# Patient Record
Sex: Female | Born: 1955 | Race: White | Hispanic: No | Marital: Married | Smoking: Former smoker
Health system: Southern US, Community
[De-identification: ages and names within clinical notes are randomized; demographics above are authoritative.]

## PROBLEM LIST (undated history)

## (undated) DIAGNOSIS — D649 Anemia, unspecified: Secondary | ICD-10-CM

## (undated) DIAGNOSIS — R0609 Other forms of dyspnea: Secondary | ICD-10-CM

## (undated) DIAGNOSIS — E785 Hyperlipidemia, unspecified: Secondary | ICD-10-CM

## (undated) DIAGNOSIS — F319 Bipolar disorder, unspecified: Secondary | ICD-10-CM

## (undated) DIAGNOSIS — C44201 Unspecified malignant neoplasm of skin of unspecified ear and external auricular canal: Secondary | ICD-10-CM

## (undated) DIAGNOSIS — M797 Fibromyalgia: Secondary | ICD-10-CM

## (undated) DIAGNOSIS — G2581 Restless legs syndrome: Secondary | ICD-10-CM

## (undated) DIAGNOSIS — N879 Dysplasia of cervix uteri, unspecified: Secondary | ICD-10-CM

## (undated) DIAGNOSIS — G4700 Insomnia, unspecified: Secondary | ICD-10-CM

## (undated) DIAGNOSIS — M1990 Unspecified osteoarthritis, unspecified site: Secondary | ICD-10-CM

## (undated) DIAGNOSIS — D759 Disease of blood and blood-forming organs, unspecified: Secondary | ICD-10-CM

## (undated) DIAGNOSIS — G629 Polyneuropathy, unspecified: Secondary | ICD-10-CM

## (undated) DIAGNOSIS — F329 Major depressive disorder, single episode, unspecified: Secondary | ICD-10-CM

## (undated) DIAGNOSIS — F419 Anxiety disorder, unspecified: Secondary | ICD-10-CM

## (undated) DIAGNOSIS — F411 Generalized anxiety disorder: Secondary | ICD-10-CM

## (undated) DIAGNOSIS — M545 Low back pain, unspecified: Secondary | ICD-10-CM

## (undated) DIAGNOSIS — I2510 Atherosclerotic heart disease of native coronary artery without angina pectoris: Secondary | ICD-10-CM

## (undated) DIAGNOSIS — E119 Type 2 diabetes mellitus without complications: Secondary | ICD-10-CM

## (undated) DIAGNOSIS — Z8489 Family history of other specified conditions: Secondary | ICD-10-CM

## (undated) DIAGNOSIS — N189 Chronic kidney disease, unspecified: Secondary | ICD-10-CM

## (undated) DIAGNOSIS — I10 Essential (primary) hypertension: Secondary | ICD-10-CM

## (undated) DIAGNOSIS — G894 Chronic pain syndrome: Secondary | ICD-10-CM

## (undated) DIAGNOSIS — F1190 Opioid use, unspecified, uncomplicated: Secondary | ICD-10-CM

## (undated) DIAGNOSIS — E039 Hypothyroidism, unspecified: Secondary | ICD-10-CM

## (undated) DIAGNOSIS — F418 Other specified anxiety disorders: Secondary | ICD-10-CM

## (undated) DIAGNOSIS — F32A Depression, unspecified: Secondary | ICD-10-CM

## (undated) DIAGNOSIS — E871 Hypo-osmolality and hyponatremia: Secondary | ICD-10-CM

## (undated) DIAGNOSIS — G43909 Migraine, unspecified, not intractable, without status migrainosus: Secondary | ICD-10-CM

## (undated) DIAGNOSIS — D6800 Von Willebrand disease, unspecified: Secondary | ICD-10-CM

## (undated) DIAGNOSIS — I209 Angina pectoris, unspecified: Secondary | ICD-10-CM

## (undated) DIAGNOSIS — R0600 Dyspnea, unspecified: Secondary | ICD-10-CM

## (undated) DIAGNOSIS — R160 Hepatomegaly, not elsewhere classified: Secondary | ICD-10-CM

## (undated) DIAGNOSIS — C801 Malignant (primary) neoplasm, unspecified: Secondary | ICD-10-CM

## (undated) DIAGNOSIS — J189 Pneumonia, unspecified organism: Secondary | ICD-10-CM

## (undated) DIAGNOSIS — Z794 Long term (current) use of insulin: Secondary | ICD-10-CM

## (undated) DIAGNOSIS — J449 Chronic obstructive pulmonary disease, unspecified: Secondary | ICD-10-CM

## (undated) DIAGNOSIS — K219 Gastro-esophageal reflux disease without esophagitis: Secondary | ICD-10-CM

## (undated) HISTORY — DX: Hepatomegaly, not elsewhere classified: R16.0

## (undated) HISTORY — DX: Von Willebrand disease, unspecified: D68.00

## (undated) HISTORY — DX: Major depressive disorder, single episode, unspecified: F32.9

## (undated) HISTORY — PX: BREAST IMPLANT REMOVAL: SUR1101

## (undated) HISTORY — DX: Essential (primary) hypertension: I10

## (undated) HISTORY — DX: Insomnia, unspecified: G47.00

## (undated) HISTORY — DX: Polyneuropathy, unspecified: G62.9

## (undated) HISTORY — DX: Depression, unspecified: F32.A

## (undated) HISTORY — PX: CARDIAC CATHETERIZATION: SHX172

## (undated) HISTORY — DX: Malignant (primary) neoplasm, unspecified: C80.1

## (undated) HISTORY — DX: Low back pain: M54.5

## (undated) HISTORY — DX: Migraine, unspecified, not intractable, without status migrainosus: G43.909

## (undated) HISTORY — DX: Other specified anxiety disorders: F41.8

## (undated) HISTORY — DX: Bipolar disorder, unspecified: F31.9

## (undated) HISTORY — PX: COLONOSCOPY: SHX174

## (undated) HISTORY — PX: LUMBAR FUSION: SHX111

## (undated) HISTORY — DX: Gastro-esophageal reflux disease without esophagitis: K21.9

## (undated) HISTORY — PX: CORONARY STENT PLACEMENT: SHX1402

## (undated) HISTORY — DX: Hyperlipidemia, unspecified: E78.5

## (undated) HISTORY — DX: Generalized anxiety disorder: F41.1

## (undated) HISTORY — DX: Anxiety disorder, unspecified: F41.9

## (undated) HISTORY — PX: ABDOMINAL HYSTERECTOMY: SHX81

## (undated) HISTORY — DX: Low back pain, unspecified: M54.50

## (undated) HISTORY — DX: Von Willebrand's disease: D68.0

## (undated) HISTORY — DX: Atherosclerotic heart disease of native coronary artery without angina pectoris: I25.10

---

## 1983-10-29 DIAGNOSIS — D649 Anemia, unspecified: Secondary | ICD-10-CM

## 1983-10-29 DIAGNOSIS — D500 Iron deficiency anemia secondary to blood loss (chronic): Secondary | ICD-10-CM

## 1983-10-29 HISTORY — PX: TOTAL ABDOMINAL HYSTERECTOMY W/ BILATERAL SALPINGOOPHORECTOMY: SHX83

## 1983-10-29 HISTORY — DX: Iron deficiency anemia secondary to blood loss (chronic): D50.0

## 1983-10-29 HISTORY — DX: Anemia, unspecified: D64.9

## 1985-10-28 HISTORY — PX: PLACEMENT OF BREAST IMPLANTS: SHX6334

## 1991-10-29 HISTORY — PX: BREAST IMPLANT REMOVAL: SUR1101

## 1995-10-29 HISTORY — PX: TURBINATE REDUCTION: SHX6157

## 1995-10-29 HISTORY — PX: NASAL SINUS SURGERY: SHX719

## 2004-07-16 ENCOUNTER — Other Ambulatory Visit: Payer: Self-pay

## 2004-07-17 ENCOUNTER — Other Ambulatory Visit: Payer: Self-pay

## 2004-10-18 ENCOUNTER — Ambulatory Visit: Payer: Self-pay | Admitting: Family Medicine

## 2004-11-01 ENCOUNTER — Ambulatory Visit: Payer: Self-pay | Admitting: Internal Medicine

## 2005-03-20 ENCOUNTER — Ambulatory Visit: Payer: Self-pay | Admitting: Pain Medicine

## 2005-04-01 ENCOUNTER — Ambulatory Visit: Payer: Self-pay | Admitting: Pain Medicine

## 2005-04-24 ENCOUNTER — Ambulatory Visit: Payer: Self-pay | Admitting: Family Medicine

## 2005-05-07 ENCOUNTER — Ambulatory Visit: Payer: Self-pay | Admitting: Pain Medicine

## 2005-05-15 ENCOUNTER — Ambulatory Visit: Payer: Self-pay | Admitting: Pain Medicine

## 2005-07-25 ENCOUNTER — Ambulatory Visit: Payer: Self-pay | Admitting: Pain Medicine

## 2005-07-31 ENCOUNTER — Ambulatory Visit: Payer: Self-pay | Admitting: Pain Medicine

## 2005-09-10 ENCOUNTER — Ambulatory Visit: Payer: Self-pay | Admitting: Pain Medicine

## 2005-10-24 ENCOUNTER — Ambulatory Visit: Payer: Self-pay | Admitting: Physician Assistant

## 2005-10-28 HISTORY — PX: LUMBAR FUSION: SHX111

## 2005-10-28 HISTORY — PX: BACK SURGERY: SHX140

## 2005-10-28 HISTORY — PX: CORONARY ANGIOPLASTY WITH STENT PLACEMENT: SHX49

## 2006-01-15 ENCOUNTER — Other Ambulatory Visit: Payer: Self-pay

## 2006-01-23 ENCOUNTER — Inpatient Hospital Stay: Payer: Self-pay | Admitting: Unknown Physician Specialty

## 2006-02-06 ENCOUNTER — Emergency Department: Payer: Self-pay | Admitting: Emergency Medicine

## 2006-08-25 ENCOUNTER — Ambulatory Visit: Payer: Self-pay | Admitting: Internal Medicine

## 2006-09-01 ENCOUNTER — Inpatient Hospital Stay (HOSPITAL_COMMUNITY): Admission: RE | Admit: 2006-09-01 | Discharge: 2006-09-02 | Payer: Self-pay | Admitting: Cardiology

## 2006-09-03 ENCOUNTER — Ambulatory Visit: Payer: Self-pay | Admitting: Internal Medicine

## 2006-09-27 ENCOUNTER — Ambulatory Visit: Payer: Self-pay | Admitting: Internal Medicine

## 2006-11-13 ENCOUNTER — Ambulatory Visit: Payer: Self-pay | Admitting: Unknown Physician Specialty

## 2007-03-12 ENCOUNTER — Encounter: Payer: Self-pay | Admitting: Cardiovascular Disease

## 2007-03-13 ENCOUNTER — Encounter: Payer: Self-pay | Admitting: Cardiovascular Disease

## 2007-06-19 ENCOUNTER — Ambulatory Visit: Payer: Self-pay | Admitting: Physician Assistant

## 2007-08-06 ENCOUNTER — Ambulatory Visit: Payer: Self-pay | Admitting: Pain Medicine

## 2007-09-07 ENCOUNTER — Ambulatory Visit: Payer: Self-pay | Admitting: Pain Medicine

## 2007-10-15 ENCOUNTER — Ambulatory Visit: Payer: Self-pay | Admitting: Pain Medicine

## 2007-11-02 ENCOUNTER — Ambulatory Visit: Payer: Self-pay | Admitting: Pain Medicine

## 2007-12-02 ENCOUNTER — Ambulatory Visit: Payer: Self-pay | Admitting: Pain Medicine

## 2007-12-09 ENCOUNTER — Ambulatory Visit: Payer: Self-pay | Admitting: Pain Medicine

## 2007-12-11 ENCOUNTER — Ambulatory Visit: Payer: Self-pay | Admitting: Pain Medicine

## 2007-12-29 ENCOUNTER — Ambulatory Visit: Payer: Self-pay | Admitting: Pain Medicine

## 2008-01-06 ENCOUNTER — Ambulatory Visit: Payer: Self-pay | Admitting: Pain Medicine

## 2008-01-28 ENCOUNTER — Ambulatory Visit: Payer: Self-pay | Admitting: Pain Medicine

## 2008-07-25 ENCOUNTER — Ambulatory Visit: Payer: Self-pay | Admitting: Physician Assistant

## 2008-09-02 ENCOUNTER — Ambulatory Visit: Payer: Self-pay | Admitting: Family Medicine

## 2008-09-20 ENCOUNTER — Ambulatory Visit: Payer: Self-pay | Admitting: Specialist

## 2009-01-11 ENCOUNTER — Encounter: Payer: Self-pay | Admitting: Cardiovascular Disease

## 2009-01-11 ENCOUNTER — Ambulatory Visit (HOSPITAL_COMMUNITY): Admission: RE | Admit: 2009-01-11 | Discharge: 2009-01-11 | Payer: Self-pay | Admitting: Cardiovascular Disease

## 2009-07-06 ENCOUNTER — Encounter: Payer: Self-pay | Admitting: Cardiovascular Disease

## 2010-06-13 ENCOUNTER — Ambulatory Visit: Payer: Self-pay | Admitting: Cardiovascular Disease

## 2010-06-13 DIAGNOSIS — I2510 Atherosclerotic heart disease of native coronary artery without angina pectoris: Secondary | ICD-10-CM

## 2010-06-13 DIAGNOSIS — R079 Chest pain, unspecified: Secondary | ICD-10-CM

## 2010-06-13 DIAGNOSIS — E785 Hyperlipidemia, unspecified: Secondary | ICD-10-CM

## 2010-06-14 ENCOUNTER — Encounter: Payer: Self-pay | Admitting: Cardiovascular Disease

## 2010-06-14 DIAGNOSIS — F17200 Nicotine dependence, unspecified, uncomplicated: Secondary | ICD-10-CM | POA: Insufficient documentation

## 2010-06-18 ENCOUNTER — Ambulatory Visit: Payer: Self-pay | Admitting: Cardiovascular Disease

## 2010-06-19 ENCOUNTER — Encounter: Payer: Self-pay | Admitting: Cardiovascular Disease

## 2010-06-19 LAB — CONVERTED CEMR LAB
BUN: 14 mg/dL (ref 6–23)
Basophils Absolute: 0 10*3/uL (ref 0.0–0.1)
Basophils Relative: 0 % (ref 0–1)
CO2: 27 meq/L (ref 19–32)
Chloride: 98 meq/L (ref 96–112)
Eosinophils Absolute: 0.2 10*3/uL (ref 0.0–0.7)
Glucose, Bld: 185 mg/dL — ABNORMAL HIGH (ref 70–99)
HCT: 44 % (ref 36.0–46.0)
Hemoglobin: 13.8 g/dL (ref 12.0–15.0)
INR: 0.95 (ref <1.50)
Lymphocytes Relative: 25 % (ref 12–46)
MCV: 94.2 fL (ref 78.0–100.0)
Potassium: 4.7 meq/L (ref 3.5–5.3)
RBC: 4.67 M/uL (ref 3.87–5.11)
Sodium: 137 meq/L (ref 135–145)
WBC: 7.6 10*3/uL (ref 4.0–10.5)
aPTT: 30 s (ref 24–37)

## 2010-06-20 ENCOUNTER — Ambulatory Visit: Payer: Self-pay | Admitting: Cardiovascular Disease

## 2010-06-20 HISTORY — PX: CORONARY ANGIOPLASTY WITH STENT PLACEMENT: SHX49

## 2010-06-21 ENCOUNTER — Encounter: Payer: Self-pay | Admitting: Cardiovascular Disease

## 2010-06-25 ENCOUNTER — Telehealth: Payer: Self-pay | Admitting: Cardiovascular Disease

## 2010-06-28 ENCOUNTER — Telehealth: Payer: Self-pay | Admitting: Cardiovascular Disease

## 2010-07-05 ENCOUNTER — Encounter: Payer: Self-pay | Admitting: Cardiovascular Disease

## 2010-07-19 ENCOUNTER — Ambulatory Visit: Payer: Self-pay | Admitting: Cardiovascular Disease

## 2010-07-25 ENCOUNTER — Encounter: Payer: Self-pay | Admitting: Cardiovascular Disease

## 2010-10-26 ENCOUNTER — Ambulatory Visit: Payer: Self-pay | Admitting: Family Medicine

## 2010-10-28 ENCOUNTER — Ambulatory Visit: Payer: Self-pay | Admitting: Family Medicine

## 2010-11-27 NOTE — Letter (Signed)
Summary: PHI  PHI   Imported By: Harlon Flor 06/14/2010 16:09:45  _____________________________________________________________________  External Attachment:    Type:   Image     Comment:   External Document

## 2010-11-27 NOTE — Miscellaneous (Signed)
Summary: Heart Track  Heart Track   Imported By: Harlon Flor 07/16/2010 13:23:35  _____________________________________________________________________  External Attachment:    Type:   Image     Comment:   External Document

## 2010-11-27 NOTE — Cardiovascular Report (Signed)
[facility]   Imported By: Harlon Flor 06/20/2010 16:35:46  _____________________________________________________________________  External Attachment:    Type:   Image     Comment:   External Document

## 2010-11-27 NOTE — Cardiovascular Report (Signed)
Summary: Consent Form  Consent Form   Imported By: Harlon Flor 07/16/2010 13:22:34  _____________________________________________________________________  External Attachment:    Type:   Image     Comment:   External Document

## 2010-11-27 NOTE — Letter (Signed)
Summary: [facility] - Cardiac Cath  [facility] - Cardiac Cath   Imported By: Marylou Mccoy 08/21/2010 13:36:03  _____________________________________________________________________  External Attachment:    Type:   Image     Comment:   External Document

## 2010-11-27 NOTE — Letter (Signed)
Summary: [facility] - No-Show Fax  [facility] - No-Show Fax   Imported By: Marylou Mccoy 08/08/2010 10:44:33  _____________________________________________________________________  External Attachment:    Type:   Image     Comment:   External Document

## 2010-11-27 NOTE — Assessment & Plan Note (Signed)
Summary: NP6/AMD   Visit Type:  Initial Consult Primary Jennifer [name]:  Jennifer [name].  CC:  c/o chest pain and shortness of breath and dizziness. She states had a spell about two weeks ago of  tightening in her chest while driving the car.  She also had experienced some short term memory loss and slurred speech; noticed about one year ago. Marland Kitchen  History of Present Illness: 55 year old woman with a history of coronary artery disease, PTCA of the LAD with 2.5 x 28 mm Cypher stent in January 2007, residual 50% LAD disease and ostial diagonal disease seen on cardiac catheterization in March 2010 also a 30% RCA disease proximally, long smoking history who continues to smoke, hyperlipidemia, hypertension who presents to establish care. She was last seen at [facility] in March 2010.   she reports that she has been having significant chest pain. It is similar to her previous episodes of chest pain when she had her LAD disease. She had an episode several weeks ago and  one 2 weeks ago, described as severe. She did not take NTG as she was afraid of getting a headache. She continues to smoke.  EKG shows NSR with no significant ST or T wave changes.  stress test in May 2008 showing no ischemia, ejection fraction 57%, exercised for 10 minutes with no EKG changes.  Echocardiogram May 2008 showing mild LVH, normal LV systolic function with diastolic dysfunction noted  Cholesterol in September 2010 showed total cholesterol 203, unable to calculate LDL, HDL 26  Preventive Screening-Counseling & Management  Caffeine-Diet-Exercise     Does Patient Exercise: yes  Current Medications (verified): 1)  Amlodipine Besylate 10 Mg Tabs (Amlodipine Besylate) .Marland Kitchen.. 1 Tablet Once Daily 2)  Tricor 145 Mg Tabs (Fenofibrate) .Marland Kitchen.. 1 Tablet Once Daily 3)  Cymbalta 60 Mg Cpep (Duloxetine Hcl) .... Two Tablets Once Daily 4)  Metformin Hcl 1000 Mg Tabs (Metformin Hcl) .Marland Kitchen.. 1 Tablet Two Times A Day 5)   Lamotrigine 200 Mg Tabs (Lamotrigine) .Marland Kitchen.. 1 Tablet Once Daily 6)  Coreg 12.5 Mg Tabs (Carvedilol) .... One Tablet Two Times A Day 7)  Simvastatin 40 Mg Tabs (Simvastatin) .... Take One Tablet By Mouth Daily At Bedtime 8)  Micardis Hct 80-12.5 Mg Tabs (Telmisartan-Hctz) .... One Tablet Once Daily 9)  Lortab 7.5-500 Mg Tabs (Hydrocodone-Acetaminophen) .... Four Tablets Daily 10)  Symbicort 160-4.5 Mcg/act Aero (Budesonide-Formoterol Fumarate) .... One Tablet As Needed 11)  Combivent 18-103 Mcg/act Aero (Ipratropium-Albuterol) 12)  Seroquel Xr 300 Mg Xr24h-Tab (Quetiapine Fumarate) .Marland Kitchen.. 1 Tablet At Bedtime 13)  Omeprazole 40 Mg Cpdr (Omeprazole) .... One Tablet Two Times A Day  Allergies (verified): No Known Drug Allergies  Family History: Father: AAA Brother: AAA Family History of Hypertension:  Family History of Hyperlipidemia:  Family History of Diabetes: Mother & Father  Social History: Tobacco Use - Yes.  Married  Alcohol Use - no Regular Exercise - yes Part Time -- takes care of grandchildren Does Patient Exercise:  yes  Review of Systems       The patient complains of chest pain.  The patient denies fever, weight loss, weight gain, vision loss, decreased hearing, hoarseness, syncope, dyspnea on exertion, peripheral edema, prolonged cough, abdominal pain, incontinence, muscle weakness, depression, and enlarged lymph nodes.    Vital Signs:  Patient profile:   55 year old female Height:      63 inches Weight:      152.75 pounds BMI:     27.16 Pulse rate:  99 / minute BP sitting:   152 / 98  (left arm) Cuff size:   regular  Vitals Entered By: Jennifer [name], CMA (June 13, 2010 2:59 PM)  Physical Exam  General:  Well developed, well nourished, in no acute distress. Head:  normocephalic and atraumatic Neck:  Neck supple, no JVD. No masses, thyromegaly or abnormal cervical nodes. Lungs:  Clear bilaterally to auscultation and percussion. Heart:  Non-displaced PMI,  chest non-tender; regular rate and rhythm, S1, S2 without murmurs, rubs or gallops. Carotid upstroke normal, no bruit. Pedals normal pulses. No edema, no varicosities. Abdomen:  abdomen soft and non-tender without masses Msk:  Back normal, normal gait. Muscle strength and tone normal. Pulses:  pulses normal in all 4 extremities Extremities:  No clubbing or cyanosis. Neurologic:  Alert and oriented x 3. Skin:  Intact without lesions or rashes. Psych:  Normal affect.   Impression & Recommendations:  Problem # 1:  CAD (ICD-414.00) Symptoms recently are concerning for angina. She has significant CAD at the takeoff of the diaginal branch that was noted 12/2008. We have recommended a cardiac cath to evsluste this lesion as a cause of her symptoms of chest pain.  Needs smoking cessation. This was discussed with her.  Continue asa, statin, b-blocker. BP is in good control. Will aim for cath next week when she is available, after her husbands knee surgery. Her updated medication list for this problem includes:    Amlodipine Besylate 10 Mg Tabs (Amlodipine besylate) .Marland Kitchen... 1 tablet once daily    Coreg 12.5 Mg Tabs (Carvedilol) ..... One tablet two times a day  Orders: EKG w/ Interpretation (93000)  Problem # 2:  HYPERLIPIDEMIA-MIXED (ICD-272.4) history of hyperlipidemia, we will try to obtain her most recent lipid panel from Dr. Artis [name].  Her updated medication list for this problem includes:    Tricor 145 Mg Tabs (Fenofibrate) .Marland Kitchen... 1 tablet once daily    Simvastatin 40 Mg Tabs (Simvastatin) .Marland Kitchen... Take one tablet by mouth daily at bedtime  Problem # 3:  SMOKER (ICD-305.1) she continues to smoke despite her coronary artery disease. We have talked to her about stopping. This will be an important part of her medical management.  Patient Instructions: 1)  Your physician recommends that you schedule a follow-up appointment in: Will schedule after heart catherization 2)  Your physician has  requested that you have a cardiac catheterization.  Cardiac catheterization is used to diagnose and/or treat various heart conditions. Doctors may recommend this procedure for a number of different reasons. The most common reason is to evaluate chest pain. Chest pain can be a symptom of coronary artery disease (CAD), and cardiac catheterization can show whether plaque is narrowing or blocking your heart's arteries. This procedure is also used to evaluate the valves, as well as measure the blood flow and oxygen levels in different parts of your heart.  For further information please visit [email or web address]  Please follow instruction sheet, as given.

## 2010-11-27 NOTE — Cardiovascular Report (Signed)
Summary: Cardiac Cath Other  Cardiac Cath Other   Imported By: Harlon Flor 06/14/2010 15:57:16  _____________________________________________________________________  External Attachment:    Type:   Image     Comment:   External Document

## 2010-11-27 NOTE — Progress Notes (Signed)
Summary: SAMPLES  Phone Note Call from Patient Call back at Home Phone [phone]   Caller: SELF Call For: [facility] Summary of Call: WOULD LIKE MORE SAMPLES-THEY WERE STOLEN OUT OF HER CAR LAST WEEK Initial call taken by: Harlon Flor,  June 25, 2010 10:38 AM  Follow-up for Phone Call        Samples left at front desk pt is aware.  Follow-up by: Benedict Needy, RN,  June 25, 2010 10:57 AM    Prescriptions: EFFIENT 10 MG TABS (PRASUGREL HCL) one tablet once daily  #14 x 0   Entered by:   Benedict Needy, RN   Authorized by:   Dossie Arbour MD   Signed by:   Benedict Needy, RN on 06/25/2010   Method used:   Samples Given   RxID:   [id number]

## 2010-11-27 NOTE — Miscellaneous (Signed)
Summary: CXR  Clinical Lists Changes  Orders: Added new Test order of T-1 View CXR (71010TC) - Signed

## 2010-11-27 NOTE — Assessment & Plan Note (Signed)
Summary: POST HOSPITAL F/U FROM CATH   Visit Type:  Follow-up Primary Provider:  Karsten Fells.  CC:  F/U [facility] s/p stent.  c/o feeling really fatigue and tired with some right sided chest pain.Marland Kitchen  History of Present Illness: 55 year old woman with a history of coronary artery disease, PTCA of the LAD with 2.5 x 28 mm Cypher stent in January 2007, residual 50% LAD disease and ostial diagonal disease seen on cardiac catheterization in March 2010 also a 30% RCA disease proximally, long smoking history, hyperlipidemia, hypertension, who presented to the hospital last month with chest pain. She had a cardiac catheterization showing severe mid LAD disease estimated at 90% after the Diagonal vessel. A DES stent was placed. She presents for routine followup.  She reports that she has no episodes of chest pain though she is very fatigued. She wonders if it might be her blood pressure or her depression. She has not been exercising. She has been taking her medications with no problems. She stopped smoking last month.  EKG shows normal sinus rhythm with rate 79 beats per minute, no significant ST or T wave changes.  stress test in May 2008 showing no ischemia, ejection fraction 57%, exercised for 10 minutes with no EKG changes.  Echocardiogram May 2008 showing mild LVH, normal LV systolic function with diastolic dysfunction noted  Cholesterol in September 2010 showed total cholesterol 203, unable to calculate LDL, HDL 26  Current Medications (verified): 1)  Amlodipine Besylate 10 Mg Tabs (Amlodipine Besylate) .Marland Kitchen.. 1 Tablet Once Daily 2)  Tricor 145 Mg Tabs (Fenofibrate) .Marland Kitchen.. 1 Tablet Once Daily 3)  Cymbalta 60 Mg Cpep (Duloxetine Hcl) .... Two Tablets Once Daily 4)  Metformin Hcl 1000 Mg Tabs (Metformin Hcl) .Marland Kitchen.. 1 Tablet Two Times A Day 5)  Lamotrigine 200 Mg Tabs (Lamotrigine) .Marland Kitchen.. 1 Tablet Once Daily 6)  Coreg 12.5 Mg Tabs (Carvedilol) .... One Tablet Two Times A Day 7)  Simvastatin 40 Mg Tabs  (Simvastatin) .... Take One Tablet By Mouth Daily At Bedtime 8)  Micardis Hct 80-12.5 Mg Tabs (Telmisartan-Hctz) .... One Tablet Once Daily 9)  Lortab 7.5-500 Mg Tabs (Hydrocodone-Acetaminophen) .... Four Tablets Daily 10)  Symbicort 160-4.5 Mcg/act Aero (Budesonide-Formoterol Fumarate) .... One Tablet As Needed 11)  Combivent 18-103 Mcg/act Aero (Ipratropium-Albuterol) 12)  Seroquel Xr 300 Mg Xr24h-Tab (Quetiapine Fumarate) .Marland Kitchen.. 1 Tablet At Bedtime 13)  Omeprazole 40 Mg Cpdr (Omeprazole) .... One Tablet Two Times A Day 14)  Effient 10 Mg Tabs (Prasugrel Hcl) .... One Tablet Once Daily 15)  Prozac 10 Mg Caps (Fluoxetine Hcl) .... One Tablet Once Daily 16)  Ativan 1 Mg Tabs (Lorazepam) .... One Tablet As Needed  Allergies (verified): No Known Drug Allergies  Past History:  Family History: Last updated: 06/13/2010 Father: AAA Brother: AAA Family History of Hypertension:  Family History of Hyperlipidemia:  Family History of Diabetes: Mother & Father  Social History: Last updated: 06/13/2010 Tobacco Use - Yes.  Married  Alcohol Use - no Regular Exercise - yes Part Time -- takes care of grandchildren  Risk Factors: Exercise: yes (06/13/2010)  Risk Factors: Smoking Status: current (06/13/2010)  Past Medical History: CAD GERD Hyperlipidemia Hypertension enlarged liver  Past Surgical History: CAD, PCI of LAD; Cypher stent 2.5 x 28 mm PCI of mid-LAD in -stent restenosis with a drug-eluting stent  Review of Systems  The patient denies fever, weight loss, weight gain, vision loss, decreased hearing, hoarseness, chest pain, syncope, dyspnea on exertion, peripheral edema, prolonged cough, abdominal pain, incontinence, muscle  weakness, depression, and enlarged lymph nodes.         Fatigue  Vital Signs:  Patient profile:   55 year old female Height:      63 inches Weight:      156 pounds BMI:     27.73 Pulse rate:   79 / minute BP sitting:   110 / 78  (left arm) Cuff  size:   regular  Vitals Entered By: Bishop Dublin, CMA (July 19, 2010 11:43 AM)  Physical Exam  General:  Well developed, well nourished, in no acute distress. Head:  normocephalic and atraumatic Neck:  Neck supple, no JVD. No masses, thyromegaly or abnormal cervical nodes. Lungs:  Clear bilaterally to auscultation and percussion. Heart:  Non-displaced PMI, chest non-tender; regular rate and rhythm, S1, S2 without murmurs, rubs or gallops. Carotid upstroke normal, no bruit. Pedals normal pulses. No edema, no varicosities. Abdomen:  abdomen soft and non-tender without masses Msk:  Back normal, normal gait. Muscle strength and tone normal. Pulses:  pulses normal in all 4 extremities Extremities:  No clubbing or cyanosis. Neurologic:  Alert and oriented x 3. Skin:  Intact without lesions or rashes. Psych:  Normal affect.   Impression & Recommendations:  Problem # 1:  CAD (ICD-414.00) recent stent to her LAD with no further chest pain. Moderate proximal LAD and ostial diagonal disease which is unchanged from 2009, long region of moderate disease in the proximal to mid RCA. I'm glad that she has stopped smoking and we will continue aggressive lipid management.  Her updated medication list for this problem includes:    Amlodipine Besylate 10 Mg Tabs (Amlodipine besylate) .Marland Kitchen... 1 tablet once daily    Coreg 12.5 Mg Tabs (Carvedilol) ..... One tablet two times a day    Effient 10 Mg Tabs (Prasugrel hcl) .Marland Kitchen... Take 1 tablet by mouth once a day  Orders: EKG w/ Interpretation (93000)  Problem # 2:  HYPERLIPIDEMIA-MIXED (ICD-272.4) I've encouraged her to stay on her simvastatin. Goal LDL is less than 70  Her updated medication list for this problem includes:    Tricor 145 Mg Tabs (Fenofibrate) .Marland Kitchen... 1 tablet once daily    Simvastatin 40 Mg Tabs (Simvastatin) .Marland Kitchen... Take one tablet by mouth daily at bedtime  Problem # 3:  SMOKER (ICD-305.1) She quit smoking one month ago. I have  encouraged her on her efforts.  Patient Instructions: 1)  Your physician has recommended you make the following change in your medication: DECREASE micardis 1/2 tab daily  2)  Your physician wants you to follow-up in:    6 months You will receive a reminder letter in the mail two months in advance. If you don't receive a letter, please call our office to schedule the follow-up appointment. Prescriptions: EFFIENT 10 MG TABS (PRASUGREL HCL) Take 1 tablet by mouth once a day  #30 x 12   Entered by:   Benedict Needy, RN   Authorized by:   Dossie Arbour MD   Signed by:   Benedict Needy, RN on 07/19/2010   Method used:   Electronically to        [facility]  [address] (retail)       [address], [address]       [place], [place]  [address]       Ph: [phone]       Fax: [phone]   RxID:   [id number]   Appended Document: POST HOSPITAL F/U  FROM CATH her blood pressure was borderline low. Given that she is petite, I suggested she could decrease her my card is in half and closely monitor her blood pressure. I suspect her fatigue is due to depression.

## 2010-11-27 NOTE — Miscellaneous (Signed)
Summary: new med s/p [facility] effient  Clinical Lists Changes  Medications: Added new medication of EFFIENT 10 MG TABS (PRASUGREL HCL) one tablet once daily

## 2010-11-27 NOTE — Cardiovascular Report (Signed)
Summary: Orders  Orders   Imported By: Harlon Flor 07/16/2010 13:22:56  _____________________________________________________________________  External Attachment:    Type:   Image     Comment:   External Document

## 2010-11-27 NOTE — Progress Notes (Signed)
Summary: PROBLEMS  Phone Note Call from Patient Call back at Home Phone [phone]   Caller: SELF Call For: [facility] Summary of Call: PT IS STILL HAVING DULL ACHING PAIN SINCE HAVING A STENT PUT IN-PAIN ON RIGHT ARM BETWEEN WRIST AND ELBOW-PT STATES THAT SHE IS TIRED ALL OF THE TIME AND DOES NOT HAVE ENERGY Initial call taken by: Harlon Flor,  June 28, 2010 11:58 AM  Follow-up for Phone Call        Pt c/o fatigue, pain from her wrist to elbow and bloating. Pt has not seen Dr. Artis Flock her PCP about any of these problems. She wanted Dr. Mariah Milling to refer her to GI doc.  I asked her to see Dr. Artis Flock for this referal.  Follow-up by: Benedict Needy, RN,  June 28, 2010 1:22 PM

## 2010-11-28 ENCOUNTER — Ambulatory Visit: Payer: Self-pay | Admitting: Family Medicine

## 2010-12-27 ENCOUNTER — Ambulatory Visit: Payer: Self-pay | Admitting: Family Medicine

## 2010-12-27 ENCOUNTER — Telehealth: Payer: Self-pay | Admitting: Nurse Practitioner

## 2010-12-28 ENCOUNTER — Observation Stay: Payer: Self-pay | Admitting: Internal Medicine

## 2010-12-28 DIAGNOSIS — R079 Chest pain, unspecified: Secondary | ICD-10-CM

## 2011-01-03 NOTE — Progress Notes (Signed)
Summary: Cardiology Phone Note - Chest Pain  Phone Note Call from Patient   Caller: Patient Summary of Call: pt called reporting c/p starting earlier this evening and minimally improved with ntg.  c/p similar to prior angina.  i rec. that she present to ED.  she's going to [facility], which is closest. Initial call taken by: Creig Hines, ANP-BC,  December 27, 2010 7:46 PM

## 2011-01-27 ENCOUNTER — Ambulatory Visit: Payer: Self-pay | Admitting: Family Medicine

## 2011-01-31 ENCOUNTER — Encounter: Payer: Self-pay | Admitting: Cardiovascular Disease

## 2011-01-31 ENCOUNTER — Ambulatory Visit (INDEPENDENT_AMBULATORY_CARE_PROVIDER_SITE_OTHER): Admitting: Cardiovascular Disease

## 2011-01-31 DIAGNOSIS — F17200 Nicotine dependence, unspecified, uncomplicated: Secondary | ICD-10-CM

## 2011-01-31 DIAGNOSIS — I2510 Atherosclerotic heart disease of native coronary artery without angina pectoris: Secondary | ICD-10-CM

## 2011-01-31 DIAGNOSIS — I10 Essential (primary) hypertension: Secondary | ICD-10-CM

## 2011-01-31 DIAGNOSIS — E785 Hyperlipidemia, unspecified: Secondary | ICD-10-CM

## 2011-01-31 DIAGNOSIS — R079 Chest pain, unspecified: Secondary | ICD-10-CM

## 2011-01-31 MED ORDER — ISOSORBIDE MONONITRATE ER 30 MG PO TB24: 30.0000 mg | ORAL_TABLET | Freq: Every day | ORAL | Status: DC

## 2011-01-31 MED ORDER — AMLODIPINE BESYLATE 10 MG PO TABS: 5.0000 mg | ORAL_TABLET | Freq: Every day | ORAL | Status: DC

## 2011-01-31 NOTE — Assessment & Plan Note (Signed)
She does have moderate three-vessel disease, recent stent placement to her LAD. She does have chronic chest pain. Recent admission to the hospital for rule out MI. Everything at that time looked normal and symptoms consistent with anxiety. Continues to have chest pain symptoms.

## 2011-01-31 NOTE — Assessment & Plan Note (Signed)
Etiology of her chest pain is likely secondary to underlying anxiety and stress. She does have underlying coronary artery disease though she did have a cardiac catheterization performed August of last year. She is not interested in a stress test. We have suggested we start isosorbide mononitrate for her chest pain symptoms. She will decrease her amlodipine to 1/2 a tab. We have suggested she talk with her psychiatrist and Dr. Evelene Croon about a better medication regimen for her anxiety. If she continues to have chest pain, we may need to repeat a cardiac catheterization.

## 2011-01-31 NOTE — Assessment & Plan Note (Signed)
Continue current medication regimen. Goal LDL less than 70.

## 2011-01-31 NOTE — Patient Instructions (Addendum)
We would like you to decreased your amlodipine to 5 mg daily (cut in 1/2) Start isosorbide mononitrate (imdur) one tab a day. This is for chest pain. If you continue to have chest pain, contact our office. We will call you for follow up. Please discuss your anxiety/stress medications with you other doctors.

## 2011-01-31 NOTE — Progress Notes (Signed)
   Patient ID: Jennifer [name], female    DOB: [birth year], 55 y.o.   MRN: [id number]  HPI Comments: 55 year old woman with a history of coronary artery disease, PTCA of the LAD with 2.5 x 28 mm Cypher stent in January 2007, Repeat catheterization August 2011 with Stent placement of the mid LAD for 90% lesion, Who presents for routine followup and for evaluation of recurrent chest pain. She does have severe underlying anxiety and and was recently in the hospital for atypical type chest pain on December 28 2010.  She reports that her stress is very elevated at home. She was started on Valium 5 mg daily by her primary care physician, Dr. Sheppard Penton who she states this is not helping to any significant degree as she likely has a tolerance. She has several families living with her. She does see a psychiatrist in [place], Dr. Roselle Locus of [facility].   She is able to take the new grandbaby for stools/walks with a stroller with no significant chest pain. Chest pain comes on sometimes at rest, sometimes with exertion, is sharp. It seems to be more associated with stress.  Echocardiogram May 2008 showing mild LVH, normal LV systolic function with diastolic dysfunction noted  Cardiac catheter August 2011 showing diffuse 50% disease of the proximal LAD before the D1, 90% mid LAD disease after D1 at the distal margin of the stent, 60% ostial and other D1, diffuse 50% of the proximal RCA.  EKG shows normal sinus rhythm with rate 86 beats a minute with no significant ST or T wave changes     Review of Systems  Constitutional: Negative.   HENT: Negative.   Eyes: Negative.   Respiratory: Negative.   Cardiovascular: Positive for chest pain.  Gastrointestinal: Negative.   Musculoskeletal: Negative.   Skin: Negative.   Neurological: Negative.   Hematological: Negative.   Psychiatric/Behavioral: Positive for sleep disturbance. The patient is nervous/anxious.   All other systems reviewed and are negative.   BP  128/72  Pulse 86  Ht 5\' 3"  (1.6 m)  Wt 163 lb 6.4 oz (74.118 kg)  BMI 28.95 kg/m2   Physical Exam  Nursing note and vitals reviewed. Constitutional: She is oriented to person, place, and time. She appears well-developed and well-nourished.  HENT:  Head: Normocephalic.  Nose: Nose normal.  Mouth/Throat: Oropharynx is clear and moist.  Eyes: Conjunctivae are normal. Pupils are equal, round, and reactive to light.  Neck: Normal range of motion. Neck supple. No JVD present.  Cardiovascular: Normal rate, regular rhythm, normal heart sounds and intact distal pulses.  Exam reveals no gallop and no friction rub.   No murmur heard. Pulmonary/Chest: Effort normal and breath sounds normal. No respiratory distress. She has no wheezes. She has no rales. She exhibits no tenderness.  Abdominal: Soft. Bowel sounds are normal. She exhibits no distension. There is no tenderness.  Musculoskeletal: Normal range of motion. She exhibits no edema and no tenderness.  Lymphadenopathy:    She has no cervical adenopathy.  Neurological: She is alert and oriented to person, place, and time. Coordination normal.  Skin: Skin is warm and dry. No rash noted. No erythema.  Psychiatric: She has a normal mood and affect. Her behavior is normal. Judgment and thought content normal.         Assessment and Plan

## 2011-01-31 NOTE — Assessment & Plan Note (Signed)
She did stop smoking last August. We have congratulated her on this.

## 2011-01-31 NOTE — Assessment & Plan Note (Signed)
Blood pressure is well controlled on her current medication regimen. We will decrease her amlodipine and start isosorbide mononitrate 30 mg daily.

## 2011-02-07 LAB — GLUCOSE, CAPILLARY

## 2011-02-20 ENCOUNTER — Encounter: Payer: Self-pay | Admitting: Cardiovascular Disease

## 2011-03-12 NOTE — Cardiovascular Report (Signed)
[name], [name]              ACCOUNT NO.:  [id number]   MEDICAL RECORD NO.:  [id number]          PATIENT TYPE:  OIB   LOCATION:  2899                         FACILITY:  [facility]   PHYSICIAN:  Antonieta Iba, MD   DATE OF BIRTH:  09-06-1956   DATE OF PROCEDURE:  DATE OF DISCHARGE:  01/11/2009                            CARDIAC CATHETERIZATION   Physician performing the procedure is Dr. Julien Nordmann.   REASON FOR PROCEDURE:  Jennifer [name] is a very pleasant 55 year old woman  with a history of coronary artery disease with stent placed to her  proximal LAD in January of 2007 (2.5 x 28 mm Cypher stent), and at that  time was noted to have 60% residual stenosis of the OM1, RCA as well as  some residual LAD disease.  She has had worsening chest pain, night  sweats, decreased fatigue over the past several weeks to months.  She  presents for further evaluation for coronary anatomy and evaluation for  in-stent restenosis.   PROCEDURE DETAILS:  The procedure was discussed, and risks and benefits  explained and consent was obtained.  The patient was brought to the  cardiac catheterization lab and prepped and draped in the usual sterile  fashion.  The modified Seldinger technique was used to engage the right  femoral artery.  A 6-French Judkins left 4 and right 4 catheter were  used to engage the left main and RCA respectively.  Injection of a  contrast was used and cinematography recorded to detail the coronary  anatomy.  A pigtail catheter was used to cross the aortic valve into the  left ventricle to obtain a left ventriculogram and pullback was  performed to obtain a gradient across the aortic valve.  Catheters were  removed at the end of the case including sheet and manual pressure held  and hemostasis obtained.  No complications were reported at the time of  this dictation.   CORONARY ANATOMY:  Left main:  Left main is a moderate-to-large sized  vessel that bifurcates into the LAD  and left circumflex.  There is no  significant disease noted.   LEFT ANTERIOR DESCENDING:  The LAD is a moderate-to-large sized vessel  that extents around to the apex.  There is a stent placed in the prox to  mid region, that has no significant in-stent restenosis.  There is a  short tubular lesion prior to the proximal region of a stent of  approximately 50%.  There is also some ostial D1 disease.  The diagonal  takes off prior to the proximal region of the stent.  This ostial lesion  is likely about 50-60%.  Otherwise, there is no significant disease  noted apart from some mild luminal irregularities in the mid-to-distal  LAD and the diagonal vessel.   LEFT CIRCUMFLEX:  The left circumflex is a moderate-to-large sized  vessel and has at least 2 moderate sized obtuse marginal branches.  There are mild luminal irregularities noted in the OM1 as well as OM2.  Otherwise, there is no significant disease noted.   RIGHT CORONARY ARTERY:  The RCA is a moderate-sized  vessel that has PD  and PL branch distally.  There is a mild luminal regularities estimated  at 30% in the proximal region of the RCA.  Otherwise, no significant  stenosis noted.   Normal LV function on LV gram.  No significant aortic stenosis noted on  pullback.  No significant mitral regurgitation noted on LV gram.   In summary, Jennifer [name] has a patent stent in her proximal-to-mid LAD.  She does have moderate disease noted in a short stretch in a tubular  lesion prior to the proximal stent of the LAD as well as some moderate  disease of the ostial D1.  She has some mild diffuse luminal  irregularities of the proximal RCA.  Otherwise, no significant lesions  noted.  No interventions were performed.  Reassurance was provided to  Jennifer [name].  She will be treated medically and may have  microvascular disease that may be contributing to her symptoms.  She may  have some symptoms from the ostial D1 lesion, though the  proximity of  this lesion to the stent, the fact that the diagonal vessel is not even  a moderate-sized vessel would preclude intervention on this vessel.  We  will try medical management first.      Antonieta Iba, MD  Electronically Signed     TJG/MEDQ  D:  01/11/2009  T:  01/12/2009  Job:  [id number]   cc:   Cay Schillings

## 2011-03-15 NOTE — Cardiovascular Report (Signed)
[name], [name]              ACCOUNT NO.:  [id number]   MEDICAL RECORD NO.:  [id number]          PATIENT TYPE:  INP   LOCATION:  2807                         FACILITY:  [facility]   PHYSICIAN:  Cristy Hilts. Jacinto Halim, MD       DATE OF BIRTH:  1956-07-15   DATE OF PROCEDURE:  DATE OF DISCHARGE:                              CARDIAC CATHETERIZATION   PROCEDURE PERFORMED:  PTCA and stent in the mid LAD.   INDICATION:  [name] is a 55 year old female with history of  hypertension, diabetes, hyperlipidemia, and smoking who has been complaining  of recurrent chest discomfort.  After request we had proceeded directly with  a cardiac catheterization, as she has previously has had a stress Myoview,  which was negative.  During this, she underwent diagnostic cardiac  catheterization at [facility] on August 20, 2006, and this had  revealed a 60% obtuse marginal one disease and 80% to 90% stenosis in the  mid LAD.  She was evaluated and ruled for von Willebrand's disease by  hematology consultation at [facility] and then she was brought electively  for angioplasty to her LAD.   ANGIOGRAPHIC DATA:  Left main:  Left main is a large caliber vessel.  This  is smooth and normal.   Circumflex:  Circumflex is a large caliber vessel.  It is smooth.  Distal  circumflex has a mild 20% stenosis.  It gives origin to large obtuse  marginal one which has a proximal 60% smooth stenosis.   LAD:  LAD is a large caliber vessel in the proximal segment. In the  midsegment it gives origin to a moderate size diagonal three and at this  bifurcation there is a long 80% to 85% stenosis.  This diagonal at its  bifurcation has a 20% stenosis.   INTERVENTION DATA:  Successful PTCA and stenting of the LAD with a 2.5 x 28-  mm Cypher deployed at 16 atmospheres of pressure.  This stent was post-  dilated with a 2.75 x 20-mm DuraStar noncompliant balloon at 20 atmospheres  of pressure.  Overall, this  stenosis reduced from 80% to 85% to 0% with TIMI-  2 to TIMI-3 flow maintained at the end of the procedure.  There was no side  branch compromise.   A total of 145 mL of contrast was utilized for diagnostic and interventional  procedure.   RECOMMENDATIONS:  Patient will be continued on aggressive risk factor  modification.  She will be continued on Plavix for the long-term.  Smoking  cessation has been stressed again to the patient.  She will follow up with  me in [place] office in two to three weeks and with Dr. Leim Fabry  in two to three weeks.  She has a hematology consultation tomorrow at 3 p.m.  She will follow with them for evaluation of her von Willebrand's factor  deficiency.   TECHNIQUE OF THE PROCEDURE:  Under usual sterile precaution using a 7-French  right femoral artery access, a 7-French FL 3.5 guide was utilized to engage  left middle coronary artery.  Using Angiomax for anticoagulation  a 190-cm x  0.014-inch ATW guidewire was utilized to cross in to the LAD and the lesion  length was carefully measured.  Then using a 2.5 x 20-mm Five Star balloon  two angioplasties was performed at 10 atmospheres pressure each of 45  seconds.  There were no significant compromise at the diagonal branch at the  bifurcation.  Then I proceeded with stenting with 2.5 x 28-mm Cypher which  was deployed at 16 atmospheres pressure at the mid LAD.  This stent was post  dilated with a DuraStar 2.7 x 20-mm balloon at 18,18, and 20 atmospheres  pressure for 60, 38,  52 seconds respectively.  After having dilated this stent throughout, the  balloon was withdrawn angiography was performed.  During the procedure  intracoronary nitroglycerin was also administered.  Excellent results were  noted.  Patient tolerated procedure well.  No immediate complications noted.      Cristy Hilts. Jacinto Halim, MD  Electronically Signed     JRG/MEDQ  D:  09/01/2006  T:  09/02/2006  Job:  [id number]   cc:   Leim Fabry  Dr. Rolan Lipa

## 2011-03-15 NOTE — Discharge Summary (Signed)
Jennifer [name], Jennifer [name]              ACCOUNT NO.:  [id number]   MEDICAL RECORD NO.:  [id number]          PATIENT TYPE:  INP   LOCATION:  6531                         FACILITY:  [facility]   PHYSICIAN:  Cristy Hilts. Jacinto Halim, MD       DATE OF BIRTH:  [birth year]   DATE OF ADMISSION:  09/01/2006  DATE OF DISCHARGE:  09/02/2006                                 DISCHARGE SUMMARY   DISCHARGE DIAGNOSIS:  1. Coronary artery disease status post dilated stenting during this      admission.  2. Strong family history of coronary disease.  3. Diabetes mellitus type II.  4. Hypertension.  5. Hyperlipidemia.   HISTORY OF PRESENT ILLNESS:  This is 55 year old Caucasian female with  previous history of hyperlipidemia, hypertension, and diabetes, and familial  hypertriglyceridemia who presented to the office for cardiac evaluation.  Patient was a smoker and has other risk factors such as premature family  history of coronary disease, family history of abdominal aortic aneurysm,  hypertension, hyperlipidemia, and Dr. Jacinto Halim decided to proceed with direct  cardiac catheterization, because the last year Myoview stress test was  unrevealing.   Patient was admitted for the coronary angiography and possible intervention  through a short stay union.   HOSPITAL PROCEDURES:  Coronary angiography and stenting of the LAD with  Cypher stent was done by Dr. Jacinto Halim on September 01, 2006.  Cath also revealed  60% with the obtuse marginal 1 stenosis, 20% stenosis mid circumflex, and  20% stenosis of the osteal diagonal.  The patient had a high grade lesion,  80% high grade stenosis lesion of 80% in the mid LAD and the procedure was  carried out successfully using Cypher stent and reducing that stenosis from  80 to 0%.   Patient tolerated the procedure well.  In the morning, was assessed by Dr.  Jacinto Halim and considered to be stable for discharge home.  Her renal function  was normal, BUN 19, creatinine 1.3, as well as cardiac enzymes  within normal  limits next morning after procedure.   Prior to this catheterization, patient was seen by a hematologist, diagnosed  with von Willebrand factor deficiency and is supposed to have, and is  scheduled for an appointment later this afternoon for further evaluation.   DISCHARGE MEDICATIONS:  We are discharging her home on the following  medications:  1. Omacor gram b.i.d.  2. Evista 60 mg q. day.  3. Protonix 40 mg q. day.  4. Tricor 145 mg q. day.  5. Micardis 80 mg q. day.  6. Wellbutrin 300 mg q. day.  7. Plavix 75 mg q. day.  8. Aspirin 325 mg q. day.  9. Coreg 6.25 mg b.i.d.  10.Vytorin 10/20 mg q. day.  11.Norvasc 10 mg q. day.  12.Cymbalta 120 mg q. day.   DISCHARGE INSTRUCTIONS:  Patient was advised to stay on low carb, low  cholesterol, low fat diet.  She was not allowed to drive for 3 days and lift  greater than 5 pounds for 3 days.   Patient was instructed to stop HCTZ.   FOLLOWUP:  Dr. Jacinto Halim will see patient on September 22, 2006 at 4:00 p.m. in  our office.  Dr. Dayna Barker will see patient in 2 weeks.      Raymon Mutton, P.A.      Cristy Hilts. Jacinto Halim, MD  Electronically Signed    MK/MEDQ  D:  09/02/2006  T:  09/02/2006  Job:  [id number]   cc:   Dayna Barker, MD  Cristy Hilts Jacinto Halim, MD  [facility]

## 2011-05-03 ENCOUNTER — Ambulatory Visit: Payer: Self-pay

## 2011-07-24 ENCOUNTER — Telehealth: Payer: Self-pay | Admitting: *Deleted

## 2011-07-24 NOTE — Telephone Encounter (Signed)
Would ask if stress level is higher. Would continue to monitor BP several times a day. Hard to adjust meds based on select blood pressures. For now, would increase amlodipine to 10 mg Continue imdur 30 mg (take take BID if needed) Drop bps by the office

## 2011-07-24 NOTE — Telephone Encounter (Signed)
Pt calling c/o very high BP 177/120 (120 highest she has seen diastolic), ave BP past couple weeks has been diastolic 100-110. HR has been 100-105. Pt is afraid that "every time comes in for ov, she is sent to hosp." She does c/o angina and left arm "sensation" occasional, at rest, relieved by 'taking asa and continuing to rest' usually lasts couple of minutes sometimes an hour. She has some SOB but "this is normal," she quit smoking >1 yr ago after stent. Pt takes Amlodipine 5mg  (took 10mg  x 2 days), Imdur normally takes 30mg  1/2 tab, x 2 days has taken 30mg , she also takes Coreg 12.5bid, Micardis 80-12.5mg  qd, and with increases x 2 days she has seen no change in BP.  Pt seen 01/2011 per note, etiology of her chest pain was likely secondary to underlying anxiety and stress. She does have underlying coronary artery disease though she did have a cardiac catheterization performed August of last year. She was not interested in a stress test at that time.  Notified pt would send msg to Dr. Mariah Milling, but if she develops worsening symptoms or is unstable to call 911 or have someone take her to ER. Otherwise, will call her w/ rec since she did not want what was advised at last visit. Will call back regarding getting BP controlled.

## 2011-07-25 NOTE — Telephone Encounter (Signed)
Spoke to pt, notified of rec's below. She does state her stress is still elevated. She will incr imdur to 30mg  daily and BID if needed, and amlodipine to 10mg  qd. Pt will monitor BP TID and drop off numbers in 1 week, and will call with any changes in the meantime. Advised pt to call 911 or go to ER if CP/SOB occur at night or weekend and does not subside or feels unstable. Pt understands this, and I advised her to schedule f/u if she feels BP or symptoms are not controlled.

## 2011-07-31 ENCOUNTER — Telehealth: Payer: Self-pay | Admitting: *Deleted

## 2011-07-31 NOTE — Telephone Encounter (Signed)
Pt states she has continued to have CP symptoms, some SOB, similar to previous msg. She has continued to take Imdur 30mg  BID as instructed. Refer to previous phone msg, she has multiple factors that have been thought to cause atypical CP symptoms. Pt hesitant to go to ER. I advised pt if CP/SOB continues or unstable to call 911 or have someone drive her to ER. Otherwise, after discussing with Dr. Mariah Milling, pt can try Ranexa in addition to what she is taking now. She states she would like to do this, will leave samples at front and I scheduled f/u for next week (1st available). She will take Ranexa 500mg  BID x 1 week then incr to 1000mg  bid if CP continues. Pt will call sooner with any changes.

## 2011-08-05 ENCOUNTER — Inpatient Hospital Stay: Payer: Self-pay | Admitting: Cardiovascular Disease

## 2011-08-05 ENCOUNTER — Telehealth: Payer: Self-pay | Admitting: *Deleted

## 2011-08-05 NOTE — Telephone Encounter (Signed)
Pt called stating she has continue CP. Last week pt had same c/o and she had no relief after Imdur, so per TG, pt given Ranexa in addition. She states after Ranexa and Imdur and taking NTG SL today she continues to have CP and her BP is 172/108 and HR is 103. We do not have MD in office this afternoon; I advised pt to go to ER or call 911. Pt states she will do this and will send records with her recent workup to [facility].

## 2011-08-06 ENCOUNTER — Inpatient Hospital Stay (HOSPITAL_COMMUNITY)
Admission: EM | Admit: 2011-08-06 | Discharge: 2011-08-17 | DRG: 236 | Disposition: A | Discharge status: Home / Self Care | Source: Other Acute Inpatient Hospital | Attending: Thoracic Surgery (Cardiothoracic Vascular Surgery) | Admitting: Thoracic Surgery (Cardiothoracic Vascular Surgery)

## 2011-08-06 ENCOUNTER — Encounter: Payer: Self-pay | Admitting: Cardiovascular Disease

## 2011-08-06 ENCOUNTER — Inpatient Hospital Stay (HOSPITAL_COMMUNITY)

## 2011-08-06 DIAGNOSIS — I10 Essential (primary) hypertension: Secondary | ICD-10-CM | POA: Diagnosis present

## 2011-08-06 DIAGNOSIS — J449 Chronic obstructive pulmonary disease, unspecified: Secondary | ICD-10-CM | POA: Diagnosis present

## 2011-08-06 DIAGNOSIS — E8779 Other fluid overload: Secondary | ICD-10-CM | POA: Diagnosis not present

## 2011-08-06 DIAGNOSIS — E785 Hyperlipidemia, unspecified: Secondary | ICD-10-CM | POA: Diagnosis present

## 2011-08-06 DIAGNOSIS — G8929 Other chronic pain: Secondary | ICD-10-CM | POA: Diagnosis present

## 2011-08-06 DIAGNOSIS — I2510 Atherosclerotic heart disease of native coronary artery without angina pectoris: Principal | ICD-10-CM | POA: Diagnosis present

## 2011-08-06 DIAGNOSIS — K5900 Constipation, unspecified: Secondary | ICD-10-CM | POA: Diagnosis not present

## 2011-08-06 DIAGNOSIS — I959 Hypotension, unspecified: Secondary | ICD-10-CM | POA: Diagnosis not present

## 2011-08-06 DIAGNOSIS — E119 Type 2 diabetes mellitus without complications: Secondary | ICD-10-CM | POA: Diagnosis present

## 2011-08-06 DIAGNOSIS — F341 Dysthymic disorder: Secondary | ICD-10-CM | POA: Diagnosis present

## 2011-08-06 DIAGNOSIS — J90 Pleural effusion, not elsewhere classified: Secondary | ICD-10-CM | POA: Diagnosis not present

## 2011-08-06 DIAGNOSIS — D6800 Von Willebrand disease, unspecified: Secondary | ICD-10-CM | POA: Diagnosis present

## 2011-08-06 DIAGNOSIS — T82897A Other specified complication of cardiac prosthetic devices, implants and grafts, initial encounter: Secondary | ICD-10-CM | POA: Diagnosis present

## 2011-08-06 DIAGNOSIS — M545 Low back pain, unspecified: Secondary | ICD-10-CM | POA: Diagnosis present

## 2011-08-06 DIAGNOSIS — I200 Unstable angina: Secondary | ICD-10-CM | POA: Diagnosis present

## 2011-08-06 DIAGNOSIS — Z87891 Personal history of nicotine dependence: Secondary | ICD-10-CM

## 2011-08-06 DIAGNOSIS — Z01810 Encounter for preprocedural cardiovascular examination: Secondary | ICD-10-CM

## 2011-08-06 DIAGNOSIS — J4489 Other specified chronic obstructive pulmonary disease: Secondary | ICD-10-CM | POA: Diagnosis present

## 2011-08-06 DIAGNOSIS — Z79899 Other long term (current) drug therapy: Secondary | ICD-10-CM

## 2011-08-06 DIAGNOSIS — Z7982 Long term (current) use of aspirin: Secondary | ICD-10-CM

## 2011-08-06 DIAGNOSIS — Y849 Medical procedure, unspecified as the cause of abnormal reaction of the patient, or of later complication, without mention of misadventure at the time of the procedure: Secondary | ICD-10-CM | POA: Diagnosis present

## 2011-08-06 DIAGNOSIS — Z794 Long term (current) use of insulin: Secondary | ICD-10-CM

## 2011-08-06 DIAGNOSIS — D62 Acute posthemorrhagic anemia: Secondary | ICD-10-CM | POA: Diagnosis not present

## 2011-08-06 DIAGNOSIS — J9819 Other pulmonary collapse: Secondary | ICD-10-CM | POA: Diagnosis not present

## 2011-08-06 DIAGNOSIS — Z7902 Long term (current) use of antithrombotics/antiplatelets: Secondary | ICD-10-CM

## 2011-08-06 DIAGNOSIS — R072 Precordial pain: Secondary | ICD-10-CM

## 2011-08-06 HISTORY — PX: LEFT HEART CATH AND CORONARY ANGIOGRAPHY: CATH118249

## 2011-08-06 LAB — PULMONARY FUNCTION TEST

## 2011-08-06 LAB — PLATELET INHIBITION P2Y12: Platelet Function  P2Y12: 109 [PRU] — ABNORMAL LOW (ref 194–418)

## 2011-08-06 LAB — MRSA PCR SCREENING: MRSA by PCR: NEGATIVE

## 2011-08-06 LAB — GLUCOSE, CAPILLARY
Glucose-Capillary: 177 mg/dL — ABNORMAL HIGH (ref 70–99)
Glucose-Capillary: 161 mg/dL — ABNORMAL HIGH (ref 70–99)

## 2011-08-07 LAB — COMPREHENSIVE METABOLIC PANEL
AST: 38 U/L — ABNORMAL HIGH (ref 0–37)
Alkaline Phosphatase: 77 U/L (ref 39–117)
CO2: 22 mEq/L (ref 19–32)
Chloride: 105 mEq/L (ref 96–112)
Creatinine, Ser: 1.09 mg/dL (ref 0.50–1.10)
GFR calc non Af Amer: 56 mL/min — ABNORMAL LOW (ref >90)
Potassium: 3.9 mEq/L (ref 3.5–5.1)
Total Bilirubin: 0.3 mg/dL (ref 0.3–1.2)

## 2011-08-07 LAB — CBC
HCT: 33.3 % — ABNORMAL LOW (ref 36.0–46.0)
Hemoglobin: 10.9 g/dL — ABNORMAL LOW (ref 12.0–15.0)
MCHC: 32.7 g/dL (ref 30.0–36.0)
Platelets: 265 10*3/uL (ref 150–400)
RBC: 3.74 MIL/uL — ABNORMAL LOW (ref 3.87–5.11)
WBC: 4.9 10*3/uL (ref 4.0–10.5)

## 2011-08-07 LAB — HEPARIN LEVEL (UNFRACTIONATED)
Heparin Unfractionated: <0.1 IU/mL — ABNORMAL LOW (ref 0.30–0.70)
Heparin Unfractionated: 0.16 IU/mL — ABNORMAL LOW (ref 0.30–0.70)

## 2011-08-07 LAB — PROTIME-INR: INR: 1.02 (ref 0.00–1.49)

## 2011-08-08 ENCOUNTER — Encounter: Payer: Self-pay | Admitting: Cardiovascular Disease

## 2011-08-08 DIAGNOSIS — I2510 Atherosclerotic heart disease of native coronary artery without angina pectoris: Secondary | ICD-10-CM

## 2011-08-08 DIAGNOSIS — I214 Non-ST elevation (NSTEMI) myocardial infarction: Secondary | ICD-10-CM

## 2011-08-08 LAB — GLUCOSE, CAPILLARY
Glucose-Capillary: 156 mg/dL — ABNORMAL HIGH (ref 70–99)
Glucose-Capillary: 131 mg/dL — ABNORMAL HIGH (ref 70–99)
Glucose-Capillary: 145 mg/dL — ABNORMAL HIGH (ref 70–99)
Glucose-Capillary: 139 mg/dL — ABNORMAL HIGH (ref 70–99)

## 2011-08-08 LAB — HEPARIN LEVEL (UNFRACTIONATED)
Heparin Unfractionated: 0.44 IU/mL (ref 0.30–0.70)
Heparin Unfractionated: 0.34 IU/mL (ref 0.30–0.70)

## 2011-08-08 LAB — BASIC METABOLIC PANEL
BUN: 20 mg/dL (ref 6–23)
Chloride: 103 mEq/L (ref 96–112)
GFR calc Af Amer: 63 mL/min — ABNORMAL LOW (ref >90)
GFR calc non Af Amer: 54 mL/min — ABNORMAL LOW (ref >90)
Potassium: 4.6 mEq/L (ref 3.5–5.1)
Sodium: 137 mEq/L (ref 135–145)

## 2011-08-09 ENCOUNTER — Ambulatory Visit: Admitting: Cardiovascular Disease

## 2011-08-09 ENCOUNTER — Encounter: Payer: Self-pay | Admitting: *Deleted

## 2011-08-09 LAB — CBC
MCH: 29.3 pg (ref 26.0–34.0)
MCHC: 32.3 g/dL (ref 30.0–36.0)
MCV: 90.6 fL (ref 78.0–100.0)
Platelets: 239 10*3/uL (ref 150–400)

## 2011-08-09 LAB — BASIC METABOLIC PANEL
BUN: 18 mg/dL (ref 6–23)
Calcium: 9.5 mg/dL (ref 8.4–10.5)
Creatinine, Ser: 1.04 mg/dL (ref 0.50–1.10)
GFR calc Af Amer: 68 mL/min — ABNORMAL LOW (ref >90)

## 2011-08-09 LAB — GLUCOSE, CAPILLARY
Glucose-Capillary: 119 mg/dL — ABNORMAL HIGH (ref 70–99)
Glucose-Capillary: 126 mg/dL — ABNORMAL HIGH (ref 70–99)

## 2011-08-09 LAB — HEPARIN LEVEL (UNFRACTIONATED): Heparin Unfractionated: 0.37 IU/mL (ref 0.30–0.70)

## 2011-08-10 DIAGNOSIS — I2510 Atherosclerotic heart disease of native coronary artery without angina pectoris: Secondary | ICD-10-CM

## 2011-08-10 LAB — BASIC METABOLIC PANEL
BUN: 18 mg/dL (ref 6–23)
Chloride: 105 mEq/L (ref 96–112)
Creatinine, Ser: 1.15 mg/dL — ABNORMAL HIGH (ref 0.50–1.10)
GFR calc Af Amer: 60 mL/min — ABNORMAL LOW (ref >90)

## 2011-08-10 LAB — BLOOD GAS, ARTERIAL
Acid-Base Excess: 0.4 mmol/L (ref 0.0–2.0)
FIO2: 0.21 %
TCO2: 27.1 mmol/L (ref 0–100)
pCO2 arterial: 49.2 mmHg — ABNORMAL HIGH (ref 35.0–45.0)
pH, Arterial: 7.335 — ABNORMAL LOW (ref 7.350–7.400)
pO2, Arterial: 65.6 mmHg — ABNORMAL LOW (ref 80.0–100.0)

## 2011-08-10 LAB — GLUCOSE, CAPILLARY: Glucose-Capillary: 168 mg/dL — ABNORMAL HIGH (ref 70–99)

## 2011-08-10 LAB — CBC
MCV: 90.8 fL (ref 78.0–100.0)
Platelets: 252 10*3/uL (ref 150–400)
RBC: 3.47 MIL/uL — ABNORMAL LOW (ref 3.87–5.11)
RDW: 15 % (ref 11.5–15.5)
WBC: 5.3 10*3/uL (ref 4.0–10.5)

## 2011-08-10 LAB — HEPARIN LEVEL (UNFRACTIONATED)
Heparin Unfractionated: <0.1 IU/mL — ABNORMAL LOW (ref 0.30–0.70)
Heparin Unfractionated: 0.81 IU/mL — ABNORMAL HIGH (ref 0.30–0.70)

## 2011-08-11 ENCOUNTER — Inpatient Hospital Stay (HOSPITAL_COMMUNITY)

## 2011-08-11 LAB — CBC
HCT: 32.9 % — ABNORMAL LOW (ref 36.0–46.0)
Hemoglobin: 10.5 g/dL — ABNORMAL LOW (ref 12.0–15.0)
WBC: 5.8 10*3/uL (ref 4.0–10.5)

## 2011-08-11 LAB — HEPARIN LEVEL (UNFRACTIONATED): Heparin Unfractionated: 0.53 IU/mL (ref 0.30–0.70)

## 2011-08-11 LAB — DIFFERENTIAL
Basophils Absolute: 0 10*3/uL (ref 0.0–0.1)
Lymphocytes Relative: 35 % (ref 12–46)
Neutro Abs: 3.2 10*3/uL (ref 1.7–7.7)

## 2011-08-11 LAB — SURGICAL PCR SCREEN
MRSA, PCR: NEGATIVE
Staphylococcus aureus: NEGATIVE

## 2011-08-11 LAB — BASIC METABOLIC PANEL
BUN: 19 mg/dL (ref 6–23)
GFR calc Af Amer: 62 mL/min — ABNORMAL LOW (ref >90)
GFR calc non Af Amer: 53 mL/min — ABNORMAL LOW (ref >90)
Potassium: 3.9 mEq/L (ref 3.5–5.1)
Sodium: 140 mEq/L (ref 135–145)

## 2011-08-11 LAB — HEMOGLOBIN A1C
Hgb A1c MFr Bld: 8 % — ABNORMAL HIGH
Mean Plasma Glucose: 183 mg/dL — ABNORMAL HIGH

## 2011-08-11 LAB — APTT: aPTT: 131 s — ABNORMAL HIGH (ref 24–37)

## 2011-08-11 LAB — GLUCOSE, CAPILLARY: Glucose-Capillary: 120 mg/dL — ABNORMAL HIGH (ref 70–99)

## 2011-08-12 ENCOUNTER — Inpatient Hospital Stay (HOSPITAL_COMMUNITY)

## 2011-08-12 DIAGNOSIS — Z951 Presence of aortocoronary bypass graft: Secondary | ICD-10-CM

## 2011-08-12 DIAGNOSIS — I2510 Atherosclerotic heart disease of native coronary artery without angina pectoris: Secondary | ICD-10-CM

## 2011-08-12 DIAGNOSIS — R072 Precordial pain: Secondary | ICD-10-CM

## 2011-08-12 HISTORY — DX: Presence of aortocoronary bypass graft: Z95.1

## 2011-08-12 HISTORY — PX: CORONARY ARTERY BYPASS GRAFT: SHX141

## 2011-08-12 LAB — POCT I-STAT, CHEM 8
BUN: 10 mg/dL (ref 6–23)
BUN: 13 mg/dL (ref 6–23)
Chloride: 109 mEq/L (ref 96–112)
Chloride: 112 mEq/L (ref 96–112)
Creatinine, Ser: 0.8 mg/dL (ref 0.50–1.10)
Creatinine, Ser: 1 mg/dL (ref 0.50–1.10)
Hemoglobin: 7.1 g/dL — ABNORMAL LOW (ref 12.0–15.0)
Potassium: 3.3 mEq/L — ABNORMAL LOW (ref 3.5–5.1)
Potassium: 4 mEq/L (ref 3.5–5.1)
Sodium: 138 mEq/L (ref 135–145)
Sodium: 144 mEq/L (ref 135–145)

## 2011-08-12 LAB — CBC
HCT: 34.3 % — ABNORMAL LOW (ref 36.0–46.0)
HCT: 26.8 % — ABNORMAL LOW (ref 36.0–46.0)
Hemoglobin: 7.5 g/dL — ABNORMAL LOW (ref 12.0–15.0)
Hemoglobin: 8.7 g/dL — ABNORMAL LOW (ref 12.0–15.0)
MCH: 29.1 pg (ref 26.0–34.0)
MCH: 29 pg (ref 26.0–34.0)
MCH: 29.4 pg (ref 26.0–34.0)
MCHC: 31.8 g/dL (ref 30.0–36.0)
MCHC: 31.9 g/dL (ref 30.0–36.0)
MCV: 90.7 fL (ref 78.0–100.0)
MCV: 90.5 fL (ref 78.0–100.0)
Platelets: 145 10*3/uL — ABNORMAL LOW (ref 150–400)
RBC: 2.59 MIL/uL — ABNORMAL LOW (ref 3.87–5.11)
RBC: 2.96 MIL/uL — ABNORMAL LOW (ref 3.87–5.11)
RDW: 15.1 % (ref 11.5–15.5)

## 2011-08-12 LAB — POCT I-STAT 3, ART BLOOD GAS (G3+)
Acid-base deficit: 3 mmol/L — ABNORMAL HIGH (ref 0.0–2.0)
Bicarbonate: 25.8 mEq/L — ABNORMAL HIGH (ref 20.0–24.0)
Bicarbonate: 22.7 mEq/L (ref 20.0–24.0)
Bicarbonate: 23.3 mEq/L (ref 20.0–24.0)
Bicarbonate: 22.4 mEq/L (ref 20.0–24.0)
O2 Saturation: 90 %
TCO2: 27 mmol/L (ref 0–100)
TCO2: 25 mmol/L (ref 0–100)
TCO2: 24 mmol/L (ref 0–100)
pCO2 arterial: 40.5 mmHg (ref 35.0–45.0)
pCO2 arterial: 41.1 mmHg (ref 35.0–45.0)
pCO2 arterial: 42.4 mmHg (ref 35.0–45.0)
pCO2 arterial: 39.5 mmHg (ref 35.0–45.0)
pH, Arterial: 7.412 — ABNORMAL HIGH (ref 7.350–7.400)
pH, Arterial: 7.345 — ABNORMAL LOW (ref 7.350–7.400)
pH, Arterial: 7.345 — ABNORMAL LOW (ref 7.350–7.400)
pH, Arterial: 7.36 (ref 7.350–7.400)
pO2, Arterial: 328 mmHg — ABNORMAL HIGH (ref 80.0–100.0)
pO2, Arterial: 58 mmHg — ABNORMAL LOW (ref 80.0–100.0)

## 2011-08-12 LAB — POCT I-STAT 4, (NA,K, GLUC, HGB,HCT)
Glucose, Bld: 133 mg/dL — ABNORMAL HIGH (ref 70–99)
Glucose, Bld: 115 mg/dL — ABNORMAL HIGH (ref 70–99)
Glucose, Bld: 124 mg/dL — ABNORMAL HIGH (ref 70–99)
Glucose, Bld: 153 mg/dL — ABNORMAL HIGH (ref 70–99)
HCT: 31 % — ABNORMAL LOW (ref 36.0–46.0)
HCT: 21 % — ABNORMAL LOW (ref 36.0–46.0)
HCT: 23 % — ABNORMAL LOW (ref 36.0–46.0)
Hemoglobin: 7.1 g/dL — ABNORMAL LOW (ref 12.0–15.0)
Hemoglobin: 9.9 g/dL — ABNORMAL LOW (ref 12.0–15.0)
Hemoglobin: 7.8 g/dL — ABNORMAL LOW (ref 12.0–15.0)
Potassium: 4.1 mEq/L (ref 3.5–5.1)
Potassium: 4 mEq/L (ref 3.5–5.1)
Potassium: 4.8 mEq/L (ref 3.5–5.1)
Sodium: 135 mEq/L (ref 135–145)
Sodium: 138 mEq/L (ref 135–145)
Sodium: 137 mEq/L (ref 135–145)

## 2011-08-12 LAB — PROTIME-INR: INR: 0.98 (ref 0.00–1.49)

## 2011-08-12 LAB — CREATININE, SERUM: Creatinine, Ser: 0.83 mg/dL (ref 0.50–1.10)

## 2011-08-12 LAB — BASIC METABOLIC PANEL
BUN: 17 mg/dL (ref 6–23)
Chloride: 104 mEq/L (ref 96–112)
Creatinine, Ser: 1.13 mg/dL — ABNORMAL HIGH (ref 0.50–1.10)
GFR calc Af Amer: 62 mL/min — ABNORMAL LOW (ref >90)
Glucose, Bld: 128 mg/dL — ABNORMAL HIGH (ref 70–99)
Potassium: 4.4 mEq/L (ref 3.5–5.1)

## 2011-08-12 LAB — PLATELET COUNT: Platelets: 171 10*3/uL (ref 150–400)

## 2011-08-12 LAB — HEMOGLOBIN AND HEMATOCRIT, BLOOD: HCT: 22.3 % — ABNORMAL LOW (ref 36.0–46.0)

## 2011-08-12 LAB — GLUCOSE, CAPILLARY

## 2011-08-12 LAB — MAGNESIUM: Magnesium: 2.6 mg/dL — ABNORMAL HIGH (ref 1.5–2.5)

## 2011-08-13 ENCOUNTER — Inpatient Hospital Stay (HOSPITAL_COMMUNITY)

## 2011-08-13 LAB — BASIC METABOLIC PANEL
BUN: 13 mg/dL (ref 6–23)
BUN: 20 mg/dL (ref 6–23)
Calcium: 8.7 mg/dL (ref 8.4–10.5)
Chloride: 108 mEq/L (ref 96–112)
Creatinine, Ser: 0.87 mg/dL (ref 0.50–1.10)
Creatinine, Ser: 1.05 mg/dL (ref 0.50–1.10)
GFR calc Af Amer: 85 mL/min — ABNORMAL LOW (ref >90)
GFR calc Af Amer: 67 mL/min — ABNORMAL LOW (ref >90)
GFR calc non Af Amer: 73 mL/min — ABNORMAL LOW (ref >90)
GFR calc non Af Amer: 58 mL/min — ABNORMAL LOW (ref >90)
Glucose, Bld: 194 mg/dL — ABNORMAL HIGH (ref 70–99)
Potassium: 4.2 mEq/L (ref 3.5–5.1)

## 2011-08-13 LAB — GLUCOSE, CAPILLARY
Glucose-Capillary: 131 mg/dL — ABNORMAL HIGH (ref 70–99)
Glucose-Capillary: 111 mg/dL — ABNORMAL HIGH (ref 70–99)
Glucose-Capillary: 106 mg/dL — ABNORMAL HIGH (ref 70–99)
Glucose-Capillary: 126 mg/dL — ABNORMAL HIGH (ref 70–99)
Glucose-Capillary: 117 mg/dL — ABNORMAL HIGH (ref 70–99)
Glucose-Capillary: 236 mg/dL — ABNORMAL HIGH (ref 70–99)

## 2011-08-13 LAB — CBC
HCT: 26.3 % — ABNORMAL LOW (ref 36.0–46.0)
HCT: 26.5 % — ABNORMAL LOW (ref 36.0–46.0)
MCHC: 31.9 g/dL (ref 30.0–36.0)
MCHC: 31.7 g/dL (ref 30.0–36.0)
MCV: 91.1 fL (ref 78.0–100.0)
Platelets: 255 10*3/uL (ref 150–400)
Platelets: 220 10*3/uL (ref 150–400)
RDW: 15.5 % (ref 11.5–15.5)
RDW: 15.7 % — ABNORMAL HIGH (ref 11.5–15.5)
WBC: 10.9 10*3/uL — ABNORMAL HIGH (ref 4.0–10.5)

## 2011-08-13 LAB — POCT I-STAT 3, ART BLOOD GAS (G3+)
Bicarbonate: 22.5 mEq/L (ref 20.0–24.0)
Patient temperature: 36.9
pH, Arterial: 7.364 (ref 7.350–7.400)

## 2011-08-13 LAB — MAGNESIUM: Magnesium: 2.2 mg/dL (ref 1.5–2.5)

## 2011-08-14 ENCOUNTER — Inpatient Hospital Stay (HOSPITAL_COMMUNITY)

## 2011-08-14 LAB — CBC
HCT: 24.1 % — ABNORMAL LOW (ref 36.0–46.0)
Hemoglobin: 7.8 g/dL — ABNORMAL LOW (ref 12.0–15.0)
MCH: 29.4 pg (ref 26.0–34.0)
MCHC: 32.4 g/dL (ref 30.0–36.0)
MCV: 90.9 fL (ref 78.0–100.0)
Platelets: 188 K/uL (ref 150–400)
RBC: 2.65 MIL/uL — ABNORMAL LOW (ref 3.87–5.11)
RDW: 15.5 % (ref 11.5–15.5)
WBC: 6.8 K/uL (ref 4.0–10.5)

## 2011-08-14 LAB — GLUCOSE, CAPILLARY
Glucose-Capillary: 115 mg/dL — ABNORMAL HIGH (ref 70–99)
Glucose-Capillary: 142 mg/dL — ABNORMAL HIGH (ref 70–99)
Glucose-Capillary: 168 mg/dL — ABNORMAL HIGH (ref 70–99)
Glucose-Capillary: 165 mg/dL — ABNORMAL HIGH (ref 70–99)

## 2011-08-14 LAB — BASIC METABOLIC PANEL
BUN: 20 mg/dL (ref 6–23)
CO2: 26 mEq/L (ref 19–32)
Chloride: 103 mEq/L (ref 96–112)
GFR calc Af Amer: 75 mL/min — ABNORMAL LOW (ref >90)
Potassium: 4.1 mEq/L (ref 3.5–5.1)

## 2011-08-15 ENCOUNTER — Inpatient Hospital Stay (HOSPITAL_COMMUNITY)

## 2011-08-15 LAB — TYPE AND SCREEN
ABO/RH(D): O POS
Antibody Screen: NEGATIVE
Unit division: 0
Unit division: 0

## 2011-08-15 LAB — BASIC METABOLIC PANEL
CO2: 28 mEq/L (ref 19–32)
Calcium: 8.9 mg/dL (ref 8.4–10.5)
Glucose, Bld: 102 mg/dL — ABNORMAL HIGH (ref 70–99)
Potassium: 3.5 mEq/L (ref 3.5–5.1)
Sodium: 135 mEq/L (ref 135–145)

## 2011-08-15 LAB — GLUCOSE, CAPILLARY
Glucose-Capillary: 97 mg/dL (ref 70–99)
Glucose-Capillary: 103 mg/dL — ABNORMAL HIGH (ref 70–99)
Glucose-Capillary: 104 mg/dL — ABNORMAL HIGH (ref 70–99)
Glucose-Capillary: 104 mg/dL — ABNORMAL HIGH (ref 70–99)
Glucose-Capillary: 113 mg/dL — ABNORMAL HIGH (ref 70–99)
Glucose-Capillary: 120 mg/dL — ABNORMAL HIGH (ref 70–99)
Glucose-Capillary: 128 mg/dL — ABNORMAL HIGH (ref 70–99)
Glucose-Capillary: 139 mg/dL — ABNORMAL HIGH (ref 70–99)
Glucose-Capillary: 94 mg/dL (ref 70–99)
Glucose-Capillary: 188 mg/dL — ABNORMAL HIGH (ref 70–99)

## 2011-08-15 LAB — CBC
Hemoglobin: 7.9 g/dL — ABNORMAL LOW (ref 12.0–15.0)
MCH: 28.8 pg (ref 26.0–34.0)
Platelets: 248 10*3/uL (ref 150–400)
RBC: 2.74 MIL/uL — ABNORMAL LOW (ref 3.87–5.11)
WBC: 6.7 10*3/uL (ref 4.0–10.5)

## 2011-08-15 NOTE — Op Note (Signed)
NAMELAKEISA, Jennifer NO.:  [name]  MEDICAL RECORD NO.:  [id number]  LOCATION:  2310                         FACILITY:  [facility]  PHYSICIAN:  Guadalupe Maple, M.D.  DATE OF BIRTH:  Jun 24, 1956  DATE OF PROCEDURE:  08/12/2011 DATE OF DISCHARGE:                              OPERATIVE REPORT   SURGEON:  Guadalupe Maple, MD  PROCEDURE:  Intraoperative transesophageal echocardiography.  Ms. [name] is a 55 year old white female with a history of coronary artery disease and previous stent to her left anterior descending coronary artery, who presented with unstable angina.  She was noted to have 3-vessel disease with occlusion of her mid left anterior descending, right coronary artery, and a D1 first diagonal branch.  She is now scheduled to undergo coronary artery bypass grafting by Dr. Dorris Fetch.  Intraoperative transesophageal echocardiography was requested to evaluate the left and right ventricular function and to serve as a monitor for intraoperative volume status and to determine if any valvular pathology was present.  The patient was brought to the Operating Room at [facility]. General anesthesia was induced without difficulty.  The trachea was intubated without difficulty.  Following orogastric suctioning, the transesophageal echocardiography probe was inserted into the esophagus without difficulty.  IMPRESSION:  Prebypass Findings: 1. Aortic valve.  The aortic valve was trileaflet.  The leaflets open     normally, and there was no aortic insufficiency. 2. Mitral valve.  The mitral leaflets opened normally and coapted well     without prolapse or fluttering.  There was mild mitral annular     calcification and there was trace to 1+ mitral insufficiency noted. 3. Left ventricle.  There was good contractility in all segments     interrogated and ejection fraction was estimated at 55-60%.  There     was no thrombus noted in the left  ventricular apex.  No regional     wall motion abnormalities. 4. Right ventricle.  The right ventricular function appeared normal.     There was good contractility of the right ventricular free wall and     normal right ventricular size. 5. Tricuspid valve.  The tricuspid leaflets appeared structurally     intact with trace to 1+ tricuspid insufficiency. 6. Interatrial septum.  The interatrial septum was intact without     evidence of patent foramen ovale or atrial septal defect by color     Doppler or bubble study. 7. Left atrium.  The left atrium and left atrial appendage appeared     free of thrombus. 8. Ascending aorta.  The ascending aorta showed a well-defined     sinotubular ridge with mild thickening of the walls and no     aneurysmal dilatation and no significant atheromatous disease     appreciated. 9. Descending aorta.  The descending aorta measured 1.8 cm in diameter     and appeared free of significant atheromatous disease.  Postbypass findings: 1. Aortic valve.  The aortic valve appeared normal and was unchanged     from the prebypass study. 2. Mitral valve.  Again, there was trace to 1+ mitral insufficiency     and otherwise normal-appearing mitral function. 3.  Left ventricle.  There was a good contractility and all segments     were interrogated.  Ejection fraction was again     estimated at 55-60%. 4. Right ventricle.  The right ventricular function appeared normal     with good contractility of the right ventricular free wall.          ______________________________ Guadalupe Maple, M.D.     DCJ/MEDQ  D:  08/12/2011  T:  08/13/2011  Job:  [id number]  Electronically Signed by Kipp Brood M.D. on 08/15/2011 05:00:58 PM

## 2011-08-16 LAB — GLUCOSE, CAPILLARY
Glucose-Capillary: 81 mg/dL (ref 70–99)
Glucose-Capillary: 141 mg/dL — ABNORMAL HIGH (ref 70–99)

## 2011-08-16 NOTE — Op Note (Signed)
NAMEJAYELLE, PAGE              ACCOUNT NO.:  [id number]  MEDICAL RECORD NO.:  [id number]  LOCATION:                                 FACILITY:  PHYSICIAN:  Salvatore Decent. Dorris Fetch, M.D.DATE OF BIRTH:  [birth year]  DATE OF PROCEDURE: DATE OF DISCHARGE:                              OPERATIVE REPORT   PREOPERATIVE DIAGNOSIS:  Severe two-vessel coronary disease with unstable angina.  POSTOPERATIVE DIAGNOSIS:  Severe two-vessel coronary disease with unstable angina.  PROCEDURE:  Median sternotomy, extracorporeal circulation, coronary artery bypass grafting x3 (left internal mammary artery to LAD, saphenous vein graft to first diagonal, and saphenous vein graft to distal right coronary).  SURGEON:  Salvatore Decent. Dorris Fetch, MD  ASSISTANT:  Rowe Clack, PA-C  ANESTHESIA:  General.  FINDINGS:  Left radial artery too small to utilize as a bypass graft. Mammary artery and vein good quality. Good quality targets.  CLINICAL NOTE:  Jennifer [name] is a 55 year old woman with known coronary disease.  She has previously had PTCA and stenting both in 2007 and 2011.  She presents with unstable angina.  At catheterization, she has had 95% in-stent restenosis in the LAD.  There was a 70% stenosis in the first diagonal and diffuse 90% stenosis in the mid right coronary.  The patient was referred for coronary artery bypass grafting.  She had been on Effient and also has a history of von Willebrand deficiency.  She was advised to wait for Effient to wash out prior to undergoing surgery. The indications, risks, benefits and alternative treatments were discussed in detail with the patient.  She understood and accepted the risks and agreed to proceed.  OPERATIVE NOTE:  Jennifer [name] was brought to the preop holding area on August 12, 2011.  There, the Anesthesia Service placed a Swan-Ganz catheter and arterial blood pressure monitoring line.  Intravenous antibiotics were administered.  She was  taken to the operating room, anesthetized, and intubated.  A Foley catheter was placed.  The chest, abdomen, and legs were prepped and draped in usual sterile fashion.  The left arm likewise was prepped and draped in the usual sterile fashion. An incision was made on the volar aspect of the left wrist, 3-4 cm in length, and was carried through the skin and subcutaneous tissue.  The fascia overlying the radial artery was incised.  The radial artery was inspected.  It was a very small vessel, which did not appear suitable for use as a bypass graft.  This incision was then closed and the arm was subsequently tucked to the patient's side.  A median sternotomy was performed, and the left internal mammary artery was harvested using standard technique.  Simultaneously, an incision was made in the medial aspect of the right leg at the level of the knee. The greater saphenous vein was identified and was harvested from the right thigh endoscopically.  2000 units of heparin was administered during the vessel harvest.  Both the mammary artery and saphenous vein were good quality vessels.  After harvesting the conduits, remainder of the full heparin dose was given.  After confirming adequate anticoagulation with ACT measurement, the pericardium was opened.  The ascending aorta was inspected.  It  was normal size and soft with no evidence of atherosclerotic disease.  The aorta was cannulated via concentric 2-0 Ethibond pledgeted pursestring sutures.  A dual-stage venous cannula was placed via pursestring suture in the right atrial appendage.  Cardiopulmonary bypass was instituted and the patient was cooled to 32 degrees Celsius.  The coronary arteries were inspected and anastomotic sites were chosen.  The conduits were inspected and cut to length.  A foam pad was placed in the pericardium to insulate the heart and protect the left phrenic nerve.  A temperature probe was placed in myocardial septum and  a cardioplegic cannula was placed in the ascending aorta.  The aorta was crossclamped.  The left ventricle was emptied via the aortic root vent.  Cardiac arrest then was achieved with combination of cold antegrade blood cardioplegia and topical iced saline.  1 L of cardioplegia was administered.  The myocardial septal temperature cooled to 11 degrees Celsius.  The following distal anastomoses were performed.  First, a reversed saphenous vein graft was placed end-to-side to the distal right coronary artery.  This was a 2 mm good quality target.  The vein was of good quality was anastomosed end-to-side with a running 7-0 Prolene suture.  At the completion of each anastomosis, it was probed proximally and distally to ensure patency.  Cardioplegia was administered at the completion of each vein graft to assess flow and hemostasis.  Next, a reverse saphenous vein graft was placed end-to-side to the first diagonal branch to the LAD.  This had a 70% proximal stenosis.  It was a 1.5 mm good quality target vessel.  The vein was anastomosed end-to-side with a running 7-0 Prolene suture.  Again, the probe passed easily and there was excellent flow and hemostasis with cardioplegia administration.  Next, the left internal mammary artery was brought through a window in the pericardium.  The distal end was beveled.  It was then anastomosed end-to-side to the distal LAD.  The LAD was a 1.5 mm good quality target vessel.  The mammary was a 2 mm good quality conduit.  The anastomosis was performed end-to-side with a running 8-0 Prolene suture.  At the completion of the mammary to LAD anastomosis, the bulldog clamp was briefly removed to inspect for hemostasis.  Immediate rapid septal rewarming was noted.  The bulldog clamp was replaced.  The mammary pedicle was tacked to the epicardial surface of the heart with 6-0 Prolene sutures.  Rewarming was begun.  Additional cardioplegia was administered.   The vein grafts were cut to length.  The cardioplegic cannula was removed from the ascending aorta and the proximal vein graft anastomosis was performed to a 4.5 mm punch aortotomies with running 6-0 Prolene sutures.  At the completion of the final proximal anastomosis, the patient was placed in Trendelenburg position.  Lidocaine was administered.  The bulldog clamps again removed from the left mammary artery.  The aortic root was de-aired and the aortic crossclamp was removed.  The total crossclamp time was 53 minutes.  The patient spontaneously resumed sinus rhythm and did not require defibrillation.  While rewarming was completed, all proximal and distal anastomoses were inspected for hemostasis.  Epicardial pacing wires were placed on the right ventricle and right atrium.  When the patient had rewarmed to a core temperature of 37 degrees Celsius, she was weaned from cardiopulmonary bypass on the first attempt without difficulty. The total bypass time was 78 minutes.  The initial cardiac index was greater than 2 L/min/m2 and the  patient remained hemodynamically stable throughout the post bypass period.  A test dose of protamine was administered and was well tolerated.  The atrial and aortic cannulae were removed.  The remainder of the protamine was administered without incident.  The chest was irrigated with 1 L of warm normal saline.  Hemostasis was achieved.  The pericardium was reapproximated with interrupted 3-0 silk sutures.  It came together easily without tension or kinking the underlying grafts.  A left pleural and single mediastinal chest tubes were placed in separate subcostal incisions.  The sternum was closed with interrupted heavy gauge double stainless steel wires.  The pectoralis fascia, subcutaneous tissue, and skin were closed in standard fashion.  All sponge, needle, and instrument counts were correct at the end of the procedure.  There were no intraoperative  complications.  The patient was taken from the operating room to the surgical intensive care unit in good condition.     Salvatore Decent Dorris Fetch, M.D.     SCH/MEDQ  D:  08/12/2011  T:  08/13/2011  Job:  [id number]  cc:   Antonieta Iba, MD Jennifer [name]  Electronically Signed by Charlett Lango M.D. on 08/16/2011 03:10:36 PM

## 2011-08-16 NOTE — Consult Note (Signed)
Jennifer [name], Jennifer [name] NO.:  [id number]  MEDICAL RECORD NO.:  [id number]  LOCATION:  2908                         FACILITY:  [facility]  PHYSICIAN:  Salvatore Decent. Dorris Fetch, M.D.DATE OF BIRTH:  [birth year]  DATE OF CONSULTATION:  08/06/2011 DATE OF DISCHARGE:                                CONSULTATION   REASON FOR CONSULTATION:  Severe two-vessel disease with unstable angina.  HISTORY OF PRESENT ILLNESS:  Mr. [name] is a 55 year old woman with history of hypertension, diabetes, and coronary artery disease.  She had a previous coronary stent in 2007 and then also had a stent placed in her LAD in 2011, this was a drug-eluting stent.  She has been on Effient since that time.  She noticed over the past few weeks, she has been feeling extremely tired and having some intermittent chest pain.  She has it periodically at rest as well as with exertion.  On the day of admission, she had a prolonged episode, she took 3 nitroglycerin and finally took a Vicodin, had a small amount of relief, but also was experienced some shortness of breath.  She had been started on Ranexa. She called Dr. Windell Hummingbird office and was advised to go to the emergency room.  She went to the emergency room, was diagnosed with unstable angina and admitted.  She did not have any acute ST-T wave changes. Today, she underwent cardiac catheterization by Dr. Mariah Milling where she was found to have a 95% in-stent restenosis in the LAD.  There was 70% stenosis in the proximal first diagonal, also was a diffuse 90% stenosis in the mid right coronary artery.  LV function was normal.  The patient currently is pain free.  PAST MEDICAL HISTORY:  Significant for: 1. Coronary artery disease. 2. Previous PTCA and stenting. 3. Adult onset type 2 insulin-dependent diabetes. 4. Hypertension. 5. Chronic low back pain. 6. Depression. 7. Anxiety.  PAST SURGICAL HISTORY:  Significant for coronary stenting, back  surgery, removal of breast implants, hysterectomy.  HOME MEDICATIONS: 1. Imdur 30 mg p.o. b.i.d. 2. Ranexa 1000 mg p.o. b.i.d. 3. Norvasc 10 mg daily. 4. Metformin 1000 mg b.i.d. 5. Insulin 70/30 20-27 units subcutaneously twice daily based on CBGs. 6. Norvasc 10 mg daily. 7. Lamictal 200 mg daily. 8. Prozac 10 mg daily. 9. Coreg 12.5 mg b.i.d. 10.Ativan p.r.n. 11.Simvastatin 40 mg daily. 12.Micardis/hydrochlorothiazide 80/12.5 one daily. 13.Symbicort 160/4.5 two puffs b.i.d. 14.Albuterol inhaler as needed. 15.Seroquel 600 mg p.o. at bedtime. 16.Prilosec 40 mg p.o. b.i.d. 17.Effient 10 mg daily. 18.Aspirin 325 mg daily. 19.Lortab one to two tablets every 6 hours p.r.n.  She has no known drug allergies.  FAMILY HISTORY:  Significant for coronary artery disease.  Brother also had a DVT and diabetes, eventually had an amputation.  SOCIAL HISTORY:  Former smoker.  She smoked 2 packs a day.  She quit a year ago.  She lives at home with her husband.  Has chronic pain issues.  REVIEW OF SYSTEMS:  Blurred vision, uses reading glasses, some decrease in hearing.  Positive wheezing, which she uses inhalers, although she denied COPD.  No recent change in bowel or bladder habits.  No claudication.  No stroke or TIA  symptoms.  She does state that she does have a tendency to bleed and has been diagnosed with von Willebrand disease.  All other systems were negative.  PHYSICAL EXAMINATION:  GENERAL:  Jennifer [name] is a 55 year old woman, she is anxious, but in no acute distress.  She is well developed and well nourished. VITAL SIGNS:  Blood pressure is 140/78, pulse is 90 and regular, respirations 16. NEUROLOGIC:  Neurologically, she is alert and oriented x3 with no focal deficits.  She is very anxious. HEENT:  Unremarkable. NECK:  Supple without thyromegaly, adenopathy, or bruits. CARDIAC:  Has regular rate and rhythm.  Normal S1, S2.  No rubs, murmurs, or gallops. LUNGS:  Clear with  equal breath sounds bilaterally.  There is no wheezing. ABDOMEN:  Soft and nontender. EXTREMITIES:  Without clubbing, cyanosis, or edema.  She has 2+ dorsalis pedis, posterior tibial, and radial pulses bilaterally.  She has a normal Allen test on the left.  LABORATORY DATA:  EKG shows no significant ST changes.  Cardiac catheterization as previously noted.  Her white count is 5.8, hematocrit 38, platelet count 288.  Sodium 134, potassium 3.7, BUN 18, creatinine 0.93.  AST mildly elevated at 73, ALT 55.  CK was 78, MB less than 0.5, troponin was less than 0.2.  Cholesterol was 218, triglycerides were 815, HDL was 25.  IMPRESSION:  Jennifer [name] is a 55 year old woman with history of coronary artery disease.  She also has insulin-dependent diabetes, hypertension, severe dyslipidemia, anxiety, and von Willebrand, who presents with unstable angina.  In catheterization, she had severe two- vessel coronary artery disease with an in-stent restenosis in her left anterior descending artery, coronary artery bypass grafting is indicated for revascularization to relief symptoms and preserved myocardial viability.  I discussed in detail with the patient and her husband the indications, risks, benefits, and alternative treatments, they understand the risks include but not limited to death, stroke, myocardial infarction, deep venous thrombosis, pulmonary embolism, bleeding, possible need for transfusions, infections as well as other organ system dysfunction including respiratory, renal, hepatic or GI complications.  She understands and accepts these risks and wished to proceed.  She has been on Effient and in combination with von Willebrand issues, would be extraordinarily high risk for bleeding complications with surgery.  We will check a P2Y12 assay to assess her degree of platelet inhibition, but at this point, anticipate surgery on Monday, August 12, 2011.  Thank you very  much.     Salvatore Decent Dorris Fetch, M.D.     SCH/MEDQ  D:  08/06/2011  T:  08/07/2011  Job:  [id number]  cc:   Antonieta Iba, MD Cay Schillings  Electronically Signed by Charlett Lango M.D. on 08/16/2011 03:09:49 PM

## 2011-08-17 LAB — GLUCOSE, CAPILLARY: Glucose-Capillary: 101 mg/dL — ABNORMAL HIGH (ref 70–99)

## 2011-08-21 NOTE — Discharge Summary (Signed)
Jennifer [name], Jennifer [name] NO.:  [id number]  MEDICAL RECORD NO.:  [id number]  LOCATION:  2017                         FACILITY:  [facility]  PHYSICIAN:  Salvatore Decent. Dorris Fetch, M.D.DATE OF BIRTH:  Apr 21, 1956  DATE OF ADMISSION:  08/06/2011 DATE OF DISCHARGE:  08/17/2011                              DISCHARGE SUMMARY   HISTORY:  The patient is a 55 year old female with a previous history of coronary artery disease dating back to 2007 at which time, she had a stent placed.  Additionally, in 2011, she had a drug-eluting stent to the LAD.  She has been on Effient since that time.  Over the past few weeks prior to this evaluation, she has been feeling extremely tired and having intermittent chest pain.  She has had this periodically at rest as well as with exertion.  On the day of admission, she had a prolonged episode and took 3 nitroglycerin in addition to a Vicodin with a small amount of relief, but she also noted shortness of breath.  She had been started on Ranexa.  She called Dr. Windell Hummingbird office and was advised to go to the emergency department.  She presented to the [facility], where she was diagnosed with unstable angina.  She did not have any acute ST-T wave segment changes.  On October 9, she underwent cardiac catheterization by Dr. Mariah Milling where she was found to have a 95% in-stent restenosis in the LAD as well as a 70% stenosis of the 1st diagonal and had diffuse 90% stenosis in the mid right coronary artery.  Left ventricular function was normal.  She was transferred to [facility] for surgical opinion.  She was seen in consultation by Dr. Harle Battiest.  He evaluated the patient and her studies and agreed with recommendations to proceed with surgical revascularization.  She did have some platelet inhibition from her long-term Effient use and required a period of time for washout.  She was managed by the cardiologist prior to proceeding with  surgery.  PAST MEDICAL HISTORY:  Significant for 1. Coronary artery disease. 2. Previous PTCA and stenting. 3. Adult onset diabetes mellitus type 2. 4. Hypertension. 5. Chronic low back pain. 6. Depression. 7. Anxiety.  PAST SURGICAL HISTORY:  Includes coronary stenting, back surgery, removal of breast implants and hysterectomy.  HOME MEDICATIONS:  Include 1. Imdur 30 mg p.o. b.i.d. 2. Ranexa 1000 mg p.o. b.i.d. 3. Norvasc 10 mg daily. 4. Metformin 1000 mg b.i.d. 5. Insulin 70/30, 20-27 units subcutaneously twice daily based on     CBGs. 6. Norvasc 10 mg daily. 7. Lamictal 200 mg daily. 8. Prozac 10 mg daily. 9. Coreg 12.5 mg b.i.d. 10.Ativan p.r.n. 11.Simvastatin 40 mg daily. 12.Micardis/hydrochlorothiazide 80/12.5 mg 1 daily. 13.Symbicort 160/4.5, 2 puffs b.i.d. 14.Albuterol inhaler p.r.n. 15.Seroquel 600 mg p.o. bedtime. 16.Prilosec 40 mg b.i.d. 17.Effient 10 mg daily. 18.Aspirin 325 mg daily. 19.Lortab 1 or 2 every 6 hours p.r.n.  ALLERGIES:  No known drug allergies.  FAMILY HISTORY:  Significant for coronary artery disease.  She has a brother has history of DVT and diabetes eventually requiring amputation.  SOCIAL HISTORY:  She is a former smoker.  She quit 1 year ago.  She smoked 2 packs a day.  She lives at home with her husband.  He has chronic pain issues.  REVIEW OF SYMPTOMS:  Please see the history and physical.  PHYSICAL EXAM:  Please see the history and physical.  HOSPITAL COURSE:  The patient was stabilized and monitored closely medically.  She was additionally placed on heparin.  She was felt to be medically stable to proceed with surgery on August 12, 2011 at which time she underwent the following procedure by Dr. Dorris Fetch coronary artery bypass grafting times 3, the following grafts were placed #1. Left internal mammary artery to the LAD, #2.  Saphenous vein graft to the right coronary artery, #3.  Saphenous vein graft to diagonal.   She tolerated procedure well and was taken to the Surgical Intensive Care unit in stable condition.  POSTOPERATIVE HOSPITAL COURSE:  Patient has progressed overall nicely. She has been managed aggressively in terms of her diabetes with initial glucose manner protocols and transition to oral and her home regimen. She has had all routine lines, monitors, drainage device was discontinued in the standard protocols.  She was weaned from the ventilator 6-1/2 hours post surgery.  She has remained neurologically intact.  She has had some postoperative atelectasis but this is improving with time and her saturations are good on room air.  She has an acute blood loss anemia with stable values.  Her most recent hemoglobin and hematocrit on August 15, 2011 are 8 and 25 respectively. Electrolytes, BUN and creatinine are within normal limits.  She has a moderate postoperative volume overload which has responded well to diuresis.  She is currently below the level of her preoperative weight. Her incisions are healing well without evidence of infection.  She is tolerating routine advancement activity using standard cardiac surgical protocols.  She is deemed to be acceptable for discharge on today's date, August 17, 2011.  CONDITION ON DISCHARGE:  Stable improved.  MEDICATIONS ON DISCHARGE:  Include the following 1. Guaifenesin 600 mg 1 every 12 hours p.r.n. 2. Iron complex 150 mg daily. 3. Oxycodone 1-2 every 4-6 hours p.r.n. 4. TriCor 145 mg daily. 5. Coreg 3.125 mg twice daily. 6. Lamictal 200 mg 1 tablet daily. 7. Aspirin enteric-coated 325 mg daily. 8. Combivent 2 puffs every 4 hours p.r.n. 9. Cymbalta 60 mg 2 capsules by mouth daily. 10.Humulin 70/30 insulin 20-27 units twice daily. 11.Metformin 1000 mg twice daily. 12.Omeprazole 40 mg twice daily. 13.Prozac 10 mg daily. 14.Seroquel 600 mg daily at bedtime. 15.Simvastatin 40 mg daily. 16.Symbicort 160/4.5 mcg 2 puffs twice  daily. 17.Valium 5 mg q.8 hours p.r.n.  HOME INSTRUCTIONS:  The patient will receive written instructions regarding medications, activity, diet, wound care, and followup.  FOLLOWUP:  Include Dr. Mariah Milling in 2 weeks post discharge and Dr. Dorris Fetch on September 12, 2011 at 1:30 p.m.  FINAL DIAGNOSIS:  Severe two-vessel coronary artery disease with unstable angina, now status post surgical revascularization as described.  OTHER DIAGNOSES:  Include 1. Postoperative acute blood-loss anemia. 2. Expected postoperative volume overload resolved. 3. Postoperative atelectasis resolved. 4. Small postoperative pleural effusion. 5. History of previous percutaneous transluminal coronary angioplasty     and stenting. 6. History of diabetes mellitus type 2, currently insulin dependent. 7. History of tobacco abuse. 8. History of hypertension. 9. History of chronic obstructive pulmonary disease. 10.History of chronic low back pain. 11.History of depression. 12.History of anxiety.     Rowe Clack, P.A.-C.   ______________________________ Salvatore Decent Dorris Fetch, M.D.    Sherryll Burger  D:  08/17/2011  T:  08/17/2011  Job:  [id number]  cc:   Antonieta Iba, MD Cay Schillings  Electronically Signed by Gershon Crane P.A.-C. on 08/20/2011 02:00:53 PM Electronically Signed by Charlett Lango M.D. on 08/21/2011 04:41:23 PM

## 2011-08-29 ENCOUNTER — Encounter: Payer: Self-pay | Admitting: Thoracic Surgery (Cardiothoracic Vascular Surgery)

## 2011-09-02 ENCOUNTER — Telehealth: Payer: Self-pay | Admitting: *Deleted

## 2011-09-02 NOTE — Telephone Encounter (Signed)
Pt's son called this AM stating since pt's bypass surgery (10/15 by Dr. Dorris Fetch), pt has had increased dizziness, and "blackout episodes," she describes it but has not actually passed out. Pt states BP has been 140s-150s, today it is 146/98. Did not give HR results. Pt's son wants her to be seen in office and pt's husband wants to be with her. Pt was scheduled to see TG tomorrow at 2:30, pt ok with this. Pt was advised to bring BP and HR numbers with her and documentation of symptoms and what she is doing when feels dizzy and time of day.

## 2011-09-03 ENCOUNTER — Encounter: Payer: Self-pay | Admitting: Cardiovascular Disease

## 2011-09-03 ENCOUNTER — Ambulatory Visit (INDEPENDENT_AMBULATORY_CARE_PROVIDER_SITE_OTHER): Admitting: Cardiovascular Disease

## 2011-09-03 DIAGNOSIS — I10 Essential (primary) hypertension: Secondary | ICD-10-CM

## 2011-09-03 DIAGNOSIS — E785 Hyperlipidemia, unspecified: Secondary | ICD-10-CM

## 2011-09-03 DIAGNOSIS — I2510 Atherosclerotic heart disease of native coronary artery without angina pectoris: Secondary | ICD-10-CM

## 2011-09-03 DIAGNOSIS — Z951 Presence of aortocoronary bypass graft: Secondary | ICD-10-CM

## 2011-09-03 DIAGNOSIS — R55 Syncope and collapse: Secondary | ICD-10-CM

## 2011-09-03 DIAGNOSIS — M549 Dorsalgia, unspecified: Secondary | ICD-10-CM

## 2011-09-03 DIAGNOSIS — R42 Dizziness and giddiness: Secondary | ICD-10-CM

## 2011-09-03 MED ORDER — MECLIZINE HCL 32 MG PO TABS: 32.0000 mg | ORAL_TABLET | Freq: Three times a day (TID) | ORAL | Status: DC | PRN

## 2011-09-03 NOTE — Patient Instructions (Signed)
  Try meclizine for dizziness up to three times a day as needed  Please call us if you have new issues that need to be addressed before your next appt.  The office will contact you for a follow up Appt. In 6 months

## 2011-09-04 ENCOUNTER — Other Ambulatory Visit: Payer: Self-pay

## 2011-09-04 DIAGNOSIS — R42 Dizziness and giddiness: Secondary | ICD-10-CM | POA: Insufficient documentation

## 2011-09-04 DIAGNOSIS — M549 Dorsalgia, unspecified: Secondary | ICD-10-CM | POA: Insufficient documentation

## 2011-09-04 NOTE — Assessment & Plan Note (Signed)
Bypass surgery on October 15, doing well. We have stressed the importance of smoking cessation, statin, aspirin. She will start cardiac rehabilitation when we have a handle on her dizziness. 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

## 2011-09-04 NOTE — Assessment & Plan Note (Signed)
We spent much of the visit talking about the etiology of her spinning and dizziness. It appears to be positional. We had suggested she try meclizine. If this does not work, we will set up an appointment with ear nose throat for further evaluation. She does not appear to be orthostatic. She does not appear to be on any new medications since her discharge, and in fact is off all of her blood pressure medication.

## 2011-09-04 NOTE — Progress Notes (Signed)
Patient ID: Jennifer [name], female    DOB: 11/16/1955, 55 y.o.   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  She was referred to [facility] for bypass surgery which was performed on October 15 by Dr. Dorris Fetch. She had bypass graft x3 with a LIMA to the LAD, vein graft to the RCA, vein graft to the diagonal.  Postoperatively, she reports that she has been doing well apart from her back pain. She has had episodes of dizziness that started 4 days after her discharge and has continued for the past 2 weeks. It appears to be positional, happens when she is in bed and sits up, happens when she walks. There was some discharge medication issue and she was given a prescription for the wrong pain medication for her back. She did not fill this and is waiting to go back to Dr. Sheppard Penton to restart her Vicodin which is the only thing that works for her. Chest pain has been adequate though her back pain has been significant.  She denies any significant shortness of breathDoes have significant fatigue postoperatively.  Blood pressures in the clinic did not show severe orthostasis. Lying down blood pressure show systolic pressure 132, sitting up blood pressure is 114, standing is 113, 5 minutes later blood pressure systolic is 114. There is clearly a positional component to her "spinning".  EKG today shows normal sinus rhythm with rate 85 beats per minute with no significant ST or T wave  changes   Outpatient Encounter Prescriptions as of 09/03/2011  Medication Sig Dispense Refill  . albuterol-ipratropium (COMBIVENT) 18-103 MCG/ACT inhaler Inhale 2 puffs into the lungs every 6 (six) hours as needed.        Marland Kitchen aspirin 325 MG tablet Take 325 mg by mouth daily.        . budesonide-formoterol (SYMBICORT) 160-4.5 MCG/ACT inhaler Inhale 2 puffs into the lungs 2 (two) times daily.        . carvedilol (COREG) 3.125 MG tablet Take 3.125 mg by mouth 2 (two) times daily with a meal.        . diazepam (VALIUM) 5 MG tablet Take 5 mg by mouth every 8 (eight) hours as needed.        . DULoxetine (CYMBALTA) 60 MG capsule Take 120 mg by mouth daily.        . fenofibrate (TRICOR) 145 MG tablet Take 145 mg by mouth daily.        Marland Kitchen FLUoxetine (PROZAC) 10 MG tablet Take 10 mg by mouth daily.        . insulin lispro protamine-insulin lispro (HUMALOG 75/25) (75-25) 100 UNIT/ML SUSP Inject into the skin as directed.        . iron polysaccharides (NIFEREX) 150 MG capsule Take 150 mg by mouth 1 day or 1 dose.        . lamoTRIgine (LAMICTAL) 200 MG tablet Take 200 mg by mouth daily.        Marland Kitchen LORazepam (ATIVAN) 1 MG  tablet Take 1 mg by mouth every 8 (eight) hours.        . metFORMIN (GLUMETZA) 1000 MG (MOD) 24 hr tablet Take 1,000 mg by mouth 2 (two) times daily with a meal.        . omeprazole (PRILOSEC) 40 MG capsule Take 40 mg by mouth 2 (two) times daily.        . QUEtiapine (SEROQUEL XR) 300 MG 24 hr tablet Take 300 mg by mouth at bedtime.        . simvastatin (ZOCOR) 40 MG tablet Take 40 mg by mouth at bedtime.          Review of Systems  Constitutional: Positive for fatigue.  HENT: Negative.   Eyes: Negative.   Respiratory: Negative.   Cardiovascular: Positive for chest pain.  Gastrointestinal: Negative.   Musculoskeletal: Positive for back pain.  Skin: Negative.   Neurological: Positive for dizziness and weakness.  Hematological: Negative.   Psychiatric/Behavioral: Positive for sleep  disturbance. The patient is nervous/anxious.   All other systems reviewed and are negative.   BP 114/79  Pulse 85  Ht 5\' 3"  (1.6 m)  Wt 169 lb (76.658 kg)  BMI 29.94 kg/m2   Physical Exam  Nursing note and vitals reviewed. Constitutional: She is oriented to person, place, and time. She appears well-developed and well-nourished.  HENT:  Head: Normocephalic.  Nose: Nose normal.  Mouth/Throat: Oropharynx is clear and moist.  Eyes: Conjunctivae are normal. Pupils are equal, round, and reactive to light.  Neck: Normal range of motion. Neck supple. No JVD present.  Cardiovascular: Normal rate, regular rhythm, S1 normal, S2 normal, normal heart sounds and intact distal pulses.  Exam reveals no gallop and no friction rub.   No murmur heard.      Well-healed mediastinal scar  Pulmonary/Chest: Effort normal and breath sounds normal. No respiratory distress. She has no wheezes. She has no rales. She exhibits no tenderness.  Abdominal: Soft. Bowel sounds are normal. She exhibits no distension. There is no tenderness.  Musculoskeletal: Normal range of motion. She exhibits no edema and no tenderness.  Lymphadenopathy:    She has no cervical adenopathy.  Neurological: She is alert and oriented to person, place, and time. Coordination normal.  Skin: Skin is warm and dry. No rash noted. No erythema.  Psychiatric: She has a normal mood and affect. Her behavior is normal. Judgment and thought content normal.         Assessment and Plan

## 2011-09-04 NOTE — Assessment & Plan Note (Signed)
We would recommend she stay on her Vicodin. She has not filled any other prescriptions for pain medications from the hospital. We will refer her back to Dr. Sheppard Penton who knows her well. She reports adequate pain control on Vicodin where other medications do not work.

## 2011-09-04 NOTE — Assessment & Plan Note (Addendum)
Blood pressure is well controlled on today's visit. No changes made to the medications. Most of her medications have been stopped. Blood pressure is well-controlled without any significant medications.

## 2011-09-04 NOTE — Telephone Encounter (Signed)
The pharmacy states, the dose for meclizine comes in 12.5 mg and 25 mg.  Which dose do want me to send to pharmacy?

## 2011-09-04 NOTE — Assessment & Plan Note (Signed)
Total cholesterol goal less than 150, LDL less than 70.

## 2011-09-05 ENCOUNTER — Telehealth: Payer: Self-pay

## 2011-09-05 NOTE — Telephone Encounter (Signed)
Spoke to pharmacy regarding meclizine 25 mg take one tablet tid prn.

## 2011-09-05 NOTE — Telephone Encounter (Signed)
Duplicate message. 

## 2011-09-10 ENCOUNTER — Other Ambulatory Visit: Payer: Self-pay | Admitting: Thoracic Surgery (Cardiothoracic Vascular Surgery)

## 2011-09-10 DIAGNOSIS — I2510 Atherosclerotic heart disease of native coronary artery without angina pectoris: Secondary | ICD-10-CM

## 2011-09-12 ENCOUNTER — Ambulatory Visit (INDEPENDENT_AMBULATORY_CARE_PROVIDER_SITE_OTHER): Payer: Self-pay | Admitting: Thoracic Surgery (Cardiothoracic Vascular Surgery)

## 2011-09-12 ENCOUNTER — Ambulatory Visit
Admission: RE | Admit: 2011-09-12 | Discharge: 2011-09-12 | Disposition: A | Discharge status: Home / Self Care | Payer: PRIVATE HEALTH INSURANCE | Source: Ambulatory Visit | Attending: Thoracic Surgery (Cardiothoracic Vascular Surgery) | Admitting: Thoracic Surgery (Cardiothoracic Vascular Surgery)

## 2011-09-12 ENCOUNTER — Ambulatory Visit: Payer: PRIVATE HEALTH INSURANCE | Admitting: Thoracic Surgery (Cardiothoracic Vascular Surgery)

## 2011-09-12 ENCOUNTER — Encounter: Payer: Self-pay | Admitting: Thoracic Surgery (Cardiothoracic Vascular Surgery)

## 2011-09-12 VITALS — BP 128/87 | HR 88 | Resp 20 | Ht 63.0 in | Wt 165.0 lb

## 2011-09-12 DIAGNOSIS — I2510 Atherosclerotic heart disease of native coronary artery without angina pectoris: Secondary | ICD-10-CM

## 2011-09-12 DIAGNOSIS — Z951 Presence of aortocoronary bypass graft: Secondary | ICD-10-CM

## 2011-09-12 NOTE — Progress Notes (Signed)
PCP is Andree Elk, MD Referring Provider is Antonieta Iba, MD  Chief Complaint  Patient presents with  . Routine Post Op    3 week f/u from surgery with CXR, S/P CABG x 3, 08/12/11    HPI: Jennifer [name] returns today for a scheduled postoperative followup visit. She had coronary bypass grafting x3 on October 15. She was slow to progress afterwards and remained hospitalized for about 10 days. She returns today with multiple complaints. She complains of dizziness, sometimes accompanied by seeing lights, anxiety, depression, memory loss. She says she's having bouts of extreme anxiety, and also episodes of tearfulness. She is having difficulty with short-term memory. According to her husband she's been very argumentative, and also has had issues with them medication compliance. On several occasions he is noted that she's taken multiple days worth of medications a single day. She states that she easily loses her train of thought. She has mild to moderate incisional pain. No recurrent angina.  Past Medical History  Diagnosis Date  . GERD (gastroesophageal reflux disease)   . Hyperlipidemia   . Hypertension   . Enlarged liver   . Diabetes mellitus   . Low back pain   . Depression with anxiety   . CAD (coronary artery disease)     Past Surgical History  Procedure Date  . Cardiac catheterization     CAD,PCI of LAD, Cypher stent 2.5-28mm  . Cardiac catheterization     PCI of mid-LAD in stent restenosis with a drug-eluting stent  . Breast implant removal   . Coronary artery bypass graft  08-12-2011    CABG x 3    Family History  Problem Relation Age of Onset  . Hypertension Other     family hx  . Hyperlipidemia Other     family hx  . Diabetes Other     family hx  . Aneurysm Father     abdominal  . Diabetes Father   . Aneurysm Brother     abdominal  . Diabetes Mother     Social History History  Substance Use Topics  . Smoking status: Former Smoker -- 16 years    Types:  Cigarettes    Quit date: 06/20/2010  . Smokeless tobacco: Never Used  . Alcohol Use: No    Current Outpatient Prescriptions  Medication Sig Dispense Refill  . aspirin 325 MG tablet Take 325 mg by mouth daily.        . carvedilol (COREG) 3.125 MG tablet Take 3.125 mg by mouth 2 (two) times daily with a meal.        . diazepam (VALIUM) 5 MG tablet Take 5 mg by mouth every 8 (eight) hours as needed.        . DULoxetine (CYMBALTA) 60 MG capsule Take 120 mg by mouth daily.        . fenofibrate (TRICOR) 145 MG tablet Take 145 mg by mouth daily.        Marland Kitchen FLUoxetine (PROZAC) 10 MG tablet Take 10 mg by mouth daily.        Marland Kitchen HYDROcodone-acetaminophen (LORTAB) 7.5-500 MG per tablet Take 1 tablet by mouth every 6 (six) hours as needed.        . insulin lispro protamine-insulin lispro (HUMALOG 75/25) (75-25) 100 UNIT/ML SUSP Inject into the skin as directed.        . iron polysaccharides (NIFEREX) 150 MG capsule Take 150 mg by mouth 1 day or 1 dose.        Marland Kitchen  lamoTRIgine (LAMICTAL) 200 MG tablet Take 200 mg by mouth daily.        . meclizine (ANTIVERT) 25 MG tablet Take 25 mg by mouth 3 (three) times daily as needed.        . metFORMIN (GLUMETZA) 1000 MG (MOD) 24 hr tablet Take 1,000 mg by mouth 2 (two) times daily with a meal.        . omeprazole (PRILOSEC) 40 MG capsule Take 40 mg by mouth 2 (two) times daily.        . QUEtiapine (SEROQUEL XR) 300 MG 24 hr tablet Take 300 mg by mouth at bedtime. 2 tablets every hs      . simvastatin (ZOCOR) 40 MG tablet Take 40 mg by mouth at bedtime.          No Known Allergies  Review of Systems: See history of present illness  BP 128/87  Pulse 88  Resp 20  Ht 5\' 3"  (1.6 m)  Wt 165 lb (74.844 kg)  BMI 29.23 kg/m2  SpO2 94% Physical Exam: Gen. very anxious,tearful 55 year old woman Neurologic : Nonfocal Sternal incision clean, dry and intact, sternum stable Cardiac regular rate and rhythm, no rub Lungs clear, equal breath sounds  bilaterally  Diagnostic Tests: Chest x-ray shows no active disease, there has been resolution of basilar atelectasis and bilateral effusions  Impression: 55 year old woman status post coronary bypass grafting a month ago. She does have a history of anxiety and depression. She has multiple complaints, is really having a very difficult time with her recovery. She is very labile emotionally. She does have multiple stressors in addition to her surgical recovery. She repeatedly talked about how her brothers have not called were checked on her since her surgery, she became very agitated and cried when discussing this is. Her husband is with her says that she is very reluctant at for help as well as noting a medication mixups.  This difficulty to know how much her psychiatric medications and issues may be contributing to her current issues. I did spend a great deal of time with her husband talking over these issues. It is not at all uncommon for people to experience anxiety and depression after her major illnesses or operations, and it may be that she is just experiencing a very dramatic variation of a general theme. I can't completely rule out that she could have had some type of neurologic event, but her exam is nonfocal and the episodes are very repetitive. Her episodes also seemed to be highly related to outside stressors.  After the patient and her husband the option of admitting her to do testing and a neurology consult. They both were reluctant to take that route. She wishes to give this some more time and followup as an outpatient.  Plan: I will see her back in 2 weeks to check on her progress

## 2011-09-25 ENCOUNTER — Ambulatory Visit (INDEPENDENT_AMBULATORY_CARE_PROVIDER_SITE_OTHER): Payer: Self-pay | Admitting: Thoracic Surgery (Cardiothoracic Vascular Surgery)

## 2011-09-25 ENCOUNTER — Encounter: Payer: Self-pay | Admitting: Thoracic Surgery (Cardiothoracic Vascular Surgery)

## 2011-09-25 VITALS — BP 107/69 | HR 88 | Resp 16 | Ht 63.0 in | Wt 165.0 lb

## 2011-09-25 DIAGNOSIS — Z951 Presence of aortocoronary bypass graft: Secondary | ICD-10-CM

## 2011-09-25 DIAGNOSIS — I2510 Atherosclerotic heart disease of native coronary artery without angina pectoris: Secondary | ICD-10-CM

## 2011-09-25 NOTE — Progress Notes (Signed)
Jennifer [name] returns today for followup she had coronary bypass grafting x3 on October 15. I saw her in the office 2 weeks ago. At that time she was really doing poorly complaining of shortness of breath, incisional pain, dizziness, short-term memory loss, depression, and general malaise. Her husband was whether it noted that she had been sporadically taking her medications. At times she would skip doses and other times she would take multiple doses of one time. She also was dealing with some family issues regarding her brothers. At that time I offered her hospital admission workup some of these problems and felt like that was the best option, but she refused.  She returns today with similar complaints although both she and her husband have noted improvement over the past 2 weeks. Her primary pain at this point starts in the upper right back and radiates in a dermatomal distribution to the right breast. She's not having any direct sternal pain. Her exercise tolerance has improved. Of note her husband states that 3 days ago she had a visit from her brother and he thinks she has improved dramatically since that time. Her simvastatin was changed to atorvastatin. She stopped taking her meclizine. Her Prozac this is been increased to 20 mg daily. She's not having anginal type pain. She is still relatively weak and tired. She still complains of short-term memory loss, but can recount details in the past 2 weeks without difficulty.  Past Medical History  Diagnosis Date  . GERD (gastroesophageal reflux disease)   . Hyperlipidemia   . Hypertension   . Enlarged liver   . Diabetes mellitus   . Low back pain   . Depression with anxiety   . CAD (coronary artery disease)     Current outpatient prescriptions:aspirin 325 MG tablet, Take 325 mg by mouth daily.  , Disp: , Rfl: ;  carvedilol (COREG) 3.125 MG tablet, Take 3.125 mg by mouth 2 (two) times daily with a meal.  , Disp: , Rfl: ;  diazepam (VALIUM) 5 MG  tablet, Take 5 mg by mouth every 8 (eight) hours as needed.  , Disp: , Rfl: ;  DULoxetine (CYMBALTA) 60 MG capsule, Take 120 mg by mouth daily.  , Disp: , Rfl:  fenofibrate (TRICOR) 145 MG tablet, Take 145 mg by mouth daily.  , Disp: , Rfl: ;  FLUoxetine (PROZAC) 10 MG tablet, Take 20 mg by mouth daily. , Disp: , Rfl: ;  HYDROcodone-acetaminophen (LORTAB) 7.5-500 MG per tablet, Take 1 tablet by mouth every 6 (six) hours as needed.  , Disp: , Rfl: ;  insulin lispro protamine-insulin lispro (HUMALOG 75/25) (75-25) 100 UNIT/ML SUSP, Inject into the skin as directed.  , Disp: , Rfl:  iron polysaccharides (NIFEREX) 150 MG capsule, Take 150 mg by mouth 1 day or 1 dose.  , Disp: , Rfl: ;  lamoTRIgine (LAMICTAL) 200 MG tablet, Take 200 mg by mouth daily.  , Disp: , Rfl: ;  meclizine (ANTIVERT) 25 MG tablet, Take 25 mg by mouth 3 (three) times daily as needed.  , Disp: , Rfl: ;  metFORMIN (GLUMETZA) 1000 MG (MOD) 24 hr tablet, Take 1,000 mg by mouth 2 (two) times daily with a meal.  , Disp: , Rfl:  omeprazole (PRILOSEC) 40 MG capsule, Take 40 mg by mouth 2 (two) times daily.  , Disp: , Rfl: ;  QUEtiapine (SEROQUEL XR) 300 MG 24 hr tablet, Take 300 mg by mouth at bedtime. 2 tablets every hs, Disp: , Rfl:  On exam BP 107/69  Pulse 88  Resp 16  Ht 5\' 3"  (1.6 m)  Wt 165 lb (74.844 kg)  BMI 29.23 kg/m2  SpO2 96% Gen. she is much more animated been previously although there is still some flatness to the affect. She has no difficulty carry-on conversation Lungs are clear with equal breath sounds Cardiac is regular rate and rhythm, normal S1 and S2 Sternum stable Sternal and leg incisions clean dry and intact No edema  Impression: Jennifer [name] is now about 5 weeks out from coronary bypass grafting. She's had problems with anxiety and depression, which was pre-existing problems exacerbated by the surgery. She also had issues regarding short-term memory loss, which is new since her surgery. My guess is that these  problems are multifactorial. Certainly the stress of surgery, and the cardiopulmonary bypass could be playing a significant role in her memory issues. however, I think that her medications and emotional issues have exacerbated thinks to a large degree. In any event she looks dramatically better than she did just 2 weeks ago.  I would expect that she will continue to show signs of improvement, and advised her that recover from this type surgery to be a 3-6 month process, and to try not to get frustrated. Also told her that improvement since the common in increments rather than being a steady gradual process.  Regarding her pain, it has I nerve root character and distribution. Was possible she could have a cracked rib on the right side from sternal retraction, it's also possible this could be related to her previous back issues. She is taking hydrocodone for the pain, I advised her that would be okay to take Advil or another anti-inflammatory in addition to that.  Plan: I plan to see her back in 3 weeks to check on her progress.

## 2011-10-16 ENCOUNTER — Ambulatory Visit (INDEPENDENT_AMBULATORY_CARE_PROVIDER_SITE_OTHER): Payer: Self-pay | Admitting: Thoracic Surgery (Cardiothoracic Vascular Surgery)

## 2011-10-16 ENCOUNTER — Encounter: Payer: Self-pay | Admitting: Thoracic Surgery (Cardiothoracic Vascular Surgery)

## 2011-10-16 VITALS — BP 118/80 | HR 86 | Resp 16 | Ht 63.0 in | Wt 165.0 lb

## 2011-10-16 DIAGNOSIS — I2510 Atherosclerotic heart disease of native coronary artery without angina pectoris: Secondary | ICD-10-CM

## 2011-10-16 DIAGNOSIS — Z951 Presence of aortocoronary bypass graft: Secondary | ICD-10-CM

## 2011-10-16 NOTE — Progress Notes (Signed)
HPI:  [name] returns today for followup visit she had coronary bypass grafting x3 on October 15. She had a difficult time from an emotional standpoint postoperatively. Medically she's done quite well. She does have a history of depression and is on relatively high doses of multiple psychiatric medications including Valium, Seroquel, Prozac, Cymbalta and Lamictal. She returns for her first postoperative visit she was having severe anxiety and memory loss, had disheveled appearance and I was really concerned with her well-being as well as her safety. In fact I wanted to admit her at that time but she refused. She returned couple weeks later and had made some progress. It has been 3 weeks since that visit she now returns for another scheduled visit.  She states that she's having pain she describes as a burning sensation in the upper right chest adjacent to the sternum and now is at a similar pain on the left side at the same level (approximately the third costal cartilage). She's not handle type pain. Her husband states that her short-term memory issues have dramatically improved. They've currently lost their therapist, the one that previously used and who was riding her prescriptions for all these medications has moved to another job and they don't have a new one yet. She is requesting a refill on her Valium prescription, she's currently taking 10 mg twice daily.  Current Outpatient Prescriptions  Medication Sig Dispense Refill  . aspirin 325 MG tablet Take 325 mg by mouth daily.        . carvedilol (COREG) 3.125 MG tablet Take 3.125 mg by mouth 2 (two) times daily with a meal.        . diazepam (VALIUM) 5 MG tablet Take 5 mg by mouth every 8 (eight) hours as needed.        . DULoxetine (CYMBALTA) 60 MG capsule Take 120 mg by mouth daily.        . fenofibrate (TRICOR) 145 MG tablet Take 145 mg by mouth daily.        Marland Kitchen FLUoxetine (PROZAC) 10 MG tablet Take 20 mg by mouth daily.       Marland Kitchen  HYDROcodone-acetaminophen (LORTAB) 7.5-500 MG per tablet Take 1 tablet by mouth every 6 (six) hours as needed.        . insulin lispro protamine-insulin lispro (HUMALOG 75/25) (75-25) 100 UNIT/ML SUSP Inject into the skin as directed.        . iron polysaccharides (NIFEREX) 150 MG capsule Take 150 mg by mouth 1 day or 1 dose.        . lamoTRIgine (LAMICTAL) 200 MG tablet Take 200 mg by mouth daily.        . meclizine (ANTIVERT) 25 MG tablet Take 25 mg by mouth 3 (three) times daily as needed.        . metFORMIN (GLUMETZA) 1000 MG (MOD) 24 hr tablet Take 1,000 mg by mouth 2 (two) times daily with a meal.        . omeprazole (PRILOSEC) 40 MG capsule Take 40 mg by mouth 2 (two) times daily.        . QUEtiapine (SEROQUEL XR) 300 MG 24 hr tablet Take 300 mg by mouth at bedtime. 2 tablets every hs        Physical Exam BP 118/80  Pulse 86  Resp 16  Ht 5\' 3"  (1.6 m)  Wt 165 lb (74.844 kg)  BMI 29.23 kg/m2  SpO2 21% Gen. 55 year old woman in no acute distress, much more conversant  and appropriate in affect and behavior Lungs clear with equal breath sounds bilaterally Cardiac regular rate and rhythm normal S1 and S2 Sternum stable, mild tenderness to palpation at the second and third costal cartilage area.  Diagnostic Tests: None  Impression: Jennifer [name] continues to improve following coronary bypass grafting. Her appearance, affect, and behavior are dramatically improved. She claims to still have some issues with short-term memory, but her husband says this is much improved. She says that her children the ones telling her that her memory is faulty. Certainly it is dramatically improved from a clinical standpoint time for her first postoperative visit.  I think this point she is stable from a cardiac standpoint. She does still have some discomfort which is most consistent with costochondritis. I recommended she try Aleve 200 mg by mouth twice a day for 2 weeks, she continue to use the Vicodin as  needed for pain control.    Plan: Aleve 200 mg by mouth twice a day Given prescription for Valium 10 mg tablets, one by mouth twice a day, dispense 60, no refills, she will followup that prescription with her new therapist once that has been arranged.  She'll be followed from a cardiac standpoint by Dr. Julien Nordmann.  I will be happy to see her back at any time that can be of any further assistance with her care.

## 2011-10-23 ENCOUNTER — Telehealth: Payer: Self-pay

## 2011-10-23 NOTE — Telephone Encounter (Signed)
She had gone to her follow up with Dr. Dorris Fetch on 10/16/2011; she was c/o pain on the right side of her chest with right arm pain and was told by Dr. Dorris Fetch could be costochondritis. He told her to take aleve 200 mg twice a day and gave her a Rx for valium 10 mg one tablet twice a day. She is calling today because she is not feeling any better from the aleve 200 mg twice a day. She was told if the pain continues to follow up with Dr. Mariah Milling. I suggested that she folllow up with her PCP and or the urgent care. She would like to have Dr. Mariah Milling call her back regarding her pain. If the pain continues she will follow up with PCP.

## 2011-10-23 NOTE — Telephone Encounter (Signed)
She had gone to her follow up with Dr. Hendrickson on 10/16/2011; she was c/o pain on the right side of her chest with right arm pain and was told by Dr. Hendrickson could be costochondritis. He told her to take aleve 200 mg twice a day and gave her a Rx for valium 10 mg one tablet twice a day. She is calling today because she is not feeling any better from the aleve 200 mg twice a day. She was told if the pain continues to follow up with Dr. Gollan. I suggested that she folllow up with her PCP and or the urgent care. She would like to have Dr. Gollan call her back regarding her pain. If the pain continues she will follow up with PCP.   

## 2011-10-27 NOTE — Telephone Encounter (Signed)
Do you mind calling to see how pain has been the past week. Does she have follow up with PMD? Does she need pain clinic? Unlikely to be heart related given atypical nature and post CABG with good plumbing.

## 2011-10-28 NOTE — Telephone Encounter (Signed)
Spoke to pt, her pain has worsened this past week; the aleve Dr. Dorris Fetch gave her is not helping at all. The pain is "about 3 in in sternum, goes up to right breast and under right arm." She has to walk around holding it. Notified unlikely heart related and pt understands, just does not know what to do about pain. She is going to notify Dr. Dorris Fetch the Aleve is not helping and she will contact her PMD today. Pt is leaving out of town to help her daughter out and is concerned about this as well with not having her doctors nearby. She is leaving in next 1-2 weeks.

## 2011-10-31 NOTE — Telephone Encounter (Signed)
Could consider a chest CT? See what hendrickson thinks

## 2011-11-01 NOTE — Telephone Encounter (Signed)
Suggested CT, pt cannot do at this time; under tremendous stress at home--leaving out of town very soon to help daughter--will be gone several months. (Will prob have to get new cardiologist, pcp, etc while there she says). She told Dr. Dorris Fetch of s/s, per pt, he said since Dr. Mariah Milling is cardiologist to call here and we had also suggested she talk with PCP. PCP gave her Lidoderm patches, "not helping," and takes Vicodin for known chronic back pain--this does not help the chest pain. Also tried Flexeril with no relief. This pain is in right sternum radiating to right breast and under right arm, pt reports new pain now also on left side of sternum and left breast. R>L. Please advise what to tell her.

## 2011-11-04 ENCOUNTER — Emergency Department: Payer: Self-pay | Admitting: Emergency Medicine

## 2011-11-04 LAB — CBC
HCT: 37.4 % (ref 35.0–47.0)
HGB: 12 g/dL (ref 12.0–16.0)
MCV: 82 fL (ref 80–100)
Platelet: 318 10*3/uL (ref 150–440)
RBC: 4.53 10*6/uL (ref 3.80–5.20)
RDW: 17.4 % — ABNORMAL HIGH (ref 11.5–14.5)
WBC: 5.6 10*3/uL (ref 3.6–11.0)

## 2011-11-04 LAB — BASIC METABOLIC PANEL
BUN: 19 mg/dL — ABNORMAL HIGH (ref 7–18)
Calcium, Total: 9.4 mg/dL (ref 8.5–10.1)
Chloride: 103 mmol/L (ref 98–107)
Creatinine: 0.99 mg/dL (ref 0.60–1.30)
EGFR (African American): >60
EGFR (Non-African Amer.): >60
Glucose: 200 mg/dL — ABNORMAL HIGH (ref 65–99)
Sodium: 139 mmol/L (ref 136–145)

## 2011-11-04 LAB — TROPONIN I: Troponin-I: <0.02 ng/mL

## 2011-11-04 NOTE — Telephone Encounter (Signed)
Pt aware.

## 2011-11-04 NOTE — Telephone Encounter (Signed)
After discussing with Dr. Mariah Milling advised we do CT scan if does not feel cardiac, if cardiac we can try to add on in clinic, order another cath, she denies doing this. Pt says it definitely does not feel like angina, feels musculoskeletal. She knows we do not call in pain meds; I suggested NTG, she does not want to try for this pain. Told pt the other option is go to ER, they will do complete workup, labs, CT scan and [place] will be consulted if cardiac issue. Pt is going to talk to her husband and will make decision.

## 2011-11-04 NOTE — Telephone Encounter (Signed)
ER for CT scan if no relief. Less likely underlying cad Pain meds should come from PMD or CT surgery Can off NTG for pain

## 2011-11-04 NOTE — Telephone Encounter (Signed)
Pt calling back again this AM, has not heard anything, says she is in agonizing pain, I told pt to go to ER if pain is that severe, otherwise I will discuss with Dr. Mariah Milling msg below this AM.

## 2012-01-23 ENCOUNTER — Telehealth: Payer: Self-pay | Admitting: Cardiovascular Disease

## 2012-01-23 NOTE — Telephone Encounter (Signed)
Pt calling is out of town with her daughter in [place] and states her BP 175/114, 182/116, 174/123, pulse 77 and 82. Pt has HA. Took meds mid day took another ASA.

## 2012-01-23 NOTE — Telephone Encounter (Signed)
PT  CALLED WITH ELEVATED B/P TODAY  WITH SLIGHT HEADACHE   ALSO HAD EPISODE OF CHEST PAIN  NOT QUITE  SURE IF SAME PAIN PRIOR TO CABG  PAIN FREE AT THIS TIME AND B/P  DROPPING AS WELL PT CURRENTLY TAKING  CARVEDILOL  3.125  MG BID   PT AWARE TO GO TO ER IF HAS ANOTHER EPISODE  OF CHEST PAIN   WILL FORWARD TO DR Mariah Milling FOR REVIEW .Zack Seal

## 2012-01-27 NOTE — Telephone Encounter (Signed)
Can recall to followup on her blood pressures. If they continue to run high, she could double her coronary disease 6.25 mg twice a day. She may need additional medications as well Would watch the salt as this would cause her blood pressure to climb. If she has fluid retention, she might need a diuretic. Again watch the salt and fluid intake.

## 2012-01-27 NOTE — Telephone Encounter (Signed)
LMTCB ./CY 

## 2012-01-28 NOTE — Telephone Encounter (Signed)
Fu call °Patient returning your call °

## 2012-01-28 NOTE — Telephone Encounter (Signed)
Patient called back states she already takes coreg 12.5 mg twice daily.States also wants to know if Dr.Gollan will refill valium 10 mg.Will forward to Dr.Gollan for advice.

## 2012-01-28 NOTE — Telephone Encounter (Signed)
LMTCB ./CY 

## 2012-01-28 NOTE — Telephone Encounter (Signed)
Unfortunately I cannot refill Valium, other benzos or pain pills. This needs to come from primary care.

## 2012-01-29 NOTE — Telephone Encounter (Signed)
I spoke with the patient and made her aware that Dr. Mariah Milling cannot refill valium. I also made her aware he made no further mention about her coreg. I inquired what her BP readings have been this week, but she states that she has not been able to track these this week. I encouraged her to try and track her BP's consistently for about 4-5 days and let us know what they are running. If they are still consistently high, Dr. Mariah Milling may want to further adjust her meds. She voices understanding and is agreeable.

## 2012-02-17 DIAGNOSIS — Z951 Presence of aortocoronary bypass graft: Secondary | ICD-10-CM | POA: Insufficient documentation

## 2012-05-21 DIAGNOSIS — I739 Peripheral vascular disease, unspecified: Secondary | ICD-10-CM | POA: Insufficient documentation

## 2012-07-08 ENCOUNTER — Observation Stay: Payer: Self-pay | Admitting: Internal Medicine

## 2012-07-08 ENCOUNTER — Telehealth: Payer: Self-pay

## 2012-07-08 LAB — BASIC METABOLIC PANEL
Calcium, Total: 10.2 mg/dL — ABNORMAL HIGH (ref 8.5–10.1)
Chloride: 101 mmol/L (ref 98–107)
Co2: 24 mmol/L (ref 21–32)
EGFR (Non-African Amer.): >60
Glucose: 156 mg/dL — ABNORMAL HIGH (ref 65–99)
Osmolality: 277 (ref 275–301)
Potassium: 3.9 mmol/L (ref 3.5–5.1)

## 2012-07-08 LAB — CBC
HCT: 42.9 % (ref 35.0–47.0)
HGB: 13.6 g/dL (ref 12.0–16.0)
MCH: 28.3 pg (ref 26.0–34.0)
MCHC: 31.8 g/dL — ABNORMAL LOW (ref 32.0–36.0)
MCV: 89 fL (ref 80–100)
Platelet: 284 10*3/uL (ref 150–440)
RBC: 4.81 10*6/uL (ref 3.80–5.20)
RDW: 14.5 % (ref 11.5–14.5)

## 2012-07-08 LAB — TROPONIN I: Troponin-I: <0.02 ng/mL

## 2012-07-08 LAB — CK TOTAL AND CKMB (NOT AT ARMC)
CK, Total: 46 U/L (ref 21–215)
CK-MB: <0.5 ng/mL — ABNORMAL LOW (ref 0.5–3.6)

## 2012-07-08 NOTE — Telephone Encounter (Signed)
Pt called to say she has been having intermittent chest pain, pressure on left side of chest that radiates to left arm and between shoulder blades. This started this morning and is coming in "waves".  She says these symptoms are similar to those she had prior to CABG in October 2012.  She wonders if she needs to be seen. I explained Dr. Mariah Milling does not have any appts available but even if he did we may not want to waste time bringing her to office when she may be having cardiac event. i advised her to go to [facility] ER now.  She says the last 3 x she went to ER, EKGs come back "normal" but she ends up having cath/CABG. I encouraged her to explain this to ER when she goes, to let them know EKGs do not always show abnormalities.  She verb. Understanding and wants to wait for husband to take her. He can dio this in 1 hour. I advised ok but she should call 911 should symptoms get worse/go unrelieved between now and then. Understanding verb.

## 2012-07-09 DIAGNOSIS — R079 Chest pain, unspecified: Secondary | ICD-10-CM

## 2012-07-09 LAB — CBC WITH DIFFERENTIAL/PLATELET
Basophil #: 0 10*3/uL (ref 0.0–0.1)
Basophil %: 1 %
Eosinophil #: 0.2 10*3/uL (ref 0.0–0.7)
HCT: 39.5 % (ref 35.0–47.0)
HGB: 13 g/dL (ref 12.0–16.0)
MCH: 29.4 pg (ref 26.0–34.0)
MCHC: 32.9 g/dL (ref 32.0–36.0)
Monocyte #: 0.3 x10 3/mm (ref 0.2–0.9)
Monocyte %: 6.8 %
Neutrophil #: 1.7 10*3/uL (ref 1.4–6.5)
Neutrophil %: 40.1 %
RDW: 14.3 % (ref 11.5–14.5)

## 2012-07-09 LAB — LIPID PANEL
Cholesterol: 200 mg/dL (ref 0–200)
Triglycerides: 679 mg/dL — ABNORMAL HIGH (ref 0–200)

## 2012-07-09 LAB — BASIC METABOLIC PANEL
Anion Gap: 7 (ref 7–16)
BUN: 15 mg/dL (ref 7–18)
Chloride: 107 mmol/L (ref 98–107)
Co2: 25 mmol/L (ref 21–32)
EGFR (Non-African Amer.): >60
Glucose: 154 mg/dL — ABNORMAL HIGH (ref 65–99)
Osmolality: 281 (ref 275–301)

## 2012-07-09 LAB — HEMOGLOBIN A1C: Hemoglobin A1C: 8.9 % — ABNORMAL HIGH (ref 4.2–6.3)

## 2012-07-09 LAB — MAGNESIUM: Magnesium: 1.8 mg/dL

## 2012-07-09 LAB — CK TOTAL AND CKMB (NOT AT ARMC): CK-MB: <0.5 ng/mL — ABNORMAL LOW (ref 0.5–3.6)

## 2012-07-15 ENCOUNTER — Encounter: Payer: Self-pay | Admitting: *Deleted

## 2012-07-15 DIAGNOSIS — I25118 Atherosclerotic heart disease of native coronary artery with other forms of angina pectoris: Secondary | ICD-10-CM | POA: Insufficient documentation

## 2012-07-15 DIAGNOSIS — G8929 Other chronic pain: Secondary | ICD-10-CM | POA: Insufficient documentation

## 2012-07-15 DIAGNOSIS — M549 Dorsalgia, unspecified: Secondary | ICD-10-CM | POA: Insufficient documentation

## 2012-07-15 DIAGNOSIS — E781 Pure hyperglyceridemia: Secondary | ICD-10-CM | POA: Insufficient documentation

## 2012-07-15 DIAGNOSIS — D680 Von Willebrand's disease: Secondary | ICD-10-CM | POA: Insufficient documentation

## 2012-07-15 DIAGNOSIS — E119 Type 2 diabetes mellitus without complications: Secondary | ICD-10-CM | POA: Insufficient documentation

## 2012-07-15 DIAGNOSIS — K219 Gastro-esophageal reflux disease without esophagitis: Secondary | ICD-10-CM | POA: Insufficient documentation

## 2012-07-15 DIAGNOSIS — K439 Ventral hernia without obstruction or gangrene: Secondary | ICD-10-CM | POA: Insufficient documentation

## 2012-07-15 DIAGNOSIS — F319 Bipolar disorder, unspecified: Secondary | ICD-10-CM | POA: Insufficient documentation

## 2012-07-20 ENCOUNTER — Ambulatory Visit (INDEPENDENT_AMBULATORY_CARE_PROVIDER_SITE_OTHER): Admitting: Cardiovascular Disease

## 2012-07-20 ENCOUNTER — Encounter: Payer: Self-pay | Admitting: Cardiovascular Disease

## 2012-07-20 VITALS — BP 160/98 | HR 91 | Ht 63.0 in | Wt 168.2 lb

## 2012-07-20 DIAGNOSIS — I10 Essential (primary) hypertension: Secondary | ICD-10-CM

## 2012-07-20 DIAGNOSIS — I2510 Atherosclerotic heart disease of native coronary artery without angina pectoris: Secondary | ICD-10-CM

## 2012-07-20 DIAGNOSIS — R079 Chest pain, unspecified: Secondary | ICD-10-CM

## 2012-07-20 DIAGNOSIS — R0602 Shortness of breath: Secondary | ICD-10-CM

## 2012-07-20 DIAGNOSIS — E785 Hyperlipidemia, unspecified: Secondary | ICD-10-CM

## 2012-07-20 MED ORDER — LISINOPRIL 20 MG PO TABS: 20.0000 mg | ORAL_TABLET | Freq: Every day | ORAL | Status: DC

## 2012-07-20 NOTE — Assessment & Plan Note (Signed)
Recent lipid profile at [facility] showed a triglyceride of greater than 600. She was already on fenofibrate. Atorvastatin 20 mg once daily was added. I recommend repeating lipid profile in few months. Triglycerides are still elevated, consider adding high concentration fish oil.

## 2012-07-20 NOTE — Assessment & Plan Note (Signed)
Her chest pain was overall atypical. She has not had any further discomfort since hospital discharge. Nuclear stress test showed no evidence of ischemia. Ejection fraction was reported to be 44%. Continue treatment with carvedilol and lisinopril.

## 2012-07-20 NOTE — Progress Notes (Signed)
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  She underwent bypass surgery  on October 15 by Dr. Dorris Fetch. She had bypass graft x3 with a LIMA to the LAD, vein graft to the RCA, vein graft to the diagonal.  She had an overall slow recovery and had some issues with memory loss and anxiety. She presented recently on September 11 to [facility] with right-sided chest pain. She ruled out for myocardial infarction by cardiac enzymes. She underwent a nuclear stress test which showed no evidence of ischemia with an ejection fraction of 44%. She was noted to be hypertensive with systolic blood pressure of 180. Her labs also showed severe hypertriglyceridemia with a triglyceride level of 679. Renal function was normal. Atorvastatin was added to TriCor. Since hospital discharge, the patient reports no further chest pain. She denies any dyspnea.   No Known Allergies   Current Outpatient Prescriptions on File Prior to Visit  Medication Sig Dispense Refill  . albuterol-ipratropium (COMBIVENT) 18-103 MCG/ACT inhaler Inhale 2 puffs into the lungs every 4 (four) hours as needed.      Marland Kitchen aspirin 325 MG tablet Take 325 mg by mouth daily.        Marland Kitchen atorvastatin (LIPITOR) 20 MG tablet Take 20 mg by mouth daily.      . Calcium Carbonate-Vitamin D (OS-CAL 500 + D PO) Take by mouth daily.      . carvedilol (COREG) 12.5 MG tablet Take 12.5 mg by mouth 2 (two) times daily with a meal.      . diazepam (VALIUM) 10 MG tablet Take 10 mg by mouth  2 (two) times daily as needed.      . DULoxetine (CYMBALTA) 60 MG capsule Take 120 mg by mouth daily.        Marland Kitchen estradiol (ESTRACE) 0.5 MG tablet Place 0.5 mg vaginally daily.       . fenofibrate micronized (LOFIBRA) 134 MG capsule Take 134 mg by mouth daily before breakfast.      . ferrous sulfate 325 (65 FE) MG tablet Take 325 mg by mouth daily.      Marland Kitchen FLUoxetine (PROZAC) 10 MG tablet Take 20 mg by mouth daily.       . Hydrocodone-Acetaminophen 10-325 MG/15ML SOLN Take by mouth as needed.      . Insulin Isophane & Regular (HUMULIN 70/30 PEN [place]) Inject 27 Units into the skin 2 (two) times daily.      . iron polysaccharides (NIFEREX) 150 MG capsule Take 150 mg by mouth 1 day or 1 dose.        . lamoTRIgine (LAMICTAL) 200 MG tablet Take 200 mg by mouth daily.        Marland Kitchen levothyroxine (SYNTHROID, LEVOTHROID) 25 MCG tablet Take 25 mcg by mouth daily.      . metFORMIN (GLUCOPHAGE) 500 MG tablet Take 1,000 mg by mouth 2 (two) times daily with a meal.      . omeprazole (PRILOSEC) 20 MG capsule Take 20 mg by mouth  2 (two) times daily.      . QUEtiapine (SEROQUEL XR) 300 MG 24 hr tablet Take 300 mg by mouth at bedtime. 2 tablets every hs      . lisinopril (PRINIVIL,ZESTRIL) 20 MG tablet Take 1 tablet (20 mg total) by mouth daily.  30 tablet  6     Past Medical History  Diagnosis Date  . GERD (gastroesophageal reflux disease)   . Hyperlipidemia   . Hypertension   . Enlarged liver   . Diabetes mellitus   . Low back pain   . Depression with anxiety   . CAD (coronary artery disease)      Past Surgical History  Procedure Date  . Cardiac catheterization     CAD,PCI of LAD, Cypher stent 2.5-28mm  . Cardiac catheterization     PCI of mid-LAD in stent restenosis with a drug-eluting stent  . Breast implant removal   . Coronary artery bypass graft  08-12-2011    CABG x 3     Family History  Problem Relation Age of Onset  . Hypertension Other     family hx  . Hyperlipidemia Other     family  hx  . Diabetes Other     family hx  . Aneurysm Father     abdominal  . Diabetes Father   . Aneurysm Brother     abdominal  . Diabetes Mother      History   Social History  . Marital Status: Married    Spouse Name: N/A    Number of Children: N/A  . Years of Education: N/A   Occupational History  . Not on file.   Social History Main Topics  . Smoking status: Former Smoker -- 16 years    Types: Cigarettes    Quit date: 06/20/2010  . Smokeless tobacco: Never Used  . Alcohol Use: No  . Drug Use: No  . Sexually Active: Not on file   Other Topics Concern  . Not on file   Social History Narrative  . No narrative on file     PHYSICAL EXAM   BP 160/98  Pulse 91  Ht 5\' 3"  (1.6 m)  Wt 168 lb 4 oz (76.318 kg)  BMI 29.80 kg/m2  Constitutional: She is oriented to person, place, and time. She appears well-developed and well-nourished. No distress.  HENT: No nasal discharge.  Head: Normocephalic and atraumatic.  Eyes: Pupils are equal and round. Right eye exhibits no discharge. Left eye exhibits no discharge.  Neck: Normal range of motion. Neck supple. No JVD present. No thyromegaly present.  Cardiovascular: Normal rate, regular rhythm, normal heart sounds. Exam reveals no gallop and no friction rub. No murmur heard.  Pulmonary/Chest: Effort normal and breath sounds normal. No stridor. No respiratory distress. She has no wheezes. She has no rales. She exhibits no tenderness.  Abdominal: Soft. Bowel sounds are normal. She exhibits no distension. There is no tenderness. There is no rebound and no guarding.  Musculoskeletal: Normal range of motion. She exhibits no edema and no tenderness.  Neurological: She is alert and oriented to person, place, and time. Coordination normal.  Skin: Skin is warm and dry. No rash noted. She is not diaphoretic. No erythema. No pallor.  Psychiatric: She has a normal mood and affect. Her behavior is normal. Judgment and thought content normal.     EKG: Sinus  Rhythm  Nonspecific T wave changes  WITHIN NORMAL LIMITS   ASSESSMENT AND PLAN

## 2012-07-20 NOTE — Patient Instructions (Addendum)
Start Lisinopril 20 mg once daily.  You need labs to be done in 1 week.   Follow up with Dr. Mariah Milling in 3 months.

## 2012-07-20 NOTE — Assessment & Plan Note (Signed)
Blood pressure is elevated and was also very high during her hospitalization. She used to be on Micardis in the past. I will add lisinopril 20 mg once daily. Recent renal function was normal. I will request basic metabolic profile to be done in one week

## 2012-08-27 DIAGNOSIS — Z7689 Persons encountering health services in other specified circumstances: Secondary | ICD-10-CM | POA: Insufficient documentation

## 2012-08-27 DIAGNOSIS — M810 Age-related osteoporosis without current pathological fracture: Secondary | ICD-10-CM | POA: Insufficient documentation

## 2012-10-19 ENCOUNTER — Ambulatory Visit: Admitting: Cardiovascular Disease

## 2012-11-06 ENCOUNTER — Ambulatory Visit (INDEPENDENT_AMBULATORY_CARE_PROVIDER_SITE_OTHER): Admitting: Cardiovascular Disease

## 2012-11-06 ENCOUNTER — Encounter: Payer: Self-pay | Admitting: Cardiovascular Disease

## 2012-11-06 VITALS — BP 120/80 | HR 87 | Ht 63.0 in | Wt 167.2 lb

## 2012-11-06 DIAGNOSIS — R0789 Other chest pain: Secondary | ICD-10-CM

## 2012-11-06 DIAGNOSIS — R079 Chest pain, unspecified: Secondary | ICD-10-CM

## 2012-11-06 DIAGNOSIS — E785 Hyperlipidemia, unspecified: Secondary | ICD-10-CM

## 2012-11-06 DIAGNOSIS — I10 Essential (primary) hypertension: Secondary | ICD-10-CM

## 2012-11-06 DIAGNOSIS — I2510 Atherosclerotic heart disease of native coronary artery without angina pectoris: Secondary | ICD-10-CM

## 2012-11-06 DIAGNOSIS — R0602 Shortness of breath: Secondary | ICD-10-CM

## 2012-11-06 DIAGNOSIS — F17200 Nicotine dependence, unspecified, uncomplicated: Secondary | ICD-10-CM

## 2012-11-06 MED ORDER — NITROGLYCERIN 0.4 MG SL SUBL: 0.4000 mg | SUBLINGUAL_TABLET | SUBLINGUAL | Status: DC | PRN

## 2012-11-06 NOTE — Assessment & Plan Note (Signed)
Symptoms concerning for angina. As mentioned, we will watch her closely and have suggested she call us with any further symptoms, particularly shortness of breath or chest pain with exertion. She'll continue to take nitroglycerin as needed. She will call he office to schedule cardiac catheterization for any further symptoms.

## 2012-11-06 NOTE — Assessment & Plan Note (Signed)
Reason severe episodes of chest pain. We have discussed various treatment options with her. She is normally cardiac catheterization at this time. She did recent stress test several months ago. We have suggested that she contact our office for any further episodes particularly if she is taking nitroglycerin. She will discuss better management of her anxiety with her primary care physician.

## 2012-11-06 NOTE — Patient Instructions (Addendum)
You are doing well. No medication changes were made.  If you have more chest pain or taking more NTG   Please call us if you have new issues that need to be addressed before your next appt.  Your physician wants you to follow-up in: 6 months.  You will receive a reminder letter in the mail two months in advance. If you don't receive a letter, please call our office to schedule the follow-up appointment.

## 2012-11-06 NOTE — Progress Notes (Signed)
Patient ID: Jennifer [name], female    DOB: [birth year], 57 y.o.   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  She was referred to [facility] for bypass surgery which was performed on October 15 by Dr. Dorris Fetch. She had bypass graft x3 with a LIMA to the LAD, vein graft to the RCA, vein graft to the diagonal.  She reports that she has had recent episodes of chest pain. Several weeks ago, she had severe chest pain on the left and took nitroglycerin with relief of her pain. She reports it was the worst pain she has ever had. She did not call the office at that time. She has also had episodes of squeezing around her side, last episode was this morning. This resolved without intervention. She is concerned that some of her symptoms are secondary to anxiety though she is concerned about the severe chest pain episodes.   on  July 08 2012 she presented to the hospital withwith  right-sided chest pain. She ruled out for myocardial infarction by cardiac enzymes. She underwent a nuclear stress test which showed no evidence of ischemia with an ejection fraction of 44%. She was noted to be hypertensive with systolic blood pressure of 180. Her labs also showed severe hypertriglyceridemia with a triglyceride level of 679. Renal function was normal. Atorvastatin was added to TriCor.  Symptoms have been better in the past on benzodiazepines. She continues to take Prozac. She was previously on Lamictal  and Cymbalta but is not on these 2 medications anymore. She sees Dr. Sheppard Penton, also sees Dr. Wilkie Aye in [place]. She does travel up to [place] to help family .    Cholesterol is well controlled to level CX, LDL 17, hemoglobin A1c 8.0  EKG today shows normal sinus rhythm with rate 87  beats per minute with no significant ST or T wave changes   Outpatient Encounter Prescriptions as of 11/06/2012  Medication Sig Dispense Refill  . aspirin 325 MG tablet Take 325 mg by mouth daily.        Marland Kitchen atorvastatin (LIPITOR) 20 MG tablet Take 20 mg by mouth daily.      . carvedilol (COREG) 12.5 MG tablet Take 12.5 mg by mouth 2 (two) times daily with a meal.      . estradiol (ESTRACE) 0.5 MG tablet Place 0.5 mg vaginally daily.       . fenofibrate micronized (LOFIBRA) 134 MG capsule Take 134 mg by mouth daily before breakfast.      . FLUoxetine (PROZAC) 10 MG tablet Take 20 mg by mouth daily.       . Hydrocodone-Acetaminophen 10-325 MG/15ML SOLN Take by mouth as needed.      . Insulin Isophane & Regular (HUMULIN 70/30 PEN [place]) Inject 27 Units into the skin 2 (two) times daily.      Marland Kitchen levothyroxine (SYNTHROID, LEVOTHROID) 25 MCG tablet Take 25 mcg by mouth daily.      Marland Kitchen lisinopril (PRINIVIL,ZESTRIL) 20 MG tablet Take 1 tablet (  20 mg total) by mouth daily.  30 tablet  6  . metFORMIN (GLUCOPHAGE) 500 MG tablet Take 1,000 mg by mouth 2 (two) times daily with a meal.      . omeprazole (PRILOSEC) 20 MG capsule Take 20 mg by mouth 2 (two) times daily.      . QUEtiapine (SEROQUEL XR) 300 MG 24 hr tablet Take 300 mg by mouth at bedtime. 2 tablets every hs      . nitroGLYCERIN (NITROSTAT) 0.4 MG SL tablet Place 1 tablet (0.4 mg total) under the tongue every 5 (five) minutes as needed for chest pain.  25 tablet  6    Review of Systems  HENT: Negative.   Eyes: Negative.   Respiratory: Negative.   Cardiovascular: Positive for chest pain.  Gastrointestinal: Negative.   Musculoskeletal: Positive for back pain.  Skin:  Negative.   Hematological: Negative.   Psychiatric/Behavioral: The patient is nervous/anxious.   All other systems reviewed and are negative.   BP 120/80  Pulse 87  Ht 5\' 3"  (1.6 m)  Wt 167 lb 4 oz (75.864 kg)  BMI 29.63 kg/m2  Physical Exam  Nursing note and vitals reviewed. Constitutional: She is oriented to person, place, and time. She appears well-developed and well-nourished.  HENT:  Head: Normocephalic.  Nose: Nose normal.  Mouth/Throat: Oropharynx is clear and moist.  Eyes: Conjunctivae normal are normal. Pupils are equal, round, and reactive to light.  Neck: Normal range of motion. Neck supple. No JVD present.  Cardiovascular: Normal rate, regular rhythm, S1 normal, S2 normal, normal heart sounds and intact distal pulses.  Exam reveals no gallop and no friction rub.   No murmur heard.      Well-healed mediastinal scar  Pulmonary/Chest: Effort normal and breath sounds normal. No respiratory distress. She has no wheezes. She has no rales. She exhibits no tenderness.  Abdominal: Soft. Bowel sounds are normal. She exhibits no distension. There is no tenderness.  Musculoskeletal: Normal range of motion. She exhibits no edema and no tenderness.  Lymphadenopathy:    She has no cervical adenopathy.  Neurological: She is alert and oriented to person, place, and time. Coordination normal.  Skin: Skin is warm and dry. No rash noted. No erythema.  Psychiatric: She has a normal mood and affect. Her behavior is normal. Judgment and thought content normal.         Assessment and Plan

## 2012-11-06 NOTE — Assessment & Plan Note (Signed)
She reports that she stop smoking 2 years ago.

## 2012-11-06 NOTE — Assessment & Plan Note (Signed)
Blood pressure is well controlled on today's visit. No changes made to the medications. 

## 2012-11-06 NOTE — Assessment & Plan Note (Signed)
Cholesterol is at goal on the current lipid regimen. No changes to the medications were made.  

## 2013-02-17 ENCOUNTER — Encounter: Payer: Self-pay | Admitting: Cardiovascular Disease

## 2013-02-17 ENCOUNTER — Ambulatory Visit (INDEPENDENT_AMBULATORY_CARE_PROVIDER_SITE_OTHER): Admitting: Cardiovascular Disease

## 2013-02-17 VITALS — BP 146/83 | HR 88 | Ht 63.0 in | Wt 166.5 lb

## 2013-02-17 DIAGNOSIS — F341 Dysthymic disorder: Secondary | ICD-10-CM

## 2013-02-17 DIAGNOSIS — F329 Major depressive disorder, single episode, unspecified: Secondary | ICD-10-CM | POA: Insufficient documentation

## 2013-02-17 DIAGNOSIS — F17200 Nicotine dependence, unspecified, uncomplicated: Secondary | ICD-10-CM

## 2013-02-17 DIAGNOSIS — R0602 Shortness of breath: Secondary | ICD-10-CM

## 2013-02-17 DIAGNOSIS — E785 Hyperlipidemia, unspecified: Secondary | ICD-10-CM

## 2013-02-17 DIAGNOSIS — I2510 Atherosclerotic heart disease of native coronary artery without angina pectoris: Secondary | ICD-10-CM

## 2013-02-17 DIAGNOSIS — F419 Anxiety disorder, unspecified: Secondary | ICD-10-CM

## 2013-02-17 DIAGNOSIS — R079 Chest pain, unspecified: Secondary | ICD-10-CM

## 2013-02-17 DIAGNOSIS — R000 Tachycardia, unspecified: Secondary | ICD-10-CM

## 2013-02-17 DIAGNOSIS — I10 Essential (primary) hypertension: Secondary | ICD-10-CM

## 2013-02-17 NOTE — Assessment & Plan Note (Signed)
Blood pressure is well controlled on today's visit. No changes made to the medications. 

## 2013-02-17 NOTE — Assessment & Plan Note (Signed)
We'll hold the statin for now.

## 2013-02-17 NOTE — Progress Notes (Signed)
Patient ID: Jennifer [name], female    DOB: 23-Apr-1956, 57 y.o.   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  She was referred to [facility] for bypass surgery which was performed on October 15 by Dr. Dorris Fetch. She had bypass graft x3 with a LIMA to the LAD, vein graft to the RCA, vein graft to the diagonal.  She reports that she has had a very difficult time. She has commenced anxiety, stress at home, confusion at times. Treated by a psychiatrist in [place], started on Valium. She was taking this several times a day which did not seem to help her symptoms. She is tired all time, sleeping a good number of hours per day.  Her biggest concern is that she continues to have sharp chest pain on the left side. She grips her upper left breast, pectoral region showing where the symptoms are. Uncertain if this is musculoskeletal or deeper. Some pain with grabbing her pectoral area, possibly above the ribs though unclear. She feels her symptoms are from anxiety and wants to know if we know of any way to improve her anxiety symptoms and depression.  July 08 2012 she presented to the hospital withwith  right-sided chest pain. She ruled out for myocardial infarction by cardiac enzymes. She underwent a nuclear stress test which showed no evidence of ischemia with an ejection fraction of 44%. She was noted to be hypertensive with systolic blood pressure of 180. Her labs also showed severe  hypertriglyceridemia with a triglyceride level of 679. Renal function was normal. Atorvastatin was added to [employer].  Taking benzodiazepines,  Prozac.  previously on Lamictal and Cymbalta,  She sees Dr. Sheppard Penton, also sees Dr. Wilkie Aye in [place]. She does travel up to [place] to help family .    Cholesterol is well controlled to level hemoglobin A1c 8.0  EKG today shows normal sinus rhythm with rate 88 beats per minute with no significant ST or T wave changes   Outpatient Encounter Prescriptions as of 02/17/2013  Medication Sig Dispense Refill  . aspirin 325 MG tablet Take 325 mg by mouth daily.        Marland Kitchen atorvastatin (LIPITOR) 20 MG tablet Take 20 mg by mouth daily.      . carvedilol (COREG) 12.5 MG tablet Take 12.5 mg by mouth 2 (two) times daily with a meal.      . fenofibrate micronized (LOFIBRA) 134 MG capsule Take 134 mg by mouth daily before breakfast.      . FLUoxetine (PROZAC) 10 MG tablet Take 20 mg by mouth daily.       . Hydrocodone-Acetaminophen 10-325 MG/15ML SOLN Take by mouth as needed.      . Insulin Isophane & Regular (HUMULIN 70/30 PEN [place]) Inject 27 Units into the skin 2 (two) times daily.      Marland Kitchen levothyroxine (SYNTHROID, LEVOTHROID) 25 MCG tablet Take 25 mcg by mouth daily.      Marland Kitchen lisinopril (PRINIVIL,ZESTRIL) 20 MG tablet Take 1 tablet (20  mg total) by mouth daily.  30 tablet  6  . metFORMIN (GLUCOPHAGE) 500 MG tablet Take 1,000 mg by mouth 2 (two) times daily with a meal.      . nitroGLYCERIN (NITROSTAT) 0.4 MG SL tablet Place 1 tablet (0.4 mg total) under the tongue every 5 (five) minutes as needed for chest pain.  25 tablet  6  . omeprazole (PRILOSEC) 20 MG capsule Take 20 mg by mouth 2 (two) times daily.      . QUEtiapine (SEROQUEL XR) 300 MG 24 hr tablet Take 300 mg by mouth at bedtime. 2 tablets every hs       Review of Systems  HENT: Negative.   Eyes: Negative.   Respiratory: Negative.   Cardiovascular: Positive for chest pain.  Gastrointestinal: Negative.    Musculoskeletal: Positive for back pain.  Skin: Negative.   Psychiatric/Behavioral: Positive for dysphoric mood. The patient is nervous/anxious.   All other systems reviewed and are negative.   BP 146/83  Pulse 88  Ht 5\' 3"  (1.6 m)  Wt 166 lb 8 oz (75.524 kg)  BMI 29.5 kg/m2  Physical Exam  Nursing note and vitals reviewed. Constitutional: She is oriented to person, place, and time. She appears well-developed and well-nourished.  HENT:  Head: Normocephalic.  Nose: Nose normal.  Mouth/Throat: Oropharynx is clear and moist.  Eyes: Conjunctivae are normal. Pupils are equal, round, and reactive to light.  Neck: Normal range of motion. Neck supple. No JVD present.  Cardiovascular: Normal rate, regular rhythm, S1 normal, S2 normal, normal heart sounds and intact distal pulses.  Exam reveals no gallop and no friction rub.   No murmur heard. Well-healed mediastinal scar  Pulmonary/Chest: Effort normal and breath sounds normal. No respiratory distress. She has no wheezes. She has no rales. She exhibits no tenderness.  Abdominal: Soft. Bowel sounds are normal. She exhibits no distension. There is no tenderness.  Musculoskeletal: Normal range of motion. She exhibits no edema and no tenderness.  Lymphadenopathy:    She has no cervical adenopathy.  Neurological: She is alert and oriented to person, place, and time. Coordination normal.  Skin: Skin is warm and dry. No rash noted. No erythema.  Psychiatric: She has a normal mood and affect. Her behavior is normal. Judgment and thought content normal.    Assessment and Plan

## 2013-02-17 NOTE — Patient Instructions (Addendum)
Hold the atorvastatin for a few weeks to see if chest pains get better  Start ranexa 500 mg twice a day for one week Then increase to 1000 mg twice a day See if chest pain symptoms get better Call the office if your chest pain persists  Please call us if you have new issues that need to be addressed before your next appt.  Your physician wants you to follow-up in: 3 months.

## 2013-02-17 NOTE — Assessment & Plan Note (Signed)
She continues to have episodes of chest pain. Atypical in nature as they come on at rest, possible musculoskeletal though unsure given her long history of severe CAD and repeated stents. We have suggested she hold her cholesterol medication to rule out myalgias from a statin. We'll also start Ranexa twice a day.

## 2013-02-17 NOTE — Assessment & Plan Note (Signed)
She reports that she stopped smoking several years ago

## 2013-02-17 NOTE — Assessment & Plan Note (Signed)
Significant problem with anxiety and depression. She has tried numerous medications as listed. No real help with long-acting benzodiazepines. We have suggested she continue to see psychiatry, possibly somebody in town.

## 2013-02-17 NOTE — Assessment & Plan Note (Addendum)
If she does not have improvement of her symptoms by holding the cholesterol medication and starting Ranexa, she may need a cardiac catheterization.

## 2013-02-23 ENCOUNTER — Telehealth: Payer: Self-pay | Admitting: *Deleted

## 2013-02-23 ENCOUNTER — Other Ambulatory Visit: Payer: Self-pay

## 2013-02-23 MED ORDER — ISOSORBIDE MONONITRATE ER 30 MG PO TB24: 30.0000 mg | ORAL_TABLET | Freq: Every day | ORAL | Status: DC

## 2013-02-23 NOTE — Telephone Encounter (Signed)
Spoke with pt and she mentioned that she has been having intermittent chest pain with radiating under left arm. No BP or HR she does not able to check. She mentioned that she has taken nitro x2 today and has still had chest pain. Nitro around 10:00 am and 12:00 pm. She also mentioned being put on Ranexa for chest pain and that Dr. Mariah Milling told her that if she keeps having chest pain that Heart cath may need to be done. Pt is aware that being that she is still having acting chest pain and has had to take nitro twice already she should call 911. Pt is going to get husband to take her due to her being home alone with grandchild. Please advise.

## 2013-02-23 NOTE — Telephone Encounter (Signed)
Discussed with Dr. Mariah Milling who says to "increase ranexa to 1000 mg BID and start isosorbide 30 mg PO QD" VO Dr. Alvis Lemmings, RN Pt and husband informed Denies further CP RX sent to pharmacy

## 2013-02-23 NOTE — Telephone Encounter (Signed)
Pt says she did not go to ER. She told husband to let her "wait it out" Has not taken anymore NTG. Has had a total of 2 today Unsure if pain has eased off at all, says she hasn't "had time to tell" since she is busy with her 58 year old grandson who is crying in background  Says stress makes CP worse Described as SS CP that is intermittent Says this is the same pain described when she saw Dr. Mariah Milling 02/17/13 Taking Ranexa as prescribed (500 mg through tomm then will increase to 1000 mg), has noticed no improvement in symptoms  Mentions heart cath that was discussed at OV. I told her I would discuss with Dr. Mariah Milling and if she develops further [pain between now and then, she should call 911, especially if unrelieved by a 3rd NTG Understanding verb

## 2013-03-24 ENCOUNTER — Ambulatory Visit: Payer: Self-pay | Admitting: Pain Medicine

## 2013-04-02 ENCOUNTER — Other Ambulatory Visit: Payer: Self-pay | Admitting: Pain Medicine

## 2013-04-02 LAB — MAGNESIUM: Magnesium: 1.4 mg/dL — ABNORMAL LOW

## 2013-04-02 LAB — SEDIMENTATION RATE: Erythrocyte Sed Rate: 4 mm/hr (ref 0–30)

## 2013-04-13 ENCOUNTER — Ambulatory Visit: Payer: Self-pay | Admitting: Pain Medicine

## 2013-05-03 ENCOUNTER — Ambulatory Visit: Payer: Self-pay | Admitting: Pain Medicine

## 2013-05-11 ENCOUNTER — Ambulatory Visit: Payer: Self-pay | Admitting: Pain Medicine

## 2013-05-19 ENCOUNTER — Ambulatory Visit: Admitting: Cardiovascular Disease

## 2013-05-24 ENCOUNTER — Ambulatory Visit (INDEPENDENT_AMBULATORY_CARE_PROVIDER_SITE_OTHER): Admitting: Cardiovascular Disease

## 2013-05-24 ENCOUNTER — Encounter: Payer: Self-pay | Admitting: Cardiovascular Disease

## 2013-05-24 VITALS — BP 136/92 | HR 86 | Ht 63.0 in | Wt 162.2 lb

## 2013-05-24 DIAGNOSIS — I10 Essential (primary) hypertension: Secondary | ICD-10-CM

## 2013-05-24 DIAGNOSIS — F17200 Nicotine dependence, unspecified, uncomplicated: Secondary | ICD-10-CM

## 2013-05-24 DIAGNOSIS — R0602 Shortness of breath: Secondary | ICD-10-CM

## 2013-05-24 DIAGNOSIS — E785 Hyperlipidemia, unspecified: Secondary | ICD-10-CM

## 2013-05-24 DIAGNOSIS — E119 Type 2 diabetes mellitus without complications: Secondary | ICD-10-CM

## 2013-05-24 DIAGNOSIS — F341 Dysthymic disorder: Secondary | ICD-10-CM

## 2013-05-24 DIAGNOSIS — R079 Chest pain, unspecified: Secondary | ICD-10-CM

## 2013-05-24 DIAGNOSIS — I2510 Atherosclerotic heart disease of native coronary artery without angina pectoris: Secondary | ICD-10-CM

## 2013-05-24 DIAGNOSIS — F419 Anxiety disorder, unspecified: Secondary | ICD-10-CM

## 2013-05-24 MED ORDER — AMLODIPINE BESYLATE 10 MG PO TABS: 10.0000 mg | ORAL_TABLET | Freq: Every day | ORAL | Status: DC

## 2013-05-24 NOTE — Assessment & Plan Note (Signed)
Encouraged her to stay on her current medications. Goal LDL less than 70.

## 2013-05-24 NOTE — Patient Instructions (Addendum)
Please start amlodipine 5 mg once a day (cut the 10 mg pill in 1/2) Stop the lisinopril (because of the cough)  Restart the generic (lipitor)  We will schedule a cardiac cath for chest pain/angina  Please call us if you have new issues that need to be addressed before your next appt.  Your physician wants you to follow-up in: 1 months.

## 2013-05-24 NOTE — Assessment & Plan Note (Signed)
She continues to have anginal pain. Stress test in 2013 showed no ischemia. We have tried medical management. We'll schedule her for a cardiac catheterization at her request given her continued symptoms and lack of response to medications. In the past when she's had symptoms, she has typically needing intervention.

## 2013-05-24 NOTE — Assessment & Plan Note (Signed)
Uncertain if her anxiety or depression is playing a role. She has such severe coronary artery disease by history, likely is having angina.

## 2013-05-24 NOTE — Assessment & Plan Note (Signed)
Blood pressure is well controlled on today's visit. No changes made to the medications. 

## 2013-05-24 NOTE — Progress Notes (Signed)
Patient ID: Jennifer [name], female    DOB: 02/11/1956, 57 y.o.   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   bypass surgery at [facility] which was performed on August 12 2011 by Dr. Dorris Fetch. She had bypass graft x3 with a LIMA to the LAD, vein graft to the RCA, vein graft to the diagonal.  In the past she has had problems with Chronic anxiety, stress at home, confusion at times. Treated by a psychiatrist in [place], started on Valium.  She is tired all time, sleeping a good number of hours per day. In the past she has taken benzodiazepines,  Prozac.  previously on Lamictal and Cymbalta,  She sees Dr. Sheppard Penton, also sees Dr. Wilkie Aye in [place]. She does travel up to [place] to help family .    On previous visits, she has continued to have left-sided chest pain. We'll try to maximize her medical management including adding isosorbide and Ranexa. He presents today and reports that she continues to have left-sided chest pain described as a squeezing. She is concerned about more blockages.  July 08 2012  right-sided chest pain. She ruled out for myocardial infarction by cardiac enzymes.  nuclear stress test which showed no evidence of ischemia with an ejection fraction of 44%. She was noted to be hypertensive with systolic blood pressure of 180.   Diabetes typically poorly controlled  EKG today shows normal sinus rhythm with rate 86 beats per minute with no significant ST or T wave  changes   Outpatient Encounter Prescriptions as of 05/24/2013  Medication Sig Dispense Refill  . aspirin 325 MG tablet Take 325 mg by mouth daily.        Marland Kitchen atorvastatin (LIPITOR) 20 MG tablet Take 20 mg by mouth daily.      . carvedilol (COREG) 12.5 MG tablet Take 12.5 mg by mouth 2 (two) times daily with a meal.      . Cholecalciferol (VITAMIN D) 2000 UNITS tablet Take 2,000 Units by mouth daily.      . ergocalciferol (VITAMIN D2) 50000 UNITS capsule Take 50,000 Units by mouth. Twice weekly      . fenofibrate micronized (LOFIBRA) 134 MG capsule Take 134 mg by mouth daily before breakfast.      . FLUoxetine (PROZAC) 10 MG tablet Take 20 mg by mouth daily.       . Hydrocodone-Acetaminophen 10-325 MG/15ML SOLN Take by mouth as needed.      . Insulin Isophane & Regular (HUMULIN 70/30 PEN [place]) Inject 27 Units into the skin 2 (two) times daily.      . isosorbide mononitrate (IMDUR) 30 MG 24 hr tablet Take 1 tablet (30 mg total) by mouth daily.  90 tablet  3  . levothyroxine (SYNTHROID, LEVOTHROID) 25 MCG tablet Take 25 mcg by mouth daily.      Marland Kitchen lisinopril (PRINIVIL,ZESTRIL) 20 MG tablet Take 1 tablet (20 mg total) by mouth daily.  30 tablet  6  . Magnesium 400 MG CAPS Take  400 mg by mouth daily.      . metFORMIN (GLUCOPHAGE) 500 MG tablet Take 1,000 mg by mouth 2 (two) times daily with a meal.      . nitroGLYCERIN (NITROSTAT) 0.4 MG SL tablet Place 1 tablet (0.4 mg total) under the tongue every 5 (five) minutes as needed for chest pain.  25 tablet  6  . omeprazole (PRILOSEC) 20 MG capsule Take 20 mg by mouth 2 (two) times daily.      . QUEtiapine (SEROQUEL XR) 300 MG 24 hr tablet Take 300 mg by mouth at bedtime. 2 tablets every hs      . ranolazine (RANEXA) 1000 MG SR tablet Take 1 tablet (1,000 mg total) by mouth 2 (two) times daily.       No facility-administered encounter medications on file as of 05/24/2013.    Review of Systems  HENT: Negative.   Eyes: Negative.   Respiratory: Negative.    Cardiovascular: Positive for chest pain.  Gastrointestinal: Negative.   Musculoskeletal: Positive for back pain.  Skin: Negative.   Psychiatric/Behavioral: Positive for dysphoric mood. The patient is nervous/anxious.   All other systems reviewed and are negative.   BP 136/92  Pulse 86  Ht 5\' 3"  (1.6 m)  Wt 162 lb 4 oz (73.596 kg)  BMI 28.75 kg/m2  Physical Exam  Nursing note and vitals reviewed. Constitutional: She is oriented to person, place, and time. She appears well-developed and well-nourished.  HENT:  Head: Normocephalic.  Nose: Nose normal.  Mouth/Throat: Oropharynx is clear and moist.  Eyes: Conjunctivae are normal. Pupils are equal, round, and reactive to light.  Neck: Normal range of motion. Neck supple. No JVD present.  Cardiovascular: Normal rate, regular rhythm, S1 normal, S2 normal, normal heart sounds and intact distal pulses.  Exam reveals no gallop and no friction rub.   No murmur heard. Well-healed mediastinal scar  Pulmonary/Chest: Effort normal and breath sounds normal. No respiratory distress. She has no wheezes. She has no rales. She exhibits no tenderness.  Abdominal: Soft. Bowel sounds are normal. She exhibits no distension. There is no tenderness.  Musculoskeletal: Normal range of motion. She exhibits no edema and no tenderness.  Lymphadenopathy:    She has no cervical adenopathy.  Neurological: She is alert and oriented to person, place, and time. Coordination normal.  Skin: Skin is warm and dry. No rash noted. No erythema.  Psychiatric: She has a normal mood and affect. Her behavior is normal. Judgment and thought content normal.    Assessment and Plan

## 2013-05-24 NOTE — Assessment & Plan Note (Signed)
Diabetes poorly controlled in the past. We have encouraged careful diet management in an effort to lose weight.

## 2013-05-24 NOTE — Assessment & Plan Note (Signed)
We have encouraged her to continue to work on weaning her cigarettes and smoking cessation. She will continue to work on this and does not want any assistance with chantix.  

## 2013-05-25 ENCOUNTER — Telehealth: Payer: Self-pay

## 2013-05-25 ENCOUNTER — Encounter: Payer: Self-pay | Admitting: *Deleted

## 2013-05-25 NOTE — Telephone Encounter (Signed)
Pt would like to schedule cath for 8/8. Please call

## 2013-05-25 NOTE — Telephone Encounter (Signed)
I have scheduled the cath for 8/7 at 9:30am and patient to arrive at 8:03am, patient is aware we were going to do on the 7th rather than the 8th.  Letter has been started with instructions, RN to complete the medications to hold and any other instructions to go over with patient

## 2013-05-26 ENCOUNTER — Other Ambulatory Visit: Payer: Self-pay

## 2013-05-26 DIAGNOSIS — Z79899 Other long term (current) drug therapy: Secondary | ICD-10-CM

## 2013-05-26 DIAGNOSIS — Z01810 Encounter for preprocedural cardiovascular examination: Secondary | ICD-10-CM

## 2013-05-26 DIAGNOSIS — R0602 Shortness of breath: Secondary | ICD-10-CM

## 2013-05-26 DIAGNOSIS — R079 Chest pain, unspecified: Secondary | ICD-10-CM

## 2013-05-26 NOTE — Telephone Encounter (Signed)
Letter completed, pt scheduled for pre procedure labs on 05/31/13 will pick up letter and CXR order at that time. Pt aware of appt date and time.

## 2013-05-31 ENCOUNTER — Ambulatory Visit: Payer: Self-pay | Admitting: Cardiovascular Disease

## 2013-05-31 ENCOUNTER — Ambulatory Visit (INDEPENDENT_AMBULATORY_CARE_PROVIDER_SITE_OTHER)

## 2013-05-31 DIAGNOSIS — Z79899 Other long term (current) drug therapy: Secondary | ICD-10-CM

## 2013-05-31 DIAGNOSIS — R079 Chest pain, unspecified: Secondary | ICD-10-CM

## 2013-05-31 DIAGNOSIS — R0602 Shortness of breath: Secondary | ICD-10-CM

## 2013-05-31 DIAGNOSIS — Z01810 Encounter for preprocedural cardiovascular examination: Secondary | ICD-10-CM

## 2013-06-01 LAB — CBC WITH DIFFERENTIAL/PLATELET
Basophils Absolute: 0 10*3/uL (ref 0.0–0.2)
Basos: 0 % (ref 0–3)
Eos: 1 % (ref 0–5)
Hemoglobin: 13.5 g/dL (ref 11.1–15.9)
Lymphs: 39 % (ref 14–46)
Monocytes: 7 % (ref 4–12)
Neutrophils Absolute: 2.6 10*3/uL (ref 1.4–7.0)
RBC: 4.45 x10E6/uL (ref 3.77–5.28)
WBC: 4.9 10*3/uL (ref 3.4–10.8)

## 2013-06-01 LAB — BASIC METABOLIC PANEL
BUN: 18 mg/dL (ref 6–24)
CO2: 21 mmol/L (ref 18–29)
Calcium: 10.3 mg/dL — ABNORMAL HIGH (ref 8.7–10.2)
Creatinine, Ser: 0.96 mg/dL (ref 0.57–1.00)
Sodium: 141 mmol/L (ref 134–144)

## 2013-06-01 LAB — PROTIME-INR
INR: 1.1 (ref 0.8–1.2)
Prothrombin Time: 11.4 s (ref 9.1–12.0)

## 2013-06-03 ENCOUNTER — Encounter: Payer: Self-pay | Admitting: Cardiovascular Disease

## 2013-06-03 ENCOUNTER — Ambulatory Visit: Payer: Self-pay | Admitting: Cardiovascular Disease

## 2013-06-03 DIAGNOSIS — I2510 Atherosclerotic heart disease of native coronary artery without angina pectoris: Secondary | ICD-10-CM

## 2013-06-03 HISTORY — PX: CARDIAC CATHETERIZATION: SHX172

## 2013-06-03 HISTORY — PX: LEFT HEART CATH AND CORS/GRAFTS ANGIOGRAPHY: CATH118250

## 2013-06-08 ENCOUNTER — Encounter: Payer: Self-pay | Admitting: *Deleted

## 2013-06-09 ENCOUNTER — Ambulatory Visit: Payer: Self-pay | Admitting: Pain Medicine

## 2013-06-15 ENCOUNTER — Ambulatory Visit: Payer: Self-pay | Admitting: Pain Medicine

## 2013-06-16 ENCOUNTER — Ambulatory Visit (INDEPENDENT_AMBULATORY_CARE_PROVIDER_SITE_OTHER): Admitting: Physician Assistant

## 2013-06-16 ENCOUNTER — Encounter: Payer: Self-pay | Admitting: Physician Assistant

## 2013-06-16 VITALS — BP 120/80 | HR 87 | Ht 63.0 in | Wt 162.0 lb

## 2013-06-16 DIAGNOSIS — F419 Anxiety disorder, unspecified: Secondary | ICD-10-CM

## 2013-06-16 DIAGNOSIS — F341 Dysthymic disorder: Secondary | ICD-10-CM

## 2013-06-16 DIAGNOSIS — R079 Chest pain, unspecified: Secondary | ICD-10-CM

## 2013-06-16 DIAGNOSIS — Z5181 Encounter for therapeutic drug level monitoring: Secondary | ICD-10-CM

## 2013-06-16 DIAGNOSIS — F17200 Nicotine dependence, unspecified, uncomplicated: Secondary | ICD-10-CM

## 2013-06-16 DIAGNOSIS — E039 Hypothyroidism, unspecified: Secondary | ICD-10-CM

## 2013-06-16 DIAGNOSIS — E119 Type 2 diabetes mellitus without complications: Secondary | ICD-10-CM

## 2013-06-16 DIAGNOSIS — F329 Major depressive disorder, single episode, unspecified: Secondary | ICD-10-CM

## 2013-06-16 DIAGNOSIS — E785 Hyperlipidemia, unspecified: Secondary | ICD-10-CM

## 2013-06-16 DIAGNOSIS — I2510 Atherosclerotic heart disease of native coronary artery without angina pectoris: Secondary | ICD-10-CM

## 2013-06-16 DIAGNOSIS — I10 Essential (primary) hypertension: Secondary | ICD-10-CM

## 2013-06-16 MED ORDER — ATORVASTATIN CALCIUM 80 MG PO TABS: 80.0000 mg | ORAL_TABLET | Freq: Every day | ORAL | Status: DC

## 2013-06-16 MED ORDER — ISOSORBIDE MONONITRATE ER 60 MG PO TB24: 60.0000 mg | ORAL_TABLET | Freq: Every day | ORAL | Status: DC

## 2013-06-16 NOTE — Assessment & Plan Note (Addendum)
Increase atorvastatin to high-dose given CAD s/p CABG and DM2. Check lipid panel, LFTs in 6 weeks.

## 2013-06-16 NOTE — Progress Notes (Signed)
Patient ID: Jennifer [name], female   DOB: 1956-09-30, 57 y.o.   MRN: [id number]            Date:  06/16/2013   [name], DOB 09/27/1956, MRN [id number]  PCP:  Andree Elk, MD  Primary Cardiologist:  Concha Se, MD   History of Present Illness: Jennifer [name] is a 57 y.o. female with PMHx s/f CAD (s/p multiple PCIs to LAD, CABG x 3 in 07/2011- LIMA-LAD, VG-D1, VG-RCA), type 2 DM, HTN, HLD, ongoing tobacco abuse, GERD, anxiety and depression who presents today for post-cath follow-up.   She saw Dr. Mariah Milling 05/24/13 for recurrent, squeezing chest despite addition of antianginals including Imdur and Ranexa. Given her cardiac history, significant cardiac RFs including ongoing tobacco abuse, poor glycemic control and symptoms concerning for accelerating angina, the decision was made to proceed with diagnostic cardiac catheterization. She presented to [facility] on 06/03/13 for this procedure:   Cardiac cath 06/03/13: 100% mid LAD ISR, large LCx with minor luminal irregularities, 40% mid RCA, patent LIMA-LAD and VG-D1, occluded VG-distal RCA/PLB; EF > 55%, no MR/AS. Medical management recommended.   She continues to experience intermittent chest pressure occurring mostly at rest during episodes of high stress. She has been more anxious at home due to family issues, and feels this has been significantly affecting her chest discomfort. She would like to establish with a psychiatrist (prior psychiatrist relocated). Next PCP follow-up in 2 months. She is requesting something for anxiety.  She continues to take most of her meds as prescribed. She reports only taking her Imdur PRN and at night when she does take it due to headache. She has also only been taking her Ranexa once a day. She continues to take ASA 324, Coreg, Norvasc, Lipitor, NTG SL PRN. Not on ACEi due to cough. Mild groin site tenderness from cath- improving daily. No bruising, swelling, bleeding, redness, fevers or discharge.   EKG: NSR, 87 bpm, TW  blunting V2, I, aVL, no ST/T changes  Wt Readings from Last 3 Encounters:  06/16/13 73.483 kg (162 lb)  05/24/13 73.596 kg (162 lb 4 oz)  02/17/13 75.524 kg (166 lb 8 oz)     Past Medical History  Diagnosis Date  . GERD (gastroesophageal reflux disease)   . Hyperlipidemia   . Hypertension   . Enlarged liver   . Diabetes mellitus   . Low back pain   . Depression with anxiety   . CAD (coronary artery disease)     Current Outpatient Prescriptions  Medication Sig Dispense Refill  . amLODipine (NORVASC) 10 MG tablet Take 1 tablet (10 mg total) by mouth daily.  30 tablet  6  . aspirin 325 MG tablet Take 325 mg by mouth daily.        Marland Kitchen atorvastatin (LIPITOR) 20 MG tablet Take 20 mg by mouth daily.      . carvedilol (COREG) 12.5 MG tablet Take 12.5 mg by mouth 2 (two) times daily with a meal.      . Cholecalciferol (VITAMIN D) 2000 UNITS tablet Take 2,000 Units by mouth daily.      . ergocalciferol (VITAMIN D2) 50000 UNITS capsule Take 50,000 Units by mouth. Twice weekly      . fenofibrate micronized (LOFIBRA) 134 MG capsule Take 134 mg by mouth daily before breakfast.      . FLUoxetine (PROZAC) 10 MG tablet Take 20 mg by mouth daily.       . Hydrocodone-Acetaminophen 10-325 MG/15ML SOLN Take by mouth as  needed.      . Insulin Isophane & Regular (HUMULIN 70/30 PEN [place]) Inject 27 Units into the skin 2 (two) times daily.      . isosorbide mononitrate (IMDUR) 30 MG 24 hr tablet Take 1 tablet (30 mg total) by mouth daily.  90 tablet  3  . levothyroxine (SYNTHROID, LEVOTHROID) 25 MCG tablet Take 25 mcg by mouth daily.      . Magnesium 400 MG CAPS Take 400 mg by mouth daily.      . metFORMIN (GLUCOPHAGE) 500 MG tablet Take 1,000 mg by mouth 2 (two) times daily with a meal.      . nitroGLYCERIN (NITROSTAT) 0.4 MG SL tablet Place 1 tablet (0.4 mg total) under the tongue every 5 (five) minutes as needed for chest pain.  25 tablet  6  . omeprazole (PRILOSEC) 20 MG capsule Take 20 mg by mouth 2  (two) times daily.      . QUEtiapine (SEROQUEL XR) 300 MG 24 hr tablet Take 300 mg by mouth at bedtime. 2 tablets every hs      . ranolazine (RANEXA) 1000 MG SR tablet Take 1 tablet (1,000 mg total) by mouth 2 (two) times daily.       No current facility-administered medications for this visit.    Allergies:   No Known Allergies  Social History:  The patient  reports that she quit smoking about 2 years ago. Her smoking use included Cigarettes. She smoked 0.00 packs per day for 16 years. She has never used smokeless tobacco. She reports that she does not drink alcohol or use illicit drugs.   Family History:  Family History  Problem Relation Age of Onset  . Hypertension Other     family hx  . Hyperlipidemia Other     family hx  . Diabetes Other     family hx  . Aneurysm Father     abdominal  . Diabetes Father   . Aneurysm Brother     abdominal  . Diabetes Mother     Review of Systems: General: negative for chills, fever, night sweats or weight changes.  Cardiovascular: positive for chest pain, negative for dyspnea on exertion, edema, orthopnea, palpitations, paroxysmal nocturnal dyspnea or shortness of breath Dermatological: negative for rash Respiratory:  negative for cough or wheezing Urologic: negative for hematuria Abdominal: negative for nausea, vomiting, diarrhea, bright red blood per rectum, melena, or hematemesis Neurologic: negative for visual changes, syncope, or dizziness Psychiatry: positive for increased stress and anxiety All other systems reviewed and are otherwise negative except as noted above.  PHYSICAL EXAM: VS:  BP 120/80  Ht 5\' 3"  (1.6 m)  Wt 73.483 kg (162 lb)  BMI 28.7 kg/m2 Well nourished, well developed, in no acute distress HEENT: normal, PERRL Neck: no JVD or bruits Cardiac:  normal S1, S2; RRR; no murmur or gallops Lungs:  clear to auscultation bilaterally, no wheezing, rhonchi or rales Abd: soft, nontender, no hepatomegaly, normoactive BS x  4 quads Ext: no edema, cyanosis or clubbing Skin: warm and dry, cap refill < 2 sec Neuro:  CNs 2-12 intact, no focal abnormalities noted Musculoskeletal: healed median sternotomy scar appreciated, strength and tone appropriate for age  Psych: normal affect, anxious-appearing, pressured speech

## 2013-06-16 NOTE — Patient Instructions (Addendum)
We will increase to Imdur to 60mg  daily. Please take Tylenol as needed for headache. These should improve over 1-2 weeks.   Please take Ranexa 1000mg  twice a day as prescribed to help alleviate angina.   We will increase Lipitor to 80mg  daily. We will recheck labwork in 6 weeks after this change (liver markers, cholesterol). You will need to be fasting for this.   We will provide a referral to psychiatry to manage anxiety and medications.   We will see you back in 1 month.   We will arrange an outpatient behavioral health referral for ongoing anxiety and depression management.

## 2013-06-16 NOTE — Assessment & Plan Note (Signed)
Contributing significantly to her chest discomfort either primarily from anxiety or exacerbating angina. Discussed stress management techniques. Will refer to behavioral health to establish care and to consider adding anxiolytics to her current psychiatric medication regimen.

## 2013-06-16 NOTE — Assessment & Plan Note (Signed)
Reports that she quit prior to her CABG. Family smokes outside. Praised on continued abstinence.

## 2013-06-16 NOTE — Assessment & Plan Note (Addendum)
The patient continues to experience chest tightness aggravated by episodes of acute stress. This may be a primary manifestation of anxiety, or an exacerbation of angina. Further qualifying her antianginal regimen, she reports not taking her Imdur consistently due to headaches and taking Ranexa only once a day. She has been on Imdur for ~ 1 year per the patient. She may have developed a tolerance, therefore will increase to 60mg  in the AM. She will take Tylenol PRN for headache. This side effect may abate in 1-2 weeks. Also stressed to take Ranexa BID. Anxiety management as below. Should she continue to have chest discomfort, consider switching carvedilol to more B1 selective BB (metoprolol). Norvasc optimized. She will continue full-dose ASA (post-CABG), increase atorvastatin to 80mg  (CAD s/p CABG, DM2), NTG SL PRN. Stressed glycemic control. Will see back in 1 month. Consider adding indefinite DAPT with prothrombotic RFs.

## 2013-06-16 NOTE — Assessment & Plan Note (Signed)
Stressed glycemic control. Continue oral hypoglycemics. Follow-up PCP.

## 2013-06-16 NOTE — Assessment & Plan Note (Signed)
BP is 120/80 today. Well-controlled on amlodipine, carvedilol, isosorbide. Cough on ACEi. If further control needed, consider ARB.

## 2013-06-17 ENCOUNTER — Telehealth: Payer: Self-pay

## 2013-06-17 NOTE — Telephone Encounter (Signed)
Message copied by Marilynne Halsted on Thu Jun 17, 2013  8:57 AM ------      Message from: Odella Aquas A      Created: Thu Jun 17, 2013  8:54 AM       Normal thyroid studies. Please inform patient. Thx ------

## 2013-06-17 NOTE — Telephone Encounter (Signed)
Pt informed of results.

## 2013-07-07 ENCOUNTER — Other Ambulatory Visit

## 2013-07-07 ENCOUNTER — Ambulatory Visit: Payer: Self-pay | Admitting: Pain Medicine

## 2013-07-16 ENCOUNTER — Ambulatory Visit (INDEPENDENT_AMBULATORY_CARE_PROVIDER_SITE_OTHER): Admitting: Cardiovascular Disease

## 2013-07-16 ENCOUNTER — Encounter: Payer: Self-pay | Admitting: Cardiovascular Disease

## 2013-07-16 VITALS — BP 130/88 | HR 97 | Ht 63.0 in | Wt 164.5 lb

## 2013-07-16 DIAGNOSIS — F329 Major depressive disorder, single episode, unspecified: Secondary | ICD-10-CM

## 2013-07-16 DIAGNOSIS — I10 Essential (primary) hypertension: Secondary | ICD-10-CM

## 2013-07-16 DIAGNOSIS — E785 Hyperlipidemia, unspecified: Secondary | ICD-10-CM

## 2013-07-16 DIAGNOSIS — F341 Dysthymic disorder: Secondary | ICD-10-CM

## 2013-07-16 DIAGNOSIS — I2510 Atherosclerotic heart disease of native coronary artery without angina pectoris: Secondary | ICD-10-CM

## 2013-07-16 DIAGNOSIS — R079 Chest pain, unspecified: Secondary | ICD-10-CM

## 2013-07-16 MED ORDER — CARVEDILOL 12.5 MG PO TABS: 12.5000 mg | ORAL_TABLET | Freq: Two times a day (BID) | ORAL | Status: DC

## 2013-07-16 NOTE — Patient Instructions (Addendum)
You are doing well.  Please hold the atorvastatin to see if leg cramps go away Call the office in 2 to 3 weeks Retry the lisinopril, hold it for chronic cough  Please call us if you have new issues that need to be addressed before your next appt.  Your physician wants you to follow-up in: 6 months.  You will receive a reminder letter in the mail two months in advance. If you don't receive a letter, please call our office to schedule the follow-up appointment.

## 2013-07-16 NOTE — Progress Notes (Signed)
Patient ID: Jennifer [name], female    DOB: 1956-03-18, 57 y.o.   MRN: [id number]  HPI Comments: 57 year old woman with a history of coronary artery disease, PTCA of the LAD with 2.5 x 28 mm Cypher stent in January 2007, Repeat catheterization August 2011 with Stent placement of the mid LAD for 90% lesion,  catheterization August 06, 2011 for chest pain that showed severe mid LAD in-stent restenosis at the site of the previous drug-eluting stent ( DES stent x2 placed in the LAD in 2007 and 2011), also with severe mid RCA disease, moderate to severe proximal diagonal #1 disease, ejection fraction 55%.   bypass surgery at [facility] which was performed on August 12 2011 by Dr. Dorris Fetch. She had bypass graft x3 with a LIMA to the LAD, vein graft to the RCA, vein graft to the diagonal.  Last catheterization June 03, 2013 Diabetes typically poorly controlled  In the past she has had problems with Chronic anxiety, stress at home, confusion at times. Treated by a psychiatrist in [place], started on Valium.  She is tired all time, sleeping a good number of hours per day. In the past she has taken benzodiazepines,  Prozac.  previously on Lamictal and Cymbalta,  She sees Dr. Sheppard Penton, also sees Dr. Wilkie Aye in [place]. She does travel up to [place] to help family .    She was having worsening chest pain, fatigue. Symptoms concerning for angina. She underwent a cardiac catheterization 06/03/2013. This showed 100% occluded mid LAD at the site of a stent, mild mid RCA disease, saphenous vein graft to the diagonal and LIMA graft to the LAD are patent. Occluded saphenous vein graft to the distal RCA. Medical management was recommended.  On today's visit she presents with her husband. She does not do any exercise, husband reports that she snacks all day. She's not reactive. She does not do any exercise. She continues to have chronic back pain. Problems with depression.  July 08 2012  right-sided chest pain.  She ruled out for myocardial infarction by cardiac enzymes.  nuclear stress test which showed no evidence of ischemia with an ejection fraction of 44%. She was noted to be hypertensive with systolic blood pressure of 180.   EKG today shows normal sinus rhythm with rate 97 beats per minute with no significant ST or T wave changes   Outpatient Encounter Prescriptions as of 07/16/2013  Medication Sig Dispense Refill  . amLODipine (NORVASC) 10 MG tablet Take 1 tablet (10 mg total) by mouth daily.  30 tablet  6  . aspirin 325 MG tablet Take 325 mg by mouth daily.        Marland Kitchen atorvastatin (LIPITOR) 80 MG tablet Take 1 tablet (80 mg total) by mouth daily.  30 tablet  3  . carvedilol (COREG) 12.5 MG tablet Take 1 tablet (12.5 mg total) by mouth 2 (two) times daily with a meal.  180 tablet  3  . Cholecalciferol (VITAMIN D) 2000 UNITS tablet Take 2,000 Units by mouth daily.      . ergocalciferol (VITAMIN D2) 50000 UNITS capsule Take 50,000 Units by mouth. Twice weekly      . fenofibrate micronized (LOFIBRA) 134 MG capsule Take 134 mg by mouth daily before breakfast.      . Hydrocodone-Acetaminophen 10-325 MG/15ML SOLN Take by mouth as needed.      . Insulin Isophane & Regular (HUMULIN 70/30 PEN [place]) Inject 27 Units into the skin 2 (two) times daily.      Marland Kitchen  isosorbide mononitrate (IMDUR) 60 MG 24 hr tablet Take 1 tablet (60 mg total) by mouth daily.  90 tablet  3  . levothyroxine (SYNTHROID, LEVOTHROID) 25 MCG tablet Take 25 mcg by mouth daily.      . Magnesium 400 MG CAPS Take 400 mg by mouth daily.      . metFORMIN (GLUCOPHAGE) 500 MG tablet Take 1,000 mg by mouth 2 (two) times daily with a meal.      . nitroGLYCERIN (NITROSTAT) 0.4 MG SL tablet Place 1 tablet (0.4 mg total) under the tongue every 5 (five) minutes as needed for chest pain.  25 tablet  6  . omeprazole (PRILOSEC) 20 MG capsule Take 20 mg by mouth 2 (two) times daily.      . QUEtiapine (SEROQUEL XR) 300 MG 24 hr tablet Take 300 mg by mouth at  bedtime. 2 tablets every hs      . ranolazine (RANEXA) 1000 MG SR tablet Take 1 tablet (1,000 mg total) by mouth 2 (two) times daily.        Review of Systems  Constitutional: Positive for fatigue.  HENT: Negative.   Eyes: Negative.   Respiratory: Negative.   Gastrointestinal: Negative.   Musculoskeletal: Positive for back pain.  Skin: Negative.   Psychiatric/Behavioral: Positive for dysphoric mood. The patient is nervous/anxious.   All other systems reviewed and are negative.   BP 130/88  Pulse 97  Ht 5\' 3"  (1.6 m)  Wt 164 lb 8 oz (74.617 kg)  BMI 29.15 kg/m2  Physical Exam  Nursing note and vitals reviewed. Constitutional: She is oriented to person, place, and time. She appears well-developed and well-nourished.  HENT:  Head: Normocephalic.  Nose: Nose normal.  Mouth/Throat: Oropharynx is clear and moist.  Eyes: Conjunctivae are normal. Pupils are equal, round, and reactive to light.  Neck: Normal range of motion. Neck supple. No JVD present.  Cardiovascular: Normal rate, regular rhythm, S1 normal, S2 normal, normal heart sounds and intact distal pulses.  Exam reveals no gallop and no friction rub.   No murmur heard. Well-healed mediastinal scar  Pulmonary/Chest: Effort normal and breath sounds normal. No respiratory distress. She has no wheezes. She has no rales. She exhibits no tenderness.  Abdominal: Soft. Bowel sounds are normal. She exhibits no distension. There is no tenderness.  Musculoskeletal: Normal range of motion. She exhibits no edema and no tenderness.  Lymphadenopathy:    She has no cervical adenopathy.  Neurological: She is alert and oriented to person, place, and time. Coordination normal.  Skin: Skin is warm and dry. No rash noted. No erythema.  Psychiatric: She has a normal mood and affect. Her behavior is normal. Judgment and thought content normal.    Assessment and Plan

## 2013-07-16 NOTE — Assessment & Plan Note (Signed)
Blood pressure is well controlled on today's visit. No changes made to the medications. 

## 2013-07-16 NOTE — Assessment & Plan Note (Signed)
Patent grafts, open native RCA. A recent cardiac catheterization August 2014. Medical management recommended.

## 2013-07-16 NOTE — Assessment & Plan Note (Signed)
She does report some recent leg cramping. We have suggested she hold her generic Lipitor for several weeks to see if symptoms resolve

## 2013-07-16 NOTE — Assessment & Plan Note (Signed)
I suspect that her chronic pain, underlying depression, poor eating habits are contributing to her symptoms. She is deconditioned, we have recommended she work on her exercise program

## 2013-08-06 ENCOUNTER — Ambulatory Visit: Payer: Self-pay | Admitting: Pain Medicine

## 2013-09-10 ENCOUNTER — Other Ambulatory Visit: Payer: Self-pay | Admitting: Pain Medicine

## 2013-09-18 ENCOUNTER — Inpatient Hospital Stay: Payer: Self-pay | Admitting: Surgery

## 2013-09-18 LAB — CK TOTAL AND CKMB (NOT AT ARMC)
CK, Total: 64 U/L (ref 21–215)
CK-MB: 4 ng/mL — ABNORMAL HIGH (ref 0.5–3.6)

## 2013-09-18 LAB — URINALYSIS, COMPLETE
Bacteria: NONE SEEN
Blood: NEGATIVE
Ketone: NEGATIVE
Nitrite: NEGATIVE
Squamous Epithelial: <1

## 2013-09-18 LAB — COMPREHENSIVE METABOLIC PANEL
Albumin: 4.7 g/dL (ref 3.4–5.0)
Anion Gap: 7 (ref 7–16)
Calcium, Total: 10.3 mg/dL — ABNORMAL HIGH (ref 8.5–10.1)
Chloride: 101 mmol/L (ref 98–107)
Co2: 27 mmol/L (ref 21–32)
Creatinine: 1.01 mg/dL (ref 0.60–1.30)
EGFR (African American): >60
EGFR (Non-African Amer.): >60
SGOT(AST): 30 U/L (ref 15–37)
SGPT (ALT): 30 U/L (ref 12–78)
Sodium: 135 mmol/L — ABNORMAL LOW (ref 136–145)

## 2013-09-18 LAB — CBC WITH DIFFERENTIAL/PLATELET
Eosinophil #: 0.1 10*3/uL (ref 0.0–0.7)
Eosinophil %: 0.7 %
HCT: 44 % (ref 35.0–47.0)
HGB: 14.6 g/dL (ref 12.0–16.0)
Lymphocyte #: 1.6 10*3/uL (ref 1.0–3.6)
Lymphocyte %: 11.1 %
MCH: 29.5 pg (ref 26.0–34.0)
MCHC: 33.1 g/dL (ref 32.0–36.0)
RDW: 13.4 % (ref 11.5–14.5)

## 2013-09-18 LAB — LIPASE, BLOOD: Lipase: 150 U/L (ref 73–393)

## 2013-09-19 LAB — CBC WITH DIFFERENTIAL/PLATELET
Basophil #: 0 10*3/uL (ref 0.0–0.1)
HGB: 12.3 g/dL (ref 12.0–16.0)
Lymphocyte #: 2.4 10*3/uL (ref 1.0–3.6)
Lymphocyte %: 36.6 %
MCH: 29.5 pg (ref 26.0–34.0)
MCV: 89 fL (ref 80–100)
Monocyte #: 0.4 x10 3/mm (ref 0.2–0.9)
Monocyte %: 5.6 %
Neutrophil %: 54.8 %
Platelet: 259 10*3/uL (ref 150–440)
RDW: 13.5 % (ref 11.5–14.5)
WBC: 6.5 10*3/uL (ref 3.6–11.0)

## 2013-09-19 LAB — BASIC METABOLIC PANEL
BUN: 9 mg/dL (ref 7–18)
Calcium, Total: 8.5 mg/dL (ref 8.5–10.1)
Chloride: 106 mmol/L (ref 98–107)
Glucose: 135 mg/dL — ABNORMAL HIGH (ref 65–99)
Potassium: 3.9 mmol/L (ref 3.5–5.1)
Sodium: 137 mmol/L (ref 136–145)

## 2013-11-24 ENCOUNTER — Ambulatory Visit (INDEPENDENT_AMBULATORY_CARE_PROVIDER_SITE_OTHER): Admitting: Cardiovascular Disease

## 2013-11-24 ENCOUNTER — Encounter: Payer: Self-pay | Admitting: Cardiovascular Disease

## 2013-11-24 ENCOUNTER — Telehealth: Payer: Self-pay

## 2013-11-24 VITALS — BP 112/84 | HR 82 | Ht 63.0 in | Wt 167.5 lb

## 2013-11-24 DIAGNOSIS — R0609 Other forms of dyspnea: Secondary | ICD-10-CM

## 2013-11-24 DIAGNOSIS — R079 Chest pain, unspecified: Secondary | ICD-10-CM

## 2013-11-24 DIAGNOSIS — R0600 Dyspnea, unspecified: Secondary | ICD-10-CM | POA: Insufficient documentation

## 2013-11-24 DIAGNOSIS — I10 Essential (primary) hypertension: Secondary | ICD-10-CM

## 2013-11-24 DIAGNOSIS — R0989 Other specified symptoms and signs involving the circulatory and respiratory systems: Secondary | ICD-10-CM

## 2013-11-24 DIAGNOSIS — I2510 Atherosclerotic heart disease of native coronary artery without angina pectoris: Secondary | ICD-10-CM

## 2013-11-24 MED ORDER — LISINOPRIL 40 MG PO TABS: 40.0000 mg | ORAL_TABLET | Freq: Every day | ORAL | Status: DC

## 2013-11-24 NOTE — Telephone Encounter (Signed)
This encounter was created in error - please disregard.

## 2013-11-24 NOTE — Patient Instructions (Signed)
Your physician recommends that you return for lab work in: Today  BMP BNP   Your physician has requested that you have an echocardiogram. Echocardiography is a painless test that uses sound waves to create images of your heart. It provides your doctor with information about the size and shape of your heart and how well your heart's chambers and valves are working. This procedure takes approximately one hour. There are no restrictions for this procedure.  Your physician has recommended you make the following change in your medication:  Increase Lisinopril 40 mg daily   Your physician recommends that you schedule a follow-up appointment in:  Follow up with Dr. Rockey Situ after your echo.

## 2013-11-24 NOTE — Progress Notes (Signed)
Patient ID: Jennifer [name], female    DOB: 08-21-1956, 58 y.o.   MRN: [id number]  HPI Comments: [name] of Dr. Rockey Situ was added to my schedule due to dyspnea and elevated blood pressure. She is a 58 year old woman with a history of coronary artery disease status post CABG in October of 2012 with a LIMA to the LAD, vein graft to the RCA, vein graft to the diagonal, Diabetes typically poorly controlled  In the past she has had problems with Chronic anxiety, stress at home, confusion at times.   Most recent cardiac catheterization 06/03/2013. This showed 100% occluded mid LAD at the site of a stent, mild mid RCA disease, saphenous vein graft to the diagonal and LIMA graft to the LAD are patent. Occluded saphenous vein graft to the distal RCA. Medical management was recommended.  July 08 2012  right-sided chest pain. She ruled out for myocardial infarction by cardiac enzymes.  nuclear stress test which showed no evidence of ischemia with an ejection fraction of 44%. She was noted to be hypertensive with systolic blood pressure of 180.   She reports being sick over the last 2 weeks with increased dyspnea, feeling congested in her lungs. She took Mucinex. She has been feeling tired. Blood pressure has been running high at home with readings about 196 systolic. She saw Dr. Eliberto Ivory yesterday and was also found to have elevated blood pressure. She denies orthopnea or PND. She is concerned about possibility of congestive heart failure. She does not have history of this in the past as far as I can see. Blood pressure was elevated this morning. It improved after she took carvedilol and amlodipine.   Outpatient Encounter Prescriptions as of 07/16/2013  Medication Sig Dispense Refill  . amLODipine (NORVASC) 10 MG tablet Take 1 tablet (10 mg total) by mouth daily.  30 tablet  6  . aspirin 325 MG tablet Take 325 mg by mouth daily.        Marland Kitchen atorvastatin (LIPITOR) 80 MG tablet Take 1 tablet (80 mg  total) by mouth daily.  30 tablet  3  . carvedilol (COREG) 12.5 MG tablet Take 1 tablet (12.5 mg total) by mouth 2 (two) times daily with a meal.  180 tablet  3  . Cholecalciferol (VITAMIN D) 2000 UNITS tablet Take 2,000 Units by mouth daily.      . ergocalciferol (VITAMIN D2) 50000 UNITS capsule Take 50,000 Units by mouth. Twice weekly      . fenofibrate micronized (LOFIBRA) 134 MG capsule Take 134 mg by mouth daily before breakfast.      . Hydrocodone-Acetaminophen 10-325 MG/15ML SOLN Take by mouth as needed.      . Insulin Isophane & Regular (HUMULIN 70/30 PEN [place]) Inject 27 Units into the skin 2 (two) times daily.      . isosorbide mononitrate (IMDUR) 60 MG 24 hr tablet Take 1 tablet (60 mg total) by mouth daily.  90 tablet  3  . levothyroxine (SYNTHROID, LEVOTHROID) 25 MCG tablet Take 25 mcg by mouth daily.      . Magnesium 400 MG CAPS Take 400 mg by mouth daily.      . metFORMIN (GLUCOPHAGE) 500 MG tablet Take 1,000 mg by mouth 2 (two) times daily with a meal.      . nitroGLYCERIN (NITROSTAT) 0.4 MG SL tablet Place 1 tablet (0.4 mg total) under the tongue every 5 (five) minutes as needed for chest pain.  25 tablet  6  . omeprazole (PRILOSEC)  20 MG capsule Take 20 mg by mouth 2 (two) times daily.      . QUEtiapine (SEROQUEL XR) 300 MG 24 hr tablet Take 300 mg by mouth at bedtime. 2 tablets every hs      . ranolazine (RANEXA) 1000 MG SR tablet Take 1 tablet (1,000 mg total) by mouth 2 (two) times daily.        Review of Systems  Constitutional: Positive for fatigue.  HENT: Negative.   Eyes: Negative.   Respiratory: Dyspnea and congestion Gastrointestinal: Negative.   Musculoskeletal: Positive for back pain.  Skin: Negative.   Psychiatric/Behavioral: Positive for dysphoric mood. The patient is nervous/anxious.   All other systems reviewed and are negative.   BP 112/84  Pulse 82  Ht 5\' 3"  (1.6 m)  Wt 167 lb 8 oz (75.978 kg)  BMI 29.68 kg/m2  Physical Exam  Nursing note and vitals  reviewed. Constitutional: She is oriented to person, place, and time. She appears well-developed and well-nourished.  HENT:  Head: Normocephalic.  Nose: Nose normal.  Mouth/Throat: Oropharynx is clear and moist.  Eyes: Conjunctivae are normal. Pupils are equal, round, and reactive to light.  Neck: Normal range of motion. Neck supple. No JVD present.  Cardiovascular: Normal rate, regular rhythm, S1 normal, S2 normal, normal heart sounds and intact distal pulses.  Exam reveals no gallop and no friction rub.   No murmur heard. Well-healed mediastinal scar  Pulmonary/Chest: Effort normal and breath sounds normal. No respiratory distress. She has no wheezes. She has no rales. She exhibits no tenderness.  Abdominal: Soft. Bowel sounds are normal. She exhibits no distension. There is no tenderness.  Musculoskeletal: Normal range of motion. She exhibits no edema and no tenderness.  Lymphadenopathy:    She has no cervical adenopathy.  Neurological: She is alert and oriented to person, place, and time. Coordination normal.  Skin: Skin is warm and dry. No rash noted. No erythema.  Psychiatric: She has a normal mood and affect. Her behavior is normal. Judgment and thought content normal.   EKG: Normal sinus rhythm with nonspecific T wave changes.   Assessment and Plan

## 2013-11-24 NOTE — Telephone Encounter (Signed)
Pt called reporting that her BP has been elevated "for the last few months", reports systolic has been >097, diastolic >353. Pt reports that she has been sick for the past 2 weeks and did not take her meds as prescribed during that time. Pt saw Dr. Eliberto Ivory yesterday and was instructed to go to ED for systolic pressures >299, but she reports that they have been consistently high. Pt reports chronic headache, increased "fuzziness" in her left eye, and that she checks her BP about 5 x a day. Pt reports frequent, sporadic pain that shoots across her chest and through her shoulders. Pt states that this occurred while she was at PCP's office yesterday, reports Dr. Eliberto Ivory stepped out of the exam room and when he came back in, she was holding her chest. Had lengthy discussion w/ pt about the need to address these issues sooner,  not to check her BP so often, and to report to [place] any chest pain. Pt requests that Dr. Rockey Situ review her chart and make any necessary changes in her med regimen to get her BP down. Advised pt that Dr. Rockey Situ has been out of the office and will not be able to address phone messages until after clinic and that I highly recommend that she be seen for evaluation. Pt states that he would prefer to wait on a phone call. Advised pt that Dr. Fletcher Anon could see her today if she was willing to come in, as she has seen him previously, but she reports that she only wants Dr. Rockey Situ. Pt is sched to see Dr. Rockey Situ this Friday 11/26/13 @ 7:45. Advised pt to take her meds as prescribed, as she has not taken any meds as of yet today.  She is agreeable to this and will await our recommendation.

## 2013-11-24 NOTE — Telephone Encounter (Signed)
Pt came in today for ov w/ Dr. Fletcher Anon.

## 2013-11-24 NOTE — Assessment & Plan Note (Signed)
She has chronic atypical chest pain. Most recent cardiac catheterization in August of 2014 showed patent grafts except for SVG to RCA. However, there was no obstructive disease in the native RCA. Recommend continuing medical therapy.

## 2013-11-24 NOTE — Assessment & Plan Note (Signed)
The patient is complaining of increased dyspnea over the last few weeks. It appears from her description that she likely had upper respiratory tract infection. She is concerned about the possibility of congestive heart failure. Previous ejection fraction was 44% by nuclear stress test with no recent evaluation. I don't see evidence of fluid overload by physical exam. I will check basic metabolic profile and BNP today. I will also request an echocardiogram to evaluate LV systolic function and pulmonary pressure.

## 2013-11-24 NOTE — Assessment & Plan Note (Signed)
She reports consistently high blood pressure readings at home. She reports that her blood pressure machine is accurate. I recommend increasing the dose of lisinopril to 40 mg once daily. She is to continue monitoring her blood pressure.  Followup with Dr. Rockey Situ after echo.

## 2013-11-25 LAB — BASIC METABOLIC PANEL
BUN/Creatinine Ratio: 23 (ref 9–23)
BUN: 14 mg/dL (ref 6–24)
CALCIUM: 10 mg/dL (ref 8.7–10.2)
CO2: 20 mmol/L (ref 18–29)
Chloride: 100 mmol/L (ref 97–108)
Creatinine, Ser: 0.61 mg/dL (ref 0.57–1.00)
GFR calc Af Amer: 116 mL/min/{1.73_m2} (ref >59)
GFR, EST NON AFRICAN AMERICAN: 100 mL/min/{1.73_m2} (ref >59)
GLUCOSE: 198 mg/dL — AB (ref 65–99)
Potassium: 5.6 mmol/L — ABNORMAL HIGH (ref 3.5–5.2)
SODIUM: 139 mmol/L (ref 134–144)

## 2013-11-25 LAB — BRAIN NATRIURETIC PEPTIDE: BNP: 20.2 pg/mL (ref 0.0–100.0)

## 2013-11-26 ENCOUNTER — Ambulatory Visit: Admitting: Cardiovascular Disease

## 2013-11-30 ENCOUNTER — Other Ambulatory Visit (INDEPENDENT_AMBULATORY_CARE_PROVIDER_SITE_OTHER)

## 2013-11-30 DIAGNOSIS — R0609 Other forms of dyspnea: Secondary | ICD-10-CM

## 2013-11-30 DIAGNOSIS — I2510 Atherosclerotic heart disease of native coronary artery without angina pectoris: Secondary | ICD-10-CM

## 2013-11-30 DIAGNOSIS — R0989 Other specified symptoms and signs involving the circulatory and respiratory systems: Secondary | ICD-10-CM

## 2013-11-30 DIAGNOSIS — R079 Chest pain, unspecified: Secondary | ICD-10-CM

## 2013-11-30 DIAGNOSIS — R0600 Dyspnea, unspecified: Secondary | ICD-10-CM

## 2013-12-01 ENCOUNTER — Telehealth: Payer: Self-pay

## 2013-12-01 NOTE — Telephone Encounter (Signed)
Spoke w/ pt.  She reports that her BP was 172/112 last night, 169/124 yesterday in another office. She reports that she is currently under a lot of personal stress. She states that she is going to a funeral soon and that someone from her past will be there, and that her husband would like to confront him about a past incident. She states that she feels caught in the middle and her BP and HR have gone up due to this situation. Had lengthy discussion w/ pt about relaxation techniques and the need to take her meds as prescribed. Advised pt to contact her PCP for a renewal of her xanax rx, as she states that she is out of this and has not taken it recently. Advised pt to call if BP does not come down, if she has a headache, changes in her vision, difficulty speaking or walking, or notices facial drooping. She verbalizes understanding and seemed to calm down. Pt to call if we can be of further assistance.

## 2013-12-03 ENCOUNTER — Ambulatory Visit: Payer: Self-pay | Admitting: Pain Medicine

## 2013-12-06 ENCOUNTER — Ambulatory Visit (INDEPENDENT_AMBULATORY_CARE_PROVIDER_SITE_OTHER): Admitting: Physician Assistant

## 2013-12-06 DIAGNOSIS — I10 Essential (primary) hypertension: Secondary | ICD-10-CM

## 2013-12-06 NOTE — Progress Notes (Deleted)
Patient ID: Jennifer [name], female   DOB: 1956/07/26, 58 y.o.   MRN: [id number]   Date:  12/06/2013   [name], DOB [birth year], MRN [id number]  PCP:  Estanislado Spire, MD  Primary Cardiologist:  Jerilynn Mages. Fletcher Anon, MD   History of Present Illness:  Jennifer [name] is a 58 y.o. female with PMHx s/f CAD (s/p multiple PCIs to LAD, CABG x 3 in 07/2011- LIMA-LAD, VG-D1, VG-RCA), type 2 DM, HTN, HLD, ongoing tobacco abuse, GERD, anxiety and depression who presents today for follow-up.   Cardiac cath 06/03/13: 100% mid LAD ISR, large LCx with minor luminal irregularities, 40% mid RCA, patent LIMA-LAD and VG-D1, occluded VG-distal RCA/PLB; EF > 55%, no MR/AS. Medical management recommended.   She saw Dr. Fletcher Anon two weeks ago for dyspnea and elevated BP. She was recovering from a URI. She was not found to be volume overloaded on exam, however she was concerned about CHF. BMET 1/28- normal renal function, K 5.6. TSH, free T4 WNL 05/2013. BNP WNL. Given her cardiac history, a TTE was performed 11/30/13 revealing normal EF, normal R sided pressures, mild LVH.   -- Evaluate BP -- Resolution of URI -- Echo findings -- Stress management -- Tobacco abuse  EKG: ***  Wt Readings from Last 3 Encounters:  11/24/13 167 lb 8 oz (75.978 kg)  07/16/13 164 lb 8 oz (74.617 kg)  06/16/13 162 lb (73.483 kg)     Past Medical History  Diagnosis Date  . GERD (gastroesophageal reflux disease)   . Hyperlipidemia   . Hypertension   . Enlarged liver   . Diabetes mellitus   . Low back pain   . Depression with anxiety   . CAD (coronary artery disease)     Current Outpatient Prescriptions  Medication Sig Dispense Refill  . albuterol-ipratropium (COMBIVENT) 18-103 MCG/ACT inhaler Inhale into the lungs as needed for wheezing or shortness of breath.      . ALPRAZolam (XANAX) 1 MG tablet Take 1 mg by mouth at bedtime as needed for anxiety.      Marland Kitchen aspirin 325 MG tablet Take 325 mg by mouth daily.        Marland Kitchen atorvastatin  (LIPITOR) 40 MG tablet Take 40 mg by mouth daily.      . carvedilol (COREG) 12.5 MG tablet Take 1 tablet (12.5 mg total) by mouth 2 (two) times daily with a meal.  180 tablet  3  . Cholecalciferol (VITAMIN D) 2000 UNITS tablet Take 2,000 Units by mouth daily.      . fenofibrate micronized (LOFIBRA) 134 MG capsule Take 134 mg by mouth daily before breakfast.      . fluticasone (FLONASE) 50 MCG/ACT nasal spray Place 2 sprays into both nostrils as needed for allergies or rhinitis.      Marland Kitchen Hydrocodone-Acetaminophen 10-325 MG/15ML SOLN Take by mouth as needed.      . Insulin Isophane & Regular (HUMULIN 70/30 PEN [place]) Inject 27 Units into the skin 2 (two) times daily.       . isosorbide mononitrate (IMDUR) 60 MG 24 hr tablet Take 1 tablet (60 mg total) by mouth daily.  90 tablet  3  . levothyroxine (SYNTHROID, LEVOTHROID) 50 MCG tablet Take 50 mcg by mouth daily before breakfast.      . lisinopril (PRINIVIL,ZESTRIL) 40 MG tablet Take 1 tablet (40 mg total) by mouth daily.  90 tablet  3  . Magnesium 400 MG CAPS Take 400 mg by mouth daily.      Marland Kitchen  metFORMIN (GLUCOPHAGE) 500 MG tablet Take 1,000 mg by mouth 2 (two) times daily with a meal.      . methocarbamol (ROBAXIN) 500 MG tablet Take 500 mg by mouth as needed for muscle spasms.      . nitroGLYCERIN (NITROSTAT) 0.4 MG SL tablet Place 1 tablet (0.4 mg total) under the tongue every 5 (five) minutes as needed for chest pain.  25 tablet  6  . omeprazole (PRILOSEC) 20 MG capsule Take 20 mg by mouth daily.       . QUEtiapine (SEROQUEL XR) 300 MG 24 hr tablet Take 600 mg by mouth at bedtime. 2 tablets every hs      . ranolazine (RANEXA) 1000 MG SR tablet Take 1 tablet (1,000 mg total) by mouth 2 (two) times daily.      . Vilazodone HCl (VIIBRYD) 10 MG TABS Take 10 mg by mouth daily.       No current facility-administered medications for this visit.    Allergies:    Allergies  Allergen Reactions  . Ace Inhibitors     Cough    Social History:  The  patient  reports that she has been smoking Cigarettes.  She has been smoking about 0.00 packs per day for the past 16 years. She has never used smokeless tobacco. She reports that she does not drink alcohol or use illicit drugs.   Family History:  Family History  Problem Relation Age of Onset  . Hypertension Other     family hx  . Hyperlipidemia Other     family hx  . Diabetes Other     family hx  . Aneurysm Father     abdominal  . Diabetes Father   . Aneurysm Brother     abdominal  . Diabetes Mother     Review of Systems: General: *** negative for chills, fever, night sweats or weight changes.  Cardiovascular: *** negative for chest pain, dyspnea on exertion, edema, orthopnea, palpitations, paroxysmal nocturnal dyspnea or shortness of breath Dermatological: *** negative for rash Respiratory: *** negative for cough or wheezing Urologic: *** negative for hematuria Abdominal: *** negative for nausea, vomiting, diarrhea, bright red blood per rectum, melena, or hematemesis Neurologic: *** negative for visual changes, syncope, or dizziness All other systems reviewed and are otherwise negative except as noted above.  PHYSICAL EXAM: VS:  There were no vitals taken for this visit. Well nourished, well developed, in no acute distress HEENT: normal, PERRL Neck: no JVD or bruits Cardiac:  normal S1, S2; RRR; no murmur or gallops Lungs:  clear to auscultation bilaterally, no wheezing, rhonchi or rales Abd: soft, nontender, no hepatomegaly, normoactive BS x 4 quads Ext: no edema, cyanosis or clubbing Skin: warm and dry, cap refill < 2 sec Neuro:  CNs 2-12 intact, no focal abnormalities noted Musculoskeletal: strength and tone appropriate for age  Psych: normal affect

## 2013-12-07 ENCOUNTER — Telehealth: Payer: Self-pay

## 2013-12-07 NOTE — Progress Notes (Signed)
Error

## 2013-12-07 NOTE — Telephone Encounter (Signed)
Pt son is calling stating pt BP is elevated. States last readings are 178/112, 187/115, 150/89. Pt son states that she has "flu like symptoms, has a little bit of shakes to her, her head feels cool, but her body is hot". Please call.

## 2013-12-07 NOTE — Telephone Encounter (Signed)
Spoke w/ pt's daughter, Theadora Rama.  She states that pt is not feeling well, her BP has been up, and she has been vomiting.  Advised her to call pt's PCP and see about getting an appt. She is agreeable to this and will call back if we can be of any assistance.

## 2013-12-08 ENCOUNTER — Observation Stay: Payer: Self-pay | Admitting: Internal Medicine

## 2013-12-08 LAB — CBC WITH DIFFERENTIAL/PLATELET
Basophil #: 0 10*3/uL (ref 0.0–0.1)
Basophil %: 0.3 %
EOS PCT: 0.2 %
Eosinophil #: 0 10*3/uL (ref 0.0–0.7)
HCT: 49.6 % — ABNORMAL HIGH (ref 35.0–47.0)
HGB: 16.5 g/dL — ABNORMAL HIGH (ref 12.0–16.0)
LYMPHS PCT: 26.6 %
Lymphocyte #: 3 10*3/uL (ref 1.0–3.6)
MCH: 29.8 pg (ref 26.0–34.0)
MCHC: 33.3 g/dL (ref 32.0–36.0)
MCV: 89 fL (ref 80–100)
MONO ABS: 0.8 x10 3/mm (ref 0.2–0.9)
Monocyte %: 6.9 %
Neutrophil #: 7.4 10*3/uL — ABNORMAL HIGH (ref 1.4–6.5)
Neutrophil %: 66 %
PLATELETS: 416 10*3/uL (ref 150–440)
RBC: 5.55 10*6/uL — ABNORMAL HIGH (ref 3.80–5.20)
RDW: 15.3 % — ABNORMAL HIGH (ref 11.5–14.5)
WBC: 11.2 10*3/uL — AB (ref 3.6–11.0)

## 2013-12-08 LAB — COMPREHENSIVE METABOLIC PANEL
ANION GAP: 8 (ref 7–16)
AST: 20 U/L (ref 15–37)
Albumin: 4.8 g/dL (ref 3.4–5.0)
Alkaline Phosphatase: 104 U/L
BUN: 24 mg/dL — ABNORMAL HIGH (ref 7–18)
Bilirubin,Total: 0.6 mg/dL (ref 0.2–1.0)
Calcium, Total: 10.7 mg/dL — ABNORMAL HIGH (ref 8.5–10.1)
Chloride: 97 mmol/L — ABNORMAL LOW (ref 98–107)
Co2: 31 mmol/L (ref 21–32)
Creatinine: 0.88 mg/dL (ref 0.60–1.30)
EGFR (African American): >60
Glucose: 240 mg/dL — ABNORMAL HIGH (ref 65–99)
Osmolality: 284 (ref 275–301)
Potassium: 3.4 mmol/L — ABNORMAL LOW (ref 3.5–5.1)
SGPT (ALT): 29 U/L (ref 12–78)
Sodium: 136 mmol/L (ref 136–145)
Total Protein: 9.1 g/dL — ABNORMAL HIGH (ref 6.4–8.2)

## 2013-12-08 LAB — LIPASE, BLOOD: LIPASE: 399 U/L — AB (ref 73–393)

## 2013-12-09 ENCOUNTER — Ambulatory Visit: Admitting: Cardiovascular Disease

## 2013-12-09 LAB — BASIC METABOLIC PANEL
Anion Gap: 6 — ABNORMAL LOW (ref 7–16)
BUN: 21 mg/dL — AB (ref 7–18)
CHLORIDE: 107 mmol/L (ref 98–107)
CREATININE: 0.83 mg/dL (ref 0.60–1.30)
Calcium, Total: 8.9 mg/dL (ref 8.5–10.1)
Co2: 27 mmol/L (ref 21–32)
EGFR (African American): >60
EGFR (Non-African Amer.): >60
GLUCOSE: 167 mg/dL — AB (ref 65–99)
OSMOLALITY: 286 (ref 275–301)
Potassium: 4 mmol/L (ref 3.5–5.1)
SODIUM: 140 mmol/L (ref 136–145)

## 2013-12-09 LAB — URINALYSIS, COMPLETE
BACTERIA: NONE SEEN
BILIRUBIN, UR: NEGATIVE
Blood: NEGATIVE
GLUCOSE, UR: NEGATIVE mg/dL (ref 0–75)
Ketone: NEGATIVE
Leukocyte Esterase: NEGATIVE
Nitrite: NEGATIVE
Ph: 5 (ref 4.5–8.0)
RBC,UR: 1 /HPF (ref 0–5)
Specific Gravity: 1.028 (ref 1.003–1.030)
WBC UR: 2 /HPF (ref 0–5)

## 2013-12-09 LAB — MAGNESIUM: MAGNESIUM: 2 mg/dL

## 2013-12-15 ENCOUNTER — Encounter: Payer: Self-pay | Admitting: Cardiovascular Disease

## 2013-12-15 ENCOUNTER — Ambulatory Visit (INDEPENDENT_AMBULATORY_CARE_PROVIDER_SITE_OTHER): Admitting: Cardiovascular Disease

## 2013-12-15 VITALS — BP 122/90 | HR 98 | Ht 63.0 in | Wt 162.8 lb

## 2013-12-15 DIAGNOSIS — E119 Type 2 diabetes mellitus without complications: Secondary | ICD-10-CM

## 2013-12-15 DIAGNOSIS — I160 Hypertensive urgency: Secondary | ICD-10-CM | POA: Insufficient documentation

## 2013-12-15 DIAGNOSIS — I10 Essential (primary) hypertension: Secondary | ICD-10-CM

## 2013-12-15 DIAGNOSIS — F17200 Nicotine dependence, unspecified, uncomplicated: Secondary | ICD-10-CM

## 2013-12-15 DIAGNOSIS — E785 Hyperlipidemia, unspecified: Secondary | ICD-10-CM

## 2013-12-15 DIAGNOSIS — I2510 Atherosclerotic heart disease of native coronary artery without angina pectoris: Secondary | ICD-10-CM

## 2013-12-15 NOTE — Patient Instructions (Addendum)
Please restart isosorbide one a day  Monitor your blood pressure  If it runs high, Take NTG up to three times, Ok to take with xanax  You can also take extra isosorbide (1/2 ) or extra coreg for high blood pressure  Please call us if you have new issues that need to be addressed before your next appt.  Your physician wants you to follow-up in: 6 months.  You will receive a reminder letter in the mail two months in advance. If you don't receive a letter, please call our office to schedule the follow-up appointment.

## 2013-12-15 NOTE — Assessment & Plan Note (Signed)
We have encouraged her to stay on her Lipitor. Goal LDL less than 70

## 2013-12-15 NOTE — Assessment & Plan Note (Signed)
Labile pressures. She's not taking isosorbide and we have suggested she restart this. Continue carvedilol twice a day as well as lisinopril daily. 4 blood pressure spikes in the setting of stress, we have suggested she take her Xanax if she has stress, and if needed she could take nitroglycerin sublingual, even extra Coreg or one half isosorbide if needed for blood pressure control

## 2013-12-15 NOTE — Assessment & Plan Note (Signed)
We have encouraged continued exercise, careful diet management in an effort to lose weight. 

## 2013-12-15 NOTE — Assessment & Plan Note (Signed)
Currently with no symptoms of angina. No further workup at this time. Continue current medication regimen. 

## 2013-12-15 NOTE — Assessment & Plan Note (Signed)
We have encouraged her to continue to work on weaning her cigarettes and smoking cessation. She will continue to work on this and does not want any assistance with chantix.  

## 2013-12-15 NOTE — Progress Notes (Signed)
Patient ID: Jennifer [name], female    DOB: [birth year], 58 y.o.   MRN: [id number]  HPI Comments: 58 year old woman with a history of coronary artery disease, PTCA of the LAD with 2.5 x 28 mm Cypher stent in January 2007, Repeat catheterization August 2011 with Stent placement of the mid LAD for 90% lesion,  catheterization August 06, 2011 for chest pain that showed severe mid LAD in-stent restenosis at the site of the previous drug-eluting stent ( DES stent x2 placed in the LAD in 2007 and 2011), also with severe mid RCA disease, moderate to severe proximal diagonal #1 disease, ejection fraction 55%. She presents today after recent hospitalization at [facility] on 12/09/2013  bypass surgery at [facility] which was performed on August 12 2011 by Dr. Roxan Hockey. She had bypass graft x3 with a LIMA to the LAD, vein graft to the RCA, vein graft to the diagonal.  Last catheterization June 03, 2013 Diabetes typically poorly controlled   problems with Chronic anxiety, stress at home, confusion at times. Treated by a psychiatrist in [place], started on Valium.  She is tired all time, sleeping a good number of hours per day. In the past she has taken benzodiazepines,  Prozac.  previously on Lamictal and Cymbalta,  She sees Dr. Eliberto Ivory, also sees Dr. Leilani Merl in [place]. She does travel up to [place] to help family .    Previous visit and was having worsening chest pain, fatigue. Symptoms concerning for angina. She underwent a cardiac catheterization 06/03/2013. This showed 100% occluded mid LAD at the site of a stent, mild mid RCA disease, saphenous vein graft to the diagonal and LIMA graft to the LAD are patent. Occluded saphenous vein graft to the distal RCA. Medical management was recommended.   She does not do any exercise, husband reports that she snacks all day. She's not active. She continues to have chronic back pain. Problems with depression. Room in followup today, she presents with her son. She was  admitted to the hospital February 11 and discharged 12/09/2013. She was kept overnight for symptoms of severe hypertension on arrival in the setting of general malaise, nausea vomiting felt secondary to viral illness and low potassium. Etiology of her symptoms was unclear though concerning for viral etiology. Unable to exclude anxiety component she reports that she was given Xanax by her psychiatrist. CT scan of the head in the hospital there were 59 2015 showed no abnormalities. Her son who presents with her today has several questions on how to manage her labile blood pressure. Sometimes it seems to go up to 180, typically in the setting of stress or anxiety she has not been taking isosorbide.   July 08 2012  right-sided chest pain. She ruled out for myocardial infarction by cardiac enzymes.  nuclear stress test which showed no evidence of ischemia with an ejection fraction of 44%. She was noted to be hypertensive with systolic blood pressure of 180.   EKG today shows normal sinus rhythm with rate 98 beats per minute with no significant ST or T wave changes   Outpatient Encounter Prescriptions as of 12/15/2013  Medication Sig  . albuterol-ipratropium (COMBIVENT) 18-103 MCG/ACT inhaler Inhale into the lungs as needed for wheezing or shortness of breath.  . ALPRAZolam (XANAX) 1 MG tablet Take 1 mg by mouth at bedtime as needed for anxiety.  Marland Kitchen aspirin 325 MG tablet Take 325 mg by mouth daily.    Marland Kitchen atorvastatin (LIPITOR) 40 MG tablet Take 40 mg by mouth daily.  Marland Kitchen  carvedilol (COREG) 12.5 MG tablet Take 1 tablet (12.5 mg total) by mouth 2 (two) times daily with a meal.  . Cholecalciferol (VITAMIN D) 2000 UNITS tablet Take 2,000 Units by mouth daily.  . fenofibrate micronized (LOFIBRA) 134 MG capsule Take 134 mg by mouth daily before breakfast.  . Hydrocodone-Acetaminophen 10-325 MG/15ML SOLN Take by mouth as needed.  . Insulin Isophane & Regular (HUMULIN 70/30 PEN ) Inject 27 Units into the skin  2 (two) times daily.   . isosorbide mononitrate (IMDUR) 60 MG 24 hr tablet Take 1 tablet (60 mg total) by mouth daily.  Marland Kitchen levothyroxine (SYNTHROID, LEVOTHROID) 50 MCG tablet Take 50 mcg by mouth daily before breakfast.  . lisinopril (PRINIVIL,ZESTRIL) 40 MG tablet Take 1 tablet (40 mg total) by mouth daily.  . Magnesium 400 MG CAPS Take 400 mg by mouth daily.  . metFORMIN (GLUCOPHAGE) 500 MG tablet Take 1,000 mg by mouth 2 (two) times daily with a meal.  . nitroGLYCERIN (NITROSTAT) 0.4 MG SL tablet Place 1 tablet (0.4 mg total) under the tongue every 5 (five) minutes as needed for chest pain.  Marland Kitchen omeprazole (PRILOSEC) 20 MG capsule Take 20 mg by mouth daily.   . QUEtiapine (SEROQUEL XR) 300 MG 24 hr tablet Take 600 mg by mouth at bedtime. 2 tablets every hs  . ranolazine (RANEXA) 1000 MG SR tablet Take 1 tablet (1,000 mg total) by mouth 2 (two) times daily.  . Vilazodone HCl (VIIBRYD) 10 MG TABS Take 10 mg by mouth daily.  . [DISCONTINUED] fluticasone (FLONASE) 50 MCG/ACT nasal spray Place 2 sprays into both nostrils as needed for allergies or rhinitis.  . [DISCONTINUED] methocarbamol (ROBAXIN) 500 MG tablet Take 500 mg by mouth as needed for muscle spasms.    Review of Systems  Constitutional: Positive for fatigue.  HENT: Negative.   Eyes: Negative.   Respiratory: Negative.   Cardiovascular: Negative.   Gastrointestinal: Negative.   Endocrine: Negative.   Musculoskeletal: Positive for back pain.  Skin: Negative.   Allergic/Immunologic: Negative.   Neurological: Negative.   Hematological: Negative.   Psychiatric/Behavioral: Positive for dysphoric mood. The patient is nervous/anxious.   All other systems reviewed and are negative.   BP 122/90  Pulse 98  Ht 5\' 3"  (1.6 m)  Wt 162 lb 12 oz (73.823 kg)  BMI 28.84 kg/m2  Physical Exam  Nursing note and vitals reviewed. Constitutional: She is oriented to person, place, and time. She appears well-developed and well-nourished.  HENT:   Head: Normocephalic.  Nose: Nose normal.  Mouth/Throat: Oropharynx is clear and moist.  Eyes: Conjunctivae are normal. Pupils are equal, round, and reactive to light.  Neck: Normal range of motion. Neck supple. No JVD present.  Cardiovascular: Normal rate, regular rhythm, S1 normal, S2 normal, normal heart sounds and intact distal pulses.  Exam reveals no gallop and no friction rub.   No murmur heard. Well-healed mediastinal scar  Pulmonary/Chest: Effort normal and breath sounds normal. No respiratory distress. She has no wheezes. She has no rales. She exhibits no tenderness.  Abdominal: Soft. Bowel sounds are normal. She exhibits no distension. There is no tenderness.  Musculoskeletal: Normal range of motion. She exhibits no edema and no tenderness.  Lymphadenopathy:    She has no cervical adenopathy.  Neurological: She is alert and oriented to person, place, and time. Coordination normal.  Skin: Skin is warm and dry. No rash noted. No erythema.  Psychiatric: She has a normal mood and affect. Her behavior is normal. Judgment and  thought content normal.    Assessment and Plan

## 2013-12-17 ENCOUNTER — Ambulatory Visit: Payer: Self-pay | Admitting: Pain Medicine

## 2014-01-14 ENCOUNTER — Ambulatory Visit: Payer: Self-pay | Admitting: Pain Medicine

## 2014-03-14 ENCOUNTER — Ambulatory Visit: Payer: Self-pay | Admitting: Pain Medicine

## 2014-03-17 ENCOUNTER — Ambulatory Visit: Payer: Self-pay | Admitting: Pain Medicine

## 2014-04-19 DIAGNOSIS — E782 Mixed hyperlipidemia: Secondary | ICD-10-CM | POA: Insufficient documentation

## 2014-05-11 ENCOUNTER — Ambulatory Visit: Payer: Self-pay | Admitting: Pain Medicine

## 2014-05-24 ENCOUNTER — Ambulatory Visit: Payer: Self-pay | Admitting: Pain Medicine

## 2014-05-31 ENCOUNTER — Ambulatory Visit: Payer: Self-pay | Admitting: Pain Medicine

## 2014-06-20 ENCOUNTER — Ambulatory Visit: Payer: Self-pay | Admitting: Pain Medicine

## 2014-06-28 ENCOUNTER — Ambulatory Visit: Payer: Self-pay | Admitting: Pain Medicine

## 2014-07-22 ENCOUNTER — Encounter: Payer: Self-pay | Admitting: Cardiovascular Disease

## 2014-07-22 ENCOUNTER — Ambulatory Visit (INDEPENDENT_AMBULATORY_CARE_PROVIDER_SITE_OTHER): Admitting: Cardiovascular Disease

## 2014-07-22 VITALS — BP 163/104 | HR 79 | Ht 63.0 in | Wt 155.5 lb

## 2014-07-22 DIAGNOSIS — E785 Hyperlipidemia, unspecified: Secondary | ICD-10-CM

## 2014-07-22 DIAGNOSIS — E1159 Type 2 diabetes mellitus with other circulatory complications: Secondary | ICD-10-CM

## 2014-07-22 DIAGNOSIS — M79602 Pain in left arm: Secondary | ICD-10-CM

## 2014-07-22 DIAGNOSIS — M79609 Pain in unspecified limb: Secondary | ICD-10-CM

## 2014-07-22 DIAGNOSIS — F329 Major depressive disorder, single episode, unspecified: Secondary | ICD-10-CM

## 2014-07-22 DIAGNOSIS — F32A Depression, unspecified: Secondary | ICD-10-CM

## 2014-07-22 DIAGNOSIS — F341 Dysthymic disorder: Secondary | ICD-10-CM

## 2014-07-22 DIAGNOSIS — I2510 Atherosclerotic heart disease of native coronary artery without angina pectoris: Secondary | ICD-10-CM

## 2014-07-22 DIAGNOSIS — R0600 Dyspnea, unspecified: Secondary | ICD-10-CM

## 2014-07-22 DIAGNOSIS — I10 Essential (primary) hypertension: Secondary | ICD-10-CM

## 2014-07-22 DIAGNOSIS — F419 Anxiety disorder, unspecified: Secondary | ICD-10-CM

## 2014-07-22 DIAGNOSIS — R0989 Other specified symptoms and signs involving the circulatory and respiratory systems: Secondary | ICD-10-CM

## 2014-07-22 DIAGNOSIS — R0609 Other forms of dyspnea: Secondary | ICD-10-CM

## 2014-07-22 DIAGNOSIS — R079 Chest pain, unspecified: Secondary | ICD-10-CM

## 2014-07-22 MED ORDER — ATORVASTATIN CALCIUM 40 MG PO TABS: 40.0000 mg | ORAL_TABLET | Freq: Every day | ORAL | Status: DC

## 2014-07-22 MED ORDER — ISOSORBIDE MONONITRATE ER 30 MG PO TB24: 30.0000 mg | ORAL_TABLET | Freq: Every day | ORAL | Status: DC

## 2014-07-22 MED ORDER — LISINOPRIL 20 MG PO TABS: 20.0000 mg | ORAL_TABLET | Freq: Two times a day (BID) | ORAL | Status: DC

## 2014-07-22 NOTE — Assessment & Plan Note (Signed)
She does have chronic shortness of breath and chest pain. I suspect her symptoms stem largely from anxiety. Stress test ordered to rule out ischemia

## 2014-07-22 NOTE — Assessment & Plan Note (Signed)
Anxiety and depression seems to be a major issue again. She does see psychiatry. She reports that her counselor is leaving

## 2014-07-22 NOTE — Progress Notes (Signed)
Patient ID: Jennifer [name], female    DOB: 10-02-1956, 58 y.o.   MRN: [id number]  HPI Comments: 58 year old woman with a history of coronary artery disease, PTCA of the LAD with 2.5 x 28 mm Cypher stent in January 2007, Repeat catheterization August 2011 with Stent placement of the mid LAD for 90% lesion,  catheterization August 06, 2011 for chest pain that showed severe mid LAD in-stent restenosis at the site of the previous drug-eluting stent ( DES stent x2 placed in the LAD in 2007 and 2011), also with severe mid RCA disease, moderate to severe proximal diagonal #1 disease, ejection fraction 55%. She presents today after recent hospitalization at [facility] on 12/09/2013  bypass surgery at [place] which was performed on August 12 2011 by Dr. Roxan Hockey. She had bypass graft x3 with a LIMA to the LAD, vein graft to the RCA, vein graft to the diagonal.  Last catheterization June 03, 2013 Diabetes typically poorly controlled   problems with Chronic anxiety, stress at home, confusion at times. Treated by a psychiatrist in [place], started on Valium.  She is tired all time, sleeping a good number of hours per day. In the past she has taken benzodiazepines,  Prozac.  previously on Lamictal and Cymbalta,  She sees Dr. Eliberto Ivory, also sees Dr. Leilani Merl in [place]. She does travel up to [place] to help family .    In followup today, she is very anxious, he reports having significant stress at home. She has chronic headaches, reports that her blood pressure has been running high. Having lots of chest pain, shortness of breath with exertion. Not doing any regular exercise. She's having pain in her back, arms. She reports having shots through the pain clinic.  "I am certain something is going on with my heart". Uncertain why she is not taking her isosorbide. She was afraid of taking this  Previous visit and was having worsening chest pain, fatigue. Symptoms concerning for angina. She underwent a cardiac  catheterization 06/03/2013. This showed 100% occluded mid LAD at the site of a stent, mild mid RCA disease, saphenous vein graft to the diagonal and LIMA graft to the LAD are patent. Occluded saphenous vein graft to the distal RCA. Medical management was recommended.   She does not do any exercise, husband reports that she snacks all day. She's not active. She continues to have chronic back pain. Problems with depression. Room in followup today, she presents with her son. She was admitted to the hospital February 11 and discharged 12/09/2013. She was kept overnight for symptoms of severe hypertension on arrival in the setting of general malaise, nausea vomiting felt secondary to viral illness and low potassium. Etiology of her symptoms was unclear though concerning for viral etiology. Unable to exclude anxiety component she reports that she was given Xanax by her psychiatrist. CT scan of the head in the hospital there were 11 2015 showed no abnormalities. Her son who presents with her today has several questions on how to manage her labile blood pressure. Sometimes it seems to go up to 180, typically in the setting of stress or anxiety she has not been taking isosorbide.   July 08 2012  right-sided chest pain. She ruled out for myocardial infarction by cardiac enzymes.  nuclear stress test which showed no evidence of ischemia with an ejection fraction of 44%. She was noted to be hypertensive with systolic blood pressure of 180.   EKG today shows normal sinus rhythm with rate 79 beats per  minute with no significant ST or T wave changes   Outpatient Encounter Prescriptions as of 07/22/2014  Medication Sig  . albuterol-ipratropium (COMBIVENT) 18-103 MCG/ACT inhaler Inhale into the lungs as needed for wheezing or shortness of breath.  . ALPRAZolam (XANAX) 1 MG tablet Take 1 mg by mouth at bedtime as needed for anxiety.  Marland Kitchen aspirin 325 MG tablet Take 325 mg by mouth daily.    Marland Kitchen atorvastatin (LIPITOR)  40 MG tablet Take 40 mg by mouth daily.  . BuPROPion HCl (WELLBUTRIN PO) Take 75 mg by mouth daily.   . carvedilol (COREG) 12.5 MG tablet Take 1 tablet (12.5 mg total) by mouth 2 (two) times daily with a meal.  . Cholecalciferol (VITAMIN D) 2000 UNITS tablet Take 2,000 Units by mouth daily.  . fenofibrate micronized (LOFIBRA) 134 MG capsule Take 134 mg by mouth daily before breakfast.  . Hydrocodone-Acetaminophen 10-325 MG/15ML SOLN Take by mouth as needed.  . Insulin Isophane & Regular (HUMULIN 70/30 PEN [place]) Inject 27 Units into the skin 2 (two) times daily.   . isosorbide mononitrate (IMDUR) 60 MG 24 hr tablet Take 60 mg by mouth as needed.  Marland Kitchen lisinopril (PRINIVIL,ZESTRIL) 40 MG tablet Take 1 tablet (40 mg total) by mouth daily.  . Magnesium 400 MG CAPS Take 400 mg by mouth daily.  . metFORMIN (GLUCOPHAGE) 500 MG tablet Take 1,000 mg by mouth 2 (two) times daily with a meal.  . nitroGLYCERIN (NITROSTAT) 0.4 MG SL tablet Place 1 tablet (0.4 mg total) under the tongue every 5 (five) minutes as needed for chest pain.  Marland Kitchen omeprazole (PRILOSEC) 20 MG capsule Take 20 mg by mouth daily.   . QUEtiapine (SEROQUEL XR) 300 MG 24 hr tablet Take 600 mg by mouth at bedtime. 2 tablets every hs  . ranolazine (RANEXA) 1000 MG SR tablet Take 1 tablet (1,000 mg total) by mouth 2 (two) times daily.  . Vilazodone HCl (VIIBRYD) 10 MG TABS Take 10 mg by mouth daily.    Review of Systems  Constitutional: Negative.   HENT: Negative.   Eyes: Negative.   Respiratory: Positive for shortness of breath.   Cardiovascular: Positive for chest pain.  Gastrointestinal: Negative.   Endocrine: Negative.   Musculoskeletal: Positive for back pain.  Skin: Negative.   Allergic/Immunologic: Negative.   Neurological: Negative.   Hematological: Negative.   Psychiatric/Behavioral: Positive for dysphoric mood. The patient is nervous/anxious.   All other systems reviewed and are negative.  BP 163/104  Pulse 79  Ht 5\' 3"  (1.6  m)  Wt 155 lb 8 oz (70.534 kg)  BMI 27.55 kg/m2  Physical Exam  Nursing note and vitals reviewed. Constitutional: She is oriented to person, place, and time. She appears well-developed and well-nourished.  HENT:  Head: Normocephalic.  Nose: Nose normal.  Mouth/Throat: Oropharynx is clear and moist.  Eyes: Conjunctivae are normal. Pupils are equal, round, and reactive to light.  Neck: Normal range of motion. Neck supple. No JVD present.  Cardiovascular: Normal rate, regular rhythm, S1 normal, S2 normal, normal heart sounds and intact distal pulses.  Exam reveals no gallop and no friction rub.   No murmur heard. Well-healed mediastinal scar  Pulmonary/Chest: Effort normal and breath sounds normal. No respiratory distress. She has no wheezes. She has no rales. She exhibits no tenderness.  Abdominal: Soft. Bowel sounds are normal. She exhibits no distension. There is no tenderness.  Musculoskeletal: Normal range of motion. She exhibits no edema and no tenderness.  Lymphadenopathy:  She has no cervical adenopathy.  Neurological: She is alert and oriented to person, place, and time. Coordination normal.  Skin: Skin is warm and dry. No rash noted. No erythema.  Psychiatric: She has a normal mood and affect. Her behavior is normal. Judgment and thought content normal.    Assessment and Plan

## 2014-07-22 NOTE — Assessment & Plan Note (Signed)
Encouraged her to stay on the Lipitor. Recent lipid panel available

## 2014-07-22 NOTE — Patient Instructions (Addendum)
You are doing well. Please restart isosorbide 30 mg up to twice a day Coreg twice a day Lisinopril twice a day  We will order a stress test  Please monitor your blood pressure  Please call us if you have new issues that need to be addressed before your next appt.  Your physician wants you to follow-up in: 6 months.  You will receive a reminder letter in the mail two months in advance. If you don't receive a letter, please call our office to schedule the follow-up appointment.  [place]  Your caregiver has ordered a Stress Test with nuclear imaging. The purpose of this test is to evaluate the blood supply to your heart muscle. This procedure is referred to as a "Non-Invasive Stress Test." This is because other than having an IV started in your vein, nothing is inserted or "invades" your body. Cardiac stress tests are done to find areas of poor blood flow to the heart by determining the extent of coronary artery disease (CAD). Some patients exercise on a treadmill, which naturally increases the blood flow to your heart, while others who are  unable to walk on a treadmill due to physical limitations have a pharmacologic/chemical stress agent called Lexiscan . This medicine will mimic walking on a treadmill by temporarily increasing your coronary blood flow.   Please note: these test may take anywhere between 2-4 hours to complete  PLEASE REPORT TO [place] AT THE FIRST DESK WILL DIRECT YOU WHERE TO GO  Date of Procedure:___Tuesday, Septbember 29___________  Arrival Time for Procedure:_______7:15am_________________  Instructions regarding medication:   _X_:  [place] the night before procedure and morning of procedure   PLEASE NOTIFY THE OFFICE AT LEAST 24 HOURS IN ADVANCE IF YOU ARE UNABLE TO KEEP YOUR APPOINTMENT.  [phone] AND  PLEASE NOTIFY NUCLEAR MEDICINE AT [facility] AT LEAST 24 HOURS IN ADVANCE IF YOU ARE UNABLE TO KEEP YOUR  APPOINTMENT. [phone]  How to prepare for your Myoview test:  1. Do not eat or drink after midnight 2. No caffeine for 24 hours prior to test 3. No smoking 24 hours prior to test. 4. Your medication may be taken with water.  If your doctor stopped a medication because of this test, do not take that medication. 5. Ladies, please do not wear dresses.  Skirts or pants are appropriate. Please wear a short sleeve shirt. 6. No perfume, cologne or lotion. 7. Wear comfortable walking shoes. No heels!

## 2014-07-22 NOTE — Assessment & Plan Note (Signed)
Blood pressure elevated today. Recommended she start isosorbide 30 mg daily. If tolerated, would increase to twice a day dosing if blood pressure continues to run high

## 2014-07-22 NOTE — Assessment & Plan Note (Signed)
Recurrent chest pain symptoms. We had to her mind her that she had a cardiac catheterization last year. She had forgot about this. Son presents with her today. Long discussion about various treatment options. We will schedule a stress test, Myoview given her symptoms of chest pain, shortness of breath. Uncertain if she would be a to treadmill but we will at least try. If needed could change to a pharmacologic study

## 2014-07-22 NOTE — Assessment & Plan Note (Signed)
Stress test has been ordered to rule out ischemia. Ongoing complaints of angina

## 2014-07-22 NOTE — Assessment & Plan Note (Signed)
We have encouraged continued exercise, careful diet management in an effort to lose weight. 

## 2014-07-26 ENCOUNTER — Ambulatory Visit: Payer: Self-pay | Admitting: Cardiovascular Disease

## 2014-07-26 DIAGNOSIS — R079 Chest pain, unspecified: Secondary | ICD-10-CM

## 2014-07-27 ENCOUNTER — Other Ambulatory Visit: Payer: Self-pay

## 2014-07-27 DIAGNOSIS — R079 Chest pain, unspecified: Secondary | ICD-10-CM

## 2014-07-27 DIAGNOSIS — M79602 Pain in left arm: Secondary | ICD-10-CM

## 2014-08-03 ENCOUNTER — Ambulatory Visit: Payer: Self-pay | Admitting: Pain Medicine

## 2014-08-13 ENCOUNTER — Emergency Department: Payer: Self-pay | Admitting: Student

## 2014-08-13 LAB — CBC WITH DIFFERENTIAL/PLATELET
BASOS PCT: 1.2 %
Basophil #: 0.1 10*3/uL (ref 0.0–0.1)
EOS PCT: 0.5 %
Eosinophil #: 0 10*3/uL (ref 0.0–0.7)
HCT: 41.7 % (ref 35.0–47.0)
HGB: 13.2 g/dL (ref 12.0–16.0)
Lymphocyte #: 1.8 10*3/uL (ref 1.0–3.6)
Lymphocyte %: 26.5 %
MCH: 28.4 pg (ref 26.0–34.0)
MCHC: 31.5 g/dL — ABNORMAL LOW (ref 32.0–36.0)
MCV: 90 fL (ref 80–100)
MONOS PCT: 5 %
Monocyte #: 0.3 x10 3/mm (ref 0.2–0.9)
NEUTROS PCT: 66.8 %
Neutrophil #: 4.5 10*3/uL (ref 1.4–6.5)
Platelet: 307 10*3/uL (ref 150–440)
RBC: 4.64 10*6/uL (ref 3.80–5.20)
RDW: 13.8 % (ref 11.5–14.5)
WBC: 6.8 10*3/uL (ref 3.6–11.0)

## 2014-08-13 LAB — LIPASE, BLOOD: LIPASE: 284 U/L (ref 73–393)

## 2014-08-13 LAB — BASIC METABOLIC PANEL
Anion Gap: 12 (ref 7–16)
BUN: 12 mg/dL (ref 7–18)
Calcium, Total: 8.9 mg/dL (ref 8.5–10.1)
Chloride: 101 mmol/L (ref 98–107)
Co2: 23 mmol/L (ref 21–32)
Creatinine: 0.78 mg/dL (ref 0.60–1.30)
EGFR (African American): >60
EGFR (Non-African Amer.): >60
GLUCOSE: 196 mg/dL — AB (ref 65–99)
Osmolality: 277 (ref 275–301)
Potassium: 3.6 mmol/L (ref 3.5–5.1)
Sodium: 136 mmol/L (ref 136–145)

## 2014-08-13 LAB — HCG, QUANTITATIVE, PREGNANCY: BETA HCG, QUANT.: 7 m[IU]/mL — AB

## 2014-08-13 LAB — TROPONIN I: Troponin-I: <0.02 ng/mL

## 2014-08-22 ENCOUNTER — Other Ambulatory Visit: Payer: Self-pay | Admitting: *Deleted

## 2014-08-22 MED ORDER — ISOSORBIDE MONONITRATE ER 60 MG PO TB24: 60.0000 mg | ORAL_TABLET | Freq: Every day | ORAL | Status: DC

## 2014-08-23 ENCOUNTER — Other Ambulatory Visit: Payer: Self-pay

## 2014-08-23 MED ORDER — ISOSORBIDE MONONITRATE ER 60 MG PO TB24: 60.0000 mg | ORAL_TABLET | Freq: Every day | ORAL | Status: DC

## 2014-08-23 NOTE — Telephone Encounter (Signed)
Refill sent for isosorbide 60 mg

## 2014-09-02 DIAGNOSIS — I10 Essential (primary) hypertension: Secondary | ICD-10-CM | POA: Insufficient documentation

## 2014-09-07 ENCOUNTER — Ambulatory Visit: Payer: Self-pay | Admitting: Pain Medicine

## 2014-11-23 ENCOUNTER — Other Ambulatory Visit: Payer: Self-pay | Admitting: *Deleted

## 2014-11-23 MED ORDER — LISINOPRIL 20 MG PO TABS: 20.0000 mg | ORAL_TABLET | Freq: Two times a day (BID) | ORAL | Status: DC

## 2014-11-23 MED ORDER — ISOSORBIDE MONONITRATE ER 60 MG PO TB24: 60.0000 mg | ORAL_TABLET | Freq: Every day | ORAL | Status: DC

## 2015-01-17 ENCOUNTER — Ambulatory Visit: Payer: Self-pay | Admitting: Pain Medicine

## 2015-01-30 ENCOUNTER — Ambulatory Visit
Admit: 2015-01-30 | Disposition: A | Discharge status: Home / Self Care | Payer: Self-pay | Attending: Pain Medicine | Admitting: Pain Medicine

## 2015-02-14 NOTE — Consult Note (Signed)
General Aspect 59 year old Caucasian female with past medical history significant for coronary artery disease status post stents in 2011 and bypass surgery in October 2012 at [facility], history of hypertension, and diabetes who presents the hospital complaining of ongoing chest pain for two days. Cardiology was consulted for chest and arm pain.    Over the last week she noticed a flicker of left-sided chest pain, but since two days ago was more constant. She reports having shoulder pain down to her arm, some chest pain. Her chest pain was present on arrival,  6 out of 10, left-sided, and radiating down the left arm. She had some nausea but no diaphoresis.   The patient was last admitted here in October 2012 at which time cardiac catheterization revealed in-stent stenosis of the LAD and also RCA stenosis. she was transferred over to [facility] and had bypass graft surgery at that time.    Present Illness . SOCIAL HISTORY: The patient lives at home with her husband. Her son and daughter-in-law also live with her. She quit smoking about two years ago. Prior to that she used to smoke two packs per day. She denies any alcohol use.   FAMILY HISTORY: Mom with heart disease and Dad died after a fall.   Physical Exam:   GEN well developed, well nourished, no acute distress    HEENT red conjunctivae    NECK supple  No masses    RESP normal resp effort  clear BS    CARD Regular rate and rhythm  No murmur    ABD denies tenderness  soft    EXTR negative cyanosis/clubbing, negative edema    SKIN normal to palpation    NEURO cranial nerves intact, motor/sensory function intact    PSYCH alert, A+O to time, place, person, good insight   Review of Systems:   Subjective/Chief Complaint arm pain, chest pain    General: No Complaints    Skin: No Complaints    ENT: No Complaints    Eyes: No Complaints    Neck: No Complaints    Respiratory: No Complaints    Cardiovascular: Chest pain or  discomfort    Gastrointestinal: No Complaints    Genitourinary: No Complaints    Vascular: No Complaints    Musculoskeletal: No Complaints    Neurologic: No Complaints    Hematologic: No Complaints    Endocrine: No Complaints    Psychiatric: No Complaints    Review of Systems: All other systems were reviewed and found to be negative    Medications/Allergies Reviewed Medications/Allergies reviewed     Suicide Attempt:    Migraines:    Depression:    DM type 2:    Hypertension:    Back pain/ pain clinic:    triple bipass: Oct 2012   Stent - Cardiac:    Translateral fusion L1-S1:    Breast implants/ removal:    Hysterectomy:        Admit Diagnosis:   ANGINA: 09-Jul-2012, Active, ANGINA      Admit Reason:   Angina: (413.9) Active, ICD9, Other and unspecified angina pectoris  Home Medications: Medication Instructions Status  acetaminophen-hydrocodone 325 mg-10 mg oral tablet 1 tab(s) orally 4 times a day, As Needed- for Pain  Active  aspirin 325 mg oral tablet 1 tab(s) orally once a day Active  carvedilol 12.5 mg oral tablet 1 tab(s) orally 2 times a day with meals Active  Cymbalta 60 mg oral delayed release capsule 2 cap(s) orally once  a day Active  diazepam 10 mg tablet 1 tab(s) orally 2 times a day, As Needed- for Anxiety, Nervousness  Active  Humulin 70/30 Pen subcutaneous suspension 27 unit(s) subcutaneous 2 times a day, As Needed per sliding scale Active  lamotrigine 200 mg oral tablet, extended release 1 tab(s) orally once a day Active  metformin 500 mg oral tablet 2 tab(s) orally 2 times a day Active  omeprazole 20 mg oral delayed release capsule 1 cap(s) orally 2 times a day Active  quetiapine 300 mg oral tablet 2 tab(s) orally once a day (at bedtime) Active  fenofibrate 134 mg oral capsule 1 cap(s) orally once a day Active  levothyroxine 25 mcg (0.025 mg) oral tablet 1 tab(s) orally once a day (in the morning) Active  Combivent 18 mcg-103  mcg-/inh inhalation aerosol 2 puff(s) inhaled every 4 hours, As Needed- for Shortness of Breath  Active  Os-Cal 500 + D oral tablet, chewable 1 tab(s) orally once a day Active  ferrous sulfate 325 mg (65 mg elemental iron) oral tablet 1 tab(s) orally once a day Active  estradiol 0.5 mg oral tablet 0.5 tab(s) vaginal once a day (at bedtime) Active   Lab Results:  Routine Chem:  12-Sep-13 06:38    BUN 15   Creatinine (comp) 0.81   Sodium, Serum 139   Potassium, Serum 4.3   Chloride, Serum 107   CO2, Serum 25   Calcium (Total), Serum 8.8   Anion Gap 7   Osmolality (calc) 281   eGFR (African American) >60   eGFR (Non-African American) >60 (eGFR values <7m/min/1.73 m2 may be an indication of chronic kidney disease (CKD). Calculated eGFR is useful in patients with stable renal function. The eGFR calculation will not be reliable in acutely ill patients when serum creatinine is changing rapidly. It is not useful in  patients on dialysis. The eGFR calculation may not be applicable to patients at the low and high extremes of body sizes, pregnant women, and vegetarians.)   Magnesium, Serum 1.8 (1.8-2.4 THERAPEUTIC RANGE: 4-7 mg/dL TOXIC: > 10 mg/dL  -----------------------)   Hemoglobin A1c ([facility])  8.9 (The American Diabetes Association recommends that a primary goal of therapy should be <7% and that physicians should reevaluate the treatment regimen in patients with HbA1c values consistently >8%.)   Cholesterol, Serum 200   Triglycerides, Serum  679   HDL (INHOUSE)  20   VLDL Cholesterol Calculated SEE COMMENT   LDL Cholesterol Calculated SEE COMMENT   Result Comment TRIGLYERIDES - Unable to report VLDL and LDL due to a  - Triglyceride value that is 400 mg/dL or   - greater.  Result(s) reported on 09 Jul 2012 at 07:13AM.  Cardiac:  11-Sep-13 22:47    CK, Total 46   CPK-MB, Serum  < 0.5 (Result(s) reported on 08 Jul 2012 at 11:22PM.)   Troponin I < 0.02 (0.00-0.05 0.05 ng/mL or  less: NEGATIVE  Repeat testing in 3-6 hrs  if clinically indicated. >0.05 ng/mL: POTENTIAL  MYOCARDIAL INJURY. Repeat  testing in 3-6 hrs if  clinically indicated. NOTE: An increase or decrease  of 30% or more on serial  testing suggests a  clinically important change)  12-Sep-13 06:38    CK, Total 34   CPK-MB, Serum  < 0.5 (Result(s) reported on 09 Jul 2012 at 07:14AM.)   Troponin I < 0.02 (0.00-0.05 0.05 ng/mL or less: NEGATIVE  Repeat testing in 3-6 hrs  if clinically indicated. >0.05 ng/mL: POTENTIAL  MYOCARDIAL INJURY. Repeat  testing in 3-6 hrs if  clinically indicated. NOTE: An increase or decrease  of 30% or more on serial  testing suggests a  clinically important change)  Routine Hem:  12-Sep-13 06:38    WBC (CBC) 4.2   RBC (CBC) 4.42   Hemoglobin (CBC) 13.0   Hematocrit (CBC) 39.5   Platelet Count (CBC) 236   MCV 89   MCH 29.4   MCHC 32.9   RDW 14.3   Neutrophil % 40.1   Lymphocyte % 48.3   Monocyte % 6.8   Eosinophil % 3.8   Basophil % 1.0   Neutrophil # 1.7   Lymphocyte # 2.0   Monocyte # 0.3   Eosinophil # 0.2   Basophil # 0.0 (Result(s) reported on 09 Jul 2012 at 06:51AM.)   EKG:   Interpretation nsr with rate 88 bpm, no significant ST or T wave changes   Radiology Results: XRay:    11-Sep-13 15:12, Chest PA and Lateral   Chest PA and Lateral    REASON FOR EXAM:    pain  COMMENTS:   May transport without cardiac monitor    PROCEDURE: DXR - DXR CHEST PA (OR AP) AND LATERAL  - Jul 08 2012  3:12PM     RESULT: Lungs clear. Prior CABG. Heart size normal.    IMPRESSION:  No acute abnormality.          Verified By: Osa Craver, M.D., MD    No Known Allergies:   Vital Signs/Nurse's Notes: **Vital Signs.:   12-Sep-13 08:01   Temperature Temperature (F) 97.9   Celsius 36.6   Pulse Pulse 75   Respirations Respirations 20   Systolic BP Systolic BP 712   Diastolic BP (mmHg) Diastolic BP (mmHg) 81   Mean BP 97   Pulse Ox % Pulse  Ox % 94   Oxygen Delivery Room Air/ 21 %     Impression 59 year old Caucasian female with past medical history significant for coronary artery disease status post stents in 2011 and bypass surgery in October 2012 at [facility], history of hypertension, and diabetes (porly controled), hyperlipidemia, who presents the hospital complaining of ongoing chest pain for two days. Cardiology was consulted for chest and arm pain.   1) Chest ain: Normal EKG, negative cardiac enz Wil schedule for a stress test given known disease. Lovenox given. Continue outpt meds.aspirin, coreg, statin (thinks she is on lipitor)  2)diabetes poorly controled "out of insulin", medication noncomplance.  "I have good insurance" Needs follow up with endocrine, hgA1C 8.9 Goal <6.5  3) hyperlipidemia med noncomplace not taking statin Would start lipitor 20 mg daily, will increase as an out  4) CAD, CABG: stress test today needs better management, medication compliance poor follow up in our clinic   Electronic Signatures: Ida Rogue (MD)  (Signed 12-Sep-13 10:29)  Authored: General Aspect/Present Illness, History and Physical Exam, Review of System, Past Medical History, Health Issues, Home Medications, Labs, EKG , Radiology, Allergies, Vital Signs/Nurse's Notes, Impression/Plan   Last Updated: 12-Sep-13 10:29 by Ida Rogue (MD)

## 2015-02-14 NOTE — Discharge Summary (Signed)
PATIENT NAME:  Jennifer [name], Jennifer [name] MR#:  [id number] DATE OF BIRTH:  [birth year]  DATE OF ADMISSION:  07/08/2012 DATE OF DISCHARGE:  07/09/2012  DISCHARGE DIAGNOSES: 1. Chest pain. 2. Hyperlipidemia. 3. Diabetes. 4. Hypertension. 5. Depression and anxiety. 6. History of coronary artery disease status post stents and coronary artery bypass graft. 7. Chronic low back pain.   DISPOSITION: The patient is being discharged home.   DISCHARGE FOLLOWUP: Follow up with Dr. Rockey Situ and PCP, Dr. Eliberto Ivory, in 1 to 2 weeks after discharge.   DIET: Low sodium, low fat, 1800 calorie ADA diet.   ACTIVITY: As tolerated.   DISCHARGE MEDICATIONS:  1. Lipitor 20 mg daily. 2. Estradiol 0.5 mg vaginally once a day at bedtime.  3. Ferrous sulfate 325 mg daily.  4. Os-Cal 500 plus vitamin D 1 tablet once a day. 5. Combivent 2 puffs every 4 hours p.r.n.  6. Synthroid 25 mcg daily.  7. Fenofibrate 134 mg daily. 8. Seroquel 300 mg two tablets once a day at bedtime.  9. Omeprazole 20 mg twice a day. 10. Metformin 500 mg 2 tablets twice a day. 11. Lamictal 200 mg once a day. 12. Humulin 70/30, 27 units subcutaneously twice a day as needed per sliding scale. 13. Diazepam 10 mg twice a day as needed for anxiety.  14. Cymbalta 60 mg 2 capsules once a day. 15. Coreg 12.5 mg twice a day.  16. Aspirin 325 mg daily.  17. Tylenol/hydrocodone 325/10 mg 1 tablet every 4 hours p.r.n. pain.   CONSULTANTS: Ida Rogue, MD - Cardiology.  RESULTS: Stress test showed no acute abnormalities. No evidence of ischemia. Ejection fraction 44%.   CBC normal. Hemoglobin A1c 8.9. Triglycerides 679. HDL 20. Cardiac enzymes negative. The rest of the CMP was normal.  HOSPITAL COURSE: The patient is a 59 year old female with past medical history of diabetes, hypertension, hyperlipidemia, and coronary artery disease status post coronary artery bypass graft who presented with chest pain. Given her history of coronary artery disease, she  was admitted to the hospital and serial cardiac enzymes were checked which were negative. She underwent an inpatient stress test which showed no evidence of ischemia. She has elevated triglycerides and low HDL and was advised about diet/lifestyle modifications and started on statin therapy. She also has uncontrolled diabetes with a Hemoglobin A1c of 8.9. She was advised about dietary and medication compliance. She was chest pain free and stable by the time of discharge.   TIME SPENT: 45 minutes.  ____________________________ Cherre Huger, MD sp:slb D: 07/09/2012 16:25:06 ET T: 07/10/2012 10:52:00 ET JOB#: [id number]  cc: Cherre Huger, MD, <Dictator> Dory Horn. Eliberto Ivory, MD Cherre Huger MD ELECTRONICALLY SIGNED 07/10/2012 14:44

## 2015-02-14 NOTE — H&P (Signed)
PATIENT NAME:  Jennifer [name], Jennifer [name] MR#:  [id number] DATE OF BIRTH:  [birth year]  DATE OF ADMISSION:  07/08/2012  ADMITTING PHYSICIAN: Gladstone Lighter, MD  PRIMARY CARE PHYSICIAN: Calla Kicks, MD  PRIMARY CARDIOLOGIST: Ida Rogue, MD  CHIEF COMPLAINT: Chest pain.   HISTORY OF PRESENT ILLNESS: Jennifer [name] is a 59 year old Caucasian female with past medical history significant for coronary artery disease status post stents in 2011 and bypass surgery in October 2012 at [facility], history of hypertension, and diabetes who presents the hospital complaining of ongoing chest pain for two days now. The patient was last admitted here in October 2012 at which time cardiac catheterization revealed in-stent stenosis of the LAD and also RCA stenosis so she was transferred over to [facility] and had bypass graft surgery at that time. The patient has been doing fine after the surgery and has not had recent stress test. She was supposed to be seen by Dr. Rockey Situ in his office a couple of months ago but she missed the appointment as she was helping her daughters in [place]. She says even after her surgery she continued to feel fatigued and also diaphoretic all the time. She is not sure if that is more hormonal changes going on, but she has not had chest pain up until lately. Over the last week she noticed a flicker of left-sided chest pain, but since yesterday was more constant. She could not bear it today and was concerned so she came to the hospital. Her chest pain is still present, about 6 out of 10, left-sided, and radiating down the left arm. She is nauseous but no diaphoresis other than baseline, so she is being admitted under observation for unstable angina.   PAST MEDICAL HISTORY:  1. Coronary artery disease status post stents in 2011, also coronary artery bypass graft surgery at [facility] in October 2012.  2. Insulin-dependent diabetes mellitus.  3. Hypertension.  4. Chronic low back pain.  5. Depression  and anxiety.    PAST SURGICAL HISTORY:  1. Coronary artery bypass graft surgery.  2. Cardiac stent placement.  3. Lower back surgery.  4. Breast implants and also removal surgery.  5. Hysterectomy.   ALLERGIES: No known drug allergies.   MEDICATIONS AT HOME:  1. Norco 10/325 mg one tablet p.o. every six hours p.r.n. for pain.  2. Aspirin 325 mg p.o. daily.  3. Coreg 12.5 mg p.o. twice a day. 4. Combivent Respimat four times a day.  5. Cymbalta 120 mg p.o. daily.  6. Diazepam 10 mg p.o. twice a day p.r.n. for anxiety. 7. Estradiol 0.25 mg daily. 8. Fenofibrate 145 mg p.o. daily.  9. Ferrous sulfate 325 mg p.o. daily.  10. Humulin 70/30, 27 units subcutaneous twice a day. 11. Lamictal 200 mg p.o. daily.  12. Levothyroxine 25 mcg p.o. daily. 13. Metformin 1000 mg p.o. twice a day. 14. Omeprazole 20 mg p.o. twice a day. 15. Os-Cal 500 mg plus vitamin D one tablet p.o. daily.  16. Quetiapine 600 mg p.o. daily.    SOCIAL HISTORY: The patient lives at home with her husband. Her son and daughter-in-law also live with her. She quit smoking about two years ago. Prior to that she used to smoke two packs per day. She denies any alcohol use.   FAMILY HISTORY: Mom with heart disease and Dad died after a fall.   REVIEW OF SYSTEMS: CONSTITUTIONAL: Positive for fatigue and also diaphoresis with sweating all the time. No fevers. EYES: Blurry vision and uses  reading glasses. No glaucoma or cataracts. Her left eye vision is more poor than the right eye. ENT: No tinnitus. Positive for decreased age-related hearing. No ear pain, epistaxis, discharge, or snoring.  RESPIRATORY: Positive for history of chronic obstructive pulmonary disease. No cough, wheeze, or hemoptysis. CARDIOVASCULAR: Positive for chest pain. No orthopnea, edema, arrhythmia, palpitations, or syncope. Positive for dyspnea on exertion occasionally. GI: Positive for nausea and diarrhea for a couple of days. No vomiting. No abdominal pain.  No hematemesis or melena. GENITOURINARY: No dysuria, hematuria, renal mass, frequency, or incontinence. ENDOCRINE: No polyuria, nocturia, thyroid problems, or heat or cold intolerance. HEMATOLOGY: No anemia, easy bruising, or bleeding. SKIN: No acne, rash, or lesions. MUSCULOSKELETAL: Positive for arthritis. NEUROLOGIC: No numbness, weakness, cerebrovascular accident, transient ischemic attack, or seizures. PSYCHOLOGIC: Positive for history of anxiety. No insomnia or depression.   PHYSICAL EXAMINATION:   VITAL SIGNS: Temperature 98.2 degrees Fahrenheit, pulse 89, respirations 20, blood pressure 187/104, and pulse oximetry 98% on room air.   GENERAL: Well built, well nourished female lying in bed, not in any acute distress.   HEENT: Normocephalic, atraumatic. Pupils are equal, round, and reacting to light. Anicteric sclerae. Extraocular movements intact. Oropharynx clear without erythema, mass, or exudates.   NECK: Supple. No thyromegaly, JVD, or carotid bruits. No lymphadenopathy. Normal range of motion.   LUNGS: Clear to auscultation bilaterally. No use of accessory muscles for breathing. No wheeze or crackles.   CARDIOVASCULAR: S1 and S2 regular rate and rhythm. No murmurs, rubs, or gallops. No chest wall tenderness.   ABDOMEN: Soft, nontender, and nondistended. No hepatosplenomegaly. Normal bowel sounds. There is a ventral hernia present.   EXTREMITIES: No pedal edema. No clubbing or cyanosis. 2+ dorsalis pedis pulses are palpable bilaterally.   SKIN: No acne, rash, or lesions.   LYMPH: No cervical or inguinal lymphadenopathy.   NEUROLOGIC: Cranial nerves intact. No focal motor or sensory deficits.   PSYCHOLOGICAL: The patient is awake, alert, and oriented x3.  LABORATORY, DIAGNOSTIC AND RADIOLOGIC DATA: WBC 6.0, hemoglobin 13.6, hematocrit 42.9, and platelet count 284.   Sodium 136, potassium 3.9, chloride 101, bicarbonate 24, BUN 17, creatinine 0.77, glucose 156, and calcium 10.2.  Troponin less than 0.02. CK 36 and CK-MB less than 0.5.   Chest x-ray is showing clear lung fields and prior bypass surgery evidence. No acute abnormality.   EKG is showing normal sinus rhythm with heart rate of 88. No acute ST-T wave abnormalities.   ASSESSMENT AND PLAN: This is a 59 year old female with history of coronary artery disease status post stents and recent bypass surgery in October 2012 at [facility] who comes in for ongoing chest pain for two days. Labs are normal. EKG is normal.  1. Unstable angina: We will admit to telemetry under observation and recycle cardiac enzymes. The case was already discussed with Dr. Rockey Situ. We will get a Myoview in the a.m. to begin with, but if the enzymes are elevated will need cardiac catheterization. Continue aspirin, Coreg, and nitroglycerin paste. We will check a lipid profile and statin if needed.  2. Hypertension: Continue home medications. She is on Coreg alone at this time. We will give IV hydralazine p.r.n. and add medications if blood pressure is elevated. She was on Micardis and Imdur in the past too. She is not sure who took her off of it.  3. Diabetes mellitus: Continue 70/30 insulin twice a day and sliding scale insulin. Hold metformin in case she needs cardiac catheterization. 4. Depression and anxiety: Continue  home medications of Cymbalta, Lamictal, Seroquel, and Valium p.r.n..  5. GI and DVT prophylaxis: On Prilosec twice a day and also Lovenox.           CODE STATUS: FULL CODE.   TIME SPENT ON ADMISSION: 50 minutes. ____________________________ Gladstone Lighter, MD rk:slb D: 07/08/2012 18:22:26 ET T: 07/09/2012 07:21:06 ET JOB#: [id number]  cc: Gladstone Lighter, MD, <Dictator> Dory Horn. Eliberto Ivory, MD Gladstone Lighter MD ELECTRONICALLY SIGNED 07/12/2012 7:16

## 2015-02-17 NOTE — Consult Note (Signed)
PATIENT NAME:  Jennifer [name], MOLE MR#:  [id number] DATE OF BIRTH:  [birth year]  DATE OF CONSULTATION:  09/18/2013  CONSULTING PHYSICIAN:  Noella Kipnis H. Posey Pronto, MD  PRIMARY CARE PROVIDER: Dr. Calla Kicks   CONSULT REQUESTED BY: Dr. Pat Patrick   REASON FOR CONSULT: Opinion regarding patient's coronary artery disease, diabetes, hypertension, anxiety, depression, hypothyroidism.   HISTORY OF PRESENT ILLNESS: The patient is a 59 year old white female with a history of coronary artery disease, with history of stents and bypass, who also has diabetes, insulin-dependent, hypertension, chronic low back pain, depression, anxiety, who is being admitted by Surgery for abdominal pain and distention. She is thought to have possible ileus, possible small bowel obstruction. The patient reports that a lot of her symptoms started today. She had a bowel movement yesterday, but none today. She started having a band-like sensation across her abdomen with distention and severe pain. Therefore, that is the reason she came to the ED. She has felt nauseous, but has not thrown up, has not had any blood in her stools, has not thrown up any blood. She denies any fevers, chills. No chest pains. No palpitations. Reports occasional shortness of breath. Denies any urinary symptoms.   PAST MEDICAL HISTORY: 1. Coronary artery disease, status post 2 stents in 2011. Subsequently had to have bypass surgery.  2. Insulin-dependent diabetes. 3. Hypertension. 4. Chronic low back pain.  5. Depression and anxiety.  6. According to her, von Willebrand's disease.  7. History of suicidal attempt in the past.  8. History of migraines.  9. Possible COPD. She is not 100% sure about that, but she has an inhaler.   PAST SURGICAL HISTORY: Coronary artery bypass graft, cardiac stents, lower back surgery, breast implants and then subsequent removal, status post hysterectomy.   ALLERGIES: None.   CURRENT MEDICATIONS: Acetaminophen hydrocodone 325/10, 1  tablet 4 times a day as needed. Vitamin D3, 2000 international units daily. Mag oxide 400, 1 tab p.o. b.i.d. Requip 0.25, 1 tabs p.o. at bedtime as needed for restless legs. Lipitor 20 daily. Levothyroxine 50 mcg daily. Aspirin 325 daily. Carvedilol 12.5, 1 tab p.o. b.i.d. Humulin 70/30, 10 units in the morning, 7 units in the evening. Metformin 1000 mg b.i.d. Omeprazole 20, 1 tab p.o. b.i.d. Quetiapine 300 mg 2 tabs at bedtime. Xanax 1.5 mg q. morning, then she takes 1.5 mg in the afternoon. Wellbutrin 150 daily. She is also on Viibryd 40 mg daily. She is on lisinopril amlodipine, she is not sure of the dose.  SOCIAL HISTORY:  She used to smoke, but quit. Now uses vapor cigarettes. No alcohol or drug use.   FAMILY HISTORY:  Mother had heart disease. Father died after a fall.   REVIEW OF SYSTEMS:   CONSTITUTIONAL:  Denies any fevers. Complains of some fatigue, weakness, abdominal pain. No weight loss. No weight gain.  EYES: No blurred or double vision. No pain. No redness. No inflammation. No glaucoma. No cataracts.  ENT: No tinnitus. No ear pain. No hearing loss. No seasonal or year-round allergies. No epistaxis. No snoring. No difficulty swallowing.  RESPIRATORY: Denies any cough, wheezing, hemoptysis. Questionable history of COPD. CARDIOVASCULAR:  Denies any chest pain, orthopnea, edema or arrhythmia. No palpitations. No syncope.  GASTROINTESTINAL:  Complains of nausea, but no vomiting. No diarrhea. Does have abdominal pain, diffuse. No hematemesis. No melena. No ulcer. Has history of GERD. No IBS. No jaundice. No rectal bleeding.  GENITOURINARY:  Denies any dysuria, hematuria, renal calculus or frequency.  ENDOCRINE: Denies any polyuria, nocturia.  Has hypothyroidism.  HEMATOLOGIC/LYMPHATIC:  Denies anemia, easy bruisability. Does report von Willebrand disease.  SKIN: Denies any acne or rash. No changes in mole, hair or skin.  MUSCULOSKELETAL:  Has chronic back pain.  NEUROLOGIC: No numbness. No  weakness. No TIA. No seizures. Does have memory loss,  PSYCHIATRIC: Has anxiety, depression, insomnia.   PHYSICAL EXAMINATION: VITAL SIGNS: Temperature 98.2, pulse 94, respirations 16, blood pressure 124/74, O2 is 96%.  GENERAL: The patient is a mildly obese female in no acute distress.  HEENT: Head atraumatic, normocephalic. Pupils equally round, react to light and accommodation. There is no conjunctival pallor. No scleral icterus. Nasal exam shows no drainage or ulceration. Oropharynx is clear, without any exudate.  NECK: Supple, without any JVD.  CARDIOVASCULAR: Regular rate and rhythm. No murmurs, rubs, clicks or gallops. PMI is not displaced.  ABDOMEN: Distended. Hyperactive bowel sounds. There is some tenderness in the epigastric region. No guarding. She also has abdominal hernia. There is no hepatosplenomegaly noted. Diminished bowel sounds.  SKIN: No rash.  LYMPHATICS:  No lymph nodes palpable.  VASCULAR:  Good DP, PT pulses.  PSYCHIATRIC:  Not anxious or depressed.  NEUROLOGIC:  Awake, alert, oriented x 3. No focal deficits.   EVALUATIONS: Glucose 224, BUN 18, creatinine 1.01, sodium 135, potassium 4.1, chloride 101, CO2 is 27, lipase 150. Calcium is slightly elevated at 10.3. Total protein 8.3, albumin 4.7, bili total 0.4, alk phos 73, AST is 30, ALT is 30.  CPK is 64, CK-MB is slightly elevated at 4.0. Troponin less than 0.02. WBC 14.6, hemoglobin 14.6, platelet count 326.   ASSESSMENT AND PLAN: The patient is a 59 year old white female, being admitted with small bowel obstruction/ileus.   1.  Coronary artery disease. The patient currently at n.p.o. Will hold aspirin until able to take p.o. Likely will not have much absorption through her GI tract as well, in light of her obstruction. If the plan is to continue to keep her n.p.o. for a prolonged time, then we will have to restart aspirin.   2.  Diabetes type 2. Will place patient on sliding scale 70/30 until taking p.o., since  patient will be n.p.o. Will monitor blood sugars. If they start increasing significantly, then will have to restart the 70/30.   3.  Hypertension. Will hold lisinopril and amlodipine for the time being. I will use p.r.n. hydralazine for systolic blood pressure greater than 170.  4.  Hypothyroidism, on low-dose Synthroid. Will hold this for the time being. Will check a TSH in light of her ileus/small bowel obstruction.    5.  Anxiety and depression. If patient needs something for anxiety, can use Ativan IV as needed.   6.  Gastroesophageal reflux disease. Recommend Protonix IV, as you are doing.  7.  Questionable history of COPD. Will do Combivent as needed.   8.  Miscellaneous: Recommend DVT prophylaxis with heparin or Lovenox, per Surgery.   Fifty-five minutes spent.    ____________________________ Lafonda Mosses. Posey Pronto, MD shp:mr D: 09/18/2013 19:50:13 ET T: 09/18/2013 20:55:14 ET JOB#: [id number]  cc: Cyndal Kasson H. Posey Pronto, MD, <Dictator> Alric Seton MD ELECTRONICALLY SIGNED 09/19/2013 21:00

## 2015-02-17 NOTE — Discharge Summary (Signed)
PATIENT NAME:  Jennifer [name], Jennifer [name] MR#:  [id number] DATE OF BIRTH:  Mar 18, 1956  DATE OF ADMISSION:  09/18/2013 DATE OF DISCHARGE:  09/21/2013  BRIEF HISTORY: The patient is a 59 year old woman admitted through the Emergency Room with severe abdominal pain and possible bowel obstruction. She was evaluated in the Emergency Room and found to have significantly dilated small bowel. CT scan demonstrated what appeared to be dilated small and large bowel, without obvious inflammatory change. There was not any obvious obstruction. Her only previous abdominal surgery was hysterectomy and an abdominoplasty. She has a complicated medical history, with multiple other medical problems. She has severe back pain for which she takes high-dose narcotics and has persistently been on medication for a number of years. Bowel function has been an issue for her for some time.   She was admitted to the hospital. Decompression was undertaken. The following day it was obvious that her CT contrast moved through to the colon; there was not any evidence of complete obstruction. Symptoms improved over the next several days on cathartics and enemas. She tolerated a regular diet by the 23rd, advanced to a regular diet that evening. She was able to pass some stool, and 3 small bowel movement, and her ileus symptoms had resolved. She was discharged home on the 25th, to be followed in the office in 7 to 10 days' time.   DISCHARGE MEDICATIONS: Include aspirin 325 once a day, carvedilol 12.5 mg b.i.d., Humulin 70/30, 27 units b.i.d., Fenofibrate 134 mg once a day, Combivent 18 mcg 2 puffs every four hours, Os-Cal 500, 1 tablet once a day, ferrous sulfate 325 once a day, Lipitor 20 mg once a day Requip 0.25 mg once a day, vitamin D two twice a week vitamin D3 once a day, acetaminophen hydrocodone 325/10, 4 times a day p.r.n., levothyroxine 50 mcg once a day, magnesium oxide 400 mg twice a day, Viivryd 20 mg b.i.d.   FINAL DISCHARGE DIAGNOSIS:   Colonic inertia with an ileus. No surgery was performed.    ____________________________ Micheline Maze, MD rle:cg D: 09/28/2013 00:40:48 ET T: 09/28/2013 01:12:23 ET JOB#: [id number]  cc: Micheline Maze, MD, <Dictator> Dory Horn. Eliberto Ivory, MD Rodena Goldmann MD ELECTRONICALLY SIGNED 09/28/2013 19:51

## 2015-02-17 NOTE — H&P (Signed)
PATIENT NAME:  Jennifer [name], Jennifer [name] MR#:  [id number] DATE OF BIRTH:  1956/06/05  DATE OF ADMISSION:  09/18/2013  PRIMARY CARE PHYSICIAN:  Dr. Calla Kicks.   ADMITTING PHYSICIAN: Dr. Pat Patrick.   CHIEF COMPLAINT: Abdominal pain and nausea.   BRIEF HISTORY: Jennifer [name] is a 59 year old woman seen in the Emergency Room with a 12-hour history of severe abdominal pain. The pain came on suddenly earlier this morning with a crampy colicky nature. It was "the worst pain I have ever had." She cannot get any relief. She became quite nauseated but did not vomit. She had a small bowel movement this morning, so passing some gas but less than normal. She presented to the Emergency Room for further evaluation. She has slightly elevated white blood cell count and a slightly elevated calcium level. Otherwise, her labs were unremarkable. Plain films revealed dilated small and large bowel. Contrasted CT scan revealed an ileus pattern without obvious inflammatory change in the abdomen. She does not carry history of significant abdominal problems in the past. She denies history of hepatitis, yellow jaundice, pancreatitis, peptic ulcer disease, gallbladder disease or diverticulitis. Only previous abdominal surgery was hysterectomy and abdominoplasty. She carries a diagnosis of abdominal wall hernia, although it has not been documented.   She has a complicated medical history. She has a history of von Willebrand's disease, type 2 diabetes, hypertension, severe coronary artery disease with a stent and coronary artery bypass. She has a long-standing low back pain treated with high-dose narcotics over the last several years by [facility]. She carries the diagnosis of irritable bowel syndrome. She does not have a gastroenterologist at the present time and has not had a colonoscopy in a number of years. She does see Dr. Calla Kicks in [place] as her primary care physician.   CURRENT MEDICATIONS: Include: 1.  Hydrocodone 325/10 mg 4  times a day p.r.n. 2.  Vitamin D 2000 once a day. 3.  Magnesium oxide 400 b.i.d. 4.  Requip 0.25 mg at bedtime. 5.  Lipitor 20 mg once a day. 6.  Levothyroxine 50 mcg once a day. 7.  Aspirin 325 once a day. 8.  Carvedilol 12.5 mg twice a day. 9.  Insulin fenofibrate 134 mg once a day. 10.  Combivent 18 mcg inhaler 2 puffs q. 4 hours. 11.  Calcium 500 mg once a day. 12.  Ferrous sulfate 325 once a day. 13.  Viibyrid 20 mg twice a day. 14.  Vitamin D 50,000 units 2 times a week.  She carries the diagnosis of chronic obstructive lung disease in addition and is a smoker currently using E-cigarettes. She does not drink alcohol.   FAMILY HISTORY: Otherwise noncontributory.  REVIEW OF SYSTEMS:  Unremarkable other than the symptoms noted above.   PHYSICAL EXAMINATION: GENERAL: She is alert, comfortable at the present time having had some IV pain medication. She has no further crampy abdominal pain. VITAL SIGNS:  Blood pressure is 144/98. Heart rate is 62 and regular. Oxygen saturation is 95% on room air.  HEENT: Reveals no scleral icterus. No pupillary abnormalities. No facial deformities.  NECK: Supple, nontender, midline trachea. No adenopathy.  CHEST: Clear, but she has very distant breath sounds. Normal pulmonary excursion.  CARDIAC: No murmurs or gallops to my ear. She seems to be in normal sinus rhythm.  ABDOMEN: Markedly distended, protuberant with hyperactive bowel sounds. She has no point tenderness, no rebound, no guarding. She is generally uncomfortable on abdominal palpation. I cannot palpate an abdominal hernia. She  does have a fairly significant diastasis.  LOWER EXTREMITIES:  Reveals no deformities. Full range of motion. Good distal pulses.  PSYCHIATRIC:  Reveals normal orientation, normal affect.   IMPRESSION:  I have independently reviewed her CT scan and her plain films. It certainly does not appear to have an obstructive pattern. 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 She has very little understanding or insight into her current problem. Because of her distended stomach pain that she is suffering, we will admit her to the hospital for pain control, nasogastric decompression and rehydration. We will ask the medicine doctor to assist with her multiple medical problems. We may consider gastroenterology consultation, consider a colonoscopy while we have her in the hospital. We will start with nasogastric suction and Fleet enemas. This plan has been discussed with the patient and her husband and they are in agreement.    ____________________________ Micheline Maze, MD rle:ce D: 09/18/2013 19:01:06 ET T: 09/18/2013 20:18:34 ET JOB#: [id number]  cc: Micheline Maze, MD, <Dictator> Dory Horn. Eliberto Ivory, MD Rodena Goldmann MD ELECTRONICALLY SIGNED 09/28/2013 0:34

## 2015-02-18 NOTE — Discharge Summary (Signed)
PATIENT NAME:  Jennifer [name], Jennifer [name] MR#:  [id number] DATE OF BIRTH:  17-Feb-1956  DATE OF ADMISSION:  12/08/2013 DATE OF DISCHARGE:  12/09/2013  DISCHARGE DIAGNOSES: 1.  Hypertensive urgency, now resolved. 2.  Nausea and vomiting, now resolved, likely due to viral illness. 3.  Hypokalemia, repleted and resolved.   SECONDARY DIAGNOSES: 1.  History of Von Willebrand disease. 2.  Anxiety and depression.  3.  Diabetes.  4.  Hypertension. 5.  Coronary artery disease, status post coronary artery bypass graft. 6.  Hypothyroidism.   CONSULTATIONS: None.   PROCEDURES AND RADIOLOGY:  CT scan of the head without contrast on 11th of February showed no acute intracranial abnormalities.   MAJOR LABORATORY PANEL: UA on admission was negative.   HISTORY AND SHORT HOSPITAL COURSE: The patient is a 59 year old female with the above-mentioned medical problems was admitted for hypertensive urgency, along with nausea, vomiting, diarrhea and was found to have hypokalemia. Please see Dr. Ardith Dark dictated history and physical for further details. The patient's blood pressure was improving while in the hospital. Her nausea and vomiting was also resolved. This was thought to be viral in nature as it was very transient and resolved on its own. She tolerated diet okay. She was also found to have hypokalemia, which was repleted and resolved. She was back to her baseline on 12th of February and was discharged home in stable condition.   PHYSICAL EXAMINATION: VITAL SIGNS: On the date of discharge: Temperature 98.3, heart rate 81 per minute, respirations 18 per minute, blood pressure 112/72. She was saturating 93% on room air.   PHYSICAL EXAMINATION:  On the day of discharge: CARDIOVASCULAR: S1, S2 normal. No murmurs, rubs or gallop.  LUNGS: Clear to auscultation bilaterally. No wheezing, rales, rhonchi or crepitation.  ABDOMEN: Soft, benign.  NEUROLOGIC: Nonfocal examination.  All other physical examination  remained at baseline.   DISCHARGE MEDICATIONS: 1.  Aspirin 325 mg p.o. daily.  2.  Coreg 12.5 mg p.o. b.i.d. 3.  Humulin 70/30 27 units subcutaneous b.i.d. 4.  Fenofibrate 134 mg p.o. daily.  5.  Combivent 2 puffs inhaled every 4 hours as needed.  6.  Os-Cal D once daily.  7.  Lipitor 20 mg p.o. at bedtime.  8.  Lisinopril 40 mg p.o. daily.  9.  Magnesium oxide 400 mg p.o. 1 to 2 times a day.  10.  Requip 0.25 mg p.o. at bedtime as needed.  11.  Vitamin D3 2000 international units once daily.  12.  Acetaminophen/hydrocodone 325/10 mg 1 tablet every 6 hours as needed.  13.  Levothyroxine 50 mcg p.o. daily. 14.  Viibryd 20 mg p.o. b.i.d. 15.  Bupropion 150 mg p.o. daily.  16.  Metformin 500 mg 2 tablets p.o. b.i.d. 17.  Nitrostat 0.4 mg sublingual every 5 minutes as needed. 18. Quetiapine 600 mg p.o. at bedtime. 19.  Zofran 4 mg every 8 hours as needed.   DISCHARGE DIET: Low sodium, 1800 ADA.   DISCHARGE ACTIVITY: As tolerated.  DISCHARGE INSTRUCTIONS AND FOLLOWUP: The patient was instructed to follow up with her [facility] primary care doctor, Dr. Calla Kicks in 1 to 2 weeks.   TOTAL TIME DISCHARGING THIS PATIENT: 55 minutes     ____________________________ Seferina Brokaw S. Manuella Ghazi, MD vss:ce D: 12/09/2013 16:16:35 ET T: 12/09/2013 19:41:58 ET JOB#: [id number]  cc: Audrina Marten S. Manuella Ghazi, MD, <Dictator> Dory Horn. Eliberto Ivory, [place] MD ELECTRONICALLY SIGNED 12/11/2013 12:24

## 2015-02-18 NOTE — H&P (Signed)
PATIENT NAME:  Jennifer [name], Jennifer [name] MR#:  [id number] DATE OF BIRTH:  20-Jul-1956  DATE OF ADMISSION:  12/08/2013  REFERRING PHYSICIAN: Dr. Benjaman Lobe.  PRIMARY CARE PHYSICIAN: Dr. Eliberto Ivory.   CHIEF COMPLAINT: Nausea, vomiting.   This is a 59 year old Caucasian female with past medical history of hypertension, coronary artery disease, status post CABG, presenting with nausea, vomiting. Describes 1-week duration of actual hypertension, noted blood pressure to be 200s/100s. Discussed this with her PCP and increased her medication of lisinopril from 20 mg to 40 without any effective change. Associated with her elevated blood pressure, she has had a headache for approximately 5 days in total duration, which is intermittent. Describes the location as top of her head, throbbing in quality, 6 to 7 out of 10 in intensity. No worsening or relieving factors. She has associated dizziness as well as generalized weakness and fatigue. She also has associated nausea, vomiting, diarrhea of 3 days total in duration with positive sick contacts. Denies any fevers or chills. The patient is also complaining of generalized weakness. Currently without complaints. The headache has been improved after receiving labetalol and fentanyl.   REVIEW OF SYSTEMS:   CONSTITUTIONAL: Denies fevers or chills, however, positive for fatigue and weakness.  EYES: Denies blurred vision, double vision, eye pain.  EARS, NOSE, THROAT: Denies tinnitus, ear pain, hearing loss.  RESPIRATORY: Denies cough, wheeze, shortness of breath.  CARDIOVASCULAR: Denies chest pain, palpitations, edema.  GASTROINTESTINAL: Positive for nausea, vomiting, diarrhea. Denies any abdominal pain.  GENITOURINARY: Denies dysuria, hematuria.  ENDOCRINE: Denies, nocturia or thyroid problems. HEMATOLOGIC AND LYMPHATIC: Denies easy bruising, bleeding.  SKIN: Denies rashes or lesions.  MUSCULOSKELETAL: Denies pain in neck, back, shoulder, knees, hips, or arthritic symptoms.   NEUROLOGIC: Denies paralysis, paresthesias. PSYCHIATRIC: Denies anxiety or depressive symptoms.   Otherwise, full review of systems performed by me is negative.   PAST MEDICAL HISTORY: Von Willebrand disease; depression and anxiety, not otherwise specified; type 2 diabetes, insulin requiring; hypertension; coronary artery disease, status post CABG; hypothyroidism.   SOCIAL HISTORY: Remote tobacco abuse, currently does use an e-cigarette. Denies any alcohol or drug use.   FAMILY HISTORY: Positive for coronary artery disease.   ALLERGIES: No known drug allergies.   HOME MEDICATIONS: Include acetaminophen/hydrocodone 325/10 mg 1 tab 4 times daily as needed for pain, aspirin 325 mg p.o. daily, lisinopril 40 mg p.o. daily, Nitrostat 0.4 mg sublingual every 5 minutes as needed for chest pain, Viibryd 20 mg p.o. b.i.d., Humulin 70/30 with 27 units subcutaneous b.i.d., metformin 500 mg 2 tabs p.o. b.i.d., fenofibrate 134 mg p.o. daily, Lipitor 20 mg p.o. at bedtime, Requip 0.25 mg 1 tab at bedtime as needed for restless legs, Coreg 12.5 mg p.o. b.i.d., Combivent 18/103 mcg inhalation 2 puffs every 4 hours as needed for shortness of breath, magnesium oxide 400 mg p.o. daily, bupropion 150 mg p.o. daily, levothyroxine 50 mcg p.o. daily, Os-Cal 500 plus vitamin D 1 tab p.o. daily, vitamin D3 at 2000 international units p.o. daily.   PHYSICAL EXAMINATION: VITAL SIGNS: Temperature 98, heart rate 104, respirations 20, blood pressure 198/111, currently 166/81, saturating 98% on room air. Weight 67.1 kg, BMI 26.2.  GENERAL: Well-nourished, well-developed Caucasian female, currently in no acute distress.  HEAD: Normocephalic, atraumatic.  EYES: Pupils equal, round, and reactive to light. Extraocular muscles intact. No scleral icterus.  MOUTH: Dry mucosal membranes. Poor dentition. No abscess noted.  EARS, NOSE, THROAT: Clear without exudates. No external lesions. NECK: Supple. No thyromegaly. No nodules. No  JVD. PULMONARY:  Clear to auscultation bilaterally without wheezes, rubs, or rhonchi. No use of accessory muscles. Good respiratory rate. Chest nontender to palpation.  CARDIOVASCULAR: S1, S2, regular rate and rhythm. No murmurs, rubs, or gallops. No edema. Pedal pulses 2+ bilaterally.  GASTROINTESTINAL: Soft, nontender, nondistended. No masses. Positive bowel sounds. No hepatosplenomegaly.  MUSCULOSKELETAL: No swelling, clubbing, edema. Range of motion full in all extremities.  NEUROLOGIC: Cranial nerves II through XII intact. No gross focal neurological deficits. Sensation intact. Reflexes intact.  SKIN: No ulcerations, lesions, rash, or cyanosis. Skin warm and dry. Turgor poor. PSYCHIATRIC: Mood and affect within normal limits. The patient is awake, alert and oriented x3. Insight and judgment intact.   LABORATORY DATA: CT head performed reveals no acute intracranial process. Sodium 136, potassium 3.4, chloride 97, bicarbonate 31, BUN 24, creatinine 0.88, glucose 240, total protein 9.1, albumin and remaining LFTs within normal limits. WBC 11.2, hemoglobin 16.5, platelets of 416.   ASSESSMENT AND PLAN: A 59 year old Caucasian female with history of hypertension, coronary artery disease, presenting with nausea and vomiting as well as hypertension.  1.  Hypertensive urgency: Restart her home medications, as she has been unable to tolerate them in the past few days. This includes Coreg and lisinopril. I will also add p.r.n. hydralazine 10 mg IV as needed for systolic blood  pressure greater than 397 or diastolic greater than 673. Currently, her blood pressure is markedly improved.  2.  Nausea, vomiting, and diarrhea: Supportive care. IV fluid hydration with normal saline. Provide Zofran as needed for nauseousness. Currently, she is not nauseous.  3.  Hypokalemia: Replace to goal potassium of 4 to 5. Also check a magnesium level, as she likely has hypomagnesemia given GI symptoms. 4.  Coronary artery  disease, status post coronary artery bypass grafting: Continue aspirin, beta blocker, ACE inhibitors, and statin therapy.  5.  Hypothyroidism: Continue with Synthroid.  6.  Venous thromboembolism prophylaxis with heparin subcutaneously.  7.  The patient is full code.   TIME SPENT: 45 minutes.    ____________________________ Aaron Mose. Dorothee Napierkowski, MD dkh:jcm D: 12/08/2013 21:03:15 ET T: 12/08/2013 22:29:11 ET JOB#: [id number]  cc: Aaron Mose. Joram Venson, MD, <Dictator> Garek Schuneman Woodfin Ganja MD ELECTRONICALLY SIGNED 12/09/2013 2:50

## 2015-02-22 ENCOUNTER — Ambulatory Visit
Admit: 2015-02-22 | Disposition: A | Discharge status: Home / Self Care | Payer: Self-pay | Attending: Pain Medicine | Admitting: Pain Medicine

## 2015-02-28 ENCOUNTER — Ambulatory Visit: Admitting: Pain Medicine

## 2015-03-01 DIAGNOSIS — F4321 Adjustment disorder with depressed mood: Secondary | ICD-10-CM | POA: Insufficient documentation

## 2015-03-01 DIAGNOSIS — E559 Vitamin D deficiency, unspecified: Secondary | ICD-10-CM | POA: Insufficient documentation

## 2015-03-01 DIAGNOSIS — F411 Generalized anxiety disorder: Secondary | ICD-10-CM | POA: Insufficient documentation

## 2015-03-01 DIAGNOSIS — F319 Bipolar disorder, unspecified: Secondary | ICD-10-CM

## 2015-03-01 DIAGNOSIS — Z8679 Personal history of other diseases of the circulatory system: Secondary | ICD-10-CM | POA: Insufficient documentation

## 2015-03-01 DIAGNOSIS — G4700 Insomnia, unspecified: Secondary | ICD-10-CM | POA: Insufficient documentation

## 2015-03-01 DIAGNOSIS — F419 Anxiety disorder, unspecified: Secondary | ICD-10-CM | POA: Insufficient documentation

## 2015-03-01 DIAGNOSIS — F3176 Bipolar disorder, in full remission, most recent episode depressed: Secondary | ICD-10-CM | POA: Insufficient documentation

## 2015-03-01 DIAGNOSIS — M797 Fibromyalgia: Secondary | ICD-10-CM | POA: Insufficient documentation

## 2015-03-01 DIAGNOSIS — Z8639 Personal history of other endocrine, nutritional and metabolic disease: Secondary | ICD-10-CM | POA: Insufficient documentation

## 2015-03-01 DIAGNOSIS — Z87898 Personal history of other specified conditions: Secondary | ICD-10-CM | POA: Insufficient documentation

## 2015-03-01 DIAGNOSIS — F3132 Bipolar disorder, current episode depressed, moderate: Secondary | ICD-10-CM | POA: Insufficient documentation

## 2015-03-05 DIAGNOSIS — M5137 Other intervertebral disc degeneration, lumbosacral region: Secondary | ICD-10-CM | POA: Insufficient documentation

## 2015-03-05 DIAGNOSIS — M5030 Other cervical disc degeneration, unspecified cervical region: Secondary | ICD-10-CM | POA: Insufficient documentation

## 2015-03-05 DIAGNOSIS — M5481 Occipital neuralgia: Secondary | ICD-10-CM | POA: Insufficient documentation

## 2015-03-05 DIAGNOSIS — M51379 Other intervertebral disc degeneration, lumbosacral region without mention of lumbar back pain or lower extremity pain: Secondary | ICD-10-CM | POA: Insufficient documentation

## 2015-03-05 NOTE — Patient Instructions (Addendum)
Continue present medications and take antibiotic.   F/U PCP for evaluation of BP and general medical condition.  F/U neurological evaluation.  .F/U surgical evaluation.   May consider radiofrequency rhizolysis, intraspinal implantation, and other procedures  Patient to call [facility] for any concerns prior to scheduled appointment.  Patient is to call [facility] Center should the patient have concerns prior to return appointmen Pain Management Discharge Instructions  General Discharge Instructions :  If you need to reach your doctor call: Monday-Friday 8:00 am - 4:00 pm at [phone] or toll free [phone].  After clinic hours [phone] to have operator reach doctor.  Bring all of your medication bottles to all your appointments in the pain clinic.  To cancel or reschedule your appointment with Pain Management please remember to call 24 hours in advance to avoid a fee.  Refer to the educational materials which you have been given on: General Risks, I had my Procedure. Discharge Instructions, Post Sedation.  Post Procedure Instructions:  The drugs you were given will stay in your system until tomorrow, so for the next 24 hours you should not drive, make any legal decisions or drink any alcoholic beverages.  You may eat anything you prefer, but it is better to start with liquids then soups and crackers, and gradually work up to solid foods.  Please notify your doctor immediately if you have any unusual bleeding, trouble breathing or pain that is not related to your normal pain.Pain Management Discharge Instructions  General Discharge Instructions :  If you need to reach your doctor call: Monday-Friday 8:00 am - 4:00 pm at [phone] or toll free [phone].  After clinic hours [phone] to have operator reach doctor.  Bring all of your medication bottles to all your appointments in the pain clinic.  To cancel or reschedule your appointment with  Pain Management please remember to call 24 hours in advance to avoid a fee.  Refer to the educational materials which you have been given on: General Risks, I had my Procedure. Discharge Instructions, Post Sedation.  Post Procedure Instructions:  The drugs you were given will stay in your system until tomorrow, so for the next 24 hours you should not drive, make any legal decisions or drink any alcoholic beverages.  You may eat anything you prefer, but it is better to start with liquids then soups and crackers, and gradually work up to solid foods.  Please notify your doctor immediately if you have any unusual bleeding, trouble breathing or pain that is not related to your normal pain.  Depending on the type of procedure that was done, some parts of your body may feel week and/or numb.  This usually clears up by tonight or the next day.  Walk with the use of an assistive device or accompanied by an adult for the 24 hours.  You may use ice on the affected area for the first 24 hours.  Put ice in a Ziploc bag and cover with a towel and place against area 15 minutes on 15 minutes off.  You may switch to heat after 24 hours.  Depending on the type of procedure that was done, some parts of your body may feel week and/or numb.  This usually clears up by tonight or the next day.  Walk with the use of an assistive device or accompanied by an adult for the 24 hours.  You may use ice on the affected area for the first 24 hours.  Put ice in a  Ziploc bag and cover with a towel and place against area 15 minutes on 15 minutes off.  You may switch to heat after 24 hours.

## 2015-03-06 ENCOUNTER — Ambulatory Visit: Attending: Pain Medicine | Admitting: Pain Medicine

## 2015-03-06 ENCOUNTER — Encounter: Payer: Self-pay | Admitting: Pain Medicine

## 2015-03-06 VITALS — BP 93/50 | HR 83 | Temp 98.7°F | Resp 16 | Ht 63.0 in | Wt 153.0 lb

## 2015-03-06 DIAGNOSIS — M79604 Pain in right leg: Secondary | ICD-10-CM | POA: Diagnosis present

## 2015-03-06 DIAGNOSIS — M5137 Other intervertebral disc degeneration, lumbosacral region: Secondary | ICD-10-CM | POA: Insufficient documentation

## 2015-03-06 DIAGNOSIS — M4806 Spinal stenosis, lumbar region: Secondary | ICD-10-CM | POA: Diagnosis not present

## 2015-03-06 DIAGNOSIS — M5126 Other intervertebral disc displacement, lumbar region: Secondary | ICD-10-CM | POA: Insufficient documentation

## 2015-03-06 DIAGNOSIS — M5481 Occipital neuralgia: Secondary | ICD-10-CM

## 2015-03-06 DIAGNOSIS — Z981 Arthrodesis status: Secondary | ICD-10-CM | POA: Diagnosis not present

## 2015-03-06 DIAGNOSIS — M5416 Radiculopathy, lumbar region: Secondary | ICD-10-CM

## 2015-03-06 DIAGNOSIS — M5030 Other cervical disc degeneration, unspecified cervical region: Secondary | ICD-10-CM

## 2015-03-06 DIAGNOSIS — M51379 Other intervertebral disc degeneration, lumbosacral region without mention of lumbar back pain or lower extremity pain: Secondary | ICD-10-CM

## 2015-03-06 DIAGNOSIS — M79605 Pain in left leg: Secondary | ICD-10-CM | POA: Diagnosis present

## 2015-03-06 MED ORDER — TRIAMCINOLONE ACETONIDE 40 MG/ML IJ SUSP
INTRAMUSCULAR | Status: AC
  Administered 2015-03-06: 10 mg
  Filled 2015-03-06: qty 1

## 2015-03-06 MED ORDER — ORPHENADRINE CITRATE 30 MG/ML IJ SOLN
INTRAMUSCULAR | Status: AC
  Filled 2015-03-06: qty 2

## 2015-03-06 MED ORDER — CEFAZOLIN SODIUM 1 G IJ SOLR
INTRAMUSCULAR | Status: AC
  Administered 2015-03-06: 1 g via INTRAVENOUS
  Filled 2015-03-06: qty 10

## 2015-03-06 MED ORDER — BUPIVACAINE HCL (PF) 0.25 % IJ SOLN
INTRAMUSCULAR | Status: AC
  Administered 2015-03-06: 20 mL
  Filled 2015-03-06: qty 30

## 2015-03-06 MED ORDER — MIDAZOLAM HCL 5 MG/5ML IJ SOLN
INTRAMUSCULAR | Status: AC
  Administered 2015-03-06: 3 mg via INTRAVENOUS
  Filled 2015-03-06: qty 5

## 2015-03-06 MED ORDER — CEFAZOLIN SODIUM 1-5 GM-% IV SOLN
1.0000 g | Freq: Once | INTRAVENOUS | Status: DC
  Filled 2015-03-06: qty 50

## 2015-03-06 MED ORDER — FENTANYL CITRATE (PF) 100 MCG/2ML IJ SOLN
INTRAMUSCULAR | Status: AC
  Administered 2015-03-06: 100 ug via INTRAVENOUS
  Filled 2015-03-06: qty 2

## 2015-03-06 NOTE — Progress Notes (Signed)
Patient is a 59 year old female returns to pain management Center for lower back and lower extremity pain. With pain radiating from the lumbar regions of the lower extremity region on the left as well as on the right. Pain becomes more severe with standing walking. We'll proceed with lumbosacral selective nerve root block in attempt to decrease severity of symptoms minimize the risk of medication escalation and hopefully retard progression of patient's symptoms. Patient's understanding and agreement with suggested treatment plan

## 2015-03-06 NOTE — Procedures (Signed)
PROCEDURE PERFORMED: Lumbosacral selective nerve root block   NOTE: The patient is a 59 y.o. year-old female who returns to [facility] for further evaluation and treatment of pain involving the lumbar and lower extremity region. 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 At L4-L5 there is mild broad-based disc bulge with moderate bilateral facet arthropathy and mild right foraminal stenosis. There is concern regarding intraspinal abnormalities contributing to patient's symptomatology we'll proceed with lumbosacral selective nerve root block felt to be medically necessary procedure in attempt to decrease severity of symptoms last progression of patient's symptoms as well as medication escalation. There is concern regarding intraspinal abnormalities contributing to the patient's symptomatology. The risks, benefits, and expectations of the procedure have been explained to the patient who was understanding and in agreement with suggested treatment plan. We will proceed with interventional treatment as discussed and as explained to the patient. The patient is understanding and in agreement with suggested treatment plan.   DESCRIPTION OF PROCEDURE: Lumbosacral selective nerve root block with IV Versed, IV fentanyl conscious sedation, EKG, blood pressure, pulse, and pulse oximetry monitoring. The procedure was performed with the patient in the prone position under fluoroscopic guidance. With the patient in the prone position, Betadine prep of proposed entry site was performed. Local anesthetic skin wheal of proposed needle entry site was prepared with 1.5% plain lidocaine  with AP view of the lumbosacral spine.   PROCEDURE #1: Needle placement at the L2 vertebral body: A 22-gauge needle was inserted at the inferior border of the transverse process of the vertebral body with needle placed medial to the midline of the transverse process on AP view of the lumbosacral spine.  PROCEDURE #2: Needle placement at the L3 vertebral body: A 22-gauge needle was inserted at the inferior border of the transverse process of the vertebral body with needle placed medial to the midline of the transverse process on AP view of the lumbosacral spine.    PROCEDURE #3: Needle placement at the L4 vertebral body: A22-gauge needle was inserted at the inferior border of the transverse process of the vertebral body with needle placed medial to the midline of the transverse process on AP view of the lumbosacral spine.  PROCEDURE #4: Needle placement at the L5 vertebral body. A 22-gauge needle was inserted in the inferior border of the transverse process of the vertebral body with needle placement medial to the midline of the transverse process on AP view of the lumbosacral spine.  Needle placement was then verified on lateral view at all levels with needle tip documented to be in the posterior superior quadrant of the intervertebral foramen of  L L2, L3, L4, and L5,.. Following negative aspiration for heme and CSF at each level, each level was injected with 3 mL of 0.25% bupivacaine with Kenalog. The patient tolerated the procedure well. A total of 10 mg of Kenalog was utilized for the procedure.   PLAN:  1. Medications: Will continue presently prescribed medications. 2. The patient is to undergo follow-up evaluation with Dr. Kym Groom for evaluation of blood pressure and general medical condition status post procedure performed on today's visit. 3. Surgical follow-up evaluation. 4. Neurological evaluation.  5. May consider radiofrequency procedures, implantation type procedures and other treatment  pending response to treatment and follow-up evaluation. 6. The patient has been advise do adhere to proper body mechanics and avoid activities which may aggravate condition. 7. The patient has been advised to call the Pain Management Center prior to scheduled return appointment should there be significant change in the patient's condition or should the patient have other concerns regarding condition prior to scheduled return appointment.

## 2015-03-06 NOTE — Progress Notes (Signed)
Discharge patient via wheelchair accompanied by son at  36  Hrs. Teach back 3 done

## 2015-03-06 NOTE — Progress Notes (Signed)
Nothing to eat or drink since last night. Driver available upon calling. No blood thinners.

## 2015-03-07 ENCOUNTER — Telehealth: Payer: Self-pay | Admitting: *Deleted

## 2015-03-07 NOTE — Telephone Encounter (Signed)
No problems post procedure. 

## 2015-03-23 ENCOUNTER — Encounter: Payer: Self-pay | Admitting: Pain Medicine

## 2015-03-23 ENCOUNTER — Ambulatory Visit: Attending: Pain Medicine | Admitting: Pain Medicine

## 2015-03-23 VITALS — BP 112/76 | HR 84 | Temp 98.5°F | Resp 16 | Ht 63.0 in | Wt 159.0 lb

## 2015-03-23 DIAGNOSIS — M5030 Other cervical disc degeneration, unspecified cervical region: Secondary | ICD-10-CM

## 2015-03-23 DIAGNOSIS — M1288 Other specific arthropathies, not elsewhere classified, other specified site: Secondary | ICD-10-CM | POA: Insufficient documentation

## 2015-03-23 DIAGNOSIS — M5116 Intervertebral disc disorders with radiculopathy, lumbar region: Secondary | ICD-10-CM | POA: Diagnosis not present

## 2015-03-23 DIAGNOSIS — M5481 Occipital neuralgia: Secondary | ICD-10-CM

## 2015-03-23 DIAGNOSIS — M79604 Pain in right leg: Secondary | ICD-10-CM | POA: Diagnosis present

## 2015-03-23 DIAGNOSIS — M533 Sacrococcygeal disorders, not elsewhere classified: Secondary | ICD-10-CM | POA: Diagnosis not present

## 2015-03-23 DIAGNOSIS — M545 Low back pain: Secondary | ICD-10-CM | POA: Diagnosis present

## 2015-03-23 DIAGNOSIS — M5136 Other intervertebral disc degeneration, lumbar region: Secondary | ICD-10-CM | POA: Diagnosis not present

## 2015-03-23 DIAGNOSIS — M797 Fibromyalgia: Secondary | ICD-10-CM

## 2015-03-23 DIAGNOSIS — M5137 Other intervertebral disc degeneration, lumbosacral region: Secondary | ICD-10-CM

## 2015-03-23 DIAGNOSIS — Z9889 Other specified postprocedural states: Secondary | ICD-10-CM | POA: Insufficient documentation

## 2015-03-23 DIAGNOSIS — M5416 Radiculopathy, lumbar region: Secondary | ICD-10-CM

## 2015-03-23 DIAGNOSIS — M79605 Pain in left leg: Secondary | ICD-10-CM | POA: Diagnosis present

## 2015-03-23 MED ORDER — HYDROCODONE-ACETAMINOPHEN 10-325 MG PO TABS: ORAL_TABLET | ORAL | Status: DC

## 2015-03-23 MED ORDER — PREGABALIN 50 MG PO CAPS: ORAL_CAPSULE | ORAL | Status: DC

## 2015-03-23 NOTE — Progress Notes (Signed)
Discharged to home ambulatory. Pre procedure instructions given. Scripts given as ordered. teachback 3 done.

## 2015-03-23 NOTE — Progress Notes (Signed)
   Subjective:    Patient ID: Jennifer [name], female    DOB: 12-09-1955, 59 y.o.   MRN: [id number]  HPI    Review of Systems     Objective:   Physical Exam        Assessment & Plan:

## 2015-03-23 NOTE — Patient Instructions (Addendum)
Continue present medications.   Lumbar epidural steroid injectioPain Management Discharge Instructions  General Discharge Instructions :  If you need to reach your doctor call: Monday-Friday 8:00 am - 4:00 pm at [phone] or toll free [phone].  After clinic hours [phone] to have operator reach doctor.  Bring all of your medication bottles to all your appointments in the pain clinic.  To cancel or reschedule your appointment with Pain Management please remember to call 24 hours in advance to avoid a fee.  Refer to the educational materials which you have been given on: General Risks, I had my Procedure. Discharge Instructions, Post Sedation.  Post Procedure Instructions:  The drugs you were given will stay in your system until tomorrow, so for the next 24 hours you should not drive, make any legal decisions or drink any alcoholic beverages.  You may eat anything you prefer, but it is better to start with liquids then soups and crackers, and gradually work up to solid foods.  Please notify your doctor immediately if you have any unusual bleeding, trouble breathing or pain that is not related to your normal pain.  Depending on the type of procedure that was done, some parts of your body may feel week and/or numb.  This usually clears up by tonight or the next day.  Walk with the use of an assistive device or accompanied by an adult for the 24 hours.  You may use ice on the affected area for the first 24 hours.  Put ice in a Ziploc bag and cover with a towel and place against area 15 minutes on 15 minutes off.  You may switch to heat after 24 hours.Epidural Steroid Injection Patient Information  Description: The epidural space surrounds the nerves as they exit the spinal cord.  In some patients, the nerves can be compressed and inflamed by a bulging disc or a tight spinal canal (spinal stenosis).  By injecting steroids into the epidural space, we can bring irritated nerves  into direct contact with a potentially helpful medication.  These steroids act directly on the irritated nerves and can reduce swelling and inflammation which often leads to decreased pain.  Epidural steroids may be injected anywhere along the spine and from the neck to the low back depending upon the location of your pain.   After numbing the skin with local anesthetic (like Novocaine), a small needle is passed into the epidural space slowly.  You may experience a sensation of pressure while this is being done.  The entire block usually last less than 10 minutes.  Conditions which may be treated by epidural steroids:   Low back and leg pain  Neck and arm pain  Spinal stenosis  Post-laminectomy syndrome  Herpes zoster (shingles) pain  Pain from compression fractures  Preparation for the injection:  1. Do not eat any solid food or dairy products within 6 hours of your appointment.  2. You may drink clear liquids up to 2 hours before appointment.  Clear liquids include water, black coffee, juice or soda.  No milk or cream please. 3. You may take your regular medication, including pain medications, with a sip of water before your appointment  Diabetics should hold regular insulin (if taken separately) and take 1/2 normal NPH dos the morning of the procedure.  Carry some sugar containing items with you to your appointment. 4. A driver must accompany you and be prepared to drive you home after your procedure.  5. Bring all your current medications with  your. 6. An IV may be inserted and sedation may be given at the discretion of the physician.   7. A blood pressure cuff, EKG and other monitors will often be applied during the procedure.  Some patients may need to have extra oxygen administered for a short period. 8. You will be asked to provide medical information, including your allergies, prior to the procedure.  We must know immediately if you are taking blood thinners (like  Coumadin/Warfarin)  Or if you are allergic to IV iodine contrast (dye). We must know if you could possible be pregnant.  Possible side-effects:  Bleeding from needle site  Infection (rare, may require surgery)  Nerve injury (rare)  Numbness & tingling (temporary)  Difficulty urinating (rare, temporary)  Spinal headache ( a headache worse with upright posture)  Light -headedness (temporary)  Pain at injection site (several days)  Decreased blood pressure (temporary)  Weakness in arm/leg (temporary)  Pressure sensation in back/neck (temporary)  Call if you experience:  Fever/chills associated with headache or increased back/neck pain.  Headache worsened by an upright position.  New onset weakness or numbness of an extremity below the injection site  Hives or difficulty breathing (go to the emergency room)  Inflammation or drainage at the infection site  Severe back/neck pain  Any new symptoms which are concerning to you  Please note:  Although the local anesthetic injected can often make your back or neck feel good for several hours after the injection, the pain will likely return.  It takes 3-7 days for steroids to work in the epidural space.  You may not notice any pain relief for at least that one week.  If effective, we will often do a series of three injections spaced 3-6 weeks apart to maximally decrease your pain.  After the initial series, we generally will wait several months before considering a repeat injection of the same type.  If you have any questions, please call [phone] [place]  What are the risk, side effects and possible complications? Generally speaking, most procedures are safe.  However, with any procedure there are risks, side effects, and the possibility of complications.  The risks and complications are dependent upon the sites that are lesioned, or the type of nerve  block to be performed.  The closer the procedure is to the spine, the more serious the risks are.  Great care is taken when placing the radio frequency needles, block needles or lesioning probes, but sometimes complications can occur. 1. Infection: Any time there is an injection through the skin, there is a risk of infection.  This is why sterile conditions are used for these blocks.  There are four possible types of infection. 1. Localized skin infection. 2. Central Nervous System Infection-This can be in the form of Meningitis, which can be deadly. 3. Epidural Infections-This can be in the form of an epidural abscess, which can cause pressure inside of the spine, causing compression of the spinal cord with subsequent paralysis. This would require an emergency surgery to decompress, and there are no guarantees that the patient would recover from the paralysis. 4. Discitis-This is an infection of the intervertebral discs.  It occurs in about 1% of discography procedures.  It is difficult to treat and it may lead to surgery.        2. Pain: the needles have to go through skin and soft tissues, will cause soreness.  3. Damage to internal structures:  The nerves to be lesioned may be near blood vessels or    other nerves which can be potentially damaged.       4. Bleeding: Bleeding is more common if the patient is taking blood thinners such as  aspirin, Coumadin, Ticiid, Plavix, etc., or if he/she have some genetic predisposition  such as hemophilia. Bleeding into the spinal canal can cause compression of the spinal  cord with subsequent paralysis.  This would require an emergency surgery to  decompress and there are no guarantees that the patient would recover from the  paralysis.       5. Pneumothorax:  Puncturing of a lung is a possibility, every time a needle is introduced in  the area of the chest or upper back.  Pneumothorax refers to free air around the  collapsed lung(s), inside of the thoracic  cavity (chest cavity).  Another two possible  complications related to a similar event would include: Hemothorax and Chylothorax.   These are variations of the Pneumothorax, where instead of air around the collapsed  lung(s), you may have blood or chyle, respectively.       6. Spinal headaches: They may occur with any procedures in the area of the spine.       7. Persistent CSF (Cerebro-Spinal Fluid) leakage: This is a rare problem, but may occur  with prolonged intrathecal or epidural catheters either due to the formation of a fistulous  track or a dural tear.       8. Nerve damage: By working so close to the spinal cord, there is always a possibility of  nerve damage, which could be as serious as a permanent spinal cord injury with  paralysis.       9. Death:  Although rare, severe deadly allergic reactions known as "Anaphylactic  reaction" can occur to any of the medications used.      10. Worsening of the symptoms:  We can always make thing worse.  What are the chances of something like this happening? Chances of any of this occuring are extremely low.  By statistics, you have more of a chance of getting killed in a motor vehicle accident: while driving to the hospital than any of the above occurring .  Nevertheless, you should be aware that they are possibilities.  In general, it is similar to taking a shower.  Everybody knows that you can slip, hit your head and get killed.  Does that mean that you should not shower again?  Nevertheless always keep in mind that statistics do not mean anything if you happen to be on the wrong side of them.  Even if a procedure has a 1 (one) in a 1,000,000 (million) chance of going wrong, it you happen to be that one..Also, keep in mind that by statistics, you have more of a chance of having something go wrong when taking medications.  Who should not have this procedure? If you are on a blood thinning medication (e.g. Coumadin, Plavix, see list of "Blood Thinners"),  or if you have an active infection going on, you should not have the procedure.  If you are taking any blood thinners, please inform your physician.  How should I prepare for this procedure?  Do not eat or drink anything at least six hours prior to the procedure.  Bring a driver with you .  It cannot be a taxi.  Come accompanied by an adult that can drive you back, and  that is strong enough to help you if your legs get weak or numb from the local anesthetic.  Take all of your medicines the morning of the procedure with just enough water to swallow them.  If you have diabetes, make sure that you are scheduled to have your procedure done first thing in the morning, whenever possible.  If you have diabetes, take only half of your insulin dose and notify our nurse that you have done so as soon as you arrive at the clinic.  If you are diabetic, but only take blood sugar pills (oral hypoglycemic), then do not take them on the morning of your procedure.  You may take them after you have had the procedure.  Do not take aspirin or any aspirin-containing medications, at least eleven (11) days prior to the procedure.  They may prolong bleeding.  Wear loose fitting clothing that may be easy to take off and that you would not mind if it got stained with Betadine or blood.  Do not wear any jewelry or perfume  Remove any nail coloring.  It will interfere with some of our monitoring equipment.  NOTE: Remember that this is not meant to be interpreted as a complete list of all possible complications.  Unforeseen problems may occur.  BLOOD THINNERS The following drugs contain aspirin or other products, which can cause increased bleeding during surgery and should not be taken for 2 weeks prior to and 1 week after surgery.  If you should need take something for relief of minor pain, you may take acetaminophen which is found in Tylenol,m Datril, Anacin-3 and Panadol. It is not blood thinner. The products  listed below are.  Do not take any of the products listed below in addition to any listed on your instruction sheet.  A.P.C or A.P.C with Codeine Codeine Phosphate Capsules #3 Ibuprofen Ridaura  ABC compound Congesprin Imuran rimadil  Advil Cope Indocin Robaxisal  Alka-Seltzer Effervescent Pain Reliever and Antacid Coricidin or Coricidin-D  Indomethacin Rufen  Alka-Seltzer plus Cold Medicine Cosprin Ketoprofen S-A-C Tablets  Anacin Analgesic Tablets or Capsules Coumadin Korlgesic Salflex  Anacin Extra Strength Analgesic tablets or capsules CP-2 Tablets Lanoril Salicylate  Anaprox Cuprimine Capsules Levenox Salocol  Anexsia-D Dalteparin Magan Salsalate  Anodynos Darvon compound Magnesium Salicylate Sine-off  Ansaid Dasin Capsules Magsal Sodium Salicylate  Anturane Depen Capsules Marnal Soma  APF Arthritis pain formula Dewitt's Pills Measurin Stanback  Argesic Dia-Gesic Meclofenamic Sulfinpyrazone  Arthritis Bayer Timed Release Aspirin Diclofenac Meclomen Sulindac  Arthritis pain formula Anacin Dicumarol Medipren Supac  Analgesic (Safety coated) Arthralgen Diffunasal Mefanamic Suprofen  Arthritis Strength Bufferin Dihydrocodeine Mepro Compound Suprol  Arthropan liquid Dopirydamole Methcarbomol with Aspirin Synalgos  ASA tablets/Enseals Disalcid Micrainin Tagament  Ascriptin Doan's Midol Talwin  Ascriptin A/D Dolene Mobidin Tanderil  Ascriptin Extra Strength Dolobid Moblgesic Ticlid  Ascriptin with Codeine Doloprin or Doloprin with Codeine Momentum Tolectin  Asperbuf Duoprin Mono-gesic Trendar  Aspergum Duradyne Motrin or Motrin IB Triminicin  Aspirin plain, buffered or enteric coated Durasal Myochrisine Trigesic  Aspirin Suppositories Easprin Nalfon Trillsate  Aspirin with Codeine Ecotrin Regular or Extra Strength Naprosyn Uracel  Atromid-S Efficin Naproxen Ursinus  Auranofin Capsules Elmiron Neocylate Vanquish  Axotal Emagrin Norgesic Verin  Azathioprine Empirin or Empirin with  Codeine Normiflo Vitamin E  Azolid Emprazil Nuprin Voltaren  Bayer Aspirin plain, buffered or children's or timed BC Tablets or powders Encaprin Orgaran Warfarin Sodium  Buff-a-Comp Enoxaparin Orudis Zorpin  Buff-a-Comp with Codeine Equegesic Os-Cal-Gesic   Buffaprin Excedrin plain, buffered  or Extra Strength Oxalid   Bufferin Arthritis Strength Feldene Oxphenbutazone   Bufferin plain or Extra Strength Feldene Capsules Oxycodone with Aspirin   Bufferin with Codeine Fenoprofen Fenoprofen Pabalate or Pabalate-SF   Buffets II Flogesic Panagesic   Buffinol plain or Extra Strength Florinal or Florinal with Codeine Panwarfarin   Buf-Tabs Flurbiprofen Penicillamine   Butalbital Compound Four-way cold tablets Penicillin   Butazolidin Fragmin Pepto-Bismol   Carbenicillin Geminisyn Percodan   Carna Arthritis Reliever Geopen Persantine   Carprofen Gold's salt Persistin   Chloramphenicol Goody's Phenylbutazone   Chloromycetin Haltrain Piroxlcam   Clmetidine heparin Plaquenil   Cllnoril Hyco-pap Ponstel   Clofibrate Hydroxy chloroquine Propoxyphen         Before stopping any of these medications, be sure to consult the physician who ordered them.  Some, such as Coumadin (Warfarin) are ordered to prevent or treat serious conditions such as "deep thrombosis", "pumonary embolisms", and other heart problems.  The amount of time that you may need off of the medication may also vary with the medication and the reason for which you were taking it.  If you are taking any of these medications, please make sure you notify your pain physician before you undergo any procedures.         n 04/10/2015   F/U PCP for evaliation of  BP and general medical  condition.  F/U surgical evaluation. We will reschedule neurosurgical evaluation at this time as discussed  F/U neurological evaluation.  May consider radiofrequency rhizolysis or intraspinal procedures pending response to present treatment and F/U  evaluation.  Patient to call Pain Management Center should patient have concerns prior to scheduled return appointment.

## 2015-03-23 NOTE — Progress Notes (Signed)
   Subjective:    Patient ID: Jennifer [name], female    DOB: [birth year], 59 y.o.   MRN: [id number]  HPI   patient is 59 year old female who returns to [facility] for further evaluation and treatment of pain involving the lower back lower extremity region. Patient states that her pain increases with standing walking and states that the lower extremities become weaker with prolonged standing and walking. Patient denies trauma change in events of daily living the cause change in symptomatology. We discussed patient's condition and will schedule patient for neurosurgical evaluation and proceed with lumbar epidural steroid injection at time return appointment. The patient was understanding and in agreement with suggested treatment plan.     Review of Systems     Objective:   Physical Exam   there was tenderness of the splenius capitis and occipitalis muscles of mild degree with mild tenderness over the region of the acromioclavicular and glenohumeral joint regions as well. Bilaterally equal grip strength with Tinel and Phalen's maneuver without increased pain of any significant degree. No crepitus of the thoracic region was noted  Palpation over the lumbar paraspinal musculature region lumbar facet region reproduced pain of moderate degree with lateral bending and rotation reproducing moderate discomfort as well. Raising was tolerates approximately 20 with decreased EHL strength with pressure decreased sensation along the L5 dermatomal distribution. Palpation of the lumbar facets reproduced moderately severe discomfort. Lateral bending and rotation associated with moderate discomfort. Palpation of the PSIS and PII S region reproduced moderate discomfort with mild tenderness of the greater trochanteric region. Negative clonus negative Homans abdomen nontende andr no costovertebral angle tenderness was noted      Assessment & Plan:     degenerative disc disease lumbar spine   L5-S1  posterior lumbar interbody fusion 10 x 12 mm right facet intraspinal synovial cyst, mass effect upon the right lateral thecal sac and mild medial displacement of the right intraspinal S1 nerve root as well as abutting the foraminal portion of the L5 nerve root. L4-L5 mild broad-based disc bulge with moderate bilateral facet arthropathy and mild right foraminal stenosis   lumbar radiculopathy   sacroiliac joint dysfunction   Degenerative disc disease cervical spine    plan  Continue present medications.  F/U PCP for evaliation of  BP and general medical  condition.  F/U surgical evaluation. Neurosurgical evaluation scheduled  F/U neurological evaluation  TENS unit prescribed for lumbar lower extremity pain and paresthesia  May consider radiofrequency rhizolysis or intraspinal procedures pending response to present treatment and F/U evaluation.  Patient to call Pain Management Center should patient have concerns prior to scheduled return appointment.

## 2015-04-10 ENCOUNTER — Encounter: Payer: Self-pay | Admitting: Pain Medicine

## 2015-04-10 ENCOUNTER — Ambulatory Visit: Attending: Pain Medicine | Admitting: Pain Medicine

## 2015-04-10 VITALS — BP 130/81 | HR 75 | Temp 98.6°F | Resp 16 | Ht 63.0 in | Wt 161.0 lb

## 2015-04-10 DIAGNOSIS — M47816 Spondylosis without myelopathy or radiculopathy, lumbar region: Secondary | ICD-10-CM | POA: Diagnosis not present

## 2015-04-10 DIAGNOSIS — M5126 Other intervertebral disc displacement, lumbar region: Secondary | ICD-10-CM | POA: Insufficient documentation

## 2015-04-10 DIAGNOSIS — M4806 Spinal stenosis, lumbar region: Secondary | ICD-10-CM | POA: Insufficient documentation

## 2015-04-10 DIAGNOSIS — M1288 Other specific arthropathies, not elsewhere classified, other specified site: Secondary | ICD-10-CM | POA: Insufficient documentation

## 2015-04-10 DIAGNOSIS — M5137 Other intervertebral disc degeneration, lumbosacral region: Secondary | ICD-10-CM

## 2015-04-10 DIAGNOSIS — M5481 Occipital neuralgia: Secondary | ICD-10-CM

## 2015-04-10 DIAGNOSIS — M797 Fibromyalgia: Secondary | ICD-10-CM

## 2015-04-10 DIAGNOSIS — M79605 Pain in left leg: Secondary | ICD-10-CM | POA: Diagnosis present

## 2015-04-10 DIAGNOSIS — M5030 Other cervical disc degeneration, unspecified cervical region: Secondary | ICD-10-CM

## 2015-04-10 DIAGNOSIS — M79604 Pain in right leg: Secondary | ICD-10-CM | POA: Diagnosis present

## 2015-04-10 DIAGNOSIS — M545 Low back pain: Secondary | ICD-10-CM | POA: Diagnosis present

## 2015-04-10 DIAGNOSIS — M5416 Radiculopathy, lumbar region: Secondary | ICD-10-CM

## 2015-04-10 MED ORDER — SODIUM CHLORIDE 0.9 % IJ SOLN
INTRAMUSCULAR | Status: AC
  Administered 2015-04-10: 4 mL
  Filled 2015-04-10: qty 20

## 2015-04-10 MED ORDER — LIDOCAINE HCL (PF) 1 % IJ SOLN
INTRAMUSCULAR | Status: AC
Start: 2015-04-10 — End: 2015-04-10
  Administered 2015-04-10: 3 mL
  Filled 2015-04-10: qty 5

## 2015-04-10 MED ORDER — BUPIVACAINE HCL (PF) 0.25 % IJ SOLN
INTRAMUSCULAR | Status: AC
  Administered 2015-04-10: 10 mL
  Filled 2015-04-10: qty 30

## 2015-04-10 MED ORDER — CEFAZOLIN SODIUM 1 G IJ SOLR
INTRAMUSCULAR | Status: AC
  Administered 2015-04-10: 1 g via INTRAVENOUS
  Filled 2015-04-10: qty 10

## 2015-04-10 MED ORDER — ORPHENADRINE CITRATE 30 MG/ML IJ SOLN
INTRAMUSCULAR | Status: AC
  Administered 2015-04-10: 60 mg
  Filled 2015-04-10: qty 2

## 2015-04-10 MED ORDER — MIDAZOLAM HCL 5 MG/5ML IJ SOLN
INTRAMUSCULAR | Status: AC
  Administered 2015-04-10: 3 mg via INTRAVENOUS
  Filled 2015-04-10: qty 5

## 2015-04-10 MED ORDER — CEFUROXIME AXETIL 250 MG PO TABS: 250.0000 mg | ORAL_TABLET | Freq: Two times a day (BID) | ORAL | Status: DC

## 2015-04-10 MED ORDER — FENTANYL CITRATE (PF) 100 MCG/2ML IJ SOLN
INTRAMUSCULAR | Status: AC
  Administered 2015-04-10: 50 ug via INTRAVENOUS
  Filled 2015-04-10: qty 2

## 2015-04-10 MED ORDER — TRIAMCINOLONE ACETONIDE 40 MG/ML IJ SUSP
INTRAMUSCULAR | Status: AC
  Administered 2015-04-10: 40 mg
  Filled 2015-04-10: qty 1

## 2015-04-10 NOTE — Progress Notes (Signed)
Subjective:    Patient ID: Jennifer [name], female    DOB: Apr 14, 1956, 59 y.o.   MRN: [id number]  HPI  PROCEDURE PERFORMED: Lumbar epidural steroid injection   NOTE: The patient is a 59 y.o. female who returns to [place] for further evaluation and treatment of pain involving the lumbar and lower extremity region.  MRI revealed the patient to be with  Degenerative changes of the lumbar spine with prior surgical intervention of the lumbar region, L5-S1 posterior lumbar interbody fusion, 10 x 12 mm right facet intraspinal synovial cyst, mass effect upon the right lateral thecal sac and mild medial displacement of the right intraspinal S1 nerve root as abutting the foraminal portion of the L5 nerve root. At L4-L5 there is mild broad-based disc bulge with moderate bilateral facet arthropathy and mild right foraminal stenosis.. The risks, benefits, and expectations of the procedure have been discussed and explained to the patient who was understanding and in agreement with suggested treatment plan. We will proceed with interventional treatment as discussed and explained to the patient who is willing to proceed with procedure as planned.   DESCRIPTION OF PROCEDURE: Lumbar epidural steroid injection with IV Versed, IV fentanyl conscious sedation, EKG, blood pressure, pulse, and pulse oximetry monitoring. The procedure was performed with the patient in the prone position under fluoroscopic guidance. A local anesthetic skin wheal of 1.5% plain lidocaine was accomplished at proposed entry site. An 18-gauge Tuohy epidural needle was inserted at the L  5 vertebral body level  right of the midline via loss-of-resistance technique with negative heme and negative CSF return. A total of 4 mL of Preservative-Free normal saline with 40 mg of Kenalog injected incrementally via epidurally placed needle. Needle removed.  The patient tolerated the injection well.   PLAN:   1. Medications: We will continue  presently prescribed medications. 2. Will consider modification of treatment regimen pending response to treatment rendered on today's visit and follow-up evaluation. 3. The patient is to follow-up with primary care physician regarding blood pressure and general medical condition status post lumbar epidural steroid injection performed on today's visit. 4. Surgical evaluation. Neurosurgical evaluation scheduled for evaluation of lower back and lower extremity pain and weakness 5. Neurological evaluation. 6. The patient may be a candidate for radiofrequency procedures, implantation device, and other treatment pending response to treatment and follow-up evaluation. 7. The patient has been advised to adhere to proper body mechanics and avoid activities which appear to aggravate condition. 8. The patient has been advised to call the Pain Management Center prior to scheduled return appointment should there be significant change in condition or should there be significant  1. Medications: We will continue presently prescribed medications.  2. Will consider modification of treatment regimen pending response to treatment rendered on today's visit and follow-up evaluation.  3. The patient is to follow-up with primary care physician regarding blood pressure and general medical condition status post lumbar epidural steroid injection performed on today's visit.  4. Surgical evaluation.  5. Neurological evaluation. 6. The patient may be a candidate for radiofrequency procedures, implantation device, and other treatment pending response to treatment and follow-up evaluation.  7. The patient has been advised to adhere to proper body mechanics and avoid activities which appear to aggravate condition.  8. The patient has been advised to call the Pain Management Center prior to scheduled return appointment should there be significant change in condition or should should patient have other concerns regarding condition  prior to scheduled  return appointment.  The patient is understanding and in agreement with suggested treatment plan.      Review of Systems     Objective:   Physical Exam        Assessment & Plan:

## 2015-04-10 NOTE — Progress Notes (Signed)
Safety precautions to be maintained throughout the outpatient stay will include: orient to surroundings, keep bed in low position, maintain call bell within reach at all times, provide assistance with transfer out of bed and ambulation.  Tolerated po fluids well.  

## 2015-04-10 NOTE — Patient Instructions (Addendum)
Continue present medications and antibiotic Ceftin as prescribed.  F/U PCP for evaliation of  BP and general medical  condition.  F/U surgical evaluation.  F/U neurological evaluation.  May consider radiofrequency rhizolysis or intraspinal procedures pending response to present treatment and F/U evaluation.  Patient to call [facility] should patient have concerns prior to scheduled return appointment.   Pain Management Discharge Instructions  General Discharge Instructions :  If you need to reach your doctor call: Monday-Friday 8:00 am - 4:00 pm at [phone] or toll free [phone].  After clinic hours [phone] to have operator reach doctor.  Bring all of your medication bottles to all your appointments in the pain clinic.  To cancel or reschedule your appointment with Pain Management please remember to call 24 hours in advance to avoid a fee.  Refer to the educational materials which you have been given on: General Risks, I had my Procedure. Discharge Instructions, Post Sedation.  Post Procedure Instructions:  The drugs you were given will stay in your system until tomorrow, so for the next 24 hours you should not drive, make any legal decisions or drink any alcoholic beverages.  You may eat anything you prefer, but it is better to start with liquids then soups and crackers, and gradually work up to solid foods.  Please notify your doctor immediately if you have any unusual bleeding, trouble breathing or pain that is not related to your normal pain.  Depending on the type of procedure that was done, some parts of your body may feel week and/or numb.  This usually clears up by tonight or the next day.  Walk with the use of an assistive device or accompanied by an adult for the 24 hours.  You may use ice on the affected area for the first 24 hours.  Put ice in a Ziploc bag and cover with a towel and place against area 15 minutes on 15 minutes off.  You may  switch to heat after 24 hours.  A prescription for CEFTIN was sent to your pharmacy and should be available for pickup today.

## 2015-04-11 ENCOUNTER — Telehealth: Payer: Self-pay | Admitting: *Deleted

## 2015-04-11 NOTE — Telephone Encounter (Signed)
Doing well. Denies complications.

## 2015-04-20 ENCOUNTER — Encounter: Payer: Self-pay | Admitting: Pain Medicine

## 2015-04-20 ENCOUNTER — Ambulatory Visit: Attending: Pain Medicine | Admitting: Pain Medicine

## 2015-04-20 VITALS — BP 110/69 | HR 88 | Temp 98.6°F | Resp 18 | Ht 63.0 in | Wt 159.0 lb

## 2015-04-20 DIAGNOSIS — M5136 Other intervertebral disc degeneration, lumbar region: Secondary | ICD-10-CM | POA: Insufficient documentation

## 2015-04-20 DIAGNOSIS — M4806 Spinal stenosis, lumbar region: Secondary | ICD-10-CM | POA: Insufficient documentation

## 2015-04-20 DIAGNOSIS — Z981 Arthrodesis status: Secondary | ICD-10-CM | POA: Insufficient documentation

## 2015-04-20 DIAGNOSIS — M5137 Other intervertebral disc degeneration, lumbosacral region: Secondary | ICD-10-CM

## 2015-04-20 DIAGNOSIS — M5481 Occipital neuralgia: Secondary | ICD-10-CM | POA: Diagnosis not present

## 2015-04-20 DIAGNOSIS — M5126 Other intervertebral disc displacement, lumbar region: Secondary | ICD-10-CM | POA: Diagnosis not present

## 2015-04-20 DIAGNOSIS — M961 Postlaminectomy syndrome, not elsewhere classified: Secondary | ICD-10-CM | POA: Insufficient documentation

## 2015-04-20 DIAGNOSIS — M545 Low back pain: Secondary | ICD-10-CM | POA: Diagnosis present

## 2015-04-20 DIAGNOSIS — M47816 Spondylosis without myelopathy or radiculopathy, lumbar region: Secondary | ICD-10-CM | POA: Insufficient documentation

## 2015-04-20 DIAGNOSIS — M79605 Pain in left leg: Secondary | ICD-10-CM | POA: Diagnosis present

## 2015-04-20 DIAGNOSIS — M1288 Other specific arthropathies, not elsewhere classified, other specified site: Secondary | ICD-10-CM | POA: Diagnosis not present

## 2015-04-20 DIAGNOSIS — M5416 Radiculopathy, lumbar region: Secondary | ICD-10-CM

## 2015-04-20 DIAGNOSIS — M79604 Pain in right leg: Secondary | ICD-10-CM | POA: Diagnosis present

## 2015-04-20 MED ORDER — HYDROCODONE-ACETAMINOPHEN 10-325 MG PO TABS: ORAL_TABLET | ORAL | Status: DC

## 2015-04-20 NOTE — Patient Instructions (Addendum)
Continue present medications.   Greater occipital nerve block Monday, 05/08/2015  F/U PCP for evaliation of  BP and general medical  condition.  F/U surgical evaluation.  F/U neurological evaluation.  May consider radiofrequency rhizolysis or intraspinal procedures pending response to present treatment and F/U evaluation.  Patient to call [facility] should patient have concerns prior to scheduled return appointment. Occipital Nerve Block Patient Information  Description: The occipital nerves originate in the cervical (neck) spinal cord and travel upward through muscle and tissue to supply sensation to the back of the head and top of the scalp.  In addition, the nerves control some of the muscles of the scalp.  Occipital neuralgia is an irritation of these nerves which can cause headaches, numbness of the scalp, and neck discomfort.     The occipital nerve block will interrupt nerve transmission through these nerves and can relieve pain and spasm.  The block consists of insertion of a small needle under the skin in the back of the head to deposit local anesthetic (numbing medicine) and/or steroids around the nerve.  The entire block usually lasts less than 5 minutes.  Conditions which may be treated by occipital blocks:   Muscular pain and spasm of the scalp  Nerve irritation, back of the head  Headaches  Upper neck pain  Preparation for the injection:  1. Do not eat any solid food or dairy products within 6 hours of your appointment. 2. You may drink clear liquids up to 2 hours before appointment.  Clear liquids include water, black coffee, juice or soda.  No milk or cream please. 3. You may take your regular medication, including pain medications, with a sip of water before you appointment.  Diabetics should hold regular insulin (if taken separately) and take 1/2 normal NPH dose the morning of the procedure.  Carry some sugar containing items with you to your  appointment. 4. A driver must accompany you and be prepared to drive you home after your procedure. 5. Bring all your current medications with you. 6. An IV may be inserted and sedation may be given at the discretion of the physician. 7. A blood pressure cuff, EKG, and other monitors will often be applied during the procedure.  Some patients may need to have extra oxygen administered for a short period. 8. You will be asked to provide medical information, including your allergies and medications, prior to the procedure.  We must know immediately if you are taking blood thinners (like Coumadin/Warfarin) or if you are allergic to IV iodine contrast (dye).  We must know if you could possible be pregnant.  9. Do not wear a high collared shirt or turtleneck.  Tie long hair up in the back if possible.  Possible side-effects:   Bleeding from needle site  Infection (rare, may require surgery)  Nerve injury (rare)  Hair on back of neck can be tinged with iodine scrub (this will wash out)  Light-headedness (temporary)  Pain at injection site (several days)  Decreased blood pressure (rare, temporary)  Seizure (very rare)  Call if you experience:   Hives or difficulty breathing ( go to the emergency room)  Inflammation or drainage at the injection site(s)  Please note:  Although the local anesthetic injected can often make your painful muscles or headache feel good for several hours after the injection, the pain may return.  It takes 3-7 days for steroids to work.  You may not notice any pain relief for at least one  week.  If effective, we will often do a series of injections spaced 3-6 weeks apart to maximally decrease your pain.  If you have any questions, please call [phone] [facility] 17-Ketosteroids This is a 24-hour urine test in which the breakdown products of the sex hormones are measured. 17-ketosteroids form when the body breaks down  female sex hormones and other hormones released by the adrenal cortex. This test is used to help diagnose adrenal problems, including adrenal tumors, and adrenal enlargement (hyperplasia). PREPARATION FOR TEST Medications that interfere with the test should be held for several days prior to testing. Ask your caregiver which medications should be withheld and for how long. NORMAL FINDINGS 2. Female: 6-20 mg/24 hr or 20-70 micromole/day (SI units). 3. Female: 6-17 mg/24 hr or 20-60 micromole/day (SI units). 4. Elderly: values decrease with age. 5. Child under 12 years: less than 5 mg/24 hr. 6. Child 12-15 years: 5-12 mg/24 hr. Ranges for normal findings may vary among different laboratories and hospitals. You should always check with your doctor after having lab work or other tests done to discuss the meaning of your test results and whether your values are considered within normal limits. MEANING OF TEST  Your caregiver will go over the test results with you and discuss the importance and meaning of your results, as well as treatment options and the need for additional tests if necessary. OBTAINING THE TEST RESULTS  It is your responsibility to obtain your test results. Ask the lab or department performing the test when and how you will get your results. Document Released: 11/05/2004 Document Revised: 02/28/2014 Document Reviewed: 09/14/2008 [facility] Patient Information 2015 [place], [place]. This information is not intended to replace advice given to you by your health care provider. Make sure you discuss any questions you have with your health care provider. Pain Management Discharge Instructions  General Discharge Instructions :  If you need to reach your doctor call: Monday-Friday 8:00 am - 4:00 pm at [phone] or toll free [phone].  After clinic hours [phone] to have operator reach doctor.  Bring all of your medication bottles to all your appointments in the pain clinic.  To  cancel or reschedule your appointment with Pain Management please remember to call 24 hours in advance to avoid a fee.  Refer to the educational materials which you have been given on: General Risks, I had my Procedure. Discharge Instructions, Post Sedation.  Post Procedure Instructions:  The drugs you were given will stay in your system until tomorrow, so for the next 24 hours you should not drive, make any legal decisions or drink any alcoholic beverages.  You may eat anything you prefer, but it is better to start with liquids then soups and crackers, and gradually work up to solid foods.  Please notify your doctor immediately if you have any unusual bleeding, trouble breathing or pain that is not related to your normal pain.  Depending on the type of procedure that was done, some parts of your body may feel week and/or numb.  This usually clears up by tonight or the next day.  Walk with the use of an assistive device or accompanied by an adult for the 24 hours.  You may use ice on the affected area for the first 24 hours.  Put ice in a Ziploc bag and cover with a towel and place against area 15 minutes on 15 minutes off.  You may switch to heat after 24 hours.GENERAL RISKS AND  COMPLICATIONS  What are the risk, side effects and possible complications? Generally speaking, most procedures are safe.  However, with any procedure there are risks, side effects, and the possibility of complications.  The risks and complications are dependent upon the sites that are lesioned, or the type of nerve block to be performed.  The closer the procedure is to the spine, the more serious the risks are.  Great care is taken when placing the radio frequency needles, block needles or lesioning probes, but sometimes complications can occur. 1. Infection: Any time there is an injection through the skin, there is a risk of infection.  This is why sterile conditions are used for these blocks.  There are four possible  types of infection. 1. Localized skin infection. 2. Central Nervous System Infection-This can be in the form of Meningitis, which can be deadly. 3. Epidural Infections-This can be in the form of an epidural abscess, which can cause pressure inside of the spine, causing compression of the spinal cord with subsequent paralysis. This would require an emergency surgery to decompress, and there are no guarantees that the patient would recover from the paralysis. 4. Discitis-This is an infection of the intervertebral discs.  It occurs in about 1% of discography procedures.  It is difficult to treat and it may lead to surgery.        2. Pain: the needles have to go through skin and soft tissues, will cause soreness.       3. Damage to internal structures:  The nerves to be lesioned may be near blood vessels or    other nerves which can be potentially damaged.       4. Bleeding: Bleeding is more common if the patient is taking blood thinners such as  aspirin, Coumadin, Ticiid, Plavix, etc., or if he/she have some genetic predisposition  such as hemophilia. Bleeding into the spinal canal can cause compression of the spinal  cord with subsequent paralysis.  This would require an emergency surgery to  decompress and there are no guarantees that the patient would recover from the  paralysis.       5. Pneumothorax:  Puncturing of a lung is a possibility, every time a needle is introduced in  the area of the chest or upper back.  Pneumothorax refers to free air around the  collapsed lung(s), inside of the thoracic cavity (chest cavity).  Another two possible  complications related to a similar event would include: Hemothorax and Chylothorax.   These are variations of the Pneumothorax, where instead of air around the collapsed  lung(s), you may have blood or chyle, respectively.       6. Spinal headaches: They may occur with any procedures in the area of the spine.       7. Persistent CSF (Cerebro-Spinal Fluid)  leakage: This is a rare problem, but may occur  with prolonged intrathecal or epidural catheters either due to the formation of a fistulous  track or a dural tear.       8. Nerve damage: By working so close to the spinal cord, there is always a possibility of  nerve damage, which could be as serious as a permanent spinal cord injury with  paralysis.       9. Death:  Although rare, severe deadly allergic reactions known as "Anaphylactic  reaction" can occur to any of the medications used.      10. Worsening of the symptoms:  We can always make thing worse.  What  are the chances of something like this happening? Chances of any of this occuring are extremely low.  By statistics, you have more of a chance of getting killed in a motor vehicle accident: while driving to the hospital than any of the above occurring .  Nevertheless, you should be aware that they are possibilities.  In general, it is similar to taking a shower.  Everybody knows that you can slip, hit your head and get killed.  Does that mean that you should not shower again?  Nevertheless always keep in mind that statistics do not mean anything if you happen to be on the wrong side of them.  Even if a procedure has a 1 (one) in a 1,000,000 (million) chance of going wrong, it you happen to be that one..Also, keep in mind that by statistics, you have more of a chance of having something go wrong when taking medications.  Who should not have this procedure? If you are on a blood thinning medication (e.g. Coumadin, Plavix, see list of "Blood Thinners"), or if you have an active infection going on, you should not have the procedure.  If you are taking any blood thinners, please inform your physician.  How should I prepare for this procedure?  Do not eat or drink anything at least six hours prior to the procedure.  Bring a driver with you .  It cannot be a taxi.  Come accompanied by an adult that can drive you back, and that is strong enough to  help you if your legs get weak or numb from the local anesthetic.  Take all of your medicines the morning of the procedure with just enough water to swallow them.  If you have diabetes, make sure that you are scheduled to have your procedure done first thing in the morning, whenever possible.  If you have diabetes, take only half of your insulin dose and notify our nurse that you have done so as soon as you arrive at the clinic.  If you are diabetic, but only take blood sugar pills (oral hypoglycemic), then do not take them on the morning of your procedure.  You may take them after you have had the procedure.  Do not take aspirin or any aspirin-containing medications, at least eleven (11) days prior to the procedure.  They may prolong bleeding.  Wear loose fitting clothing that may be easy to take off and that you would not mind if it got stained with Betadine or blood.  Do not wear any jewelry or perfume  Remove any nail coloring.  It will interfere with some of our monitoring equipment.  NOTE: Remember that this is not meant to be interpreted as a complete list of all possible complications.  Unforeseen problems may occur.  BLOOD THINNERS The following drugs contain aspirin or other products, which can cause increased bleeding during surgery and should not be taken for 2 weeks prior to and 1 week after surgery.  If you should need take something for relief of minor pain, you may take acetaminophen which is found in Tylenol,m Datril, Anacin-3 and Panadol. It is not blood thinner. The products listed below are.  Do not take any of the products listed below in addition to any listed on your instruction sheet.  A.P.C or A.P.C with Codeine Codeine Phosphate Capsules #3 Ibuprofen Ridaura  ABC compound Congesprin Imuran rimadil  Advil Cope Indocin Robaxisal  Alka-Seltzer Effervescent Pain Reliever and Antacid Coricidin or Coricidin-D  Indomethacin Rufen  Alka-Seltzer plus  Cold Medicine Cosprin  Ketoprofen S-A-C Tablets  Anacin Analgesic Tablets or Capsules Coumadin Korlgesic Salflex  Anacin Extra Strength Analgesic tablets or capsules CP-2 Tablets Lanoril Salicylate  Anaprox Cuprimine Capsules Levenox Salocol  Anexsia-D Dalteparin Magan Salsalate  Anodynos Darvon compound Magnesium Salicylate Sine-off  Ansaid Dasin Capsules Magsal Sodium Salicylate  Anturane Depen Capsules Marnal Soma  APF Arthritis pain formula Dewitt's Pills Measurin Stanback  Argesic Dia-Gesic Meclofenamic Sulfinpyrazone  Arthritis Bayer Timed Release Aspirin Diclofenac Meclomen Sulindac  Arthritis pain formula Anacin Dicumarol Medipren Supac  Analgesic (Safety coated) Arthralgen Diffunasal Mefanamic Suprofen  Arthritis Strength Bufferin Dihydrocodeine Mepro Compound Suprol  Arthropan liquid Dopirydamole Methcarbomol with Aspirin Synalgos  ASA tablets/Enseals Disalcid Micrainin Tagament  Ascriptin Doan's Midol Talwin  Ascriptin A/D Dolene Mobidin Tanderil  Ascriptin Extra Strength Dolobid Moblgesic Ticlid  Ascriptin with Codeine Doloprin or Doloprin with Codeine Momentum Tolectin  Asperbuf Duoprin Mono-gesic Trendar  Aspergum Duradyne Motrin or Motrin IB Triminicin  Aspirin plain, buffered or enteric coated Durasal Myochrisine Trigesic  Aspirin Suppositories Easprin Nalfon Trillsate  Aspirin with Codeine Ecotrin Regular or Extra Strength Naprosyn Uracel  Atromid-S Efficin Naproxen Ursinus  Auranofin Capsules Elmiron Neocylate Vanquish  Axotal Emagrin Norgesic Verin  Azathioprine Empirin or Empirin with Codeine Normiflo Vitamin E  Azolid Emprazil Nuprin Voltaren  Bayer Aspirin plain, buffered or children's or timed BC Tablets or powders Encaprin Orgaran Warfarin Sodium  Buff-a-Comp Enoxaparin Orudis Zorpin  Buff-a-Comp with Codeine Equegesic Os-Cal-Gesic   Buffaprin Excedrin plain, buffered or Extra Strength Oxalid   Bufferin Arthritis Strength Feldene Oxphenbutazone   Bufferin plain or Extra Strength  Feldene Capsules Oxycodone with Aspirin   Bufferin with Codeine Fenoprofen Fenoprofen Pabalate or Pabalate-SF   Buffets II Flogesic Panagesic   Buffinol plain or Extra Strength Florinal or Florinal with Codeine Panwarfarin   Buf-Tabs Flurbiprofen Penicillamine   Butalbital Compound Four-way cold tablets Penicillin   Butazolidin Fragmin Pepto-Bismol   Carbenicillin Geminisyn Percodan   Carna Arthritis Reliever Geopen Persantine   Carprofen Gold's salt Persistin   Chloramphenicol Goody's Phenylbutazone   Chloromycetin Haltrain Piroxlcam   Clmetidine heparin Plaquenil   Cllnoril Hyco-pap Ponstel   Clofibrate Hydroxy chloroquine Propoxyphen         Before stopping any of these medications, be sure to consult the physician who ordered them.  Some, such as Coumadin (Warfarin) are ordered to prevent or treat serious conditions such as "deep thrombosis", "pumonary embolisms", and other heart problems.  The amount of time that you may need off of the medication may also vary with the medication and the reason for which you were taking it.  If you are taking any of these medications, please make sure you notify your pain physician before you undergo any procedures.

## 2015-04-20 NOTE — Progress Notes (Signed)
   Subjective:    Patient ID: Jennifer [name], female    DOB: July 20, 1956, 59 y.o.   MRN: [id number]  HPI  Patient is 59 year old female who returns to [facility] for further evaluation and treatment of pain involving the region of the lower back and lower extremity regions. Patient states that she has significant improvement of her lower back and lower extremity pain following prior interventional treatment performed in [facility]. Patient states the present pain involves region of the neck and shoulders with pain radiating from the neck to the occipital region of the head. Patient without trauma change in events of daily living the call significant change in symptomatology. Patient admits to significant muscle spasms occurring in the trapezius musculature region splenius capitis and occipitalis musculature regions. Patient states the pain interferes with activities of daily living also interferes with ability to obtain restful sleep. We will proceed with greater occipital nerve blocks to be performed at time return appointment is discussed with patient on today's visit. Patient is understanding and agrees with suggested treatment plan.     Review of Systems     Objective:   Physical Exam  Palpation of the splenius capitis and occipitalis region reproduced severe discomfort on the left as well as on the right. No new lesions of the head and neck were noted. There was tends to palpation of the cervical facet cervical paraspinal musculature region lumbar facet lumbar paraspinal musculature region of moderate moderately severe degree. Palpation over the thoracic facet thoracic paraspinal musculature region was with moderate moderately severe discomfort as well there was no crepitus of the thoracic region noted. Patient appeared to be with bilaterally equal grip strength and Tinel and Phalen's maneuver were without increase of pain of any significant degree Deformity lesions of  the head and neck were noted. Palpation over the lumbar paraspinal musculature region lumbar facet region was a tends to palpation with extension and palpation of the lumbar facets reproducing moderate discomfort. There was mild tenderness of the greater trochanteric region iliotibial band region. Note sensory deficit of dermatomal distribution detected negative clonus negative Homans. Abdomen nontender and no costovertebral tenderness noted.      Assessment & Plan:  Degenerative disc disease lumbar spine Status post surgical intervention lumbar region L5-S1 posterior lumbar interbody fusion 10-12 mm right facet intraspinal synovial cyst, mass effect upon the right lateral thecal sac and mild medial displacement of the right intraspinal S1 nerve root as well as abutting the foraminal portion of the L5 nerve root L4-5 broad-based disc bulge with moderate bilateral facet arthropathy and mild right foraminal stenosis  Bilateral greater occipital neuralgia   Plan   Continue present medications. Lyrica and hydrocodone acetaminophen  F/U PCP for evaliation of  BP and general medical  condition.  F/U surgical evaluation. Neurosurgical reevaluation scheduled for evaluation of lower back lower extremity pain with MRI findings as mentioned in the assessment portion of report  F/U neurological evaluation.  May consider radiofrequency rhizolysis or intraspinal procedures pending response to present treatment and F/U evaluation.  Patient to call [facility] Management Center should patient have concerns prior to scheduled return appointment.

## 2015-04-20 NOTE — Progress Notes (Signed)
Safety precautions to be maintained throughout the outpatient stay will include: orient to surroundings, keep bed in low position, maintain call bell within reach at all times, provide assistance with transfer out of bed and ambulation.  1310 discharged to home. Pre procedure instructions given. Script given as ordered.  Bethann Humble RN

## 2015-05-04 ENCOUNTER — Other Ambulatory Visit: Payer: Self-pay | Admitting: Pain Medicine

## 2015-05-05 ENCOUNTER — Other Ambulatory Visit: Payer: Self-pay | Admitting: Pain Medicine

## 2015-05-09 ENCOUNTER — Ambulatory Visit: Admitting: Pain Medicine

## 2015-05-10 ENCOUNTER — Ambulatory Visit: Attending: Pain Medicine | Admitting: Pain Medicine

## 2015-05-10 ENCOUNTER — Encounter: Payer: Self-pay | Admitting: Pain Medicine

## 2015-05-10 VITALS — BP 115/55 | HR 89 | Temp 98.6°F | Resp 16 | Ht 63.0 in | Wt 160.0 lb

## 2015-05-10 DIAGNOSIS — M5481 Occipital neuralgia: Secondary | ICD-10-CM

## 2015-05-10 DIAGNOSIS — M47816 Spondylosis without myelopathy or radiculopathy, lumbar region: Secondary | ICD-10-CM

## 2015-05-10 DIAGNOSIS — M5137 Other intervertebral disc degeneration, lumbosacral region: Secondary | ICD-10-CM

## 2015-05-10 DIAGNOSIS — R51 Headache: Secondary | ICD-10-CM | POA: Insufficient documentation

## 2015-05-10 DIAGNOSIS — M5416 Radiculopathy, lumbar region: Secondary | ICD-10-CM

## 2015-05-10 DIAGNOSIS — M47812 Spondylosis without myelopathy or radiculopathy, cervical region: Secondary | ICD-10-CM | POA: Insufficient documentation

## 2015-05-10 DIAGNOSIS — M51379 Other intervertebral disc degeneration, lumbosacral region without mention of lumbar back pain or lower extremity pain: Secondary | ICD-10-CM

## 2015-05-10 DIAGNOSIS — M542 Cervicalgia: Secondary | ICD-10-CM | POA: Diagnosis present

## 2015-05-10 MED ORDER — MIDAZOLAM HCL 5 MG/5ML IJ SOLN
INTRAMUSCULAR | Status: AC
  Administered 2015-05-10: 5 mg via INTRAVENOUS
  Filled 2015-05-10: qty 5

## 2015-05-10 MED ORDER — TRIAMCINOLONE ACETONIDE 40 MG/ML IJ SUSP
INTRAMUSCULAR | Status: AC
  Administered 2015-05-10: 12:00:00
  Filled 2015-05-10: qty 1

## 2015-05-10 MED ORDER — BUPIVACAINE HCL (PF) 0.25 % IJ SOLN
INTRAMUSCULAR | Status: AC
  Administered 2015-05-10: 12:00:00
  Filled 2015-05-10: qty 30

## 2015-05-10 MED ORDER — ORPHENADRINE CITRATE 30 MG/ML IJ SOLN
INTRAMUSCULAR | Status: AC
  Administered 2015-05-10: 12:00:00
  Filled 2015-05-10: qty 2

## 2015-05-10 MED ORDER — FENTANYL CITRATE (PF) 100 MCG/2ML IJ SOLN
INTRAMUSCULAR | Status: AC
  Administered 2015-05-10: 100 ug via INTRAVENOUS
  Filled 2015-05-10: qty 2

## 2015-05-10 NOTE — Patient Instructions (Addendum)
Continue present medications Lyrica and hydrocodone acetaminophen  F/U PCP for evaliation of  BP and general medical  condition.  F/U surgical evaluation  F/U neurological evaluation  May consider radiofrequency rhizolysis or intraspinal procedures pending response to present treatment and F/U evaluation.  Patient to call [facility] Center should patient have concerns prior to scheduled return appointment. Pain Management Discharge Instructions  General Discharge Instructions :  If you need to reach your doctor call: Monday-Friday 8:00 am - 4:00 pm at [phone] or toll free [phone].  After clinic hours [phone] to have operator reach doctor.  Bring all of your medication bottles to all your appointments in the pain clinic.  To cancel or reschedule your appointment with Pain Management please remember to call 24 hours in advance to avoid a fee.  Refer to the educational materials which you have been given on: General Risks, I had my Procedure. Discharge Instructions, Post Sedation.  Post Procedure Instructions:  The drugs you were given will stay in your system until tomorrow, so for the next 24 hours you should not drive, make any legal decisions or drink any alcoholic beverages.  You may eat anything you prefer, but it is better to start with liquids then soups and crackers, and gradually work up to solid foods.  Please notify your doctor immediately if you have any unusual bleeding, trouble breathing or pain that is not related to your normal pain.  Depending on the type of procedure that was done, some parts of your body may feel week and/or numb.  This usually clears up by tonight or the next day.  Walk with the use of an assistive device or accompanied by an adult for the 24 hours.  You may use ice on the affected area for the first 24 hours.  Put ice in a Ziploc bag and cover with a towel and place against area 15 minutes on 15 minutes off.  You may switch  to heat after 24 hours.

## 2015-05-10 NOTE — Progress Notes (Signed)
   Subjective:    Patient ID: Jennifer [name], female    DOB: 11/30/1955, 59 y.o.   MRN: [id number]  HPI NOTE: The patient is a 59 y.o.-year-old female who returns to the [facility] for further evaluation and treatment of pain consisting of pain involving the region of the neck and headache.  Patient is with history of degenerative changes of the cervical spine with pain radiating from the neck to the back of the head with concern regarding significant component of patient's pain being due to greater occipital neuralgia in addition to cervicogenic headaches .  The risks, benefits, and expectations of the procedure have been discussed and explained to patient, who is understanding and wishes to proceed with interventional treatment as discussed and as explained to patient.  Will proceed with greater occipital nerve blocks with myoneural block injections at this time as discussed and as explained to patient.  All are understanding and in agreement with suggested treatment plan.    PROCEDURE:  Greater occipital nerve block on the left side with IV Versed, IV Fentanyl, conscious sedation, EKG, blood pressure, pulse, pulse oximetry monitoring.  Procedure performed with patient in prone position.  Greater occipital nerve block on the left side.   With patient in prone position, Betadine prep of proposed entry site accomplished.  Following identification of the nuchal ridge, 22 -gauge needle was inserted at the level of the nuchal ridge medial to the occipital artery.  Following negative aspiration, 4cc 0.25% bupivacaine with Kenalog injected for left greater occipital nerve block.  Needle was removed.  Patient tolerated injection well.   Greater occipital nerve block on the rightt side. The greater occipital nerve block on the right side was performed exactly as the left greater occipital nerve block was performed and utilizing the same technique.  Myoneural block injections of the trapezius  musculature region Following Betadine prep of proposed entry site, a 22-gauge needle was inserted in the trapezius musculature region and following negative aspiration 2 cc of 0.25% bupivacaine with Norflex was injected for myoneural block injections of the trapezius musculature region performed 4  The patient tolerated the procedure well   A total of 10 mg Kenalog was utilized for the entire procedure.  PLAN:    1. Medications: Will continue presently prescribed medications at this time consisting of Lyrica and hydrocodone acetaminophen 2. Patient to follow up with primary care physician Dr. Princess Bruins evaluation of blood pressure and general medical condition status post procedure performed on today's visit. 3. Neurological evaluation for further assessment of headaches for further studies as discussed. 4. Surgical evaluation as discussed.  5. Patient may be candidate for Botox injections, radiofrequency procedures, as well as implantation type procedures pending response to treatment rendered on today's visit and pending follow-up evaluation. 6. Patient has been advised to adhere to proper body mechanics and to avoid activities which appear to aggravate condition.cations:  Will continue presently prescribed medications at this time. 7. The patient is understanding and in agreement with the suggested treatment plan.       Review of Systems     Objective:   Physical Exam        Assessment & Plan:

## 2015-05-10 NOTE — Progress Notes (Signed)
Safety precautions to be maintained throughout the outpatient stay will include: orient to surroundings, keep bed in low position, maintain call bell within reach at all times, provide assistance with transfer out of bed and ambulation.  

## 2015-05-11 ENCOUNTER — Ambulatory Visit (INDEPENDENT_AMBULATORY_CARE_PROVIDER_SITE_OTHER): Admitting: Psychiatry

## 2015-05-11 ENCOUNTER — Telehealth: Payer: Self-pay | Admitting: *Deleted

## 2015-05-11 ENCOUNTER — Encounter: Payer: Self-pay | Admitting: Psychiatry

## 2015-05-11 VITALS — BP 132/86 | HR 81 | Ht 62.38 in | Wt 166.6 lb

## 2015-05-11 DIAGNOSIS — F3130 Bipolar disorder, current episode depressed, mild or moderate severity, unspecified: Secondary | ICD-10-CM

## 2015-05-11 MED ORDER — ZOLPIDEM TARTRATE 10 MG PO TABS: 10.0000 mg | ORAL_TABLET | Freq: Every day | ORAL | Status: DC

## 2015-05-11 MED ORDER — ALPRAZOLAM 0.5 MG PO TABS: 0.5000 mg | ORAL_TABLET | Freq: Every evening | ORAL | Status: DC | PRN

## 2015-05-11 NOTE — Telephone Encounter (Signed)
Denies cimplications post procedure

## 2015-05-11 NOTE — Progress Notes (Signed)
BH MD/PA/NP OP Progress Note  05/11/2015 1:08 PM AMERIAH [name]  MRN:  [id number]  Subjective:    Patient is a 59 year old married female who presented for the follow-up appointment. She reported that she is currently stressed out because her husband is having a difficult time at his work and she is concerned about him. She reported that she wants to have her Xanax dose increased at this time. She is also taking Ambien at night to help her sleep. Patient reported that she is compliant with her other medications and her mood symptoms are  under control. She is on the combination of Seroquel by. And lithium. She reported that she takes Seroquel and Ambien at night and it helps her and bedtime. She currently denied having any suicidal homicidal ideations or plans. She denied having any mood swings. She reported that she lives with her husband and her grandchildren. Her daughter-in-law is pregnant but she does not want to disclose to her husband because he is under a stressful situation.    Chief Complaint:  Chief Complaint    Follow-up; Anxiety; Manic Behavior     Visit Diagnosis:     ICD-9-CM ICD-10-CM   1. Bipolar I disorder, most recent episode depressed 296.50 F31.30     Past Medical History:  Past Medical History  Diagnosis Date  . GERD (gastroesophageal reflux disease)   . Hyperlipidemia   . Hypertension   . Enlarged liver   . Diabetes mellitus   . Low back pain   . Depression with anxiety   . CAD (coronary artery disease)   . Von Willebrand disease   . Migraines   . Depression   . Persistent disorder of initiating or maintaining sleep   . Generalized anxiety disorder   . Bipolar depression   . Anxiety     Past Surgical History  Procedure Laterality Date  . Cardiac catheterization      CAD,PCI of LAD, Cypher stent 2.5-28mm  . Cardiac catheterization      PCI of mid-LAD in stent restenosis with a drug-eluting stent  . Breast implant removal    . Coronary artery bypass  graft   08-12-2011    CABG x 3  . Cardiac catheterization  06/03/13    [facility];MID LAD 100 % STENOSIS; PRIOR STENT; MID RCA 40 % STENOSIS  . Coronary stent placement    . Lumbar fusion      L1-S1  . Abdominal hysterectomy    . Back surgery     Family History:  Family History  Problem Relation Age of Onset  . Hypertension Other     family hx  . Hyperlipidemia Other     family hx  . Diabetes Other     family hx  . Aneurysm Father     abdominal  . Diabetes Father   . Alcohol abuse Father   . Hypertension Father   . Aneurysm Brother     abdominal  . Alcohol abuse Brother   . Hypertension Brother   . Diabetes Mother   . Alcohol abuse Mother    Social History:  History   Social History  . Marital Status: Married    Spouse Name: N/A  . Number of Children: N/A  . Years of Education: N/A   Social History Main Topics  . Smoking status: Former Smoker -- 16 years    Types: Cigarettes    Quit date: 06/20/2010  . Smokeless tobacco: Never Used  . Alcohol Use: No  . Drug  Use: No  . Sexual Activity: Yes   Other Topics Concern  . None   Social History Narrative   Additional History:  Lives with her family members and has good relationship with them.  Assessment:   Musculoskeletal: Strength & Muscle Tone: within normal limits Gait & Station: normal Patient leans: N/A  Psychiatric Specialty Exam: HPI  Review of Systems  Constitutional: Negative.   HENT: Negative.  Negative for nosebleeds and tinnitus.   Eyes: Negative.  Negative for photophobia.  Respiratory: Negative.   Cardiovascular: Negative.  Negative for palpitations.  Gastrointestinal: Negative for nausea and diarrhea.  Genitourinary: Negative for frequency.  Skin: Negative.   Neurological: Negative for tremors.  Endo/Heme/Allergies: Negative for environmental allergies.  Psychiatric/Behavioral: Positive for depression. Negative for suicidal ideas and substance abuse. The patient is nervous/anxious and has  insomnia.     Blood pressure 132/86, pulse 81, height 5' 2.38" (1.584 m), weight 166 lb 9.6 oz (75.569 kg).Body mass index is 30.12 kg/(m^2).  General Appearance: Casual  Eye Contact:  Fair  Speech:  Slow  Volume:  Decreased  Mood:  Anxious and Depressed  Affect:  Congruent  Thought Process:  Logical  Orientation:  Full (Time, Place, and Person)  Thought Content:  WDL  Suicidal Thoughts:  No  Homicidal Thoughts:  No  Memory:  Immediate;   Fair  Judgement:  Fair  Insight:  Fair  Psychomotor Activity:  Normal  Concentration:  Fair  Recall:  [employer] of Knowledge: Fair  Language: Fair  Akathisia:  No  Handed:  Right  AIMS (if indicated):    Assets:  Communication Skills Desire for Improvement Housing Social Support  ADL's:  Intact  Cognition: WNL  Sleep:  good   Is the patient at risk to self?  No. Has the patient been a risk to self in the past 6 months?  No. Has the patient been a risk to self within the distant past?  Yes.   Is the patient a risk to others?  No. Has the patient been a risk to others in the past 6 months?  No. Has the patient been a risk to others within the distant past?  No.  Current Medications: Current Outpatient Prescriptions  Medication Sig Dispense Refill  . albuterol-ipratropium (COMBIVENT) 18-103 MCG/ACT inhaler Inhale into the lungs as needed for wheezing or shortness of breath.    . ALPRAZolam (XANAX) 0.5 MG tablet Take 0.5 mg by mouth at bedtime as needed for anxiety.    Marland Kitchen amLODipine (NORVASC) 10 MG tablet Take 1 tablet by mouth daily.    Marland Kitchen aspirin 325 MG tablet Take 325 mg by mouth daily.      Marland Kitchen atorvastatin (LIPITOR) 40 MG tablet Take 1 tablet (40 mg total) by mouth daily. 90 tablet 3  . carvedilol (COREG) 12.5 MG tablet Take 1 tablet (12.5 mg total) by mouth 2 (two) times daily with a meal. 180 tablet 3  . cefUROXime (CEFTIN) 250 MG tablet Take 1 tablet (250 mg total) by mouth 2 (two) times daily with a meal. 14 tablet 0  .  Cholecalciferol (VITAMIN D) 2000 UNITS tablet Take 2,000 Units by mouth daily.    . fenofibrate micronized (LOFIBRA) 134 MG capsule Take 134 mg by mouth daily before breakfast.    . HYDROcodone-acetaminophen (NORCO) 10-325 MG per tablet Limit 1 tab by mouth 3-5 times per day if tolerated 150 tablet 0  . Insulin Isophane & Regular (HUMULIN 70/30 PEN [place]) Inject 27 Units into the  skin 2 (two) times daily.     . isosorbide mononitrate (IMDUR) 60 MG 24 hr tablet Take 1 tablet (60 mg total) by mouth daily. 90 tablet 3  . levothyroxine (SYNTHROID, LEVOTHROID) 25 MCG tablet Take 1 tablet by mouth daily.    Marland Kitchen lisinopril (PRINIVIL,ZESTRIL) 20 MG tablet Take 1 tablet (20 mg total) by mouth 2 (two) times daily. 180 tablet 3  . lithium carbonate 300 MG capsule Take 1 capsule by mouth daily.    . Magnesium 400 MG CAPS Take 400 mg by mouth daily.    . magnesium oxide (MAG-OX) 400 MG tablet Take 1 tablet by mouth daily.    . metFORMIN (GLUCOPHAGE) 500 MG tablet Take 1,000 mg by mouth 2 (two) times daily with a meal.    . nitroGLYCERIN (NITROSTAT) 0.4 MG SL tablet Place 1 tablet (0.4 mg total) under the tongue every 5 (five) minutes as needed for chest pain. 25 tablet 6  . omeprazole (PRILOSEC) 20 MG capsule Take 20 mg by mouth daily.     . pregabalin (LYRICA) 50 MG capsule 1 tab by mouth per day or twice a day if tolerated (Patient taking differently: 1 tab by mouth per day) 40 capsule 0  . QUEtiapine (SEROQUEL XR) 300 MG 24 hr tablet Take 300 mg by mouth at bedtime. 1 tablets every hs    . ranolazine (RANEXA) 1000 MG SR tablet Take 1 tablet (1,000 mg total) by mouth 2 (two) times daily.    Marland Kitchen rOPINIRole (REQUIP) 2 MG tablet Take 2 mg by mouth at bedtime as needed.     . Vilazodone HCl (VIIBRYD) 10 MG TABS Take 10 mg by mouth daily.    Marland Kitchen zolpidem (AMBIEN) 10 MG tablet Take 1 tablet by mouth at bedtime.    . BuPROPion HCl (WELLBUTRIN PO) Take 75 mg by mouth daily.      No current facility-administered medications  for this visit.    Medical Decision Making:  Review of Psycho-Social Stressors (1) and Review of Last Therapy Session (1)  Treatment Plan Summary:Medication management  Discussed with patient that I can titrate the  dose of Xanax and will stop the Ambien as it can affect her memory. However she wants to continue the Ambien in the combination with Seroquel to help with her sleep and does not want to go higher on the dose of Xanax at this time. She has enough supply of Seroquel by bradycardia and lithium as she gets them to the mail order pharmacy. She will call for the refill of those medications. Patient will follow up in 3 months or earlier   More than 50% of the time spent in psychoeducation, counseling and coordination of care.    This note was generated in part or whole with voice recognition software. Voice regonition is usually quite accurate but there are transcription errors that can and very often do occur. I apologize for any typographical errors that were not detected and corrected.    Rainey Pines 05/11/2015, 1:08 PM

## 2015-05-23 ENCOUNTER — Ambulatory Visit: Attending: Pain Medicine | Admitting: Pain Medicine

## 2015-05-23 ENCOUNTER — Encounter: Payer: Self-pay | Admitting: Pain Medicine

## 2015-05-23 ENCOUNTER — Ambulatory Visit: Admitting: Cardiovascular Disease

## 2015-05-23 VITALS — BP 147/83 | HR 81 | Temp 97.6°F | Resp 16 | Ht 63.0 in | Wt 160.0 lb

## 2015-05-23 DIAGNOSIS — M961 Postlaminectomy syndrome, not elsewhere classified: Secondary | ICD-10-CM

## 2015-05-23 DIAGNOSIS — M545 Low back pain: Secondary | ICD-10-CM | POA: Diagnosis present

## 2015-05-23 DIAGNOSIS — M47816 Spondylosis without myelopathy or radiculopathy, lumbar region: Secondary | ICD-10-CM

## 2015-05-23 DIAGNOSIS — M5481 Occipital neuralgia: Secondary | ICD-10-CM | POA: Diagnosis not present

## 2015-05-23 DIAGNOSIS — M5136 Other intervertebral disc degeneration, lumbar region: Secondary | ICD-10-CM | POA: Diagnosis not present

## 2015-05-23 DIAGNOSIS — M79605 Pain in left leg: Secondary | ICD-10-CM | POA: Diagnosis present

## 2015-05-23 DIAGNOSIS — M5126 Other intervertebral disc displacement, lumbar region: Secondary | ICD-10-CM | POA: Insufficient documentation

## 2015-05-23 DIAGNOSIS — M5416 Radiculopathy, lumbar region: Secondary | ICD-10-CM

## 2015-05-23 DIAGNOSIS — M79604 Pain in right leg: Secondary | ICD-10-CM | POA: Diagnosis present

## 2015-05-23 DIAGNOSIS — M51379 Other intervertebral disc degeneration, lumbosacral region without mention of lumbar back pain or lower extremity pain: Secondary | ICD-10-CM

## 2015-05-23 DIAGNOSIS — M4806 Spinal stenosis, lumbar region: Secondary | ICD-10-CM | POA: Diagnosis not present

## 2015-05-23 DIAGNOSIS — M533 Sacrococcygeal disorders, not elsewhere classified: Secondary | ICD-10-CM | POA: Diagnosis not present

## 2015-05-23 DIAGNOSIS — M5137 Other intervertebral disc degeneration, lumbosacral region: Secondary | ICD-10-CM

## 2015-05-23 MED ORDER — PREGABALIN 50 MG PO CAPS: ORAL_CAPSULE | ORAL | Status: DC

## 2015-05-23 MED ORDER — HYDROCODONE-ACETAMINOPHEN 10-325 MG PO TABS: ORAL_TABLET | ORAL | Status: DC

## 2015-05-23 NOTE — Progress Notes (Signed)
Safety precautions to be maintained throughout the outpatient stay will include: orient to surroundings, keep bed in low position, maintain call bell within reach at all times, provide assistance with transfer out of bed and ambulation.  

## 2015-05-23 NOTE — Patient Instructions (Addendum)
Continue present medications Lyrica and hydrocodone acetaminophen  Lumbar epidural steroid injection Monday, 05/29/2015  F/U PCP Dr.Olmedo  for evaliation of  BP and general medical  condition.  F/U surgical evaluation  F/U neurological evaluation  MRI lumbar spine( please ask for date of your MRI)  May consider radiofrequency rhizolysis or intraspinal procedures pending response to present treatment and F/U evaluation.  Patient to call Pain Management Center should patient have concerns prior to scheduled return appointment. Pain Management Discharge Instructions  General Discharge Instructions :  If you need to reach your doctor call: Monday-Friday 8:00 am - 4:00 pm at [phone] or toll free [phone].  After clinic hours [phone] to have operator reach doctor.  Bring all of your medication bottles to all your appointments in the pain clinic.  To cancel or reschedule your appointment with Pain Management please remember to call 24 hours in advance to avoid a fee.  Refer to the educational materials which you have been given on: General Risks, I had my Procedure. Discharge Instructions, Post Sedation.  Post Procedure Instructions:  The drugs you were given will stay in your system until tomorrow, so for the next 24 hours you should not drive, make any legal decisions or drink any alcoholic beverages.  You may eat anything you prefer, but it is better to start with liquids then soups and crackers, and gradually work up to solid foods.  Please notify your doctor immediately if you have any unusual bleeding, trouble breathing or pain that is not related to your normal pain.  Depending on the type of procedure that was done, some parts of your body may feel week and/or numb.  This usually clears up by tonight or the next day.  Walk with the use of an assistive device or accompanied by an adult for the 24 hours.  You may use ice on the affected area for the first 24  hours.  Put ice in a Ziploc bag and cover with a towel and place against area 15 minutes on 15 minutes off.  You may switch to heat after 24 hours.GENERAL RISKS AND COMPLICATIONS  What are the risk, side effects and possible complications? Generally speaking, most procedures are safe.  However, with any procedure there are risks, side effects, and the possibility of complications.  The risks and complications are dependent upon the sites that are lesioned, or the type of nerve block to be performed.  The closer the procedure is to the spine, the more serious the risks are.  Great care is taken when placing the radio frequency needles, block needles or lesioning probes, but sometimes complications can occur. 1. Infection: Any time there is an injection through the skin, there is a risk of infection.  This is why sterile conditions are used for these blocks.  There are four possible types of infection. 1. Localized skin infection. 2. Central Nervous System Infection-This can be in the form of Meningitis, which can be deadly. 3. Epidural Infections-This can be in the form of an epidural abscess, which can cause pressure inside of the spine, causing compression of the spinal cord with subsequent paralysis. This would require an emergency surgery to decompress, and there are no guarantees that the patient would recover from the paralysis. 4. Discitis-This is an infection of the intervertebral discs.  It occurs in about 1% of discography procedures.  It is difficult to treat and it may lead to surgery.        2. Pain: the needles have  to go through skin and soft tissues, will cause soreness.       3. Damage to internal structures:  The nerves to be lesioned may be near blood vessels or    other nerves which can be potentially damaged.       4. Bleeding: Bleeding is more common if the patient is taking blood thinners such as  aspirin, Coumadin, Ticiid, Plavix, etc., or if he/she have some genetic  predisposition  such as hemophilia. Bleeding into the spinal canal can cause compression of the spinal  cord with subsequent paralysis.  This would require an emergency surgery to  decompress and there are no guarantees that the patient would recover from the  paralysis.       5. Pneumothorax:  Puncturing of a lung is a possibility, every time a needle is introduced in  the area of the chest or upper back.  Pneumothorax refers to free air around the  collapsed lung(s), inside of the thoracic cavity (chest cavity).  Another two possible  complications related to a similar event would include: Hemothorax and Chylothorax.   These are variations of the Pneumothorax, where instead of air around the collapsed  lung(s), you may have blood or chyle, respectively.       6. Spinal headaches: They may occur with any procedures in the area of the spine.       7. Persistent CSF (Cerebro-Spinal Fluid) leakage: This is a rare problem, but may occur  with prolonged intrathecal or epidural catheters either due to the formation of a fistulous  track or a dural tear.       8. Nerve damage: By working so close to the spinal cord, there is always a possibility of  nerve damage, which could be as serious as a permanent spinal cord injury with  paralysis.       9. Death:  Although rare, severe deadly allergic reactions known as "Anaphylactic  reaction" can occur to any of the medications used.      10. Worsening of the symptoms:  We can always make thing worse.  What are the chances of something like this happening? Chances of any of this occuring are extremely low.  By statistics, you have more of a chance of getting killed in a motor vehicle accident: while driving to the hospital than any of the above occurring .  Nevertheless, you should be aware that they are possibilities.  In general, it is similar to taking a shower.  Everybody knows that you can slip, hit your head and get killed.  Does that mean that you should not  shower again?  Nevertheless always keep in mind that statistics do not mean anything if you happen to be on the wrong side of them.  Even if a procedure has a 1 (one) in a 1,000,000 (million) chance of going wrong, it you happen to be that one..Also, keep in mind that by statistics, you have more of a chance of having something go wrong when taking medications.  Who should not have this procedure? If you are on a blood thinning medication (e.g. Coumadin, Plavix, see list of "Blood Thinners"), or if you have an active infection going on, you should not have the procedure.  If you are taking any blood thinners, please inform your physician.  How should I prepare for this procedure?  Do not eat or drink anything at least six hours prior to the procedure.  Bring a driver with you .  It  cannot be a taxi.  Come accompanied by an adult that can drive you back, and that is strong enough to help you if your legs get weak or numb from the local anesthetic.  Take all of your medicines the morning of the procedure with just enough water to swallow them.  If you have diabetes, make sure that you are scheduled to have your procedure done first thing in the morning, whenever possible.  If you have diabetes, take only half of your insulin dose and notify our nurse that you have done so as soon as you arrive at the clinic.  If you are diabetic, but only take blood sugar pills (oral hypoglycemic), then do not take them on the morning of your procedure.  You may take them after you have had the procedure.  Do not take aspirin or any aspirin-containing medications, at least eleven (11) days prior to the procedure.  They may prolong bleeding.  Wear loose fitting clothing that may be easy to take off and that you would not mind if it got stained with Betadine or blood.  Do not wear any jewelry or perfume  Remove any nail coloring.  It will interfere with some of our monitoring equipment.  NOTE: Remember that  this is not meant to be interpreted as a complete list of all possible complications.  Unforeseen problems may occur.  BLOOD THINNERS The following drugs contain aspirin or other products, which can cause increased bleeding during surgery and should not be taken for 2 weeks prior to and 1 week after surgery.  If you should need take something for relief of minor pain, you may take acetaminophen which is found in Tylenol,m Datril, Anacin-3 and Panadol. It is not blood thinner. The products listed below are.  Do not take any of the products listed below in addition to any listed on your instruction sheet.  A.P.C or A.P.C with Codeine Codeine Phosphate Capsules #3 Ibuprofen Ridaura  ABC compound Congesprin Imuran rimadil  Advil Cope Indocin Robaxisal  Alka-Seltzer Effervescent Pain Reliever and Antacid Coricidin or Coricidin-D  Indomethacin Rufen  Alka-Seltzer plus Cold Medicine Cosprin Ketoprofen S-A-C Tablets  Anacin Analgesic Tablets or Capsules Coumadin Korlgesic Salflex  Anacin Extra Strength Analgesic tablets or capsules CP-2 Tablets Lanoril Salicylate  Anaprox Cuprimine Capsules Levenox Salocol  Anexsia-D Dalteparin Magan Salsalate  Anodynos Darvon compound Magnesium Salicylate Sine-off  Ansaid Dasin Capsules Magsal Sodium Salicylate  Anturane Depen Capsules Marnal Soma  APF Arthritis pain formula Dewitt's Pills Measurin Stanback  Argesic Dia-Gesic Meclofenamic Sulfinpyrazone  Arthritis Bayer Timed Release Aspirin Diclofenac Meclomen Sulindac  Arthritis pain formula Anacin Dicumarol Medipren Supac  Analgesic (Safety coated) Arthralgen Diffunasal Mefanamic Suprofen  Arthritis Strength Bufferin Dihydrocodeine Mepro Compound Suprol  Arthropan liquid Dopirydamole Methcarbomol with Aspirin Synalgos  ASA tablets/Enseals Disalcid Micrainin Tagament  Ascriptin Doan's Midol Talwin  Ascriptin A/D Dolene Mobidin Tanderil  Ascriptin Extra Strength Dolobid Moblgesic Ticlid  Ascriptin with Codeine  Doloprin or Doloprin with Codeine Momentum Tolectin  Asperbuf Duoprin Mono-gesic Trendar  Aspergum Duradyne Motrin or Motrin IB Triminicin  Aspirin plain, buffered or enteric coated Durasal Myochrisine Trigesic  Aspirin Suppositories Easprin Nalfon Trillsate  Aspirin with Codeine Ecotrin Regular or Extra Strength Naprosyn Uracel  Atromid-S Efficin Naproxen Ursinus  Auranofin Capsules Elmiron Neocylate Vanquish  Axotal Emagrin Norgesic Verin  Azathioprine Empirin or Empirin with Codeine Normiflo Vitamin E  Azolid Emprazil Nuprin Voltaren  Bayer Aspirin plain, buffered or children's or timed BC Tablets or powders Encaprin Orgaran Warfarin Sodium  Buff-a-Comp Enoxaparin Orudis Zorpin  Buff-a-Comp with Codeine Equegesic Os-Cal-Gesic   Buffaprin Excedrin plain, buffered or Extra Strength Oxalid   Bufferin Arthritis Strength Feldene Oxphenbutazone   Bufferin plain or Extra Strength Feldene Capsules Oxycodone with Aspirin   Bufferin with Codeine Fenoprofen Fenoprofen Pabalate or Pabalate-SF   Buffets II Flogesic Panagesic   Buffinol plain or Extra Strength Florinal or Florinal with Codeine Panwarfarin   Buf-Tabs Flurbiprofen Penicillamine   Butalbital Compound Four-way cold tablets Penicillin   Butazolidin Fragmin Pepto-Bismol   Carbenicillin Geminisyn Percodan   Carna Arthritis Reliever Geopen Persantine   Carprofen Gold's salt Persistin   Chloramphenicol Goody's Phenylbutazone   Chloromycetin Haltrain Piroxlcam   Clmetidine heparin Plaquenil   Cllnoril Hyco-pap Ponstel   Clofibrate Hydroxy chloroquine Propoxyphen         Before stopping any of these medications, be sure to consult the physician who ordered them.  Some, such as Coumadin (Warfarin) are ordered to prevent or treat serious conditions such as "deep thrombosis", "pumonary embolisms", and other heart problems.  The amount of time that you may need off of the medication may also vary with the medication and the reason for which  you were taking it.  If you are taking any of these medications, please make sure you notify your pain physician before you undergo any procedures.         Epidural Steroid Injection An epidural steroid injection is given to relieve pain in your neck, back, or legs that is caused by the irritation or swelling of a nerve root. This procedure involves injecting a steroid and numbing medicine (anesthetic) into the epidural space. The epidural space is the space between the outer covering of your spinal cord and the bones that form your backbone (vertebra).  LET [facility] CARE PROVIDER KNOW ABOUT:  2. Any allergies you have. 3. All medicines you are taking, including vitamins, herbs, eye drops, creams, and over-the-counter medicines such as aspirin. 4. Previous problems you or members of your family have had with the use of anesthetics. 5. Any blood disorders or blood clotting disorders you have. 6. Previous surgeries you have had. 7. Medical conditions you have. RISKS AND COMPLICATIONS Generally, this is a safe procedure. However, as with any procedure, complications can occur. Possible complications of epidural steroid injection include:  Headache.  Bleeding.  Infection.  Allergic reaction to the medicines.  Damage to your nerves. The response to this procedure depends on the underlying cause of the pain and its duration. People who have long-term (chronic) pain are less likely to benefit from epidural steroids than are those people whose pain comes on strong and suddenly. BEFORE THE PROCEDURE   Ask your health care provider about changing or stopping your regular medicines. You may be advised to stop taking blood-thinning medicines a few days before the procedure.  You may be given medicines to reduce anxiety.  Arrange for someone to take you home after the procedure. PROCEDURE   You will remain awake during the procedure. You may receive medicine to make you relaxed.  You  will be asked to lie on your stomach.  The injection site will be cleaned.  The injection site will be numbed with a medicine (local anesthetic).  A needle will be injected through your skin into the epidural space.  Your health care provider will use an X-ray machine to ensure that the steroid is delivered closest to the affected nerve. You may have minimal discomfort at this time.  Once the needle  is in the right position, the local anesthetic and the steroid will be injected into the epidural space.  The needle will then be removed and a bandage will be applied to the injection site. AFTER THE PROCEDURE  12. You may be monitored for a short time before you go home. 13. You may feel weakness or numbness in your arm or leg, which disappears within hours. 14. You may be allowed to eat, drink, and take your regular medicine. 15. You may have soreness at the site of the injection. Document Released: 01/21/2008 Document Revised: 06/16/2013 Document Reviewed: 04/02/2013 [facility] Patient Information 2015 [place], [place]. This information is not intended to replace advice given to you by your health care provider. Make sure you discuss any questions you have with your health care provider.

## 2015-05-23 NOTE — Progress Notes (Signed)
   Subjective:    Patient ID: Jennifer [name], female    DOB: Nov 26, 1955, 59 y.o.   MRN: [id number]  HPI  Patient is 59 year old female returns to [place] for further evaluation and treatment of lower back and lower extremity pain. Patient states that she had improvement following previous treatment and [facility] Management Center.. Patient notes return of significant lower back and lower extremity pain associated with weakness. We will proceed with scheduling neurosurgical reevaluation as well as updated lumbar MRI. We will schedule patient for interventional treatment consisting of lumbar epidural steroid injection at time return appointment in attempt to decrease severity of symptoms, minimize progression of symptoms, and avoid the need for more involved treatment. The patient was understanding and agreement with suggested treatment plan.    Review of Systems     Objective:   Physical Exam There was tenderness of the splenius capitis and occipitalis musculature regions of moderate degree. Palpation of the cervical facet cervical paraspinal musculature region was a tends to palpation of mild to moderate degree. There was mild tenderness of the acromioclavicular and glenohumeral joint regions. Tinel's and Phalen's maneuver were without increase of pain of significant degree Palpation of the thoracic facet thoracic paraspinal musculature region was with mild tenderness to palpation with moderate tends to palpation of the lower thoracic paraspinal musculature region. Palpation over the region of the thoracic region was a tenderness to palpation of moderate degree on the left as well as on the right. Palpation over the lumbar paraspinal musculature region lumbar facet region was with moderately severe discomfort. Lateral bending and rotation and extension and palpation of the lumbar facets reproduce moderately severe discomfort. Straight leg raising was limited to approximately 20 without a  definite increased pain with dorsiflexion noted. There was tenderness of the PSIS and PII S region of moderate degree. There was negative clonus negative Homans         Assessment & Plan:   Degenerative disc disease lumbar spine  MRI revealed degenerative changes with prior surgical intervention lumbar region, L5-S1 posterior lumbar interbody fusion, 10 x 12 mm right facet intraspinal synovial cyst, mass effect upon the right lateral thecal sac and mild medial displacement of the S1 nerve root as it abutting the foraminal portion of the L5 nerve root. L4-L5 broad-based disc bulge with moderate bilateral facet arthropathy and mild right foraminal stenosis   Lumbar facet syndrome   Lumbar stenosis with neurogenic claudication   Sacroiliac joint dysfunction   Bilateral occipital neuralgia     Plan  Continue present medications Lyrica and hydrocodone acetaminophen  Lumbar epidural steroid injection to be performed at time return appointment  F/U PCP Dr.Olmedo  for evaliation of  BP and general medical  condition.  F/U surgical evaluation  F/U neurological evaluation  Lumbar MRI scheduled to evaluate for herniated nucleus pulposus, degenerative disc disease, stenosis, and other abnormalities  May consider radiofrequency rhizolysis or intraspinal procedures pending response to present treatment and F/U evaluation.  Patient to call Pain Management Center should patient have concerns prior to scheduled return appointment.

## 2015-05-23 NOTE — Progress Notes (Signed)
Patient states she hurt her bck mowing the lawn last Friday.

## 2015-05-29 ENCOUNTER — Encounter: Payer: Self-pay | Admitting: Pain Medicine

## 2015-05-29 ENCOUNTER — Ambulatory Visit: Attending: Pain Medicine | Admitting: Pain Medicine

## 2015-05-29 VITALS — BP 127/80 | HR 76 | Temp 98.1°F | Resp 14 | Ht 63.0 in | Wt 160.0 lb

## 2015-05-29 DIAGNOSIS — M48062 Spinal stenosis, lumbar region with neurogenic claudication: Secondary | ICD-10-CM | POA: Insufficient documentation

## 2015-05-29 DIAGNOSIS — M47816 Spondylosis without myelopathy or radiculopathy, lumbar region: Secondary | ICD-10-CM

## 2015-05-29 DIAGNOSIS — M1288 Other specific arthropathies, not elsewhere classified, other specified site: Secondary | ICD-10-CM | POA: Insufficient documentation

## 2015-05-29 DIAGNOSIS — M5481 Occipital neuralgia: Secondary | ICD-10-CM

## 2015-05-29 DIAGNOSIS — M961 Postlaminectomy syndrome, not elsewhere classified: Secondary | ICD-10-CM

## 2015-05-29 DIAGNOSIS — M5137 Other intervertebral disc degeneration, lumbosacral region: Secondary | ICD-10-CM

## 2015-05-29 DIAGNOSIS — M51379 Other intervertebral disc degeneration, lumbosacral region without mention of lumbar back pain or lower extremity pain: Secondary | ICD-10-CM

## 2015-05-29 DIAGNOSIS — M5416 Radiculopathy, lumbar region: Secondary | ICD-10-CM

## 2015-05-29 DIAGNOSIS — M5126 Other intervertebral disc displacement, lumbar region: Secondary | ICD-10-CM | POA: Insufficient documentation

## 2015-05-29 DIAGNOSIS — M79604 Pain in right leg: Secondary | ICD-10-CM | POA: Diagnosis present

## 2015-05-29 DIAGNOSIS — M4806 Spinal stenosis, lumbar region: Secondary | ICD-10-CM | POA: Insufficient documentation

## 2015-05-29 DIAGNOSIS — Z981 Arthrodesis status: Secondary | ICD-10-CM | POA: Insufficient documentation

## 2015-05-29 DIAGNOSIS — M545 Low back pain: Secondary | ICD-10-CM | POA: Diagnosis present

## 2015-05-29 DIAGNOSIS — M79605 Pain in left leg: Secondary | ICD-10-CM | POA: Diagnosis present

## 2015-05-29 MED ORDER — ORPHENADRINE CITRATE 30 MG/ML IJ SOLN
INTRAMUSCULAR | Status: AC
  Administered 2015-05-29: 13:00:00
  Filled 2015-05-29: qty 2

## 2015-05-29 MED ORDER — CEFUROXIME AXETIL 250 MG PO TABS: 250.0000 mg | ORAL_TABLET | Freq: Two times a day (BID) | ORAL | Status: DC

## 2015-05-29 MED ORDER — BUPIVACAINE HCL (PF) 0.25 % IJ SOLN
INTRAMUSCULAR | Status: AC
  Administered 2015-05-29: 13:00:00
  Filled 2015-05-29: qty 30

## 2015-05-29 MED ORDER — CEFAZOLIN SODIUM 1 G IJ SOLR
INTRAMUSCULAR | Status: AC
  Administered 2015-05-29: 1 g via INTRAVENOUS
  Filled 2015-05-29: qty 10

## 2015-05-29 MED ORDER — MIDAZOLAM HCL 5 MG/5ML IJ SOLN
INTRAMUSCULAR | Status: AC
  Administered 2015-05-29: 5 mg via INTRAVENOUS
  Filled 2015-05-29: qty 5

## 2015-05-29 MED ORDER — LIDOCAINE-EPINEPHRINE (PF) 1.5 %-1:200000 IJ SOLN
INTRAMUSCULAR | Status: AC
  Filled 2015-05-29: qty 5

## 2015-05-29 MED ORDER — TRIAMCINOLONE ACETONIDE 40 MG/ML IJ SUSP
INTRAMUSCULAR | Status: AC
  Administered 2015-05-29: 13:00:00
  Filled 2015-05-29: qty 1

## 2015-05-29 MED ORDER — FENTANYL CITRATE (PF) 100 MCG/2ML IJ SOLN
INTRAMUSCULAR | Status: AC
  Administered 2015-05-29: 100 ug via INTRAVENOUS
  Filled 2015-05-29: qty 2

## 2015-05-29 MED ORDER — SODIUM CHLORIDE 0.9 % IJ SOLN
INTRAMUSCULAR | Status: AC
  Administered 2015-05-29: 13:00:00
  Filled 2015-05-29: qty 10

## 2015-05-29 NOTE — Progress Notes (Signed)
Safety precautions to be maintained throughout the outpatient stay will include: orient to surroundings, keep bed in low position, maintain call bell within reach at all times, provide assistance with transfer out of bed and ambulation.  

## 2015-05-29 NOTE — Progress Notes (Signed)
   Subjective:    Patient ID: Jennifer [name], female    DOB: 1956/03/15, 59 y.o.   MRN: [id number]  HPI    PROCEDURE PERFORMED: Lumbar epidural steroid injection   NOTE: The patient is a 59 y.o. female who returns to [place] for further evaluation and treatment of pain involving the lumbar and lower extremity region. MRI revealed the patient to be with degenerative changes lumbar spine L5-S1 posterior lumbar interbody fusion, 10 x 12 mm right facet intraspinal synovial cyst, mass effect upon the right lateral thecal sac and mild medial displacement of the S1 nerve root as it is abutting the foraminal portion of the L5 nerve root, L4-5 broad-based disc bulge with moderate bilateral facet arthropathy and mild right foraminal stenosis. The risks, benefits, and expectations of the procedure have been discussed and explained to the patient who was understanding and in agreement with suggested treatment plan. We will proceed with lumbar epidural steroid injection as discussed and as explained to the patient who is willing to proceed with procedure as planned.   DESCRIPTION OF PROCEDURE: Lumbar epidural steroid injection with IV Versed, IV fentanyl conscious sedation, EKG, blood pressure, pulse, and pulse oximetry monitoring. The procedure was performed with the patient in the prone position under fluoroscopic guidance. A local anesthetic skin wheal of 1.5% plain lidocaine was accomplished at proposed entry site. An 18-gauge Tuohy epidural needle was inserted at the L 4 vertebral body level right of the midline via loss-of-resistance technique with negative heme and negative CSF return. A total of 4 mL of Preservative-Free normal saline with 40 mg of Kenalog injected incrementally via epidurally placed needle. Needle was removed.    A total of 40 mg of Kenalog was utilized for the procedure.   The patient tolerated the injection well.    PLAN:   1. Medications: We will continue  presently prescribed medication hydrocodone acetaminophen 2. Will consider modification of treatment regimen pending response to treatment rendered on today's visit and follow-up evaluation. 3. The patient is to follow-up with primary care physician Dr. Kym Groom  regarding blood pressure and general medical condition status post lumbar epidural steroid injection performed on today's visit. 4. Surgical evaluation.. Neurosurgical reevaluation as scheduled. We have also discussed neurological reevaluation  5. The patient may be a candidate for radiofrequency procedures, implantation device, and other treatment pending response to treatment and follow-up evaluation. 6. The patient has been advised to adhere to proper body mechanics and avoid activities which appear to aggravate condition. 7. The patient has been advised to call the Pain Management Center prior to scheduled return appointment should there be significant change in condition or should there be sign  The patient is understanding and agrees with the suggested  treatment plan    Review of Systems     Objective:   Physical Exam        Assessment & Plan:

## 2015-05-29 NOTE — Patient Instructions (Addendum)
Pain Management Discharge Instructions  General Discharge Instructions :  If you need to reach your doctor call: Monday-Friday 8:00 am - 4:00 pm at [phone] or toll free [phone].  After clinic hours [phone] to have operator reach doctor.  Bring all of your medication bottles to all your appointments in the pain clinic.  To cancel or reschedule your appointment with Pain Management please remember to call 24 hours in advance to avoid a fee.  Refer to the educational materials which you have been given on: General Risks, I had my Procedure. Discharge Instructions, Post Sedation.  Post Procedure Instructions:  The drugs you were given will stay in your system until tomorrow, so for the next 24 hours you should not drive, make any legal decisions or drink any alcoholic beverages.  You may eat anything you prefer, but it is better to start with liquids then soups and crackers, and gradually work up to solid foods.  Please notify your doctor immediately if you have any unusual bleeding, trouble breathing or pain that is not related to your normal pain.  Depending on the type of procedure that was done, some parts of your body may feel week and/or numb.  This usually clears up by tonight or the next day.  Walk with the use of an assistive device or accompanied by an adult for the 24 hours.  You may use ice on the affected area for the first 24 hours.  Put ice in a Ziploc bag and cover with a towel and place against area 15 minutes on 15 minutes off.  You may switch to heat after 24 hours.Epidural Steroid Injection Patient Information  Description: The epidural space surrounds the nerves as they exit the spinal cord.  In some patients, the nerves can be compressed and inflamed by a bulging disc or a tight spinal canal (spinal stenosis).  By injecting steroids into the epidural space, we can bring irritated nerves into direct contact with a potentially helpful  medication.  These steroids act directly on the irritated nerves and can reduce swelling and inflammation which often leads to decreased pain.  Epidural steroids may be injected anywhere along the spine and from the neck to the low back depending upon the location of your pain.   After numbing the skin with local anesthetic (like Novocaine), a small needle is passed into the epidural space slowly.  You may experience a sensation of pressure while this is being done.  The entire block usually last less than 10 minutes.  Conditions which may be treated by epidural steroids:   Low back and leg pain  Neck and arm pain  Spinal stenosis  Post-laminectomy syndrome  Herpes zoster (shingles) pain  Pain from compression fractures  Preparation for the injection:  1. Do not eat any solid food or dairy products within 6 hours of your appointment.  2. You may drink clear liquids up to 2 hours before appointment.  Clear liquids include water, black coffee, juice or soda.  No milk or cream please. 3. You may take your regular medication, including pain medications, with a sip of water before your appointment  Diabetics should hold regular insulin (if taken separately) and take 1/2 normal NPH dos the morning of the procedure.  Carry some sugar containing items with you to your appointment. 4. A driver must accompany you and be prepared to drive you home after your procedure.  5. Bring all your current medications with your.  6. An IV may be inserted and sedation may be given at the discretion of the physician.   7. A blood pressure cuff, EKG and other monitors will often be applied during the procedure.  Some patients may need to have extra oxygen administered for a short period. 8. You will be asked to provide medical information, including your allergies, prior to the procedure.  We must know immediately if you are taking blood thinners (like Coumadin/Warfarin)  Or if you are allergic to IV iodine  contrast (dye). We must know if you could possible be pregnant.  Possible side-effects:  Bleeding from needle site  Infection (rare, may require surgery)  Nerve injury (rare)  Numbness & tingling (temporary)  Difficulty urinating (rare, temporary)  Spinal headache ( a headache worse with upright posture)  Light -headedness (temporary)  Pain at injection site (several days)  Decreased blood pressure (temporary)  Weakness in arm/leg (temporary)  Pressure sensation in back/neck (temporary)  Call if you experience:  Fever/chills associated with headache or increased back/neck pain.  Headache worsened by an upright position.  New onset weakness or numbness of an extremity below the injection site  Hives or difficulty breathing (go to the emergency room)  Inflammation or drainage at the infection site  Severe back/neck pain  Any new symptoms which are concerning to you  Please note:  Although the local anesthetic injected can often make your back or neck feel good for several hours after the injection, the pain will likely return.  It takes 3-7 days for steroids to work in the epidural space.  You may not notice any pain relief for at least that one week.  If effective, we will often do a series of three injections spaced 3-6 weeks apart to maximally decrease your pain.  After the initial series, we generally will wait several months before considering a repeat injection of the same type.  If you have any questions, please call [phone]  Continue present medications Lyrica and hydrocodone acetaminophen. Please obtain your Ceftin antibiotic today and begin taking the Ceftin antibiotic today  F/U PCP Dr.Olmedo  for evaliation of  BP and general medical  condition.  F/U surgical evaluation  F/U neurological evaluation  May consider radiofrequency rhizolysis or intraspinal procedures pending response to present treatment and F/U evaluation.  Patient to call  Pain Management Center should patient have concerns prior to scheduled return appointment.      [facility]

## 2015-05-30 ENCOUNTER — Telehealth: Payer: Self-pay | Admitting: *Deleted

## 2015-05-30 NOTE — Telephone Encounter (Signed)
Denies complications post procedure. 

## 2015-06-06 ENCOUNTER — Telehealth: Payer: Self-pay | Admitting: Pain Medicine

## 2015-06-06 NOTE — Telephone Encounter (Signed)
Pt having bad pain , injections did not work. She would like something prescribed to help with pain

## 2015-06-06 NOTE — Telephone Encounter (Signed)
Spole with Dr. Primus Bravo, may come next Monday or Wednesday at [place]. Will come Wednesday.

## 2015-06-06 NOTE — Telephone Encounter (Signed)
[facility], Please inform the patient that she needs to come for an evaluation to have medications modified

## 2015-06-08 ENCOUNTER — Encounter: Admitting: Pain Medicine

## 2015-06-09 ENCOUNTER — Encounter: Payer: Self-pay | Admitting: Cardiovascular Disease

## 2015-06-09 ENCOUNTER — Ambulatory Visit (INDEPENDENT_AMBULATORY_CARE_PROVIDER_SITE_OTHER): Admitting: Cardiovascular Disease

## 2015-06-09 VITALS — BP 118/82 | HR 99 | Ht 63.0 in | Wt 165.5 lb

## 2015-06-09 DIAGNOSIS — I10 Essential (primary) hypertension: Secondary | ICD-10-CM | POA: Diagnosis not present

## 2015-06-09 DIAGNOSIS — E1159 Type 2 diabetes mellitus with other circulatory complications: Secondary | ICD-10-CM

## 2015-06-09 DIAGNOSIS — F329 Major depressive disorder, single episode, unspecified: Secondary | ICD-10-CM

## 2015-06-09 DIAGNOSIS — F418 Other specified anxiety disorders: Secondary | ICD-10-CM | POA: Diagnosis not present

## 2015-06-09 DIAGNOSIS — F32A Depression, unspecified: Secondary | ICD-10-CM

## 2015-06-09 DIAGNOSIS — F419 Anxiety disorder, unspecified: Secondary | ICD-10-CM

## 2015-06-09 DIAGNOSIS — R0789 Other chest pain: Secondary | ICD-10-CM | POA: Diagnosis not present

## 2015-06-09 DIAGNOSIS — E785 Hyperlipidemia, unspecified: Secondary | ICD-10-CM

## 2015-06-09 DIAGNOSIS — I2510 Atherosclerotic heart disease of native coronary artery without angina pectoris: Secondary | ICD-10-CM

## 2015-06-09 MED ORDER — AMLODIPINE BESYLATE 5 MG PO TABS: 5.0000 mg | ORAL_TABLET | Freq: Every day | ORAL | Status: DC

## 2015-06-09 MED ORDER — LISINOPRIL 20 MG PO TABS: 20.0000 mg | ORAL_TABLET | Freq: Every day | ORAL | Status: DC

## 2015-06-09 MED ORDER — NITROGLYCERIN 0.4 MG SL SUBL: 0.4000 mg | SUBLINGUAL_TABLET | SUBLINGUAL | Status: DC | PRN

## 2015-06-09 NOTE — Assessment & Plan Note (Signed)
Continued stress at home. Seen by psychiatry and pain clinic

## 2015-06-09 NOTE — Assessment & Plan Note (Signed)
She is not taking her medications on a regular basis as she is afraid blood pressure is dropping low. We've recommended she decrease the amlodipine down to 5 mg daily Stay on lisinopril at least 20 mg daily She is currently not taking isosorbide mononitrate. We will hold this for now

## 2015-06-09 NOTE — Progress Notes (Signed)
Patient ID: Jennifer [name], female    DOB: 06/07/1956, 59 y.o.   MRN: [id number]  HPI Comments: 59 year old woman with a history of coronary artery disease, PTCA of the LAD with 2.5 x 28 mm Cypher stent in January 2007, Repeat catheterization August 2011 with Stent placement of the mid LAD for 90% lesion,  catheterization August 06, 2011 for chest pain that showed severe mid LAD in-stent restenosis at the site of the previous drug-eluting stent ( DES stent x2 placed in the LAD in 2007 and 2011), also with severe mid RCA disease, moderate to severe proximal diagonal #1 disease, ejection fraction 55%.  hospitalization at [facility] on 12/09/2013  bypass surgery at [facility]  on August 12 2011 by Dr. Roxan Hockey.  She had bypass graft x3 with a LIMA to the LAD, vein graft to the RCA, vein graft to the diagonal.  Last catheterization June 03, 2013 Diabetes typically poorly controlled   problems with Chronic anxiety, stress at home, confusion at times. Treated by a psychiatrist in [place], started on Valium.  She is tired all time, sleeping a good number of hours per day. In the past she has taken benzodiazepines,  Prozac.  previously on Lamictal and Cymbalta,  She sees Dr. Eliberto Ivory, also sees Dr. Leilani Merl in [place]. She does travel up to [place] to help family .    In follow-up today, she reports that she's not been taking some of her medications. Does not take isosorbide, sometimes takes lisinopril once a day, sometimes none per day as blood pressures labile and will drop. Unclear if she is taking her amlodipine, skips some days. Sometimes blood pressure high, other days low. When elevated could be secondary to stress. Denies having significant chest pain. not needing any nitroglycerin   Not doing any regular exercise.  Still sees psychiatry and pain clinic  EKG on today's visit shows normal sinus rhythm with rate 99 bpm, no significant ST or T-wave changes  Other past medical history  cardiac  catheterization 06/03/2013. 100% occluded mid LAD at the site of a stent, mild mid RCA disease, saphenous vein graft to the diagonal and LIMA graft to the LAD are patent. Occluded saphenous vein graft to the distal RCA. Medical management was recommended.  July 08 2012  right-sided chest pain. She ruled out for myocardial infarction by cardiac enzymes.  nuclear stress test which showed no evidence of ischemia with an ejection fraction of 44%. She was noted to be hypertensive with systolic blood pressure of 180.     Allergies  Allergen Reactions  . No Known Allergies     Current Outpatient Prescriptions on File Prior to Visit  Medication Sig Dispense Refill  . albuterol-ipratropium (COMBIVENT) 18-103 MCG/ACT inhaler Inhale into the lungs as needed for wheezing or shortness of breath.    . ALPRAZolam (XANAX) 0.5 MG tablet Take 1 tablet (0.5 mg total) by mouth at bedtime as needed for anxiety. 30 tablet 2  . atorvastatin (LIPITOR) 40 MG tablet Take 1 tablet (40 mg total) by mouth daily. 90 tablet 3  . carvedilol (COREG) 12.5 MG tablet Take 1 tablet (12.5 mg total) by mouth 2 (two) times daily with a meal. 180 tablet 3  . Cholecalciferol (VITAMIN D) 2000 UNITS tablet Take 2,000 Units by mouth daily.    . fenofibrate micronized (LOFIBRA) 134 MG capsule Take 134 mg by mouth daily before breakfast.    . HYDROcodone-acetaminophen (NORCO) 10-325 MG per tablet Limit 1 tab by mouth 3-5 times per  day if tolerated 150 tablet 0  . Insulin Isophane & Regular (HUMULIN 70/30 PEN [place]) Inject 27 Units into the skin 2 (two) times daily.     Marland Kitchen lithium carbonate 300 MG capsule Take 1 capsule by mouth daily.    . metFORMIN (GLUCOPHAGE) 500 MG tablet Take 1,000 mg by mouth 2 (two) times daily with a meal.    . omeprazole (PRILOSEC) 20 MG capsule Take 20 mg by mouth daily.     . pregabalin (LYRICA) 50 MG capsule Limit  one tablet by mouth per day or twice per day if tolerated 40 capsule 0  . QUEtiapine  (SEROQUEL XR) 300 MG 24 hr tablet Take 300 mg by mouth at bedtime. 1 tablets every hs    . rOPINIRole (REQUIP) 2 MG tablet Take 2 mg by mouth at bedtime as needed.     . Vilazodone HCl (VIIBRYD) 10 MG TABS Take 10 mg by mouth daily.    Marland Kitchen zolpidem (AMBIEN) 10 MG tablet Take 1 tablet (10 mg total) by mouth at bedtime. 30 tablet 2   No current facility-administered medications on file prior to visit.    Past Medical History  Diagnosis Date  . GERD (gastroesophageal reflux disease)   . Hyperlipidemia   . Hypertension   . Enlarged liver   . Diabetes mellitus   . Low back pain   . Depression with anxiety   . CAD (coronary artery disease)   . Von Willebrand disease   . Migraines   . Depression   . Persistent disorder of initiating or maintaining sleep   . Generalized anxiety disorder   . Bipolar depression   . Anxiety     Past Surgical History  Procedure Laterality Date  . Cardiac catheterization      CAD,PCI of LAD, Cypher stent 2.5-28mm  . Cardiac catheterization      PCI of mid-LAD in stent restenosis with a drug-eluting stent  . Breast implant removal    . Coronary artery bypass graft   08-12-2011    CABG x 3  . Cardiac catheterization  06/03/13    [facility];MID LAD 100 % STENOSIS; PRIOR STENT; MID RCA 40 % STENOSIS  . Coronary stent placement    . Lumbar fusion      L1-S1  . Abdominal hysterectomy    . Back surgery      Social History  reports that she quit smoking about 4 years ago. Her smoking use included Cigarettes. She quit after 16 years of use. She has never used smokeless tobacco. She reports that she does not drink alcohol or use illicit drugs.  Family History family history includes Alcohol abuse in her brother, father, and mother; Aneurysm in her brother and father; Diabetes in her father, mother, and other; Hyperlipidemia in her other; Hypertension in her brother, father, and other.       Review of Systems  Constitutional: Negative.   Respiratory: Negative.    Cardiovascular: Negative.   Gastrointestinal: Negative.   Musculoskeletal: Positive for back pain.  Skin: Negative.   Allergic/Immunologic: Negative.   Neurological: Negative.   Hematological: Negative.   Psychiatric/Behavioral: Negative.   All other systems reviewed and are negative.  BP 118/82 mmHg  Pulse 99  Ht 5\' 3"  (1.6 m)  Wt 165 lb 8 oz (75.07 kg)  BMI 29.32 kg/m2  Physical Exam  Constitutional: She is oriented to person, place, and time. She appears well-developed and well-nourished.  HENT:  Head: Normocephalic.  Nose: Nose normal.  Mouth/Throat:  Oropharynx is clear and moist.  Eyes: Conjunctivae are normal. Pupils are equal, round, and reactive to light.  Neck: Normal range of motion. Neck supple. No JVD present.  Cardiovascular: Normal rate, regular rhythm, S1 normal, S2 normal, normal heart sounds and intact distal pulses.  Exam reveals no gallop and no friction rub.   No murmur heard. Well-healed mediastinal scar  Pulmonary/Chest: Effort normal and breath sounds normal. No respiratory distress. She has no wheezes. She has no rales. She exhibits no tenderness.  Abdominal: Soft. Bowel sounds are normal. She exhibits no distension. There is no tenderness.  Musculoskeletal: Normal range of motion. She exhibits no edema or tenderness.  Lymphadenopathy:    She has no cervical adenopathy.  Neurological: She is alert and oriented to person, place, and time. Coordination normal.  Skin: Skin is warm and dry. No rash noted. No erythema.  Psychiatric: She has a normal mood and affect. Her behavior is normal. Judgment and thought content normal.    Assessment and Plan  Nursing note and vitals reviewed.

## 2015-06-09 NOTE — Assessment & Plan Note (Signed)
Currently with no symptoms of angina. No further workup at this time.   

## 2015-06-09 NOTE — Assessment & Plan Note (Signed)
Encouraged her to stay on her Lipitor. Goal LDL less than 70. Stressed the importance of better diabetes control

## 2015-06-09 NOTE — Patient Instructions (Addendum)
You are doing well. Please cut the amlodipine in 1/2 daily  Stay on the lisinopril one a day  Please call us if you have new issues that need to be addressed before your next appt.  Your physician wants you to follow-up in: 6 months.  You will receive a reminder letter in the mail two months in advance. If you don't receive a letter, please call our office to schedule the follow-up appointment.

## 2015-06-09 NOTE — Assessment & Plan Note (Signed)
We have encouraged continued exercise, careful diet management in an effort to lose weight. Long history of poor control diabetes

## 2015-06-14 ENCOUNTER — Encounter: Payer: Self-pay | Admitting: Pain Medicine

## 2015-06-14 ENCOUNTER — Ambulatory Visit
Admission: RE | Admit: 2015-06-14 | Discharge: 2015-06-14 | Disposition: A | Discharge status: Home / Self Care | Source: Ambulatory Visit | Attending: Pain Medicine | Admitting: Pain Medicine

## 2015-06-14 ENCOUNTER — Ambulatory Visit: Admitting: Pain Medicine

## 2015-06-14 VITALS — BP 141/74 | HR 92 | Temp 97.5°F | Resp 16 | Ht 63.0 in | Wt 160.0 lb

## 2015-06-14 DIAGNOSIS — M5030 Other cervical disc degeneration, unspecified cervical region: Secondary | ICD-10-CM

## 2015-06-14 DIAGNOSIS — M47816 Spondylosis without myelopathy or radiculopathy, lumbar region: Secondary | ICD-10-CM

## 2015-06-14 DIAGNOSIS — M5416 Radiculopathy, lumbar region: Secondary | ICD-10-CM

## 2015-06-14 DIAGNOSIS — M5481 Occipital neuralgia: Secondary | ICD-10-CM

## 2015-06-14 DIAGNOSIS — M5126 Other intervertebral disc displacement, lumbar region: Secondary | ICD-10-CM | POA: Insufficient documentation

## 2015-06-14 DIAGNOSIS — M51379 Other intervertebral disc degeneration, lumbosacral region without mention of lumbar back pain or lower extremity pain: Secondary | ICD-10-CM

## 2015-06-14 DIAGNOSIS — M4806 Spinal stenosis, lumbar region: Secondary | ICD-10-CM | POA: Diagnosis not present

## 2015-06-14 DIAGNOSIS — M533 Sacrococcygeal disorders, not elsewhere classified: Secondary | ICD-10-CM | POA: Insufficient documentation

## 2015-06-14 DIAGNOSIS — M545 Low back pain: Secondary | ICD-10-CM | POA: Diagnosis present

## 2015-06-14 DIAGNOSIS — M5137 Other intervertebral disc degeneration, lumbosacral region: Secondary | ICD-10-CM

## 2015-06-14 DIAGNOSIS — M48062 Spinal stenosis, lumbar region with neurogenic claudication: Secondary | ICD-10-CM

## 2015-06-14 DIAGNOSIS — M5136 Other intervertebral disc degeneration, lumbar region: Secondary | ICD-10-CM | POA: Diagnosis not present

## 2015-06-14 DIAGNOSIS — Z981 Arthrodesis status: Secondary | ICD-10-CM | POA: Insufficient documentation

## 2015-06-14 DIAGNOSIS — M961 Postlaminectomy syndrome, not elsewhere classified: Secondary | ICD-10-CM

## 2015-06-14 MED ORDER — OXYCODONE HCL 5 MG PO TABS: ORAL_TABLET | ORAL | Status: DC

## 2015-06-14 MED ORDER — PREGABALIN 50 MG PO CAPS: ORAL_CAPSULE | ORAL | Status: DC

## 2015-06-14 NOTE — Progress Notes (Signed)
   Subjective:    Patient ID: Jennifer [name], female    DOB: 1956/07/25, 59 y.o.   MRN: [id number]  HPI  Patient is 59 year old female returns to [place] for further evaluation and treatment of pain involving the lower back and lower extremity regions. Patient states that the pain involves the lower back and lower extremity on the left especially. 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 Patient states that the pain radiates from the back to the foot. At the present time patient was undergo lumbar MRI. We will proceed with interventional treatment pending results of lumbar MRI as discussed with patient and will just patient's medications on today's visit. We will replace the hydrocodone acetaminophen with oxycodone and patient will continue Lyrica provided without undesirable side effects   Review of Systems     Objective:   Physical Exam  There was tends to palpation of the splenius capitis and occipitalis musculature region. Palpation over the region of the cervical facet cervical paraspinal musculature region reproduced mild discomfort. There was mild tenderness of the acromioclavicular and glenohumeral joint regions. She appeared to be with bilaterally equal grip strength. Tinel and Phalen's maneuver without increase of pain significant degree. There was unremarkable Spurling's maneuver. Palpation over the lumbar paraspinal muscles region lumbar facet region was increased pain with extension and palpation over the lumbar facets. There was tenderness to palpation over the PSIS PII S region as well as the gluteal and piriformis musculature regions. Straight leg raising was tolerated to approximately 20 without a definite increase of pain with dorsiflexion. Patient was with tenderness to palpation of the gluteal and piriformis musculature regions of moderate degree on the left mild degree on the right. There was negative clonus and negative Homans. DTRs were  difficult to elicit patient had difficulty relaxing. The abdomen was soft nontender and no costovertebral angle tenderness was noted.      Assessment & Plan:    Degenerative disc disease lumbar spine  MRI revealed degenerative changes with prior surgical intervention lumbar region, L5-S1 posterior lumbar interbody fusion, 10 x 12 mm right facet intraspinal synovial cyst, mass effect upon the right lateral thecal sac and mild medial displacement of the S1 nerve root as it abutting the foraminal portion of the L5 nerve root. L4-L5 broad-based disc bulge with moderate bilateral facet arthropathy and mild right foraminal stenosis   Lumbar stenosis with neurogenic claudication  Lumbar radiculopathy  Lumbar facet syndrome   Lumbar stenosis with neurogenic claudication   Sacroiliac joint dysfunction    Plan  Continue present medication Lyrica and hydrocodone acetaminophen was replaced with oxycodone. Patient with caution regarding respiratory depression confusion and other side effects which can occur with these medications  Lumbar epidural steroid injection to be performed at time of return appointment  F/U PCP Dr. Kym Groom  for evaliation of  BP and general medical  Condition  Lumbar MRI as scheduled to evaluate for abnormalities of the spine which may be contributing to patient's lumbar and lower extremity symptoms  F/U surgical evaluation  F/U neurological evaluation  May consider radiofrequency rhizolysis or intraspinal procedures pending response to present treatment and F/U evaluation   Patient to call Pain Management Center should patient have concerns prior to scheduled return appointmen.

## 2015-06-14 NOTE — Patient Instructions (Addendum)
Continue present medication  Lyrica and stop hydrocodone acetaminophen. Begin oxycodone. Caution oxycodone is stronger than the hydrocodone and may cause drowsiness, confusion, and other side effects  . Standard present of an adult driver for the first few days while taking the oxycodone and go to emergency room immediately if you develop any side effects  F/U PCP Dr.Olmedo  for evaliation of  BP and general medical  Condition  Lumbar epidural steroid injection to be performed at time of return appointment  F/U surgical evaluation as discussed  Lumbar MRI as planned  F/U neurological evaluation  May consider radiofrequency rhizolysis or intraspinal procedures pending response to present treatment and F/U evaluation   Patient to call Pain Management Center should patient have concerns prior to scheduled return appointmen.  Do not take Hydrocodone while you are taking Oxycodone

## 2015-06-14 NOTE — Progress Notes (Signed)
Safety precautions to be maintained throughout the outpatient stay will include: orient to surroundings, keep bed in low position, maintain call bell within reach at all times, provide assistance with transfer out of bed and ambulation.  

## 2015-06-15 NOTE — Progress Notes (Signed)
Patient advised of MRI results, agrees to see neurosurgeon.

## 2015-06-19 ENCOUNTER — Encounter: Payer: Self-pay | Admitting: Pain Medicine

## 2015-06-19 ENCOUNTER — Ambulatory Visit: Attending: Pain Medicine | Admitting: Pain Medicine

## 2015-06-19 ENCOUNTER — Ambulatory Visit: Admitting: Pain Medicine

## 2015-06-19 VITALS — BP 133/63 | HR 83 | Temp 98.7°F | Resp 14 | Ht 63.0 in | Wt 160.0 lb

## 2015-06-19 DIAGNOSIS — M51379 Other intervertebral disc degeneration, lumbosacral region without mention of lumbar back pain or lower extremity pain: Secondary | ICD-10-CM

## 2015-06-19 DIAGNOSIS — M5137 Other intervertebral disc degeneration, lumbosacral region: Secondary | ICD-10-CM

## 2015-06-19 DIAGNOSIS — M79605 Pain in left leg: Secondary | ICD-10-CM | POA: Diagnosis present

## 2015-06-19 DIAGNOSIS — M5481 Occipital neuralgia: Secondary | ICD-10-CM

## 2015-06-19 DIAGNOSIS — M961 Postlaminectomy syndrome, not elsewhere classified: Secondary | ICD-10-CM

## 2015-06-19 DIAGNOSIS — M48062 Spinal stenosis, lumbar region with neurogenic claudication: Secondary | ICD-10-CM

## 2015-06-19 DIAGNOSIS — M5030 Other cervical disc degeneration, unspecified cervical region: Secondary | ICD-10-CM

## 2015-06-19 DIAGNOSIS — M1288 Other specific arthropathies, not elsewhere classified, other specified site: Secondary | ICD-10-CM | POA: Insufficient documentation

## 2015-06-19 DIAGNOSIS — M545 Low back pain: Secondary | ICD-10-CM | POA: Diagnosis present

## 2015-06-19 DIAGNOSIS — M47816 Spondylosis without myelopathy or radiculopathy, lumbar region: Secondary | ICD-10-CM | POA: Diagnosis not present

## 2015-06-19 DIAGNOSIS — M79604 Pain in right leg: Secondary | ICD-10-CM | POA: Diagnosis present

## 2015-06-19 DIAGNOSIS — M5416 Radiculopathy, lumbar region: Secondary | ICD-10-CM

## 2015-06-19 MED ORDER — CEFAZOLIN SODIUM 1 G IJ SOLR
INTRAMUSCULAR | Status: AC
  Administered 2015-06-19: 1 g via INTRAVENOUS
  Filled 2015-06-19: qty 10

## 2015-06-19 MED ORDER — TRIAMCINOLONE ACETONIDE 40 MG/ML IJ SUSP
INTRAMUSCULAR | Status: AC
  Administered 2015-06-19: 40 mg
  Filled 2015-06-19: qty 1

## 2015-06-19 MED ORDER — LIDOCAINE HCL (PF) 1 % IJ SOLN
INTRAMUSCULAR | Status: AC
  Administered 2015-06-19: 5 mL
  Filled 2015-06-19: qty 5

## 2015-06-19 MED ORDER — MIDAZOLAM HCL 5 MG/5ML IJ SOLN
INTRAMUSCULAR | Status: AC
  Administered 2015-06-19: 3 mg via INTRAVENOUS
  Filled 2015-06-19: qty 5

## 2015-06-19 MED ORDER — CEFUROXIME AXETIL 250 MG PO TABS: 250.0000 mg | ORAL_TABLET | Freq: Two times a day (BID) | ORAL | Status: DC

## 2015-06-19 MED ORDER — FENTANYL CITRATE (PF) 100 MCG/2ML IJ SOLN
INTRAMUSCULAR | Status: AC
  Administered 2015-06-19: 100 ug via INTRAVENOUS
  Filled 2015-06-19: qty 2

## 2015-06-19 MED ORDER — SODIUM CHLORIDE 0.9 % IJ SOLN
INTRAMUSCULAR | Status: AC
  Administered 2015-06-19: 4 mL
  Filled 2015-06-19: qty 20

## 2015-06-19 MED ORDER — BUPIVACAINE HCL (PF) 0.25 % IJ SOLN
INTRAMUSCULAR | Status: AC
  Filled 2015-06-19: qty 30

## 2015-06-19 MED ORDER — ORPHENADRINE CITRATE 30 MG/ML IJ SOLN
INTRAMUSCULAR | Status: AC
  Filled 2015-06-19: qty 2

## 2015-06-19 NOTE — Progress Notes (Signed)
   Subjective:    Patient ID: Jennifer [name], female    DOB: 03-23-1956, 59 y.o.   MRN: [id number]  HPI  PROCEDURE PERFORMED: Lumbar epidural steroid injection   NOTE: The patient is a 59 y.o. female who returns to [place] for further evaluation and treatment of pain involving the lumbar and lower extremity region.  MRI revealed the patient to be with  Multilevel degenerative changes of the lumbar spine with right-sided synovial cyst and facet arthropathy causing moderate impingement at the L5-S1 level. Disc bulge and facet arthropathy calls mild impingement at L4-5. The risks, benefits, and expectations of the procedure have been discussed and explained to the patient who was understanding and in agreement with suggested treatment plan. We will proceed with lumbar epidural steroid injection as discussed and as explained to the patient who is willing to proceed with procedure as planned.   DESCRIPTION OF PROCEDURE: Lumbar epidural steroid injection with IV Versed, IV fentanyl conscious sedation, EKG, blood pressure, pulse, and pulse oximetry monitoring. The procedure was performed with the patient in the prone position under fluoroscopic guidance. A local anesthetic skin wheal of 1.5% plain lidocaine was accomplished at proposed entry site. An 18-gauge Tuohy epidural needle was inserted at the L  3 vertebral body level  right of the midline via loss-of-resistance technique with negative heme and negative CSF return. A total of 4 mL of Preservative-Free normal saline with 40 mg of Kenalog injected incrementally via epidurally placed needle. Needle was removed   Myoneural block injections of the lumbar paraspinal musculature region.  Following Betadine prep of proposed entry site a 22-gauge needle was inserted in the lumbar paraspinal musculature region and following negative aspiration 2 cc of 0.25% bupivacaine with Norflex was injected into the lumbar  paraspinal musculature region  4    A total of 40 mg of Kenalog was utilized for the procedure.   The patient tolerated the injection well.    PLAN:   1. Medications: We will continue presently prescribed medications. Lyrica and oxycodone 2. Will consider modification of treatment regimen pending response to treatment rendered on today's visit and follow-up evaluation. 3. The patient is to follow-up with primary care physician Dr.Olmedo regarding blood pressure and general medical condition status post lumbar epidural steroid injection performed on today's visit. 4. Surgical evaluation  as planned 5. Neurological evaluation. 6. The patient may be a candidate for radiofrequency procedures, implantation device, and other treatment pending response to treatment and follow-up evaluation. 7. The patient has been advised to adhere to proper body mechanics and avoid activities which appear to aggravate condition. 8. The patient has been advised to call the Pain Management Center prior to scheduled return appointment should there be significant change in condition or should there be sign  The patient is understanding and agrees with the suggested  treatment plan     Review of Systems     Objective:   Physical Exam        Assessment & Plan:

## 2015-06-19 NOTE — Patient Instructions (Addendum)
Continue present medications Lyrica and oxycodone  Please obtain your Ceftin antibiotic today and begin taking Ceftin antibiotic today  F/U PCP Dr.Olmedo  for evaliation of  BP and general medical  condition  F/U surgical evaluatio as planned  F/U neurological evaluation  May consider radiofrequency rhizolysis or intraspinal procedures pending response to present treatment and F/U evaluation   Patient to call Pain Management Center should patient have concerns prior to scheduled return appointmen. Pain Management Discharge Instructions  General Discharge Instructions :  If you need to reach your doctor call: Monday-Friday 8:00 am - 4:00 pm at [phone] or toll free [phone].  After clinic hours [phone] to have operator reach doctor.  Bring all of your medication bottles to all your appointments in the pain clinic.  To cancel or reschedule your appointment with Pain Management please remember to call 24 hours in advance to avoid a fee.  Refer to the educational materials which you have been given on: General Risks, I had my Procedure. Discharge Instructions, Post Sedation.  Post Procedure Instructions:  The drugs you were given will stay in your system until tomorrow, so for the next 24 hours you should not drive, make any legal decisions or drink any alcoholic beverages.  You may eat anything you prefer, but it is better to start with liquids then soups and crackers, and gradually work up to solid foods.  Please notify your doctor immediately if you have any unusual bleeding, trouble breathing or pain that is not related to your normal pain.  Depending on the type of procedure that was done, some parts of your body may feel week and/or numb.  This usually clears up by tonight or the next day.  Walk with the use of an assistive device or accompanied by an adult for the 24 hours.  You may use ice on the affected area for the first 24 hours.  Put ice in a Ziploc bag and  cover with a towel and place against area 15 minutes on 15 minutes off.  You may switch to heat after 24 hours.

## 2015-06-19 NOTE — Progress Notes (Signed)
Safety precautions to be maintained throughout the outpatient stay will include: orient to surroundings, keep bed in low position, maintain call bell within reach at all times, provide assistance with transfer out of bed and ambulation.  

## 2015-06-20 ENCOUNTER — Telehealth: Payer: Self-pay | Admitting: *Deleted

## 2015-06-20 NOTE — Telephone Encounter (Signed)
No problems post procedure per family member.

## 2015-06-29 ENCOUNTER — Encounter: Payer: Self-pay | Admitting: Pain Medicine

## 2015-06-29 ENCOUNTER — Ambulatory Visit: Attending: Pain Medicine | Admitting: Pain Medicine

## 2015-06-29 ENCOUNTER — Ambulatory Visit: Admitting: Pain Medicine

## 2015-06-29 VITALS — BP 142/73 | HR 82 | Temp 98.5°F | Resp 18 | Ht 63.0 in | Wt 160.0 lb

## 2015-06-29 DIAGNOSIS — M5137 Other intervertebral disc degeneration, lumbosacral region: Secondary | ICD-10-CM

## 2015-06-29 DIAGNOSIS — M47816 Spondylosis without myelopathy or radiculopathy, lumbar region: Secondary | ICD-10-CM

## 2015-06-29 DIAGNOSIS — M47896 Other spondylosis, lumbar region: Secondary | ICD-10-CM | POA: Insufficient documentation

## 2015-06-29 DIAGNOSIS — M533 Sacrococcygeal disorders, not elsewhere classified: Secondary | ICD-10-CM | POA: Diagnosis not present

## 2015-06-29 DIAGNOSIS — F3132 Bipolar disorder, current episode depressed, moderate: Secondary | ICD-10-CM

## 2015-06-29 DIAGNOSIS — M5416 Radiculopathy, lumbar region: Secondary | ICD-10-CM

## 2015-06-29 DIAGNOSIS — M4806 Spinal stenosis, lumbar region: Secondary | ICD-10-CM | POA: Diagnosis not present

## 2015-06-29 DIAGNOSIS — M5126 Other intervertebral disc displacement, lumbar region: Secondary | ICD-10-CM | POA: Diagnosis not present

## 2015-06-29 DIAGNOSIS — M79605 Pain in left leg: Secondary | ICD-10-CM | POA: Diagnosis present

## 2015-06-29 DIAGNOSIS — M545 Low back pain: Secondary | ICD-10-CM | POA: Diagnosis present

## 2015-06-29 DIAGNOSIS — M5030 Other cervical disc degeneration, unspecified cervical region: Secondary | ICD-10-CM

## 2015-06-29 DIAGNOSIS — M5136 Other intervertebral disc degeneration, lumbar region: Secondary | ICD-10-CM | POA: Insufficient documentation

## 2015-06-29 DIAGNOSIS — M79604 Pain in right leg: Secondary | ICD-10-CM | POA: Diagnosis present

## 2015-06-29 DIAGNOSIS — M961 Postlaminectomy syndrome, not elsewhere classified: Secondary | ICD-10-CM

## 2015-06-29 DIAGNOSIS — M51379 Other intervertebral disc degeneration, lumbosacral region without mention of lumbar back pain or lower extremity pain: Secondary | ICD-10-CM

## 2015-06-29 DIAGNOSIS — M5481 Occipital neuralgia: Secondary | ICD-10-CM

## 2015-06-29 DIAGNOSIS — M48062 Spinal stenosis, lumbar region with neurogenic claudication: Secondary | ICD-10-CM

## 2015-06-29 MED ORDER — PREGABALIN 50 MG PO CAPS: ORAL_CAPSULE | ORAL | Status: DC

## 2015-06-29 MED ORDER — HYDROCODONE-ACETAMINOPHEN 10-325 MG/15ML PO SOLN: ORAL | Status: DC

## 2015-06-29 MED ORDER — HYDROCODONE-ACETAMINOPHEN 10-325 MG PO TABS: ORAL_TABLET | ORAL | Status: DC

## 2015-06-29 NOTE — Progress Notes (Signed)
Safety precautions to be maintained throughout the outpatient stay will include: orient to surroundings, keep bed in low position, maintain call bell within reach at all times, provide assistance with transfer out of bed and ambulation. Mri done on lumbar since last visit

## 2015-06-29 NOTE — Progress Notes (Signed)
   Subjective:    Patient ID: Jennifer [name], female    DOB: 11/30/1955, 59 y.o.   MRN: [id number]  HPI  Patient is 59 year old female returns to [place] for further evaluation and treatment of pain involving the lower back lower extremity region. Patient with pain involving the neck of lesser degree. Patient has had pain of the lower back and lower extremity region aggravated by prolonged standing and walking. Patient denies any trauma change in events of daily living the call change in symptomatology. We discussed patient's condition and will proceed with interventional treatment consisting of lumbosacral selective nerve root block at time return appointment in attempt to decrease severity of patient's symptoms, minimize progression of patient's symptoms, and avoid the need for more involved treatment. The patient was with understanding and in agreement status treatment plan. We will stop oxycodone and patient will resume hydrocodone acetaminophen and will continue Lyrica. The patient was in agreement with suggested treatment plan    Review of Systems     Objective:   Physical Exam  There was tends to palpation splenius capitis and occipitalis musculature region of mild to moderate degree. There was mild to moderate tenderness of the cervical facet cervical paraspinal musculature region. Patient appeared to be with bilaterally equal grip strength. Tinel and Phalen's maneuver without increased pain of significant degree. There was tends to palpation over the region of the thoracic facet thoracic paraspinal musculature region of mild degree. Lateral bending and rotation and extension and palpation of the lumbar facets reproduce moderate discomfort. There was moderate tends to palpation of the lower thoracic paraspinal musculature region with evidence of moderate muscle spasms in the lower thoracic paraspinal musculature region. Palpation over the PSIS and PII S region reproduced  moderate discomfort. There was mild tends of the greater trochanteric region and iliotibial band region. Straight leg raising was tolerated to 30 without a definite increased pain with dorsiflexion noted. Patient with difficulty attempt to stand on tiptoes and heels. Clonus negative Homans. DTRs were difficult to elicit patient had difficulty relaxing. Abdomen nontender and no costovertebral maintenance noted.      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 L4-L5 broad-based disc bulge with moderate bilateral facet arthropathy and mild right foraminal stenosis   Lumbar stenosis with neurogenic claudication  Lumbar radiculopathy  Lumbar facet syndrome   Lumbar stenosis with neurogenic claudication   Sacroiliac joint dysfunction   PLAN   Continue present medications including Lyrica and stop oxycodone patient was given prescription for hydrocodone acetaminophen 10/325.  Lumbosacral selective nerve block to be performed at time return appointment as discussed  F/U PCP Dr.Olmedo  for evaliation of  BP and general medical  condition  F/U surgical evaluation as planned  F/U neurological evaluation. May consider pending follow-up evaluations  May consider radiofrequency rhizolysis or intraspinal procedures pending response to present treatment and F/U evaluation   Patient to call Pain Management Center should patient have concerns prior to scheduled return appointment.

## 2015-06-29 NOTE — Patient Instructions (Addendum)
Continue present medication Lyrica. Stop oxycodone and begin hydrocodone acetaminophen. Caution hydrocodone acetaminophen can cause excessive drowsiness confusion respiratory depression and other side effects. Exercise extreme caution when taking these medications  Lumbosacral selective nerve root block to be performed at time return appointment  F/U PCP Dr.Olmedo  for evaliation of  BP and general medical  condition  F/U surgical evaluation. May consider pending follow-up evaluations  F/U neurological evaluation. May consider pending follow-up evaluations  May consider radiofrequency rhizolysis or intraspinal procedures pending response to present treatment and F/U evaluation   Patient to call Pain Management Center should patient have concerns prior to scheduled return appointment.

## 2015-07-06 ENCOUNTER — Encounter: Admitting: Pain Medicine

## 2015-07-11 ENCOUNTER — Encounter: Admitting: Pain Medicine

## 2015-07-17 ENCOUNTER — Ambulatory Visit: Admitting: Pain Medicine

## 2015-07-19 ENCOUNTER — Ambulatory Visit: Admitting: Pain Medicine

## 2015-07-24 ENCOUNTER — Ambulatory Visit: Attending: Pain Medicine | Admitting: Pain Medicine

## 2015-07-24 ENCOUNTER — Encounter: Payer: Self-pay | Admitting: Pain Medicine

## 2015-07-24 VITALS — BP 120/92 | HR 86 | Temp 98.5°F | Resp 18 | Ht 63.0 in | Wt 160.0 lb

## 2015-07-24 DIAGNOSIS — M79604 Pain in right leg: Secondary | ICD-10-CM | POA: Diagnosis present

## 2015-07-24 DIAGNOSIS — M79605 Pain in left leg: Secondary | ICD-10-CM | POA: Diagnosis present

## 2015-07-24 DIAGNOSIS — M48062 Spinal stenosis, lumbar region with neurogenic claudication: Secondary | ICD-10-CM

## 2015-07-24 DIAGNOSIS — M545 Low back pain: Secondary | ICD-10-CM | POA: Diagnosis present

## 2015-07-24 DIAGNOSIS — M47816 Spondylosis without myelopathy or radiculopathy, lumbar region: Secondary | ICD-10-CM

## 2015-07-24 DIAGNOSIS — M5137 Other intervertebral disc degeneration, lumbosacral region: Secondary | ICD-10-CM

## 2015-07-24 DIAGNOSIS — M5416 Radiculopathy, lumbar region: Secondary | ICD-10-CM

## 2015-07-24 DIAGNOSIS — F3132 Bipolar disorder, current episode depressed, moderate: Secondary | ICD-10-CM

## 2015-07-24 DIAGNOSIS — M5030 Other cervical disc degeneration, unspecified cervical region: Secondary | ICD-10-CM

## 2015-07-24 DIAGNOSIS — M5126 Other intervertebral disc displacement, lumbar region: Secondary | ICD-10-CM | POA: Diagnosis not present

## 2015-07-24 DIAGNOSIS — M1288 Other specific arthropathies, not elsewhere classified, other specified site: Secondary | ICD-10-CM | POA: Insufficient documentation

## 2015-07-24 DIAGNOSIS — M5481 Occipital neuralgia: Secondary | ICD-10-CM

## 2015-07-24 DIAGNOSIS — M797 Fibromyalgia: Secondary | ICD-10-CM

## 2015-07-24 DIAGNOSIS — M961 Postlaminectomy syndrome, not elsewhere classified: Secondary | ICD-10-CM

## 2015-07-24 MED ORDER — CEFUROXIME AXETIL 250 MG PO TABS: 250.0000 mg | ORAL_TABLET | Freq: Two times a day (BID) | ORAL | Status: DC

## 2015-07-24 MED ORDER — MIDAZOLAM HCL 5 MG/5ML IJ SOLN
INTRAMUSCULAR | Status: AC
  Administered 2015-07-24: 4 mg via INTRAVENOUS
  Filled 2015-07-24: qty 5

## 2015-07-24 MED ORDER — CEFAZOLIN SODIUM 1 G IJ SOLR
INTRAMUSCULAR | Status: AC
  Administered 2015-07-24: 1 g via INTRAVENOUS
  Filled 2015-07-24: qty 10

## 2015-07-24 MED ORDER — BUPIVACAINE HCL (PF) 0.25 % IJ SOLN
INTRAMUSCULAR | Status: AC
  Administered 2015-07-24: 30 mL
  Filled 2015-07-24: qty 30

## 2015-07-24 MED ORDER — HYDROCODONE-ACETAMINOPHEN 10-325 MG PO TABS: ORAL_TABLET | ORAL | Status: DC

## 2015-07-24 MED ORDER — TRIAMCINOLONE ACETONIDE 40 MG/ML IJ SUSP
INTRAMUSCULAR | Status: AC
  Administered 2015-07-24: 10 mg
  Filled 2015-07-24: qty 1

## 2015-07-24 MED ORDER — ORPHENADRINE CITRATE 30 MG/ML IJ SOLN
INTRAMUSCULAR | Status: AC
  Administered 2015-07-24: 60 mg
  Filled 2015-07-24: qty 2

## 2015-07-24 MED ORDER — PREGABALIN 50 MG PO CAPS: ORAL_CAPSULE | ORAL | Status: DC

## 2015-07-24 MED ORDER — FENTANYL CITRATE (PF) 100 MCG/2ML IJ SOLN
INTRAMUSCULAR | Status: AC
  Administered 2015-07-24: 100 ug via INTRAVENOUS
  Filled 2015-07-24: qty 2

## 2015-07-24 NOTE — Progress Notes (Signed)
Subjective:    Patient ID: Jennifer [name], female    DOB: 1955/11/07, 59 y.o.   MRN: [id number]  HPI  PROCEDURE PERFORMED: Lumbosacral selective nerve root block   NOTE: The patient is a 59 y.o. female who returns to [place] for further evaluation and treatment of pain involving the lumbar and lower extremity region. Studies consisting of MRI has revealed the patient to be with evidence of right-sided synovial cyst with facet arthropathy causing moderate impingement at L5-S1 with disc bulging and facet arthropathy with mild impingement L4-5. There is concern regarding patient's pain being due to radicular component There is concern regarding intraspinal abnormalities contributing to the patient's symptomatology. The risks, benefits, and expectations of the procedure have been explained to the patient who was understanding and in agreement with suggested treatment plan. We will proceed with interventional treatment as discussed and as explained to the patient. The patient is understanding and in agreement with suggested treatment plan.   DESCRIPTION OF PROCEDURE: Lumbosacral selective nerve root block with IV Versed, IV fentanyl conscious sedation, EKG, blood pressure, pulse, and pulse oximetry monitoring. The procedure was performed with the patient in the prone position under fluoroscopic guidance. With the patient in the prone position, Betadine prep of proposed entry site was performed. Local anesthetic skin wheal of proposed needle entry site was prepared with 1.5% plain lidocaine with AP view of the lumbosacral spine.   PROCEDURE #1: Needle placement at the right L 2 vertebral body: A 22 -gauge needle was inserted at the inferior border of the transverse process of the vertebral body with needle placed medial to the midline of the transverse process on AP view of the lumbosacral spine.   NEEDLE PLACEMENT AT  L3, L4, and L5  VERTEBRAL BODY LEVELS  Needle  placement was  accomplished at L3, L4, and L5  vertebral body levels on the right side exactly as was accomplished at the L2  vertebral body level  and utilizing the same technique and under fluoroscopic guidance.  Needle placement was then verified on lateral view at all levels with needle tip documented to be in the posterior superior quadrant of the intervertebral foramen of  L 2, L3, L4, and L5. Following negative aspiration for heme and CSF at each level, each level was injected with 3 mL of 0.25% bupivacaine with Kenalog.   Myoneural block injections of the lumbar paraspinal musculature region Following Betadine prep of proposed entry site a 22-gauge needle was inserted in the lumbar musculature region and following negative aspiration 2 cc of 0.25% bupivacaine with Norflex was injected for myoneural block injection 4   The patient tolerated the procedure well. A total of 10 mg of Kenalog was utilized for the procedure.   PLAN:  1. Medications: Will continue presently prescribed medications. Lyrica and hydrocodone acetaminophen 2. The patient is to undergo follow-up evaluation with PCP Dr.Olmedo  for evaluation of blood pressure and general medical condition status post procedure performed on today's visit. 3. Surgical follow-up evaluation. 4. Neurological evaluation. 5. May consider radiofrequency procedures, implantation type procedures and other treatment pending response to treatment and follow-up evaluation. 6. The patient has been advise do adhere to proper body mechanics and avoid activities which may aggravate condition. 7. The patient has been advised to call the Pain Management Center prior to scheduled return appointment should there be significant change in the patient's condition or should the patient have other concerns regarding condition prior to scheduled return appointment.   Review  of Systems     Objective:   Physical Exam        Assessment & Plan:

## 2015-07-24 NOTE — Progress Notes (Signed)
Safety precautions to be maintained throughout the outpatient stay will include: orient to surroundings, keep bed in low position, maintain call bell within reach at all times, provide assistance with transfer out of bed and ambulation.  

## 2015-07-24 NOTE — Patient Instructions (Addendum)
PLAN  Continue present medication Lyrica and hydrocodone acetaminophen and begin taking antibiotic Ceftin as prescribed. Please obtain your antibiotic today and begin taking antibiotic Ceftin today  F/U PCP Dr.Olmedo  for evaliation of  BP and general medical  condition.  F/U surgical evaluation.   F/U neurological evaluation. May consider pending follow-up evaluations  May consider radiofrequency rhizolysis or intraspinal procedures pending response to present treatment and F/U evaluation.  Patient to call Pain Management Center should patient have concerns prior to scheduled return appointment.  Pain Management Discharge Instructions  General Discharge Instructions :  If you need to reach your doctor call: Monday-Friday 8:00 am - 4:00 pm at [phone] or toll free [phone].  After clinic hours [phone] to have operator reach doctor.  Bring all of your medication bottles to all your appointments in the pain clinic.  To cancel or reschedule your appointment with Pain Management please remember to call 24 hours in advance to avoid a fee.  Refer to the educational materials which you have been given on: General Risks, I had my Procedure. Discharge Instructions, Post Sedation.  Post Procedure Instructions:  The drugs you were given will stay in your system until tomorrow, so for the next 24 hours you should not drive, make any legal decisions or drink any alcoholic beverages.  You may eat anything you prefer, but it is better to start with liquids then soups and crackers, and gradually work up to solid foods.  Please notify your doctor immediately if you have any unusual bleeding, trouble breathing or pain that is not related to your normal pain.  Depending on the type of procedure that was done, some parts of your body may feel week and/or numb.  This usually clears up by tonight or the next day.  Walk with the use of an assistive device or accompanied by an adult for the  24 hours.  You may use ice on the affected area for the first 24 hours.  Put ice in a Ziploc bag and cover with a towel and place against area 15 minutes on 15 minutes off.  You may switch to heat after 24 hours.Selective Nerve Root Block Patient Information  Description: Specific nerve roots exit the spinal canal and these nerves can be compressed and inflamed by a bulging disc and bone spurs.  By injecting steroids on the nerve root, we can potentially decrease the inflammation surrounding these nerves, which often leads to decreased pain.  Also, by injecting local anesthesia on the nerve root, this can provide [place] helpful information to give to your referring doctor if it decreases your pain.  Selective nerve root blocks can be done along the spine from the neck to the low back depending on the location of your pain.   After numbing the skin with local anesthesia, a small needle is passed to the nerve root and the position of the needle is verified using x-ray pictures.  After the needle is in correct position, we then deposit the medication.  You may experience a pressure sensation while this is being done.  The entire block usually lasts less than 15 minutes.  Conditions that may be treated with selective nerve root blocks:  Low back and leg pain  Spinal stenosis  Diagnostic block prior to potential surgery  Neck and arm pain  Post laminectomy syndrome  Preparation for the injection:  1. Do not eat any solid food or dairy products within 6 hours of your appointment. 2. You may drink clear  liquids up to 2 hours before an appointment.  Clear liquids include water, black coffee, juice or soda.  No milk or cream please. 3. You may take your regular medications, including pain medications, with a sip of water before your appointment.  Diabetics should hold regular insulin (if taken separately) and take 1/2 normal NPH dose the morning of the procedure.  Carry some sugar containing items with  you to your appointment. 4. A driver must accompany you and be prepared to drive you home after your procedure. 5. Bring all your current medications with you. 6. An IV may be inserted and sedation may be given at the discretion of the physician. 7. A blood pressure cuff, EKG, and other monitors will often be applied during the procedure.  Some patients may need to have extra oxygen administered for a short period. 8. You will be asked to provide medical information, including allergies, prior to the procedure.  We must know immediately if you are taking blood  Thinners (like Coumadin) or if you are allergic to IV iodine contrast (dye).  Possible side-effects: All are usually temporary  Bleeding from needle site  Light headedness  Numbness and tingling  Decreased blood pressure  Weakness in arms/legs  Pressure sensation in back/neck  Pain at injection site (several days)  Possible complications: All are extremely rare  Infection  Nerve injury  Spinal headache (a headache wore with upright position)  Call if you experience:  Fever/chills associated with headache or increased back/neck pain  Headache worsened by an upright position  New onset weakness or numbness of an extremity below the injection site  Hives or difficulty breathing (go to the emergency room)  Inflammation or drainage at the injection site(s)  Severe back/neck pain greater than usual  New symptoms which are concerning to you  Please note:  Although the local anesthetic injected can often make your back or neck feel good for several hours after the injection the pain will likely return.  It takes 3-5 days for steroids to work on the nerve root. You may not notice any pain relief for at least one week.  If effective, we will often do a series of 3 injections spaced 3-6 weeks apart to maximally decrease your pain.    If you have any questions, please call [phone] [facility]

## 2015-07-25 ENCOUNTER — Telehealth: Payer: Self-pay | Admitting: *Deleted

## 2015-07-25 ENCOUNTER — Other Ambulatory Visit: Payer: Self-pay | Admitting: Pain Medicine

## 2015-07-25 NOTE — Telephone Encounter (Signed)
No answer and no way to leave voicemail. Attempted twice.

## 2015-08-01 ENCOUNTER — Encounter: Payer: Self-pay | Admitting: Pain Medicine

## 2015-08-01 ENCOUNTER — Ambulatory Visit: Attending: Pain Medicine | Admitting: Pain Medicine

## 2015-08-01 VITALS — BP 152/90 | HR 90 | Temp 98.7°F | Resp 14 | Ht 63.0 in

## 2015-08-01 DIAGNOSIS — M5126 Other intervertebral disc displacement, lumbar region: Secondary | ICD-10-CM | POA: Diagnosis not present

## 2015-08-01 DIAGNOSIS — M5137 Other intervertebral disc degeneration, lumbosacral region: Secondary | ICD-10-CM

## 2015-08-01 DIAGNOSIS — M5136 Other intervertebral disc degeneration, lumbar region: Secondary | ICD-10-CM | POA: Insufficient documentation

## 2015-08-01 DIAGNOSIS — Z9889 Other specified postprocedural states: Secondary | ICD-10-CM | POA: Diagnosis not present

## 2015-08-01 DIAGNOSIS — M4806 Spinal stenosis, lumbar region: Secondary | ICD-10-CM | POA: Insufficient documentation

## 2015-08-01 DIAGNOSIS — M79604 Pain in right leg: Secondary | ICD-10-CM | POA: Diagnosis present

## 2015-08-01 DIAGNOSIS — F3132 Bipolar disorder, current episode depressed, moderate: Secondary | ICD-10-CM

## 2015-08-01 DIAGNOSIS — M961 Postlaminectomy syndrome, not elsewhere classified: Secondary | ICD-10-CM

## 2015-08-01 DIAGNOSIS — M797 Fibromyalgia: Secondary | ICD-10-CM

## 2015-08-01 DIAGNOSIS — M545 Low back pain: Secondary | ICD-10-CM | POA: Diagnosis present

## 2015-08-01 DIAGNOSIS — M48062 Spinal stenosis, lumbar region with neurogenic claudication: Secondary | ICD-10-CM

## 2015-08-01 DIAGNOSIS — M5481 Occipital neuralgia: Secondary | ICD-10-CM

## 2015-08-01 DIAGNOSIS — M5416 Radiculopathy, lumbar region: Secondary | ICD-10-CM

## 2015-08-01 DIAGNOSIS — M79605 Pain in left leg: Secondary | ICD-10-CM | POA: Diagnosis present

## 2015-08-01 DIAGNOSIS — M47896 Other spondylosis, lumbar region: Secondary | ICD-10-CM | POA: Insufficient documentation

## 2015-08-01 DIAGNOSIS — M47816 Spondylosis without myelopathy or radiculopathy, lumbar region: Secondary | ICD-10-CM

## 2015-08-01 DIAGNOSIS — M5030 Other cervical disc degeneration, unspecified cervical region: Secondary | ICD-10-CM

## 2015-08-01 MED ORDER — HYDROCODONE-ACETAMINOPHEN 10-325 MG PO TABS: ORAL_TABLET | ORAL | Status: DC

## 2015-08-01 MED ORDER — PREGABALIN 50 MG PO CAPS: ORAL_CAPSULE | ORAL | Status: DC

## 2015-08-01 NOTE — Progress Notes (Signed)
Safety precautions to be maintained throughout the outpatient stay will include: orient to surroundings, keep bed in low position, maintain call bell within reach at all times, provide assistance with transfer out of bed and ambulation.  

## 2015-08-01 NOTE — Patient Instructions (Addendum)
PLAN   Continue present medication Lyrica and hydrocodone  Lumbosacral selective nerve root block to be performed at time of return appointment  F/U PCP Dr.Olmedo  for evaliation of  BP and general medical  condition  F/U surgical evaluation. Ask Caryl Pina date of your neurosurgical evaluation of lower back and lower extremity pain  F/U neurological evaluation. May consider pending follow-up evaluations  May consider radiofrequency rhizolysis or intraspinal procedures pending response to present treatment and F/U evaluation   Patient to call [facility] should patient have concerns prior to scheduled return appointment. Selective Nerve Root Block Patient Information  Description: Specific nerve roots exit the spinal canal and these nerves can be compressed and inflamed by a bulging disc and bone spurs.  By injecting steroids on the nerve root, we can potentially decrease the inflammation surrounding these nerves, which often leads to decreased pain.  Also, by injecting local anesthesia on the nerve root, this can provide [place] helpful information to give to your referring doctor if it decreases your pain.  Selective nerve root blocks can be done along the spine from the neck to the low back depending on the location of your pain.   After numbing the skin with local anesthesia, a small needle is passed to the nerve root and the position of the needle is verified using x-ray pictures.  After the needle is in correct position, we then deposit the medication.  You may experience a pressure sensation while this is being done.  The entire block usually lasts less than 15 minutes.  Conditions that may be treated with selective nerve root blocks:  Low back and leg pain  Spinal stenosis  Diagnostic block prior to potential surgery  Neck and arm pain  Post laminectomy syndrome  Preparation for the injection:  1. Do not eat any solid food or dairy products within 6 hours of your  appointment. 2. You may drink clear liquids up to 2 hours before an appointment.  Clear liquids include water, black coffee, juice or soda.  No milk or cream please. 3. You may take your regular medications, including pain medications, with a sip of water before your appointment.  Diabetics should hold regular insulin (if taken separately) and take 1/2 normal NPH dose the morning of the procedure.  Carry some sugar containing items with you to your appointment. 4. A driver must accompany you and be prepared to drive you home after your procedure. 5. Bring all your current medications with you. 6. An IV may be inserted and sedation may be given at the discretion of the physician. 7. A blood pressure cuff, EKG, and other monitors will often be applied during the procedure.  Some patients may need to have extra oxygen administered for a short period. 8. You will be asked to provide medical information, including allergies, prior to the procedure.  We must know immediately if you are taking blood  Thinners (like Coumadin) or if you are allergic to IV iodine contrast (dye).  Possible side-effects: All are usually temporary  Bleeding from needle site  Light headedness  Numbness and tingling  Decreased blood pressure  Weakness in arms/legs  Pressure sensation in back/neck  Pain at injection site (several days)  Possible complications: All are extremely rare  Infection  Nerve injury  Spinal headache (a headache wore with upright position)  Call if you experience:  Fever/chills associated with headache or increased back/neck pain  Headache worsened by an upright position  New onset weakness or  numbness of an extremity below the injection site  Hives or difficulty breathing (go to the emergency room)  Inflammation or drainage at the injection site(s)  Severe back/neck pain greater than usual  New symptoms which are concerning to you  Please note:  Although the local  anesthetic injected can often make your back or neck feel good for several hours after the injection the pain will likely return.  It takes 3-5 days for steroids to work on the nerve root. You may not notice any pain relief for at least one week.  If effective, we will often do a series of 3 injections spaced 3-6 weeks apart to maximally decrease your pain.    If you have any questions, please call [phone] [facility] Clinic[facility] R[facility]KS AND COMPLICATIONS  What are the risk, side effects and possible complications? Generally speaking, most procedures are safe.  However, with any procedure there are risks, side effects, and the possibility of complications.  The risks and complications are dependent upon the sites that are lesioned, or the type of nerve block to be performed.  The closer the procedure is to the spine, the more serious the risks are.  Great care is taken when placing the radio frequency needles, block needles or lesioning probes, but sometimes complications can occur. 1. Infection: Any time there is an injection through the skin, there is a risk of infection.  This is why sterile conditions are used for these blocks.  There are four possible types of infection. 1. Localized skin infection. 2. Central Nervous System Infection-This can be in the form of Meningitis, which can be deadly. 3. Epidural Infections-This can be in the form of an epidural abscess, which can cause pressure inside of the spine, causing compression of the spinal cord with subsequent paralysis. This would require an emergency surgery to decompress, and there are no guarantees that the patient would recover from the paralysis. 4. Discitis-This is an infection of the intervertebral discs.  It occurs in about 1% of discography procedures.  It is difficult to treat and it may lead to surgery.        2. Pain: the needles have to go through skin and soft tissues, will cause soreness.        3. Damage to internal structures:  The nerves to be lesioned may be near blood vessels or    other nerves which can be potentially damaged.       4. Bleeding: Bleeding is more common if the patient is taking blood thinners such as  aspirin, Coumadin, Ticiid, Plavix, etc., or if he/she have some genetic predisposition  such as hemophilia. Bleeding into the spinal canal can cause compression of the spinal  cord with subsequent paralysis.  This would require an emergency surgery to  decompress and there are no guarantees that the patient would recover from the  paralysis.       5. Pneumothorax:  Puncturing of a lung is a possibility, every time a needle is introduced in  the area of the chest or upper back.  Pneumothorax refers to free air around the  collapsed lung(s), inside of the thoracic cavity (chest cavity).  Another two possible  complications related to a similar event would include: Hemothorax and Chylothorax.   These are variations of the Pneumothorax, where instead of air around the collapsed  lung(s), you may have blood or chyle, respectively.       6. Spinal headaches: They may occur with any  procedures in the area of the spine.       7. Persistent CSF (Cerebro-Spinal Fluid) leakage: This is a rare problem, but may occur  with prolonged intrathecal or epidural catheters either due to the formation of a fistulous  track or a dural tear.       8. Nerve damage: By working so close to the spinal cord, there is always a possibility of  nerve damage, which could be as serious as a permanent spinal cord injury with  paralysis.       9. Death:  Although rare, severe deadly allergic reactions known as "Anaphylactic  reaction" can occur to any of the medications used.      10. Worsening of the symptoms:  We can always make thing worse.  What are the chances of something like this happening? Chances of any of this occuring are extremely low.  By statistics, you have more of a chance of getting killed in a  motor vehicle accident: while driving to the hospital than any of the above occurring .  Nevertheless, you should be aware that they are possibilities.  In general, it is similar to taking a shower.  Everybody knows that you can slip, hit your head and get killed.  Does that mean that you should not shower again?  Nevertheless always keep in mind that statistics do not mean anything if you happen to be on the wrong side of them.  Even if a procedure has a 1 (one) in a 1,000,000 (million) chance of going wrong, it you happen to be that one..Also, keep in mind that by statistics, you have more of a chance of having something go wrong when taking medications.  Who should not have this procedure? If you are on a blood thinning medication (e.g. Coumadin, Plavix, see list of "Blood Thinners"), or if you have an active infection going on, you should not have the procedure.  If you are taking any blood thinners, please inform your physician.  How should I prepare for this procedure?  Do not eat or drink anything at least six hours prior to the procedure.  Bring a driver with you .  It cannot be a taxi.  Come accompanied by an adult that can drive you back, and that is strong enough to help you if your legs get weak or numb from the local anesthetic.  Take all of your medicines the morning of the procedure with just enough water to swallow them.  If you have diabetes, make sure that you are scheduled to have your procedure done first thing in the morning, whenever possible.  If you have diabetes, take only half of your insulin dose and notify our nurse that you have done so as soon as you arrive at the clinic.  If you are diabetic, but only take blood sugar pills (oral hypoglycemic), then do not take them on the morning of your procedure.  You may take them after you have had the procedure.  Do not take aspirin or any aspirin-containing medications, at least eleven (11) days prior to the procedure.  They  may prolong bleeding.  Wear loose fitting clothing that may be easy to take off and that you would not mind if it got stained with Betadine or blood.  Do not wear any jewelry or perfume  Remove any nail coloring.  It will interfere with some of our monitoring equipment.  NOTE: Remember that this is not meant to be interpreted as a complete  list of all possible complications.  Unforeseen problems may occur.  BLOOD THINNERS The following drugs contain aspirin or other products, which can cause increased bleeding during surgery and should not be taken for 2 weeks prior to and 1 week after surgery.  If you should need take something for relief of minor pain, you may take acetaminophen which is found in Tylenol,m Datril, Anacin-3 and Panadol. It is not blood thinner. The products listed below are.  Do not take any of the products listed below in addition to any listed on your instruction sheet.  A.P.C or A.P.C with Codeine Codeine Phosphate Capsules #3 Ibuprofen Ridaura  ABC compound Congesprin Imuran rimadil  Advil Cope Indocin Robaxisal  Alka-Seltzer Effervescent Pain Reliever and Antacid Coricidin or Coricidin-D  Indomethacin Rufen  Alka-Seltzer plus Cold Medicine Cosprin Ketoprofen S-A-C Tablets  Anacin Analgesic Tablets or Capsules Coumadin Korlgesic Salflex  Anacin Extra Strength Analgesic tablets or capsules CP-2 Tablets Lanoril Salicylate  Anaprox Cuprimine Capsules Levenox Salocol  Anexsia-D Dalteparin Magan Salsalate  Anodynos Darvon compound Magnesium Salicylate Sine-off  Ansaid Dasin Capsules Magsal Sodium Salicylate  Anturane Depen Capsules Marnal Soma  APF Arthritis pain formula Dewitt's Pills Measurin Stanback  Argesic Dia-Gesic Meclofenamic Sulfinpyrazone  Arthritis Bayer Timed Release Aspirin Diclofenac Meclomen Sulindac  Arthritis pain formula Anacin Dicumarol Medipren Supac  Analgesic (Safety coated) Arthralgen Diffunasal Mefanamic Suprofen  Arthritis Strength Bufferin  Dihydrocodeine Mepro Compound Suprol  Arthropan liquid Dopirydamole Methcarbomol with Aspirin Synalgos  ASA tablets/Enseals Disalcid Micrainin Tagament  Ascriptin Doan's Midol Talwin  Ascriptin A/D Dolene Mobidin Tanderil  Ascriptin Extra Strength Dolobid Moblgesic Ticlid  Ascriptin with Codeine Doloprin or Doloprin with Codeine Momentum Tolectin  Asperbuf Duoprin Mono-gesic Trendar  Aspergum Duradyne Motrin or Motrin IB Triminicin  Aspirin plain, buffered or enteric coated Durasal Myochrisine Trigesic  Aspirin Suppositories Easprin Nalfon Trillsate  Aspirin with Codeine Ecotrin Regular or Extra Strength Naprosyn Uracel  Atromid-S Efficin Naproxen Ursinus  Auranofin Capsules Elmiron Neocylate Vanquish  Axotal Emagrin Norgesic Verin  Azathioprine Empirin or Empirin with Codeine Normiflo Vitamin E  Azolid Emprazil Nuprin Voltaren  Bayer Aspirin plain, buffered or children's or timed BC Tablets or powders Encaprin Orgaran Warfarin Sodium  Buff-a-Comp Enoxaparin Orudis Zorpin  Buff-a-Comp with Codeine Equegesic Os-Cal-Gesic   Buffaprin Excedrin plain, buffered or Extra Strength Oxalid   Bufferin Arthritis Strength Feldene Oxphenbutazone   Bufferin plain or Extra Strength Feldene Capsules Oxycodone with Aspirin   Bufferin with Codeine Fenoprofen Fenoprofen Pabalate or Pabalate-SF   Buffets II Flogesic Panagesic   Buffinol plain or Extra Strength Florinal or Florinal with Codeine Panwarfarin   Buf-Tabs Flurbiprofen Penicillamine   Butalbital Compound Four-way cold tablets Penicillin   Butazolidin Fragmin Pepto-Bismol   Carbenicillin Geminisyn Percodan   Carna Arthritis Reliever Geopen Persantine   Carprofen Gold's salt Persistin   Chloramphenicol Goody's Phenylbutazone   Chloromycetin Haltrain Piroxlcam   Clmetidine heparin Plaquenil   Cllnoril Hyco-pap Ponstel   Clofibrate Hydroxy chloroquine Propoxyphen         Before stopping any of these medications, be sure to consult the  physician who ordered them.  Some, such as Coumadin (Warfarin) are ordered to prevent or treat serious conditions such as "deep thrombosis", "pumonary embolisms", and other heart problems.  The amount of time that you may need off of the medication may also vary with the medication and the reason for which you were taking it.  If you are taking any of these medications, please make sure you notify your pain physician before you undergo  any procedures.

## 2015-08-01 NOTE — Progress Notes (Signed)
   Subjective:    Patient ID: Jennifer [name], female    DOB: 17-May-1956, 59 y.o.   MRN: [id number]  HPI patient is 59 year old female returns to [facility] for further evaluation and treatment of pain involving the lumbar lower extremity region. Patient is significant pain involving the lower extremity region on the right which is aggravated by standing walking and becomes more intense as the day progresses. Patient states the pain is associated with numbness which becomes more intense as the day progresses. Patient denies trauma change in events of daily living the call significant change in symptomatology. We reviewed MRI findings on today's visit and we'll schedule patient for neurosurgical reevaluation to discuss patient's lumbar findings as we explained to patient on today's visit and we will proceed with interventional treatment consisting of lumbosacral selective nerve root block in attempt to decrease severity of symptoms, minimize aggression of symptoms, and avoid the need for more involved treatment. The patient was understanding and in agreement with suggested treatment plan. Continue Lyrica and hydrocodone acetaminophen as discussed. The patient was understanding and in agreement status treatment plan     Review of Systems     Objective:   Physical Exam  There was mild tenderness of the splenius capitis and occipitalis musculature region There was unremarkable Spurling's maneuver and there was minimal tenderness to palpation of the acromioclavicular and glenohumeral joint regions. Patient appeared to be with slightly decreased grip strength and Tinel and Phalen's maneuver were without increased pain of significant degree. Palpation of the thoracic facet thoracic paraspinal muscles region was with moderate tends to palpation in the lower thoracic paraspinal musculature region right greater than left with moderate muscle spasms greater on the right greater than the left. There  was mild to moderate tends of the PSIS and PII S regions. There was mild tenderness of the greater trochanteric region and iliotibial band region. There was tenderness to palpation over the lumbar facet lumbar paraspinal muscles region a moderate to moderately severe degree. Straight leg raising tolerates approximately 20 on the right with somewhat increased pain with dorsiflexion noted. There was negative clonus negative Homans. DTRs appear to be trace at the knees. There is question decreased sensation along the L5 dermatomal distribution. Abdomen nontender with no costovertebral tenderness noted.      Assessment & Plan:  Degenerative disc disease lumbar spine  MRI revealed degenerative changes with prior surgical intervention lumbar region, L5-S1 posterior lumbar interbody fusion, 10 x 12 mm right facet intraspinal synovial cyst, mass effect upon the right lateral thecal sac and mild medial displacement of the S1 nerve root as it abutting the foraminal portion of the L5 nerve root. L4-L5 broad-based disc bulge with moderate bilateral facet arthropathy and mild right foraminal stenosis   Lumbar stenosis with neurogenic claudication  Lumbar radiculopathy  Lumbar facet syndrome   Lumbar stenosis with neurogenic claudication     PLAN   Continue present medication Lyrica and hydrocodone acetaminophen  Lumbosacral selective nerve root block to be performed at time return appointment  F/U PCP for evaliation of  BP and general medical  condition  F/U surgical evaluation as scheduled  F/U neurological evaluation. May consider pending follow-up evaluations  May consider radiofrequency rhizolysis or intraspinal procedures pending response to present treatment and F/U evaluation   Patient to call [facility] should patient have concerns prior to scheduled return appointment.

## 2015-08-08 ENCOUNTER — Ambulatory Visit (INDEPENDENT_AMBULATORY_CARE_PROVIDER_SITE_OTHER): Admitting: Psychiatry

## 2015-08-08 ENCOUNTER — Encounter: Payer: Self-pay | Admitting: Psychiatry

## 2015-08-08 VITALS — BP 124/84 | HR 88 | Temp 98.3°F | Ht 63.0 in | Wt 171.4 lb

## 2015-08-08 DIAGNOSIS — F3130 Bipolar disorder, current episode depressed, mild or moderate severity, unspecified: Secondary | ICD-10-CM | POA: Diagnosis not present

## 2015-08-08 MED ORDER — ALPRAZOLAM 0.5 MG PO TABS: 0.5000 mg | ORAL_TABLET | Freq: Every evening | ORAL | Status: DC | PRN

## 2015-08-08 MED ORDER — ZOLPIDEM TARTRATE 10 MG PO TABS: 10.0000 mg | ORAL_TABLET | Freq: Every day | ORAL | Status: DC

## 2015-08-08 MED ORDER — VILAZODONE HCL 10 MG PO TABS: 10.0000 mg | ORAL_TABLET | Freq: Every day | ORAL | Status: DC

## 2015-08-08 MED ORDER — QUETIAPINE FUMARATE 300 MG PO TABS: 300.0000 mg | ORAL_TABLET | Freq: Every day | ORAL | Status: DC

## 2015-08-08 MED ORDER — LITHIUM CARBONATE 300 MG PO CAPS: 300.0000 mg | ORAL_CAPSULE | Freq: Every day | ORAL | Status: DC

## 2015-08-08 NOTE — Progress Notes (Signed)
BH MD/PA/NP OP Progress Note  08/08/2015 1:39 PM Jennifer [name]  MRN:  [id number]  Subjective:    Patient is a 59 year old married female who presented for the follow-up appointment. She reported that she is currently having severe pain in her back which is affecting her inability to walk. She has been diagnosed with a cyst in her back and she is trying to figure out if she can have it operated. Patient stated that her son has a similar symptoms and he is going to back pain as well. Patient appeared depressed and anxious due to the pain in her back. She stated that she has increased the dose of Seroquel to 1-1/2 pill at night. She also takes Ambien at bedtime. She currently denied having any suicidal ideations or plans. She reported that she takes Xanax on a when necessary basis to help with anxiety symptoms. She is compliant with her medication and the lithium is helping with her mood symptoms. She denied having any mood swings anger anxiety at this time. [facility]   Chief Complaint:  Chief Complaint    Follow-up; Medication Refill     Visit Diagnosis:     ICD-9-CM ICD-10-CM   1. Bipolar I disorder, most recent episode depressed ([place]) 296.50 F31.30     Past Medical History:  Past Medical History  Diagnosis Date  . GERD (gastroesophageal reflux disease)   . Hyperlipidemia   . Hypertension   . Enlarged liver   . Diabetes mellitus   . Low back pain   . Depression with anxiety   . CAD (coronary artery disease)   . Von Willebrand disease ([place])   . Migraines   . Depression   . Persistent disorder of initiating or maintaining sleep   . Generalized anxiety disorder   . Bipolar depression ([place])   . Anxiety   . Neuropathy [facility])     Past Surgical History  Procedure Laterality Date  . Cardiac catheterization      CAD,PCI of LAD, Cypher stent 2.5-28mm  . Cardiac catheterization      PCI of mid-LAD in stent restenosis with a  drug-eluting stent  . Breast implant removal    . Coronary artery bypass graft   08-12-2011    CABG x 3  . Cardiac catheterization  06/03/13    [facility];MID LAD 100 % STENOSIS; PRIOR STENT; MID RCA 40 % STENOSIS  . Coronary stent placement    . Lumbar fusion      L1-S1  . Abdominal hysterectomy    . Back surgery     Family History:  Family History  Problem Relation Age of Onset  . Hypertension Other     family hx  . Hyperlipidemia Other     family hx  . Diabetes Other     family hx  . Aneurysm Father     abdominal  . Diabetes Father   . Alcohol abuse Father   . Hypertension Father   . Aneurysm Brother     abdominal  . Alcohol abuse Brother   . Hypertension Brother   . Diabetes Mother   . Alcohol abuse Mother    Social History:  Social History   Social History  . Marital Status: Married    Spouse Name: N/A  . Number of Children: N/A  . Years of Education: N/A   Social History Main Topics  . Smoking status: Former Smoker -- 16 years    Types: Cigarettes  Quit date: 06/20/2010  . Smokeless tobacco: Never Used  . Alcohol Use: No  . Drug Use: No  . Sexual Activity: Yes   Other Topics Concern  . None   Social History Narrative   Additional History:  Lives with her family members and has good relationship with them.  Assessment:   Musculoskeletal: Strength & Muscle Tone: within normal limits Gait & Station: normal Patient leans: N/A  Psychiatric Specialty Exam: HPI   Review of Systems  Constitutional: Positive for malaise/fatigue.  HENT: Negative.  Negative for nosebleeds and tinnitus.   Eyes: Negative.  Negative for photophobia.  Respiratory: Negative.   Cardiovascular: Negative.  Negative for palpitations.  Gastrointestinal: Negative for nausea and diarrhea.  Genitourinary: Negative for frequency.  Musculoskeletal: Positive for myalgias and back pain.  Skin: Negative.   Neurological: Negative for tremors.  Endo/Heme/Allergies: Negative for  environmental allergies.  Psychiatric/Behavioral: Positive for depression. Negative for suicidal ideas and substance abuse. The patient is nervous/anxious and has insomnia.   All other systems reviewed and are negative.   Blood pressure 124/84, pulse 88, temperature 98.3 F (36.8 C), temperature source Tympanic, height _0  (1.6 m), weight 171 lb 6.4 oz (77.747 kg), SpO2 95 %.Body mass index is 30.37 kg/(m^2).  General Appearance: Casual  Eye Contact:  Fair  Speech:  Slow  Volume:  Decreased  Mood:  Anxious and Depressed  Affect:  Congruent  Thought Process:  Logical  Orientation:  Full (Time, Place, and Person)  Thought Content:  WDL  Suicidal Thoughts:  No  Homicidal Thoughts:  No  Memory:  Immediate;   Fair  Judgement:  Fair  Insight:  Fair  Psychomotor Activity:  Normal  Concentration:  Fair  Recall:  [employer] of Knowledge: Fair  Language: Fair  Akathisia:  No  Handed:  Right  AIMS (if indicated):    Assets:  Communication Skills Desire for Improvement Housing Social Support  ADL's:  Intact  Cognition: WNL  Sleep:  good   Is the patient at risk to self?  No. Has the patient been a risk to self in the past 6 months?  No. Has the patient been a risk to self within the distant past?  Yes.   Is the patient a risk to others?  No. Has the patient been a risk to others in the past 6 months?  No. Has the patient been a risk to others within the distant past?  No.  Current Medications: Current Outpatient Prescriptions  Medication Sig Dispense Refill  . albuterol-ipratropium (COMBIVENT) 18-103 MCG/ACT inhaler Inhale into the lungs as needed for wheezing or shortness of breath.    . ALPRAZolam (XANAX) 0.5 MG tablet Take 1 tablet (0.5 mg total) by mouth at bedtime as needed for anxiety. 30 tablet 2  . amLODipine (NORVASC) 5 MG tablet Take 1 tablet (5 mg total) by mouth daily. 90 tablet 3  . aspirin EC 81 MG tablet Take 1 tablet (81 mg total) by mouth daily. 90 tablet 3  .  atorvastatin (LIPITOR) 40 MG tablet Take 1 tablet (40 mg total) by mouth daily. 90 tablet 3  . Blood Glucose Monitoring Suppl (FREESTYLE FREEDOM LITE) W/DEVICE KIT Use 3 (three) times daily. 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 Dx Code: 250.00    . carvedilol (COREG) 12.5 MG tablet Take 1 tablet (12.5 mg total) by mouth 2 (two) times daily with a meal. 180 tablet 3  . cefUROXime (CEFTIN) 250 MG tablet Take 1 tablet (250  mg total) by mouth 2 (two) times daily with a meal. 14 tablet 0  . cefUROXime (CEFTIN) 250 MG tablet Take 1 tablet (250 mg total) by mouth 2 (two) times daily with a meal. 14 tablet 0  . Cholecalciferol (VITAMIN D) 2000 UNITS tablet Take 2,000 Units by mouth daily.    . fenofibrate micronized (LOFIBRA) 134 MG capsule Take 134 mg by mouth daily before breakfast.    . fluticasone (FLONASE) 50 MCG/ACT nasal spray Place into the nose.    Marland Kitchen HYDROcodone-acetaminophen (NORCO) 10-325 MG tablet Limit 3-6 tabs by mouth per day if tolerated   ( NO OXYCODONE ) 180 tablet 0  . Insulin Isophane & Regular (HUMULIN 70/30 PEN [place]) Inject 27 Units into the skin 2 (two) times daily.     . isosorbide mononitrate (IMDUR) 60 MG 24 hr tablet     . lisinopril (PRINIVIL,ZESTRIL) 20 MG tablet Take 1 tablet (20 mg total) by mouth daily. 90 tablet 3  . lithium carbonate 300 MG capsule Take 1 capsule by mouth daily.    . metFORMIN (GLUCOPHAGE) 500 MG tablet Take 1,000 mg by mouth 2 (two) times daily with a meal.    . nitroGLYCERIN (NITROSTAT) 0.4 MG SL tablet Place 1 tablet (0.4 mg total) under the tongue every 5 (five) minutes as needed for chest pain. 25 tablet 6  . omeprazole (PRILOSEC) 20 MG capsule Take 20 mg by mouth daily.     . pioglitazone (ACTOS) 30 MG tablet     . pregabalin (LYRICA) 50 MG capsule Limit  one tablet by mouth per day or twice per day if tolerated 60 capsule 0  . PROAIR HFA 108 (90 BASE) MCG/ACT inhaler     . QUEtiapine (SEROQUEL XR) 300 MG 24 hr tablet Take 300 mg by mouth at  bedtime. 1 tablets every hs    . rOPINIRole (REQUIP) 2 MG tablet Take 2 mg by mouth at bedtime as needed.     . Vilazodone HCl (VIIBRYD) 10 MG TABS Take 10 mg by mouth daily.    Marland Kitchen zolpidem (AMBIEN) 10 MG tablet Take 1 tablet (10 mg total) by mouth at bedtime. 30 tablet 2   No current facility-administered medications for this visit.    Medical Decision Making:  Review of Psycho-Social Stressors (1) and Review of Last Therapy Session (1)  Treatment Plan Summary:Medication management   Anxiety Patient will continue on Viibryd and Xanax as prescribed  Mood symptoms She will continue on Seroquel 300 mg 1-1/2 pill at bedtime. I will start her on the regular Seroquel as she has been taking 1-1/2 pill daily  Insomnia Patient takes Ambien at bedtime and discussed with her about the adverse effects of the medication in detail and she demonstrated understanding  Follow-up She will follow up in 3 months or earlier depending on her symptoms     More than 50% of the time spent in psychoeducation, counseling and coordination of care.  Time spent with the patient 25 minutes   This note was generated in part or whole with voice recognition software. Voice regonition is usually quite accurate but there are transcription errors that can and very often do occur. I apologize for any typographical errors that were not detected and corrected.    Rainey Pines 08/08/2015, 1:39 PM

## 2015-08-10 ENCOUNTER — Ambulatory Visit: Payer: Self-pay | Admitting: Psychiatry

## 2015-08-23 ENCOUNTER — Encounter: Payer: Self-pay | Admitting: Pain Medicine

## 2015-08-23 ENCOUNTER — Ambulatory Visit: Attending: Pain Medicine | Admitting: Pain Medicine

## 2015-08-23 VITALS — BP 155/83 | HR 81 | Temp 98.7°F | Resp 16 | Ht 63.0 in | Wt 166.0 lb

## 2015-08-23 DIAGNOSIS — M51379 Other intervertebral disc degeneration, lumbosacral region without mention of lumbar back pain or lower extremity pain: Secondary | ICD-10-CM

## 2015-08-23 DIAGNOSIS — M5030 Other cervical disc degeneration, unspecified cervical region: Secondary | ICD-10-CM

## 2015-08-23 DIAGNOSIS — M797 Fibromyalgia: Secondary | ICD-10-CM

## 2015-08-23 DIAGNOSIS — M961 Postlaminectomy syndrome, not elsewhere classified: Secondary | ICD-10-CM

## 2015-08-23 DIAGNOSIS — M5136 Other intervertebral disc degeneration, lumbar region: Secondary | ICD-10-CM | POA: Insufficient documentation

## 2015-08-23 DIAGNOSIS — M79606 Pain in leg, unspecified: Secondary | ICD-10-CM | POA: Insufficient documentation

## 2015-08-23 DIAGNOSIS — M47816 Spondylosis without myelopathy or radiculopathy, lumbar region: Secondary | ICD-10-CM

## 2015-08-23 DIAGNOSIS — F3132 Bipolar disorder, current episode depressed, moderate: Secondary | ICD-10-CM

## 2015-08-23 DIAGNOSIS — M5481 Occipital neuralgia: Secondary | ICD-10-CM

## 2015-08-23 DIAGNOSIS — M5416 Radiculopathy, lumbar region: Secondary | ICD-10-CM

## 2015-08-23 DIAGNOSIS — M5137 Other intervertebral disc degeneration, lumbosacral region: Secondary | ICD-10-CM

## 2015-08-23 DIAGNOSIS — M48062 Spinal stenosis, lumbar region with neurogenic claudication: Secondary | ICD-10-CM

## 2015-08-23 MED ORDER — BUPIVACAINE HCL (PF) 0.25 % IJ SOLN
INTRAMUSCULAR | Status: AC
  Filled 2015-08-23: qty 30

## 2015-08-23 MED ORDER — LIDOCAINE HCL (PF) 1 % IJ SOLN
10.0000 mL | Freq: Once | INTRAMUSCULAR | Status: AC
  Administered 2015-08-23: 10:00:00 via SUBCUTANEOUS

## 2015-08-23 MED ORDER — CEFAZOLIN SODIUM 1-5 GM-% IV SOLN: 1.0000 g | Freq: Once | INTRAVENOUS | Status: DC

## 2015-08-23 MED ORDER — LACTATED RINGERS IV SOLN: 1000.0000 mL | INTRAVENOUS | Status: DC

## 2015-08-23 MED ORDER — CEFUROXIME AXETIL 250 MG PO TABS: 250.0000 mg | ORAL_TABLET | Freq: Two times a day (BID) | ORAL | Status: DC

## 2015-08-23 MED ORDER — ORPHENADRINE CITRATE 30 MG/ML IJ SOLN
60.0000 mg | Freq: Once | INTRAMUSCULAR | Status: AC
  Administered 2015-08-23: 60 mg via INTRAMUSCULAR

## 2015-08-23 MED ORDER — SODIUM CHLORIDE 0.9 % IJ SOLN
INTRAMUSCULAR | Status: AC
  Filled 2015-08-23: qty 10

## 2015-08-23 MED ORDER — FENTANYL CITRATE (PF) 100 MCG/2ML IJ SOLN
INTRAMUSCULAR | Status: AC
  Administered 2015-08-23: 100 ug via INTRAVENOUS
  Filled 2015-08-23: qty 2

## 2015-08-23 MED ORDER — MIDAZOLAM HCL 5 MG/5ML IJ SOLN
5.0000 mg | Freq: Once | INTRAMUSCULAR | Status: AC
  Administered 2015-08-23: 4 mg via INTRAVENOUS

## 2015-08-23 MED ORDER — TRIAMCINOLONE ACETONIDE 40 MG/ML IJ SUSP
40.0000 mg | Freq: Once | INTRAMUSCULAR | Status: AC
  Administered 2015-08-23: 40 mg

## 2015-08-23 MED ORDER — LIDOCAINE HCL (PF) 1 % IJ SOLN
INTRAMUSCULAR | Status: AC
  Filled 2015-08-23: qty 5

## 2015-08-23 MED ORDER — CEFAZOLIN SODIUM 1 G IJ SOLR
INTRAMUSCULAR | Status: AC
  Administered 2015-08-23: 1 g via INTRAVENOUS
  Filled 2015-08-23: qty 10

## 2015-08-23 MED ORDER — BUPIVACAINE HCL (PF) 0.25 % IJ SOLN
30.0000 mL | Freq: Once | INTRAMUSCULAR | Status: AC
  Administered 2015-08-23: 10:00:00

## 2015-08-23 MED ORDER — ORPHENADRINE CITRATE 30 MG/ML IJ SOLN
INTRAMUSCULAR | Status: AC
  Administered 2015-08-23: 60 mg via INTRAMUSCULAR
  Filled 2015-08-23: qty 2

## 2015-08-23 MED ORDER — MIDAZOLAM HCL 5 MG/5ML IJ SOLN
INTRAMUSCULAR | Status: AC
  Administered 2015-08-23: 4 mg via INTRAVENOUS
  Filled 2015-08-23: qty 5

## 2015-08-23 MED ORDER — SODIUM CHLORIDE 0.9 % IJ SOLN
20.0000 mL | Freq: Once | INTRAMUSCULAR | Status: AC
  Administered 2015-08-23: 10:00:00

## 2015-08-23 MED ORDER — FENTANYL CITRATE (PF) 100 MCG/2ML IJ SOLN
100.0000 ug | Freq: Once | INTRAMUSCULAR | Status: AC
  Administered 2015-08-23: 100 ug via INTRAVENOUS

## 2015-08-23 MED ORDER — TRIAMCINOLONE ACETONIDE 40 MG/ML IJ SUSP
INTRAMUSCULAR | Status: AC
  Administered 2015-08-23: 40 mg
  Filled 2015-08-23: qty 1

## 2015-08-23 NOTE — Progress Notes (Signed)
Safety precautions to be maintained throughout the outpatient stay will include: orient to surroundings, keep bed in low position, maintain call bell within reach at all times, provide assistance with transfer out of bed and ambulation.  

## 2015-08-23 NOTE — Progress Notes (Signed)
Subjective:    Patient ID: Jennifer [name], female    DOB: 1956-01-11, 59 y.o.   MRN: [id number]  HPI  PROCEDURE PERFORMED: Lumbosacral selective nerve root block   NOTE: The patient is a 59 y.o. female who returns to [place] for further evaluation and treatment of pain involving the lumbar and lower extremity region. 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 L4-L5 broad-based disc bulge with moderate bilateral facet arthropathy and mild right foraminal stenos has revealed the patient to be with evidence of MRI There is concern regarding intraspinal abnormalities contributing to the patient's symptomatology. The risks, benefits, and expectations of the procedure have been explained to the patient who was understanding and in agreement with suggested treatment plan. We will proceed with interventional treatment as discussed and as explained to the patient. The patient is understanding and in agreement with suggested treatment plan.   DESCRIPTION OF PROCEDURE: Lumbosacral selective nerve root block with IV Versed, IV fentanyl conscious sedation, EKG, blood pressure, pulse, and pulse oximetry monitoring. The procedure was performed with the patient in the prone position under fluoroscopic guidance. With the patient in the prone position, Betadine prep of proposed entry site was performed. Local anesthetic skin wheal of proposed needle entry site was prepared with 1.5% plain lidocaine with AP view of the lumbosacral spine.   PROCEDURE #1: Needle placement at the right L 2 vertebral body: A 22 -gauge needle was inserted at the inferior border of the  transverse process of the vertebral body with needle placed medial to the midline of the transverse process on AP view of the lumbosacral spine.   NEEDLE PLACEMENT AT  L 3, L4, and L5 VERTEBRAL BODY LEVELS  Needle  placement was accomplished at L3, L4, and L5 vertebral body levels on the right side exactly as was accomplished at the L2 vertebral body level  and utilizing the same technique and under fluoroscopic guidance.    Needle placement was then verified on lateral view at all levels with needle tip documented to be in the posterior superior quadrant of the intervertebral foramen of  L 2, L3, L4, and L5 Following negative aspiration for heme and CSF at each level, each level was injected with 3 mL of 0.25% bupivacaine with Kenalog.  Myoneural block injections of the cervical region Following Betadine prep of proposed entry site a 20-gauge needle was inserted in the cervical paraspinal musculature region and following negative aspiration 2 cc of 0.25% bupivacaine with Norflex was injected for myoneural block injection of the cervical region 4    The patient tolerated the procedure well. A total of 10 mg of Kenalog was utilized for the procedure.   PLAN:  1. Medications: Will continue presently prescribed medications. 2. The patient is to undergo follow-up evaluation with PCP for evaluation of blood pressure and general medical condition status post procedure performed on today's visit. 3. Surgical follow-up evaluation. 4. Neurological evaluation. 5. May consider radiofrequency procedures, implantation type procedures and other treatment pending response to treatment and follow-up evaluation. 6. The patient has been advise do adhere to proper body mechanics and avoid activities which may aggravate condition. The patient has been advised to  call the [facility] prior to scheduled return appointment should there be significant change in the patient's condition or should the  patient have other concerns regarding condition prior to scheduled return appointment.   Review of Systems     Objective:   Physical Exam        Assessment & Plan:

## 2015-08-23 NOTE — Patient Instructions (Addendum)
PLAN   Continue present medication Lyrica and hydrocodone and begin taking antibiotics Ceftin today as prescrib  F/U PCP Dr.Olmedo  for evaliation of  BP and general medical  condition  F/U surgical evaluation. Neurosurgical evaluation as planned  F/U neurological evaluation. May consider pending follow-up evaluations  May consider radiofrequency rhizolysis or intraspinal procedures pending response to present treatment and F/U evaluation   Patient to call Pain Management Center should patient have concerns prior to scheduled return appointment.Pain Management Discharge Instructions  General Discharge Instructions :  If you need to reach your doctor call: Monday-Friday 8:00 am - 4:00 pm at [phone] or toll free [phone].  After clinic hours [phone] to have operator reach doctor.  Bring all of your medication bottles to all your appointments in the pain clinic.  To cancel or reschedule your appointment with Pain Management please remember to call 24 hours in advance to avoid a fee.  Refer to the educational materials which you have been given on: General Risks, I had my Procedure. Discharge Instructions, Post Sedation.  Post Procedure Instructions:  The drugs you were given will stay in your system until tomorrow, so for the next 24 hours you should not drive, make any legal decisions or drink any alcoholic beverages.  You may eat anything you prefer, but it is better to start with liquids then soups and crackers, and gradually work up to solid foods.  Please notify your doctor immediately if you have any unusual bleeding, trouble breathing or pain that is not related to your normal pain.  Depending on the type of procedure that was done, some parts of your body may feel week and/or numb.  This usually clears up by tonight or the next day.  Walk with the use of an assistive device or accompanied by an adult for the 24 hours.  You may use ice on the affected area for  the first 24 hours.  Put ice in a Ziploc bag and cover with a towel and place against area 15 minutes on 15 minutes off.  You may switch to heat after 24 hours.GENERAL RISKS AND COMPLICATIONS  What are the risk, side effects and possible complications? Generally speaking, most procedures are safe.  However, with any procedure there are risks, side effects, and the possibility of complications.  The risks and complications are dependent upon the sites that are lesioned, or the type of nerve block to be performed.  The closer the procedure is to the spine, the more serious the risks are.  Great care is taken when placing the radio frequency needles, block needles or lesioning probes, but sometimes complications can occur. 1. Infection: Any time there is an injection through the skin, there is a risk of infection.  This is why sterile conditions are used for these blocks.  There are four possible types of infection. 1. Localized skin infection. 2. Central Nervous System Infection-This can be in the form of Meningitis, which can be deadly. 3. Epidural Infections-This can be in the form of an epidural abscess, which can cause pressure inside of the spine, causing compression of the spinal cord with subsequent paralysis. This would require an emergency surgery to decompress, and there are no guarantees that the patient would recover from the paralysis. 4. Discitis-This is an infection of the intervertebral discs.  It occurs in about 1% of discography procedures.  It is difficult to treat and it may lead to surgery.        2. Pain: the needles  have to go through skin and soft tissues, will cause soreness.       3. Damage to internal structures:  The nerves to be lesioned may be near blood vessels or    other nerves which can be potentially damaged.       4. Bleeding: Bleeding is more common if the patient is taking blood thinners such as  aspirin, Coumadin, Ticiid, Plavix, etc., or if he/she have some genetic  predisposition  such as hemophilia. Bleeding into the spinal canal can cause compression of the spinal  cord with subsequent paralysis.  This would require an emergency surgery to  decompress and there are no guarantees that the patient would recover from the  paralysis.       5. Pneumothorax:  Puncturing of a lung is a possibility, every time a needle is introduced in  the area of the chest or upper back.  Pneumothorax refers to free air around the  collapsed lung(s), inside of the thoracic cavity (chest cavity).  Another two possible  complications related to a similar event would include: Hemothorax and Chylothorax.   These are variations of the Pneumothorax, where instead of air around the collapsed  lung(s), you may have blood or chyle, respectively.       6. Spinal headaches: They may occur with any procedures in the area of the spine.       7. Persistent CSF (Cerebro-Spinal Fluid) leakage: This is a rare problem, but may occur  with prolonged intrathecal or epidural catheters either due to the formation of a fistulous  track or a dural tear.       8. Nerve damage: By working so close to the spinal cord, there is always a possibility of  nerve damage, which could be as serious as a permanent spinal cord injury with  paralysis.       9. Death:  Although rare, severe deadly allergic reactions known as "Anaphylactic  reaction" can occur to any of the medications used.      10. Worsening of the symptoms:  We can always make thing worse.  What are the chances of something like this happening? Chances of any of this occuring are extremely low.  By statistics, you have more of a chance of getting killed in a motor vehicle accident: while driving to the hospital than any of the above occurring .  Nevertheless, you should be aware that they are possibilities.  In general, it is similar to taking a shower.  Everybody knows that you can slip, hit your head and get killed.  Does that mean that you should not  shower again?  Nevertheless always keep in mind that statistics do not mean anything if you happen to be on the wrong side of them.  Even if a procedure has a 1 (one) in a 1,000,000 (million) chance of going wrong, it you happen to be that one..Also, keep in mind that by statistics, you have more of a chance of having something go wrong when taking medications.  Who should not have this procedure? If you are on a blood thinning medication (e.g. Coumadin, Plavix, see list of "Blood Thinners"), or if you have an active infection going on, you should not have the procedure.  If you are taking any blood thinners, please inform your physician.  How should I prepare for this procedure?  Do not eat or drink anything at least six hours prior to the procedure.  Bring a driver with you .  It cannot be a taxi.  Come accompanied by an adult that can drive you back, and that is strong enough to help you if your legs get weak or numb from the local anesthetic.  Take all of your medicines the morning of the procedure with just enough water to swallow them.  If you have diabetes, make sure that you are scheduled to have your procedure done first thing in the morning, whenever possible.  If you have diabetes, take only half of your insulin dose and notify our nurse that you have done so as soon as you arrive at the clinic.  If you are diabetic, but only take blood sugar pills (oral hypoglycemic), then do not take them on the morning of your procedure.  You may take them after you have had the procedure.  Do not take aspirin or any aspirin-containing medications, at least eleven (11) days prior to the procedure.  They may prolong bleeding.  Wear loose fitting clothing that may be easy to take off and that you would not mind if it got stained with Betadine or blood.  Do not wear any jewelry or perfume  Remove any nail coloring.  It will interfere with some of our monitoring equipment.  NOTE: Remember that  this is not meant to be interpreted as a complete list of all possible complications.  Unforeseen problems may occur.  BLOOD THINNERS The following drugs contain aspirin or other products, which can cause increased bleeding during surgery and should not be taken for 2 weeks prior to and 1 week after surgery.  If you should need take something for relief of minor pain, you may take acetaminophen which is found in Tylenol,m Datril, Anacin-3 and Panadol. It is not blood thinner. The products listed below are.  Do not take any of the products listed below in addition to any listed on your instruction sheet.  A.P.C or A.P.C with Codeine Codeine Phosphate Capsules #3 Ibuprofen Ridaura  ABC compound Congesprin Imuran rimadil  Advil Cope Indocin Robaxisal  Alka-Seltzer Effervescent Pain Reliever and Antacid Coricidin or Coricidin-D  Indomethacin Rufen  Alka-Seltzer plus Cold Medicine Cosprin Ketoprofen S-A-C Tablets  Anacin Analgesic Tablets or Capsules Coumadin Korlgesic Salflex  Anacin Extra Strength Analgesic tablets or capsules CP-2 Tablets Lanoril Salicylate  Anaprox Cuprimine Capsules Levenox Salocol  Anexsia-D Dalteparin Magan Salsalate  Anodynos Darvon compound Magnesium Salicylate Sine-off  Ansaid Dasin Capsules Magsal Sodium Salicylate  Anturane Depen Capsules Marnal Soma  APF Arthritis pain formula Dewitt's Pills Measurin Stanback  Argesic Dia-Gesic Meclofenamic Sulfinpyrazone  Arthritis Bayer Timed Release Aspirin Diclofenac Meclomen Sulindac  Arthritis pain formula Anacin Dicumarol Medipren Supac  Analgesic (Safety coated) Arthralgen Diffunasal Mefanamic Suprofen  Arthritis Strength Bufferin Dihydrocodeine Mepro Compound Suprol  Arthropan liquid Dopirydamole Methcarbomol with Aspirin Synalgos  ASA tablets/Enseals Disalcid Micrainin Tagament  Ascriptin Doan's Midol Talwin  Ascriptin A/D Dolene Mobidin Tanderil  Ascriptin Extra Strength Dolobid Moblgesic Ticlid  Ascriptin with Codeine  Doloprin or Doloprin with Codeine Momentum Tolectin  Asperbuf Duoprin Mono-gesic Trendar  Aspergum Duradyne Motrin or Motrin IB Triminicin  Aspirin plain, buffered or enteric coated Durasal Myochrisine Trigesic  Aspirin Suppositories Easprin Nalfon Trillsate  Aspirin with Codeine Ecotrin Regular or Extra Strength Naprosyn Uracel  Atromid-S Efficin Naproxen Ursinus  Auranofin Capsules Elmiron Neocylate Vanquish  Axotal Emagrin Norgesic Verin  Azathioprine Empirin or Empirin with Codeine Normiflo Vitamin E  Azolid Emprazil Nuprin Voltaren  Bayer Aspirin plain, buffered or children's or timed BC Tablets or powders Encaprin Orgaran Warfarin Sodium  Buff-a-Comp Enoxaparin Orudis Zorpin  Buff-a-Comp with Codeine Equegesic Os-Cal-Gesic   Buffaprin Excedrin plain, buffered or Extra Strength Oxalid   Bufferin Arthritis Strength Feldene Oxphenbutazone   Bufferin plain or Extra Strength Feldene Capsules Oxycodone with Aspirin   Bufferin with Codeine Fenoprofen Fenoprofen Pabalate or Pabalate-SF   Buffets II Flogesic Panagesic   Buffinol plain or Extra Strength Florinal or Florinal with Codeine Panwarfarin   Buf-Tabs Flurbiprofen Penicillamine   Butalbital Compound Four-way cold tablets Penicillin   Butazolidin Fragmin Pepto-Bismol   Carbenicillin Geminisyn Percodan   Carna Arthritis Reliever Geopen Persantine   Carprofen Gold's salt Persistin   Chloramphenicol Goody's Phenylbutazone   Chloromycetin Haltrain Piroxlcam   Clmetidine heparin Plaquenil   Cllnoril Hyco-pap Ponstel   Clofibrate Hydroxy chloroquine Propoxyphen         Before stopping any of these medications, be sure to consult the physician who ordered them.  Some, such as Coumadin (Warfarin) are ordered to prevent or treat serious conditions such as "deep thrombosis", "pumonary embolisms", and other heart problems.  The amount of time that you may need off of the medication may also vary with the medication and the reason for which  you were taking it.  If you are taking any of these medications, please make sure you notify your pain physician before you undergo any procedures.         Selective Nerve Root Block Patient Information  Description: Specific nerve roots exit the spinal canal and these nerves can be compressed and inflamed by a bulging disc and bone spurs.  By injecting steroids on the nerve root, we can potentially decrease the inflammation surrounding these nerves, which often leads to decreased pain.  Also, by injecting local anesthesia on the nerve root, this can provide [place] helpful information to give to your referring doctor if it decreases your pain.  Selective nerve root blocks can be done along the spine from the neck to the low back depending on the location of your pain.   After numbing the skin with local anesthesia, a small needle is passed to the nerve root and the position of the needle is verified using x-ray pictures.  After the needle is in correct position, we then deposit the medication.  You may experience a pressure sensation while this is being done.  The entire block usually lasts less than 15 minutes.  Conditions that may be treated with selective nerve root blocks:  Low back and leg pain  Spinal stenosis  Diagnostic block prior to potential surgery  Neck and arm pain  Post laminectomy syndrome  Preparation for the injection:  1. Do not eat any solid food or dairy products within 6 hours of your appointment. 2. You may drink clear liquids up to 2 hours before an appointment.  Clear liquids include water, black coffee, juice or soda.  No milk or cream please. 3. You may take your regular medications, including pain medications, with a sip of water before your appointment.  Diabetics should hold regular insulin (if taken separately) and take 1/2 normal NPH dose the morning of the procedure.  Carry some sugar containing items with you to your appointment. 4. A driver must  accompany you and be prepared to drive you home after your procedure. 5. Bring all your current medications with you. 6. An IV may be inserted and sedation may be given at the discretion of the physician. 7. A blood pressure cuff, EKG, and other monitors will often be applied during the procedure.  Some patients may need to have extra oxygen administered for a short period. 8. You will be asked to provide medical information, including allergies, prior to the procedure.  We must know immediately if you are taking blood  Thinners (like Coumadin) or if you are allergic to IV iodine contrast (dye).  Possible side-effects: All are usually temporary  Bleeding from needle site  Light headedness  Numbness and tingling  Decreased blood pressure  Weakness in arms/legs  Pressure sensation in back/neck  Pain at injection site (several days)  Possible complications: All are extremely rare  Infection  Nerve injury  Spinal headache (a headache wore with upright position)  Call if you experience:  Fever/chills associated with headache or increased back/neck pain  Headache worsened by an upright position  New onset weakness or numbness of an extremity below the injection site  Hives or difficulty breathing (go to the emergency room)  Inflammation or drainage at the injection site(s)  Severe back/neck pain greater than usual  New symptoms which are concerning to you  Please note:  Although the local anesthetic injected can often make your back or neck feel good for several hours after the injection the pain will likely return.  It takes 3-5 days for steroids to work on the nerve root. You may not notice any pain relief for at least one week.  If effective, we will often do a series of 3 injections spaced 3-6 weeks apart to maximally decrease your pain.    If you have any questions, please call [phone] [facility]

## 2015-08-24 ENCOUNTER — Telehealth: Payer: Self-pay | Admitting: *Deleted

## 2015-08-24 NOTE — Telephone Encounter (Signed)
Left voicemail with patient to call our office if she has questions or concerns from procedure.

## 2015-08-31 ENCOUNTER — Encounter: Payer: Self-pay | Admitting: Pain Medicine

## 2015-08-31 ENCOUNTER — Ambulatory Visit: Attending: Pain Medicine | Admitting: Pain Medicine

## 2015-08-31 VITALS — BP 174/90 | HR 86 | Temp 98.7°F | Resp 14 | Ht 63.0 in | Wt 160.0 lb

## 2015-08-31 DIAGNOSIS — G629 Polyneuropathy, unspecified: Secondary | ICD-10-CM | POA: Insufficient documentation

## 2015-08-31 DIAGNOSIS — M961 Postlaminectomy syndrome, not elsewhere classified: Secondary | ICD-10-CM

## 2015-08-31 DIAGNOSIS — M48062 Spinal stenosis, lumbar region with neurogenic claudication: Secondary | ICD-10-CM

## 2015-08-31 DIAGNOSIS — M5126 Other intervertebral disc displacement, lumbar region: Secondary | ICD-10-CM | POA: Diagnosis not present

## 2015-08-31 DIAGNOSIS — M797 Fibromyalgia: Secondary | ICD-10-CM

## 2015-08-31 DIAGNOSIS — M5481 Occipital neuralgia: Secondary | ICD-10-CM

## 2015-08-31 DIAGNOSIS — M5416 Radiculopathy, lumbar region: Secondary | ICD-10-CM

## 2015-08-31 DIAGNOSIS — M545 Low back pain: Secondary | ICD-10-CM | POA: Diagnosis present

## 2015-08-31 DIAGNOSIS — M47816 Spondylosis without myelopathy or radiculopathy, lumbar region: Secondary | ICD-10-CM

## 2015-08-31 DIAGNOSIS — M4806 Spinal stenosis, lumbar region: Secondary | ICD-10-CM | POA: Diagnosis not present

## 2015-08-31 DIAGNOSIS — M79604 Pain in right leg: Secondary | ICD-10-CM | POA: Diagnosis present

## 2015-08-31 DIAGNOSIS — M5136 Other intervertebral disc degeneration, lumbar region: Secondary | ICD-10-CM | POA: Diagnosis not present

## 2015-08-31 DIAGNOSIS — M79605 Pain in left leg: Secondary | ICD-10-CM | POA: Diagnosis present

## 2015-08-31 DIAGNOSIS — M5030 Other cervical disc degeneration, unspecified cervical region: Secondary | ICD-10-CM

## 2015-08-31 DIAGNOSIS — M47896 Other spondylosis, lumbar region: Secondary | ICD-10-CM | POA: Diagnosis not present

## 2015-08-31 DIAGNOSIS — M5137 Other intervertebral disc degeneration, lumbosacral region: Secondary | ICD-10-CM

## 2015-08-31 MED ORDER — HYDROCODONE-ACETAMINOPHEN 10-325 MG PO TABS: ORAL_TABLET | ORAL | Status: DC

## 2015-08-31 MED ORDER — CARISOPRODOL 350 MG PO TABS: ORAL_TABLET | ORAL | Status: DC

## 2015-08-31 MED ORDER — PREGABALIN 50 MG PO CAPS: ORAL_CAPSULE | ORAL | Status: DC

## 2015-08-31 NOTE — Patient Instructions (Addendum)
PLAN   Continue present medication Lyrica  Soma and hydrocodone   Lumbar sympathetic block to be performed at time of return appointment  F/U PCP Dr.Olmedo  for evaliation of  BP and general medical  condition  F/U surgical evaluation. Neurosurgical evaluation as planned  F/U neurological evaluation. May consider pending follow-up evaluations  May consider radiofrequency rhizolysis or intraspinal procedures pending response to present treatment and F/U evaluation   Lumbar Sympathetic Block Patient Information  Description: The lumbar plexus is a group of nerves that are part of the sympathetic nervous system.  These nerves supply organs in the pelvis and legs.  Lumbar sympathetic blocks are utilized for the diagnosis and treatment of painful conditions in these areas.   The lumbar plexus is located on both sides of the aorta at approximately the level of the second lumbar vertebral body.  The block will be performed with you lying on your abdomen with a pillow underneath.  Using direct x-ray guidance,   The plexus will be located on both sides of the spine.  Numbing medicine will be used to deaden the skin prior to needle insertion.  In most cases, a small amount of sedation can be give by IV prior to the numbing medicine.  One or two small needles will be placed near the plexus and local anesthetic will be injected.  This may make your leg(s) feel warm.  The Entire block usually lasts about 15-25 minutes.  Conditions which may be treated by lumbar sympathetic block:   Reflex sympathetic dystrophy  Phantom limb pain  Peripheral neuropathy  Peripheral vascular disease ( inadequate blood flow )  Cancer pain of pelvis, leg and kidney  Preparation for the injection:  1. Do note eat any solid food or diary products within 6 hours of your appointment. 2. You may drink clear liquids up to 2 hours before appointment.  Clear liquids include water, black coffee, juice or soda.  No milk or  cream please. 3. You may take your regular medication, including pain medications, with a sip of water before you appointment.  Diabetics should hold regular insulin ( if taken separately ) and take 1/2 NPH dose the morning of the procedure .  Carry some sugar containing items with you to your appointment. 4. A driver must accompany you and be prepared to drive you home after your procedure. 5. Bring all your current medication with you. 6. An IV may be inserted and sedation may be given at the discretion of the physician.  7. A blood pressure cuff, EKG and other monitors will often be applied during the procedure.  Some patients may need to have extra oxygen administered for a short period. 8. You will be asked to provide medical information, including your allergies and medications, prior to the procedure.  We must know immediately if your taking blood thinners (like Coumadin/Warfarin) or if you are allergic to IV iodine contrast (dye).  We must know if you could possibly be pregnant.  Possible side-effects   Bleeding from needle site or deeper  Infection (rare, can require surgery)  Nerve injury (rare)  Numbness & tingling (temporary)  Collapsed lung (rare)  Spinal headache (a headache worse with upright posture)  Light-headedness (temporary)  Pain at injection site (several days)  Decreased blood pressure (temporary)  Weakness in legs (temporary)  Seizure or other drug reaction (rare)  Call if you experience:   Fever/chills associated with headache or increased back/ neck pain  Headache worsened by an upright  position  New onset weakness or numbness of an extremity below the injection site  Hives or difficulty breathing ( go to the emergency room)  Inflammation or drainage at the injections site(s)  New symptoms which are concerning to you  Please note:  If effective, we will often do a series of 2-3 injections spaced 3-6 weeks apart to maximally decrease your  pain.  If initial series is effective, you may be a candidate for a more permanent block of the lumbar sympathetic plexus.  If you have any questions please call [phone] [facility]   A prescription for HYDROCODONE, SOMA, LYRICA was given to you today.

## 2015-08-31 NOTE — Progress Notes (Signed)
   Subjective:    Patient ID: Jennifer [name], female    DOB: 05-07-1956, 59 y.o.   MRN: [id number]  HPI  The patient is a 59 year old female who returns to pain management for further evaluation and treatment of pain involving the lower back and lower extremity region. Patient states the pain is associated with burning and stinging sensation of the lower extremities which interferes with ability to go to sleep. Patient states that she stays awake most of the night due to the burning stinging sensation of the lower extremities. We discussed patient's condition and will continue present medications and proceed with lumbar sympathetic block at time of return appointment in an attempt to decrease severe burning stinging sensation of the lower extremities, minimize progression of patient's symptoms, and avoid any more involved treatment. The patient was understanding and in agreement with suggested treatment plan.    Review of Systems     Objective:   Physical Exam   There was tenderness of the splenius capitis at the talus musculature region of mild to moderate degree with mild to moderate tenderness at their, clavicular and glenohumeral joint region with unremarkable Spurling's maneuver. Palpation over the thoracic facet thoracic paraspinal muscle study wasn't moderate muscle spasm of the lower thoracic region with no crepitus of the thoracic region noted. Tinel and Phalen's maneuver without increased pain significantly. Palpation over the lumbar paraspinal muscles lumbar facet region. Please moderate discomfort with lateral bending and rotation extension and palpation of the lumbar facets reproducing moderate discomfort. There was moderate tenderness of the gluteal and piriformis musculature regions. There was mild tenderness of the PSIS andP IIS regions. Straight leg raising is limited to approximately 20 with questionable increased pain with dorsiflexion noted. There was sensitivity to touch of the  lower extremities without sensory deficit or dermatomal distribution detected. There was negative clonus negative Homans. The abdomen is nontender with no costovertebral tenderness noted. DTRs were difficult to elicit patient had difficulty relaxing      Assessment & Plan:   Neuropathy of lower extremities  Degenerative disc disease lumbar spine  MRI revealed degenerative changes with prior surgical intervention lumbar region, L5-S1 posterior lumbar interbody fusion, 10 x 12 mm right facet intraspinal synovial cyst, mass effect upon the right lateral thecal sac and mild medial displacement of the S1 nerve root as it abutting the foraminal portion of the L5 nerve root. L4-L5 broad-based disc bulge with moderate bilateral facet arthropathy and mild right foraminal stenosis   Lumbar stenosis with neurogenic claudication  Lumbar radiculopathy  Lumbar facet syndrome   Lumbar stenosis with neurogenic claudication      PLAN   Continue present medication Lyrica  Soma and hydrocodone acetaminophen  Lumbar sympathetic block to be performed at time return appointment  F/U PCP for evaliation of  BP and general medical  condition  F/U surgical evaluation as scheduled  F/U neurological evaluation. May consider pending follow-up evaluations  May consider radiofrequency rhizolysis or intraspinal procedures pending response to present treatment and F/U evaluation   Patient to call Pain Management Center should patient have concerns prior to scheduled return appointment.

## 2015-09-13 ENCOUNTER — Ambulatory Visit: Attending: Pain Medicine | Admitting: Pain Medicine

## 2015-09-13 ENCOUNTER — Encounter: Payer: Self-pay | Admitting: Pain Medicine

## 2015-09-13 VITALS — BP 148/86 | HR 97 | Temp 99.6°F | Resp 16 | Ht 63.0 in | Wt 162.0 lb

## 2015-09-13 DIAGNOSIS — M48062 Spinal stenosis, lumbar region with neurogenic claudication: Secondary | ICD-10-CM

## 2015-09-13 DIAGNOSIS — M5030 Other cervical disc degeneration, unspecified cervical region: Secondary | ICD-10-CM

## 2015-09-13 DIAGNOSIS — M79605 Pain in left leg: Secondary | ICD-10-CM | POA: Insufficient documentation

## 2015-09-13 DIAGNOSIS — M5481 Occipital neuralgia: Secondary | ICD-10-CM

## 2015-09-13 DIAGNOSIS — M47816 Spondylosis without myelopathy or radiculopathy, lumbar region: Secondary | ICD-10-CM

## 2015-09-13 DIAGNOSIS — M797 Fibromyalgia: Secondary | ICD-10-CM

## 2015-09-13 DIAGNOSIS — M5137 Other intervertebral disc degeneration, lumbosacral region: Secondary | ICD-10-CM

## 2015-09-13 DIAGNOSIS — M5416 Radiculopathy, lumbar region: Secondary | ICD-10-CM

## 2015-09-13 DIAGNOSIS — M961 Postlaminectomy syndrome, not elsewhere classified: Secondary | ICD-10-CM

## 2015-09-13 DIAGNOSIS — M79604 Pain in right leg: Secondary | ICD-10-CM | POA: Diagnosis present

## 2015-09-13 MED ORDER — TRIAMCINOLONE ACETONIDE 40 MG/ML IJ SUSP
INTRAMUSCULAR | Status: AC
  Filled 2015-09-13: qty 1

## 2015-09-13 MED ORDER — LIDOCAINE HCL (PF) 1 % IJ SOLN: 10.0000 mL | Freq: Once | INTRAMUSCULAR | Status: DC

## 2015-09-13 MED ORDER — CEFAZOLIN SODIUM 1 G IJ SOLR
INTRAMUSCULAR | Status: AC
  Administered 2015-09-13: 1 g via INTRAVENOUS
  Filled 2015-09-13: qty 10

## 2015-09-13 MED ORDER — MIDAZOLAM HCL 2 MG/2ML IJ SOLN: 5.0000 mg | Freq: Once | INTRAMUSCULAR | Status: DC

## 2015-09-13 MED ORDER — ORPHENADRINE CITRATE 30 MG/ML IJ SOLN
INTRAMUSCULAR | Status: AC
  Filled 2015-09-13: qty 2

## 2015-09-13 MED ORDER — FENTANYL CITRATE (PF) 100 MCG/2ML IJ SOLN
INTRAMUSCULAR | Status: AC
  Administered 2015-09-13: 100 ug via INTRAVENOUS
  Filled 2015-09-13: qty 2

## 2015-09-13 MED ORDER — CEFAZOLIN SODIUM 1-5 GM-% IV SOLN: 1.0000 g | Freq: Once | INTRAVENOUS | Status: DC

## 2015-09-13 MED ORDER — LACTATED RINGERS IV SOLN: 1000.0000 mL | INTRAVENOUS | Status: DC

## 2015-09-13 MED ORDER — BUPIVACAINE HCL (PF) 0.5 % IJ SOLN
INTRAMUSCULAR | Status: AC
  Administered 2015-09-13: 30 mL
  Filled 2015-09-13: qty 30

## 2015-09-13 MED ORDER — FENTANYL CITRATE (PF) 100 MCG/2ML IJ SOLN
100.0000 ug | Freq: Once | INTRAMUSCULAR | Status: AC
  Administered 2015-09-13: 100 ug via INTRAVENOUS

## 2015-09-13 MED ORDER — BUPIVACAINE HCL (PF) 0.5 % IJ SOLN
30.0000 mL | Freq: Once | INTRAMUSCULAR | Status: AC
  Administered 2015-09-13: 30 mL

## 2015-09-13 MED ORDER — CEFUROXIME AXETIL 250 MG PO TABS: 250.0000 mg | ORAL_TABLET | Freq: Two times a day (BID) | ORAL | Status: DC

## 2015-09-13 MED ORDER — MIDAZOLAM HCL 5 MG/5ML IJ SOLN
INTRAMUSCULAR | Status: AC
  Filled 2015-09-13: qty 5

## 2015-09-13 MED ORDER — TRIAMCINOLONE ACETONIDE 40 MG/ML IJ SUSP: 40.0000 mg | Freq: Once | INTRAMUSCULAR | Status: DC

## 2015-09-13 MED ORDER — ORPHENADRINE CITRATE 30 MG/ML IJ SOLN: 60.0000 mg | Freq: Once | INTRAMUSCULAR | Status: DC

## 2015-09-13 NOTE — Progress Notes (Signed)
Safety precautions to be maintained throughout the outpatient stay will include: orient to surroundings, keep bed in low position, maintain call bell within reach at all times, provide assistance with transfer out of bed and ambulation.  

## 2015-09-13 NOTE — Progress Notes (Signed)
Subjective:    Patient ID: Jennifer [name], female    DOB: Mar 15, 1956, 59 y.o.   MRN: [id number]  HPI PROCEDURE PERFORMED: Lumbar sympathetic block.  HISTORY OF PRESENT ILLNESS: The patient is 59 y.o. female who returns to [place] for further evaluation and treatment of pain involving the lower extremities. The patient is with history of prior surgery of the  lumbarregion with pain described as  burning stinging shooting  sensation . There is concern regarding the patient's pain being due to  neuropathy . The risks, benefits, and expectations of the procedure were discussed and explained to the patient who was understanding and wished to proceed with interventional treatment as planned.   DESCRIPTION OF PROCEDURE: Lumbar sympathetic block with IV Versed, IV fentanyl conscious sedation, EKG, blood pressure, pulse, pulse oximetry monitoring. The procedure was performed with the patient in prone position under fluoroscopic guidance.   NEEDLE PLACEMENT AT L2,  right side lumbar sympathetic block: With the patient in prone position and oblique orientation of 20 degrees, Betadine prep and local anesthetic skin wheal of 1.5% lidocaine plain was prepared at the proposed needle entry site. Under fluoroscopic guidance with 20 degrees oblique orientation, the 22 -gauge needle was inserted at the lateral border of the L2 vertebral body on the  right side.   NEEDLE PLACEMENT AT L3, right side lumbar sympathetic block: With the patient in the prone position and oblique orientation of 20 degrees, Betadine prep and local anesthetic skin wheal of 1.5% lidocaine plain was prepared at the proposed needle entry site. Under fluoroscopic guidance with 20 degrees  oblique orientation, the 22 -gauge needle was inserted at the lateral border of the L3 vertebral body on the  right side.   NEEDLE PLACEMENT AT L4, right side lumbar sympathetic block: With the patient in prone position and oblique orientation of  20 degrees, Betadine prep and local anesthetic skin wheal of 1.5% lidocaine plain was prepared at the proposed needle entry site. Under fluoroscopic guidance with 20 degrees  oblique orientation, the 22 -gauge needle was inserted at the lateral border of the L4 vertebral body on the right side.    Following needle placement at the L2, L3 and L4 vertebral body levels on the  right side, needle placement was then verified on lateral view with tip of the needle documented to be in the anterior third of the vertebral body of L2, L3 and L4 respectively. Following negative aspiration of each needle for heme and CSF, L2 vertebral body level needle was injected with 10 mL of 0.25% bupivacaine. L3 vertebral body level needle was injected with 10 mL of 0.25% bupivacaine. L4 vertebral body level was injected with 10 mL of 0.25% bupivacaine. Needles were removed. Please note temperature readings prior to lumbar sympathetic block were noted to be  84.6 degrees Fahrenheit and following completion of the lumbar sympathetic block temperature readings of the lower extremity were noted to be 93.5 degrees Fahrenheit. The patient tolerated the procedure well.  PLAN:   1. Medications: We will continue presently prescribed medications Lyrica Soma and hydrocodone acetaminophen at this time. 2. The patient is to follow-up with primary care physician  Dr. Kym Groom for further evaluation of blood pressure and general medical condition as discussed. 3. Surgical evaluation as discussed. 4. Neurological evaluation as discussed. 5. The patient may be candidate for radiofrequency procedures, implantation devices, and other treatment pending response to treatment and follow-up evaluation. 6. The patient has been advised to adhere to proper  body mechanics. 7. The patient has been advised to call the Pain Management Center prior to scheduled return appointment should there be significant change in condition or have other concerns regarding  condition prior to scheduled return appointment.   The patient was understanding and in agreement with suggested treatment plan.   Review of Systems     Objective:   Physical Exam        Assessment & Plan:

## 2015-09-13 NOTE — Patient Instructions (Addendum)
PLAN   Continue present medication Lyrica and hydrocodone and begin taking antibiotics Ceftin today as prescribed  F/U PCP Dr.Olmedo  for evaliation of  BP and general medical  condition  F/U surgical evaluation. Neurosurgical evaluation as planned  F/U neurological evaluation. May consider pending follow-up evaluations  May consider radiofrequency rhizolysis or intraspinal procedures pending response to present treatment and F/U evaluation   Patient to call Pain Management Center should patient have concerns prior to scheduled return appointment  Pain Management Discharge Instructions  General Discharge Instructions :  If you need to reach your doctor call: Monday-Friday 8:00 am - 4:00 pm at [phone] or toll free [phone].  After clinic hours [phone] to have operator reach doctor.  Bring all of your medication bottles to all your appointments in the pain clinic.  To cancel or reschedule your appointment with Pain Management please remember to call 24 hours in advance to avoid a fee.  Refer to the educational materials which you have been given on: General Risks, I had my Procedure. Discharge Instructions, Post Sedation.  Post Procedure Instructions:  The drugs you were given will stay in your system until tomorrow, so for the next 24 hours you should not drive, make any legal decisions or drink any alcoholic beverages.  You may eat anything you prefer, but it is better to start with liquids then soups and crackers, and gradually work up to solid foods.  Please notify your doctor immediately if you have any unusual bleeding, trouble breathing or pain that is not related to your normal pain.  Depending on the type of procedure that was done, some parts of your body may feel week and/or numb.  This usually clears up by tonight or the next day.  Walk with the use of an assistive device or accompanied by an adult for the 24 hours.  You may use ice on the affected  area for the first 24 hours.  Put ice in a Ziploc bag and cover with a towel and place against area 15 minutes on 15 minutes off.  You may switch to heat after 24 hours.  A prescription for CEFTIN was sent to your pharmacy and should be available for pickup today.

## 2015-09-14 ENCOUNTER — Telehealth: Payer: Self-pay | Admitting: *Deleted

## 2015-09-14 NOTE — Telephone Encounter (Signed)
Spoke with patients son, states that Jennifer [name] is doing good from procedure and verbalizes no problems or concerns.

## 2015-10-02 ENCOUNTER — Ambulatory Visit: Attending: Pain Medicine | Admitting: Pain Medicine

## 2015-10-02 ENCOUNTER — Encounter: Payer: Self-pay | Admitting: Pain Medicine

## 2015-10-02 VITALS — BP 136/94 | HR 92 | Temp 98.8°F | Resp 16 | Ht 63.0 in | Wt 162.0 lb

## 2015-10-02 DIAGNOSIS — M47816 Spondylosis without myelopathy or radiculopathy, lumbar region: Secondary | ICD-10-CM

## 2015-10-02 DIAGNOSIS — M47896 Other spondylosis, lumbar region: Secondary | ICD-10-CM | POA: Diagnosis not present

## 2015-10-02 DIAGNOSIS — M5416 Radiculopathy, lumbar region: Secondary | ICD-10-CM

## 2015-10-02 DIAGNOSIS — M5116 Intervertebral disc disorders with radiculopathy, lumbar region: Secondary | ICD-10-CM | POA: Insufficient documentation

## 2015-10-02 DIAGNOSIS — M5126 Other intervertebral disc displacement, lumbar region: Secondary | ICD-10-CM | POA: Diagnosis not present

## 2015-10-02 DIAGNOSIS — M79604 Pain in right leg: Secondary | ICD-10-CM | POA: Diagnosis present

## 2015-10-02 DIAGNOSIS — M4806 Spinal stenosis, lumbar region: Secondary | ICD-10-CM | POA: Diagnosis not present

## 2015-10-02 DIAGNOSIS — M545 Low back pain: Secondary | ICD-10-CM | POA: Diagnosis present

## 2015-10-02 DIAGNOSIS — M79605 Pain in left leg: Secondary | ICD-10-CM | POA: Diagnosis present

## 2015-10-02 DIAGNOSIS — M5030 Other cervical disc degeneration, unspecified cervical region: Secondary | ICD-10-CM

## 2015-10-02 DIAGNOSIS — M961 Postlaminectomy syndrome, not elsewhere classified: Secondary | ICD-10-CM

## 2015-10-02 DIAGNOSIS — G629 Polyneuropathy, unspecified: Secondary | ICD-10-CM | POA: Diagnosis not present

## 2015-10-02 DIAGNOSIS — M5137 Other intervertebral disc degeneration, lumbosacral region: Secondary | ICD-10-CM

## 2015-10-02 DIAGNOSIS — M48062 Spinal stenosis, lumbar region with neurogenic claudication: Secondary | ICD-10-CM

## 2015-10-02 DIAGNOSIS — M797 Fibromyalgia: Secondary | ICD-10-CM

## 2015-10-02 DIAGNOSIS — M51379 Other intervertebral disc degeneration, lumbosacral region without mention of lumbar back pain or lower extremity pain: Secondary | ICD-10-CM

## 2015-10-02 DIAGNOSIS — M5481 Occipital neuralgia: Secondary | ICD-10-CM

## 2015-10-02 MED ORDER — HYDROCODONE-ACETAMINOPHEN 10-325 MG PO TABS: ORAL_TABLET | ORAL | Status: DC

## 2015-10-02 MED ORDER — CARISOPRODOL 350 MG PO TABS: ORAL_TABLET | ORAL | Status: DC

## 2015-10-02 MED ORDER — PREGABALIN 50 MG PO CAPS: ORAL_CAPSULE | ORAL | Status: DC

## 2015-10-02 NOTE — Progress Notes (Signed)
Subjective:    Patient ID: Jennifer [name], female    DOB: 1956/09/18, 59 y.o.   MRN: [id number]  HPI the patient is a 59 year old female who returns to pain management for further evaluation and treatment of pain involving the lower back and lower extremity region. The patient states she has had improvement of lower back and lower extremity pain with treatment in pain management Center. At the present time patient is with burning stinging throbbing pain of the lower extremities aggravated with prolonged standing and walking. We will have patient undergo neurosurgical reevaluation as well as prescribed TENS unit for patient and will proceed with lumbosacral selective nerve root block at time return appointment in attempt to decrease severity of symptoms, minimize progression of symptoms, and avoid need for more involved treatment. The patient is understanding and agreed to suggested treatment plan. We will continue Lyrica and hydrocodone acetaminophen as well as Soma as prescribed this time.. The patient agreed to suggested treatment plan.   Review of Systems     Objective:   Physical Exam  Therapist there was tends to palpation of the paraspinal muscular treat and cervical region cervical facet region a mild to moderate degree. There appeared to be unremarkable Spurling's maneuver. There was tenderness over the splenius capitis and occipitalis regions of mild degree. Patient appeared to be with with tenderness to palpation of the acromioclavicular and glenohumeral joint regions of mild degree. There was tenderness over the thoracic facet thoracic paraspinal musculature regions of mild degree. Patient appeared to be with bilaterally equal grip strength and Tinel and Phalen's maneuver were without increased pain of significant degree. Palpation over the thoracic region thoracic facet region was attends to palpation of moderate degree the lower thoracic region with moderate muscle spasms noted.  Palpation over the lumbar paraspinal musculature region lumbar facet region was with moderate to moderately severe discomfort. Lateral bending rotation extension and palpation over the lumbar facets reproduced moderately severe discomfort. There was tenderness to palpation over the PSIS and PII S regions a moderate degree. EHL strength appeared to be decreased. There was questionably decreased sensation along the L5 dermatomal distribution and negative clonus negative Homans. There was mild tenderness to palpation over the greater trochanteric region and iliotibial band region. Straight leg raise was tolerates approximately 20 without a definite increased pain with dorsiflexion noted. There was negative clonus negative Homans. Abdomen nontender with no costovertebral tenderness noted.          Assessment & Plan:     Neuropathy of lower extremities  Degenerative disc disease lumbar spine  MRI revealed degenerative changes with prior surgical intervention lumbar region, L5-S1 posterior lumbar interbody fusion, 10 x 12 mm right facet intraspinal synovial cyst, mass effect upon the right lateral thecal sac and mild medial displacement of the S1 nerve root as it abutting the foraminal portion of the L5 nerve root. L4-L5 broad-based disc bulge with moderate bilateral facet arthropathy and mild right foraminal stenosis   Lumbar stenosis with neurogenic claudication  Lumbar radiculopathy  Lumbar facet syndrome   Lumbar stenosis with neurogenic claudication     PLAN   Continue present medication Lyrica Soma and hydrocodone  Lumbosacral selective nerve root block to be performed at time return appointment  F/U PCP Dr.Olmedo  for evaliation of  BP and general medical  condition  F/U surgical evaluation. Neurosurgical evaluation as planned Please ask Shatoya  Date of neurosurgical evaluation  TENS unit  Please ask receptionists to assist you with obtaining TENS  unit which has been  ordered  F/U neurological evaluation. May consider pending follow-up evaluations with Dr. Melrose Nakayama as discussed  May consider radiofrequency rhizolysis or intraspinal procedures pending response to present treatment and F/U evaluation   Patient to call Pain Management Center should patient have concerns prior to scheduled return appointment.

## 2015-10-02 NOTE — Progress Notes (Signed)
Safety precautions to be maintained throughout the outpatient stay will include: orient to surroundings, keep bed in low position, maintain call bell within reach at all times, provide assistance with transfer out of bed and ambulation.  

## 2015-10-02 NOTE — Patient Instructions (Addendum)
PLAN   Continue present medication Lyrica Soma and hydrocodone  Lumbosacral selective nerve root block to be performed at time return appointment  F/U PCP Dr.Olmedo  for evaliation of  BP and general medical  condition  F/U surgical evaluation. Neurosurgical evaluation as planned Please ask Shatoya  Date of neurosurgical evaluation  TENS unit  Please ask receptionists to assist you with obtaining TENS unit which has been ordered  F/U neurological evaluation. May consider pending follow-up evaluations with Dr. Melrose Nakayama as discussed  May consider radiofrequency rhizolysis or intraspinal procedures pending response to present treatment and F/U evaluation   Patient to call Pain Management Center should patient have concerns prior to scheduled return appointment.

## 2015-10-13 ENCOUNTER — Other Ambulatory Visit: Payer: Self-pay | Admitting: Pain Medicine

## 2015-10-18 ENCOUNTER — Encounter: Payer: Self-pay | Admitting: Pain Medicine

## 2015-10-18 ENCOUNTER — Ambulatory Visit: Attending: Pain Medicine | Admitting: Pain Medicine

## 2015-10-18 VITALS — BP 143/70 | HR 94 | Temp 98.3°F | Resp 16 | Ht 63.0 in | Wt 163.0 lb

## 2015-10-18 DIAGNOSIS — M1288 Other specific arthropathies, not elsewhere classified, other specified site: Secondary | ICD-10-CM | POA: Diagnosis not present

## 2015-10-18 DIAGNOSIS — M545 Low back pain: Secondary | ICD-10-CM | POA: Diagnosis present

## 2015-10-18 DIAGNOSIS — M5481 Occipital neuralgia: Secondary | ICD-10-CM

## 2015-10-18 DIAGNOSIS — M5030 Other cervical disc degeneration, unspecified cervical region: Secondary | ICD-10-CM

## 2015-10-18 DIAGNOSIS — M79605 Pain in left leg: Secondary | ICD-10-CM | POA: Diagnosis present

## 2015-10-18 DIAGNOSIS — M5416 Radiculopathy, lumbar region: Secondary | ICD-10-CM

## 2015-10-18 DIAGNOSIS — M48062 Spinal stenosis, lumbar region with neurogenic claudication: Secondary | ICD-10-CM

## 2015-10-18 DIAGNOSIS — M47816 Spondylosis without myelopathy or radiculopathy, lumbar region: Secondary | ICD-10-CM | POA: Diagnosis not present

## 2015-10-18 DIAGNOSIS — M79604 Pain in right leg: Secondary | ICD-10-CM | POA: Diagnosis present

## 2015-10-18 DIAGNOSIS — M961 Postlaminectomy syndrome, not elsewhere classified: Secondary | ICD-10-CM

## 2015-10-18 DIAGNOSIS — M797 Fibromyalgia: Secondary | ICD-10-CM

## 2015-10-18 DIAGNOSIS — M5137 Other intervertebral disc degeneration, lumbosacral region: Secondary | ICD-10-CM

## 2015-10-18 MED ORDER — FENTANYL CITRATE (PF) 100 MCG/2ML IJ SOLN
INTRAMUSCULAR | Status: AC
  Administered 2015-10-18: 100 ug via INTRAVENOUS
  Filled 2015-10-18: qty 2

## 2015-10-18 MED ORDER — CEFAZOLIN SODIUM 1-5 GM-% IV SOLN: 1.0000 g | Freq: Once | INTRAVENOUS | Status: DC

## 2015-10-18 MED ORDER — BUPIVACAINE HCL (PF) 0.25 % IJ SOLN
INTRAMUSCULAR | Status: AC
  Administered 2015-10-18: 12:00:00
  Filled 2015-10-18: qty 30

## 2015-10-18 MED ORDER — MIDAZOLAM HCL 5 MG/5ML IJ SOLN
INTRAMUSCULAR | Status: AC
  Administered 2015-10-18: 4 mg via INTRAVENOUS
  Filled 2015-10-18: qty 5

## 2015-10-18 MED ORDER — BUPIVACAINE HCL (PF) 0.25 % IJ SOLN: 30.0000 mL | Freq: Once | INTRAMUSCULAR | Status: DC

## 2015-10-18 MED ORDER — LIDOCAINE HCL (PF) 1 % IJ SOLN
10.0000 mL | Freq: Once | INTRAMUSCULAR | Status: DC
Start: 2015-10-18 — End: 2016-11-29

## 2015-10-18 MED ORDER — MIDAZOLAM HCL 5 MG/5ML IJ SOLN: 5.0000 mg | Freq: Once | INTRAMUSCULAR | Status: DC

## 2015-10-18 MED ORDER — TRIAMCINOLONE ACETONIDE 40 MG/ML IJ SUSP
INTRAMUSCULAR | Status: AC
  Administered 2015-10-18: 12:00:00
  Filled 2015-10-18: qty 1

## 2015-10-18 MED ORDER — ORPHENADRINE CITRATE 30 MG/ML IJ SOLN
INTRAMUSCULAR | Status: AC
  Administered 2015-10-18: 12:00:00
  Filled 2015-10-18: qty 2

## 2015-10-18 MED ORDER — ORPHENADRINE CITRATE 30 MG/ML IJ SOLN: 60.0000 mg | Freq: Once | INTRAMUSCULAR | Status: DC

## 2015-10-18 MED ORDER — CEFAZOLIN SODIUM 1 G IJ SOLR
INTRAMUSCULAR | Status: AC
  Administered 2015-10-18: 1 g via INTRAVENOUS
  Filled 2015-10-18: qty 10

## 2015-10-18 MED ORDER — LACTATED RINGERS IV SOLN: 1000.0000 mL | INTRAVENOUS | Status: DC

## 2015-10-18 MED ORDER — SODIUM CHLORIDE 0.9 % IJ SOLN: 20.0000 mL | Freq: Once | INTRAMUSCULAR | Status: DC

## 2015-10-18 MED ORDER — TRIAMCINOLONE ACETONIDE 40 MG/ML IJ SUSP: 40.0000 mg | Freq: Once | INTRAMUSCULAR | Status: DC

## 2015-10-18 MED ORDER — FENTANYL CITRATE (PF) 100 MCG/2ML IJ SOLN: 100.0000 ug | Freq: Once | INTRAMUSCULAR | Status: DC

## 2015-10-18 MED ORDER — CEFUROXIME AXETIL 250 MG PO TABS: 250.0000 mg | ORAL_TABLET | Freq: Two times a day (BID) | ORAL | Status: DC

## 2015-10-18 NOTE — Progress Notes (Signed)
   Subjective:    Patient ID: Jennifer [name], female    DOB: 1956-10-10, 59 y.o.   MRN: [id number]  HPI PROCEDURE PERFORMED: Lumbosacral selective nerve root block   NOTE: The patient is a 59 y.o. female who returns to [place] for further evaluation and treatment of pain involving the lumbar and lower extremity region. Studies consisting of  MRI has revealed the patient to be with evidence of  Multilevel degenerative changes of the lumbar  Spine with right-sided synovial cyst and facet arthropathy causing moderate impingement at L5-S1 with disc bulging and facet arthropathy impingement at L4-5 There is concern regarding intraspinal abnormalities contributing to the patient's symptomatology. The risks, benefits, and expectations of the procedure have been explained to the patient who was understanding and in agreement with suggested treatment plan. We will proceed with interventional treatment as discussed and as explained to the patient. The patient is understanding and in agreement with suggested treatment plan.   DESCRIPTION OF PROCEDURE: Lumbosacral selective nerve root block with IV Versed, IV fentanyl conscious sedation, EKG, blood pressure, pulse, and pulse oximetry monitoring. The procedure was performed with the patient in the prone position under fluoroscopic guidance. With the patient in the prone position, Betadine prep of proposed entry site was performed. Local anesthetic skin wheal of proposed needle entry site was prepared with 1.5% plain lidocaine with AP view of the lumbosacral spine.   PROCEDURE #1: Needle placement at the  right L 2 vertebral body: A 22 -gauge needle was inserted at the inferior border of the transverse process of the vertebral body with needle placed medial to the midline of the transverse process on AP view of the lumbosacral spine.   NEEDLE PLACEMENT AT   L3, L4, and L5  VERTEBRAL BODY LEVELS  Needle  placement was accomplished at  L3, L4, and L5   vertebral body levels on the  right side exactly as was accomplished at the  L2  vertebral body level  and utilizing the same technique and under fluoroscopic guidance.   Needle placement was then verified on lateral view at all levels with needle tip documented to be in the posterior superior quadrant of the intervertebral foramen of  L  2, L3, L4, and L5. Following negative aspiration for heme and CSF at each level, each level was injected with 3 mL of 0.25% bupivacaine with Kenalog.    The patient tolerated the procedure well. A total of 10 mg of Kenalog was utilized for the procedure.   PLAN:  1. Medications: Will continue presently prescribed medications. Lyrica and hydrocodone acetaminophen 2. The patient is to undergo follow-up evaluation with PCP   Dr.Olmedo for evaluation of blood pressure and general medical condition status post procedure performed on today's visit. 3. Surgical follow-up evaluation  Has been addressed 4. Neurological evaluation. May consider PNCV/EMG studies 5. May consider radiofrequency procedures, implantation type procedures and other treatment pending response to treatment and follow-up evaluation. 6. The patient has been advise do adhere to proper body mechanics and avoid activities which may aggravate condition. 7. The patient has been advised to call the Pain Management Center prior to scheduled return appointment should there be significant change in the patient's condition or should the patient have other concerns regarding condition prior to scheduled return appointment.    Review of Systems     Objective:   Physical Exam        Assessment & Plan:

## 2015-10-18 NOTE — Patient Instructions (Addendum)
PLAN   Continue present medication Lyrica and hydrocodone and begin taking antibiotics Ceftin today as prescribed  F/U PCP Dr.Olmedo  for evaliation of  BP and general medical  condition  F/U surgical evaluation. Neurosurgical evaluation as planned  F/U neurological evaluation. May consider pending follow-up evaluations  May consider radiofrequency rhizolysis or intraspinal procedures pending response to present treatment and F/U evaluation   Patient to call Pain Management Center should patient have concerns prior to scheduled return appointment  Pain Management Discharge Instructions  General Discharge Instructions :  If you need to reach your doctor call: Monday-Friday 8:00 am - 4:00 pm at [phone] or toll free [phone].  After clinic hours [phone] to have operator reach doctor.  Bring all of your medication bottles to all your appointments in the pain clinic.  To cancel or reschedule your appointment with Pain Management please remember to call 24 hours in advance to avoid a fee.  Refer to the educational materials which you have been given on: General Risks, I had my Procedure. Discharge Instructions, Post Sedation.  Post Procedure Instructions:  The drugs you were given will stay in your system until tomorrow, so for the next 24 hours you should not drive, make any legal decisions or drink any alcoholic beverages.  You may eat anything you prefer, but it is better to start with liquids then soups and crackers, and gradually work up to solid foods.  Please notify your doctor immediately if you have any unusual bleeding, trouble breathing or pain that is not related to your normal pain.  Depending on the type of procedure that was done, some parts of your body may feel week and/or numb.  This usually clears up by tonight or the next day.  Walk with the use of an assistive device or accompanied by an adult for the 24 hours.  You may use ice on the affected  area for the first 24 hours.  Put ice in a Ziploc bag and cover with a towel and place against area 15 minutes on 15 minutes off.  You may switch to heat after 24 hours.  A prescription for ceftin was sent to your pharmacy and should be available for pickup today.

## 2015-10-18 NOTE — Progress Notes (Signed)
Safety precautions to be maintained throughout the outpatient stay will include: orient to surroundings, keep bed in low position, maintain call bell within reach at all times, provide assistance with transfer out of bed and ambulation.  

## 2015-10-19 ENCOUNTER — Telehealth: Payer: Self-pay

## 2015-10-19 NOTE — Telephone Encounter (Signed)
Post procedure call.  Family member states patient is doing OK.  States patient is laying down but would give her the message to call us if needed.

## 2015-10-24 NOTE — Progress Notes (Signed)
This was a refill  

## 2015-10-31 ENCOUNTER — Ambulatory Visit: Attending: Pain Medicine | Admitting: Pain Medicine

## 2015-10-31 ENCOUNTER — Encounter: Payer: Self-pay | Admitting: Pain Medicine

## 2015-10-31 ENCOUNTER — Telehealth: Payer: Self-pay | Admitting: *Deleted

## 2015-10-31 VITALS — BP 149/75 | HR 89 | Temp 98.7°F | Resp 16 | Ht 63.0 in | Wt 163.0 lb

## 2015-10-31 DIAGNOSIS — G629 Polyneuropathy, unspecified: Secondary | ICD-10-CM | POA: Diagnosis not present

## 2015-10-31 DIAGNOSIS — M51379 Other intervertebral disc degeneration, lumbosacral region without mention of lumbar back pain or lower extremity pain: Secondary | ICD-10-CM

## 2015-10-31 DIAGNOSIS — M5116 Intervertebral disc disorders with radiculopathy, lumbar region: Secondary | ICD-10-CM | POA: Diagnosis not present

## 2015-10-31 DIAGNOSIS — M5137 Other intervertebral disc degeneration, lumbosacral region: Secondary | ICD-10-CM

## 2015-10-31 DIAGNOSIS — M5030 Other cervical disc degeneration, unspecified cervical region: Secondary | ICD-10-CM

## 2015-10-31 DIAGNOSIS — M47816 Spondylosis without myelopathy or radiculopathy, lumbar region: Secondary | ICD-10-CM

## 2015-10-31 DIAGNOSIS — M4806 Spinal stenosis, lumbar region: Secondary | ICD-10-CM | POA: Insufficient documentation

## 2015-10-31 DIAGNOSIS — M5126 Other intervertebral disc displacement, lumbar region: Secondary | ICD-10-CM | POA: Insufficient documentation

## 2015-10-31 DIAGNOSIS — M47896 Other spondylosis, lumbar region: Secondary | ICD-10-CM | POA: Diagnosis not present

## 2015-10-31 DIAGNOSIS — M545 Low back pain: Secondary | ICD-10-CM | POA: Diagnosis present

## 2015-10-31 DIAGNOSIS — M5416 Radiculopathy, lumbar region: Secondary | ICD-10-CM

## 2015-10-31 DIAGNOSIS — M48062 Spinal stenosis, lumbar region with neurogenic claudication: Secondary | ICD-10-CM

## 2015-10-31 DIAGNOSIS — M797 Fibromyalgia: Secondary | ICD-10-CM

## 2015-10-31 DIAGNOSIS — M961 Postlaminectomy syndrome, not elsewhere classified: Secondary | ICD-10-CM

## 2015-10-31 DIAGNOSIS — M5481 Occipital neuralgia: Secondary | ICD-10-CM

## 2015-10-31 MED ORDER — PREGABALIN 50 MG PO CAPS: ORAL_CAPSULE | ORAL | Status: DC

## 2015-10-31 MED ORDER — CARISOPRODOL 350 MG PO TABS: ORAL_TABLET | ORAL | Status: DC

## 2015-10-31 MED ORDER — HYDROCODONE-ACETAMINOPHEN 10-325 MG PO TABS: ORAL_TABLET | ORAL | Status: DC

## 2015-10-31 NOTE — Progress Notes (Signed)
Safety precautions to be maintained throughout the outpatient stay will include: orient to surroundings, keep bed in low position, maintain call bell within reach at all times, provide assistance with transfer out of bed and ambulation.  

## 2015-10-31 NOTE — Telephone Encounter (Signed)
Spoke with Jennifer [name] to let her know that Physical therapy from [facility] main campus would be contacting her to setup appt for TENS unit evaluation.

## 2015-10-31 NOTE — Patient Instructions (Signed)
PLAN   Continue present medication Lyrica Soma and hydrocodone  Lumbosacral selective nerve root block to be performed at time return appointment  F/U PCP Dr.Olmedo  for evaliation of  BP and general medical  condition  F/U surgical evaluation. Neurosurgical evaluation as planned Please ask Shatoya  Date of neurosurgical evaluation  TENS unit  Please ask receptionists to assist you with obtaining TENS unit which has been ordered  F/U neurological evaluation. May consider pending follow-up evaluations with Dr. Melrose Nakayama as discussed  May consider radiofrequency rhizolysis or intraspinal procedures pending response to present treatment and F/U evaluation   Patient to call Pain Management Center should patient have concerns prior to scheduled return appointment.

## 2015-10-31 NOTE — Progress Notes (Signed)
Subjective:    Patient ID: Jennifer [name], female    DOB: 1956-06-08, 60 y.o.   MRN: [id number]  HPI  The patient is a 60 year old female who returns to pain management for further evaluation and treatment of pain involving the lower back and lower extremity region predominantly. The patient is had improvement with previous lumbosacral selective nerve root block. The patient has shown gradual improvement with the procedure. We discussed patient undergoing surgical evaluation and will request neurosurgical reevaluation at this time and began. We will also proceed with lumbosacral selective nerve root block at time return appointment in attempt to decrease patient's pain more significantly. The patient denies trauma change in events of daily living because change in symptomatology. The patient continues her medications consisting of Lyrica Soma and code on with some benefit as well. We will proceed with lumbosacral selective nerve root block at time return appointment and will schedule patient for neurosurgical evaluation of her lower back and lower extremity pain and consider patient for additional modifications of treatment pending follow-up evaluation. The patient agreed to suggested treatment plan.    Review of Systems     Objective:   Physical Exam  There was tenderness to palpation of the splenius capitis and occipitalis musculature region of mild degree with mild tenderness over the splenius capitis and occipitalis musculature regions on the left as well as on the right and mild tends to palpation over the cervical facet cervical paraspinal musculature region on the left as well as on the right. There appeared to be unremarkable Spurling's maneuver and patient appeared to be with bilaterally equal grip strength with Tinel and Phalen's maneuver reproducing minimal discomfort. There was tenderness to palpation over the region of the thoracic facet thoracic paraspinal musculature region a mild  degree. There was mild tenderness to palpation over the upper thoracic paraspinal musculature region and moderate tends to palpation over the lower thoracic paraspinal musculature region with no crepitus of the thoracic region noted. Palpation over the lumbar paraspinal musculatures and lumbar facet region was with moderate to moderately severe tenderness to palpation with lateral bending rotation extension and palpation of the lumbar facets reproducing moderate discomfort. There appeared to be negative clonus negative Homans. Straight leg raising was tolerates approximately 20 with questionable increase of pain with dorsiflexion noted. DTRs appeared to be trace at the knees. There was questionably decreased sensation along the L5 dermatomal distribution with negative clonus and negative Homans. Palpation over the PSIS and PII S region reproduced moderate discomfort. Palpation over the greater trochanteric region and iliotibial band region reproduced mild to moderate discomfort. Abdomen was nontender with no costovertebral angle tenderness noted.    Assessment & Plan:          Neuropathy of lower extremities  Degenerative disc disease lumbar spine  MRI revealed degenerative changes with prior surgical intervention lumbar region, L5-S1 posterior lumbar interbody fusion, 10 x 12 mm right facet intraspinal synovial cyst, mass effect upon the right lateral thecal sac and mild medial displacement of the S1 nerve root as it abutting the foraminal portion of the L5 nerve root. L4-L5 broad-based disc bulge with moderate bilateral facet arthropathy and mild right foraminal stenosis   Lumbar stenosis with neurogenic claudication  Lumbar radiculopathy  Lumbar facet syndrome   Lumbar stenosis with neurogenic claudication       PLAN   Continue present medication Lyrica Soma and hydrocodone  Lumbosacral selective nerve root block to be performed at time return appointment  F/U PCP  Dr.Olmedo   for evaliation of  BP and general medical  condition  F/U surgical evaluation. Neurosurgical evaluation as planned Please ask Shatoya  Date of neurosurgical evaluation  TENS unit  Please ask receptionists to assist you with obtaining TENS unit which has been ordered  F/U neurological evaluation. May consider pending follow-up evaluations with Dr. Melrose Nakayama as discussed  May consider radiofrequency rhizolysis or intraspinal procedures pending response to present treatment and F/U evaluation   Patient to call Pain Management Center should patient have concerns prior to scheduled return appointment.

## 2015-11-06 ENCOUNTER — Ambulatory Visit: Admitting: Physical Therapy

## 2015-11-07 ENCOUNTER — Ambulatory Visit: Admitting: Psychiatry

## 2015-11-10 ENCOUNTER — Ambulatory Visit: Attending: Pain Medicine | Admitting: Physical Therapy

## 2015-11-14 ENCOUNTER — Other Ambulatory Visit: Payer: Self-pay

## 2015-11-14 MED ORDER — ZOLPIDEM TARTRATE 10 MG PO TABS: 10.0000 mg | ORAL_TABLET | Freq: Every day | ORAL | Status: DC

## 2015-11-14 NOTE — Telephone Encounter (Signed)
called pt . told that 8 tablets was faxed into [facility]

## 2015-11-14 NOTE — Telephone Encounter (Signed)
rx faxed - confirmed  ambien 10mg  take 1 tablet at bedtime #8   id# [id number] order # [id number]

## 2015-11-14 NOTE — Telephone Encounter (Signed)
pt out of ambien. pt made appt for 11-21-15 pt needs enough medication to get to appt.

## 2015-11-21 ENCOUNTER — Encounter: Payer: Self-pay | Admitting: Psychiatry

## 2015-11-21 ENCOUNTER — Ambulatory Visit (INDEPENDENT_AMBULATORY_CARE_PROVIDER_SITE_OTHER): Admitting: Psychiatry

## 2015-11-21 VITALS — BP 128/80 | HR 119 | Temp 98.0°F | Ht 63.0 in | Wt 166.6 lb

## 2015-11-21 DIAGNOSIS — F3130 Bipolar disorder, current episode depressed, mild or moderate severity, unspecified: Secondary | ICD-10-CM

## 2015-11-21 MED ORDER — ALPRAZOLAM 0.5 MG PO TABS: 0.5000 mg | ORAL_TABLET | Freq: Every evening | ORAL | Status: DC | PRN

## 2015-11-21 MED ORDER — LITHIUM CARBONATE 300 MG PO CAPS: 300.0000 mg | ORAL_CAPSULE | Freq: Every day | ORAL | Status: DC

## 2015-11-21 MED ORDER — QUETIAPINE FUMARATE 300 MG PO TABS: 300.0000 mg | ORAL_TABLET | Freq: Every day | ORAL | Status: DC

## 2015-11-21 MED ORDER — ZOLPIDEM TARTRATE 5 MG PO TABS: 5.0000 mg | ORAL_TABLET | Freq: Every day | ORAL | Status: DC

## 2015-11-21 MED ORDER — VILAZODONE HCL 40 MG PO TABS: 40.0000 mg | ORAL_TABLET | Freq: Every day | ORAL | Status: DC

## 2015-11-21 NOTE — Progress Notes (Signed)
BH MD/PA/NP OP Progress Note  11/21/2015 1:39 PM Jennifer [name]  MRN:  [id number]  Subjective:    Patient is a 60 year old married female who presented for the follow-up appointment. She reported that she is spending time with her family and is taking care of her grandchildren. She reported that she has been feeling depressed and has an empty feelings. She wants to discuss those feelings with a therapist. Patient reported that her husband does not believe in mental health treatment. However she feels like that if she will start seeing a therapist she might feel better. Patient currently denied having any suicidal ideations or plans. She is on a combination of lithium and Seroquel at this time. She also takes Ambien after she takes the Seroquel at night. She currently denied having any adverse effects to the medication. She takes Xanax on necessary basis. She appeared anxious during the interview. Patient reported that she is going to have another grandchild in March. She is looking forward to have another baby in the family. We discussed about her medications in detail. She is also going to have the lab work done with her primary care physician at [facility]. She appears receptive during the interview. . She denied having any mood swings anger anxiety at this time. [facility]   Chief Complaint:  Chief Complaint    Follow-up; Medication Refill; Fatigue; Stress; Depression     Visit Diagnosis:     ICD-9-CM ICD-10-CM   1. Bipolar I disorder, most recent episode depressed ([place]) 296.50 F31.30     Past Medical History:  Past Medical History  Diagnosis Date  . GERD (gastroesophageal reflux disease)   . Hyperlipidemia   . Hypertension   . Enlarged liver   . Diabetes mellitus   . Low back pain   . Depression with anxiety   . CAD (coronary artery disease)   . Von Willebrand disease ([place])   . Migraines   . Depression   . Persistent  disorder of initiating or maintaining sleep   . Generalized anxiety disorder   . Bipolar depression ([place])   . Anxiety   . Neuropathy [facility])     Past Surgical History  Procedure Laterality Date  . Cardiac catheterization      CAD,PCI of LAD, Cypher stent 2.5-28mm  . Cardiac catheterization      PCI of mid-LAD in stent restenosis with a drug-eluting stent  . Breast implant removal    . Coronary artery bypass graft   08-12-2011    CABG x 3  . Cardiac catheterization  06/03/13    [facility];MID LAD 100 % STENOSIS; PRIOR STENT; MID RCA 40 % STENOSIS  . Coronary stent placement    . Lumbar fusion      L1-S1  . Abdominal hysterectomy    . Back surgery     Family History:  Family History  Problem Relation Age of Onset  . Hypertension Other     family hx  . Hyperlipidemia Other     family hx  . Diabetes Other     family hx  . Aneurysm Father     abdominal  . Diabetes Father   . Alcohol abuse Father   . Hypertension Father   . Aneurysm Brother     abdominal  . Alcohol abuse Brother   . Hypertension Brother   . Diabetes Mother   . Alcohol abuse Mother    Social History:  Social History  Social History  . Marital Status: Married    Spouse Name: N/A  . Number of Children: N/A  . Years of Education: N/A   Social History Main Topics  . Smoking status: Former Smoker -- 16 years    Types: Cigarettes    Quit date: 06/20/2010  . Smokeless tobacco: Never Used  . Alcohol Use: No  . Drug Use: No  . Sexual Activity: Yes   Other Topics Concern  . None   Social History Narrative   Additional History:  Lives with her family members and has good relationship with them.  Assessment:   Musculoskeletal: Strength & Muscle Tone: within normal limits Gait & Station: normal Patient leans: N/A  Psychiatric Specialty Exam: Depression        Associated symptoms include insomnia and myalgias.  Associated symptoms include no suicidal ideas.   Review of Systems  Constitutional:  Positive for malaise/fatigue.  HENT: Negative.  Negative for nosebleeds and tinnitus.   Eyes: Negative.  Negative for photophobia.  Respiratory: Negative.   Cardiovascular: Negative.  Negative for palpitations.  Gastrointestinal: Negative for nausea and diarrhea.  Genitourinary: Negative for frequency.  Musculoskeletal: Positive for myalgias and back pain.  Skin: Negative.   Neurological: Negative for tremors.  Endo/Heme/Allergies: Negative for environmental allergies.  Psychiatric/Behavioral: Positive for depression. Negative for suicidal ideas and substance abuse. The patient is nervous/anxious and has insomnia.   All other systems reviewed and are negative.   Blood pressure 128/80, pulse 119, temperature 98 F (36.7 C), temperature source Tympanic, height '5\' 3"'  (1.6 m), weight 166 lb 9.6 oz (75.569 kg), SpO2 95 %.Body mass index is 29.52 kg/(m^2).  General Appearance: Casual  Eye Contact:  Fair  Speech:  Slow  Volume:  Decreased  Mood:  Anxious and Depressed  Affect:  Congruent  Thought Process:  Logical  Orientation:  Full (Time, Place, and Person)  Thought Content:  WDL  Suicidal Thoughts:  No  Homicidal Thoughts:  No  Memory:  Immediate;   Fair  Judgement:  Fair  Insight:  Fair  Psychomotor Activity:  Normal  Concentration:  Fair  Recall:  [employer] of Knowledge: Fair  Language: Fair  Akathisia:  No  Handed:  Right  AIMS (if indicated):    Assets:  Communication Skills Desire for Improvement Housing Social Support  ADL's:  Intact  Cognition: WNL  Sleep:  good   Is the patient at risk to self?  No. Has the patient been a risk to self in the past 6 months?  No. Has the patient been a risk to self within the distant past?  Yes.   Is the patient a risk to others?  No. Has the patient been a risk to others in the past 6 months?  No. Has the patient been a risk to others within the distant past?  No.  Current Medications: Current Outpatient Prescriptions   Medication Sig Dispense Refill  . albuterol-ipratropium (COMBIVENT) 18-103 MCG/ACT inhaler Inhale into the lungs as needed for wheezing or shortness of breath.    . ALPRAZolam (XANAX) 0.5 MG tablet Take 1 tablet (0.5 mg total) by mouth at bedtime as needed for anxiety. 30 tablet 2  . amLODipine (NORVASC) 5 MG tablet Take 1 tablet (5 mg total) by mouth daily. 90 tablet 3  . atorvastatin (LIPITOR) 40 MG tablet Take 1 tablet (40 mg total) by mouth daily. 90 tablet 3  . Blood Glucose Monitoring Suppl (FREESTYLE FREEDOM LITE) W/DEVICE KIT Use 3 (three) times daily.  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 Dx Code: 250.00    . carisoprodol (SOMA) 350 MG tablet Name 1 tablet by mouth per day or twice per day if tolerated 60 tablet 0  . carvedilol (COREG) 12.5 MG tablet Take 1 tablet (12.5 mg total) by mouth 2 (two) times daily with a meal. 180 tablet 3  . cefUROXime (CEFTIN) 250 MG tablet Take 1 tablet (250 mg total) by mouth 2 (two) times daily with a meal. 14 tablet 0  . Cholecalciferol (VITAMIN D) 2000 UNITS tablet Take 2,000 Units by mouth daily.    . fenofibrate micronized (LOFIBRA) 134 MG capsule Take 134 mg by mouth daily before breakfast.    . fluticasone (FLONASE) 50 MCG/ACT nasal spray Place into the nose.    . fluticasone (FLOVENT HFA) 110 MCG/ACT inhaler Inhale into the lungs.    Marland Kitchen HYDROcodone-acetaminophen (NORCO) 10-325 MG tablet Limit 3-6 tabs by mouth per day if tolerated   ( NO OXYCODONE ) 180 tablet 0  . Insulin Isophane & Regular (HUMULIN 70/30 PEN [place]) Inject 27 Units into the skin 2 (two) times daily.     . isosorbide mononitrate (IMDUR) 60 MG 24 hr tablet     . lisinopril (PRINIVIL,ZESTRIL) 20 MG tablet Take 1 tablet (20 mg total) by mouth daily. 90 tablet 3  . lithium carbonate 300 MG capsule Take 1 capsule (300 mg total) by mouth daily. 90 capsule 1  . metFORMIN (GLUCOPHAGE) 500 MG tablet Take 1,000 mg by mouth 2 (two) times daily with a meal.    . nitroGLYCERIN (NITROSTAT)  0.4 MG SL tablet Place 1 tablet (0.4 mg total) under the tongue every 5 (five) minutes as needed for chest pain. 25 tablet 6  . pregabalin (LYRICA) 50 MG capsule Limit  one tablet by mouth per day or twice per day if tolerated 60 capsule 0  . PROAIR HFA 108 (90 BASE) MCG/ACT inhaler     . QUEtiapine (SEROQUEL) 300 MG tablet Take 1 tablet (300 mg total) by mouth at bedtime. Take 1.5 tablet at bed time. 135 tablet 1  . RESTASIS 0.05 % ophthalmic emulsion     . rOPINIRole (REQUIP) 2 MG tablet Take 2 mg by mouth at bedtime as needed.     Marland Kitchen Spacer/Aero-Holding Chambers (E-Z SPACER) inhaler Use as instructed.    . Vilazodone HCl (VIIBRYD) 10 MG TABS Take 1 tablet (10 mg total) by mouth daily. 90 tablet 1  . zolpidem (AMBIEN) 10 MG tablet Take 1 tablet (10 mg total) by mouth at bedtime. 8 tablet 0  . omeprazole (PRILOSEC) 20 MG capsule Take 20 mg by mouth daily.      Current Facility-Administered Medications  Medication Dose Route Frequency Provider Last Rate Last Dose  . bupivacaine (PF) (MARCAINE) 0.25 % injection 30 mL  30 mL Other Once Mohammed Kindle, MD      . ceFAZolin (ANCEF) IVPB 1 g/50 mL premix  1 g Intravenous Once Mohammed Kindle, MD      . ceFAZolin (ANCEF) IVPB 1 g/50 mL premix  1 g Intravenous Once Mohammed Kindle, MD      . fentaNYL (SUBLIMAZE) injection 100 mcg  100 mcg Intravenous Once Mohammed Kindle, MD      . lactated ringers infusion 1,000 mL  1,000 mL Intravenous Continuous Mohammed Kindle, MD      . lactated ringers infusion 1,000 mL  1,000 mL Intravenous Continuous Mohammed Kindle, MD      . lidocaine (PF) (XYLOCAINE) 1 %  injection 10 mL  10 mL Subcutaneous Once Mohammed Kindle, MD      . lidocaine (PF) (XYLOCAINE) 1 % injection 10 mL  10 mL Subcutaneous Once Mohammed Kindle, MD      . midazolam (VERSED) 5 MG/5ML injection 5 mg  5 mg Intravenous Once Mohammed Kindle, MD      . midazolam (VERSED) injection 5 mg  5 mg Intravenous Once Mohammed Kindle, MD      . orphenadrine (NORFLEX) injection 60  mg  60 mg Intramuscular Once Mohammed Kindle, MD      . orphenadrine (NORFLEX) injection 60 mg  60 mg Intramuscular Once Mohammed Kindle, MD      . sodium chloride 0.9 % injection 20 mL  20 mL Other Once Mohammed Kindle, MD      . triamcinolone acetonide (KENALOG-40) injection 40 mg  40 mg Other Once Mohammed Kindle, MD      . triamcinolone acetonide (KENALOG-40) injection 40 mg  40 mg Other Once Mohammed Kindle, MD        Medical Decision Making:  Review of Psycho-Social Stressors (1) and Review of Last Therapy Session (1)  Treatment Plan Summary:Medication management   Anxiety Patient will continue on Viibryd 56m po qam and Xanax 0.5 mg po qhs prn as prescribed  Mood symptoms She will continue on Seroquel 300 mg 1-1/2 pill at bedtime. She was given 90 day supply of the medication Patient is also on lithium carbonate 300 mg daily at bedtime   Patient was given the lab requisition form and she will get her lithium level, CBC with differential, CMP, TSH, lipid panel as well as a prolactin level done as her primary care physician office  Insomnia Patient takes Ambien 5 mg at bedtime and discussed with her about the adverse effects of the medication in detail and she demonstrated understanding  Follow-up She will follow up in 3 months or earlier depending on her symptoms     More than 50% of the time spent in psychoeducation, counseling and coordination of care.  Time spent with the patient 25 minutes   This note was generated in part or whole with voice recognition software. Voice regonition is usually quite accurate but there are transcription errors that can and very often do occur. I apologize for any typographical errors that were not detected and corrected.    URainey Pines MD  11/21/2015, 1:39 PM

## 2015-11-22 ENCOUNTER — Ambulatory Visit: Attending: Pain Medicine | Admitting: Pain Medicine

## 2015-11-22 ENCOUNTER — Encounter: Payer: Self-pay | Admitting: Pain Medicine

## 2015-11-22 VITALS — BP 127/66 | HR 94 | Temp 98.8°F | Resp 14 | Ht 64.0 in | Wt 161.0 lb

## 2015-11-22 DIAGNOSIS — M5416 Radiculopathy, lumbar region: Secondary | ICD-10-CM

## 2015-11-22 DIAGNOSIS — M47816 Spondylosis without myelopathy or radiculopathy, lumbar region: Secondary | ICD-10-CM

## 2015-11-22 DIAGNOSIS — M79606 Pain in leg, unspecified: Secondary | ICD-10-CM | POA: Diagnosis present

## 2015-11-22 DIAGNOSIS — M5030 Other cervical disc degeneration, unspecified cervical region: Secondary | ICD-10-CM

## 2015-11-22 DIAGNOSIS — M545 Low back pain: Secondary | ICD-10-CM | POA: Diagnosis present

## 2015-11-22 DIAGNOSIS — M961 Postlaminectomy syndrome, not elsewhere classified: Secondary | ICD-10-CM

## 2015-11-22 DIAGNOSIS — M797 Fibromyalgia: Secondary | ICD-10-CM

## 2015-11-22 DIAGNOSIS — M5136 Other intervertebral disc degeneration, lumbar region: Secondary | ICD-10-CM | POA: Diagnosis not present

## 2015-11-22 DIAGNOSIS — M5126 Other intervertebral disc displacement, lumbar region: Secondary | ICD-10-CM | POA: Diagnosis not present

## 2015-11-22 DIAGNOSIS — M48062 Spinal stenosis, lumbar region with neurogenic claudication: Secondary | ICD-10-CM

## 2015-11-22 DIAGNOSIS — M5481 Occipital neuralgia: Secondary | ICD-10-CM

## 2015-11-22 DIAGNOSIS — M5137 Other intervertebral disc degeneration, lumbosacral region: Secondary | ICD-10-CM

## 2015-11-22 MED ORDER — LIDOCAINE HCL (PF) 1 % IJ SOLN
10.0000 mL | Freq: Once | INTRAMUSCULAR | Status: AC
  Administered 2015-11-22: 12:00:00 via SUBCUTANEOUS

## 2015-11-22 MED ORDER — FENTANYL CITRATE (PF) 100 MCG/2ML IJ SOLN
INTRAMUSCULAR | Status: AC
  Administered 2015-11-22: 100 ug
  Filled 2015-11-22: qty 2

## 2015-11-22 MED ORDER — CEFAZOLIN SODIUM 1-5 GM-% IV SOLN: 1.0000 g | Freq: Once | INTRAVENOUS | Status: DC

## 2015-11-22 MED ORDER — TRIAMCINOLONE ACETONIDE 40 MG/ML IJ SUSP: 40.0000 mg | Freq: Once | INTRAMUSCULAR | Status: DC

## 2015-11-22 MED ORDER — FENTANYL CITRATE (PF) 100 MCG/2ML IJ SOLN: 100.0000 ug | Freq: Once | INTRAMUSCULAR | Status: DC

## 2015-11-22 MED ORDER — ORPHENADRINE CITRATE 30 MG/ML IJ SOLN
INTRAMUSCULAR | Status: AC
  Administered 2015-11-22: 12:00:00
  Filled 2015-11-22: qty 2

## 2015-11-22 MED ORDER — BUPIVACAINE HCL (PF) 0.25 % IJ SOLN
INTRAMUSCULAR | Status: AC
  Administered 2015-11-22: 12:00:00
  Filled 2015-11-22: qty 30

## 2015-11-22 MED ORDER — MIDAZOLAM HCL 5 MG/5ML IJ SOLN
INTRAMUSCULAR | Status: AC
  Administered 2015-11-22: 4 mg via INTRAVENOUS
  Filled 2015-11-22: qty 5

## 2015-11-22 MED ORDER — CEFUROXIME AXETIL 250 MG PO TABS: 250.0000 mg | ORAL_TABLET | Freq: Two times a day (BID) | ORAL | Status: DC

## 2015-11-22 MED ORDER — ORPHENADRINE CITRATE 30 MG/ML IJ SOLN: 60.0000 mg | Freq: Once | INTRAMUSCULAR | Status: DC

## 2015-11-22 MED ORDER — CEFAZOLIN SODIUM 1 G IJ SOLR
INTRAMUSCULAR | Status: AC
  Administered 2015-11-22: 12:00:00 via INTRAVENOUS
  Filled 2015-11-22: qty 10

## 2015-11-22 MED ORDER — MIDAZOLAM HCL 5 MG/5ML IJ SOLN: 5.0000 mg | Freq: Once | INTRAMUSCULAR | Status: DC

## 2015-11-22 MED ORDER — BUPIVACAINE HCL (PF) 0.25 % IJ SOLN: 30.0000 mL | Freq: Once | INTRAMUSCULAR | Status: DC

## 2015-11-22 MED ORDER — LIDOCAINE HCL (PF) 1 % IJ SOLN
INTRAMUSCULAR | Status: AC
  Filled 2015-11-22: qty 5

## 2015-11-22 MED ORDER — LACTATED RINGERS IV SOLN: 1000.0000 mL | INTRAVENOUS | Status: DC

## 2015-11-22 MED ORDER — TRIAMCINOLONE ACETONIDE 40 MG/ML IJ SUSP
INTRAMUSCULAR | Status: AC
  Administered 2015-11-22: 12:00:00
  Filled 2015-11-22: qty 1

## 2015-11-22 NOTE — Patient Instructions (Addendum)
PLAN   Continue present medication Lyrica and hydrocodone and begin taking antibiotics Ceftin today as prescribed  F/U PCP Dr.Olmedo  for evaliation of  BP and general medical  condition  F/U surgical evaluation. Neurosurgical evaluation as planned  F/U neurological evaluation. May consider pending follow-up evaluations  May consider radiofrequency rhizolysis or intraspinal procedures pending response to present treatment and F/U evaluation   Patient to call [facility] should patient have concerns prior to scheduled return appointmentGENERAL RISKS AND COMPLICATIONS  What are the risk, side effects and possible complications? Generally speaking, most procedures are safe.  However, with any procedure there are risks, side effects, and the possibility of complications.  The risks and complications are dependent upon the sites that are lesioned, or the type of nerve block to be performed.  The closer the procedure is to the spine, the more serious the risks are.  Great care is taken when placing the radio frequency needles, block needles or lesioning probes, but sometimes complications can occur. 1. Infection: Any time there is an injection through the skin, there is a risk of infection.  This is why sterile conditions are used for these blocks.  There are four possible types of infection. 1. Localized skin infection. 2. Central Nervous System Infection-This can be in the form of Meningitis, which can be deadly. 3. Epidural Infections-This can be in the form of an epidural abscess, which can cause pressure inside of the spine, causing compression of the spinal cord with subsequent paralysis. This would require an emergency surgery to decompress, and there are no guarantees that the patient would recover from the paralysis. 4. Discitis-This is an infection of the intervertebral discs.  It occurs in about 1% of discography procedures.  It is difficult to treat and it may lead to  surgery.        2. Pain: the needles have to go through skin and soft tissues, will cause soreness.       3. Damage to internal structures:  The nerves to be lesioned may be near blood vessels or    other nerves which can be potentially damaged.       4. Bleeding: Bleeding is more common if the patient is taking blood thinners such as  aspirin, Coumadin, Ticiid, Plavix, etc., or if he/she have some genetic predisposition  such as hemophilia. Bleeding into the spinal canal can cause compression of the spinal  cord with subsequent paralysis.  This would require an emergency surgery to  decompress and there are no guarantees that the patient would recover from the  paralysis.       5. Pneumothorax:  Puncturing of a lung is a possibility, every time a needle is introduced in  the area of the chest or upper back.  Pneumothorax refers to free air around the  collapsed lung(s), inside of the thoracic cavity (chest cavity).  Another two possible  complications related to a similar event would include: Hemothorax and Chylothorax.   These are variations of the Pneumothorax, where instead of air around the collapsed  lung(s), you may have blood or chyle, respectively.       6. Spinal headaches: They may occur with any procedures in the area of the spine.       7. Persistent CSF (Cerebro-Spinal Fluid) leakage: This is a rare problem, but may occur  with prolonged intrathecal or epidural catheters either due to the formation of a fistulous  track or a dural tear.  8. Nerve damage: By working so close to the spinal cord, there is always a possibility of  nerve damage, which could be as serious as a permanent spinal cord injury with  paralysis.       9. Death:  Although rare, severe deadly allergic reactions known as "Anaphylactic  reaction" can occur to any of the medications used.      10. Worsening of the symptoms:  We can always make thing worse.  What are the chances of something like this  happening? Chances of any of this occuring are extremely low.  By statistics, you have more of a chance of getting killed in a motor vehicle accident: while driving to the hospital than any of the above occurring .  Nevertheless, you should be aware that they are possibilities.  In general, it is similar to taking a shower.  Everybody knows that you can slip, hit your head and get killed.  Does that mean that you should not shower again?  Nevertheless always keep in mind that statistics do not mean anything if you happen to be on the wrong side of them.  Even if a procedure has a 1 (one) in a 1,000,000 (million) chance of going wrong, it you happen to be that one..Also, keep in mind that by statistics, you have more of a chance of having something go wrong when taking medications.  Who should not have this procedure? If you are on a blood thinning medication (e.g. Coumadin, Plavix, see list of "Blood Thinners"), or if you have an active infection going on, you should not have the procedure.  If you are taking any blood thinners, please inform your physician.  How should I prepare for this procedure?  Do not eat or drink anything at least six hours prior to the procedure.  Bring a driver with you .  It cannot be a taxi.  Come accompanied by an adult that can drive you back, and that is strong enough to help you if your legs get weak or numb from the local anesthetic.  Take all of your medicines the morning of the procedure with just enough water to swallow them.  If you have diabetes, make sure that you are scheduled to have your procedure done first thing in the morning, whenever possible.  If you have diabetes, take only half of your insulin dose and notify our nurse that you have done so as soon as you arrive at the clinic.  If you are diabetic, but only take blood sugar pills (oral hypoglycemic), then do not take them on the morning of your procedure.  You may take them after you have had the  procedure.  Do not take aspirin or any aspirin-containing medications, at least eleven (11) days prior to the procedure.  They may prolong bleeding.  Wear loose fitting clothing that may be easy to take off and that you would not mind if it got stained with Betadine or blood.  Do not wear any jewelry or perfume  Remove any nail coloring.  It will interfere with some of our monitoring equipment.  NOTE: Remember that this is not meant to be interpreted as a complete list of all possible complications.  Unforeseen problems may occur.  BLOOD THINNERS The following drugs contain aspirin or other products, which can cause increased bleeding during surgery and should not be taken for 2 weeks prior to and 1 week after surgery.  If you should need take something for relief of minor  pain, you may take acetaminophen which is found in Tylenol,m Datril, Anacin-3 and Panadol. It is not blood thinner. The products listed below are.  Do not take any of the products listed below in addition to any listed on your instruction sheet.  A.P.C or A.P.C with Codeine Codeine Phosphate Capsules #3 Ibuprofen Ridaura  ABC compound Congesprin Imuran rimadil  Advil Cope Indocin Robaxisal  Alka-Seltzer Effervescent Pain Reliever and Antacid Coricidin or Coricidin-D  Indomethacin Rufen  Alka-Seltzer plus Cold Medicine Cosprin Ketoprofen S-A-C Tablets  Anacin Analgesic Tablets or Capsules Coumadin Korlgesic Salflex  Anacin Extra Strength Analgesic tablets or capsules CP-2 Tablets Lanoril Salicylate  Anaprox Cuprimine Capsules Levenox Salocol  Anexsia-D Dalteparin Magan Salsalate  Anodynos Darvon compound Magnesium Salicylate Sine-off  Ansaid Dasin Capsules Magsal Sodium Salicylate  Anturane Depen Capsules Marnal Soma  APF Arthritis pain formula Dewitt's Pills Measurin Stanback  Argesic Dia-Gesic Meclofenamic Sulfinpyrazone  Arthritis Bayer Timed Release Aspirin Diclofenac Meclomen Sulindac  Arthritis pain formula  Anacin Dicumarol Medipren Supac  Analgesic (Safety coated) Arthralgen Diffunasal Mefanamic Suprofen  Arthritis Strength Bufferin Dihydrocodeine Mepro Compound Suprol  Arthropan liquid Dopirydamole Methcarbomol with Aspirin Synalgos  ASA tablets/Enseals Disalcid Micrainin Tagament  Ascriptin Doan's Midol Talwin  Ascriptin A/D Dolene Mobidin Tanderil  Ascriptin Extra Strength Dolobid Moblgesic Ticlid  Ascriptin with Codeine Doloprin or Doloprin with Codeine Momentum Tolectin  Asperbuf Duoprin Mono-gesic Trendar  Aspergum Duradyne Motrin or Motrin IB Triminicin  Aspirin plain, buffered or enteric coated Durasal Myochrisine Trigesic  Aspirin Suppositories Easprin Nalfon Trillsate  Aspirin with Codeine Ecotrin Regular or Extra Strength Naprosyn Uracel  Atromid-S Efficin Naproxen Ursinus  Auranofin Capsules Elmiron Neocylate Vanquish  Axotal Emagrin Norgesic Verin  Azathioprine Empirin or Empirin with Codeine Normiflo Vitamin E  Azolid Emprazil Nuprin Voltaren  Bayer Aspirin plain, buffered or children's or timed BC Tablets or powders Encaprin Orgaran Warfarin Sodium  Buff-a-Comp Enoxaparin Orudis Zorpin  Buff-a-Comp with Codeine Equegesic Os-Cal-Gesic   Buffaprin Excedrin plain, buffered or Extra Strength Oxalid   Bufferin Arthritis Strength Feldene Oxphenbutazone   Bufferin plain or Extra Strength Feldene Capsules Oxycodone with Aspirin   Bufferin with Codeine Fenoprofen Fenoprofen Pabalate or Pabalate-SF   Buffets II Flogesic Panagesic   Buffinol plain or Extra Strength Florinal or Florinal with Codeine Panwarfarin   Buf-Tabs Flurbiprofen Penicillamine   Butalbital Compound Four-way cold tablets Penicillin   Butazolidin Fragmin Pepto-Bismol   Carbenicillin Geminisyn Percodan   Carna Arthritis Reliever Geopen Persantine   Carprofen Gold's salt Persistin   Chloramphenicol Goody's Phenylbutazone   Chloromycetin Haltrain Piroxlcam   Clmetidine heparin Plaquenil   Cllnoril Hyco-pap  Ponstel   Clofibrate Hydroxy chloroquine Propoxyphen         Before stopping any of these medications, be sure to consult the physician who ordered them.  Some, such as Coumadin (Warfarin) are ordered to prevent or treat serious conditions such as "deep thrombosis", "pumonary embolisms", and other heart problems.  The amount of time that you may need off of the medication may also vary with the medication and the reason for which you were taking it.  If you are taking any of these medications, please make sure you notify your pain physician before you undergo any procedures.

## 2015-11-22 NOTE — Progress Notes (Signed)
Subjective:    Patient ID: Jennifer [name], female    DOB: 09/13/1956, 60 y.o.   MRN: [id number]  HPI PROCEDURE PERFORMED: Lumbosacral selective nerve root block   NOTE: The patient is a 60 y.o. female who returns to [facility] for further evaluation and treatment of pain involving the lumbar and lower extremity region. Studies consisting of MRI has revealed the patient to be with evidence of Degenerative disc disease lumbar spine  MRI revealed degenerative changes with prior surgical intervention lumbar region, L5-S1 posterior lumbar interbody fusion, 10 x 12 mm right facet intraspinal synovial cyst, mass effect upon the right lateral thecal sac and mild medial displacement of the S1 nerve root as it abutting the foraminal portion of the L5 nerve root. L4-L5 broad-based disc bulge with moderate bilateral facet arthropathy and mild right foraminal stenosis there is concern regarding intraspinal abnormalities contributing to patient's symptomatology with concern regarding radiculopathy. The risks, benefits, and expectations of the procedure have been explained to the patient who was understanding and in agreement with suggested treatment plan. We will proceed with interventional treatment as discussed and as explained to the patient. The patient is understanding and in agreement with suggested treatment plan.   DESCRIPTION OF PROCEDURE: Lumbosacral selective nerve root block with IV Versed, IV fentanyl conscious sedation, EKG, blood pressure, pulse, and pulse oximetry monitoring. The procedure was performed with the patient in the prone position under fluoroscopic guidance. With the patient in the prone position, Betadine prep of proposed entry site was performed. Local anesthetic skin wheal of proposed needle entry site was prepared with 1.5% plain lidocaine with AP view of the lumbosacral spine.   PROCEDURE #1: Needle placement at the right L 2 vertebral body: A 22 -gauge needle was  inserted at the inferior border of the transverse process of the vertebral body with needle placed medial to the midline of the transverse process on AP view of the lumbosacral spine.   NEEDLE PLACEMENT AT  L3 and L4  VERTEBRAL BODY LEVELS  Needle  placement was accomplished at L3 and L4  vertebral body levels on the right side exactly as was accomplished at the L2  vertebral body level  and utilizing the same technique and under fluoroscopic guidance.  PROCEDURE #4: Needle placement at the S1 foramen. With the patient in the prone position with Betadine prep of proposed entry site accomplished, the S1 foramen was visualized under fluoroscopic guidance with AP view of the lumbosacral spine with cephalad orientation of the fluoroscope with local anesthetic skin wheal of 1.5% lidocaine of proposed needle entry site prepared. A 22-gauge needle was inserted S1 foramen under fluoroscopic guidance eliciting paresthesias radiating from the buttocks to the lower extremity after which needle was slightly withdrawn.   Needle placement was then verified on lateral view at all levels with needle tip documented to be in the posterior superior quadrant of the intervertebral foramen of  L 2, L3, L4, and needle tip documented at the level of the S1 foramen. Following negative aspiration for heme and CSF at each level, each level was injected with 3 mL of 0.25% bupivacaine with Kenalog.     The patient tolerated the procedure well. A total of 10 mg of Kenalog was utilized for the procedure.   PLAN:  1. Medications: Will continue presently prescribed medications. Lyrica and hydrocodone acetaminophen 2. The patient is to undergo follow-up evaluation with PCP Dr.Olmedo  for evaluation of blood pressure and general medical condition status post procedure  performed on today's visit. 3. Surgical follow-up evaluation.. Patient will follow up surgical evaluation as discussed  4. Neurological evaluation.Has been addressed   5. May consider radiofrequency procedures, implantation type procedures and other treatment pending response to treatment and follow-up evaluation. 6. The patient has been advise do adhere to proper body mechanics and avoid activities which may aggravate condition. 7. The patient has been advised to call the [facility] prior to scheduled return appointment should there be significant change in the patient's condition or should the patient have other concerns regarding condition prior to scheduled return appointment.    Review of Systems     Objective:   Physical Exam        Assessment & Plan:

## 2015-11-23 ENCOUNTER — Telehealth: Payer: Self-pay

## 2015-11-23 NOTE — Telephone Encounter (Signed)
Post procedure call.  Son said she was laying down and would call us when she woke up.

## 2015-11-24 ENCOUNTER — Other Ambulatory Visit: Payer: Self-pay

## 2015-12-11 ENCOUNTER — Telehealth: Payer: Self-pay | Admitting: Pain Medicine

## 2015-12-11 NOTE — Telephone Encounter (Signed)
Will be out of meds on 12-15-15 can she be added to sched for med refill

## 2015-12-11 NOTE — Telephone Encounter (Signed)
Dr. Primus Bravo  Patient states she cannot come in for next eval visit due to having to go out of town for her daughter next week. Wants to know if she can come in sometime this week instead. Please advise. Thank you----Jennifer

## 2015-12-11 NOTE — Telephone Encounter (Signed)
shatoya     Please call patient and schedule appointment and confirm for Thursday 12-14-2015 at 0730am- thank you-Celenia Hruska

## 2015-12-11 NOTE — Telephone Encounter (Signed)
Nurses and Secretaries Please schedule patient for 7:30 AM appointment for Thursday, 12/14/2015

## 2015-12-14 ENCOUNTER — Ambulatory Visit: Attending: Pain Medicine | Admitting: Pain Medicine

## 2015-12-14 ENCOUNTER — Encounter: Payer: Self-pay | Admitting: Pain Medicine

## 2015-12-14 VITALS — BP 128/86 | HR 89 | Temp 98.5°F | Resp 16 | Ht 63.0 in | Wt 163.0 lb

## 2015-12-14 DIAGNOSIS — M5137 Other intervertebral disc degeneration, lumbosacral region: Secondary | ICD-10-CM

## 2015-12-14 DIAGNOSIS — M47896 Other spondylosis, lumbar region: Secondary | ICD-10-CM | POA: Diagnosis not present

## 2015-12-14 DIAGNOSIS — M79604 Pain in right leg: Secondary | ICD-10-CM | POA: Diagnosis present

## 2015-12-14 DIAGNOSIS — Z9889 Other specified postprocedural states: Secondary | ICD-10-CM | POA: Diagnosis not present

## 2015-12-14 DIAGNOSIS — M797 Fibromyalgia: Secondary | ICD-10-CM

## 2015-12-14 DIAGNOSIS — G629 Polyneuropathy, unspecified: Secondary | ICD-10-CM | POA: Diagnosis not present

## 2015-12-14 DIAGNOSIS — M4806 Spinal stenosis, lumbar region: Secondary | ICD-10-CM | POA: Diagnosis not present

## 2015-12-14 DIAGNOSIS — M5030 Other cervical disc degeneration, unspecified cervical region: Secondary | ICD-10-CM

## 2015-12-14 DIAGNOSIS — M5116 Intervertebral disc disorders with radiculopathy, lumbar region: Secondary | ICD-10-CM | POA: Insufficient documentation

## 2015-12-14 DIAGNOSIS — M48062 Spinal stenosis, lumbar region with neurogenic claudication: Secondary | ICD-10-CM

## 2015-12-14 DIAGNOSIS — M47816 Spondylosis without myelopathy or radiculopathy, lumbar region: Secondary | ICD-10-CM

## 2015-12-14 DIAGNOSIS — M5481 Occipital neuralgia: Secondary | ICD-10-CM

## 2015-12-14 DIAGNOSIS — M5126 Other intervertebral disc displacement, lumbar region: Secondary | ICD-10-CM | POA: Diagnosis not present

## 2015-12-14 DIAGNOSIS — M5416 Radiculopathy, lumbar region: Secondary | ICD-10-CM

## 2015-12-14 DIAGNOSIS — M961 Postlaminectomy syndrome, not elsewhere classified: Secondary | ICD-10-CM

## 2015-12-14 DIAGNOSIS — M545 Low back pain: Secondary | ICD-10-CM | POA: Diagnosis present

## 2015-12-14 MED ORDER — PREGABALIN 50 MG PO CAPS: ORAL_CAPSULE | ORAL | Status: DC

## 2015-12-14 MED ORDER — CARISOPRODOL 350 MG PO TABS: ORAL_TABLET | ORAL | Status: DC

## 2015-12-14 MED ORDER — HYDROCODONE-ACETAMINOPHEN 10-325 MG PO TABS: ORAL_TABLET | ORAL | Status: DC

## 2015-12-14 NOTE — Patient Instructions (Addendum)
PLAN   Continue present medication Lyrica Soma and hydrocodone acetaminophen  Lumbosacral selective nerve root block to be performed at time of return appointment  F/U PCP Dr.Olmedo  for evaliation of  BP and general medical  condition  F/U surgical evaluation. Neurosurgical evaluation as planned Please ask Shatoya  Date of neurosurgical evaluation  TENS unit  Please ask receptionists to assist you with obtaining TENS unit which has been ordered  F/U neurological evaluation. May consider pending follow-up evaluations with Dr. Melrose Nakayama as discussed  May consider radiofrequency rhizolysis or intraspinal procedures pending response to present treatment and F/U evaluation   Patient to call [facility] should patient have concerns prior to scheduled return appointment.

## 2015-12-14 NOTE — Progress Notes (Signed)
Subjective:    Patient ID: Jennifer [name], female    DOB: [birth year], 60 y.o.   MRN: [id number]  HPI  The patient is a 60 year old female who returns to pain management for further evaluation and treatment of pain involving the lumbar lower extremity region especially the right lower extremity region. Patient has undergone prior surgical evaluation and we'll also schedule patient for surgical reevaluation of her lumbar and lower extremity pain. The patient continues Lyrica Soma and hydrocodone acetaminophen and has had significant improvement with prior lumbosacral selective nerve root blocks. We discussed additional treatment and patient stated that she was to proceed with lumbosacral selective nerve block at time return appointment since the procedure was so effective. The patient states the pain is aggravated by standing walking becoming more intense with pain radiating from the lumbar region toward the lower extremity on the right the left. The patient denies any recent trauma change in events of daily living the call significant change in symptomatology. We discussed patient's condition and will proceed with lumbosacral selective nerve root block at time return appointment and will continue hydrocodone acetaminophen there is an Manufacturing engineer as prescribed. The patient is understanding and agrees with suggested treatment plan.  Review of Systems     Objective:   Physical Exam  Palpation over the cervical para spinal musculature region cervical facet region was with mild  discomfort. Palpation over the region of the cervical facet cervical paraspinal musculature region was attends to palpation on the left as well as on the right. Palpation of the splenius capitis and occipitalis musculature region was with mild to moderate discomfort. Palpation of the acromioclavicular and glenohumeral joint regions reproduced pain of mild degree and patient appeared to be with bilaterally equal grip strength with Tinel  and Phalen's maneuver reproducing minimal discomfort. The patient appeared to be with unremarkable Spurling's maneuver. Palpation of the thoracic region was with no crepitus of the thoracic region with muscle spasms noted in the lower thoracic paraspinal musculature region right greater than the left. Palpation over the lumbar paraspinal musculatures and lumbar facet region was attends to palpation on the left as well as on the right with lateral bending rotation extension and palpation over the lumbar facets reproducing moderate to moderately severe discomfort. Straight leg raising was decreased and tolerates approximately 20 with questionably increased pain with dorsiflexion noted EHL strength appeared to be decreased as well. No definite sensory deficit of dermatomal distribution detected. Negative clonus negative Homans. DTRs difficult difficult to elicit and appeared to be trace at the knees. There was tenderness of the PSIS and PII S region a mild to moderate degree and mild to moderate tenderness of the greater trochanteric region and iliotibial band region. Abdomen was nontender with no costovertebral tenderness noted.      Assessment & Plan:    Neuropathy of the lower extremities  Degenerative disc disease lumbar spine  MRI revealed degenerative changes with prior surgical intervention lumbar region, L5-S1 posterior lumbar interbody fusion, 10 x 12 mm right facet intraspinal synovial cyst, mass effect upon the right lateral thecal sac and mild medial displacement of the S1 nerve root as it abutting the foraminal portion of the L5 nerve root. L4-L5 broad-based disc bulge with moderate bilateral facet arthropathy and mild right foraminal stenosis   Lumbar stenosis with neurogenic claudication  Lumbar radiculopathy  Lumbar facet syndrome   Lumbar stenosis with neurogenic claudication      PLAN   Continue present medication Lyrica Soma and hydrocodone acetaminophen  Lumbosacral  selective nerve root block to be performed at time of return appointment  F/U PCP Dr.Olmedo  for evaliation of  BP and general medical  condition  F/U surgical evaluation. Neurosurgical evaluation as planned Please ask Shatoya  Date of neurosurgical evaluation  TENS unit  Please ask receptionists to assist you with obtaining TENS unit which has been ordered  F/U neurological evaluation. May consider pending follow-up evaluations with Dr. Melrose Nakayama as discussed  May consider radiofrequency rhizolysis or intraspinal procedures pending response to present treatment and F/U evaluation   Patient to call [facility] Center should patient have concerns prior to scheduled return appointment.

## 2015-12-14 NOTE — Progress Notes (Signed)
Safety precautions to be maintained throughout the outpatient stay will include: orient to surroundings, keep bed in low position, maintain call bell within reach at all times, provide assistance with transfer out of bed and ambulation.  

## 2015-12-19 ENCOUNTER — Ambulatory Visit: Admitting: Pain Medicine

## 2015-12-27 ENCOUNTER — Telehealth: Payer: Self-pay | Admitting: *Deleted

## 2015-12-27 NOTE — Telephone Encounter (Signed)
pt called stating that her daughter is having a patient daughter is having a procedure and needs her help with the grand children.therefore she needs to r/s.Marland KitchenTD

## 2015-12-27 NOTE — Telephone Encounter (Signed)
Tendra Please schedule Patient for 01/10/16

## 2016-01-10 ENCOUNTER — Ambulatory Visit: Admitting: Pain Medicine

## 2016-01-12 ENCOUNTER — Other Ambulatory Visit: Payer: Self-pay | Admitting: Pain Medicine

## 2016-01-15 ENCOUNTER — Ambulatory Visit: Attending: Pain Medicine | Admitting: Pain Medicine

## 2016-01-15 ENCOUNTER — Encounter: Payer: Self-pay | Admitting: Pain Medicine

## 2016-01-15 VITALS — BP 131/80 | HR 92 | Temp 97.4°F | Resp 14 | Ht 63.0 in | Wt 163.0 lb

## 2016-01-15 DIAGNOSIS — M961 Postlaminectomy syndrome, not elsewhere classified: Secondary | ICD-10-CM

## 2016-01-15 DIAGNOSIS — M5137 Other intervertebral disc degeneration, lumbosacral region: Secondary | ICD-10-CM

## 2016-01-15 DIAGNOSIS — Z981 Arthrodesis status: Secondary | ICD-10-CM | POA: Insufficient documentation

## 2016-01-15 DIAGNOSIS — M5136 Other intervertebral disc degeneration, lumbar region: Secondary | ICD-10-CM | POA: Insufficient documentation

## 2016-01-15 DIAGNOSIS — M545 Low back pain: Secondary | ICD-10-CM | POA: Diagnosis present

## 2016-01-15 DIAGNOSIS — M5030 Other cervical disc degeneration, unspecified cervical region: Secondary | ICD-10-CM

## 2016-01-15 DIAGNOSIS — M79606 Pain in leg, unspecified: Secondary | ICD-10-CM | POA: Diagnosis present

## 2016-01-15 DIAGNOSIS — M47816 Spondylosis without myelopathy or radiculopathy, lumbar region: Secondary | ICD-10-CM | POA: Diagnosis not present

## 2016-01-15 DIAGNOSIS — M797 Fibromyalgia: Secondary | ICD-10-CM

## 2016-01-15 DIAGNOSIS — M5416 Radiculopathy, lumbar region: Secondary | ICD-10-CM

## 2016-01-15 DIAGNOSIS — M48062 Spinal stenosis, lumbar region with neurogenic claudication: Secondary | ICD-10-CM

## 2016-01-15 DIAGNOSIS — M5481 Occipital neuralgia: Secondary | ICD-10-CM

## 2016-01-15 MED ORDER — MIDAZOLAM HCL 5 MG/5ML IJ SOLN
INTRAMUSCULAR | Status: AC
  Administered 2016-01-15: 3 mg via INTRAVENOUS
  Filled 2016-01-15: qty 5

## 2016-01-15 MED ORDER — SODIUM CHLORIDE 0.9% FLUSH: 20.0000 mL | Freq: Once | INTRAVENOUS | Status: DC

## 2016-01-15 MED ORDER — LACTATED RINGERS IV SOLN: 1000.0000 mL | INTRAVENOUS | Status: DC

## 2016-01-15 MED ORDER — ORPHENADRINE CITRATE 30 MG/ML IJ SOLN
INTRAMUSCULAR | Status: AC
  Administered 2016-01-15: 10:00:00
  Filled 2016-01-15: qty 2

## 2016-01-15 MED ORDER — BUPIVACAINE HCL (PF) 0.25 % IJ SOLN: 30.0000 mL | Freq: Once | INTRAMUSCULAR | Status: DC

## 2016-01-15 MED ORDER — MIDAZOLAM HCL 5 MG/5ML IJ SOLN: 5.0000 mg | Freq: Once | INTRAMUSCULAR | Status: DC

## 2016-01-15 MED ORDER — CEFAZOLIN SODIUM 1 G IJ SOLR
INTRAMUSCULAR | Status: AC
  Administered 2016-01-15: 10:00:00 via INTRAVENOUS
  Filled 2016-01-15: qty 10

## 2016-01-15 MED ORDER — CEFAZOLIN SODIUM 1-5 GM-% IV SOLN: 1.0000 g | Freq: Once | INTRAVENOUS | Status: DC

## 2016-01-15 MED ORDER — FENTANYL CITRATE (PF) 100 MCG/2ML IJ SOLN
INTRAMUSCULAR | Status: AC
  Administered 2016-01-15: 100 ug
  Filled 2016-01-15: qty 2

## 2016-01-15 MED ORDER — TRIAMCINOLONE ACETONIDE 40 MG/ML IJ SUSP: 40.0000 mg | Freq: Once | INTRAMUSCULAR | Status: DC

## 2016-01-15 MED ORDER — PREGABALIN 50 MG PO CAPS: ORAL_CAPSULE | ORAL | Status: DC

## 2016-01-15 MED ORDER — LIDOCAINE HCL (PF) 1 % IJ SOLN: 10.0000 mL | Freq: Once | INTRAMUSCULAR | Status: DC

## 2016-01-15 MED ORDER — HYDROCODONE-ACETAMINOPHEN 10-325 MG PO TABS: ORAL_TABLET | ORAL | Status: DC

## 2016-01-15 MED ORDER — FENTANYL CITRATE (PF) 100 MCG/2ML IJ SOLN: 100.0000 ug | Freq: Once | INTRAMUSCULAR | Status: DC

## 2016-01-15 MED ORDER — BUPIVACAINE HCL (PF) 0.25 % IJ SOLN
INTRAMUSCULAR | Status: AC
  Administered 2016-01-15: 10:00:00
  Filled 2016-01-15: qty 30

## 2016-01-15 MED ORDER — CEFUROXIME AXETIL 250 MG PO TABS: 250.0000 mg | ORAL_TABLET | Freq: Two times a day (BID) | ORAL | Status: DC

## 2016-01-15 MED ORDER — TRIAMCINOLONE ACETONIDE 40 MG/ML IJ SUSP
INTRAMUSCULAR | Status: AC
  Administered 2016-01-15: 10:00:00
  Filled 2016-01-15: qty 1

## 2016-01-15 NOTE — Progress Notes (Signed)
Patient here for procedure d/t lower back pain.  Patient also request medication refill. Safety precautions to be maintained throughout the outpatient stay will include: orient to surroundings, keep bed in low position, maintain call bell within reach at all times, provide assistance with transfer out of bed and ambulation.

## 2016-01-15 NOTE — Progress Notes (Signed)
Subjective:    Patient ID: Jennifer [name], female    DOB: [birth year], 60 y.o.   MRN: [id number]  HPI  PROCEDURE PERFORMED: Lumbosacral selective nerve root block   NOTE: The patient is a 60 y.o. female who returns to [facility] for further evaluation and treatment of pain involving the lumbar and lower extremity region. Studies consisting of MRI has revealed the patient to be with evidence of degenerative disc disease lumbar spine  MRI revealed degenerative changes with prior surgical intervention lumbar region, L5-S1 posterior lumbar interbody fusion, 10 x 12 mm right facet intraspinal synovial cyst, mass effect upon the right lateral thecal sac and mild medial displacement of the S1 nerve root as it abutting the foraminal portion of the L5 nerve root. L4-L5 broad-based disc bulge with moderate bilateral facet arthropathy and mild right foraminal stenosis . There is concern regarding intraspinal abnormalities contributing to the patient's symptomatology with concern regarding component of patient's pain being due to lumbar radiculopathy . The risks, benefits, and expectations of the procedure have been explained to the patient who was understanding and in agreement with suggested treatment plan. We will proceed with interventional treatment as discussed and as explained to the patient. The patient is understanding and in agreement with suggested treatment plan.   DESCRIPTION OF PROCEDURE: Lumbosacral selective nerve root block with IV Versed, IV fentanyl conscious sedation, EKG, blood pressure, pulse, and pulse oximetry monitoring. The procedure was performed with the patient in the prone position under fluoroscopic guidance. With the patient in the prone position, Betadine prep of proposed entry site was performed. Local anesthetic skin wheal of proposed needle entry site was prepared with 1.5% plain lidocaine with AP view of the lumbosacral spine.   PROCEDURE #1: Needle placement at  the right  Lto  vertebral body: A 22 -gauge needle was inserted at the inferior border of the transverse process of the vertebral body with needle placed medial to the midline of the transverse process on AP view of the lumbosacral spine.   NEEDLE PLACEMENT AT  L3 and L4  VERTEBRAL BODY LEVELS  Needle  placement was accomplished at L3 and L4  vertebral body levels on the right  side exactly as was accomplished at the L2  vertebral body level  and utilizing the same technique and under fluoroscopic guidance.  PROCEDURE #4: Needle placement at the S1 foramen. With the patient in the prone position with Betadine prep of proposed entry site accomplished, the S1 foramen was visualized under fluoroscopic guidance with AP view of the lumbosacral spine with cephalad orientation of the fluoroscope with local anesthetic skin wheal of 1.5% lidocaine of proposed needle entry site prepared. A 22-gauge needle was inserted S1 foramen under fluoroscopic guidance eliciting paresthesias radiating from the buttocks to the lower extremity after which needle was slightly withdrawn.   Needle placement was then verified on lateral view at all levels with needle tip documented to be in the posterior superior quadrant of the intervertebral foramen of  L 2, L3, L4, and needle tip documented at the level of the S1 foramen. Following negative aspiration for heme and CSF at each level, each level was injected with 3 mL of 0.25% bupivacaine with Kenalog.   The patient tolerated the procedure well.   A total of 10 mg of Kenalog was utilized for the procedure.   PLAN:  1. Medications: Will continue presently prescribed medications.Lyrica and hydrocodone acetaminophen  2. The patient is to undergo follow-up evaluation with PCP  Dr. Kym Groom  for evaluation of blood pressure and general medical condition status post procedure performed on today's visit. 3. Surgical follow-up evaluationHas been addressed 4. Neurological evaluation.  May consider [facility] EMG studies  5. May consider radiofrequency procedures, implantation type procedures and other treatment pending response to treatment and follow-up evaluation. 6. The patient has been advise do adhere to proper body mechanics and avoid activities which may aggravate condition. 7. The patient has been advised to call the [facility] Management Center prior to scheduled return appointment should there be significant change in the patient's condition or should the patient have other concerns regarding condition prior to scheduled return appointment.   Review of Systems     Objective:   Physical Exam        Assessment & Plan:

## 2016-01-15 NOTE — Patient Instructions (Addendum)
PLAN   Continue present medication Lyrica and hydrocodone and begin taking antibiotics Ceftin today as prescribed  F/U PCP Dr.Olmedo  for evaliation of  BP and general medical  condition  F/U surgical evaluation. Neurosurgical evaluation as planned  F/U neurological evaluation. May consider pending follow-up evaluations  May consider radiofrequency rhizolysis or intraspinal procedures pending response to present treatment and F/U evaluation   Patient to call [facility] should patient have concerns prior to scheduled return appointmentGENERAL RISKS AND COMPLICATIONS  What are the risk, side effects and possible complications? Generally speaking, most procedures are safe.  However, with any procedure there are risks, side effects, and the possibility of complications.  The risks and complications are dependent upon the sites that are lesioned, or the type of nerve block to be performed.  The closer the procedure is to the spine, the more serious the risks are.  Great care is taken when placing the radio frequency needles, block needles or lesioning probes, but sometimes complications can occur. 1. Infection: Any time there is an injection through the skin, there is a risk of infection.  This is why sterile conditions are used for these blocks.  There are four possible types of infection. 1. Localized skin infection. 2. Central Nervous System Infection-This can be in the form of Meningitis, which can be deadly. 3. Epidural Infections-This can be in the form of an epidural abscess, which can cause pressure inside of the spine, causing compression of the spinal cord with subsequent paralysis. This would require an emergency surgery to decompress, and there are no guarantees that the patient would recover from the paralysis. 4. Discitis-This is an infection of the intervertebral discs.  It occurs in about 1% of discography procedures.  It is difficult to treat and it may lead to  surgery.        2. Pain: the needles have to go through skin and soft tissues, will cause soreness.       3. Damage to internal structures:  The nerves to be lesioned may be near blood vessels or    other nerves which can be potentially damaged.       4. Bleeding: Bleeding is more common if the patient is taking blood thinners such as  aspirin, Coumadin, Ticiid, Plavix, etc., or if he/she have some genetic predisposition  such as hemophilia. Bleeding into the spinal canal can cause compression of the spinal  cord with subsequent paralysis.  This would require an emergency surgery to  decompress and there are no guarantees that the patient would recover from the  paralysis.       5. Pneumothorax:  Puncturing of a lung is a possibility, every time a needle is introduced in  the area of the chest or upper back.  Pneumothorax refers to free air around the  collapsed lung(s), inside of the thoracic cavity (chest cavity).  Another two possible  complications related to a similar event would include: Hemothorax and Chylothorax.   These are variations of the Pneumothorax, where instead of air around the collapsed  lung(s), you may have blood or chyle, respectively.       6. Spinal headaches: They may occur with any procedures in the area of the spine.       7. Persistent CSF (Cerebro-Spinal Fluid) leakage: This is a rare problem, but may occur  with prolonged intrathecal or epidural catheters either due to the formation of a fistulous  track or a dural tear.  8. Nerve damage: By working so close to the spinal cord, there is always a possibility of  nerve damage, which could be as serious as a permanent spinal cord injury with  paralysis.       9. Death:  Although rare, severe deadly allergic reactions known as "Anaphylactic  reaction" can occur to any of the medications used.      10. Worsening of the symptoms:  We can always make thing worse.  What are the chances of something like this  happening? Chances of any of this occuring are extremely low.  By statistics, you have more of a chance of getting killed in a motor vehicle accident: while driving to the hospital than any of the above occurring .  Nevertheless, you should be aware that they are possibilities.  In general, it is similar to taking a shower.  Everybody knows that you can slip, hit your head and get killed.  Does that mean that you should not shower again?  Nevertheless always keep in mind that statistics do not mean anything if you happen to be on the wrong side of them.  Even if a procedure has a 1 (one) in a 1,000,000 (million) chance of going wrong, it you happen to be that one..Also, keep in mind that by statistics, you have more of a chance of having something go wrong when taking medications.  Who should not have this procedure? If you are on a blood thinning medication (e.g. Coumadin, Plavix, see list of "Blood Thinners"), or if you have an active infection going on, you should not have the procedure.  If you are taking any blood thinners, please inform your physician.  How should I prepare for this procedure?  Do not eat or drink anything at least six hours prior to the procedure.  Bring a driver with you .  It cannot be a taxi.  Come accompanied by an adult that can drive you back, and that is strong enough to help you if your legs get weak or numb from the local anesthetic.  Take all of your medicines the morning of the procedure with just enough water to swallow them.  If you have diabetes, make sure that you are scheduled to have your procedure done first thing in the morning, whenever possible.  If you have diabetes, take only half of your insulin dose and notify our nurse that you have done so as soon as you arrive at the clinic.  If you are diabetic, but only take blood sugar pills (oral hypoglycemic), then do not take them on the morning of your procedure.  You may take them after you have had the  procedure.  Do not take aspirin or any aspirin-containing medications, at least eleven (11) days prior to the procedure.  They may prolong bleeding.  Wear loose fitting clothing that may be easy to take off and that you would not mind if it got stained with Betadine or blood.  Do not wear any jewelry or perfume  Remove any nail coloring.  It will interfere with some of our monitoring equipment.  NOTE: Remember that this is not meant to be interpreted as a complete list of all possible complications.  Unforeseen problems may occur.  BLOOD THINNERS The following drugs contain aspirin or other products, which can cause increased bleeding during surgery and should not be taken for 2 weeks prior to and 1 week after surgery.  If you should need take something for relief of minor  pain, you may take acetaminophen which is found in Tylenol,m Datril, Anacin-3 and Panadol. It is not blood thinner. The products listed below are.  Do not take any of the products listed below in addition to any listed on your instruction sheet.  A.P.C or A.P.C with Codeine Codeine Phosphate Capsules #3 Ibuprofen Ridaura  ABC compound Congesprin Imuran rimadil  Advil Cope Indocin Robaxisal  Alka-Seltzer Effervescent Pain Reliever and Antacid Coricidin or Coricidin-D  Indomethacin Rufen  Alka-Seltzer plus Cold Medicine Cosprin Ketoprofen S-A-C Tablets  Anacin Analgesic Tablets or Capsules Coumadin Korlgesic Salflex  Anacin Extra Strength Analgesic tablets or capsules CP-2 Tablets Lanoril Salicylate  Anaprox Cuprimine Capsules Levenox Salocol  Anexsia-D Dalteparin Magan Salsalate  Anodynos Darvon compound Magnesium Salicylate Sine-off  Ansaid Dasin Capsules Magsal Sodium Salicylate  Anturane Depen Capsules Marnal Soma  APF Arthritis pain formula Dewitt's Pills Measurin Stanback  Argesic Dia-Gesic Meclofenamic Sulfinpyrazone  Arthritis Bayer Timed Release Aspirin Diclofenac Meclomen Sulindac  Arthritis pain formula  Anacin Dicumarol Medipren Supac  Analgesic (Safety coated) Arthralgen Diffunasal Mefanamic Suprofen  Arthritis Strength Bufferin Dihydrocodeine Mepro Compound Suprol  Arthropan liquid Dopirydamole Methcarbomol with Aspirin Synalgos  ASA tablets/Enseals Disalcid Micrainin Tagament  Ascriptin Doan's Midol Talwin  Ascriptin A/D Dolene Mobidin Tanderil  Ascriptin Extra Strength Dolobid Moblgesic Ticlid  Ascriptin with Codeine Doloprin or Doloprin with Codeine Momentum Tolectin  Asperbuf Duoprin Mono-gesic Trendar  Aspergum Duradyne Motrin or Motrin IB Triminicin  Aspirin plain, buffered or enteric coated Durasal Myochrisine Trigesic  Aspirin Suppositories Easprin Nalfon Trillsate  Aspirin with Codeine Ecotrin Regular or Extra Strength Naprosyn Uracel  Atromid-S Efficin Naproxen Ursinus  Auranofin Capsules Elmiron Neocylate Vanquish  Axotal Emagrin Norgesic Verin  Azathioprine Empirin or Empirin with Codeine Normiflo Vitamin E  Azolid Emprazil Nuprin Voltaren  Bayer Aspirin plain, buffered or children's or timed BC Tablets or powders Encaprin Orgaran Warfarin Sodium  Buff-a-Comp Enoxaparin Orudis Zorpin  Buff-a-Comp with Codeine Equegesic Os-Cal-Gesic   Buffaprin Excedrin plain, buffered or Extra Strength Oxalid   Bufferin Arthritis Strength Feldene Oxphenbutazone   Bufferin plain or Extra Strength Feldene Capsules Oxycodone with Aspirin   Bufferin with Codeine Fenoprofen Fenoprofen Pabalate or Pabalate-SF   Buffets II Flogesic Panagesic   Buffinol plain or Extra Strength Florinal or Florinal with Codeine Panwarfarin   Buf-Tabs Flurbiprofen Penicillamine   Butalbital Compound Four-way cold tablets Penicillin   Butazolidin Fragmin Pepto-Bismol   Carbenicillin Geminisyn Percodan   Carna Arthritis Reliever Geopen Persantine   Carprofen Gold's salt Persistin   Chloramphenicol Goody's Phenylbutazone   Chloromycetin Haltrain Piroxlcam   Clmetidine heparin Plaquenil   Cllnoril Hyco-pap  Ponstel   Clofibrate Hydroxy chloroquine Propoxyphen         Before stopping any of these medications, be sure to consult the physician who ordered them.  Some, such as Coumadin (Warfarin) are ordered to prevent or treat serious conditions such as "deep thrombosis", "pumonary embolisms", and other heart problems.  The amount of time that you may need off of the medication may also vary with the medication and the reason for which you were taking it.  If you are taking any of these medications, please make sure you notify your pain physician before you undergo any procedures.

## 2016-01-16 ENCOUNTER — Encounter: Admitting: Pain Medicine

## 2016-01-16 ENCOUNTER — Telehealth: Payer: Self-pay | Admitting: *Deleted

## 2016-01-16 ENCOUNTER — Ambulatory Visit: Admitting: Psychiatry

## 2016-01-16 NOTE — Telephone Encounter (Signed)
No answer with post procedure phone call. 

## 2016-01-23 ENCOUNTER — Other Ambulatory Visit: Payer: Self-pay | Admitting: Pain Medicine

## 2016-01-23 ENCOUNTER — Encounter: Payer: Self-pay | Admitting: Cardiovascular Disease

## 2016-01-23 ENCOUNTER — Ambulatory Visit (INDEPENDENT_AMBULATORY_CARE_PROVIDER_SITE_OTHER): Admitting: Cardiovascular Disease

## 2016-01-23 VITALS — BP 150/98 | HR 84 | Ht 63.0 in | Wt 166.5 lb

## 2016-01-23 DIAGNOSIS — I2510 Atherosclerotic heart disease of native coronary artery without angina pectoris: Secondary | ICD-10-CM

## 2016-01-23 DIAGNOSIS — I10 Essential (primary) hypertension: Secondary | ICD-10-CM

## 2016-01-23 DIAGNOSIS — E785 Hyperlipidemia, unspecified: Secondary | ICD-10-CM

## 2016-01-23 DIAGNOSIS — R0602 Shortness of breath: Secondary | ICD-10-CM | POA: Diagnosis not present

## 2016-01-23 DIAGNOSIS — R079 Chest pain, unspecified: Secondary | ICD-10-CM | POA: Diagnosis not present

## 2016-01-23 MED ORDER — ATORVASTATIN CALCIUM 40 MG PO TABS: 40.0000 mg | ORAL_TABLET | Freq: Every day | ORAL | Status: DC

## 2016-01-23 MED ORDER — AMLODIPINE BESYLATE 5 MG PO TABS: 5.0000 mg | ORAL_TABLET | Freq: Two times a day (BID) | ORAL | Status: DC

## 2016-01-23 NOTE — Assessment & Plan Note (Signed)
We have stressed the importance of staying on Lipitor 40 mg daily New prescription sent to the pharmacy

## 2016-01-23 NOTE — Progress Notes (Signed)
Patient ID: Jennifer [name], female    DOB: [birth year], 60 y.o.   MRN: [id number]  HPI Comments: 60 year old woman with a history of coronary artery disease, PTCA of the LAD with 2.5 x 28 mm Cypher stent in January 2007, Repeat catheterization August 2011 with Stent placement of the mid LAD for 90% lesion,  catheterization August 06, 2011 for chest pain that showed severe mid LAD in-stent restenosis at the site of the previous drug-eluting stent ( DES stent x2 placed in the LAD in 2007 and 2011), also with severe mid RCA disease, moderate to severe proximal diagonal #1 disease, ejection fraction 55%.  hospitalization at [facility] on 12/09/2013 bypass surgery at [place]  on August 12 2011 by Dr. Roxan Hockey.  She had bypass graft x3 with a LIMA to the LAD, vein graft to the RCA, vein graft to the diagonal. She presents for routine follow-up of her coronary artery disease  Last catheterization June 03, 2013 Diabetes poorly controlled in the past,  In follow-up today, she reports blood pressure has been running high, same as on her visit today 150/98 She has been taking amlodipine 5 mg, carvedilol 12.5 mg, lisinopril 20 minute grams twice a day She's not been taking isosorbide Unclear if she has been taking Lipitor, total cholesterol has jumped from 140 up to 250 She does not remember why she is not taking it and is willing to retry Denies any significant chest pain on exertion, denies any shortness of breath She continues to see pain clinic, psychiatry  EKG on today's visit shows normal sinus rhythm with rate 86 bpm, no significant ST or T-wave changes  Other past medical history  problems with Chronic anxiety, stress at home, confusion at times. Treated by a psychiatrist in [place], started on Valium.  She is tired all time, sleeping a good number of hours per day. In the past she has taken benzodiazepines,  Prozac.  previously on Lamictal and Cymbalta,  She sees Dr. Eliberto Ivory, also sees Dr.  Leilani Merl in [place]. She does travel up to [place] to help family .    In follow-up today, she reports that she's not been taking some of her medications.   cardiac catheterization 06/03/2013. 100% occluded mid LAD at the site of a stent, mild mid RCA disease, saphenous vein graft to the diagonal and LIMA graft to the LAD are patent. Occluded saphenous vein graft to the distal RCA. Medical management was recommended.  July 08 2012  right-sided chest pain. She ruled out for myocardial infarction by cardiac enzymes.  nuclear stress test which showed no evidence of ischemia with an ejection fraction of 44%. She was noted to be hypertensive with systolic blood pressure of 180.     Allergies  Allergen Reactions  . No Known Allergies     Current Outpatient Prescriptions on File Prior to Visit  Medication Sig Dispense Refill  . albuterol-ipratropium (COMBIVENT) 18-103 MCG/ACT inhaler Inhale into the lungs as needed for wheezing or shortness of breath.    . ALPRAZolam (XANAX) 0.5 MG tablet Take 1 tablet (0.5 mg total) by mouth at bedtime as needed for anxiety. 30 tablet 2  . Blood Glucose Monitoring Suppl (FREESTYLE FREEDOM LITE) W/DEVICE KIT Use 3 (three) times daily. 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 Dx Code: 250.00    . carvedilol (COREG) 12.5 MG tablet Take 1 tablet (12.5 mg total) by mouth 2 (two) times daily with a meal. 180 tablet 3  . fluticasone (FLONASE) 50 MCG/ACT  nasal spray Place into the nose.    Marland Kitchen HYDROcodone-acetaminophen (NORCO) 10-325 MG tablet Limit 3-6 tabs by mouth per day if tolerated   ( NO OXYCODONE ) 180 tablet 0  . HYDROcodone-acetaminophen (NORCO) 10-325 MG tablet Limit 3-6 tabs by mouth per day if tolerated 180 tablet 0  . Insulin Isophane & Regular (HUMULIN 70/30 PEN [place]) Inject 27 Units into the skin 2 (two) times daily.     . isosorbide mononitrate (IMDUR) 60 MG 24 hr tablet Take 60 mg by mouth daily.     Marland Kitchen lisinopril (PRINIVIL,ZESTRIL) 20 MG tablet  Take 1 tablet (20 mg total) by mouth daily. 90 tablet 3  . lithium carbonate 300 MG capsule Take 1 capsule (300 mg total) by mouth daily. 90 capsule 2  . metFORMIN (GLUCOPHAGE) 500 MG tablet Take 1,000 mg by mouth 2 (two) times daily with a meal.    . nitroGLYCERIN (NITROSTAT) 0.4 MG SL tablet Place 1 tablet (0.4 mg total) under the tongue every 5 (five) minutes as needed for chest pain. 25 tablet 6  . omeprazole (PRILOSEC) 20 MG capsule Take 20 mg by mouth daily.     . pregabalin (LYRICA) 50 MG capsule Limit  one tablet by mouth per day or twice per day if tolerated 60 capsule 2  . QUEtiapine (SEROQUEL) 300 MG tablet Take 1 tablet (300 mg total) by mouth at bedtime. Take 1.5 tablet at bed time. 135 tablet 2  . RESTASIS 0.05 % ophthalmic emulsion     . Spacer/Aero-Holding Chambers (E-Z SPACER) inhaler Reported on 01/15/2016    . Vilazodone HCl (VIIBRYD) 40 MG TABS Take 1 tablet (40 mg total) by mouth daily. 90 tablet 2  . zolpidem (AMBIEN) 5 MG tablet Take 1 tablet (5 mg total) by mouth at bedtime. 30 tablet 2   Current Facility-Administered Medications on File Prior to Visit  Medication Dose Route Frequency Provider Last Rate Last Dose  . bupivacaine (PF) (MARCAINE) 0.25 % injection 30 mL  30 mL Other Once Mohammed Kindle, MD      . bupivacaine (PF) (MARCAINE) 0.25 % injection 30 mL  30 mL Other Once Mohammed Kindle, MD      . bupivacaine (PF) (MARCAINE) 0.25 % injection 30 mL  30 mL Other Once Mohammed Kindle, MD      . ceFAZolin (ANCEF) IVPB 1 g/50 mL premix  1 g Intravenous Once Mohammed Kindle, MD      . ceFAZolin (ANCEF) IVPB 1 g/50 mL premix  1 g Intravenous Once Mohammed Kindle, MD      . ceFAZolin (ANCEF) IVPB 1 g/50 mL premix  1 g Intravenous Once Mohammed Kindle, MD      . ceFAZolin (ANCEF) IVPB 1 g/50 mL premix  1 g Intravenous Once Mohammed Kindle, MD      . fentaNYL (SUBLIMAZE) injection 100 mcg  100 mcg Intravenous Once Mohammed Kindle, MD      . fentaNYL (SUBLIMAZE) injection 100 mcg  100 mcg  Intravenous Once Mohammed Kindle, MD      . fentaNYL (SUBLIMAZE) injection 100 mcg  100 mcg Intravenous Once Mohammed Kindle, MD      . lactated ringers infusion 1,000 mL  1,000 mL Intravenous Continuous Mohammed Kindle, MD      . lactated ringers infusion 1,000 mL  1,000 mL Intravenous Continuous Mohammed Kindle, MD      . lactated ringers infusion 1,000 mL  1,000 mL Intravenous Continuous Mohammed Kindle, MD      . lactated ringers  infusion 1,000 mL  1,000 mL Intravenous Continuous Mohammed Kindle, MD      . lidocaine (PF) (XYLOCAINE) 1 % injection 10 mL  10 mL Subcutaneous Once Mohammed Kindle, MD      . lidocaine (PF) (XYLOCAINE) 1 % injection 10 mL  10 mL Subcutaneous Once Mohammed Kindle, MD      . lidocaine (PF) (XYLOCAINE) 1 % injection 10 mL  10 mL Subcutaneous Once Mohammed Kindle, MD      . midazolam (VERSED) 5 MG/5ML injection 5 mg  5 mg Intravenous Once Mohammed Kindle, MD      . midazolam (VERSED) 5 MG/5ML injection 5 mg  5 mg Intravenous Once Mohammed Kindle, MD      . midazolam (VERSED) 5 MG/5ML injection 5 mg  5 mg Intravenous Once Mohammed Kindle, MD      . midazolam (VERSED) injection 5 mg  5 mg Intravenous Once Mohammed Kindle, MD      . orphenadrine (NORFLEX) injection 60 mg  60 mg Intramuscular Once Mohammed Kindle, MD      . orphenadrine (NORFLEX) injection 60 mg  60 mg Intramuscular Once Mohammed Kindle, MD      . orphenadrine (NORFLEX) injection 60 mg  60 mg Intramuscular Once Mohammed Kindle, MD      . sodium chloride 0.9 % injection 20 mL  20 mL Other Once Mohammed Kindle, MD      . sodium chloride flush (NS) 0.9 % injection 20 mL  20 mL Other Once Mohammed Kindle, MD      . triamcinolone acetonide (KENALOG-40) injection 40 mg  40 mg Other Once Mohammed Kindle, MD      . triamcinolone acetonide (KENALOG-40) injection 40 mg  40 mg Other Once Mohammed Kindle, MD      . triamcinolone acetonide (KENALOG-40) injection 40 mg  40 mg Other Once Mohammed Kindle, MD      . triamcinolone acetonide (KENALOG-40) injection  40 mg  40 mg Other Once Mohammed Kindle, MD        Past Medical History  Diagnosis Date  . GERD (gastroesophageal reflux disease)   . Hyperlipidemia   . Hypertension   . Enlarged liver   . Diabetes mellitus   . Low back pain   . Depression with anxiety   . CAD (coronary artery disease)   . Von Willebrand disease ([place])   . Migraines   . Depression   . Persistent disorder of initiating or maintaining sleep   . Generalized anxiety disorder   . Bipolar depression ([place])   . Anxiety   . Neuropathy [facility])     Past Surgical History  Procedure Laterality Date  . Cardiac catheterization      CAD,PCI of LAD, Cypher stent 2.5-28mm  . Cardiac catheterization      PCI of mid-LAD in stent restenosis with a drug-eluting stent  . Breast implant removal    . Coronary artery bypass graft   08-12-2011    CABG x 3  . Cardiac catheterization  06/03/13    [facility];MID LAD 100 % STENOSIS; PRIOR STENT; MID RCA 40 % STENOSIS  . Coronary stent placement    . Lumbar fusion      L1-S1  . Abdominal hysterectomy    . Back surgery      Social History  reports that she quit smoking about 5 years ago. Her smoking use included Cigarettes. She quit after 16 years of use. She has never used smokeless tobacco. She reports that she does not drink  alcohol or use illicit drugs.  Family History family history includes Alcohol abuse in her brother, father, and mother; Aneurysm in her brother and father; Diabetes in her father, mother, and other; Hyperlipidemia in her other; Hypertension in her brother, father, and other.       Review of Systems  Constitutional: Negative.   Respiratory: Negative.   Cardiovascular: Negative.   Gastrointestinal: Negative.   Musculoskeletal: Positive for back pain.  Skin: Negative.   Allergic/Immunologic: Negative.   Neurological: Negative.   Hematological: Negative.   Psychiatric/Behavioral: Negative.   All other systems reviewed and are negative.  BP 150/98 mmHg  Pulse 84   Ht '5\' 3"'  (1.6 m)  Wt 166 lb 8 oz (75.524 kg)  BMI 29.50 kg/m2  Physical Exam  Constitutional: She is oriented to person, place, and time. She appears well-developed and well-nourished.  Obese  HENT:  Head: Normocephalic.  Nose: Nose normal.  Mouth/Throat: Oropharynx is clear and moist.  Eyes: Conjunctivae are normal. Pupils are equal, round, and reactive to light.  Neck: Normal range of motion. Neck supple. No JVD present.  Cardiovascular: Normal rate, regular rhythm, S1 normal, S2 normal, normal heart sounds and intact distal pulses.  Exam reveals no gallop and no friction rub.   No murmur heard. Well-healed mediastinal scar  Pulmonary/Chest: Effort normal and breath sounds normal. No respiratory distress. She has no wheezes. She has no rales. She exhibits no tenderness.  Abdominal: Soft. Bowel sounds are normal. She exhibits no distension. There is no tenderness.  Musculoskeletal: Normal range of motion. She exhibits no edema or tenderness.  Lymphadenopathy:    She has no cervical adenopathy.  Neurological: She is alert and oriented to person, place, and time. Coordination normal.  Skin: Skin is warm and dry. No rash noted. No erythema.  Psychiatric: She has a normal mood and affect. Her behavior is normal. Judgment and thought content normal.    Assessment and Plan  Nursing note and vitals reviewed.

## 2016-01-23 NOTE — Assessment & Plan Note (Signed)
Currently with no symptoms of angina. No further workup at this time. Continue current medication regimen. 

## 2016-01-23 NOTE — Patient Instructions (Addendum)
You are doing well.  Please increase the amlodipine up to 5 mg twice a day  Please restart atorvastatin one a day (for cholesterol)  Please call us if you have new issues that need to be addressed before your next appt.  Your physician wants you to follow-up in: 6 months.  You will receive a reminder letter in the mail two months in advance. If you don't receive a letter, please call our office to schedule the follow-up appointment.

## 2016-01-23 NOTE — Assessment & Plan Note (Signed)
Blood pressure is elevated on today's visit, recommended she increase amlodipine up to 5 mg twice a day Would recommend monitoring blood pressure at home

## 2016-02-13 ENCOUNTER — Encounter: Payer: Self-pay | Admitting: Pain Medicine

## 2016-02-13 ENCOUNTER — Ambulatory Visit: Attending: Pain Medicine | Admitting: Pain Medicine

## 2016-02-13 VITALS — BP 172/87 | HR 72 | Temp 98.4°F | Resp 16 | Ht 63.0 in | Wt 160.0 lb

## 2016-02-13 DIAGNOSIS — M48062 Spinal stenosis, lumbar region with neurogenic claudication: Secondary | ICD-10-CM

## 2016-02-13 DIAGNOSIS — G5602 Carpal tunnel syndrome, left upper limb: Secondary | ICD-10-CM

## 2016-02-13 DIAGNOSIS — M47896 Other spondylosis, lumbar region: Secondary | ICD-10-CM | POA: Diagnosis not present

## 2016-02-13 DIAGNOSIS — M5010 Cervical disc disorder with radiculopathy, unspecified cervical region: Secondary | ICD-10-CM

## 2016-02-13 DIAGNOSIS — M5126 Other intervertebral disc displacement, lumbar region: Secondary | ICD-10-CM | POA: Diagnosis not present

## 2016-02-13 DIAGNOSIS — M545 Low back pain: Secondary | ICD-10-CM | POA: Diagnosis present

## 2016-02-13 DIAGNOSIS — M4806 Spinal stenosis, lumbar region: Secondary | ICD-10-CM | POA: Diagnosis not present

## 2016-02-13 DIAGNOSIS — M79606 Pain in leg, unspecified: Secondary | ICD-10-CM | POA: Diagnosis present

## 2016-02-13 DIAGNOSIS — M797 Fibromyalgia: Secondary | ICD-10-CM

## 2016-02-13 DIAGNOSIS — M961 Postlaminectomy syndrome, not elsewhere classified: Secondary | ICD-10-CM

## 2016-02-13 DIAGNOSIS — M5030 Other cervical disc degeneration, unspecified cervical region: Secondary | ICD-10-CM

## 2016-02-13 DIAGNOSIS — Z981 Arthrodesis status: Secondary | ICD-10-CM | POA: Insufficient documentation

## 2016-02-13 DIAGNOSIS — M5481 Occipital neuralgia: Secondary | ICD-10-CM

## 2016-02-13 DIAGNOSIS — M5116 Intervertebral disc disorders with radiculopathy, lumbar region: Secondary | ICD-10-CM | POA: Insufficient documentation

## 2016-02-13 DIAGNOSIS — M5416 Radiculopathy, lumbar region: Secondary | ICD-10-CM

## 2016-02-13 DIAGNOSIS — M47816 Spondylosis without myelopathy or radiculopathy, lumbar region: Secondary | ICD-10-CM

## 2016-02-13 DIAGNOSIS — M5137 Other intervertebral disc degeneration, lumbosacral region: Secondary | ICD-10-CM

## 2016-02-13 MED ORDER — CARISOPRODOL 350 MG PO TABS: ORAL_TABLET | ORAL | Status: DC

## 2016-02-13 MED ORDER — HYDROCODONE-ACETAMINOPHEN 10-325 MG PO TABS: ORAL_TABLET | ORAL | Status: DC

## 2016-02-13 NOTE — Progress Notes (Signed)
Subjective:    Patient ID: Jennifer [name], female    DOB: 04-Mar-1956, 60 y.o.   MRN: [id number]  HPI  The patient is a 60 year old female who returns to pain management for further evaluation and treatment of pain involving the region of the lower back and lower extremity region. The patient states that the pain involves the right lower extremity more than the left lower extremity. The patient has had improvement with interventional treatment performed in pain management. We discussed patient undergoing neurosurgical evaluation and we'll request once again for patient to be scheduled for neurosurgical evaluation of lower back and lower extremity pain paresthesias and weakness. There is concern regarding abnormalities contributing to patient's symptomatology noted on MRI. We will continue medications consisting of Lyrica Soma and hydrocodone acetaminophen at this time and will consider modifications of treatment pending follow-up evaluation. The patient agreed to suggested treatment plan The patient denied any trauma change in events of daily living the call significant change in symptomatology     Review of Systems     Objective:   Physical Exam  There was tenderness to palpation of paraspinal musculatures and the cervical region cervical facet region a mild degree with mild tenderness of the splenius capitis and occipitalis musculature region. Palpation of the acromioclavicular and glenohumeral joint regions were with tends to palpation of mild degree. The patient appeared to be with unremarkable Spurling's maneuver. The patient appeared to be with out significant increase of pain with Tinel and Phalen's maneuver and appeared to be with slightly decreased grip strength. Palpation over the thoracic facet thoracic paraspinal musculature region was attends to palpation of moderate degree in the lower thoracic region with evidence of muscle spasm of the lower thoracic paraspinal musculature region  with no crepitus of the thoracic region noted. Palpation over the lumbar paraspinal musculatures and lumbar facet region was without increased pain with lateral bending rotation extension and palpation of the lumbar facets reproducing moderate discomfort. Straight leg raising was tolerates approximately 20 without increased pain with dorsiflexion noted. There appeared to be negative clonus negative Homans. DTRs were difficult to elicit patient had difficulty relaxing. There was question decreased sensation L5 dermatomal distribution with questionably decreased EHL strength. There was negative clonus negative Homans.  Palpation of the PSIS and PII S region was attends to palpation of moderate degree. The abdomen was nontender and no costovertebral tenderness was noted      Assessment & Plan:     Neuropathy of the lower extremities  Degenerative disc disease lumbar spine  MRI revealed degenerative changes with prior surgical intervention lumbar region, L5-S1 posterior lumbar interbody fusion, 10 x 12 mm right facet intraspinal synovial cyst, mass effect upon the right lateral thecal sac and mild medial displacement of the S1 nerve root as it abutting the foraminal portion of the L5 nerve root. L4-L5 broad-based disc bulge with moderate bilateral facet arthropathy and mild right foraminal stenosis   Lumbar stenosis with neurogenic claudication  Lumbar radiculopathy  Lumbar facet syndrome   Lumbar stenosis with neurogenic claudication     PLAN   Continue present medications  Lumbar sympathetic block to be performed at time of return appointment  F/U PCP C Wicker for evaliation of  BP and general medical  condition  F/U surgical evaluation. . The patient is to undergo neurosurgical evaluation of pain of the cervical and upper extremity region as well as lumbar and lower extremity region. There is concern regarding intraspinal abnormalities of the lumbar region contributing  to patient's  symptomatology at this time  Cervical CT scan and lumbar CT scan has been ordered for you Ask nurses and secretaries the date of your cervical CT scan and your lumbar CT scan   F/U surgical eval of shoulder as discussed  F/U neurological evaluation. May consider PNCV/EMG studies additional neurological evaluations pending follow-up evaluations  May consider radiofrequency rhizolysis or intraspinal procedures pending response to present treatment and F/U evaluation   Patient to call Pain Management Center should patient have concerns prior to scheduled return appointment.

## 2016-02-13 NOTE — Patient Instructions (Addendum)
PLAN   Continue present medication Lyrica Soma and hydrocodone acetaminophen  Lumbosacral selective nerve root block to be performed at time of return appointment  F/U PCP Dr.Olmedo  for evaliation of  BP and general medical  condition  F/U surgical evaluation. Neurosurgical evaluation as planned Please ask Shatoya the date of neurosurgical evaluation of lower back and lower extremity pain  TENS unit  use as discussed  Please ask Shatoya the date of your PNCV/EMG studies of the upper extremities with Dr. Melrose Nakayama  F/U neurological evaluation for PNCV/EMG studies with Dr. Melrose Nakayama  May consider radiofrequency rhizolysis or intraspinal procedures pending response to present treatment and F/U evaluation   Patient to call Pain Management Center should patient have concerns prior to scheduled return appointment.  Selective Nerve Root Block Patient Information  Description: Specific nerve roots exit the spinal canal and these nerves can be compressed and inflamed by a bulging disc and bone spurs.  By injecting steroids on the nerve root, we can potentially decrease the inflammation surrounding these nerves, which often leads to decreased pain.  Also, by injecting local anesthesia on the nerve root, this can provide [place] helpful information to give to your referring doctor if it decreases your pain.  Selective nerve root blocks can be done along the spine from the neck to the low back depending on the location of your pain.   After numbing the skin with local anesthesia, a small needle is passed to the nerve root and the position of the needle is verified using x-ray pictures.  After the needle is in correct position, we then deposit the medication.  You may experience a pressure sensation while this is being done.  The entire block usually lasts less than 15 minutes.  Conditions that may be treated with selective nerve root blocks:  Low back and leg pain  Spinal stenosis  Diagnostic block prior  to potential surgery  Neck and arm pain  Post laminectomy syndrome  Preparation for the injection:  1. Do not eat any solid food or dairy products within 8 hours of your appointment. 2. You may drink clear liquids up to 3 hours before an appointment.  Clear liquids include water, black coffee, juice or soda.  No milk or cream please. 3. You may take your regular medications, including pain medications, with a sip of water before your appointment.  Diabetics should hold regular insulin (if taken separately) and take 1/2 normal NPH dose the morning of the procedure.  Carry some sugar containing items with you to your appointment. 4. A driver must accompany you and be prepared to drive you home after your procedure. 5. Bring all your current medications with you. 6. An IV may be inserted and sedation may be given at the discretion of the physician. 7. A blood pressure cuff, EKG, and other monitors will often be applied during the procedure.  Some patients may need to have extra oxygen administered for a short period. 8. You will be asked to provide medical information, including allergies, prior to the procedure.  We must know immediately if you are taking blood  Thinners (like Coumadin) or if you are allergic to IV iodine contrast (dye).  Possible side-effects: All are usually temporary  Bleeding from needle site  Light headedness  Numbness and tingling  Decreased blood pressure  Weakness in arms/legs  Pressure sensation in back/neck  Pain at injection site (several days)  Possible complications: All are extremely rare  Infection  Nerve injury  Spinal  headache (a headache wore with upright position)  Call if you experience:  Fever/chills associated with headache or increased back/neck pain  Headache worsened by an upright position  New onset weakness or numbness of an extremity below the injection site  Hives or difficulty breathing (go to the emergency  room)  Inflammation or drainage at the injection site(s)  Severe back/neck pain greater than usual  New symptoms which are concerning to you  Please note:  Although the local anesthetic injected can often make your back or neck feel good for several hours after the injection the pain will likely return.  It takes 3-5 days for steroids to work on the nerve root. You may not notice any pain relief for at least one week.  If effective, we will often do a series of 3 injections spaced 3-6 weeks apart to maximally decrease your pain.    If you have any questions, please call [phone] [facility]

## 2016-02-13 NOTE — Progress Notes (Signed)
Safety precautions to be maintained throughout the outpatient stay will include: orient to surroundings, keep bed in low position, maintain call bell within reach at all times, provide assistance with transfer out of bed and ambulation.  

## 2016-02-20 ENCOUNTER — Encounter: Payer: Self-pay | Admitting: Psychiatry

## 2016-02-20 ENCOUNTER — Ambulatory Visit (INDEPENDENT_AMBULATORY_CARE_PROVIDER_SITE_OTHER): Admitting: Psychiatry

## 2016-02-20 VITALS — BP 138/90 | HR 83 | Temp 97.5°F | Ht 63.0 in | Wt 165.6 lb

## 2016-02-20 DIAGNOSIS — F411 Generalized anxiety disorder: Secondary | ICD-10-CM | POA: Diagnosis not present

## 2016-02-20 DIAGNOSIS — F3130 Bipolar disorder, current episode depressed, mild or moderate severity, unspecified: Secondary | ICD-10-CM

## 2016-02-20 MED ORDER — LITHIUM CARBONATE 300 MG PO CAPS: 300.0000 mg | ORAL_CAPSULE | Freq: Every day | ORAL | Status: DC

## 2016-02-20 MED ORDER — QUETIAPINE FUMARATE 300 MG PO TABS: 300.0000 mg | ORAL_TABLET | Freq: Every day | ORAL | Status: DC

## 2016-02-20 MED ORDER — VILAZODONE HCL 40 MG PO TABS: 40.0000 mg | ORAL_TABLET | Freq: Every day | ORAL | Status: DC

## 2016-02-20 MED ORDER — ZOLPIDEM TARTRATE 5 MG PO TABS: 5.0000 mg | ORAL_TABLET | Freq: Every day | ORAL | Status: DC

## 2016-02-20 MED ORDER — ALPRAZOLAM 0.5 MG PO TABS: 0.5000 mg | ORAL_TABLET | Freq: Every evening | ORAL | Status: DC | PRN

## 2016-02-20 NOTE — Progress Notes (Signed)
BH MD/PA/NP OP Progress Note  02/20/2016 8:57 AM Jennifer [name]  MRN:  [id number]  Subjective:    Patient is a 60 year old married female who presented for the follow-up appointment. She reported that she has some stress related to her husband who has been drinking iced tea which has some alcohol in it. She reported that he will sit on the porch and will consume the drinks at least 2-3 times per day. She reported that she is concerned about his medical conditions. She reported that he follows with his PCP at [facility]. Patient reported that she has been getting nerve blocks on a monthly basis which is helping her. She is has decrease the use of pain medications and is feeling better. She has appointment tomorrow as well. She currently denied having any worsening of her mood symptoms. She sleeps well with Ambien. She reported that she takes Xanax on a when necessary basis. She appeared calm and collective during the interview. She denied having any perceptual disturbances.   Chief Complaint:  Chief Complaint    Follow-up; Medication Refill     Visit Diagnosis:     ICD-9-CM ICD-10-CM   1. Bipolar I disorder, most recent episode depressed ([place]) 296.50 F31.30   2. Generalized anxiety disorder 300.02 F41.1     Past Medical History:  Past Medical History  Diagnosis Date  . GERD (gastroesophageal reflux disease)   . Hyperlipidemia   . Hypertension   . Enlarged liver   . Diabetes mellitus   . Low back pain   . Depression with anxiety   . CAD (coronary artery disease)   . Von Willebrand disease ([place])   . Migraines   . Depression   . Persistent disorder of initiating or maintaining sleep   . Generalized anxiety disorder   . Bipolar depression ([place])   . Anxiety   . Neuropathy ([place])   . 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 Cardiac catheterization      CAD,PCI of LAD, Cypher stent 2.5-28mm  . Cardiac catheterization     PCI of mid-LAD in stent restenosis with a drug-eluting stent  . Breast implant removal    . Coronary artery bypass graft   08-12-2011    CABG x 3  . Cardiac catheterization  06/03/13    [facility];MID LAD 100 % STENOSIS; PRIOR STENT; MID RCA 40 % STENOSIS  . Coronary stent placement    . Lumbar fusion      L1-S1  . Abdominal hysterectomy    . Back surgery     Family History:  Family History  Problem Relation Age of Onset  . Hypertension Other     family hx  . Hyperlipidemia Other     family hx  . Diabetes Other     family hx  . Aneurysm Father     abdominal  . Diabetes Father   . Alcohol abuse Father   . Hypertension Father   . Aneurysm Brother     abdominal  . Alcohol abuse Brother   . Hypertension Brother   . Diabetes Mother   . Alcohol abuse Mother    Social History:  Social History   Social History  . Marital Status: Married    Spouse Name: N/A  . Number of Children: N/A  . Years of Education: N/A   Social History Main Topics  . Smoking status: Former Smoker -- 16 years  Types: Cigarettes    Quit date: 06/20/2010  . Smokeless tobacco: Never Used  . Alcohol Use: No  . Drug Use: No  . Sexual Activity: Yes   Other Topics Concern  . None   Social History Narrative   Additional History:  Lives with her family members and has good relationship with them.  Assessment:   Musculoskeletal: Strength & Muscle Tone: within normal limits Gait & Station: normal Patient leans: N/A  Psychiatric Specialty Exam: Depression        Associated symptoms include insomnia and myalgias.  Associated symptoms include no suicidal ideas.   Review of Systems  Constitutional: Positive for malaise/fatigue.  HENT: Negative.  Negative for nosebleeds and tinnitus.   Eyes: Negative.  Negative for photophobia.  Respiratory: Negative.   Cardiovascular: Negative.  Negative for palpitations.  Gastrointestinal: Negative for nausea and diarrhea.  Genitourinary: Negative for frequency.   Musculoskeletal: Positive for myalgias and back pain.  Skin: Negative.   Neurological: Negative for tremors.  Endo/Heme/Allergies: Negative for environmental allergies.  Psychiatric/Behavioral: Positive for depression. Negative for suicidal ideas and substance abuse. The patient is nervous/anxious and has insomnia.   All other systems reviewed and are negative.   Blood pressure 138/90, pulse 83, temperature 97.5 F (36.4 C), temperature source Tympanic, height 5' 3" (1.6 m), weight 165 lb 9.6 oz (75.116 kg), SpO2 95 %.Body mass index is 29.34 kg/(m^2).  General Appearance: Casual  Eye Contact:  Fair  Speech:  Slow  Volume:  Decreased  Mood:  Anxious and Depressed  Affect:  Congruent  Thought Process:  Logical  Orientation:  Full (Time, Place, and Person)  Thought Content:  WDL  Suicidal Thoughts:  No  Homicidal Thoughts:  No  Memory:  Immediate;   Fair  Judgement:  Fair  Insight:  Fair  Psychomotor Activity:  Normal  Concentration:  Fair  Recall:  [employer] of Knowledge: Fair  Language: Fair  Akathisia:  No  Handed:  Right  AIMS (if indicated):    Assets:  Communication Skills Desire for Improvement Housing Social Support  ADL's:  Intact  Cognition: WNL  Sleep:  good   Is the patient at risk to self?  No. Has the patient been a risk to self in the past 6 months?  No. Has the patient been a risk to self within the distant past?  Yes.   Is the patient a risk to others?  No. Has the patient been a risk to others in the past 6 months?  No. Has the patient been a risk to others within the distant past?  No.  Current Medications: Current Outpatient Prescriptions  Medication Sig Dispense Refill  . albuterol-ipratropium (COMBIVENT) 18-103 MCG/ACT inhaler Inhale into the lungs as needed for wheezing or shortness of breath.    . ALPRAZolam (XANAX) 0.5 MG tablet Take 1 tablet (0.5 mg total) by mouth at bedtime as needed for anxiety. 30 tablet 2  . amLODipine (NORVASC) 5 MG  tablet Take 1 tablet (5 mg total) by mouth 2 (two) times daily. 180 tablet 4  . atorvastatin (LIPITOR) 40 MG tablet Take 1 tablet (40 mg total) by mouth daily. 90 tablet 3  . Blood Glucose Monitoring Suppl (FREESTYLE FREEDOM LITE) W/DEVICE KIT Use 3 (three) times daily. 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 Dx Code: 250.00    . carisoprodol (SOMA) 350 MG tablet Limit 1 tablet by mouth per day or twice per day if tolerated 50 tablet 0  . carvedilol (COREG)  12.5 MG tablet Take 1 tablet (12.5 mg total) by mouth 2 (two) times daily with a meal. 180 tablet 3  . fluticasone (FLONASE) 50 MCG/ACT nasal spray Place into the nose.    Marland Kitchen HYDROcodone-acetaminophen (NORCO) 10-325 MG tablet Limit 3-6 tabs by mouth per day if tolerated 180 tablet 0  . Insulin Isophane & Regular (HUMULIN 70/30 PEN [place]) Inject 27 Units into the skin 2 (two) times daily.     Marland Kitchen lisinopril (PRINIVIL,ZESTRIL) 20 MG tablet Take 1 tablet (20 mg total) by mouth daily. 90 tablet 3  . lithium carbonate 300 MG capsule Take 1 capsule (300 mg total) by mouth daily. 90 capsule 2  . metFORMIN (GLUCOPHAGE) 500 MG tablet Take 1,000 mg by mouth 2 (two) times daily with a meal.    . nitroGLYCERIN (NITROSTAT) 0.4 MG SL tablet Place 1 tablet (0.4 mg total) under the tongue every 5 (five) minutes as needed for chest pain. 25 tablet 6  . omeprazole (PRILOSEC) 20 MG capsule Take 20 mg by mouth daily.     . pregabalin (LYRICA) 50 MG capsule Limit  one tablet by mouth per day or twice per day if tolerated 60 capsule 2  . PROAIR HFA 108 (90 Base) MCG/ACT inhaler     . QUEtiapine (SEROQUEL) 300 MG tablet Take 1 tablet (300 mg total) by mouth at bedtime. Take 1.5 tablet at bed time. 135 tablet 2  . RESTASIS 0.05 % ophthalmic emulsion     . Spacer/Aero-Holding Chambers (E-Z SPACER) inhaler Reported on 01/15/2016    . Vilazodone HCl (VIIBRYD) 40 MG TABS Take 1 tablet (40 mg total) by mouth daily. 90 tablet 2  . zolpidem (AMBIEN) 5 MG tablet Take 1 tablet (5  mg total) by mouth at bedtime. 30 tablet 2   Current Facility-Administered Medications  Medication Dose Route Frequency Provider Last Rate Last Dose  . bupivacaine (PF) (MARCAINE) 0.25 % injection 30 mL  30 mL Other Once Mohammed Kindle, MD      . bupivacaine (PF) (MARCAINE) 0.25 % injection 30 mL  30 mL Other Once Mohammed Kindle, MD      . bupivacaine (PF) (MARCAINE) 0.25 % injection 30 mL  30 mL Other Once Mohammed Kindle, MD      . ceFAZolin (ANCEF) IVPB 1 g/50 mL premix  1 g Intravenous Once Mohammed Kindle, MD      . ceFAZolin (ANCEF) IVPB 1 g/50 mL premix  1 g Intravenous Once Mohammed Kindle, MD      . ceFAZolin (ANCEF) IVPB 1 g/50 mL premix  1 g Intravenous Once Mohammed Kindle, MD      . ceFAZolin (ANCEF) IVPB 1 g/50 mL premix  1 g Intravenous Once Mohammed Kindle, MD      . fentaNYL (SUBLIMAZE) injection 100 mcg  100 mcg Intravenous Once Mohammed Kindle, MD      . fentaNYL (SUBLIMAZE) injection 100 mcg  100 mcg Intravenous Once Mohammed Kindle, MD      . fentaNYL (SUBLIMAZE) injection 100 mcg  100 mcg Intravenous Once Mohammed Kindle, MD      . lactated ringers infusion 1,000 mL  1,000 mL Intravenous Continuous Mohammed Kindle, MD      . lactated ringers infusion 1,000 mL  1,000 mL Intravenous Continuous Mohammed Kindle, MD      . lactated ringers infusion 1,000 mL  1,000 mL Intravenous Continuous Mohammed Kindle, MD      . lactated ringers infusion 1,000 mL  1,000 mL Intravenous Continuous Mohammed Kindle, MD      .  lidocaine (PF) (XYLOCAINE) 1 % injection 10 mL  10 mL Subcutaneous Once Mohammed Kindle, MD      . lidocaine (PF) (XYLOCAINE) 1 % injection 10 mL  10 mL Subcutaneous Once Mohammed Kindle, MD      . lidocaine (PF) (XYLOCAINE) 1 % injection 10 mL  10 mL Subcutaneous Once Mohammed Kindle, MD      . midazolam (VERSED) 5 MG/5ML injection 5 mg  5 mg Intravenous Once Mohammed Kindle, MD      . midazolam (VERSED) 5 MG/5ML injection 5 mg  5 mg Intravenous Once Mohammed Kindle, MD      . midazolam (VERSED) 5 MG/5ML  injection 5 mg  5 mg Intravenous Once Mohammed Kindle, MD      . midazolam (VERSED) injection 5 mg  5 mg Intravenous Once Mohammed Kindle, MD      . orphenadrine (NORFLEX) injection 60 mg  60 mg Intramuscular Once Mohammed Kindle, MD      . orphenadrine (NORFLEX) injection 60 mg  60 mg Intramuscular Once Mohammed Kindle, MD      . orphenadrine (NORFLEX) injection 60 mg  60 mg Intramuscular Once Mohammed Kindle, MD      . sodium chloride 0.9 % injection 20 mL  20 mL Other Once Mohammed Kindle, MD      . sodium chloride flush (NS) 0.9 % injection 20 mL  20 mL Other Once Mohammed Kindle, MD      . triamcinolone acetonide (KENALOG-40) injection 40 mg  40 mg Other Once Mohammed Kindle, MD      . triamcinolone acetonide (KENALOG-40) injection 40 mg  40 mg Other Once Mohammed Kindle, MD      . triamcinolone acetonide (KENALOG-40) injection 40 mg  40 mg Other Once Mohammed Kindle, MD      . triamcinolone acetonide (KENALOG-40) injection 40 mg  40 mg Other Once Mohammed Kindle, MD        Medical Decision Making:  Review of Psycho-Social Stressors (1) and Review of Last Therapy Session (1)  Treatment Plan Summary:Medication management   Anxiety Patient will continue on Viibryd 65m po qam and Xanax 0.5 mg po qhs prn as prescribed  Mood symptoms She will continue on Seroquel 300 mg 1-1/2 pill at bedtime. She was given 90 day supply of the medication Patient is also on lithium carbonate 300 mg daily at bedtime    Insomnia Patient takes Ambien 5 mg at bedtime and discussed with her about the adverse effects of the medication in detail and she demonstrated understanding  Follow-up She will follow up in 2 months or earlier depending on her symptoms     More than 50% of the time spent in psychoeducation, counseling and coordination of care.  Time spent with the patient 25 minutes   This note was generated in part or whole with voice recognition software. Voice regonition is usually quite accurate but there are  transcription errors that can and very often do occur. I apologize for any typographical errors that were not detected and corrected.    URainey Pines MD  02/20/2016, 8:57 AM

## 2016-02-21 ENCOUNTER — Ambulatory Visit: Attending: Pain Medicine | Admitting: Pain Medicine

## 2016-02-21 ENCOUNTER — Encounter: Payer: Self-pay | Admitting: Pain Medicine

## 2016-02-21 DIAGNOSIS — Z981 Arthrodesis status: Secondary | ICD-10-CM | POA: Diagnosis not present

## 2016-02-21 DIAGNOSIS — Z9889 Other specified postprocedural states: Secondary | ICD-10-CM | POA: Insufficient documentation

## 2016-02-21 DIAGNOSIS — M4806 Spinal stenosis, lumbar region: Secondary | ICD-10-CM | POA: Diagnosis not present

## 2016-02-21 DIAGNOSIS — M5137 Other intervertebral disc degeneration, lumbosacral region: Secondary | ICD-10-CM

## 2016-02-21 DIAGNOSIS — M5416 Radiculopathy, lumbar region: Secondary | ICD-10-CM

## 2016-02-21 DIAGNOSIS — M961 Postlaminectomy syndrome, not elsewhere classified: Secondary | ICD-10-CM

## 2016-02-21 DIAGNOSIS — M47816 Spondylosis without myelopathy or radiculopathy, lumbar region: Secondary | ICD-10-CM | POA: Insufficient documentation

## 2016-02-21 DIAGNOSIS — M5481 Occipital neuralgia: Secondary | ICD-10-CM

## 2016-02-21 DIAGNOSIS — M5136 Other intervertebral disc degeneration, lumbar region: Secondary | ICD-10-CM | POA: Insufficient documentation

## 2016-02-21 DIAGNOSIS — M545 Low back pain: Secondary | ICD-10-CM | POA: Diagnosis present

## 2016-02-21 DIAGNOSIS — M48062 Spinal stenosis, lumbar region with neurogenic claudication: Secondary | ICD-10-CM

## 2016-02-21 DIAGNOSIS — M797 Fibromyalgia: Secondary | ICD-10-CM

## 2016-02-21 DIAGNOSIS — M79606 Pain in leg, unspecified: Secondary | ICD-10-CM | POA: Diagnosis present

## 2016-02-21 DIAGNOSIS — M5030 Other cervical disc degeneration, unspecified cervical region: Secondary | ICD-10-CM

## 2016-02-21 MED ORDER — ORPHENADRINE CITRATE 30 MG/ML IJ SOLN
INTRAMUSCULAR | Status: AC
  Administered 2016-02-21: 14:00:00
  Filled 2016-02-21: qty 2

## 2016-02-21 MED ORDER — MIDAZOLAM HCL 5 MG/5ML IJ SOLN
INTRAMUSCULAR | Status: AC
  Administered 2016-02-21: 3 mg via INTRAVENOUS
  Filled 2016-02-21: qty 5

## 2016-02-21 MED ORDER — CEFAZOLIN SODIUM 1-5 GM-% IV SOLN: 1.0000 g | Freq: Once | INTRAVENOUS | Status: DC

## 2016-02-21 MED ORDER — BUPIVACAINE HCL (PF) 0.25 % IJ SOLN: 30.0000 mL | Freq: Once | INTRAMUSCULAR | Status: DC

## 2016-02-21 MED ORDER — CEFAZOLIN SODIUM 1 G IJ SOLR
INTRAMUSCULAR | Status: AC
  Administered 2016-02-21: 13:00:00
  Filled 2016-02-21: qty 10

## 2016-02-21 MED ORDER — MIDAZOLAM HCL 5 MG/5ML IJ SOLN: 5.0000 mg | Freq: Once | INTRAMUSCULAR | Status: DC

## 2016-02-21 MED ORDER — FENTANYL CITRATE (PF) 100 MCG/2ML IJ SOLN
INTRAMUSCULAR | Status: AC
  Administered 2016-02-21: 100 ug via INTRAVENOUS
  Filled 2016-02-21: qty 2

## 2016-02-21 MED ORDER — LIDOCAINE HCL (PF) 1 % IJ SOLN: 10.0000 mL | Freq: Once | INTRAMUSCULAR | Status: DC

## 2016-02-21 MED ORDER — TRIAMCINOLONE ACETONIDE 40 MG/ML IJ SUSP
INTRAMUSCULAR | Status: AC
  Administered 2016-02-21: 14:00:00
  Filled 2016-02-21: qty 1

## 2016-02-21 MED ORDER — TRIAMCINOLONE ACETONIDE 40 MG/ML IJ SUSP: 40.0000 mg | Freq: Once | INTRAMUSCULAR | Status: DC

## 2016-02-21 MED ORDER — CEFUROXIME AXETIL 250 MG PO TABS: 250.0000 mg | ORAL_TABLET | Freq: Two times a day (BID) | ORAL | Status: DC

## 2016-02-21 MED ORDER — BUPIVACAINE HCL (PF) 0.25 % IJ SOLN
INTRAMUSCULAR | Status: AC
  Administered 2016-02-21: 13:00:00
  Filled 2016-02-21: qty 30

## 2016-02-21 MED ORDER — ORPHENADRINE CITRATE 30 MG/ML IJ SOLN: 60.0000 mg | Freq: Once | INTRAMUSCULAR | Status: DC

## 2016-02-21 MED ORDER — LACTATED RINGERS IV SOLN: 1000.0000 mL | INTRAVENOUS | Status: DC

## 2016-02-21 MED ORDER — FENTANYL CITRATE (PF) 100 MCG/2ML IJ SOLN: 100.0000 ug | Freq: Once | INTRAMUSCULAR | Status: DC

## 2016-02-21 NOTE — Progress Notes (Signed)
Safety precautions to be maintained throughout the outpatient stay will include: orient to surroundings, keep bed in low position, maintain call bell within reach at all times, provide assistance with transfer out of bed and ambulation.  

## 2016-02-21 NOTE — Patient Instructions (Addendum)
PLAN   Continue present medication Lyrica and hydrocodone and begin taking antibiotics Ceftin today as prescribed  F/U PCP Dr.Olmedo  for evaliation of  BP and general medical  condition  F/U surgical evaluation. Neurosurgical evaluation as planned  F/U neurological evaluation. May consider PNCV EMG studies and other studies pending follow-up evaluations  May consider radiofrequency rhizolysis or intraspinal procedures pending response to present treatment and F/U evaluation   Patient to call Pain Management Center should patient have concerns prior to scheduled return appointmentPain Management Discharge Instructions  General Discharge Instructions :  If you need to reach your doctor call: Monday-Friday 8:00 am - 4:00 pm at [phone] or toll free [phone].  After clinic hours [phone] to have operator reach doctor.  Bring all of your medication bottles to all your appointments in the pain clinic.  To cancel or reschedule your appointment with Pain Management please remember to call 24 hours in advance to avoid a fee.  Refer to the educational materials which you have been given on: General Risks, I had my Procedure. Discharge Instructions, Post Sedation.  Post Procedure Instructions:  The drugs you were given will stay in your system until tomorrow, so for the next 24 hours you should not drive, make any legal decisions or drink any alcoholic beverages.  You may eat anything you prefer, but it is better to start with liquids then soups and crackers, and gradually work up to solid foods.  Please notify your doctor immediately if you have any unusual bleeding, trouble breathing or pain that is not related to your normal pain.  Depending on the type of procedure that was done, some parts of your body may feel week and/or numb.  This usually clears up by tonight or the next day.  Walk with the use of an assistive device or accompanied by an adult for the 24 hours.  You  may use ice on the affected area for the first 24 hours.  Put ice in a Ziploc bag and cover with a towel and place against area 15 minutes on 15 minutes off.  You may switch to heat after 24 hours.GENERAL RISKS AND COMPLICATIONS  What are the risk, side effects and possible complications? Generally speaking, most procedures are safe.  However, with any procedure there are risks, side effects, and the possibility of complications.  The risks and complications are dependent upon the sites that are lesioned, or the type of nerve block to be performed.  The closer the procedure is to the spine, the more serious the risks are.  Great care is taken when placing the radio frequency needles, block needles or lesioning probes, but sometimes complications can occur. 1. Infection: Any time there is an injection through the skin, there is a risk of infection.  This is why sterile conditions are used for these blocks.  There are four possible types of infection. 1. Localized skin infection. 2. Central Nervous System Infection-This can be in the form of Meningitis, which can be deadly. 3. Epidural Infections-This can be in the form of an epidural abscess, which can cause pressure inside of the spine, causing compression of the spinal cord with subsequent paralysis. This would require an emergency surgery to decompress, and there are no guarantees that the patient would recover from the paralysis. 4. Discitis-This is an infection of the intervertebral discs.  It occurs in about 1% of discography procedures.  It is difficult to treat and it may lead to surgery.  2. Pain: the needles have to go through skin and soft tissues, will cause soreness.       3. Damage to internal structures:  The nerves to be lesioned may be near blood vessels or    other nerves which can be potentially damaged.       4. Bleeding: Bleeding is more common if the patient is taking blood thinners such as  aspirin, Coumadin, Ticiid, Plavix,  etc., or if he/she have some genetic predisposition  such as hemophilia. Bleeding into the spinal canal can cause compression of the spinal  cord with subsequent paralysis.  This would require an emergency surgery to  decompress and there are no guarantees that the patient would recover from the  paralysis.       5. Pneumothorax:  Puncturing of a lung is a possibility, every time a needle is introduced in  the area of the chest or upper back.  Pneumothorax refers to free air around the  collapsed lung(s), inside of the thoracic cavity (chest cavity).  Another two possible  complications related to a similar event would include: Hemothorax and Chylothorax.   These are variations of the Pneumothorax, where instead of air around the collapsed  lung(s), you may have blood or chyle, respectively.       6. Spinal headaches: They may occur with any procedures in the area of the spine.       7. Persistent CSF (Cerebro-Spinal Fluid) leakage: This is a rare problem, but may occur  with prolonged intrathecal or epidural catheters either due to the formation of a fistulous  track or a dural tear.       8. Nerve damage: By working so close to the spinal cord, there is always a possibility of  nerve damage, which could be as serious as a permanent spinal cord injury with  paralysis.       9. Death:  Although rare, severe deadly allergic reactions known as "Anaphylactic  reaction" can occur to any of the medications used.      10. Worsening of the symptoms:  We can always make thing worse.  What are the chances of something like this happening? Chances of any of this occuring are extremely low.  By statistics, you have more of a chance of getting killed in a motor vehicle accident: while driving to the hospital than any of the above occurring .  Nevertheless, you should be aware that they are possibilities.  In general, it is similar to taking a shower.  Everybody knows that you can slip, hit your head and get killed.   Does that mean that you should not shower again?  Nevertheless always keep in mind that statistics do not mean anything if you happen to be on the wrong side of them.  Even if a procedure has a 1 (one) in a 1,000,000 (million) chance of going wrong, it you happen to be that one..Also, keep in mind that by statistics, you have more of a chance of having something go wrong when taking medications.  Who should not have this procedure? If you are on a blood thinning medication (e.g. Coumadin, Plavix, see list of "Blood Thinners"), or if you have an active infection going on, you should not have the procedure.  If you are taking any blood thinners, please inform your physician.  How should I prepare for this procedure?  Do not eat or drink anything at least six hours prior to the procedure.  Bring a driver  with you .  It cannot be a taxi.  Come accompanied by an adult that can drive you back, and that is strong enough to help you if your legs get weak or numb from the local anesthetic.  Take all of your medicines the morning of the procedure with just enough water to swallow them.  If you have diabetes, make sure that you are scheduled to have your procedure done first thing in the morning, whenever possible.  If you have diabetes, take only half of your insulin dose and notify our nurse that you have done so as soon as you arrive at the clinic.  If you are diabetic, but only take blood sugar pills (oral hypoglycemic), then do not take them on the morning of your procedure.  You may take them after you have had the procedure.  Do not take aspirin or any aspirin-containing medications, at least eleven (11) days prior to the procedure.  They may prolong bleeding.  Wear loose fitting clothing that may be easy to take off and that you would not mind if it got stained with Betadine or blood.  Do not wear any jewelry or perfume  Remove any nail coloring.  It will interfere with some of our monitoring  equipment.  NOTE: Remember that this is not meant to be interpreted as a complete list of all possible complications.  Unforeseen problems may occur.  BLOOD THINNERS The following drugs contain aspirin or other products, which can cause increased bleeding during surgery and should not be taken for 2 weeks prior to and 1 week after surgery.  If you should need take something for relief of minor pain, you may take acetaminophen which is found in Tylenol,m Datril, Anacin-3 and Panadol. It is not blood thinner. The products listed below are.  Do not take any of the products listed below in addition to any listed on your instruction sheet.  A.P.C or A.P.C with Codeine Codeine Phosphate Capsules #3 Ibuprofen Ridaura  ABC compound Congesprin Imuran rimadil  Advil Cope Indocin Robaxisal  Alka-Seltzer Effervescent Pain Reliever and Antacid Coricidin or Coricidin-D  Indomethacin Rufen  Alka-Seltzer plus Cold Medicine Cosprin Ketoprofen S-A-C Tablets  Anacin Analgesic Tablets or Capsules Coumadin Korlgesic Salflex  Anacin Extra Strength Analgesic tablets or capsules CP-2 Tablets Lanoril Salicylate  Anaprox Cuprimine Capsules Levenox Salocol  Anexsia-D Dalteparin Magan Salsalate  Anodynos Darvon compound Magnesium Salicylate Sine-off  Ansaid Dasin Capsules Magsal Sodium Salicylate  Anturane Depen Capsules Marnal Soma  APF Arthritis pain formula Dewitt's Pills Measurin Stanback  Argesic Dia-Gesic Meclofenamic Sulfinpyrazone  Arthritis Bayer Timed Release Aspirin Diclofenac Meclomen Sulindac  Arthritis pain formula Anacin Dicumarol Medipren Supac  Analgesic (Safety coated) Arthralgen Diffunasal Mefanamic Suprofen  Arthritis Strength Bufferin Dihydrocodeine Mepro Compound Suprol  Arthropan liquid Dopirydamole Methcarbomol with Aspirin Synalgos  ASA tablets/Enseals Disalcid Micrainin Tagament  Ascriptin Doan's Midol Talwin  Ascriptin A/D Dolene Mobidin Tanderil  Ascriptin Extra Strength Dolobid  Moblgesic Ticlid  Ascriptin with Codeine Doloprin or Doloprin with Codeine Momentum Tolectin  Asperbuf Duoprin Mono-gesic Trendar  Aspergum Duradyne Motrin or Motrin IB Triminicin  Aspirin plain, buffered or enteric coated Durasal Myochrisine Trigesic  Aspirin Suppositories Easprin Nalfon Trillsate  Aspirin with Codeine Ecotrin Regular or Extra Strength Naprosyn Uracel  Atromid-S Efficin Naproxen Ursinus  Auranofin Capsules Elmiron Neocylate Vanquish  Axotal Emagrin Norgesic Verin  Azathioprine Empirin or Empirin with Codeine Normiflo Vitamin E  Azolid Emprazil Nuprin Voltaren  Bayer Aspirin plain, buffered or children's or timed BC Tablets or powders  Encaprin Orgaran Warfarin Sodium  Buff-a-Comp Enoxaparin Orudis Zorpin  Buff-a-Comp with Codeine Equegesic Os-Cal-Gesic   Buffaprin Excedrin plain, buffered or Extra Strength Oxalid   Bufferin Arthritis Strength Feldene Oxphenbutazone   Bufferin plain or Extra Strength Feldene Capsules Oxycodone with Aspirin   Bufferin with Codeine Fenoprofen Fenoprofen Pabalate or Pabalate-SF   Buffets II Flogesic Panagesic   Buffinol plain or Extra Strength Florinal or Florinal with Codeine Panwarfarin   Buf-Tabs Flurbiprofen Penicillamine   Butalbital Compound Four-way cold tablets Penicillin   Butazolidin Fragmin Pepto-Bismol   Carbenicillin Geminisyn Percodan   Carna Arthritis Reliever Geopen Persantine   Carprofen Gold's salt Persistin   Chloramphenicol Goody's Phenylbutazone   Chloromycetin Haltrain Piroxlcam   Clmetidine heparin Plaquenil   Cllnoril Hyco-pap Ponstel   Clofibrate Hydroxy chloroquine Propoxyphen         Before stopping any of these medications, be sure to consult the physician who ordered them.  Some, such as Coumadin (Warfarin) are ordered to prevent or treat serious conditions such as "deep thrombosis", "pumonary embolisms", and other heart problems.  The amount of time that you may need off of the medication may also vary with  the medication and the reason for which you were taking it.  If you are taking any of these medications, please make sure you notify your pain physician before you undergo any procedures.         Pain Management Discharge Instructions  General Discharge Instructions :  If you need to reach your doctor call: Monday-Friday 8:00 am - 4:00 pm at [phone] or toll free [phone].  After clinic hours [phone] to have operator reach doctor.  Bring all of your medication bottles to all your appointments in the pain clinic.  To cancel or reschedule your appointment with Pain Management please remember to call 24 hours in advance to avoid a fee.  Refer to the educational materials which you have been given on: General Risks, I had my Procedure. Discharge Instructions, Post Sedation.  Post Procedure Instructions:  The drugs you were given will stay in your system until tomorrow, so for the next 24 hours you should not drive, make any legal decisions or drink any alcoholic beverages.  You may eat anything you prefer, but it is better to start with liquids then soups and crackers, and gradually work up to solid foods.  Please notify your doctor immediately if you have any unusual bleeding, trouble breathing or pain that is not related to your normal pain.  Depending on the type of procedure that was done, some parts of your body may feel week and/or numb.  This usually clears up by tonight or the next day.  Walk with the use of an assistive device or accompanied by an adult for the 24 hours.  You may use ice on the affected area for the first 24 hours.  Put ice in a Ziploc bag and cover with a towel and place against area 15 minutes on 15 minutes off.  You may switch to heat after 24 hours.

## 2016-02-21 NOTE — Progress Notes (Signed)
Subjective:    Patient ID: Jennifer [name], female    DOB: Apr 25, 1956, 60 y.o.   MRN: [id number]  HPI  PROCEDURE PERFORMED: Lumbosacral selective nerve root block   NOTE: The patient is a 60 y.o. female who returns to [facility] for further evaluation and treatment of pain involving the lumbar and lower extremity region. Studies consisting of MRI has revealed the patient to be with evidence of degenerative disc disease lumbar spine with degenerative changes with prior surgical intervention lumbar region, L5-S1 posterior lumbar interbody fusion, 10 x 12 mm right facet intraspinal synovial cyst, mass effect upon the right lateral thecal sac and mild medial displacement of the S1 nerve root as it abutting the foraminal portion of the L5 nerve root. L4-L5 broad-based disc bulge with moderate bilateral facet arthropathy and mild right foraminal stenosis . There is concern regarding intraspinal abnormalities contributing to the patient's symptomatology with concern regarding significant component of patient's pain being due to lumbar radiculopathy. The risks, benefits, and expectations of the procedure have been explained to the patient who was understanding and in agreement with suggested treatment plan. We will proceed with interventional treatment as discussed and as explained to the patient. The patient is understanding and in agreement with suggested treatment plan.   DESCRIPTION OF PROCEDURE: Lumbosacral selective nerve root block with IV Versed, IV fentanyl conscious sedation, EKG, blood pressure, pulse, capnography, and pulse oximetry monitoring. The procedure was performed with the patient in the prone position under fluoroscopic guidance. With the patient in the prone position, Betadine prep of proposed entry site was performed. Local anesthetic skin wheal of proposed needle entry site was prepared with 1.5% plain lidocaine with AP view of the lumbosacral spine.   PROCEDURE #1: Needle  placement at the right L 2 vertebral body: A 22 -gauge needle was inserted at the inferior border of the transverse process of the vertebral body with needle placed medial to the midline of the transverse process on AP view of the lumbosacral spine.   NEEDLE PLACEMENT AT  L3 and L4  VERTEBRAL BODY LEVELS  Needle  placement was accomplished at L3 and L4  vertebral body levels on the right side exactly as was accomplished at the L2  vertebral body level  and utilizing the same technique and under fluoroscopic guidance.  PROCEDURE #4: Needle placement at the S1 foramen. With the patient in the prone position with Betadine prep of proposed entry site accomplished, the S1 foramen was visualized under fluoroscopic guidance with AP view of the lumbosacral spine with cephalad orientation of the fluoroscope with local anesthetic skin wheal of 1.5% lidocaine of proposed needle entry site prepared. A 22-gauge needle was inserted S1 foramen under fluoroscopic guidance eliciting paresthesias radiating from the buttocks to the lower extremity after which needle was slightly withdrawn.   Needle placement was then verified on lateral view at all levels with needle tip documented to be in the posterior superior quadrant of the intervertebral foramen of  L 2, L3, and L4, and needle tip documented at the level of the S1 foramen. Following negative aspiration for heme and CSF at each level, each level was injected with 3 mL of 0.25% bupivacaine with Kenalog.  Myoneural block injections of the lumbar paraspinal musculature region Following Betadine prep of proposed entry site a 22-gauge needle was inserted into the lumbar paraspinal musculature region and following negative aspiration 2 cc of 0.25% bupivacaine with Norflex was injected for myoneural block injection of the lumbar paraspinal  musculature region 4  The patient tolerated the procedure well. A total of 10 mg of Kenalog was utilized for the procedure.   PLAN:   1. Medications: Will continue presently prescribed medications Lyrica and hydrocodone acetaminophen  2. The patient is to undergo follow-up evaluation with PCP Dr. Kym Groom  for evaluation of blood pressure and general medical condition status post procedure performed on today's visit. 3. Surgical follow-up evaluation. The patient is to proceed with neurosurgical evaluation as discussed  4. Neurological evaluation. May consider PNCV EMG studies and other studies as discussed  5. May consider radiofrequency procedures, implantation type procedures and other treatment pending response to treatment and follow-up evaluation. 6. The patient has been advise do adhere to proper body mechanics and avoid activities which may aggravate condition. 7. The patient has been advised to call the Pain Management Center prior to scheduled return appointment should there be significant change in the patient's condition or should the patient have other concerns regarding condition prior to scheduled return appointment.   Review of Systems     Objective:   Physical Exam        Assessment & Plan:

## 2016-02-22 ENCOUNTER — Telehealth: Payer: Self-pay | Admitting: *Deleted

## 2016-02-22 NOTE — Telephone Encounter (Signed)
Completed.

## 2016-02-23 ENCOUNTER — Telehealth: Payer: Self-pay

## 2016-02-23 NOTE — Telephone Encounter (Signed)
need a new rx with the correct directions for the quetiapine fumarate you put two instruction on rx which one is correct.  Send new rx with correct instruction use

## 2016-02-27 NOTE — Telephone Encounter (Signed)
received fax from pharmacy about which medications were correct.   Dr.faheem made corrections  Take 1 1/2 pill at bedtime for quetiapine fumarate 300 mg .

## 2016-02-27 NOTE — Telephone Encounter (Signed)
new directions was faxed and confirmed [facility]

## 2016-03-12 ENCOUNTER — Ambulatory Visit: Attending: Pain Medicine | Admitting: Pain Medicine

## 2016-03-12 ENCOUNTER — Encounter: Payer: Self-pay | Admitting: Pain Medicine

## 2016-03-12 VITALS — BP 157/98 | HR 88 | Temp 98.4°F | Resp 16 | Ht 63.0 in | Wt 157.0 lb

## 2016-03-12 DIAGNOSIS — M5127 Other intervertebral disc displacement, lumbosacral region: Secondary | ICD-10-CM | POA: Insufficient documentation

## 2016-03-12 DIAGNOSIS — M5126 Other intervertebral disc displacement, lumbar region: Secondary | ICD-10-CM | POA: Diagnosis not present

## 2016-03-12 DIAGNOSIS — M545 Low back pain: Secondary | ICD-10-CM | POA: Diagnosis present

## 2016-03-12 DIAGNOSIS — M5416 Radiculopathy, lumbar region: Secondary | ICD-10-CM

## 2016-03-12 DIAGNOSIS — M47816 Spondylosis without myelopathy or radiculopathy, lumbar region: Secondary | ICD-10-CM

## 2016-03-12 DIAGNOSIS — G5602 Carpal tunnel syndrome, left upper limb: Secondary | ICD-10-CM

## 2016-03-12 DIAGNOSIS — M4806 Spinal stenosis, lumbar region: Secondary | ICD-10-CM | POA: Diagnosis not present

## 2016-03-12 DIAGNOSIS — M797 Fibromyalgia: Secondary | ICD-10-CM

## 2016-03-12 DIAGNOSIS — M5116 Intervertebral disc disorders with radiculopathy, lumbar region: Secondary | ICD-10-CM | POA: Diagnosis not present

## 2016-03-12 DIAGNOSIS — M5137 Other intervertebral disc degeneration, lumbosacral region: Secondary | ICD-10-CM

## 2016-03-12 DIAGNOSIS — M79606 Pain in leg, unspecified: Secondary | ICD-10-CM | POA: Diagnosis present

## 2016-03-12 DIAGNOSIS — M533 Sacrococcygeal disorders, not elsewhere classified: Secondary | ICD-10-CM | POA: Insufficient documentation

## 2016-03-12 DIAGNOSIS — M48062 Spinal stenosis, lumbar region with neurogenic claudication: Secondary | ICD-10-CM

## 2016-03-12 DIAGNOSIS — M5010 Cervical disc disorder with radiculopathy, unspecified cervical region: Secondary | ICD-10-CM

## 2016-03-12 DIAGNOSIS — M5481 Occipital neuralgia: Secondary | ICD-10-CM

## 2016-03-12 DIAGNOSIS — Z981 Arthrodesis status: Secondary | ICD-10-CM | POA: Insufficient documentation

## 2016-03-12 DIAGNOSIS — M4726 Other spondylosis with radiculopathy, lumbar region: Secondary | ICD-10-CM | POA: Diagnosis not present

## 2016-03-12 DIAGNOSIS — M5030 Other cervical disc degeneration, unspecified cervical region: Secondary | ICD-10-CM

## 2016-03-12 DIAGNOSIS — M961 Postlaminectomy syndrome, not elsewhere classified: Secondary | ICD-10-CM

## 2016-03-12 MED ORDER — PREGABALIN 50 MG PO CAPS: ORAL_CAPSULE | ORAL | Status: DC

## 2016-03-12 MED ORDER — HYDROCODONE-ACETAMINOPHEN 10-325 MG PO TABS: ORAL_TABLET | ORAL | Status: DC

## 2016-03-12 MED ORDER — CARISOPRODOL 350 MG PO TABS: ORAL_TABLET | ORAL | Status: DC

## 2016-03-12 NOTE — Progress Notes (Signed)
Safety precautions to be maintained throughout the outpatient stay will include: orient to surroundings, keep bed in low position, maintain call bell within reach at all times, provide assistance with transfer out of bed and ambulation.  

## 2016-03-12 NOTE — Progress Notes (Signed)
Subjective:    Patient ID: Jennifer [name], female    DOB: 15-Nov-1955, 60 y.o.   MRN: [id number]  HPI   The patient is a 60 year old female who returns to pain management for further evaluation and treatment of pain involving the lower back and lower extremity region. The patient states that the pain radiates to the right lower extremity predominantly and is with left lower extremity pain of lesser degree. The patient states the pain is associated with weakness of the lower extremity is patient stated spends more time on the feet. The patient denies any recent trauma change in events of daily living the call significant change in symptomatology. The patient continues Lyrica Soma and hydrocodone acetaminophen with pain interfering with ability to engage in activities of daily living to significant degree without experiencing significant disabling pain and also interfere with patient ability to obtain restful sleep. We have discussed patient's condition and will proceed with interventional treatment at time return appointment consisting of lumbar epidural steroid injection. The patient agreed to suggested treatment plan.    Review of Systems     Objective:   Physical Exam  There was tends to palpation of the splenius capitis and occipitalis musculature region of mild degree with mild tenderness of the cervical facet cervical paraspinal musculature region. The patient was with what appeared to be bilaterally equal grip strength and Tinel and Phalen's maneuver were without increase of pain of significant degree. Palpation over the region of the acromioclavicular and glenohumeral joint regions reproduces minimal discomfort. The patient appeared to be with bilaterally equal grip strength with Tinel and Phalen's maneuver reproducing minimal discomfort as well. Palpation of the thoracic region thoracic facet region was with moderate tends to palpation of the lower thoracic paraspinal musculature region  with palpation over the lumbar paraspinal musculatures and lumbar facet region reproducing moderate to moderately severe discomfort. There was moderate tenderness of the PSIS and PII S region as well as the gluteal and piriformis musculature region with mild to moderate tenderness of the greater trochanteric region and iliotibial band region. Straight leg raise was tolerates approximately 20 with questionably increased pain with dorsiflexion noted. DTRs appeared to be trace at the knees. There was questionably decreased sensation of the L5 dermatomal distribution. There was negative clonus negative Homans. Abdomen was nontender with no costovertebral tenderness noted      Assessment & Plan:    Degenerative disc disease lumbar spine  MRI revealed degenerative changes with prior surgical intervention lumbar region, L5-S1 posterior lumbar interbody fusion, 10 x 12 mm right facet intraspinal synovial cyst, mass effect upon the right lateral thecal sac and mild medial displacement of the S1 nerve root as it abutting the foraminal portion of the L5 nerve root. L4-L5 broad-based disc bulge with moderate bilateral facet arthropathy and mild right foraminal stenosis   Lumbar stenosis with neurogenic claudication  Lumbar radiculopathy  Lumbar facet syndrome   Neuropathy  Sacroiliac joint dysfunction      PLAN   Continue present medication Lyrica Soma and hydrocodone acetaminophen No Xanax please  Lumbar epidural steroid injection to be performed at time of return appointment  F/U PCP Dr.Olmedo  for evaliation of  BP and general medical  condition  F/U surgical evaluation. Neurosurgical evaluation as planned Please ask the secretary the date of neurosurgical evaluation of lower back and lower extremity pain  TENS unit  use as discussed  Please ask S[place]oya the date of your PNCV/EMG studies of the upper extremities with Dr.  Potter  F/U neurological evaluation for PNCV/EMG studies with Dr.  Melrose Nakayama  May consider radiofrequency rhizolysis or intraspinal procedures pending response to present treatment and F/U evaluation   Patient to call Pain Management Center should patient have concerns prior to scheduled return appointment.

## 2016-03-12 NOTE — Patient Instructions (Addendum)
PLAN   Continue present medication Lyrica Soma and hydrocodone acetaminophen No Xanax please  Lumbar epidural steroid injection to be performed at time of return appointment  F/U PCP Dr.Olmedo  for evaliation of  BP and general medical  condition  F/U surgical evaluation. Neurosurgical evaluation as planned Please ask S[place]oya the date of neurosurgical evaluation of lower back and lower extremity pain  TENS unit  use as discussed  Please ask Shatoya the date of your PNCV/EMG studies of the upper extremities with Dr. Melrose Nakayama  F/U neurological evaluation for PNCV/EMG studies with Dr. Melrose Nakayama  May consider radiofrequency rhizolysis or intraspinal procedures pending response to present treatment and F/U evaluation   Patient to call Pain Management Center should patient have concerns prior to scheduled return appointment.

## 2016-03-20 ENCOUNTER — Ambulatory Visit: Attending: Pain Medicine | Admitting: Pain Medicine

## 2016-03-20 ENCOUNTER — Encounter: Payer: Self-pay | Admitting: Pain Medicine

## 2016-03-20 VITALS — BP 153/92 | HR 78 | Temp 97.8°F | Resp 16 | Ht 63.0 in | Wt 157.0 lb

## 2016-03-20 DIAGNOSIS — M797 Fibromyalgia: Secondary | ICD-10-CM

## 2016-03-20 DIAGNOSIS — M5416 Radiculopathy, lumbar region: Secondary | ICD-10-CM

## 2016-03-20 DIAGNOSIS — M51379 Other intervertebral disc degeneration, lumbosacral region without mention of lumbar back pain or lower extremity pain: Secondary | ICD-10-CM

## 2016-03-20 DIAGNOSIS — M1288 Other specific arthropathies, not elsewhere classified, other specified site: Secondary | ICD-10-CM | POA: Diagnosis not present

## 2016-03-20 DIAGNOSIS — M5136 Other intervertebral disc degeneration, lumbar region: Secondary | ICD-10-CM | POA: Insufficient documentation

## 2016-03-20 DIAGNOSIS — M5010 Cervical disc disorder with radiculopathy, unspecified cervical region: Secondary | ICD-10-CM

## 2016-03-20 DIAGNOSIS — M47816 Spondylosis without myelopathy or radiculopathy, lumbar region: Secondary | ICD-10-CM

## 2016-03-20 DIAGNOSIS — M48062 Spinal stenosis, lumbar region with neurogenic claudication: Secondary | ICD-10-CM

## 2016-03-20 DIAGNOSIS — M7138 Other bursal cyst, other site: Secondary | ICD-10-CM | POA: Diagnosis not present

## 2016-03-20 DIAGNOSIS — Z9889 Other specified postprocedural states: Secondary | ICD-10-CM | POA: Insufficient documentation

## 2016-03-20 DIAGNOSIS — M545 Low back pain: Secondary | ICD-10-CM | POA: Diagnosis present

## 2016-03-20 DIAGNOSIS — M961 Postlaminectomy syndrome, not elsewhere classified: Secondary | ICD-10-CM

## 2016-03-20 DIAGNOSIS — Z981 Arthrodesis status: Secondary | ICD-10-CM | POA: Diagnosis not present

## 2016-03-20 DIAGNOSIS — G5602 Carpal tunnel syndrome, left upper limb: Secondary | ICD-10-CM

## 2016-03-20 DIAGNOSIS — M5481 Occipital neuralgia: Secondary | ICD-10-CM

## 2016-03-20 DIAGNOSIS — M5126 Other intervertebral disc displacement, lumbar region: Secondary | ICD-10-CM | POA: Insufficient documentation

## 2016-03-20 DIAGNOSIS — M5030 Other cervical disc degeneration, unspecified cervical region: Secondary | ICD-10-CM

## 2016-03-20 DIAGNOSIS — M5137 Other intervertebral disc degeneration, lumbosacral region: Secondary | ICD-10-CM

## 2016-03-20 DIAGNOSIS — M79606 Pain in leg, unspecified: Secondary | ICD-10-CM | POA: Diagnosis present

## 2016-03-20 MED ORDER — FENTANYL CITRATE (PF) 100 MCG/2ML IJ SOLN
INTRAMUSCULAR | Status: AC
  Administered 2016-03-20: 100 ug via INTRAVENOUS
  Filled 2016-03-20: qty 2

## 2016-03-20 MED ORDER — CEFAZOLIN SODIUM 1 G IJ SOLR
INTRAMUSCULAR | Status: AC
  Administered 2016-03-20: 1 g
  Filled 2016-03-20: qty 10

## 2016-03-20 MED ORDER — LIDOCAINE HCL (PF) 1 % IJ SOLN
INTRAMUSCULAR | Status: AC
  Administered 2016-03-20: 09:00:00
  Filled 2016-03-20: qty 5

## 2016-03-20 MED ORDER — MIDAZOLAM HCL 5 MG/5ML IJ SOLN
INTRAMUSCULAR | Status: AC
  Administered 2016-03-20: 4 mg via INTRAVENOUS
  Filled 2016-03-20: qty 5

## 2016-03-20 MED ORDER — SODIUM CHLORIDE 0.9 % IJ SOLN
INTRAMUSCULAR | Status: AC
  Administered 2016-03-20: 09:00:00
  Filled 2016-03-20: qty 10

## 2016-03-20 MED ORDER — TRIAMCINOLONE ACETONIDE 40 MG/ML IJ SUSP
INTRAMUSCULAR | Status: AC
  Administered 2016-03-20: 09:00:00
  Filled 2016-03-20: qty 1

## 2016-03-20 MED ORDER — BUPIVACAINE HCL (PF) 0.25 % IJ SOLN
INTRAMUSCULAR | Status: AC
  Administered 2016-03-20: 09:00:00
  Filled 2016-03-20: qty 30

## 2016-03-20 MED ORDER — ORPHENADRINE CITRATE 30 MG/ML IJ SOLN
INTRAMUSCULAR | Status: AC
  Administered 2016-03-20: 09:00:00
  Filled 2016-03-20: qty 2

## 2016-03-20 NOTE — Progress Notes (Signed)
   Subjective:    Patient ID: Jennifer [name], female    DOB: Mar 21, 1956, 60 y.o.   MRN: [id number]  HPI  PROCEDURE PERFORMED: Lumbar epidural steroid injection   NOTE: The patient is a 60 y.o. female who returns to [facility] for further evaluation and treatment of pain involving the lumbar and lower extremity region. MRI revealed the patient to be with degenerative disc disease lumbar spine  MRI revealed degenerative changes with prior surgical intervention lumbar region, L5-S1 posterior lumbar interbody fusion, 10 x 12 mm right facet intraspinal synovial cyst, mass effect upon the right lateral thecal sac and mild medial displacement of the S1 nerve root as it abutting the foraminal portion of the L5 nerve root. L4-L5 broad-based disc bulge with moderate bilateral facet arthropathy and mild right foraminal stenosis . There is concern regarding intraspinal abnormalities continue to patient's symptomatology with concern regarding lumbar stenosis with neurogenic claudication as well as lumbar radiculopathy. The risks, benefits, and expectations of the procedure have been discussed and explained to the patient who was understanding and in agreement with suggested treatment plan. We will proceed with lumbar epidural steroid injection as discussed and as explained to the patient who is willing to proceed with procedure as planned.   DESCRIPTION OF PROCEDURE: Lumbar epidural steroid injection with IV Versed, IV fentanyl conscious sedation, EKG, blood pressure, pulse, capnography, and pulse oximetry monitoring. The procedure was performed with the patient in the prone position under fluoroscopic guidance. A local anesthetic skin wheal of 1.5% plain lidocaine was accomplished at proposed entry site. An 18-gauge Tuohy epidural needle was inserted at the L 4 vertebral body level right of the midline via loss-of-resistance technique with negative heme and negative CSF return. A total of 4 mL of  Preservative-Free normal saline with 40 mg of Kenalog injected incrementally via epidurally placed needle. Needle was removed.  Myoneural block injections of the lumbar paraspinal musculature region Following Betadine prep of proposed entry site a 22-gauge needle was inserted into the lumbar paraspinal musculature region and following negative aspiration 2 cc of 0.25% bupivacaine with Norflex was injected for myoneural block injection of the lumbar paraspinal musculature region 4  A total of 40 mg of Kenalog was utilized for the procedure.   The patient tolerated the injection well.    PLAN:   1. Medications: We will continue presently prescribed medications Lyrica and hydrocodone acetaminophen 2. Will consider modification of treatment regimen pending response to treatment rendered on today's visit and follow-up evaluation. 3. The patient is to follow-up with primary care physician Dr.Olmedo  regarding blood pressure and general medical condition status post lumbar epidural steroid injection performed on today's visit. 4. Surgical evaluation as discussed Patient will undergo further surgical evaluation as discussed  5. Neurological evaluation.May consider PNCV EMG studies and other studies  6. The patient may be a candidate for radiofrequency procedures, implantation device, and other treatment pending response to treatment and follow-up evaluation. 7. The patient has been advised to adhere to proper body mechanics and avoid activities which appear to aggravate condition. 8. The patient has been advised to call the [facility] Management Center prior to scheduled return appointment should there be significant change in condition or should there be sign  The patient is understanding and agrees with the suggested  treatment plan   Review of Systems     Objective:   Physical Exam        Assessment & Plan:

## 2016-03-20 NOTE — Progress Notes (Signed)
Safety precautions to be maintained throughout the outpatient stay will include: orient to surroundings, keep bed in low position, maintain call bell within reach at all times, provide assistance with transfer out of bed and ambulation.  

## 2016-03-20 NOTE — Patient Instructions (Signed)
GENERAL RISKS AND COMPLICATIONS  What are the risk, side effects and possible complications? Generally speaking, most procedures are safe.  However, with any procedure there are risks, side effects, and the possibility of complications.  The risks and complications are dependent upon the sites that are lesioned, or the type of nerve block to be performed.  The closer the procedure is to the spine, the more serious the risks are.  Great care is taken when placing the radio frequency needles, block needles or lesioning probes, but sometimes complications can occur. 1. Infection: Any time there is an injection through the skin, there is a risk of infection.  This is why sterile conditions are used for these blocks.  There are four possible types of infection. 1. Localized skin infection. 2. Central Nervous System Infection-This can be in the form of Meningitis, which can be deadly. 3. Epidural Infections-This can be in the form of an epidural abscess, which can cause pressure inside of the spine, causing compression of the spinal cord with subsequent paralysis. This would require an emergency surgery to decompress, and there are no guarantees that the patient would recover from the paralysis. 4. Discitis-This is an infection of the intervertebral discs.  It occurs in about 1% of discography procedures.  It is difficult to treat and it may lead to surgery.        2. Pain: the needles have to go through skin and soft tissues, will cause soreness.       3. Damage to internal structures:  The nerves to be lesioned may be near blood vessels or    other nerves which can be potentially damaged.       4. Bleeding: Bleeding is more common if the patient is taking blood thinners such as  aspirin, Coumadin, Ticiid, Plavix, etc., or if he/she have some genetic predisposition  such as hemophilia. Bleeding into the spinal canal can cause compression of the spinal  cord with subsequent paralysis.  This would require an  emergency surgery to  decompress and there are no guarantees that the patient would recover from the  paralysis.       5. Pneumothorax:  Puncturing of a lung is a possibility, every time a needle is introduced in  the area of the chest or upper back.  Pneumothorax refers to free air around the  collapsed lung(s), inside of the thoracic cavity (chest cavity).  Another two possible  complications related to a similar event would include: Hemothorax and Chylothorax.   These are variations of the Pneumothorax, where instead of air around the collapsed  lung(s), you may have blood or chyle, respectively.       6. Spinal headaches: They may occur with any procedures in the area of the spine.       7. Persistent CSF (Cerebro-Spinal Fluid) leakage: This is a rare problem, but may occur  with prolonged intrathecal or epidural catheters either due to the formation of a fistulous  track or a dural tear.       8. Nerve damage: By working so close to the spinal cord, there is always a possibility of  nerve damage, which could be as serious as a permanent spinal cord injury with  paralysis.       9. Death:  Although rare, severe deadly allergic reactions known as "Anaphylactic  reaction" can occur to any of the medications used.      10. Worsening of the symptoms:  We can always make thing worse.    What are the chances of something like this happening? Chances of any of this occuring are extremely low.  By statistics, you have more of a chance of getting killed in a motor vehicle accident: while driving to the hospital than any of the above occurring .  Nevertheless, you should be aware that they are possibilities.  In general, it is similar to taking a shower.  Everybody knows that you can slip, hit your head and get killed.  Does that mean that you should not shower again?  Nevertheless always keep in mind that statistics do not mean anything if you happen to be on the wrong side of them.  Even if a procedure has a 1  (one) in a 1,000,000 (million) chance of going wrong, it you happen to be that one..Also, keep in mind that by statistics, you have more of a chance of having something go wrong when taking medications.  Who should not have this procedure? If you are on a blood thinning medication (e.g. Coumadin, Plavix, see list of "Blood Thinners"), or if you have an active infection going on, you should not have the procedure.  If you are taking any blood thinners, please inform your physician.  How should I prepare for this procedure?  Do not eat or drink anything at least six hours prior to the procedure.  Bring a driver with you .  It cannot be a taxi.  Come accompanied by an adult that can drive you back, and that is strong enough to help you if your legs get weak or numb from the local anesthetic.  Take all of your medicines the morning of the procedure with just enough water to swallow them.  If you have diabetes, make sure that you are scheduled to have your procedure done first thing in the morning, whenever possible.  If you have diabetes, take only half of your insulin dose and notify our nurse that you have done so as soon as you arrive at the clinic.  If you are diabetic, but only take blood sugar pills (oral hypoglycemic), then do not take them on the morning of your procedure.  You may take them after you have had the procedure.  Do not take aspirin or any aspirin-containing medications, at least eleven (11) days prior to the procedure.  They may prolong bleeding.  Wear loose fitting clothing that may be easy to take off and that you would not mind if it got stained with Betadine or blood.  Do not wear any jewelry or perfume  Remove any nail coloring.  It will interfere with some of our monitoring equipment.  NOTE: Remember that this is not meant to be interpreted as a complete list of all possible complications.  Unforeseen problems may occur.  BLOOD THINNERS The following drugs  contain aspirin or other products, which can cause increased bleeding during surgery and should not be taken for 2 weeks prior to and 1 week after surgery.  If you should need take something for relief of minor pain, you may take acetaminophen which is found in Tylenol,m Datril, Anacin-3 and Panadol. It is not blood thinner. The products listed below are.  Do not take any of the products listed below in addition to any listed on your instruction sheet.  A.P.C or A.P.C with Codeine Codeine Phosphate Capsules #3 Ibuprofen Ridaura  ABC compound Congesprin Imuran rimadil  Advil Cope Indocin Robaxisal  Alka-Seltzer Effervescent Pain Reliever and Antacid Coricidin or Coricidin-D  Indomethacin Rufen    Alka-Seltzer plus Cold Medicine Cosprin Ketoprofen S-A-C Tablets  Anacin Analgesic Tablets or Capsules Coumadin Korlgesic Salflex  Anacin Extra Strength Analgesic tablets or capsules CP-2 Tablets Lanoril Salicylate  Anaprox Cuprimine Capsules Levenox Salocol  Anexsia-D Dalteparin Magan Salsalate  Anodynos Darvon compound Magnesium Salicylate Sine-off  Ansaid Dasin Capsules Magsal Sodium Salicylate  Anturane Depen Capsules Marnal Soma  APF Arthritis pain formula Dewitt's Pills Measurin Stanback  Argesic Dia-Gesic Meclofenamic Sulfinpyrazone  Arthritis Bayer Timed Release Aspirin Diclofenac Meclomen Sulindac  Arthritis pain formula Anacin Dicumarol Medipren Supac  Analgesic (Safety coated) Arthralgen Diffunasal Mefanamic Suprofen  Arthritis Strength Bufferin Dihydrocodeine Mepro Compound Suprol  Arthropan liquid Dopirydamole Methcarbomol with Aspirin Synalgos  ASA tablets/Enseals Disalcid Micrainin Tagament  Ascriptin Doan's Midol Talwin  Ascriptin A/D Dolene Mobidin Tanderil  Ascriptin Extra Strength Dolobid Moblgesic Ticlid  Ascriptin with Codeine Doloprin or Doloprin with Codeine Momentum Tolectin  Asperbuf Duoprin Mono-gesic Trendar  Aspergum Duradyne Motrin or Motrin IB Triminicin  Aspirin  plain, buffered or enteric coated Durasal Myochrisine Trigesic  Aspirin Suppositories Easprin Nalfon Trillsate  Aspirin with Codeine Ecotrin Regular or Extra Strength Naprosyn Uracel  Atromid-S Efficin Naproxen Ursinus  Auranofin Capsules Elmiron Neocylate Vanquish  Axotal Emagrin Norgesic Verin  Azathioprine Empirin or Empirin with Codeine Normiflo Vitamin E  Azolid Emprazil Nuprin Voltaren  Bayer Aspirin plain, buffered or children's or timed BC Tablets or powders Encaprin Orgaran Warfarin Sodium  Buff-a-Comp Enoxaparin Orudis Zorpin  Buff-a-Comp with Codeine Equegesic Os-Cal-Gesic   Buffaprin Excedrin plain, buffered or Extra Strength Oxalid   Bufferin Arthritis Strength Feldene Oxphenbutazone   Bufferin plain or Extra Strength Feldene Capsules Oxycodone with Aspirin   Bufferin with Codeine Fenoprofen Fenoprofen Pabalate or Pabalate-SF   Buffets II Flogesic Panagesic   Buffinol plain or Extra Strength Florinal or Florinal with Codeine Panwarfarin   Buf-Tabs Flurbiprofen Penicillamine   Butalbital Compound Four-way cold tablets Penicillin   Butazolidin Fragmin Pepto-Bismol   Carbenicillin Geminisyn Percodan   Carna Arthritis Reliever Geopen Persantine   Carprofen Gold's salt Persistin   Chloramphenicol Goody's Phenylbutazone   Chloromycetin Haltrain Piroxlcam   Clmetidine heparin Plaquenil   Cllnoril Hyco-pap Ponstel   Clofibrate Hydroxy chloroquine Propoxyphen         Before stopping any of these medications, be sure to consult the physician who ordered them.  Some, such as Coumadin (Warfarin) are ordered to prevent or treat serious conditions such as "deep thrombosis", "pumonary embolisms", and other heart problems.  The amount of time that you may need off of the medication may also vary with the medication and the reason for which you were taking it.  If you are taking any of these medications, please make sure you notify your pain physician before you undergo any  procedures.         Pain Management Discharge Instructions  General Discharge Instructions :  If you need to reach your doctor call: Monday-Friday 8:00 am - 4:00 pm at [phone] or toll free [phone].  After clinic hours [phone] to have operator reach doctor.  Bring all of your medication bottles to all your appointments in the pain clinic.  To cancel or reschedule your appointment with Pain Management please remember to call 24 hours in advance to avoid a fee.  Refer to the educational materials which you have been given on: General Risks, I had my Procedure. Discharge Instructions, Post Sedation.  Post Procedure Instructions:  The drugs you were given will stay in your system until tomorrow, so for the next 24 hours you should   not drive, make any legal decisions or drink any alcoholic beverages.  You may eat anything you prefer, but it is better to start with liquids then soups and crackers, and gradually work up to solid foods.  Please notify your doctor immediately if you have any unusual bleeding, trouble breathing or pain that is not related to your normal pain.  Depending on the type of procedure that was done, some parts of your body may feel week and/or numb.  This usually clears up by tonight or the next day.  Walk with the use of an assistive device or accompanied by an adult for the 24 hours.  You may use ice on the affected area for the first 24 hours.  Put ice in a Ziploc bag and cover with a towel and place against area 15 minutes on 15 minutes off.  You may switch to heat after 24 hours.

## 2016-03-21 ENCOUNTER — Telehealth: Payer: Self-pay | Admitting: *Deleted

## 2016-03-21 NOTE — Telephone Encounter (Signed)
Pt would like for someone to give her a call about her meds...thanks

## 2016-03-21 NOTE — Telephone Encounter (Signed)
Left voicemail for patient to call our office if there are questions or concerns re; procedure on yesterday.  

## 2016-03-21 NOTE — Telephone Encounter (Signed)
Jennifer [name]  Please schedule patient for the date that she has requested which is 04/10/2016. Thank you

## 2016-03-21 NOTE — Telephone Encounter (Signed)
Jennifer [name] has called and states her medication management appt has been scheduled for  04/15/16 which is per her meds and orders according to her med refill date.  However, Jennifer [name] states that her last fill was on 03/12/16 and she will run out of medication.  She also has has to be in [place]  to take care of family matters with daughter on 04/11/16 x 1 week.  She would like to be seen on 04/10/16 if possible.  Told her I would relay the message.

## 2016-03-22 ENCOUNTER — Telehealth: Payer: Self-pay | Admitting: *Deleted

## 2016-03-22 NOTE — Telephone Encounter (Signed)
Jennifer [name] states she will schedule the patient as directed by Dr Primus Bravo.

## 2016-03-22 NOTE — Telephone Encounter (Signed)
sw pt per Dr. Primus Bravo per Cecille Rubin to place pt on 04/10/16. Gave appt for 04/10/16 @ 7:30am. Pt is aware...td

## 2016-04-02 ENCOUNTER — Telehealth: Payer: Self-pay

## 2016-04-02 NOTE — Telephone Encounter (Signed)
Called pt and let her know about Dr. Primus Bravo not being here on a date she is is suppose to come in 6/14. Offered her to come in on the 19th and pt says there is no way possible for her to do so. Dr. Primus Bravo is already at his max of 25 pts this week on each day. Some days he has more. How can we help this pt get her meds?

## 2016-04-02 NOTE — Telephone Encounter (Signed)
Rozanna Box and Shatoya  Please schedule patient for 7 AM appointment for tomorrow Wednesday, 04/03/2016

## 2016-04-03 ENCOUNTER — Ambulatory Visit: Attending: Pain Medicine | Admitting: Pain Medicine

## 2016-04-03 ENCOUNTER — Encounter: Payer: Self-pay | Admitting: Pain Medicine

## 2016-04-03 VITALS — BP 120/91 | HR 108 | Temp 97.8°F | Resp 16 | Ht 63.0 in | Wt 156.0 lb

## 2016-04-03 DIAGNOSIS — M533 Sacrococcygeal disorders, not elsewhere classified: Secondary | ICD-10-CM | POA: Insufficient documentation

## 2016-04-03 DIAGNOSIS — M5116 Intervertebral disc disorders with radiculopathy, lumbar region: Secondary | ICD-10-CM | POA: Insufficient documentation

## 2016-04-03 DIAGNOSIS — M47816 Spondylosis without myelopathy or radiculopathy, lumbar region: Secondary | ICD-10-CM

## 2016-04-03 DIAGNOSIS — G5602 Carpal tunnel syndrome, left upper limb: Secondary | ICD-10-CM

## 2016-04-03 DIAGNOSIS — M5416 Radiculopathy, lumbar region: Secondary | ICD-10-CM

## 2016-04-03 DIAGNOSIS — M5030 Other cervical disc degeneration, unspecified cervical region: Secondary | ICD-10-CM

## 2016-04-03 DIAGNOSIS — M47896 Other spondylosis, lumbar region: Secondary | ICD-10-CM | POA: Diagnosis not present

## 2016-04-03 DIAGNOSIS — M5126 Other intervertebral disc displacement, lumbar region: Secondary | ICD-10-CM | POA: Insufficient documentation

## 2016-04-03 DIAGNOSIS — G629 Polyneuropathy, unspecified: Secondary | ICD-10-CM | POA: Insufficient documentation

## 2016-04-03 DIAGNOSIS — M797 Fibromyalgia: Secondary | ICD-10-CM

## 2016-04-03 DIAGNOSIS — M5137 Other intervertebral disc degeneration, lumbosacral region: Secondary | ICD-10-CM

## 2016-04-03 DIAGNOSIS — M5481 Occipital neuralgia: Secondary | ICD-10-CM

## 2016-04-03 DIAGNOSIS — M79606 Pain in leg, unspecified: Secondary | ICD-10-CM | POA: Diagnosis present

## 2016-04-03 DIAGNOSIS — Z981 Arthrodesis status: Secondary | ICD-10-CM | POA: Insufficient documentation

## 2016-04-03 DIAGNOSIS — M4806 Spinal stenosis, lumbar region: Secondary | ICD-10-CM | POA: Diagnosis not present

## 2016-04-03 DIAGNOSIS — F3132 Bipolar disorder, current episode depressed, moderate: Secondary | ICD-10-CM

## 2016-04-03 DIAGNOSIS — M48062 Spinal stenosis, lumbar region with neurogenic claudication: Secondary | ICD-10-CM

## 2016-04-03 DIAGNOSIS — M961 Postlaminectomy syndrome, not elsewhere classified: Secondary | ICD-10-CM

## 2016-04-03 DIAGNOSIS — M5010 Cervical disc disorder with radiculopathy, unspecified cervical region: Secondary | ICD-10-CM

## 2016-04-03 DIAGNOSIS — M545 Low back pain: Secondary | ICD-10-CM | POA: Diagnosis present

## 2016-04-03 MED ORDER — HYDROCODONE-ACETAMINOPHEN 10-325 MG PO TABS: ORAL_TABLET | ORAL | Status: DC

## 2016-04-03 MED ORDER — CARISOPRODOL 350 MG PO TABS: ORAL_TABLET | ORAL | Status: DC

## 2016-04-03 NOTE — Progress Notes (Signed)
Subjective:    Patient ID: Jennifer [name], female    DOB: 1956/08/01, 59 y.o.   MRN: [id number]  HPI  The patient is a 60 year old female who returns to pain management for further evaluation and treatment of pain involving the region of the lower back and lower extremity region with pain involving the neck and upper extremity region of lesser degree. The patient states that the lower back pain radiation the right lower extremity predominantly. The patient's pain is aggravated with standing walking and becomes more intense as the day progresses and is patient spends more time on the feet. The patient is to undergo further neurosurgical evaluation as discussed. We discussed patient's condition and will continue Lyrica Soma and hydrocodone acetaminophen at this time. We will proceed with lumbosacral selective nerve root block to be performed at time return appointment. Discussed additional treatment modifications and patient is to undergo neurosurgical reevaluation as discussed and we will consider additional modifications of treatment regimen pending disposition of neurosurgeon and decision the patient. All agreed to suggested treatment plan. We will proceed with lumbosacral selective nerve root block to be performed at time return appointment.  Review of Systems     Objective:   Physical Exam   There was tenderness to palpation of the splenius capitis and occipitalis musculature regions palpation which reproduces mild to moderate discomfort with mild to moderate tenderness of the acromioclavicular and glenohumeral joint regions. The patient appeared to be with slightly decreased grip strength with Tinel and Phalen's maneuver reproducing minimal discomfort. There was tenderness over the thoracic region thoracic facet region with moderate spasms noted in the thoracic paraspinal musculature region with no crepitus of the thoracic region noted. Palpation over the lumbar paraspinal muscles region  lumbar facet region was attends to palpation of moderate degree with lateral bending rotation extension and palpation over the lumbar facets reproducing moderate discomfort. There was moderate tenderness to palpation of the PSIS and PII S region right greater than left. Straight leg raising was tolerates approximately 20 on the right and to 30 on the left. EHL strength appeared to be questionable decreased. There was no definite sensory deficit or dermatomal distribution detected. There appeared to be negative clonus negative Homans. DTRs appeared to be trace at the knees.Abdomen nontender with no costovertebral tenderness noted         Assessment & Plan:      Degenerative disc disease lumbar spine  MRI revealed degenerative changes with prior surgical intervention lumbar region, L5-S1 posterior lumbar interbody fusion, 10 x 12 mm right facet intraspinal synovial cyst, mass effect upon the right lateral thecal sac and mild medial displacement of the S1 nerve root as it abutting the foraminal portion of the L5 nerve root. L4-L5 broad-based disc bulge with moderate bilateral facet arthropathy and mild right foraminal stenosis   Lumbar stenosis with neurogenic claudication  Lumbar radiculopathy  Lumbar facet syndrome   Neuropathy  Sacroiliac joint dysfunction     PLAN   Continue present medication Lyrica Soma and hydrocodone acetaminophen No Xanax please  Lumbosacral selective nerve root block to be performed at time return appointment  F/U PCP Dr.Olmedo  for evaliation of  BP and general medical  condition  F/U surgical evaluation. Neurosurgical evaluation as planned Please ask Jennifer [name] the date of neurosurgical evaluation of lower back and lower extremity pain  TENS unit  use as discussed  Please ask Jennifer [name] the date of your PNCV/EMG studies of the upper extremities with Dr. Melrose Nakayama  F/U neurological  evaluation for PNCV/EMG studies with Dr. Melrose Nakayama as discussed  May  consider radiofrequency rhizolysis or intraspinal procedures pending response to present treatment and F/U evaluation   Patient to call Pain Management Center should patient have concerns prior to scheduled return appointment.

## 2016-04-03 NOTE — Progress Notes (Signed)
Jennifer [name] here  For medication management. Jennifer [name] states that Dr Primus Bravo gave her a different type of pain medication to try and she tried 2 of them yesterday.  I can't find any indication of different pain medication on chart other than the Norco 10-325 mg.  Safety precautions to be maintained throughout the outpatient stay will include: orient to surroundings, keep bed in low position, maintain call bell within reach at all times, provide assistance with transfer out of bed and ambulation.

## 2016-04-03 NOTE — Patient Instructions (Addendum)
PLAN   Continue present medication Lyrica Soma and hydrocodone acetaminophen No Xanax please  Lumbosacral selective nerve root block to be performed at time return appointment  F/U PCP Dr.Olmedo  for evaliation of  BP and general medical  condition  F/U surgical evaluation. Neurosurgical evaluation as planned Please ask Shatoya the date of neurosurgical evaluation of lower back and lower extremity pain  TENS unit  use as discussed  Please ask Shatoya the date of your PNCV/EMG studies of the upper extremities with Dr. Melrose Nakayama  F/U neurological evaluation for PNCV/EMG studies with Dr. Melrose Nakayama as discussed  May consider radiofrequency rhizolysis or intraspinal procedures pending response to present treatment and F/U evaluation   Patient to call Pain Management Center should patient have concerns prior to scheduled return appointment.Selective Nerve Root Block Patient Information  Description: Specific nerve roots exit the spinal canal and these nerves can be compressed and inflamed by a bulging disc and bone spurs.  By injecting steroids on the nerve root, we can potentially decrease the inflammation surrounding these nerves, which often leads to decreased pain.  Also, by injecting local anesthesia on the nerve root, this can provide [place] helpful information to give to your referring doctor if it decreases your pain.  Selective nerve root blocks can be done along the spine from the neck to the low back depending on the location of your pain.   After numbing the skin with local anesthesia, a small needle is passed to the nerve root and the position of the needle is verified using x-ray pictures.  After the needle is in correct position, we then deposit the medication.  You may experience a pressure sensation while this is being done.  The entire block usually lasts less than 15 minutes.  Conditions that may be treated with selective nerve root blocks:  Low back and leg pain  Spinal  stenosis  Diagnostic block prior to potential surgery  Neck and arm pain  Post laminectomy syndrome  Preparation for the injection:  1. Do not eat any solid food or dairy products within 8 hours of your appointment. 2. You may drink clear liquids up to 3 hours before an appointment.  Clear liquids include water, black coffee, juice or soda.  No milk or cream please. 3. You may take your regular medications, including pain medications, with a sip of water before your appointment.  Diabetics should hold regular insulin (if taken separately) and take 1/2 normal NPH dose the morning of the procedure.  Carry some sugar containing items with you to your appointment. 4. A driver must accompany you and be prepared to drive you home after your procedure. 5. Bring all your current medications with you. 6. An IV may be inserted and sedation may be given at the discretion of the physician. 7. A blood pressure cuff, EKG, and other monitors will often be applied during the procedure.  Some patients may need to have extra oxygen administered for a short period. 8. You will be asked to provide medical information, including allergies, prior to the procedure.  We must know immediately if you are taking blood  Thinners (like Coumadin) or if you are allergic to IV iodine contrast (dye).  Possible side-effects: All are usually temporary  Bleeding from needle site  Light headedness  Numbness and tingling  Decreased blood pressure  Weakness in arms/legs  Pressure sensation in back/neck  Pain at injection site (several days)  Possible complications: All are extremely rare  Infection  Nerve injury  Spinal headache (a headache wore with upright position)  Call if you experience:  Fever/chills associated with headache or increased back/neck pain  Headache worsened by an upright position  New onset weakness or numbness of an extremity below the injection site  Hives or difficulty  breathing (go to the emergency room)  Inflammation or drainage at the injection site(s)  Severe back/neck pain greater than usual  New symptoms which are concerning to you  Please note:  Although the local anesthetic injected can often make your back or neck feel good for several hours after the injection the pain will likely return.  It takes 3-5 days for steroids to work on the nerve root. You may not notice any pain relief for at least one week.  If effective, we will often do a series of 3 injections spaced 3-6 weeks apart to maximally decrease your pain.    If you have any questions, please call [phone] [facility] Clinic[facility] R[facility] AND COMPLICATIONS  What are the risk, side effects and possible complications? Generally speaking, most procedures are safe.  However, with any procedure there are risks, side effects, and the possibility of complications.  The risks and complications are dependent upon the sites that are lesioned, or the type of nerve block to be performed.  The closer the procedure is to the spine, the more serious the risks are.  Great care is taken when placing the radio frequency needles, block needles or lesioning probes, but sometimes complications can occur. 1. Infection: Any time there is an injection through the skin, there is a risk of infection.  This is why sterile conditions are used for these blocks.  There are four possible types of infection. 1. Localized skin infection. 2. Central Nervous System Infection-This can be in the form of Meningitis, which can be deadly. 3. Epidural Infections-This can be in the form of an epidural abscess, which can cause pressure inside of the spine, causing compression of the spinal cord with subsequent paralysis. This would require an emergency surgery to decompress, and there are no guarantees that the patient would recover from the paralysis. 4. Discitis-This is an infection of the  intervertebral discs.  It occurs in about 1% of discography procedures.  It is difficult to treat and it may lead to surgery.        2. Pain: the needles have to go through skin and soft tissues, will cause soreness.       3. Damage to internal structures:  The nerves to be lesioned may be near blood vessels or    other nerves which can be potentially damaged.       4. Bleeding: Bleeding is more common if the patient is taking blood thinners such as  aspirin, Coumadin, Ticiid, Plavix, etc., or if he/she have some genetic predisposition  such as hemophilia. Bleeding into the spinal canal can cause compression of the spinal  cord with subsequent paralysis.  This would require an emergency surgery to  decompress and there are no guarantees that the patient would recover from the  paralysis.       5. Pneumothorax:  Puncturing of a lung is a possibility, every time a needle is introduced in  the area of the chest or upper back.  Pneumothorax refers to free air around the  collapsed lung(s), inside of the thoracic cavity (chest cavity).  Another two possible  complications related to a similar event would include: Hemothorax and Chylothorax.   These are  variations of the Pneumothorax, where instead of air around the collapsed  lung(s), you may have blood or chyle, respectively.       6. Spinal headaches: They may occur with any procedures in the area of the spine.       7. Persistent CSF (Cerebro-Spinal Fluid) leakage: This is a rare problem, but may occur  with prolonged intrathecal or epidural catheters either due to the formation of a fistulous  track or a dural tear.       8. Nerve damage: By working so close to the spinal cord, there is always a possibility of  nerve damage, which could be as serious as a permanent spinal cord injury with  paralysis.       9. Death:  Although rare, severe deadly allergic reactions known as "Anaphylactic  reaction" can occur to any of the medications used.       10. Worsening of the symptoms:  We can always make thing worse.  What are the chances of something like this happening? Chances of any of this occuring are extremely low.  By statistics, you have more of a chance of getting killed in a motor vehicle accident: while driving to the hospital than any of the above occurring .  Nevertheless, you should be aware that they are possibilities.  In general, it is similar to taking a shower.  Everybody knows that you can slip, hit your head and get killed.  Does that mean that you should not shower again?  Nevertheless always keep in mind that statistics do not mean anything if you happen to be on the wrong side of them.  Even if a procedure has a 1 (one) in a 1,000,000 (million) chance of going wrong, it you happen to be that one..Also, keep in mind that by statistics, you have more of a chance of having something go wrong when taking medications.  Who should not have this procedure? If you are on a blood thinning medication (e.g. Coumadin, Plavix, see list of "Blood Thinners"), or if you have an active infection going on, you should not have the procedure.  If you are taking any blood thinners, please inform your physician.  How should I prepare for this procedure?  Do not eat or drink anything at least six hours prior to the procedure.  Bring a driver with you .  It cannot be a taxi.  Come accompanied by an adult that can drive you back, and that is strong enough to help you if your legs get weak or numb from the local anesthetic.  Take all of your medicines the morning of the procedure with just enough water to swallow them.  If you have diabetes, make sure that you are scheduled to have your procedure done first thing in the morning, whenever possible.  If you have diabetes, take only half of your insulin dose and notify our nurse that you have done so as soon as you arrive at the clinic.  If you are diabetic, but only take blood sugar pills (oral  hypoglycemic), then do not take them on the morning of your procedure.  You may take them after you have had the procedure.  Do not take aspirin or any aspirin-containing medications, at least eleven (11) days prior to the procedure.  They may prolong bleeding.  Wear loose fitting clothing that may be easy to take off and that you would not mind if it got stained with Betadine or blood.  Do not  wear any jewelry or perfume  Remove any nail coloring.  It will interfere with some of our monitoring equipment.  NOTE: Remember that this is not meant to be interpreted as a complete list of all possible complications.  Unforeseen problems may occur.  BLOOD THINNERS The following drugs contain aspirin or other products, which can cause increased bleeding during surgery and should not be taken for 2 weeks prior to and 1 week after surgery.  If you should need take something for relief of minor pain, you may take acetaminophen which is found in Tylenol,m Datril, Anacin-3 and Panadol. It is not blood thinner. The products listed below are.  Do not take any of the products listed below in addition to any listed on your instruction sheet.  A.P.C or A.P.C with Codeine Codeine Phosphate Capsules #3 Ibuprofen Ridaura  ABC compound Congesprin Imuran rimadil  Advil Cope Indocin Robaxisal  Alka-Seltzer Effervescent Pain Reliever and Antacid Coricidin or Coricidin-D  Indomethacin Rufen  Alka-Seltzer plus Cold Medicine Cosprin Ketoprofen S-A-C Tablets  Anacin Analgesic Tablets or Capsules Coumadin Korlgesic Salflex  Anacin Extra Strength Analgesic tablets or capsules CP-2 Tablets Lanoril Salicylate  Anaprox Cuprimine Capsules Levenox Salocol  Anexsia-D Dalteparin Magan Salsalate  Anodynos Darvon compound Magnesium Salicylate Sine-off  Ansaid Dasin Capsules Magsal Sodium Salicylate  Anturane Depen Capsules Marnal Soma  APF Arthritis pain formula Dewitt's Pills Measurin Stanback  Argesic Dia-Gesic Meclofenamic  Sulfinpyrazone  Arthritis Bayer Timed Release Aspirin Diclofenac Meclomen Sulindac  Arthritis pain formula Anacin Dicumarol Medipren Supac  Analgesic (Safety coated) Arthralgen Diffunasal Mefanamic Suprofen  Arthritis Strength Bufferin Dihydrocodeine Mepro Compound Suprol  Arthropan liquid Dopirydamole Methcarbomol with Aspirin Synalgos  ASA tablets/Enseals Disalcid Micrainin Tagament  Ascriptin Doan's Midol Talwin  Ascriptin A/D Dolene Mobidin Tanderil  Ascriptin Extra Strength Dolobid Moblgesic Ticlid  Ascriptin with Codeine Doloprin or Doloprin with Codeine Momentum Tolectin  Asperbuf Duoprin Mono-gesic Trendar  Aspergum Duradyne Motrin or Motrin IB Triminicin  Aspirin plain, buffered or enteric coated Durasal Myochrisine Trigesic  Aspirin Suppositories Easprin Nalfon Trillsate  Aspirin with Codeine Ecotrin Regular or Extra Strength Naprosyn Uracel  Atromid-S Efficin Naproxen Ursinus  Auranofin Capsules Elmiron Neocylate Vanquish  Axotal Emagrin Norgesic Verin  Azathioprine Empirin or Empirin with Codeine Normiflo Vitamin E  Azolid Emprazil Nuprin Voltaren  Bayer Aspirin plain, buffered or children's or timed BC Tablets or powders Encaprin Orgaran Warfarin Sodium  Buff-a-Comp Enoxaparin Orudis Zorpin  Buff-a-Comp with Codeine Equegesic Os-Cal-Gesic   Buffaprin Excedrin plain, buffered or Extra Strength Oxalid   Bufferin Arthritis Strength Feldene Oxphenbutazone   Bufferin plain or Extra Strength Feldene Capsules Oxycodone with Aspirin   Bufferin with Codeine Fenoprofen Fenoprofen Pabalate or Pabalate-SF   Buffets II Flogesic Panagesic   Buffinol plain or Extra Strength Florinal or Florinal with Codeine Panwarfarin   Buf-Tabs Flurbiprofen Penicillamine   Butalbital Compound Four-way cold tablets Penicillin   Butazolidin Fragmin Pepto-Bismol   Carbenicillin Geminisyn Percodan   Carna Arthritis Reliever Geopen Persantine   Carprofen Gold's salt Persistin   Chloramphenicol Goody's  Phenylbutazone   Chloromycetin Haltrain Piroxlcam   Clmetidine heparin Plaquenil   Cllnoril Hyco-pap Ponstel   Clofibrate Hydroxy chloroquine Propoxyphen         Before stopping any of these medications, be sure to consult the physician who ordered them.  Some, such as Coumadin (Warfarin) are ordered to prevent or treat serious conditions such as "deep thrombosis", "pumonary embolisms", and other heart problems.  The amount of time that you may need off of the medication may  also vary with the medication and the reason for which you were taking it.  If you are taking any of these medications, please make sure you notify your pain physician before you undergo any procedures.

## 2016-04-10 ENCOUNTER — Ambulatory Visit: Admitting: Pain Medicine

## 2016-04-12 LAB — TOXASSURE SELECT 13 (MW), URINE: PDF: 0

## 2016-04-15 ENCOUNTER — Ambulatory Visit: Admitting: Pain Medicine

## 2016-04-18 ENCOUNTER — Ambulatory Visit (INDEPENDENT_AMBULATORY_CARE_PROVIDER_SITE_OTHER): Payer: Self-pay | Admitting: Psychiatry

## 2016-04-22 ENCOUNTER — Encounter: Payer: Self-pay | Admitting: Pain Medicine

## 2016-04-22 ENCOUNTER — Ambulatory Visit: Attending: Pain Medicine | Admitting: Pain Medicine

## 2016-04-22 VITALS — BP 139/88 | HR 89 | Temp 97.0°F | Resp 14 | Ht 63.0 in | Wt 156.0 lb

## 2016-04-22 DIAGNOSIS — Z981 Arthrodesis status: Secondary | ICD-10-CM | POA: Diagnosis not present

## 2016-04-22 DIAGNOSIS — M5010 Cervical disc disorder with radiculopathy, unspecified cervical region: Secondary | ICD-10-CM

## 2016-04-22 DIAGNOSIS — M47816 Spondylosis without myelopathy or radiculopathy, lumbar region: Secondary | ICD-10-CM

## 2016-04-22 DIAGNOSIS — M5136 Other intervertebral disc degeneration, lumbar region: Secondary | ICD-10-CM | POA: Insufficient documentation

## 2016-04-22 DIAGNOSIS — M5416 Radiculopathy, lumbar region: Secondary | ICD-10-CM

## 2016-04-22 DIAGNOSIS — M5126 Other intervertebral disc displacement, lumbar region: Secondary | ICD-10-CM | POA: Insufficient documentation

## 2016-04-22 DIAGNOSIS — M797 Fibromyalgia: Secondary | ICD-10-CM

## 2016-04-22 DIAGNOSIS — M961 Postlaminectomy syndrome, not elsewhere classified: Secondary | ICD-10-CM

## 2016-04-22 DIAGNOSIS — M4806 Spinal stenosis, lumbar region: Secondary | ICD-10-CM | POA: Diagnosis not present

## 2016-04-22 DIAGNOSIS — M545 Low back pain: Secondary | ICD-10-CM | POA: Diagnosis present

## 2016-04-22 DIAGNOSIS — M5030 Other cervical disc degeneration, unspecified cervical region: Secondary | ICD-10-CM

## 2016-04-22 DIAGNOSIS — M5137 Other intervertebral disc degeneration, lumbosacral region: Secondary | ICD-10-CM

## 2016-04-22 DIAGNOSIS — G5602 Carpal tunnel syndrome, left upper limb: Secondary | ICD-10-CM

## 2016-04-22 DIAGNOSIS — M48062 Spinal stenosis, lumbar region with neurogenic claudication: Secondary | ICD-10-CM

## 2016-04-22 DIAGNOSIS — M5481 Occipital neuralgia: Secondary | ICD-10-CM

## 2016-04-22 DIAGNOSIS — M79606 Pain in leg, unspecified: Secondary | ICD-10-CM | POA: Diagnosis present

## 2016-04-22 MED ORDER — MIDAZOLAM HCL 5 MG/5ML IJ SOLN
INTRAMUSCULAR | Status: AC
  Administered 2016-04-22: 3 mg via INTRAVENOUS
  Filled 2016-04-22: qty 5

## 2016-04-22 MED ORDER — TRIAMCINOLONE ACETONIDE 40 MG/ML IJ SUSP: 40.0000 mg | Freq: Once | INTRAMUSCULAR | Status: DC

## 2016-04-22 MED ORDER — CEFUROXIME AXETIL 250 MG PO TABS: 250.0000 mg | ORAL_TABLET | Freq: Two times a day (BID) | ORAL | Status: DC

## 2016-04-22 MED ORDER — CEFAZOLIN SODIUM 1 G IJ SOLR
INTRAMUSCULAR | Status: AC
  Administered 2016-04-22: 09:00:00
  Filled 2016-04-22: qty 10

## 2016-04-22 MED ORDER — BUPIVACAINE HCL (PF) 0.25 % IJ SOLN: 30.0000 mL | Freq: Once | INTRAMUSCULAR | Status: DC

## 2016-04-22 MED ORDER — MIDAZOLAM HCL 5 MG/5ML IJ SOLN: 5.0000 mg | Freq: Once | INTRAMUSCULAR | Status: DC

## 2016-04-22 MED ORDER — FENTANYL CITRATE (PF) 100 MCG/2ML IJ SOLN: 100.0000 ug | Freq: Once | INTRAMUSCULAR | Status: DC

## 2016-04-22 MED ORDER — CEFAZOLIN IN D5W 1 GM/50ML IV SOLN: 1.0000 g | Freq: Once | INTRAVENOUS | Status: DC

## 2016-04-22 MED ORDER — ORPHENADRINE CITRATE 30 MG/ML IJ SOLN: 60.0000 mg | Freq: Once | INTRAMUSCULAR | Status: DC

## 2016-04-22 MED ORDER — FENTANYL CITRATE (PF) 100 MCG/2ML IJ SOLN
INTRAMUSCULAR | Status: AC
  Administered 2016-04-22: 50 ug
  Filled 2016-04-22: qty 2

## 2016-04-22 MED ORDER — BUPIVACAINE HCL (PF) 0.25 % IJ SOLN
INTRAMUSCULAR | Status: AC
  Administered 2016-04-22: 09:00:00
  Filled 2016-04-22: qty 30

## 2016-04-22 MED ORDER — LIDOCAINE HCL (PF) 1 % IJ SOLN: 10.0000 mL | Freq: Once | INTRAMUSCULAR | Status: DC

## 2016-04-22 MED ORDER — TRIAMCINOLONE ACETONIDE 40 MG/ML IJ SUSP
INTRAMUSCULAR | Status: AC
  Administered 2016-04-22: 09:00:00
  Filled 2016-04-22: qty 1

## 2016-04-22 MED ORDER — ORPHENADRINE CITRATE 30 MG/ML IJ SOLN
INTRAMUSCULAR | Status: AC
  Administered 2016-04-22: 09:00:00
  Filled 2016-04-22: qty 2

## 2016-04-22 MED ORDER — LACTATED RINGERS IV SOLN: 1000.0000 mL | INTRAVENOUS | Status: DC

## 2016-04-22 NOTE — Progress Notes (Signed)
Subjective:    Patient ID: Jennifer [name], female    DOB: 10-10-1956, 60 y.o.   MRN: [id number]  HPI  PROCEDURE PERFORMED: Lumbosacral selective nerve root block   NOTE: The patient is a 60 y.o. female who returns to [place] for further evaluation and treatment of pain involving the lumbar and lower extremity region. 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 L4-L5 broad-based disc bulge with moderate bilateral facet arthropathy and mild right foraminal stenosis . There is concern regarding intraspinal abnormalities contributing to the patient's symptomatology.. There is concern regarding component of lumbar radiculopathy being present and contributing to patient's symptoms The risks, benefits, and expectations of the procedure have been explained to the patient who was understanding and in agreement with suggested treatment plan. We will proceed with interventional treatment as discussed and as explained to the patient. The patient is understanding and in agreement with suggested treatment plan.   DESCRIPTION OF PROCEDURE: Lumbosacral selective nerve root block with IV Versed, IV fentanyl conscious sedation, EKG, blood pressure, pulse, capnography, and pulse oximetry monitoring. The procedure was performed with the patient in the prone position under fluoroscopic guidance. With the patient in the prone position, Betadine prep of proposed entry site was performed. Local anesthetic skin wheal of proposed needle entry site was prepared with 1.5% plain lidocaine with AP view of the lumbosacral spine.   PROCEDURE #1: Needle  placement at the right L 2 vertebral body: A 22 -gauge needle was inserted at the inferior border of the transverse process of the vertebral body with needle placed medial to the midline of the transverse process on AP view of the lumbosacral spine.   NEEDLE PLACEMENT AT  L3, L4, and L5  VERTEBRAL BODY LEVELS  Needle  placement was accomplished at L3, L4, and L5  vertebral body levels on the right side exactly as was accomplished at the L2  vertebral body level  and utilizing the same technique and under fluoroscopic guidance.   Needle placement was then verified on lateral view at all levels with needle tip documented to be in the posterior superior quadrant of the intervertebral foramen of  L 2, L3, L4, and L5. Following negative aspiration for heme and CSF at each level, each level was injected with 3 mL of 0.25% bupivacaine with Kenalog.     The patient tolerated the procedure well. A total of 10 mg of Kenalog was utilized for the procedure.   PLAN:  1. Medications: Will continue presently prescribed medications. Lyrica and hydrocodone acetaminophen 2. The patient is to undergo follow-up evaluation with PCP Dr.Olmedo  for evaluation of blood pressure and general medical condition status post procedure performed on today's visit. 3. Surgical follow-up evaluation.. Patient will undergo surgical reevaluation as discussed  4. Neurological evaluation.We will consider further neurological studies as discussed  5. May consider radiofrequency procedures, implantation type procedures and other treatment pending response to treatment and follow-up evaluation. 6. The patient has been advise do adhere to proper body mechanics and avoid activities which may aggravate condition. 7. The patient has been advised to call the Pain Management Center prior to scheduled return appointment should  there be significant change in the patient's condition or should the patient have other concerns regarding condition  prior to scheduled return appointment.   Review of Systems     Objective:   Physical Exam        Assessment & Plan:

## 2016-04-22 NOTE — Patient Instructions (Addendum)
PLAN   Continue present medication Lyrica and hydrocodone and begin taking antibiotics Ceftin today as prescribed  F/U PCP Dr.Olmedo  for evaliation of  BP and general medical  condition  F/U surgical evaluation. Neurosurgical evaluation as planned  F/U neurological evaluation. May consider PNCV EMG studies and other studies pending follow-up evaluations  May consider radiofrequency rhizolysis or intraspinal procedures pending response to present treatment and F/U evaluation   Patient to call Pain Management Center should patient have concerns prior to scheduled return appointmentPain Management Discharge Instructions  General Discharge Instructions :  If you need to reach your doctor call: Monday-Friday 8:00 am - 4:00 pm at [phone] or toll free [phone].  After clinic hours [phone] to have operator reach doctor.  Bring all of your medication bottles to all your appointments in the pain clinic.  To cancel or reschedule your appointment with Pain Management please remember to call 24 hours in advance to avoid a fee.  Refer to the educational materials which you have been given on: General Risks, I had my Procedure. Discharge Instructions, Post Sedation.  Post Procedure Instructions:  The drugs you were given will stay in your system until tomorrow, so for the next 24 hours you should not drive, make any legal decisions or drink any alcoholic beverages.  You may eat anything you prefer, but it is better to start with liquids then soups and crackers, and gradually work up to solid foods.  Please notify your doctor immediately if you have any unusual bleeding, trouble breathing or pain that is not related to your normal pain.  Depending on the type of procedure that was done, some parts of your body may feel week and/or numb.  This usually clears up by tonight or the next day.  Walk with the use of an assistive device or accompanied by an adult for the 24 hours.  You  may use ice on the affected area for the first 24 hours.  Put ice in a Ziploc bag and cover with a towel and place against area 15 minutes on 15 minutes off.  You may switch to heat after 24 hours.

## 2016-04-23 ENCOUNTER — Telehealth: Payer: Self-pay | Admitting: *Deleted

## 2016-04-23 NOTE — Telephone Encounter (Signed)
Spoke with patient re; procedure on yesterday, verbalizes no questions or concerns.  

## 2016-05-09 ENCOUNTER — Ambulatory Visit (INDEPENDENT_AMBULATORY_CARE_PROVIDER_SITE_OTHER): Admitting: Psychiatry

## 2016-05-09 ENCOUNTER — Telehealth: Payer: Self-pay | Admitting: Cardiovascular Disease

## 2016-05-09 ENCOUNTER — Encounter: Payer: Self-pay | Admitting: Psychiatry

## 2016-05-09 ENCOUNTER — Encounter: Payer: Self-pay | Admitting: Cardiovascular Disease

## 2016-05-09 ENCOUNTER — Encounter: Payer: Self-pay | Admitting: Pain Medicine

## 2016-05-09 ENCOUNTER — Ambulatory Visit: Attending: Pain Medicine | Admitting: Pain Medicine

## 2016-05-09 VITALS — BP 134/88 | HR 102 | Temp 97.9°F | Ht 63.0 in | Wt 153.0 lb

## 2016-05-09 VITALS — BP 138/88 | HR 103 | Temp 96.1°F | Resp 16 | Ht 63.0 in | Wt 154.0 lb

## 2016-05-09 DIAGNOSIS — M79606 Pain in leg, unspecified: Secondary | ICD-10-CM | POA: Diagnosis present

## 2016-05-09 DIAGNOSIS — M4806 Spinal stenosis, lumbar region: Secondary | ICD-10-CM | POA: Diagnosis not present

## 2016-05-09 DIAGNOSIS — F411 Generalized anxiety disorder: Secondary | ICD-10-CM

## 2016-05-09 DIAGNOSIS — M5416 Radiculopathy, lumbar region: Secondary | ICD-10-CM

## 2016-05-09 DIAGNOSIS — M5010 Cervical disc disorder with radiculopathy, unspecified cervical region: Secondary | ICD-10-CM

## 2016-05-09 DIAGNOSIS — M47896 Other spondylosis, lumbar region: Secondary | ICD-10-CM | POA: Insufficient documentation

## 2016-05-09 DIAGNOSIS — G629 Polyneuropathy, unspecified: Secondary | ICD-10-CM | POA: Insufficient documentation

## 2016-05-09 DIAGNOSIS — M533 Sacrococcygeal disorders, not elsewhere classified: Secondary | ICD-10-CM | POA: Diagnosis not present

## 2016-05-09 DIAGNOSIS — M5116 Intervertebral disc disorders with radiculopathy, lumbar region: Secondary | ICD-10-CM | POA: Insufficient documentation

## 2016-05-09 DIAGNOSIS — M961 Postlaminectomy syndrome, not elsewhere classified: Secondary | ICD-10-CM

## 2016-05-09 DIAGNOSIS — M5126 Other intervertebral disc displacement, lumbar region: Secondary | ICD-10-CM | POA: Diagnosis not present

## 2016-05-09 DIAGNOSIS — F3130 Bipolar disorder, current episode depressed, mild or moderate severity, unspecified: Secondary | ICD-10-CM | POA: Diagnosis not present

## 2016-05-09 DIAGNOSIS — M545 Low back pain: Secondary | ICD-10-CM | POA: Diagnosis present

## 2016-05-09 DIAGNOSIS — G5602 Carpal tunnel syndrome, left upper limb: Secondary | ICD-10-CM

## 2016-05-09 DIAGNOSIS — M5137 Other intervertebral disc degeneration, lumbosacral region: Secondary | ICD-10-CM

## 2016-05-09 DIAGNOSIS — I70209 Unspecified atherosclerosis of native arteries of extremities, unspecified extremity: Secondary | ICD-10-CM

## 2016-05-09 DIAGNOSIS — M51379 Other intervertebral disc degeneration, lumbosacral region without mention of lumbar back pain or lower extremity pain: Secondary | ICD-10-CM

## 2016-05-09 DIAGNOSIS — M797 Fibromyalgia: Secondary | ICD-10-CM

## 2016-05-09 DIAGNOSIS — M7138 Other bursal cyst, other site: Secondary | ICD-10-CM | POA: Insufficient documentation

## 2016-05-09 DIAGNOSIS — M48062 Spinal stenosis, lumbar region with neurogenic claudication: Secondary | ICD-10-CM

## 2016-05-09 DIAGNOSIS — M5481 Occipital neuralgia: Secondary | ICD-10-CM

## 2016-05-09 DIAGNOSIS — M5030 Other cervical disc degeneration, unspecified cervical region: Secondary | ICD-10-CM

## 2016-05-09 DIAGNOSIS — M47816 Spondylosis without myelopathy or radiculopathy, lumbar region: Secondary | ICD-10-CM

## 2016-05-09 MED ORDER — CARISOPRODOL 350 MG PO TABS: ORAL_TABLET | ORAL | Status: DC

## 2016-05-09 MED ORDER — HYDROCODONE-ACETAMINOPHEN 10-325 MG PO TABS: ORAL_TABLET | ORAL | Status: DC

## 2016-05-09 MED ORDER — ZOLPIDEM TARTRATE 5 MG PO TABS: 5.0000 mg | ORAL_TABLET | Freq: Every day | ORAL | Status: DC

## 2016-05-09 MED ORDER — LITHIUM CARBONATE ER 450 MG PO TBCR: 450.0000 mg | EXTENDED_RELEASE_TABLET | Freq: Every day | ORAL | Status: DC

## 2016-05-09 MED ORDER — QUETIAPINE FUMARATE 300 MG PO TABS: 300.0000 mg | ORAL_TABLET | Freq: Every day | ORAL | Status: DC

## 2016-05-09 MED ORDER — VILAZODONE HCL 40 MG PO TABS: 40.0000 mg | ORAL_TABLET | Freq: Every day | ORAL | Status: DC

## 2016-05-09 MED ORDER — ALPRAZOLAM 0.5 MG PO TABS: 0.5000 mg | ORAL_TABLET | Freq: Every evening | ORAL | Status: DC | PRN

## 2016-05-09 NOTE — Patient Instructions (Addendum)
PLAN   Continue present medication Lyrica Soma and hydrocodone acetaminophen No Xanax please  Lumbar sympathetic block to be performed at time of return appointment  F/U PCP Dr.Olmedo  for evaliation of  BP vascular evaluation and general medical  condition  F/U surgical evaluation. Neurosurgical evaluation as planned and as discussed Please ask secretaries if you have been scheduled for your neurosurgical evaluation of the lower back and lower extremities  TENS unit  use as discussed  Vascular evaluation of lower extremities as planned  Please ask Shatoya or other secretary the date of your PNCV/EMG studies of the lower extremities with Dr. Melrose Nakayama  F/U neurological evaluation for PNCV/EMG studies with Dr. Melrose Nakayama as discussed  May consider radiofrequency rhizolysis or intraspinal procedures pending response to present treatment and F/U evaluation   Patient to call [facility] should patient have concerns prior to scheduled return appointmentLumbar Sympathetic Block Patient Information  Description: The lumbar plexus is a group of nerves that are part of the sympathetic nervous system.  These nerves supply organs in the pelvis and legs.  Lumbar sympathetic blocks are utilized for the diagnosis and treatment of painful conditions in these areas.   The lumbar plexus is located on both sides of the aorta at approximately the level of the second lumbar vertebral body.  The block will be performed with you lying on your abdomen with a pillow underneath.  Using direct x-ray guidance,   The plexus will be located on both sides of the spine.  Numbing medicine will be used to deaden the skin prior to needle insertion.  In most cases, a small amount of sedation can be give by IV prior to the numbing medicine.  One or two small needles will be placed near the plexus and local anesthetic will be injected.  This may make your leg(s) feel warm.  The Entire block usually lasts about 15-25  minutes.  Conditions which may be treated by lumbar sympathetic block:   Reflex sympathetic dystrophy  Phantom limb pain  Peripheral neuropathy  Peripheral vascular disease ( inadequate blood flow )  Cancer pain of pelvis, leg and kidney  Preparation for the injection:  1. Do note eat any solid food or diary products within 8 hours of your appointment. 2. You may drink clear liquids up to 3 hours before appointment.  Clear liquids include water, black coffee, juice or soda.  No milk or cream please. 3. You may take your regular medication, including pain medications, with a sip of water before you appointment.  Diabetics should hold regular insulin ( if taken separately ) and take 1/2 NPH dose the morning of the procedure .  Carry some sugar containing items with you to your appointment. 4. A driver must accompany you and be prepared to drive you home after your procedure. 5. Bring all your current medication with you. 6. An IV may be inserted and sedation may be given at the discretion of the physician.  7. A blood pressure cuff, EKG and other monitors will often be applied during the procedure.  Some patients may need to have extra oxygen administered for a short period. 8. You will be asked to provide medical information, including your allergies and medications, prior to the procedure.  We must know immediately if your taking blood thinners (like Coumadin/Warfarin) or if you are allergic to IV iodine contrast (dye).  We must know if you could possibly be pregnant.  Possible side-effects   Bleeding from needle site or  deeper  Infection (rare, can require surgery)  Nerve injury (rare)  Numbness & tingling (temporary)  Collapsed lung (rare)  Spinal headache (a headache worse with upright posture)  Light-headedness (temporary)  Pain at injection site (several days)  Decreased blood pressure (temporary)  Weakness in legs (temporary)  Seizure or other drug reaction  (rare)  Call if you experience:   Fever/chills associated with headache or increased back/ neck pain  Headache worsened by an upright position  New onset weakness or numbness of an extremity below the injection site  Hives or difficulty breathing ( go to the emergency room)  Inflammation or drainage at the injections site(s)  New symptoms which are concerning to you  Please note:  If effective, we will often do a series of 2-3 injections spaced 3-6 weeks apart to maximally decrease your pain.  If initial series is effective, you may be a candidate for a more permanent block of the lumbar sympathetic plexus.  If you have any questions please call [phone] [facility]

## 2016-05-09 NOTE — Telephone Encounter (Signed)
Pt c/o of Chest Pain: STAT if CP now or developed within 24 hours  1. Are you having CP right now? No   2. Are you experiencing any other symptoms (ex. SOB, nausea, vomiting, sweating)? Fatigue Sob on exertion nausea sweating squeezing pressure pain BLE pain R worse aching into foot cannot stand on or walk long periods without rest pcp said cholesterol was high and may have veins in legs closing   3. How long have you been experiencing CP? Week ago felt like before cabg  4. Is your CP continuous or coming and going? Comes and goes   5. Have you taken Nitroglycerin? Yes yesterday after 2nd pain was relieved but started again soon after and wasn't quite as bad  ?  PATIENT IN OFFICE NOW .

## 2016-05-09 NOTE — Progress Notes (Signed)
   Subjective:    Patient ID: Jennifer [name], female    DOB: 1955-11-24, 60 y.o.   MRN: [id number]  HPI  The patient is a 60 year old female who returns to pain management for further evaluation and treatment of pain involving the lower back and lower extremity region predominantly. The patient has had improvement of her pain with prior interventional treatment in pain management and continuing his medications consisting of Lyrica Soma and hydrocodone acetaminophen. We discussed patient's condition and patient will proceed with further evaluation including PNCV EMG studies, vascular evaluation, and neurosurgical evaluation. We we'll proceed with lumbar sympathetic block at time return appointment in attempt to decrease pain of the lower extremity regions. There is concern regarding both vascular and neurological etiology of patient's symptoms. The patient was with understanding and agreed to suggested treatment plan   Review of Systems     Objective:   Physical Exam   There was tenderness of the splenius capitis and occipitalis regions of mild to moderate degree. There was tenderness of the cervical facet cervical paraspinal musculature region of mild to moderate degree. The patient appeared to be with slightly decreased grip strength with Tinel and Phalen's maneuver reproducing mild discomfort. The patient was with unremarkable Spurling's maneuver. The patient was able to perform drop test without significant difficulty with mild tenderness of the acromioclavicular and glenohumeral joint regions. Palpation over the region of the lumbar region was with tenderness to palpation of moderate to moderately severe degree with lateral bending rotation extension and palpation of the lumbar facets reproducing moderate discomfort. Straight leg raising was limited to approximately 20 with questionably increased pain with dorsiflexion noted. EHL strength appeared to be decreased. There was tenderness of the  PSIS and PII S regions a moderate degree with mild tenderness of the greater trochanteric region iliotibial band region. Abdomen was nontender with no costovertebral tenderness noted     Assessment & Plan:      Degenerative disc disease lumbar spine  MRI revealed degenerative changes with prior surgical intervention lumbar region, L5-S1 posterior lumbar interbody fusion, 10 x 12 mm right facet intraspinal synovial cyst, mass effect upon the right lateral thecal sac and mild medial displacement of the S1 nerve root as it abutting the foraminal portion of the L5 nerve root. L4-L5 broad-based disc bulge with moderate bilateral facet arthropathy and mild right foraminal stenosis   Lumbar stenosis with neurogenic claudication  Lumbar radiculopathy  Lumbar facet syndrome   Neuropathy  Sacroiliac joint dysfunction     PLAN   Continue present medication Lyrica Soma and hydrocodone acetaminophen No Xanax please  Lumbar sympathetic block to be performed at time of return appointment  F/U PCP Dr.Olmedo  for evaliation of  BP vascular evaluation and general medical  condition  F/U surgical evaluation. Neurosurgical evaluation as planned and as discussed Please ask secretaries if you have been scheduled for your neurosurgical evaluation of the lower back and lower extremities  TENS unit  use as discussed  Vascular evaluation of lower extremities as planned  Please ask Shatoya or other secretary the date of your PNCV/EMG studies of the lower extremities with Dr. Melrose Nakayama  F/U neurological evaluation for PNCV/EMG studies with Dr. Melrose Nakayama as discussed  May consider radiofrequency rhizolysis or intraspinal procedures pending response to present treatment and F/U evaluation   Patient to call [facility] should patient have concerns prior to scheduled return appointment

## 2016-05-09 NOTE — Progress Notes (Signed)
Safety precautions to be maintained throughout the outpatient stay will include: orient to surroundings, keep bed in low position, maintain call bell within reach at all times, provide assistance with transfer out of bed and ambulation.  

## 2016-05-09 NOTE — Telephone Encounter (Signed)
Duplicate entry. See documentation note for details.

## 2016-05-09 NOTE — Progress Notes (Signed)
Patient came into office today reporting chest pain the last couple of days. She had an appointment upstairs at Dr. Primus Bravo office this morning and wanted to stop by and be seen by Dr. Rockey Situ. She currently does not have any chest pain at this time and has other appointments scheduled today with other physicians. She stated that last night she had an episode after eating an omelet of crushing pain that was really bad. I asked if she was on anything for reflux and she is on prilosec. She did take 2 nitroglycerins and that helped ease the pain. She stated that she possibly needs a catheterization again. Let her know that Dr. Rockey Situ would need to see her and evaluate the need for further testing. We scheduled her an appointment for 05/15/16 at 3:00pm with Dr. Rockey Situ. Instructed her to go to local emergency room if she should develop chest pain prior to that appointment. She verbalized understanding of our conversation, agreement with plan of care, and had no further questions at this time.

## 2016-05-09 NOTE — Progress Notes (Signed)
BH MD/PA/NP OP Progress Note  05/09/2016 8:47 AM Jennifer [name]  MRN:  [id number]  Subjective:    Patient is a 60 year-old married female who presented for the follow-up appointment. She reported she has been feeling stressed out due to the conflict between her husband and her son. She reported that her son lives with them but her husband wants him to leave their property. [place] and does not have any other people living around them. Her son is currently on the disability and she has good support from him. She reported that she is the one is talking in between them. She reported that her son is a big support for them at this time. She feels depressed and has been crying a lot. She wants her husband to understand the situation. Patient reported that she wants the medications to be adjusted as she has been feeling more depressed. She is compliant with her medications. She also went for her appointments with her cardiologist and pain management today. She appeared depressed and anxious during the interview. She currently denied having any suicidal homicidal ideations or plans.   Chief Complaint:  Chief Complaint    Follow-up; Medication Refill     Visit Diagnosis:     ICD-9-CM ICD-10-CM   1. Bipolar I disorder, most recent episode depressed ([place]) 296.50 F31.30   2. Generalized anxiety disorder 300.02 F41.1     Past Medical History:  Past Medical History  Diagnosis Date  . GERD (gastroesophageal reflux disease)   . Hyperlipidemia   . Hypertension   . Enlarged liver   . Diabetes mellitus   . Low back pain   . Depression with anxiety   . CAD (coronary artery disease)   . Von Willebrand disease ([place])   . Migraines   . Depression   . Persistent disorder of initiating or maintaining sleep   . Generalized anxiety disorder   . Bipolar depression ([place])   . Anxiety   . Neuropathy ([place])   . 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 Cardiac catheterization      CAD,PCI of LAD, Cypher stent 2.5-28mm  . Cardiac catheterization      PCI of mid-LAD in stent restenosis with a drug-eluting stent  . Breast implant removal    . Coronary artery bypass graft   08-12-2011    CABG x 3  . Cardiac catheterization  06/03/13    [facility];MID LAD 100 % STENOSIS; PRIOR STENT; MID RCA 40 % STENOSIS  . Coronary stent placement    . Lumbar fusion      L1-S1  . Abdominal hysterectomy    . Back surgery     Family History:  Family History  Problem Relation Age of Onset  . Hypertension Other     family hx  . Hyperlipidemia Other     family hx  . Diabetes Other     family hx  . Aneurysm Father     abdominal  . Diabetes Father   . Alcohol abuse Father   . Hypertension Father   . Aneurysm Brother     abdominal  . Alcohol abuse Brother   . Hypertension Brother   . Diabetes Mother   . Alcohol abuse Mother    Social History:  Social History   Social History  . Marital Status: Married    Spouse Name: N/A  .  Number of Children: N/A  . Years of Education: N/A   Social History Main Topics  . Smoking status: Former Smoker -- 16 years    Types: Cigarettes    Quit date: 06/20/2010  . Smokeless tobacco: Never Used  . Alcohol Use: No  . Drug Use: No  . Sexual Activity: Yes   Other Topics Concern  . None   Social History Narrative   Additional History:  Lives with her family members and has good relationship with them.  Assessment:   Musculoskeletal: Strength & Muscle Tone: within normal limits Gait & Station: normal Patient leans: N/A  Psychiatric Specialty Exam: Depression        Associated symptoms include insomnia and myalgias.  Associated symptoms include no suicidal ideas.   Review of Systems  Constitutional: Positive for malaise/fatigue.  HENT: Negative.  Negative for nosebleeds and tinnitus.   Eyes: Negative.  Negative for photophobia.  Respiratory: Negative.    Cardiovascular: Negative.  Negative for palpitations.  Gastrointestinal: Negative for nausea and diarrhea.  Genitourinary: Negative for frequency.  Musculoskeletal: Positive for myalgias and back pain.  Skin: Negative.   Neurological: Negative for tremors.  Endo/Heme/Allergies: Negative for environmental allergies.  Psychiatric/Behavioral: Positive for depression. Negative for suicidal ideas and substance abuse. The patient is nervous/anxious and has insomnia.   All other systems reviewed and are negative.   Blood pressure 134/88, pulse 102, temperature 97.9 F (36.6 C), temperature source Tympanic, height _0  (1.6 m), weight 153 lb (69.4 kg), SpO2 95 %.Body mass index is 27.11 kg/(m^2).  General Appearance: Casual  Eye Contact:  Fair  Speech:  Slow  Volume:  Decreased  Mood:  Anxious and Depressed  Affect:  Congruent  Thought Process:  Logical  Orientation:  Full (Time, Place, and Person)  Thought Content:  WDL  Suicidal Thoughts:  No  Homicidal Thoughts:  No  Memory:  Immediate;   Fair  Judgement:  Fair  Insight:  Fair  Psychomotor Activity:  Normal  Concentration:  Fair  Recall:  [employer] of Knowledge: Fair  Language: Fair  Akathisia:  No  Handed:  Right  AIMS (if indicated):    Assets:  Communication Skills Desire for Improvement Housing Social Support  ADL's:  Intact  Cognition: WNL  Sleep:  good   Is the patient at risk to self?  No. Has the patient been a risk to self in the past 6 months?  No. Has the patient been a risk to self within the distant past?  Yes.   Is the patient a risk to others?  No. Has the patient been a risk to others in the past 6 months?  No. Has the patient been a risk to others within the distant past?  No.  Current Medications: Current Outpatient Prescriptions  Medication Sig Dispense Refill  . albuterol-ipratropium (COMBIVENT) 18-103 MCG/ACT inhaler Inhale into the lungs as needed for wheezing or shortness of breath.    .  ALPRAZolam (XANAX) 0.5 MG tablet Take 1 tablet (0.5 mg total) by mouth at bedtime as needed for anxiety. 30 tablet 2  . amLODipine (NORVASC) 5 MG tablet Take 1 tablet (5 mg total) by mouth 2 (two) times daily. 180 tablet 4  . aspirin (GOODSENSE ASPIRIN) 81 MG chewable tablet Chew by mouth.    Marland Kitchen atorvastatin (LIPITOR) 40 MG tablet Take 1 tablet (40 mg total) by mouth daily. 90 tablet 3  . atorvastatin (LIPITOR) 80 MG tablet     . Blood Glucose Monitoring Suppl (  FREESTYLE FREEDOM LITE) W/DEVICE KIT Use 3 (three) times daily. 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 Dx Code: 250.00    . carisoprodol (SOMA) 350 MG tablet Limit 1 tablet by mouth per day or twice per day if tolerated 50 tablet 0  . carvedilol (COREG) 12.5 MG tablet Take 1 tablet (12.5 mg total) by mouth 2 (two) times daily with a meal. 180 tablet 3  . fluticasone (FLONASE) 50 MCG/ACT nasal spray Place into the nose.    Marland Kitchen HYDROcodone-acetaminophen (NORCO) 10-325 MG tablet Limit 3-6 tabs by mouth per day if tolerated 180 tablet 0  . Insulin Isophane & Regular (HUMULIN 70/30 PEN [place]) Inject 27 Units into the skin 2 (two) times daily.     Marland Kitchen lisinopril (PRINIVIL,ZESTRIL) 20 MG tablet Take 1 tablet (20 mg total) by mouth daily. 90 tablet 3  . lithium carbonate 300 MG capsule Take 1 capsule (300 mg total) by mouth daily. 90 capsule 2  . metFORMIN (GLUCOPHAGE) 500 MG tablet Take 1,000 mg by mouth 2 (two) times daily with a meal.    . nitroGLYCERIN (NITROSTAT) 0.4 MG SL tablet Place 1 tablet (0.4 mg total) under the tongue every 5 (five) minutes as needed for chest pain. 25 tablet 6  . omeprazole (PRILOSEC) 20 MG capsule Take 20 mg by mouth daily.     Marland Kitchen PROAIR HFA 108 (90 Base) MCG/ACT inhaler     . QUEtiapine (SEROQUEL) 300 MG tablet Take 1 tablet (300 mg total) by mouth at bedtime. Take 1.5 tablet at bed time. 135 tablet 2  . RESTASIS 0.05 % ophthalmic emulsion     . Spacer/Aero-Holding Chambers (E-Z SPACER) inhaler Reported on 01/15/2016     . Vilazodone HCl (VIIBRYD) 40 MG TABS Take 1 tablet (40 mg total) by mouth daily. 90 tablet 2  . zolpidem (AMBIEN) 5 MG tablet Take 1 tablet (5 mg total) by mouth at bedtime. 30 tablet 2  . HUMULIN 70/30 KWIKPEN (70-30) 100 UNIT/ML PEN     . lisinopril (PRINIVIL,ZESTRIL) 30 MG tablet      Current Facility-Administered Medications  Medication Dose Route Frequency Provider Last Rate Last Dose  . bupivacaine (PF) (MARCAINE) 0.25 % injection 30 mL  30 mL Other Once Mohammed Kindle, MD      . bupivacaine (PF) (MARCAINE) 0.25 % injection 30 mL  30 mL Other Once Mohammed Kindle, MD      . bupivacaine (PF) (MARCAINE) 0.25 % injection 30 mL  30 mL Other Once Mohammed Kindle, MD      . bupivacaine (PF) (MARCAINE) 0.25 % injection 30 mL  30 mL Other Once Mohammed Kindle, MD      . bupivacaine (PF) (MARCAINE) 0.25 % injection 30 mL  30 mL Other Once Mohammed Kindle, MD      . ceFAZolin (ANCEF) IVPB 1 g/50 mL premix  1 g Intravenous Once Mohammed Kindle, MD      . ceFAZolin (ANCEF) IVPB 1 g/50 mL premix  1 g Intravenous Once Mohammed Kindle, MD      . ceFAZolin (ANCEF) IVPB 1 g/50 mL premix  1 g Intravenous Once Mohammed Kindle, MD      . ceFAZolin (ANCEF) IVPB 1 g/50 mL premix  1 g Intravenous Once Mohammed Kindle, MD      . ceFAZolin (ANCEF) IVPB 1 g/50 mL premix  1 g Intravenous Once Mohammed Kindle, MD      . ceFAZolin (ANCEF) IVPB 1 g/50 mL premix  1 g Intravenous Once Mohammed Kindle,  MD      . fentaNYL (SUBLIMAZE) injection 100 mcg  100 mcg Intravenous Once Mohammed Kindle, MD      . fentaNYL (SUBLIMAZE) injection 100 mcg  100 mcg Intravenous Once Mohammed Kindle, MD      . fentaNYL (SUBLIMAZE) injection 100 mcg  100 mcg Intravenous Once Mohammed Kindle, MD      . fentaNYL (SUBLIMAZE) injection 100 mcg  100 mcg Intravenous Once Mohammed Kindle, MD      . fentaNYL (SUBLIMAZE) injection 100 mcg  100 mcg Intravenous Once Mohammed Kindle, MD      . lactated ringers infusion 1,000 mL  1,000 mL Intravenous Continuous Mohammed Kindle, MD       . lactated ringers infusion 1,000 mL  1,000 mL Intravenous Continuous Mohammed Kindle, MD      . lactated ringers infusion 1,000 mL  1,000 mL Intravenous Continuous Mohammed Kindle, MD      . lactated ringers infusion 1,000 mL  1,000 mL Intravenous Continuous Mohammed Kindle, MD      . lactated ringers infusion 1,000 mL  1,000 mL Intravenous Continuous Mohammed Kindle, MD      . lactated ringers infusion 1,000 mL  1,000 mL Intravenous Continuous Mohammed Kindle, MD      . lidocaine (PF) (XYLOCAINE) 1 % injection 10 mL  10 mL Subcutaneous Once Mohammed Kindle, MD      . lidocaine (PF) (XYLOCAINE) 1 % injection 10 mL  10 mL Subcutaneous Once Mohammed Kindle, MD      . lidocaine (PF) (XYLOCAINE) 1 % injection 10 mL  10 mL Subcutaneous Once Mohammed Kindle, MD      . lidocaine (PF) (XYLOCAINE) 1 % injection 10 mL  10 mL Subcutaneous Once Mohammed Kindle, MD      . lidocaine (PF) (XYLOCAINE) 1 % injection 10 mL  10 mL Subcutaneous Once Mohammed Kindle, MD      . midazolam (VERSED) 5 MG/5ML injection 5 mg  5 mg Intravenous Once Mohammed Kindle, MD      . midazolam (VERSED) 5 MG/5ML injection 5 mg  5 mg Intravenous Once Mohammed Kindle, MD      . midazolam (VERSED) 5 MG/5ML injection 5 mg  5 mg Intravenous Once Mohammed Kindle, MD      . midazolam (VERSED) 5 MG/5ML injection 5 mg  5 mg Intravenous Once Mohammed Kindle, MD      . midazolam (VERSED) 5 MG/5ML injection 5 mg  5 mg Intravenous Once Mohammed Kindle, MD      . midazolam (VERSED) injection 5 mg  5 mg Intravenous Once Mohammed Kindle, MD      . orphenadrine (NORFLEX) injection 60 mg  60 mg Intramuscular Once Mohammed Kindle, MD      . orphenadrine (NORFLEX) injection 60 mg  60 mg Intramuscular Once Mohammed Kindle, MD      . orphenadrine (NORFLEX) injection 60 mg  60 mg Intramuscular Once Mohammed Kindle, MD      . orphenadrine (NORFLEX) injection 60 mg  60 mg Intramuscular Once Mohammed Kindle, MD      . orphenadrine (NORFLEX) injection 60 mg  60 mg Intramuscular Once Mohammed Kindle, MD      . sodium chloride 0.9 % injection 20 mL  20 mL Other Once Mohammed Kindle, MD      . sodium chloride flush (NS) 0.9 % injection 20 mL  20 mL Other Once Mohammed Kindle, MD      . triamcinolone acetonide (KENALOG-40) injection 40 mg  40 mg  Other Once Mohammed Kindle, MD      . triamcinolone acetonide [facility]) injection 40 mg  40 mg Other Once Mohammed Kindle, MD      . triamcinolone acetonide [facility]) injection 40 mg  40 mg Other Once Mohammed Kindle, MD      . triamcinolone acetonide (KENALOG-40) injection 40 mg  40 mg Other Once Mohammed Kindle, MD      . triamcinolone acetonide (KENALOG-40) injection 40 mg  40 mg Other Once Mohammed Kindle, MD      . triamcinolone acetonide (KENALOG-40) injection 40 mg  40 mg Other Once Mohammed Kindle, MD      . triamcinolone acetonide (KENALOG-40) injection 40 mg  40 mg Other Once Mohammed Kindle, MD        Medical Decision Making:  Review of Psycho-Social Stressors (1) and Review of Last Therapy Session (1)  Treatment Plan Summary:Medication management   Anxiety Patient will continue on Viibryd 62m po qam and Xanax 0.5 mg po qhs prn as prescribed  Mood symptoms She will continue on Seroquel 300 mg 1-1/2 pill at bedtime. She was given 90 day supply of the medication Patient is also on lithium carbonate And will titrate to 450 mg by mouth daily at bedtime   Insomnia Patient takes Ambien 5 mg at bedtime and discussed with her about the adverse effects of the medication in detail and she demonstrated understanding  Follow-up She will follow up in 1 months or earlier depending on her symptoms     More than 50% of the time spent in psychoeducation, counseling and coordination of care.  Time spent with the patient 25 minutes   This note was generated in part or whole with voice recognition software. Voice regonition is usually quite accurate but there are transcription errors that can and very often do occur. I apologize for any typographical  errors that were not detected and corrected.    URainey Pines MD  05/09/2016, 8:47 AM

## 2016-05-15 ENCOUNTER — Encounter: Payer: Self-pay | Admitting: Cardiovascular Disease

## 2016-05-15 ENCOUNTER — Ambulatory Visit (INDEPENDENT_AMBULATORY_CARE_PROVIDER_SITE_OTHER): Admitting: Cardiovascular Disease

## 2016-05-15 VITALS — BP 108/64 | HR 76 | Ht 63.0 in | Wt 156.0 lb

## 2016-05-15 DIAGNOSIS — I209 Angina pectoris, unspecified: Secondary | ICD-10-CM | POA: Diagnosis not present

## 2016-05-15 DIAGNOSIS — R079 Chest pain, unspecified: Secondary | ICD-10-CM

## 2016-05-15 DIAGNOSIS — E1159 Type 2 diabetes mellitus with other circulatory complications: Secondary | ICD-10-CM

## 2016-05-15 DIAGNOSIS — Z951 Presence of aortocoronary bypass graft: Secondary | ICD-10-CM | POA: Diagnosis not present

## 2016-05-15 DIAGNOSIS — I2510 Atherosclerotic heart disease of native coronary artery without angina pectoris: Secondary | ICD-10-CM

## 2016-05-15 DIAGNOSIS — F17200 Nicotine dependence, unspecified, uncomplicated: Secondary | ICD-10-CM

## 2016-05-15 DIAGNOSIS — E785 Hyperlipidemia, unspecified: Secondary | ICD-10-CM

## 2016-05-15 NOTE — Progress Notes (Signed)
Patient ID: Jennifer [name], female   DOB: 1956/09/22, 60 y.o.   MRN: [id number] Cardiology Office Note  Date:  05/15/2016   ID:  Brieana, Shimmin 1955/12/21, MRN [id number]  PCP:  Valera Castle, MD   Chief Complaint  Patient presents with  . other    C/o chest pain radiates both arms.Meds reviewed verbally with pt.    HPI:  60 year old woman with a history of coronary artery disease, PTCA of the LAD with 2.5 x 28 mm Cypher stent in January 2007, Repeat catheterization August 2011 with Stent placement of the mid LAD for 90% lesion, catheterization August 06, 2011 for chest pain that showed severe mid LAD in-stent restenosis at the site of the previous drug-eluting stent ( DES stent x2 placed in the LAD in 2007 and 2011), also with severe mid RCA disease, moderate to severe proximal diagonal #1 disease, ejection fraction 55%. hospitalization at [facility] on 12/09/2013 bypass surgery at [place] on August 12 2011 by Dr. Roxan Hockey. She had bypass graft x3 with a LIMA to the LAD, vein graft to the RCA, vein graft to the diagonal. She presents for routine follow-up of her coronary artery disease  Last catheterization June 03, 2013 Diabetes poorly controlled in the past,  In follow-up today, she reports having difficulty with medication compliance  Diabetes uncontrolled, hemoglobin A1c greater than 9 Bilateral Leg pain, seen by Dr. Lucky Cowboy,  scheduled for lower extremity arterial Doppler rule out claudication Was off lipitor, back on it. On last clinic visit was not taking Lipitor Electricity feeling and "bugs" on legs, feels she has neuropathy (managed by dr. Melrose Nakayama) Chest pain stuttering, "feels like it did before her bypass surgery" Takes metformin  (med noncompliance) Was not taking insulin, reports this has been ordered for her to take  She continues to see pain clinic, psychiatry  EKG on today's visit shows normal sinus rhythm with rate 76 bpm, no significant ST or  T-wave changes  Other past medical history problems with Chronic anxiety, stress at home, confusion at times. Treated by a psychiatrist in [place], started on Valium. She is tired all time, sleeping a good number of hours per day. In the past she has taken benzodiazepines, Prozac. previously on Lamictal and Cymbalta, She sees Dr. Eliberto Ivory, also sees Dr. Leilani Merl in [place]. She does travel up to [place] to help family .   In follow-up today, she reports that she's not been taking some of her medications.  cardiac catheterization 06/03/2013. 100% occluded mid LAD at the site of a stent, mild mid RCA disease, saphenous vein graft to the diagonal and LIMA graft to the LAD are patent. Occluded saphenous vein graft to the distal RCA. Medical management was recommended.  July 08 2012 right-sided chest pain. She ruled out for myocardial infarction by cardiac enzymes.  nuclear stress test which showed no evidence of ischemia with an ejection fraction of 44%. She was noted to be hypertensive with systolic blood pressure of 180.   PMH:   has a past medical history of GERD (gastroesophageal reflux disease); Hyperlipidemia; Hypertension; Enlarged liver; Diabetes mellitus; Low back pain; Depression with anxiety; CAD (coronary artery disease); Von Willebrand disease ([place]); Migraines; Depression; Persistent disorder of initiating or maintaining sleep; Generalized anxiety disorder; Bipolar depression ([place]); Anxiety; Neuropathy ([place]); and Cancer ([place]).  PSH:    Past Surgical History  Procedure Laterality Date  . Cardiac catheterization      CAD,PCI of LAD, Cypher stent 2.5-28mm  . Cardiac catheterization  PCI of mid-LAD in stent restenosis with a drug-eluting stent  . Breast implant removal    . Coronary artery bypass graft   08-12-2011    CABG x 3  . Cardiac catheterization  06/03/13    [facility];MID LAD 100 % STENOSIS; PRIOR STENT; MID RCA 40 % STENOSIS  . Coronary stent placement    . Lumbar  fusion      L1-S1  . Abdominal hysterectomy    . Back surgery      Current Outpatient Prescriptions  Medication Sig Dispense Refill  . albuterol-ipratropium (COMBIVENT) 18-103 MCG/ACT inhaler Inhale into the lungs as needed for wheezing or shortness of breath.    . ALPRAZolam (XANAX) 0.5 MG tablet Take 1 tablet (0.5 mg total) by mouth at bedtime as needed for anxiety. 30 tablet 2  . amLODipine (NORVASC) 5 MG tablet Take 1 tablet (5 mg total) by mouth 2 (two) times daily. 180 tablet 4  . aspirin (GOODSENSE ASPIRIN) 81 MG chewable tablet Chew 81 mg by mouth daily.     Marland Kitchen atorvastatin (LIPITOR) 80 MG tablet Take 80 mg by mouth daily.     . Blood Glucose Monitoring Suppl (FREESTYLE FREEDOM LITE) W/DEVICE KIT Use 3 (three) times daily. 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 Dx Code: 250.00    . carisoprodol (SOMA) 350 MG tablet Limit 1 tablet by mouth per day or twice per day if tolerated (Patient taking differently: as needed. Limit 1 tablet by mouth per day or twice per day if tolerated) 50 tablet 0  . carvedilol (COREG) 12.5 MG tablet Take 1 tablet (12.5 mg total) by mouth 2 (two) times daily with a meal. 180 tablet 3  . fluticasone (FLONASE) 50 MCG/ACT nasal spray Place into the nose as needed.     Marland Kitchen HUMULIN 70/30 KWIKPEN (70-30) 100 UNIT/ML PEN 17 Units 2 (two) times daily.     Marland Kitchen HYDROcodone-acetaminophen (NORCO) 10-325 MG tablet Limit 3-6 tabs by mouth per day if tolerated (Patient taking differently: 3 (three) times daily. Limit 3-6 tabs by mouth per day if tolerated) 180 tablet 0  . Insulin Isophane & Regular (HUMULIN 70/30 PEN Titanic) Inject 27 Units into the skin 2 (two) times daily.     Marland Kitchen lisinopril (PRINIVIL,ZESTRIL) 20 MG tablet Take 1 tablet (20 mg total) by mouth daily. 90 tablet 3  . lithium carbonate (ESKALITH) 450 MG CR tablet Take 1 tablet (450 mg total) by mouth at bedtime. 30 tablet 1  . metFORMIN (GLUCOPHAGE) 500 MG tablet Take 1,000 mg by mouth 2 (two) times daily with a meal.     . nitroGLYCERIN (NITROSTAT) 0.4 MG SL tablet Place 1 tablet (0.4 mg total) under the tongue every 5 (five) minutes as needed for chest pain. 25 tablet 6  . omeprazole (PRILOSEC) 20 MG capsule Take 20 mg by mouth daily.     Marland Kitchen PROAIR HFA 108 (90 Base) MCG/ACT inhaler as needed.     Marland Kitchen QUEtiapine (SEROQUEL) 300 MG tablet Take 1 tablet (300 mg total) by mouth at bedtime. Take 1.5 tablet at bed time. (Patient taking differently: Take 1.5 tablet at bed time.) 135 tablet 2  . RESTASIS 0.05 % ophthalmic emulsion     . Spacer/Aero-Holding Chambers (E-Z SPACER) inhaler Reported on 01/15/2016    . Vilazodone HCl (VIIBRYD) 40 MG TABS Take 1 tablet (40 mg total) by mouth daily. 90 tablet 2  . zolpidem (AMBIEN) 5 MG tablet Take 1 tablet (5 mg total) by mouth at bedtime. [place]  tablet 2   Current Facility-Administered Medications  Medication Dose Route Frequency Provider Last Rate Last Dose  . bupivacaine (PF) (MARCAINE) 0.25 % injection 30 mL  30 mL Other Once Mohammed Kindle, MD      . bupivacaine (PF) (MARCAINE) 0.25 % injection 30 mL  30 mL Other Once Mohammed Kindle, MD      . bupivacaine (PF) (MARCAINE) 0.25 % injection 30 mL  30 mL Other Once Mohammed Kindle, MD      . bupivacaine (PF) (MARCAINE) 0.25 % injection 30 mL  30 mL Other Once Mohammed Kindle, MD      . bupivacaine (PF) (MARCAINE) 0.25 % injection 30 mL  30 mL Other Once Mohammed Kindle, MD      . ceFAZolin (ANCEF) IVPB 1 g/50 mL premix  1 g Intravenous Once Mohammed Kindle, MD      . ceFAZolin (ANCEF) IVPB 1 g/50 mL premix  1 g Intravenous Once Mohammed Kindle, MD      . ceFAZolin (ANCEF) IVPB 1 g/50 mL premix  1 g Intravenous Once Mohammed Kindle, MD      . ceFAZolin (ANCEF) IVPB 1 g/50 mL premix  1 g Intravenous Once Mohammed Kindle, MD      . ceFAZolin (ANCEF) IVPB 1 g/50 mL premix  1 g Intravenous Once Mohammed Kindle, MD      . ceFAZolin (ANCEF) IVPB 1 g/50 mL premix  1 g Intravenous Once Mohammed Kindle, MD      . fentaNYL (SUBLIMAZE) injection 100 mcg  100  mcg Intravenous Once Mohammed Kindle, MD      . fentaNYL (SUBLIMAZE) injection 100 mcg  100 mcg Intravenous Once Mohammed Kindle, MD      . fentaNYL (SUBLIMAZE) injection 100 mcg  100 mcg Intravenous Once Mohammed Kindle, MD      . fentaNYL (SUBLIMAZE) injection 100 mcg  100 mcg Intravenous Once Mohammed Kindle, MD      . fentaNYL (SUBLIMAZE) injection 100 mcg  100 mcg Intravenous Once Mohammed Kindle, MD      . lactated ringers infusion 1,000 mL  1,000 mL Intravenous Continuous Mohammed Kindle, MD      . lactated ringers infusion 1,000 mL  1,000 mL Intravenous Continuous Mohammed Kindle, MD      . lactated ringers infusion 1,000 mL  1,000 mL Intravenous Continuous Mohammed Kindle, MD      . lactated ringers infusion 1,000 mL  1,000 mL Intravenous Continuous Mohammed Kindle, MD      . lactated ringers infusion 1,000 mL  1,000 mL Intravenous Continuous Mohammed Kindle, MD      . lactated ringers infusion 1,000 mL  1,000 mL Intravenous Continuous Mohammed Kindle, MD      . lidocaine (PF) (XYLOCAINE) 1 % injection 10 mL  10 mL Subcutaneous Once Mohammed Kindle, MD      . lidocaine (PF) (XYLOCAINE) 1 % injection 10 mL  10 mL Subcutaneous Once Mohammed Kindle, MD      . lidocaine (PF) (XYLOCAINE) 1 % injection 10 mL  10 mL Subcutaneous Once Mohammed Kindle, MD      . lidocaine (PF) (XYLOCAINE) 1 % injection 10 mL  10 mL Subcutaneous Once Mohammed Kindle, MD      . lidocaine (PF) (XYLOCAINE) 1 % injection 10 mL  10 mL Subcutaneous Once Mohammed Kindle, MD      . midazolam (VERSED) 5 MG/5ML injection 5 mg  5 mg Intravenous Once Mohammed Kindle, MD      . midazolam (VERSED) 5 MG/5ML  injection 5 mg  5 mg Intravenous Once Mohammed Kindle, MD      . midazolam (VERSED) 5 MG/5ML injection 5 mg  5 mg Intravenous Once Mohammed Kindle, MD      . midazolam (VERSED) 5 MG/5ML injection 5 mg  5 mg Intravenous Once Mohammed Kindle, MD      . midazolam (VERSED) 5 MG/5ML injection 5 mg  5 mg Intravenous Once Mohammed Kindle, MD      . midazolam (VERSED)  injection 5 mg  5 mg Intravenous Once Mohammed Kindle, MD      . orphenadrine (NORFLEX) injection 60 mg  60 mg Intramuscular Once Mohammed Kindle, MD      . orphenadrine (NORFLEX) injection 60 mg  60 mg Intramuscular Once Mohammed Kindle, MD      . orphenadrine (NORFLEX) injection 60 mg  60 mg Intramuscular Once Mohammed Kindle, MD      . orphenadrine (NORFLEX) injection 60 mg  60 mg Intramuscular Once Mohammed Kindle, MD      . orphenadrine (NORFLEX) injection 60 mg  60 mg Intramuscular Once Mohammed Kindle, MD      . sodium chloride 0.9 % injection 20 mL  20 mL Other Once Mohammed Kindle, MD      . sodium chloride flush (NS) 0.9 % injection 20 mL  20 mL Other Once Mohammed Kindle, MD      . triamcinolone acetonide (KENALOG-40) injection 40 mg  40 mg Other Once Mohammed Kindle, MD      . triamcinolone acetonide (KENALOG-40) injection 40 mg  40 mg Other Once Mohammed Kindle, MD      . triamcinolone acetonide (KENALOG-40) injection 40 mg  40 mg Other Once Mohammed Kindle, MD      . triamcinolone acetonide (KENALOG-40) injection 40 mg  40 mg Other Once Mohammed Kindle, MD      . triamcinolone acetonide (KENALOG-40) injection 40 mg  40 mg Other Once Mohammed Kindle, MD      . triamcinolone acetonide [facility]) injection 40 mg  40 mg Other Once Mohammed Kindle, MD      . triamcinolone acetonide (KENALOG-40) injection 40 mg  40 mg Other Once Mohammed Kindle, MD         Allergies:   No known allergies   Social History:  The patient  reports that she quit smoking about 5 years ago. Her smoking use included Cigarettes. She quit after 16 years of use. She has never used smokeless tobacco. She reports that she does not drink alcohol or use illicit drugs.   Family History:   family history includes Alcohol abuse in her brother, father, and mother; Aneurysm in her brother and father; Diabetes in her father, mother, and other; Hyperlipidemia in her other; Hypertension in her brother, father, and other.    Review of Systems: Review  of Systems  Constitutional: Negative.   Respiratory: Negative.   Cardiovascular: Positive for chest pain.  Gastrointestinal: Negative.   Musculoskeletal: Negative.        Leg pain  Neurological: Negative.   Psychiatric/Behavioral: The patient is nervous/anxious.   All other systems reviewed and are negative.    PHYSICAL EXAM: VS:  BP 108/64 mmHg  Pulse 76  Ht '5\' 3"'  (1.6 m)  Wt 156 lb (70.761 kg)  BMI 27.64 kg/m2 , BMI Body mass index is 27.64 kg/(m^2). GEN: Well nourished, well developed, in no acute distress HEENT: normal Neck: no JVD, carotid bruits, or masses Cardiac: RRR; no murmurs, rubs, or gallops,no edema  Respiratory:  clear to auscultation bilaterally, normal work of breathing GI: soft, nontender, nondistended, + BS MS: no deformity or atrophy Skin: warm and dry, no rash Neuro:  Strength and sensation are intact Psych: euthymic mood, full affect    Recent Labs: No results found for requested labs within last 365 days.    Lipid Panel Lab Results  Component Value Date   CHOL 200 07/09/2012   HDL 20* 07/09/2012   LDLCALC SEE COMMENT 07/09/2012   TRIG 679* 07/09/2012      Wt Readings from Last 3 Encounters:  05/15/16 156 lb (70.761 kg)  05/09/16 153 lb (69.4 kg)  05/09/16 154 lb (69.854 kg)       ASSESSMENT AND PLAN:  Chest pain, unspecified chest pain type - Plan: EKG 12-Lead, NM Myocar Multi W/Spect W/Wall Motion / EF  chest pain concerning for angina, unstable Recommended stress test, last catheterization in 2014 Medication noncompliance, poorly controlled diabetes and hyperlipidemia   Angina pectoris, unspecified ([place]) - Plan: NM Myocar Multi W/Spect W/Wall Motion / EF As above, now with chest pain concerning for angina, stress test as above  CAD in native artery - Plan: NM Myocar Multi W/Spect W/Wall Motion / EF Known severe three-vessel disease, bypass surgery 2012  Hyperlipidemia Long discussion concerning her medication  noncompliance. Stressed importance of being compliant with her Lipitor Goal LDL less than 70  SMOKER We have encouraged her to continue to work on weaning her cigarettes and smoking cessation. She will continue to work on this and does not want any assistance with chantix.   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 NM Myocar Multi W/Spect W/Wall Motion / EF  . EKG 12-Lead     Signed, Esmond Plants, M.D., Ph.D. 05/15/2016  [place], [place]

## 2016-05-15 NOTE — Patient Instructions (Addendum)
Medication Instructions:   Please stay on lipitor daily Stay on metforming/insulin  Labwork:  None needed  Testing/Procedures:  We will schedule a lexiscan nuclear stress test for chest pain, angina, known CAD, CABG [facility] [place]  Your caregiver has ordered a Stress Test with nuclear imaging. The purpose of this test is to evaluate the blood supply to your heart muscle. This procedure is referred to as a "Non-Invasive Stress Test." This is because other than having an IV started in your vein, nothing is inserted or "invades" your body. Cardiac stress tests are done to find areas of poor blood flow to the heart by determining the extent of coronary artery disease (CAD). Some patients exercise on a treadmill, which naturally increases the blood flow to your heart, while others who are  unable to walk on a treadmill due to physical limitations have a pharmacologic/chemical stress agent called Lexiscan . This medicine will mimic walking on a treadmill by temporarily increasing your coronary blood flow.   Please note: these test may take anywhere between 2-4 hours to complete  PLEASE REPORT TO [facility] AT THE FIRST DESK WILL DIRECT YOU WHERE TO GO  Date of Procedure:______Thursday, July 27_________  Arrival Time for Procedure:_____7:15 am__________  Instructions regarding medication:   __X__ : Hold diabetes medication morning of procedure  __X__:  Hold CARVEDILOL the night before and morning of procedure   Do not eat or drink after midnight  No caffeine for 24 hours prior to test  No smoking 24 hours prior to test.  Your medication may be taken with water.  If your doctor stopped a medication because of this test, do not take that medication.  Ladies, please do not wear dresses.  Skirts or pants are appropriate. Please wear a short sleeve shirt.  No perfume, cologne or lotion.  Follow-Up: It was a pleasure seeing you in the office  today. Please call us if you have new issues that need to be addressed before your next appt.  [phone]  Your physician wants you to follow-up in: 6 months.  You will receive a reminder letter in the mail two months in advance. If you don't receive a letter, please call our office to schedule the follow-up appointment.  If you need a refill on your cardiac medications before your next appointment, please call your pharmacy.    Cardiac Nuclear Scanning A cardiac nuclear scan is used to check your heart for problems, such as the following:  A portion of the heart is not getting enough blood.  Part of the heart muscle has died, which happens with a heart attack.  The heart wall is not working normally.  In this test, a radioactive dye (tracer) is injected into your bloodstream. After the tracer has traveled to your heart, a scanning device is used to measure how much of the tracer is absorbed by or distributed to various areas of your heart. LET [facility] CARE PROVIDER KNOW ABOUT:  Any allergies you have.  All medicines you are taking, including vitamins, herbs, eye drops, creams, and over-the-counter medicines.  Previous problems you or members of your family have had with the use of anesthetics.  Any blood disorders you have.  Previous surgeries you have had.  Medical conditions you have.  RISKS AND COMPLICATIONS Generally, this is a safe procedure. However, as with any procedure, problems can occur. Possible problems include:   Serious chest pain.  Rapid heartbeat.  Sensation of warmth in your  chest. This usually passes quickly. BEFORE THE PROCEDURE Ask your health care provider about changing or stopping your regular medicines. PROCEDURE This procedure is usually done at a hospital and takes 2-4 hours.  An IV tube is inserted into one of your veins.  Your health care provider will inject a small amount of radioactive tracer through the tube.  You will then  wait for 20-40 minutes while the tracer travels through your bloodstream.  You will lie down on an exam table so images of your heart can be taken. Images will be taken for about 15-20 minutes.  You will exercise on a treadmill or stationary bike. While you exercise, your heart activity will be monitored with an electrocardiogram (ECG), and your blood pressure will be checked.  If you are unable to exercise, you may be given a medicine to make your heart beat faster.  When blood flow to your heart has peaked, tracer will again be injected through the IV tube.  After 20-40 minutes, you will get back on the exam table and have more images taken of your heart.  When the procedure is over, your IV tube will be removed. AFTER THE PROCEDURE  You will likely be able to leave shortly after the test. Unless your health care provider tells you otherwise, you may return to your normal schedule, including diet, activities, and medicines.  Make sure you find out how and when you will get your test results.   This information is not intended to replace advice given to you by your health care provider. Make sure you discuss any questions you have with your health care provider.   Document Released: 11/08/2004 Document Revised: 10/19/2013 Document Reviewed: 09/22/2013 Elsevier Interactive Patient Education [employer].

## 2016-05-22 ENCOUNTER — Ambulatory Visit: Admitting: Pain Medicine

## 2016-05-22 ENCOUNTER — Telehealth: Payer: Self-pay | Admitting: *Deleted

## 2016-05-23 ENCOUNTER — Encounter
Admission: RE | Admit: 2016-05-23 | Discharge: 2016-05-23 | Disposition: A | Discharge status: Home / Self Care | Source: Ambulatory Visit | Attending: Cardiovascular Disease | Admitting: Cardiovascular Disease

## 2016-05-23 DIAGNOSIS — I2510 Atherosclerotic heart disease of native coronary artery without angina pectoris: Secondary | ICD-10-CM | POA: Insufficient documentation

## 2016-05-23 DIAGNOSIS — Z951 Presence of aortocoronary bypass graft: Secondary | ICD-10-CM | POA: Diagnosis not present

## 2016-05-23 DIAGNOSIS — R079 Chest pain, unspecified: Secondary | ICD-10-CM

## 2016-05-23 DIAGNOSIS — I209 Angina pectoris, unspecified: Secondary | ICD-10-CM

## 2016-05-23 MED ORDER — TECHNETIUM TC 99M TETROFOSMIN IV KIT
30.0000 | PACK | Freq: Once | INTRAVENOUS | Status: AC | PRN
  Administered 2016-05-23: 31.52 via INTRAVENOUS

## 2016-05-23 MED ORDER — TECHNETIUM TC 99M TETROFOSMIN IV KIT
12.9400 | PACK | Freq: Once | INTRAVENOUS | Status: AC | PRN
  Administered 2016-05-23: 12.94 via INTRAVENOUS

## 2016-05-23 MED ORDER — REGADENOSON 0.4 MG/5ML IV SOLN
0.4000 mg | Freq: Once | INTRAVENOUS | Status: AC
  Administered 2016-05-23: 0.4 mg via INTRAVENOUS

## 2016-05-24 ENCOUNTER — Telehealth: Payer: Self-pay

## 2016-05-24 LAB — NM MYOCAR MULTI W/SPECT W/WALL MOTION / EF
CHL CUP NUCLEAR SDS: 1
CHL CUP NUCLEAR SRS: 1
CHL CUP NUCLEAR SSS: 0
CHL CUP RESTING HR STRESS: 72 {beats}/min
LVDIAVOL: 50 mL (ref 46–106)
LVSYSVOL: 24 mL
NUC STRESS TID: 0.91
Peak HR: 95 {beats}/min
Percent HR: 59 %

## 2016-05-24 NOTE — Telephone Encounter (Signed)
Patient states she has an appointment on 05-28-16 and would discuss plan of care with Dr Primus Bravo at that time.

## 2016-05-24 NOTE — Telephone Encounter (Signed)
Pt wants Dr Primus Bravo to know that she has been going to other doctors and would like Dr. Primus Bravo and her to come up with a different plan of care. Pt wants a nurse to call her

## 2016-05-28 ENCOUNTER — Encounter: Payer: Self-pay | Admitting: Pain Medicine

## 2016-05-28 ENCOUNTER — Ambulatory Visit: Attending: Pain Medicine | Admitting: Pain Medicine

## 2016-05-28 VITALS — BP 148/92 | HR 91 | Temp 98.2°F | Resp 18 | Ht 63.0 in | Wt 145.0 lb

## 2016-05-28 DIAGNOSIS — M542 Cervicalgia: Secondary | ICD-10-CM | POA: Diagnosis present

## 2016-05-28 DIAGNOSIS — Z981 Arthrodesis status: Secondary | ICD-10-CM | POA: Diagnosis not present

## 2016-05-28 DIAGNOSIS — M47896 Other spondylosis, lumbar region: Secondary | ICD-10-CM | POA: Insufficient documentation

## 2016-05-28 DIAGNOSIS — M5137 Other intervertebral disc degeneration, lumbosacral region: Secondary | ICD-10-CM

## 2016-05-28 DIAGNOSIS — M4806 Spinal stenosis, lumbar region: Secondary | ICD-10-CM | POA: Diagnosis not present

## 2016-05-28 DIAGNOSIS — M5126 Other intervertebral disc displacement, lumbar region: Secondary | ICD-10-CM | POA: Insufficient documentation

## 2016-05-28 DIAGNOSIS — G629 Polyneuropathy, unspecified: Secondary | ICD-10-CM | POA: Insufficient documentation

## 2016-05-28 DIAGNOSIS — M62830 Muscle spasm of back: Secondary | ICD-10-CM | POA: Insufficient documentation

## 2016-05-28 DIAGNOSIS — M545 Low back pain: Secondary | ICD-10-CM | POA: Diagnosis present

## 2016-05-28 DIAGNOSIS — M533 Sacrococcygeal disorders, not elsewhere classified: Secondary | ICD-10-CM | POA: Diagnosis not present

## 2016-05-28 DIAGNOSIS — M5416 Radiculopathy, lumbar region: Secondary | ICD-10-CM

## 2016-05-28 DIAGNOSIS — M5116 Intervertebral disc disorders with radiculopathy, lumbar region: Secondary | ICD-10-CM | POA: Diagnosis not present

## 2016-05-28 DIAGNOSIS — I739 Peripheral vascular disease, unspecified: Secondary | ICD-10-CM | POA: Diagnosis not present

## 2016-05-28 DIAGNOSIS — R51 Headache: Secondary | ICD-10-CM | POA: Diagnosis present

## 2016-05-28 DIAGNOSIS — M47816 Spondylosis without myelopathy or radiculopathy, lumbar region: Secondary | ICD-10-CM

## 2016-05-28 DIAGNOSIS — M5481 Occipital neuralgia: Secondary | ICD-10-CM

## 2016-05-28 DIAGNOSIS — M961 Postlaminectomy syndrome, not elsewhere classified: Secondary | ICD-10-CM

## 2016-05-28 DIAGNOSIS — M48062 Spinal stenosis, lumbar region with neurogenic claudication: Secondary | ICD-10-CM

## 2016-05-28 DIAGNOSIS — M5030 Other cervical disc degeneration, unspecified cervical region: Secondary | ICD-10-CM

## 2016-05-28 DIAGNOSIS — M797 Fibromyalgia: Secondary | ICD-10-CM

## 2016-05-28 MED ORDER — PREGABALIN 50 MG PO CAPS: ORAL_CAPSULE | ORAL | 2 refills | Status: DC

## 2016-05-28 MED ORDER — HYDROCODONE-ACETAMINOPHEN 10-325 MG PO TABS: ORAL_TABLET | ORAL | 0 refills | Status: DC

## 2016-05-28 MED ORDER — CARISOPRODOL 350 MG PO TABS: ORAL_TABLET | ORAL | 0 refills | Status: DC

## 2016-05-28 NOTE — Progress Notes (Signed)
Safety precautions to be maintained throughout the outpatient stay will include: orient to surroundings, keep bed in low position, maintain call bell within reach at all times, provide assistance with transfer out of bed and ambulation.  

## 2016-05-28 NOTE — Progress Notes (Signed)
The patient is a 60 year old female who returns to pain management for further evaluation and treatment of pain involving the lumbar lower extremity region with pain involving cervical region and headaches of lesser degree. The patient states that she had a pop's type right sensation occurring in the lumbar region. The patient states she has had increased pain of the lower back lower extremity region. We've discussed patient undergoing neurosurgical evaluation and we will reschedule patient for neurosurgical evaluation again on today's visit. Patient stated that pain is aggravated by standing walking and becomes more in [place] patient spends time on the feet. The patient states that despite present medication regimen she continues to have severely disabling pain. We will proceed with scheduling patient for lumbosacral selective nerve root block to be performed at time return appointment. We will continue medications at the present dose avoid masking any symptoms and patient is to call pain management as well as proceed evaluation by neurosurgeon as prescribed. The patient states that she continues Lyrica Soma and hydrocodone acetaminophen without undesirable side effects.    Physical examination  There was tenderness of the splenius capitis and occipitalis region palpation of these regions reproduced pain of mild-to-moderate degree. There were no new lesions of the head and neck noted. Palpation over the region of the acromioclavicular and glenohumeral joint regions reproduces minimal discomfort and patient appeared to be with bilaterally equal grip strength without significant increase of pain with Tinel and Phalen's maneuver. Palpation over the thoracic region was without crepitus of the thoracic region and was with evidence of moderate muscle spasms involving the lower thoracic paraspinal musculature region. He to be with unremarkable Spurling's maneuver. There was tenderness over the lumbar facet  region lumbar paraspinal musculature region of moderate degree with straight leg raising tolerates approximately 20 with questionably increased pain with dorsiflexion noted. No definite sensory deficit or dermatomal distribution detected. There appeared to be negative clonus negative Homans. DTRs were difficult to elicit patient had difficulty relaxing. There was tenderness over the PSIS and PII S region a moderate degree with mild tenderness along the greater trochanteric region iliotibial band region. Abdomen nontender with no costovertebral tenderness noted     Assessment  Degenerative disc disease lumbar spine  MRI revealed degenerative changes with prior surgical intervention lumbar region, L5-S1 posterior lumbar interbody fusion, 10 x 12 mm right facet intraspinal synovial cyst, mass effect upon the right lateral thecal sac and mild medial displacement of the S1 nerve root as it abutting the foraminal portion of the L5 nerve root. L4-L5 broad-based disc bulge with moderate bilateral facet arthropathy and mild right foraminal stenosis   Lumbar stenosis with neurogenic claudication  Lumbar radiculopathy  Lumbar facet syndrome   Peripheral vascular disease  Neuropathy  Sacroiliac joint dysfunction     PLAN   Continue present medication Lyrica Soma and hydrocodone acetaminophen No Xanax please  Lumbosacral selective nerve root block to be performed at time of return appointment  F/U PCP Dr.Olmedo  for evaliation of  BP vascular evaluation and general medical  condition  F/U surgical evaluation. Neurosurgical evaluation as planned and as discussed Please ask secretaries if you have been scheduled for your neurosurgical evaluation of the lower back and lower extremities  TENS unit  use as discussed  Vascular evaluation of lower extremities  Please ask Shatoya or other secretary the date of your PNCV/EMG studies of the lower extremities with Dr. Melrose Nakayama  F/U neurological  evaluation for PNCV/EMG studies with Dr. Melrose Nakayama as discussed  May consider radiofrequency rhizolysis or intraspinal procedures pending response to present treatment and F/U evaluation   Patient to call Pain Management Center should patient have concerns prior to scheduled return appointment

## 2016-05-28 NOTE — Patient Instructions (Addendum)
PLAN   Continue present medication Lyrica Soma and hydrocodone acetaminophen No Xanax please  Lumbosacral selective nerve root block to be performed at time of return appointment  F/U PCP Dr.Olmedo  for evaliation of  BP vascular evaluation and general medical  condition  F/U surgical evaluation. Neurosurgical evaluation as planned and as discussed Please ask secretaries if you have been scheduled for your neurosurgical evaluation of the lower back and lower extremities  TENS unit  use as discussed  Vascular evaluation of lower extremities  Please ask Shatoya or other secretary the date of your PNCV/EMG studies of the lower extremities with Dr. Melrose Nakayama  F/U neurological evaluation for PNCV/EMG studies with Dr. Melrose Nakayama as discussed  May consider radiofrequency rhizolysis or intraspinal procedures pending response to present treatment and F/U evaluation   Patient to call Pain Management Center should patient have concerns prior to scheduled return appointmentGENERAL RISKS AND COMPLICATIONS  What are the risk, side effects and possible complications? Generally speaking, most procedures are safe.  However, with any procedure there are risks, side effects, and the possibility of complications.  The risks and complications are dependent upon the sites that are lesioned, or the type of nerve block to be performed.  The closer the procedure is to the spine, the more serious the risks are.  Great care is taken when placing the radio frequency needles, block needles or lesioning probes, but sometimes complications can occur. 1. Infection: Any time there is an injection through the skin, there is a risk of infection.  This is why sterile conditions are used for these blocks.  There are four possible types of infection. 1. Localized skin infection. 2. Central Nervous System Infection-This can be in the form of Meningitis, which can be deadly. 3. Epidural Infections-This can be in the form of an  epidural abscess, which can cause pressure inside of the spine, causing compression of the spinal cord with subsequent paralysis. This would require an emergency surgery to decompress, and there are no guarantees that the patient would recover from the paralysis. 4. Discitis-This is an infection of the intervertebral discs.  It occurs in about 1% of discography procedures.  It is difficult to treat and it may lead to surgery.        2. Pain: the needles have to go through skin and soft tissues, will cause soreness.       3. Damage to internal structures:  The nerves to be lesioned may be near blood vessels or    other nerves which can be potentially damaged.       4. Bleeding: Bleeding is more common if the patient is taking blood thinners such as  aspirin, Coumadin, Ticiid, Plavix, etc., or if he/she have some genetic predisposition  such as hemophilia. Bleeding into the spinal canal can cause compression of the spinal  cord with subsequent paralysis.  This would require an emergency surgery to  decompress and there are no guarantees that the patient would recover from the  paralysis.       5. Pneumothorax:  Puncturing of a lung is a possibility, every time a needle is introduced in  the area of the chest or upper back.  Pneumothorax refers to free air around the  collapsed lung(s), inside of the thoracic cavity (chest cavity).  Another two possible  complications related to a similar event would include: Hemothorax and Chylothorax.   These are variations of the Pneumothorax, where instead of air around the collapsed  lung(s), you may  have blood or chyle, respectively.       6. Spinal headaches: They may occur with any procedures in the area of the spine.       7. Persistent CSF (Cerebro-Spinal Fluid) leakage: This is a rare problem, but may occur  with prolonged intrathecal or epidural catheters either due to the formation of a fistulous  track or a dural tear.       8. Nerve damage: By working so close  to the spinal cord, there is always a possibility of  nerve damage, which could be as serious as a permanent spinal cord injury with  paralysis.       9. Death:  Although rare, severe deadly allergic reactions known as "Anaphylactic  reaction" can occur to any of the medications used.      10. Worsening of the symptoms:  We can always make thing worse.  What are the chances of something like this happening? Chances of any of this occuring are extremely low.  By statistics, you have more of a chance of getting killed in a motor vehicle accident: while driving to the hospital than any of the above occurring .  Nevertheless, you should be aware that they are possibilities.  In general, it is similar to taking a shower.  Everybody knows that you can slip, hit your head and get killed.  Does that mean that you should not shower again?  Nevertheless always keep in mind that statistics do not mean anything if you happen to be on the wrong side of them.  Even if a procedure has a 1 (one) in a 1,000,000 (million) chance of going wrong, it you happen to be that one..Also, keep in mind that by statistics, you have more of a chance of having something go wrong when taking medications.  Who should not have this procedure? If you are on a blood thinning medication (e.g. Coumadin, Plavix, see list of "Blood Thinners"), or if you have an active infection going on, you should not have the procedure.  If you are taking any blood thinners, please inform your physician.  How should I prepare for this procedure?  Do not eat or drink anything at least six hours prior to the procedure.  Bring a driver with you .  It cannot be a taxi.  Come accompanied by an adult that can drive you back, and that is strong enough to help you if your legs get weak or numb from the local anesthetic.  Take all of your medicines the morning of the procedure with just enough water to swallow them.  If you have diabetes, make sure that you  are scheduled to have your procedure done first thing in the morning, whenever possible.  If you have diabetes, take only half of your insulin dose and notify our nurse that you have done so as soon as you arrive at the clinic.  If you are diabetic, but only take blood sugar pills (oral hypoglycemic), then do not take them on the morning of your procedure.  You may take them after you have had the procedure.  Do not take aspirin or any aspirin-containing medications, at least eleven (11) days prior to the procedure.  They may prolong bleeding.  Wear loose fitting clothing that may be easy to take off and that you would not mind if it got stained with Betadine or blood.  Do not wear any jewelry or perfume  Remove any nail coloring.  It will interfere with  some of our monitoring equipment.  NOTE: Remember that this is not meant to be interpreted as a complete list of all possible complications.  Unforeseen problems may occur.  BLOOD THINNERS The following drugs contain aspirin or other products, which can cause increased bleeding during surgery and should not be taken for 2 weeks prior to and 1 week after surgery.  If you should need take something for relief of minor pain, you may take acetaminophen which is found in Tylenol,m Datril, Anacin-3 and Panadol. It is not blood thinner. The products listed below are.  Do not take any of the products listed below in addition to any listed on your instruction sheet.  A.P.C or A.P.C with Codeine Codeine Phosphate Capsules #3 Ibuprofen Ridaura  ABC compound Congesprin Imuran rimadil  Advil Cope Indocin Robaxisal  Alka-Seltzer Effervescent Pain Reliever and Antacid Coricidin or Coricidin-D  Indomethacin Rufen  Alka-Seltzer plus Cold Medicine Cosprin Ketoprofen S-A-C Tablets  Anacin Analgesic Tablets or Capsules Coumadin Korlgesic Salflex  Anacin Extra Strength Analgesic tablets or capsules CP-2 Tablets Lanoril Salicylate  Anaprox Cuprimine Capsules  Levenox Salocol  Anexsia-D Dalteparin Magan Salsalate  Anodynos Darvon compound Magnesium Salicylate Sine-off  Ansaid Dasin Capsules Magsal Sodium Salicylate  Anturane Depen Capsules Marnal Soma  APF Arthritis pain formula Dewitt's Pills Measurin Stanback  Argesic Dia-Gesic Meclofenamic Sulfinpyrazone  Arthritis Bayer Timed Release Aspirin Diclofenac Meclomen Sulindac  Arthritis pain formula Anacin Dicumarol Medipren Supac  Analgesic (Safety coated) Arthralgen Diffunasal Mefanamic Suprofen  Arthritis Strength Bufferin Dihydrocodeine Mepro Compound Suprol  Arthropan liquid Dopirydamole Methcarbomol with Aspirin Synalgos  ASA tablets/Enseals Disalcid Micrainin Tagament  Ascriptin Doan's Midol Talwin  Ascriptin A/D Dolene Mobidin Tanderil  Ascriptin Extra Strength Dolobid Moblgesic Ticlid  Ascriptin with Codeine Doloprin or Doloprin with Codeine Momentum Tolectin  Asperbuf Duoprin Mono-gesic Trendar  Aspergum Duradyne Motrin or Motrin IB Triminicin  Aspirin plain, buffered or enteric coated Durasal Myochrisine Trigesic  Aspirin Suppositories Easprin Nalfon Trillsate  Aspirin with Codeine Ecotrin Regular or Extra Strength Naprosyn Uracel  Atromid-S Efficin Naproxen Ursinus  Auranofin Capsules Elmiron Neocylate Vanquish  Axotal Emagrin Norgesic Verin  Azathioprine Empirin or Empirin with Codeine Normiflo Vitamin E  Azolid Emprazil Nuprin Voltaren  Bayer Aspirin plain, buffered or children's or timed BC Tablets or powders Encaprin Orgaran Warfarin Sodium  Buff-a-Comp Enoxaparin Orudis Zorpin  Buff-a-Comp with Codeine Equegesic Os-Cal-Gesic   Buffaprin Excedrin plain, buffered or Extra Strength Oxalid   Bufferin Arthritis Strength Feldene Oxphenbutazone   Bufferin plain or Extra Strength Feldene Capsules Oxycodone with Aspirin   Bufferin with Codeine Fenoprofen Fenoprofen Pabalate or Pabalate-SF   Buffets II Flogesic Panagesic   Buffinol plain or Extra Strength Florinal or Florinal with  Codeine Panwarfarin   Buf-Tabs Flurbiprofen Penicillamine   Butalbital Compound Four-way cold tablets Penicillin   Butazolidin Fragmin Pepto-Bismol   Carbenicillin Geminisyn Percodan   Carna Arthritis Reliever Geopen Persantine   Carprofen Gold's salt Persistin   Chloramphenicol Goody's Phenylbutazone   Chloromycetin Haltrain Piroxlcam   Clmetidine heparin Plaquenil   Cllnoril Hyco-pap Ponstel   Clofibrate Hydroxy chloroquine Propoxyphen         Before stopping any of these medications, be sure to consult the physician who ordered them.  Some, such as Coumadin (Warfarin) are ordered to prevent or treat serious conditions such as "deep thrombosis", "pumonary embolisms", and other heart problems.  The amount of time that you may need off of the medication may also vary with the medication and the reason for which you were taking it.  If you are taking any of these medications, please make sure you notify your pain physician before you undergo any procedures.         Selective Nerve Root Block Patient Information  Description: Specific nerve roots exit the spinal canal and these nerves can be compressed and inflamed by a bulging disc and bone spurs.  By injecting steroids on the nerve root, we can potentially decrease the inflammation surrounding these nerves, which often leads to decreased pain.  Also, by injecting local anesthesia on the nerve root, this can provide [place] helpful information to give to your referring doctor if it decreases your pain.  Selective nerve root blocks can be done along the spine from the neck to the low back depending on the location of your pain.   After numbing the skin with local anesthesia, a small needle is passed to the nerve root and the position of the needle is verified using x-ray pictures.  After the needle is in correct position, we then deposit the medication.  You may experience a pressure sensation while this is being done.  The entire block usually  lasts less than 15 minutes.  Conditions that may be treated with selective nerve root blocks:  Low back and leg pain  Spinal stenosis  Diagnostic block prior to potential surgery  Neck and arm pain  Post laminectomy syndrome  Preparation for the injection:  1. Do not eat any solid food or dairy products within 8 hours of your appointment. 2. You may drink clear liquids up to 3 hours before an appointment.  Clear liquids include water, black coffee, juice or soda.  No milk or cream please. 3. You may take your regular medications, including pain medications, with a sip of water before your appointment.  Diabetics should hold regular insulin (if taken separately) and take 1/2 normal NPH dose the morning of the procedure.  Carry some sugar containing items with you to your appointment. 4. A driver must accompany you and be prepared to drive you home after your procedure. 5. Bring all your current medications with you. 6. An IV may be inserted and sedation may be given at the discretion of the physician. 7. A blood pressure cuff, EKG, and other monitors will often be applied during the procedure.  Some patients may need to have extra oxygen administered for a short period. 8. You will be asked to provide medical information, including allergies, prior to the procedure.  We must know immediately if you are taking blood  Thinners (like Coumadin) or if you are allergic to IV iodine contrast (dye).  Possible side-effects: All are usually temporary  Bleeding from needle site  Light headedness  Numbness and tingling  Decreased blood pressure  Weakness in arms/legs  Pressure sensation in back/neck  Pain at injection site (several days)  Possible complications: All are extremely rare  Infection  Nerve injury  Spinal headache (a headache wore with upright position)  Call if you experience:  Fever/chills associated with headache or increased back/neck pain  Headache  worsened by an upright position  New onset weakness or numbness of an extremity below the injection site  Hives or difficulty breathing (go to the emergency room)  Inflammation or drainage at the injection site(s)  Severe back/neck pain greater than usual  New symptoms which are concerning to you  Please note:  Although the local anesthetic injected can often make your back or neck feel good for several hours after the injection the pain will  likely return.  It takes 3-5 days for steroids to work on the nerve root. You may not notice any pain relief for at least one week.  If effective, we will often do a series of 3 injections spaced 3-6 weeks apart to maximally decrease your pain.    If you have any questions, please call [phone] [facility] Dis[facility] Inst[facility]  General Discharge Instructions :  If you need to reach your doctor call: Monday-Friday 8:00 am - 4:00 pm at [phone] or toll free [phone].  After clinic hours [phone] to have operator reach doctor.  Bring all of your medication bottles to all your appointments in the pain clinic.  To cancel or reschedule your appointment with Pain Management please remember to call 24 hours in advance to avoid a fee.  Refer to the educational materials which you have been given on: General Risks, I had my Procedure. Discharge Instructions, Post Sedation.  Post Procedure Instructions:  The drugs you were given will stay in your system until tomorrow, so for the next 24 hours you should not drive, make any legal decisions or drink any alcoholic beverages.  You may eat anything you prefer, but it is better to start with liquids then soups and crackers, and gradually work up to solid foods.  Please notify your doctor immediately if you have any unusual bleeding, trouble breathing or pain that is not related to your normal pain.  Depending on the type of procedure  that was done, some parts of your body may feel week and/or numb.  This usually clears up by tonight or the next day.  Walk with the use of an assistive device or accompanied by an adult for the 24 hours.  You may use ice on the affected area for the first 24 hours.  Put ice in a Ziploc bag and cover with a towel and place against area 15 minutes on 15 minutes off.  You may switch to heat after 24 hours.

## 2016-06-03 ENCOUNTER — Encounter: Admitting: Pain Medicine

## 2016-06-03 ENCOUNTER — Encounter: Payer: Self-pay | Admitting: Pain Medicine

## 2016-06-03 ENCOUNTER — Ambulatory Visit: Attending: Pain Medicine | Admitting: Pain Medicine

## 2016-06-03 VITALS — BP 152/69 | HR 80 | Temp 97.2°F | Resp 14 | Ht 63.0 in | Wt 154.0 lb

## 2016-06-03 DIAGNOSIS — G90523 Complex regional pain syndrome I of lower limb, bilateral: Secondary | ICD-10-CM

## 2016-06-03 DIAGNOSIS — M79604 Pain in right leg: Secondary | ICD-10-CM | POA: Diagnosis present

## 2016-06-03 DIAGNOSIS — Z9889 Other specified postprocedural states: Secondary | ICD-10-CM | POA: Insufficient documentation

## 2016-06-03 DIAGNOSIS — M47816 Spondylosis without myelopathy or radiculopathy, lumbar region: Secondary | ICD-10-CM

## 2016-06-03 DIAGNOSIS — M5481 Occipital neuralgia: Secondary | ICD-10-CM

## 2016-06-03 DIAGNOSIS — M797 Fibromyalgia: Secondary | ICD-10-CM

## 2016-06-03 DIAGNOSIS — M961 Postlaminectomy syndrome, not elsewhere classified: Secondary | ICD-10-CM

## 2016-06-03 DIAGNOSIS — M79605 Pain in left leg: Secondary | ICD-10-CM | POA: Diagnosis present

## 2016-06-03 DIAGNOSIS — M5030 Other cervical disc degeneration, unspecified cervical region: Secondary | ICD-10-CM

## 2016-06-03 DIAGNOSIS — M5137 Other intervertebral disc degeneration, lumbosacral region: Secondary | ICD-10-CM

## 2016-06-03 MED ORDER — CEFAZOLIN IN D5W 1 GM/50ML IV SOLN
1.0000 g | Freq: Once | INTRAVENOUS | Status: AC
  Administered 2016-06-03: 1 g via INTRAVENOUS

## 2016-06-03 MED ORDER — LIDOCAINE HCL (PF) 1 % IJ SOLN
10.0000 mL | Freq: Once | INTRAMUSCULAR | Status: AC
  Administered 2016-06-03: 10 mL via SUBCUTANEOUS

## 2016-06-03 MED ORDER — ORPHENADRINE CITRATE 30 MG/ML IJ SOLN
60.0000 mg | Freq: Once | INTRAMUSCULAR | Status: AC
  Administered 2016-06-03: 60 mg via INTRAMUSCULAR

## 2016-06-03 MED ORDER — FENTANYL CITRATE (PF) 100 MCG/2ML IJ SOLN
100.0000 ug | Freq: Once | INTRAMUSCULAR | Status: AC
  Administered 2016-06-03: 100 ug via INTRAVENOUS

## 2016-06-03 MED ORDER — MIDAZOLAM HCL 5 MG/5ML IJ SOLN
5.0000 mg | Freq: Once | INTRAMUSCULAR | Status: AC
  Administered 2016-06-03: 5 mg via INTRAVENOUS

## 2016-06-03 MED ORDER — LACTATED RINGERS IV SOLN
1000.0000 mL | INTRAVENOUS | Status: DC
Start: 2016-06-03 — End: 2016-11-29
  Administered 2016-06-03: 1000 mL via INTRAVENOUS

## 2016-06-03 MED ORDER — BUPIVACAINE HCL (PF) 0.25 % IJ SOLN
30.0000 mL | Freq: Once | INTRAMUSCULAR | Status: AC
  Administered 2016-06-03: 30 mL

## 2016-06-03 MED ORDER — CEFUROXIME AXETIL 250 MG PO TABS: 250.0000 mg | ORAL_TABLET | Freq: Two times a day (BID) | ORAL | 0 refills | Status: DC

## 2016-06-03 NOTE — Patient Instructions (Addendum)
PLAN   Continue present medication Lyrica and hydrocodone and begin taking antibiotics Ceftin today as prescribed  F/U PCP Dr.Olmedo  for evaliation of  BP and general medical  condition  F/U surgical evaluation. Neurosurgical evaluation as planned  F/U neurological evaluation. May consider [facility] EMG studies and other studies pending follow-up evaluations  May consider radiofrequency rhizolysis or intraspinal procedures pending response to present treatment and F/U evaluation   Patient to call Pain Management Center should patient have concerns prior to scheduled return appointmentPain Management Discharge Instructions  General Discharge Instructions :  If you need to reach your doctor call: Monday-Friday 8:00 am - 4:00 pm at [phone] or toll free [phone].  After clinic hours [phone] to have operator reach doctor.  Bring all of your medication bottles to all your appointments in the pain clinic.  To cancel or reschedule your appointment with Pain Management please remember to call 24 hours in advance to avoid a fee.  Refer to the educational materials which you have been given on: General Risks, I had my Procedure. Discharge Instructions, Post Sedation.  Post Procedure Instructions:  The drugs you were given will stay in your system until tomorrow, so for the next 24 hours you should not drive, make any legal decisions or drink any alcoholic beverages.  You may eat anything you prefer, but it is better to start with liquids then soups and crackers, and gradually work up to solid foods.  Please notify your doctor immediately if you have any unusual bleeding, trouble breathing or pain that is not related to your normal pain.  Depending on the type of procedure that was done, some parts of your body may feel week and/or numb.  This usually clears up by tonight or the next day.  Walk with the use of an assistive device or accompanied by an adult for the 24 hours.  You  may use ice on the affected area for the first 24 hours.  Put ice in a Ziploc bag and cover with a towel and place against area 15 minutes on 15 minutes off.  You may switch to heat after 24 hours.GENERAL RISKS AND COMPLICATIONS  What are the risk, side effects and possible complications? Generally speaking, most procedures are safe.  However, with any procedure there are risks, side effects, and the possibility of complications.  The risks and complications are dependent upon the sites that are lesioned, or the type of nerve block to be performed.  The closer the procedure is to the spine, the more serious the risks are.  Great care is taken when placing the radio frequency needles, block needles or lesioning probes, but sometimes complications can occur. 1. Infection: Any time there is an injection through the skin, there is a risk of infection.  This is why sterile conditions are used for these blocks.  There are four possible types of infection. 1. Localized skin infection. 2. Central Nervous System Infection-This can be in the form of Meningitis, which can be deadly. 3. Epidural Infections-This can be in the form of an epidural abscess, which can cause pressure inside of the spine, causing compression of the spinal cord with subsequent paralysis. This would require an emergency surgery to decompress, and there are no guarantees that the patient would recover from the paralysis. 4. Discitis-This is an infection of the intervertebral discs.  It occurs in about 1% of discography procedures.  It is difficult to treat and it may lead to surgery.  2. Pain: the needles have to go through skin and soft tissues, will cause soreness.       3. Damage to internal structures:  The nerves to be lesioned may be near blood vessels or    other nerves which can be potentially damaged.       4. Bleeding: Bleeding is more common if the patient is taking blood thinners such as  aspirin, Coumadin, Ticiid, Plavix,  etc., or if he/she have some genetic predisposition  such as hemophilia. Bleeding into the spinal canal can cause compression of the spinal  cord with subsequent paralysis.  This would require an emergency surgery to  decompress and there are no guarantees that the patient would recover from the  paralysis.       5. Pneumothorax:  Puncturing of a lung is a possibility, every time a needle is introduced in  the area of the chest or upper back.  Pneumothorax refers to free air around the  collapsed lung(s), inside of the thoracic cavity (chest cavity).  Another two possible  complications related to a similar event would include: Hemothorax and Chylothorax.   These are variations of the Pneumothorax, where instead of air around the collapsed  lung(s), you may have blood or chyle, respectively.       6. Spinal headaches: They may occur with any procedures in the area of the spine.       7. Persistent CSF (Cerebro-Spinal Fluid) leakage: This is a rare problem, but may occur  with prolonged intrathecal or epidural catheters either due to the formation of a fistulous  track or a dural tear.       8. Nerve damage: By working so close to the spinal cord, there is always a possibility of  nerve damage, which could be as serious as a permanent spinal cord injury with  paralysis.       9. Death:  Although rare, severe deadly allergic reactions known as "Anaphylactic  reaction" can occur to any of the medications used.      10. Worsening of the symptoms:  We can always make thing worse.  What are the chances of something like this happening? Chances of any of this occuring are extremely low.  By statistics, you have more of a chance of getting killed in a motor vehicle accident: while driving to the hospital than any of the above occurring .  Nevertheless, you should be aware that they are possibilities.  In general, it is similar to taking a shower.  Everybody knows that you can slip, hit your head and get killed.   Does that mean that you should not shower again?  Nevertheless always keep in mind that statistics do not mean anything if you happen to be on the wrong side of them.  Even if a procedure has a 1 (one) in a 1,000,000 (million) chance of going wrong, it you happen to be that one..Also, keep in mind that by statistics, you have more of a chance of having something go wrong when taking medications.  Who should not have this procedure? If you are on a blood thinning medication (e.g. Coumadin, Plavix, see list of "Blood Thinners"), or if you have an active infection going on, you should not have the procedure.  If you are taking any blood thinners, please inform your physician.  How should I prepare for this procedure?  Do not eat or drink anything at least six hours prior to the procedure.  Bring a driver  with you .  It cannot be a taxi.  Come accompanied by an adult that can drive you back, and that is strong enough to help you if your legs get weak or numb from the local anesthetic.  Take all of your medicines the morning of the procedure with just enough water to swallow them.  If you have diabetes, make sure that you are scheduled to have your procedure done first thing in the morning, whenever possible.  If you have diabetes, take only half of your insulin dose and notify our nurse that you have done so as soon as you arrive at the clinic.  If you are diabetic, but only take blood sugar pills (oral hypoglycemic), then do not take them on the morning of your procedure.  You may take them after you have had the procedure.  Do not take aspirin or any aspirin-containing medications, at least eleven (11) days prior to the procedure.  They may prolong bleeding.  Wear loose fitting clothing that may be easy to take off and that you would not mind if it got stained with Betadine or blood.  Do not wear any jewelry or perfume  Remove any nail coloring.  It will interfere with some of our monitoring  equipment.  NOTE: Remember that this is not meant to be interpreted as a complete list of all possible complications.  Unforeseen problems may occur.  BLOOD THINNERS The following drugs contain aspirin or other products, which can cause increased bleeding during surgery and should not be taken for 2 weeks prior to and 1 week after surgery.  If you should need take something for relief of minor pain, you may take acetaminophen which is found in Tylenol,m Datril, Anacin-3 and Panadol. It is not blood thinner. The products listed below are.  Do not take any of the products listed below in addition to any listed on your instruction sheet.  A.P.C or A.P.C with Codeine Codeine Phosphate Capsules #3 Ibuprofen Ridaura  ABC compound Congesprin Imuran rimadil  Advil Cope Indocin Robaxisal  Alka-Seltzer Effervescent Pain Reliever and Antacid Coricidin or Coricidin-D  Indomethacin Rufen  Alka-Seltzer plus Cold Medicine Cosprin Ketoprofen S-A-C Tablets  Anacin Analgesic Tablets or Capsules Coumadin Korlgesic Salflex  Anacin Extra Strength Analgesic tablets or capsules CP-2 Tablets Lanoril Salicylate  Anaprox Cuprimine Capsules Levenox Salocol  Anexsia-D Dalteparin Magan Salsalate  Anodynos Darvon compound Magnesium Salicylate Sine-off  Ansaid Dasin Capsules Magsal Sodium Salicylate  Anturane Depen Capsules Marnal Soma  APF Arthritis pain formula Dewitt's Pills Measurin Stanback  Argesic Dia-Gesic Meclofenamic Sulfinpyrazone  Arthritis Bayer Timed Release Aspirin Diclofenac Meclomen Sulindac  Arthritis pain formula Anacin Dicumarol Medipren Supac  Analgesic (Safety coated) Arthralgen Diffunasal Mefanamic Suprofen  Arthritis Strength Bufferin Dihydrocodeine Mepro Compound Suprol  Arthropan liquid Dopirydamole Methcarbomol with Aspirin Synalgos  ASA tablets/Enseals Disalcid Micrainin Tagament  Ascriptin Doan's Midol Talwin  Ascriptin A/D Dolene Mobidin Tanderil  Ascriptin Extra Strength Dolobid  Moblgesic Ticlid  Ascriptin with Codeine Doloprin or Doloprin with Codeine Momentum Tolectin  Asperbuf Duoprin Mono-gesic Trendar  Aspergum Duradyne Motrin or Motrin IB Triminicin  Aspirin plain, buffered or enteric coated Durasal Myochrisine Trigesic  Aspirin Suppositories Easprin Nalfon Trillsate  Aspirin with Codeine Ecotrin Regular or Extra Strength Naprosyn Uracel  Atromid-S Efficin Naproxen Ursinus  Auranofin Capsules Elmiron Neocylate Vanquish  Axotal Emagrin Norgesic Verin  Azathioprine Empirin or Empirin with Codeine Normiflo Vitamin E  Azolid Emprazil Nuprin Voltaren  Bayer Aspirin plain, buffered or children's or timed BC Tablets or powders  Encaprin Orgaran Warfarin Sodium  Buff-a-Comp Enoxaparin Orudis Zorpin  Buff-a-Comp with Codeine Equegesic Os-Cal-Gesic   Buffaprin Excedrin plain, buffered or Extra Strength Oxalid   Bufferin Arthritis Strength Feldene Oxphenbutazone   Bufferin plain or Extra Strength Feldene Capsules Oxycodone with Aspirin   Bufferin with Codeine Fenoprofen Fenoprofen Pabalate or Pabalate-SF   Buffets II Flogesic Panagesic   Buffinol plain or Extra Strength Florinal or Florinal with Codeine Panwarfarin   Buf-Tabs Flurbiprofen Penicillamine   Butalbital Compound Four-way cold tablets Penicillin   Butazolidin Fragmin Pepto-Bismol   Carbenicillin Geminisyn Percodan   Carna Arthritis Reliever Geopen Persantine   Carprofen Gold's salt Persistin   Chloramphenicol Goody's Phenylbutazone   Chloromycetin Haltrain Piroxlcam   Clmetidine heparin Plaquenil   Cllnoril Hyco-pap Ponstel   Clofibrate Hydroxy chloroquine Propoxyphen         Before stopping any of these medications, be sure to consult the physician who ordered them.  Some, such as Coumadin (Warfarin) are ordered to prevent or treat serious conditions such as "deep thrombosis", "pumonary embolisms", and other heart problems.  The amount of time that you may need off of the medication may also vary with  the medication and the reason for which you were taking it.  If you are taking any of these medications, please make sure you notify your pain physician before you undergo any procedures.

## 2016-06-03 NOTE — Progress Notes (Signed)
Safety precautions to be maintained throughout the outpatient stay will include: orient to surroundings, keep bed in low position, maintain call bell within reach at all times, provide assistance with transfer out of bed and ambulation.  

## 2016-06-03 NOTE — Research (Signed)
PROCEDURE PERFORMED: Left Lumbar sympathetic block.  HISTORY OF PRESENT ILLNESS: The patient is 60 y.o. female who returns to [facility] for further evaluation and treatment of pain involving the lower extremities. The patient is with history of prior surgical intervention of the lumbar region with burning stinging throbbing pain involving the right lower extremity. There is concern regarding the patient's pain being due to complex regional pain syndrome as well as neuralgia of the lower extremity . The risks, benefits, and expectations of the procedure were discussed and explained to the patient who was understanding and wished to proceed with lumbar sympathetic block  as planned.   DESCRIPTION OF PROCEDURE: Lumbar sympathetic block with IV Versed, IV fentanyl conscious sedation, EKG, blood pressure, pulse, capnography, and pulse oximetry monitoring. The procedure was performed with the patient in prone position under fluoroscopic guidance.   NEEDLE PLACEMENT AT L2, left side lumbar sympathetic block: With the patient in prone position and oblique orientation of 20 degrees, Betadine prep and local anesthetic skin wheal of 1.5% lidocaine plain was prepared at the proposed needle entry site. Under fluoroscopic guidance with 20 degrees oblique orientation, the 22 -gauge needle was inserted at the lateral border of the L2 vertebral body on the left side.   NEEDLE PLACEMENT AT L3, left side lumbar sympathetic block: With the patient in the prone position and oblique orientation of 20 degrees, Betadine prep and local anesthetic skin wheal of 1.5% lidocaine plain was prepared at the proposed needle entry site. Under fluoroscopic guidance with 20 degrees  oblique orientation, the 22 -gauge needle was inserted at the lateral border of the L3 vertebral body on the left side.   NEEDLE PLACEMENT AT L4, left side lumbar sympathetic block: With the patient in prone position and oblique orientation  of 20 degrees, Betadine prep and local anesthetic skin wheal of 1.5% lidocaine plain was prepared at the proposed needle entry site. Under fluoroscopic guidance with 20 degrees  oblique orientation, the 22 -gauge needle was inserted at the lateral border of the L4 vertebral body on the left side.    Following needle placement at the L2, L3 and L4 vertebral body levels on the left side, needle placement was then verified on lateral view with tip of the needle documented to be in the anterior third of the vertebral body of L2, L3 and L4 respectively. Following negative aspiration of each needle for heme and CSF, L2 vertebral body level needle was injected with 10 mL of 0.25% bupivacaine. L3 vertebral body level needle was injected with 10 mL of 0.25% bupivacaine. L4 vertebral body level was injected with 10 mL of 0.25% bupivacaine. Needles were removed. Please note temperature readings prior to lumbar sympathetic block were noted to be 82 degrees Fahrenheit and following completion of the lumbar sympathetic block temperature readings of the lower extremity were noted to be 90 degrees Fahrenheit. The patient tolerated the procedure well.  PLAN:   1. Medications: We will continue presently prescribed medications Lyrica and hydrocodone acetaminophen at this time. 2. The patient is to follow-up with primary care physician Dr. Kym Groom for further evaluation of blood pressure and general medical condition as discussed. 3. Surgical evaluation as discussed. 4. Neurological evaluation as discussed. 5. The patient may be candidate for radiofrequency procedures, implantation devices, and other treatment pending response to treatment and follow-up evaluation. 6. The patient has been advised to adhere to proper body mechanics. 7. The patient has been advised to call the Pain Management  Center prior to scheduled return appointment should there be significant change in condition or have other concerns regarding condition  prior to scheduled return appointment.   The patient was understanding and in agreement with suggested treatment plan.

## 2016-06-04 ENCOUNTER — Telehealth: Payer: Self-pay | Admitting: *Deleted

## 2016-06-04 NOTE — Telephone Encounter (Signed)
No problems post procedure. 

## 2016-06-05 ENCOUNTER — Ambulatory Visit (INDEPENDENT_AMBULATORY_CARE_PROVIDER_SITE_OTHER): Admitting: Psychiatry

## 2016-06-05 ENCOUNTER — Other Ambulatory Visit: Payer: Self-pay | Admitting: Cardiovascular Disease

## 2016-06-05 ENCOUNTER — Encounter: Payer: Self-pay | Admitting: Psychiatry

## 2016-06-05 VITALS — BP 160/93 | HR 81 | Temp 98.8°F | Ht 63.0 in | Wt 156.6 lb

## 2016-06-05 DIAGNOSIS — F3130 Bipolar disorder, current episode depressed, mild or moderate severity, unspecified: Secondary | ICD-10-CM | POA: Diagnosis not present

## 2016-06-05 DIAGNOSIS — F411 Generalized anxiety disorder: Secondary | ICD-10-CM | POA: Diagnosis not present

## 2016-06-05 MED ORDER — ALPRAZOLAM 0.5 MG PO TABS: 0.5000 mg | ORAL_TABLET | Freq: Every evening | ORAL | 2 refills | Status: DC | PRN

## 2016-06-05 MED ORDER — VILAZODONE HCL 40 MG PO TABS: 40.0000 mg | ORAL_TABLET | Freq: Every day | ORAL | 2 refills | Status: DC

## 2016-06-05 MED ORDER — LITHIUM CARBONATE ER 450 MG PO TBCR: 450.0000 mg | EXTENDED_RELEASE_TABLET | Freq: Every day | ORAL | 2 refills | Status: DC

## 2016-06-05 MED ORDER — ZOLPIDEM TARTRATE 5 MG PO TABS: 5.0000 mg | ORAL_TABLET | Freq: Every day | ORAL | 2 refills | Status: DC

## 2016-06-05 MED ORDER — QUETIAPINE FUMARATE 300 MG PO TABS: ORAL_TABLET | ORAL | 2 refills | Status: DC

## 2016-06-05 NOTE — Progress Notes (Signed)
BH MD/PA/NP OP Progress Note  06/05/2016 1:08 PM Jennifer [name]  MRN:  [id number]  Subjective:    Patient is a 60 year-old married female who presented for the follow-up appointment. She reported she has decided to keep her son with them. She reported that he is trying to keep himself situated. Patient is to spend time with her grandchildren who are 61 years old and 5 months old. She reported that she is also getting her pain medications from Dr. Gerald Stabs and has been getting injections on a regular basis. She stated that the medications are helping her. Her diabetes is not under control and she's been managing it more carefully. She currently denied having any side effects of her medications. She reported that she sleeps well with the help of Seroquel. We discuss about the adverse effects of Seroquel in using metabolic syndrome and increase the blood sugar. However she does not want to change the medication at this time. She also takes Xanax on a when necessary basis. She sleeps well with the help of the Ambien as well. She denied having any suicidal homicidal ideations or plans. She appeared calm and collective during the interview.      Chief Complaint:  Chief Complaint    Follow-up; Medication Refill     Visit Diagnosis:     ICD-9-CM ICD-10-CM   1. Bipolar I disorder, most recent episode depressed ([place]) 296.50 F31.30   2. Generalized anxiety disorder 300.02 F41.1     Past Medical History:  Past Medical History:  Diagnosis Date  . Anxiety   . Bipolar depression ([place])   . CAD (coronary artery disease)   . 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 Depression   . Depression with anxiety   . Diabetes mellitus   . Enlarged liver   . Generalized anxiety disorder   . GERD (gastroesophageal reflux disease)   . Hyperlipidemia   . Hypertension   . Low back pain   . Migraines   . Neuropathy ([place])   . Persistent disorder of initiating or maintaining sleep   . Von Willebrand disease  ([place])     Past Surgical History:  Procedure Laterality Date  . ABDOMINAL HYSTERECTOMY    . BACK SURGERY    . BREAST IMPLANT REMOVAL    . CARDIAC CATHETERIZATION     CAD,PCI of LAD, Cypher stent 2.5-28mm  . CARDIAC CATHETERIZATION     PCI of mid-LAD in stent restenosis with a drug-eluting stent  . CARDIAC CATHETERIZATION  06/03/13   [facility];MID LAD 100 % STENOSIS; PRIOR STENT; MID RCA 40 % STENOSIS  . CORONARY ARTERY BYPASS GRAFT   08-12-2011   CABG x 3  . CORONARY STENT PLACEMENT    . LUMBAR FUSION     L1-S1   Family History:  Family History  Problem Relation Age of Onset  . Aneurysm Father     abdominal  . Diabetes Father   . Alcohol abuse Father   . Hypertension Father   . Aneurysm Brother     abdominal  . Alcohol abuse Brother   . Hypertension Brother   . Diabetes Mother   . Alcohol abuse Mother   . Hypertension Other     family hx  . Hyperlipidemia Other     family hx  . Diabetes Other     family hx   Social History:  Social History   Social History  . Marital status: Married    Spouse name: N/A  .  Number of children: N/A  . Years of education: N/A   Social History Main Topics  . Smoking status: Former Smoker    Years: 16.00    Types: Cigarettes    Quit date: 06/20/2010  . Smokeless tobacco: Never Used  . Alcohol use No  . Drug use: No  . Sexual activity: Yes   Other Topics Concern  . None   Social History Narrative  . None   Additional History:  Lives with her family members and has good relationship with them.  Assessment:   Musculoskeletal: Strength & Muscle Tone: within normal limits Gait & Station: normal Patient leans: N/A  Psychiatric Specialty Exam: Depression         Associated symptoms include insomnia and myalgias.  Associated symptoms include no suicidal ideas. Medication Refill  Associated symptoms include myalgias. Pertinent negatives include no nausea.    Review of Systems  Constitutional: Positive for malaise/fatigue.   HENT: Negative.  Negative for nosebleeds and tinnitus.   Eyes: Negative.  Negative for photophobia.  Respiratory: Negative.   Cardiovascular: Negative.  Negative for palpitations.  Gastrointestinal: Negative for diarrhea and nausea.  Genitourinary: Negative for frequency.  Musculoskeletal: Positive for back pain and myalgias.  Skin: Negative.   Neurological: Negative for tremors.  Endo/Heme/Allergies: Negative for environmental allergies.  Psychiatric/Behavioral: Positive for depression. Negative for substance abuse and suicidal ideas. The patient is nervous/anxious and has insomnia.   All other systems reviewed and are negative.   Blood pressure (!) 160/93, pulse 81, temperature 98.8 F (37.1 C), temperature source Oral, height '5\' 3"'  (1.6 m), weight 156 lb 9.6 oz (71 kg).Body mass index is 27.74 kg/m.  General Appearance: Casual  Eye Contact:  Fair  Speech:  Slow  Volume:  Decreased  Mood:  Anxious and Depressed  Affect:  Congruent  Thought Process:  Logical  Orientation:  Full (Time, Place, and Person)  Thought Content:  WDL  Suicidal Thoughts:  No  Homicidal Thoughts:  No  Memory:  Immediate;   Fair  Judgement:  Fair  Insight:  Fair  Psychomotor Activity:  Normal  Concentration:  Fair  Recall:  [employer] of Knowledge: Fair  Language: Fair  Akathisia:  No  Handed:  Right  AIMS (if indicated):    Assets:  Communication Skills Desire for Improvement Housing Social Support  ADL's:  Intact  Cognition: WNL  Sleep:  good   Is the patient at risk to self?  No. Has the patient been a risk to self in the past 6 months?  No. Has the patient been a risk to self within the distant past?  Yes.   Is the patient a risk to others?  No. Has the patient been a risk to others in the past 6 months?  No. Has the patient been a risk to others within the distant past?  No.  Current Medications: Current Outpatient Prescriptions  Medication Sig Dispense Refill  .  albuterol-ipratropium (COMBIVENT) 18-103 MCG/ACT inhaler Inhale into the lungs as needed for wheezing or shortness of breath.    . ALPRAZolam (XANAX) 0.5 MG tablet Take 1 tablet (0.5 mg total) by mouth at bedtime as needed for anxiety. 30 tablet 2  . amLODipine (NORVASC) 5 MG tablet Take 1 tablet (5 mg total) by mouth 2 (two) times daily. 180 tablet 4  . aspirin (GOODSENSE ASPIRIN) 81 MG chewable tablet Chew 81 mg by mouth daily.     Marland Kitchen atorvastatin (LIPITOR) 80 MG tablet Take 80 mg by mouth  daily.     . Blood Glucose Monitoring Suppl (FREESTYLE FREEDOM LITE) W/DEVICE KIT Use 3 (three) times daily. 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 Dx Code: 250.00    . carisoprodol (SOMA) 350 MG tablet Limit 1 tablet by mouth per day or twice per day if tolerated 50 tablet 0  . carvedilol (COREG) 12.5 MG tablet Take 1 tablet (12.5 mg total) by mouth 2 (two) times daily with a meal. 180 tablet 3  . cefUROXime (CEFTIN) 250 MG tablet Take 1 tablet (250 mg total) by mouth 2 (two) times daily with a meal. 14 tablet 0  . fluticasone (FLONASE) 50 MCG/ACT nasal spray Place into the nose as needed.     Marland Kitchen HUMULIN 70/30 KWIKPEN (70-30) 100 UNIT/ML PEN 17 Units 2 (two) times daily.     Marland Kitchen HYDROcodone-acetaminophen (NORCO) 10-325 MG tablet Limit 3-6 tabs by mouth per day if tolerated 180 tablet 0  . Insulin Isophane & Regular (HUMULIN 70/30 PEN [place]) Inject 27 Units into the skin 2 (two) times daily.     Marland Kitchen lisinopril (PRINIVIL,ZESTRIL) 20 MG tablet TAKE 1 TABLET DAILY 90 tablet 3  . lithium carbonate (ESKALITH) 450 MG CR tablet Take 1 tablet (450 mg total) by mouth at bedtime. 30 tablet 1  . metFORMIN (GLUCOPHAGE) 500 MG tablet Take 1,000 mg by mouth 2 (two) times daily with a meal.    . nitroGLYCERIN (NITROSTAT) 0.4 MG SL tablet Place 1 tablet (0.4 mg total) under the tongue every 5 (five) minutes as needed for chest pain. 25 tablet 6  . omeprazole (PRILOSEC) 20 MG capsule Take 20 mg by mouth daily.     . pregabalin  (LYRICA) 50 MG capsule Limit 1 tablet by mouth per day or twice per day if tolerated 60 capsule 2  . PROAIR HFA 108 (90 Base) MCG/ACT inhaler as needed.     Marland Kitchen QUEtiapine (SEROQUEL) 300 MG tablet Take 1 tablet (300 mg total) by mouth at bedtime. Take 1.5 tablet at bed time. (Patient taking differently: Take 1.5 tablet at bed time.) 135 tablet 2  . RESTASIS 0.05 % ophthalmic emulsion     . Spacer/Aero-Holding Chambers (E-Z SPACER) inhaler Reported on 01/15/2016    . Vilazodone HCl (VIIBRYD) 40 MG TABS Take 1 tablet (40 mg total) by mouth daily. 90 tablet 2  . zolpidem (AMBIEN) 5 MG tablet Take 1 tablet (5 mg total) by mouth at bedtime. 30 tablet 2   Current Facility-Administered Medications  Medication Dose Route Frequency Provider Last Rate Last Dose  . bupivacaine (PF) (MARCAINE) 0.25 % injection 30 mL  30 mL Other Once Mohammed Kindle, MD      . bupivacaine (PF) (MARCAINE) 0.25 % injection 30 mL  30 mL Other Once Mohammed Kindle, MD      . bupivacaine (PF) (MARCAINE) 0.25 % injection 30 mL  30 mL Other Once Mohammed Kindle, MD      . bupivacaine (PF) (MARCAINE) 0.25 % injection 30 mL  30 mL Other Once Mohammed Kindle, MD      . bupivacaine (PF) (MARCAINE) 0.25 % injection 30 mL  30 mL Other Once Mohammed Kindle, MD      . ceFAZolin (ANCEF) IVPB 1 g/50 mL premix  1 g Intravenous Once Mohammed Kindle, MD      . ceFAZolin (ANCEF) IVPB 1 g/50 mL premix  1 g Intravenous Once Mohammed Kindle, MD      . ceFAZolin (ANCEF) IVPB 1 g/50 mL premix  1 g Intravenous  Once Mohammed Kindle, MD      . ceFAZolin (ANCEF) IVPB 1 g/50 mL premix  1 g Intravenous Once Mohammed Kindle, MD      . ceFAZolin (ANCEF) IVPB 1 g/50 mL premix  1 g Intravenous Once Mohammed Kindle, MD      . ceFAZolin (ANCEF) IVPB 1 g/50 mL premix  1 g Intravenous Once Mohammed Kindle, MD      . fentaNYL (SUBLIMAZE) injection 100 mcg  100 mcg Intravenous Once Mohammed Kindle, MD      . fentaNYL (SUBLIMAZE) injection 100 mcg  100 mcg Intravenous Once Mohammed Kindle, MD       . fentaNYL (SUBLIMAZE) injection 100 mcg  100 mcg Intravenous Once Mohammed Kindle, MD      . fentaNYL (SUBLIMAZE) injection 100 mcg  100 mcg Intravenous Once Mohammed Kindle, MD      . fentaNYL (SUBLIMAZE) injection 100 mcg  100 mcg Intravenous Once Mohammed Kindle, MD      . lactated ringers infusion 1,000 mL  1,000 mL Intravenous Continuous Mohammed Kindle, MD      . lactated ringers infusion 1,000 mL  1,000 mL Intravenous Continuous Mohammed Kindle, MD      . lactated ringers infusion 1,000 mL  1,000 mL Intravenous Continuous Mohammed Kindle, MD      . lactated ringers infusion 1,000 mL  1,000 mL Intravenous Continuous Mohammed Kindle, MD      . lactated ringers infusion 1,000 mL  1,000 mL Intravenous Continuous Mohammed Kindle, MD      . lactated ringers infusion 1,000 mL  1,000 mL Intravenous Continuous Mohammed Kindle, MD      . lactated ringers infusion 1,000 mL  1,000 mL Intravenous Continuous Mohammed Kindle, MD 125 mL/hr at 06/03/16 0834 1,000 mL at 06/03/16 0834  . lidocaine (PF) (XYLOCAINE) 1 % injection 10 mL  10 mL Subcutaneous Once Mohammed Kindle, MD      . lidocaine (PF) (XYLOCAINE) 1 % injection 10 mL  10 mL Subcutaneous Once Mohammed Kindle, MD      . lidocaine (PF) (XYLOCAINE) 1 % injection 10 mL  10 mL Subcutaneous Once Mohammed Kindle, MD      . lidocaine (PF) (XYLOCAINE) 1 % injection 10 mL  10 mL Subcutaneous Once Mohammed Kindle, MD      . lidocaine (PF) (XYLOCAINE) 1 % injection 10 mL  10 mL Subcutaneous Once Mohammed Kindle, MD      . midazolam (VERSED) 5 MG/5ML injection 5 mg  5 mg Intravenous Once Mohammed Kindle, MD      . midazolam (VERSED) 5 MG/5ML injection 5 mg  5 mg Intravenous Once Mohammed Kindle, MD      . midazolam (VERSED) 5 MG/5ML injection 5 mg  5 mg Intravenous Once Mohammed Kindle, MD      . midazolam (VERSED) 5 MG/5ML injection 5 mg  5 mg Intravenous Once Mohammed Kindle, MD      . midazolam (VERSED) 5 MG/5ML injection 5 mg  5 mg Intravenous Once Mohammed Kindle, MD      . midazolam  (VERSED) injection 5 mg  5 mg Intravenous Once Mohammed Kindle, MD      . orphenadrine (NORFLEX) injection 60 mg  60 mg Intramuscular Once Mohammed Kindle, MD      . orphenadrine (NORFLEX) injection 60 mg  60 mg Intramuscular Once Mohammed Kindle, MD      . orphenadrine (NORFLEX) injection 60 mg  60 mg Intramuscular Once Mohammed Kindle, MD      .  orphenadrine (NORFLEX) injection 60 mg  60 mg Intramuscular Once Mohammed Kindle, MD      . orphenadrine (NORFLEX) injection 60 mg  60 mg Intramuscular Once Mohammed Kindle, MD      . sodium chloride 0.9 % injection 20 mL  20 mL Other Once Mohammed Kindle, MD      . sodium chloride flush (NS) 0.9 % injection 20 mL  20 mL Other Once Mohammed Kindle, MD      . triamcinolone acetonide (KENALOG-40) injection 40 mg  40 mg Other Once Mohammed Kindle, MD      . triamcinolone acetonide (KENALOG-40) injection 40 mg  40 mg Other Once Mohammed Kindle, MD      . triamcinolone acetonide (KENALOG-40) injection 40 mg  40 mg Other Once Mohammed Kindle, MD      . triamcinolone acetonide (KENALOG-40) injection 40 mg  40 mg Other Once Mohammed Kindle, MD      . triamcinolone acetonide (KENALOG-40) injection 40 mg  40 mg Other Once Mohammed Kindle, MD      . triamcinolone acetonide (KENALOG-40) injection 40 mg  40 mg Other Once Mohammed Kindle, MD      . triamcinolone acetonide (KENALOG-40) injection 40 mg  40 mg Other Once Mohammed Kindle, MD        Medical Decision Making:  Review of Psycho-Social Stressors (1) and Review of Last Therapy Session (1)  Treatment Plan Summary:Medication management   Anxiety Patient will continue on Viibryd 41m po qam and Xanax 0.5 mg po qhs prn as prescribed  Mood symptoms She will continue on Seroquel 300 mg 1-1/2 pill at bedtime. She was given 90 day supply of the medication Patient is also on lithium carbonate and will titrate to 450 mg by mouth daily at bedtime   Insomnia Patient takes Ambien 5 mg at bedtime and discussed with her about the adverse  effects of the medication in detail and she demonstrated understanding  Follow-up She will follow up in 3 months or earlier depending on her symptoms     More than 50% of the time spent in psychoeducation, counseling and coordination of care.  Time spent with the patient 25 minutes   This note was generated in part or whole with voice recognition software. Voice regonition is usually quite accurate but there are transcription errors that can and very often do occur. I apologize for any typographical errors that were not detected and corrected.    URainey Pines MD  06/05/2016, 1:08 PM

## 2016-06-25 ENCOUNTER — Encounter: Payer: Self-pay | Admitting: Pain Medicine

## 2016-06-25 ENCOUNTER — Ambulatory Visit: Attending: Pain Medicine | Admitting: Pain Medicine

## 2016-06-25 VITALS — BP 152/87 | Temp 98.6°F | Resp 18 | Ht 63.0 in | Wt 154.0 lb

## 2016-06-25 DIAGNOSIS — M5030 Other cervical disc degeneration, unspecified cervical region: Secondary | ICD-10-CM

## 2016-06-25 DIAGNOSIS — M5116 Intervertebral disc disorders with radiculopathy, lumbar region: Secondary | ICD-10-CM | POA: Diagnosis not present

## 2016-06-25 DIAGNOSIS — M797 Fibromyalgia: Secondary | ICD-10-CM

## 2016-06-25 DIAGNOSIS — M5137 Other intervertebral disc degeneration, lumbosacral region: Secondary | ICD-10-CM

## 2016-06-25 DIAGNOSIS — Z981 Arthrodesis status: Secondary | ICD-10-CM | POA: Diagnosis not present

## 2016-06-25 DIAGNOSIS — M4806 Spinal stenosis, lumbar region: Secondary | ICD-10-CM | POA: Insufficient documentation

## 2016-06-25 DIAGNOSIS — M5416 Radiculopathy, lumbar region: Secondary | ICD-10-CM

## 2016-06-25 DIAGNOSIS — M47896 Other spondylosis, lumbar region: Secondary | ICD-10-CM | POA: Insufficient documentation

## 2016-06-25 DIAGNOSIS — M62830 Muscle spasm of back: Secondary | ICD-10-CM | POA: Insufficient documentation

## 2016-06-25 DIAGNOSIS — M79606 Pain in leg, unspecified: Secondary | ICD-10-CM | POA: Diagnosis present

## 2016-06-25 DIAGNOSIS — M533 Sacrococcygeal disorders, not elsewhere classified: Secondary | ICD-10-CM | POA: Diagnosis not present

## 2016-06-25 DIAGNOSIS — M545 Low back pain: Secondary | ICD-10-CM | POA: Diagnosis present

## 2016-06-25 DIAGNOSIS — I739 Peripheral vascular disease, unspecified: Secondary | ICD-10-CM | POA: Insufficient documentation

## 2016-06-25 DIAGNOSIS — G90523 Complex regional pain syndrome I of lower limb, bilateral: Secondary | ICD-10-CM

## 2016-06-25 MED ORDER — HYDROCODONE-ACETAMINOPHEN 10-325 MG PO TABS: ORAL_TABLET | ORAL | 0 refills | Status: DC

## 2016-06-25 MED ORDER — CARISOPRODOL 350 MG PO TABS: ORAL_TABLET | ORAL | 0 refills | Status: DC

## 2016-06-25 NOTE — Patient Instructions (Addendum)
PLAN   Continue present medication Lyrica Soma and hydrocodone acetaminophen No Xanax pleas  F/U PCP Dr.Olmedo  for evaliation of  BP vascular evaluation and general medical  condition  F/U surgical evaluation. Neurosurgical evaluation as planned and as discussed Please ask secretaries if you have been scheduled for your neurosurgical evaluation of the lower back and lower extremities as we discussed again today Wear wrist splint for carpal tunnel syndrome as discussed  TENS unit  use as discussed  Vascular evaluation of lower extremities  Please ask Shatoya or other secretary the date of your PNCV/EMG studies of the lower extremities with Dr. Melrose Nakayama  F/U neurological evaluation for PNCV/EMG studies with Dr. Melrose Nakayama as discussed  May consider radiofrequency rhizolysis or intraspinal procedures pending response to present treatment and F/U evaluation   Patient to call [facility] should patient have concerns prior to scheduled return appointment

## 2016-06-25 NOTE — Progress Notes (Signed)
Safety precautions to be maintained throughout the outpatient stay will include: orient to surroundings, keep bed in low position, maintain call bell within reach at all times, provide assistance with transfer out of bed and ambulation.  

## 2016-06-25 NOTE — Progress Notes (Signed)
The patient is a 60 year old female who returns to pain management for further evaluation and treatment of pain involving the lumbar lower extremity region. The patient is with prior surgical intervention of the lumbar region and has pain radiating to the right lower extremity greater than the left lower extremity. The patient has had significant improvement of pain following lumbosacral selective nerve root blocks and other treatment in pain management. The patient denies any trauma change in events of daily living the call significant change in symptomatology. We discussed patient's condition at the present time we will continue to observe response to Lyrica Soma and hydrocodone acetaminophen. The patient will undergo neurosurgical reevaluation at this time. We will remain available to consider interventional treatment pending surgical disposition. All agreed with suggested treatment plan. The patient stated the pain at aggravated by standing walking and becomes more intense as the day progresses. Pain also interferes with ability to obtain restful sleep.     Physical examination   There was tenderness to palpation of paraspinal muscular treat and cervical region cervical facet region a mild degree with mild tenderness of the splenius capitis and occipitalis regions. The patient appeared to be with bilaterally equal grip strength without increased pain with Tinel and Phalen's maneuver. Able to perform drop test with mild to moderate difficulty. Palpation over the region of the thoracic region was with evidence of muscle spasm of moderate degree without crepitus of the thoracic region noted. Palpation over the region of the lumbar region was of increased pain with lateral bending rotation extension and palpation of the lumbar facets reproducing moderate discomfort. Straight leg raise was tolerates approximately 20 with questionably increased pain with dorsiflexion noted. DTRs were difficult to  elicit patient had difficulty relaxing. EHL strength appeared to be decreased. There was negative clonus negative Homans. Palpation of the PSIS and PII S region reproduced moderate discomfort with mild tenderness of the greater trochanteric region iliotibial band region. The knees were with negative anterior and posterior drawer signs without ballottement of the patella. No sensory deficit or dermatomal distribution detected. Abdomen was nontender with no costovertebral angle tenderness noted.      Assessment   Degenerative disc disease lumbar spine  MRI revealed degenerative changes with prior surgical intervention lumbar region, L5-S1 posterior lumbar interbody fusion, 10 x 12 mm right facet intraspinal synovial cyst, mass effect upon the right lateral thecal sac and mild medial displacement of the S1 nerve root as it abutting the foraminal portion of the L5 nerve root. L4-L5 broad-based disc bulge with moderate bilateral facet arthropathy and mild right foraminal stenosis   Lumbar stenosis with neurogenic claudication  Lumbar radiculopathy  Lumbar facet syndrome   Peripheral vascular disease  Neuropathy  Sacroiliac joint dysfunction     PLAN   Continue present medication Lyrica Soma and hydrocodone acetaminophen No Xanax pleas  F/U PCP Dr.Olmedo  for evaliation of  BP vascular evaluation and general medical  condition  F/U surgical evaluation. Neurosurgical evaluation as planned and as discussed Please ask secretaries if you have been scheduled for your neurosurgical evaluation of the lower back and lower extremities as we discussed again today Wear wrist splint for carpal tunnel syndrome as discussed  TENS unit  use as discussed  Vascular evaluation of lower extremities  Please ask Shatoya or other secretary the date of your PNCV/EMG studies of the lower extremities with Dr. Melrose Nakayama  F/U neurological evaluation for PNCV/EMG studies with Dr. Melrose Nakayama as discussed  May  consider radiofrequency  rhizolysis or intraspinal procedures pending response to present treatment and F/U evaluation   Patient to call Pain Management Center should patient have concerns prior to scheduled return appointment

## 2016-07-05 ENCOUNTER — Telehealth: Payer: Self-pay | Admitting: *Deleted

## 2016-07-25 ENCOUNTER — Encounter: Admitting: Pain Medicine

## 2016-08-21 ENCOUNTER — Encounter: Admitting: Pain Medicine

## 2016-08-22 ENCOUNTER — Encounter: Admitting: Pain Medicine

## 2016-09-05 ENCOUNTER — Ambulatory Visit: Admitting: Psychiatry

## 2016-09-06 ENCOUNTER — Other Ambulatory Visit: Payer: Self-pay | Admitting: Neurological Surgery

## 2016-09-06 DIAGNOSIS — S32009K Unspecified fracture of unspecified lumbar vertebra, subsequent encounter for fracture with nonunion: Secondary | ICD-10-CM

## 2016-09-08 ENCOUNTER — Other Ambulatory Visit: Payer: Self-pay | Admitting: Pain Medicine

## 2016-09-16 ENCOUNTER — Ambulatory Visit
Admission: RE | Admit: 2016-09-16 | Discharge: 2016-09-16 | Disposition: A | Discharge status: Home / Self Care | Source: Ambulatory Visit | Attending: Neurological Surgery | Admitting: Neurological Surgery

## 2016-09-16 DIAGNOSIS — S32009K Unspecified fracture of unspecified lumbar vertebra, subsequent encounter for fracture with nonunion: Secondary | ICD-10-CM

## 2016-09-24 ENCOUNTER — Ambulatory Visit: Admitting: Psychiatry

## 2016-09-27 ENCOUNTER — Ambulatory Visit: Admitting: Psychiatry

## 2016-10-03 ENCOUNTER — Other Ambulatory Visit: Payer: Self-pay | Admitting: Neurological Surgery

## 2016-10-04 ENCOUNTER — Ambulatory Visit (INDEPENDENT_AMBULATORY_CARE_PROVIDER_SITE_OTHER): Admitting: Psychiatry

## 2016-10-04 ENCOUNTER — Telehealth: Payer: Self-pay | Admitting: Cardiovascular Disease

## 2016-10-04 DIAGNOSIS — F411 Generalized anxiety disorder: Secondary | ICD-10-CM

## 2016-10-04 DIAGNOSIS — F3130 Bipolar disorder, current episode depressed, mild or moderate severity, unspecified: Secondary | ICD-10-CM | POA: Diagnosis not present

## 2016-10-04 MED ORDER — ZOLPIDEM TARTRATE 5 MG PO TABS: 5.0000 mg | ORAL_TABLET | Freq: Every day | ORAL | 2 refills | Status: DC

## 2016-10-04 MED ORDER — VILAZODONE HCL 40 MG PO TABS: 40.0000 mg | ORAL_TABLET | Freq: Every day | ORAL | 2 refills | Status: DC

## 2016-10-04 MED ORDER — LITHIUM CARBONATE ER 450 MG PO TBCR: 450.0000 mg | EXTENDED_RELEASE_TABLET | Freq: Every day | ORAL | 2 refills | Status: DC

## 2016-10-04 MED ORDER — QUETIAPINE FUMARATE 300 MG PO TABS: ORAL_TABLET | ORAL | 2 refills | Status: DC

## 2016-10-04 MED ORDER — ALPRAZOLAM 0.5 MG PO TABS: 0.5000 mg | ORAL_TABLET | Freq: Every evening | ORAL | 2 refills | Status: DC | PRN

## 2016-10-04 NOTE — Telephone Encounter (Signed)
Received cardiac clearance request for pt to proceed w/ lumbar fusion under general anesthesia on 11/14/16 w/ Dr. Marland Kitchen Ditty. Please route clearance to [place] @ [phone].
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
## 2016-10-06 NOTE — Telephone Encounter (Signed)
Please confirm no change in cardiac condition since last visit If no significant change, Acceptable risk for surgery Negative stress test 04/2016

## 2016-10-07 NOTE — Telephone Encounter (Signed)
Routed to fax # provided. 

## 2016-10-30 ENCOUNTER — Encounter (HOSPITAL_COMMUNITY): Payer: Self-pay

## 2016-10-30 ENCOUNTER — Encounter (HOSPITAL_COMMUNITY)
Admission: RE | Admit: 2016-10-30 | Discharge: 2016-10-30 | Disposition: A | Discharge status: Home / Self Care | Source: Ambulatory Visit | Attending: Neurological Surgery | Admitting: Neurological Surgery

## 2016-10-30 DIAGNOSIS — Z955 Presence of coronary angioplasty implant and graft: Secondary | ICD-10-CM | POA: Diagnosis not present

## 2016-10-30 DIAGNOSIS — Z01818 Encounter for other preprocedural examination: Secondary | ICD-10-CM | POA: Diagnosis present

## 2016-10-30 DIAGNOSIS — E1140 Type 2 diabetes mellitus with diabetic neuropathy, unspecified: Secondary | ICD-10-CM | POA: Insufficient documentation

## 2016-10-30 DIAGNOSIS — Z87891 Personal history of nicotine dependence: Secondary | ICD-10-CM | POA: Diagnosis not present

## 2016-10-30 DIAGNOSIS — I10 Essential (primary) hypertension: Secondary | ICD-10-CM | POA: Diagnosis not present

## 2016-10-30 DIAGNOSIS — K219 Gastro-esophageal reflux disease without esophagitis: Secondary | ICD-10-CM | POA: Diagnosis not present

## 2016-10-30 DIAGNOSIS — F319 Bipolar disorder, unspecified: Secondary | ICD-10-CM | POA: Diagnosis not present

## 2016-10-30 DIAGNOSIS — M960 Pseudarthrosis after fusion or arthrodesis: Secondary | ICD-10-CM | POA: Insufficient documentation

## 2016-10-30 DIAGNOSIS — I2510 Atherosclerotic heart disease of native coronary artery without angina pectoris: Secondary | ICD-10-CM | POA: Insufficient documentation

## 2016-10-30 DIAGNOSIS — Z951 Presence of aortocoronary bypass graft: Secondary | ICD-10-CM | POA: Diagnosis not present

## 2016-10-30 DIAGNOSIS — Z85828 Personal history of other malignant neoplasm of skin: Secondary | ICD-10-CM | POA: Diagnosis not present

## 2016-10-30 DIAGNOSIS — M797 Fibromyalgia: Secondary | ICD-10-CM | POA: Diagnosis not present

## 2016-10-30 DIAGNOSIS — Z794 Long term (current) use of insulin: Secondary | ICD-10-CM | POA: Insufficient documentation

## 2016-10-30 DIAGNOSIS — J449 Chronic obstructive pulmonary disease, unspecified: Secondary | ICD-10-CM | POA: Diagnosis not present

## 2016-10-30 DIAGNOSIS — E785 Hyperlipidemia, unspecified: Secondary | ICD-10-CM | POA: Insufficient documentation

## 2016-10-30 DIAGNOSIS — Z79899 Other long term (current) drug therapy: Secondary | ICD-10-CM | POA: Insufficient documentation

## 2016-10-30 DIAGNOSIS — Z01812 Encounter for preprocedural laboratory examination: Secondary | ICD-10-CM | POA: Insufficient documentation

## 2016-10-30 DIAGNOSIS — D680 Von Willebrand's disease: Secondary | ICD-10-CM | POA: Insufficient documentation

## 2016-10-30 DIAGNOSIS — Z0183 Encounter for blood typing: Secondary | ICD-10-CM | POA: Diagnosis not present

## 2016-10-30 HISTORY — DX: Fibromyalgia: M79.7

## 2016-10-30 HISTORY — DX: Disease of blood and blood-forming organs, unspecified: D75.9

## 2016-10-30 HISTORY — DX: Chronic obstructive pulmonary disease, unspecified: J44.9

## 2016-10-30 HISTORY — DX: Dyspnea, unspecified: R06.00

## 2016-10-30 LAB — CBC
HEMATOCRIT: 41.5 % (ref 36.0–46.0)
HEMOGLOBIN: 14.1 g/dL (ref 12.0–15.0)
MCH: 29.2 pg (ref 26.0–34.0)
MCHC: 34 g/dL (ref 30.0–36.0)
MCV: 85.9 fL (ref 78.0–100.0)
Platelets: 409 10*3/uL — ABNORMAL HIGH (ref 150–400)
RBC: 4.83 MIL/uL (ref 3.87–5.11)
RDW: 14.5 % (ref 11.5–15.5)
WBC: 11.7 10*3/uL — AB (ref 4.0–10.5)

## 2016-10-30 LAB — COMPREHENSIVE METABOLIC PANEL
ALT: 17 U/L (ref 14–54)
AST: 22 U/L (ref 15–41)
Albumin: 5 g/dL (ref 3.5–5.0)
Alkaline Phosphatase: 97 U/L (ref 38–126)
Anion gap: 13 (ref 5–15)
BILIRUBIN TOTAL: 0.6 mg/dL (ref 0.3–1.2)
BUN: 13 mg/dL (ref 6–20)
CALCIUM: 10.7 mg/dL — AB (ref 8.9–10.3)
CO2: 23 mmol/L (ref 22–32)
CREATININE: 0.82 mg/dL (ref 0.44–1.00)
Chloride: 99 mmol/L — ABNORMAL LOW (ref 101–111)
Glucose, Bld: 133 mg/dL — ABNORMAL HIGH (ref 65–99)
Potassium: 4.4 mmol/L (ref 3.5–5.1)
Sodium: 135 mmol/L (ref 135–145)
Total Protein: 8.1 g/dL (ref 6.5–8.1)

## 2016-10-30 LAB — PROTIME-INR
INR: 0.97
PROTHROMBIN TIME: 12.9 s (ref 11.4–15.2)

## 2016-10-30 LAB — TYPE AND SCREEN
ABO/RH(D): O POS
Antibody Screen: NEGATIVE

## 2016-10-30 LAB — SURGICAL PCR SCREEN
MRSA, PCR: NEGATIVE
Staphylococcus aureus: NEGATIVE

## 2016-10-30 LAB — GLUCOSE, CAPILLARY: Glucose-Capillary: 168 mg/dL — ABNORMAL HIGH (ref 65–99)

## 2016-10-30 NOTE — Pre-Procedure Instructions (Signed)
[place]  10/30/2016      [facility] [place], [place] - [place] [id number] [place] [address] Phone: [phone] Fax: [phone]  [place], [place] [address] Phone: [phone] Fax: [phone]  [facility] [place], [place] [address] [place] [address] Phone: [phone] Fax: [phone]    Your procedure is scheduled on   Monday  11/04/16  Report to [facility] Admitting at 530 A.M.  Call this number if you have problems the morning of surgery:  [phone]   Remember:  Do not eat food or drink liquids after midnight.  Take these medicines the morning of surgery with A SIP OF WATER   BREATHING TREATMENTS, ALPRAZOLAM, AMLODIPINE, CARVEDILOL, HYDROCODONE IF NEEDED, OMEPRAZOLE      (STOP ASPIRIN PRODUCTS, NO IBUPROFEN/ ADVIL/ MOTRIN, GOODY POWDERS, BC'S, HERBAL MEDICINES)    How to Manage Your Diabetes Before and After Surgery  Why is it important to control my blood sugar before and after surgery? . Improving blood sugar levels before and after surgery helps healing and can limit problems. . A way of improving blood sugar control is eating a healthy diet by: o  Eating less sugar and carbohydrates o  Increasing activity/exercise o  Talking with your doctor about reaching your blood sugar goals . High blood sugars (greater than 180 mg/dL) can raise your risk of infections and slow your recovery, so you will need to focus on controlling your diabetes during the weeks before surgery. . Make sure that the doctor who takes care of your diabetes knows about your planned surgery including the date and location.  How do I manage my blood sugar before surgery? . Check your blood sugar at least 4 times a day, starting 2 days before surgery, to make sure that the level is not too high or  low. o Check your blood sugar the morning of your surgery when you wake up and every 2 hours until you get to the Short Stay unit. . If your blood sugar is less than 70 mg/dL, you will need to treat for low blood sugar: o Do not take insulin. o Treat a low blood sugar (less than 70 mg/dL) with  cup of clear juice (cranberry or apple), 4 glucose tablets, OR glucose gel. o Recheck blood sugar in 15 minutes after treatment (to make sure it is greater than 70 mg/dL). If your blood sugar is not greater than 70 mg/dL on recheck, call [phone] for further instructions. . Report your blood sugar to the short stay nurse when you get to Short Stay.  . If you are admitted to the hospital after surgery: o Your blood sugar will be checked by the staff and you will probably be given insulin after surgery (instead of oral diabetes medicines) to make sure you have good blood sugar levels. o The goal for blood sugar control after surgery is 80-180 mg/dL.              WHAT DO I DO ABOUT MY DIABETES MEDICATION?   Marland Kitchen Do not take oral diabetes medicines (pills) the morning of surgery.  . THE NIGHT BEFORE SURGERY, take ___14 UNITS ________ units of ______HUMULIN 70/30_____insulin.       Marland Kitchen HE MORNING OF SURGERY, take ____________ units of __________insulin.  . The day of surgery, do  not take other diabetes injectables, including Byetta (exenatide), Bydureon (exenatide ER), Victoza (liraglutide), or Trulicity (dulaglutide).  . If your CBG is greater than 220 mg/dL, you may take  of your sliding scale (correction) dose of insulin.  Other Instructions:          Patient Signature:  Date:   Nurse Signature:  Date:   Reviewed and Endorsed by [facility] Patient Education Committee, August 2015  Do not wear jewelry, make-up or nail polish.  Do not wear lotions, powders, or perfumes, or deoderant.  Do not shave 48 hours prior to surgery.  Men may shave face and neck.  Do not bring valuables  to the hospital.  [facility] is not responsible for any belongings or valuables.  Contacts, dentures or bridgework may not be worn into surgery.  Leave your suitcase in the car.  After surgery it may be brought to your room.  For patients admitted to the hospital, discharge time will be determined by your treatment team.  Patients discharged the day of surgery will not be allowed to drive home.   Name and phone number of your driver:    Special instructions:  [place] - Preparing for Surgery  Before surgery, you can play an important role.  Because skin is not sterile, your skin needs to be as free of germs as possible.  You can reduce the number of germs on you skin by washing with CHG (chlorahexidine gluconate) soap before surgery.  CHG is an antiseptic cleaner which kills germs and bonds with the skin to continue killing germs even after washing.  Please DO NOT use if you have an allergy to CHG or antibacterial soaps.  If your skin becomes reddened/irritated stop using the CHG and inform your nurse when you arrive at Short Stay.  Do not shave (including legs and underarms) for at least 48 hours prior to the first CHG shower.  You may shave your face.  Please follow these instructions carefully:   1.  Shower with CHG Soap the night before surgery and the                                morning of Surgery.  2.  If you choose to wash your hair, wash your hair first as usual with your       normal shampoo.  3.  After you shampoo, rinse your hair and body thoroughly to remove the                      Shampoo.  4.  Use CHG as you would any other liquid soap.  You can apply chg directly       to the skin and wash gently with scrungie or a clean washcloth.  5.  Apply the CHG Soap to your body ONLY FROM THE NECK DOWN.        Do not use on open wounds or open sores.  Avoid contact with your eyes,       ears, mouth and genitals (private parts).  Wash genitals (private parts)       with your normal  soap.  6.  Wash thoroughly, paying special attention to the area where your surgery        will be performed.  7.  Thoroughly rinse your body with warm water from the neck down.  8.  DO NOT shower/wash with your normal soap  after using and rinsing off       the CHG Soap.  9.  Pat yourself dry with a clean towel.            10.  Wear clean pajamas.            11.  Place clean sheets on your bed the night of your first shower and do not        sleep with pets.  Day of Surgery  Do not apply any lotions/deoderants the morning of surgery.  Please wear clean clothes to the hospital/surgery center.    Please read over the following fact sheets that you were given. MRSA Information and Surgical Site Infection Prevention

## 2016-10-31 ENCOUNTER — Encounter (HOSPITAL_COMMUNITY): Payer: Self-pay | Admitting: Vascular Surgery

## 2016-10-31 LAB — HEMOGLOBIN A1C
HEMOGLOBIN A1C: 8.5 % — AB (ref 4.8–5.6)
Mean Plasma Glucose: 197 mg/dL

## 2016-10-31 NOTE — Progress Notes (Addendum)
Anesthesia chart review: Patient is a 61 year old female scheduled for L4-5 anterior lateral lumbar fusion, extension fusion to L4, posterior lateral fusion L4-5, reoperative laminectomy L5-S1, laminectomy L4-5, excision of synovial cyst right L5-S1, left L4-5 on 11/04/16 by Dr. Cyndy Freeze.  History includes former smoker, CAD s/p DES LAD 08/2006 and 05/2010 and s/p CABG (LIMA-LAD, SVG-RCA, SVG-DIAG) 08/12/11, GERD, hyperlipidemia, hypertension, diabetes mellitus type 2, von Willebrand's disease (diagnosed '85 after significant bleeding surgery for ruptured ovarian cyst/ectopic pregnancy and required several transfusions; saw a hematologist at [facility]), migraines, depression, anxiety, bipolar disorder, neuropathy, hepatomegaly, skin cancer, COPD, exertional dyspnea, fibromyalgia, hysterectomy, lumbar fusion '07 [facility]).  PCP is Dr. Kym Groom with [place]. Cardiologist is Dr. Ida Rogue. On 10/16/16 he wrote, "If no significant change, Acceptable risk for surgery. Negative stress test 04/2016."  Meds include albuterol, Combivent, Xanax, amlodipine, Lipitor, Soma, Coreg, Flovent, Flonase, Humulin 70/30, Norco, lisinopril, lithium carbonate, metformin, Lyrica, Seroquel, Requip, Januvia, Ambien, vilazodone.  BP 111/68   Pulse 89   Temp 37.2 C (Oral)   Resp 18   Ht 5\' 3"  (1.6 m)   Wt 151 lb (68.5 kg)   SpO2 100%   BMI 26.75 kg/m    EKG 05/15/16: NSR.  Nuclear stress test 05/23/16: Pharmacological myocardial perfusion imaging study with no significant  ischemia Normal wall motion, EF estimated at 73% No EKG changes concerning for ischemia at peak stress or in recovery. Low risk scan  Echo 11/30/13: Study Conclusions Left ventricle: The cavity size was normal. Wall thickness was increased in a pattern of mild LVH. Systolic function was normal. Wall motion was normal; there were no regional wall motion abnormalities.  Cardiac cath 06/03/13 (scanned under Media tab;  [facility]): Summary:  100% occluded mid LAD at the site of a stent, mild 40% mid RCA disease, saphenous vein graft to the diagonal and LIMA graft to the LAD are patent. Occluded saphenous vein graft to the distal RCA. Numerous catheters used in attempt to engage the SVG to RCA. Very difficult case secondary to difficulty engaging SVG to RCA ostium to visualize. Finally an aortic run off run was performed that showed occluded ostial SVG. No intervention needed. Medical management was recommended.  Preoperative labs noted. Cr 0.82. Ca 10.7. Glucose 133. WBC 11.7, PLT 409, H/H 14.1/41.5. PT/INR 12.9/0.97.  A1c 8.5 on 10/30/16 (was also 8.5 on 10/24/16 which was down from 10.2 on 09/24/16; see Care Everywhere).  I called patient to get more details regarding von Willebrand disease history. As above, she was diagnosed in 1985 after post-operative bleeding. She later had a hysterectomy due to heavy menses. She does not recall the specific type of Von Willebrand disease, but other than her 1985 surgery she does not recall any other significant perioperative bleeding events. She is not aware of having to get DDAVP before subsequent surgeries. However, in review of her CABG anesthesia records it does look like she got a DDAVP infusion (20 mcg over 20 minutes). The surgeon also ordered preoperative p2y12 which was 109 (normal range 194-418). I discussed this with anesthesiologist Dr. Ermalene Postin. He recommended reviewing with Dr. Cyndy Freeze (apparently patient did not list her Von Willibrand disease on this office intake form). I called and spoke with Dr. Cyndy Freeze. He will consider options including perioperative DDAVP or hematology referral (or input).  George [facility] Short Stay Center/Anesthesiology Phone [phone] 10/31/2016 12:58 PM

## 2016-11-04 ENCOUNTER — Encounter (HOSPITAL_COMMUNITY): Admission: RE | Payer: Self-pay | Source: Ambulatory Visit

## 2016-11-04 ENCOUNTER — Inpatient Hospital Stay (HOSPITAL_COMMUNITY): Admission: RE | Admit: 2016-11-04 | Source: Ambulatory Visit | Admitting: Neurological Surgery

## 2016-11-04 SURGERY — ANTERIOR LATERAL LUMBAR FUSION 1 LEVEL
Anesthesia: General

## 2016-11-08 ENCOUNTER — Encounter

## 2016-11-11 ENCOUNTER — Inpatient Hospital Stay

## 2016-11-11 ENCOUNTER — Inpatient Hospital Stay: Attending: Oncology | Admitting: Oncology

## 2016-11-11 ENCOUNTER — Encounter: Payer: Self-pay | Admitting: Oncology

## 2016-11-11 VITALS — BP 129/77 | HR 80 | Temp 98.1°F | Resp 18 | Ht 63.0 in | Wt 161.7 lb

## 2016-11-11 DIAGNOSIS — M797 Fibromyalgia: Secondary | ICD-10-CM

## 2016-11-11 DIAGNOSIS — D6800 Von Willebrand disease, unspecified: Secondary | ICD-10-CM

## 2016-11-11 DIAGNOSIS — E785 Hyperlipidemia, unspecified: Secondary | ICD-10-CM | POA: Diagnosis not present

## 2016-11-11 DIAGNOSIS — M48062 Spinal stenosis, lumbar region with neurogenic claudication: Secondary | ICD-10-CM | POA: Insufficient documentation

## 2016-11-11 DIAGNOSIS — E559 Vitamin D deficiency, unspecified: Secondary | ICD-10-CM | POA: Insufficient documentation

## 2016-11-11 DIAGNOSIS — R42 Dizziness and giddiness: Secondary | ICD-10-CM

## 2016-11-11 DIAGNOSIS — I10 Essential (primary) hypertension: Secondary | ICD-10-CM | POA: Diagnosis not present

## 2016-11-11 DIAGNOSIS — M5030 Other cervical disc degeneration, unspecified cervical region: Secondary | ICD-10-CM | POA: Insufficient documentation

## 2016-11-11 DIAGNOSIS — Z794 Long term (current) use of insulin: Secondary | ICD-10-CM | POA: Diagnosis not present

## 2016-11-11 DIAGNOSIS — F319 Bipolar disorder, unspecified: Secondary | ICD-10-CM | POA: Diagnosis not present

## 2016-11-11 DIAGNOSIS — M5481 Occipital neuralgia: Secondary | ICD-10-CM | POA: Diagnosis not present

## 2016-11-11 DIAGNOSIS — I2510 Atherosclerotic heart disease of native coronary artery without angina pectoris: Secondary | ICD-10-CM | POA: Diagnosis not present

## 2016-11-11 DIAGNOSIS — M5137 Other intervertebral disc degeneration, lumbosacral region: Secondary | ICD-10-CM

## 2016-11-11 DIAGNOSIS — K219 Gastro-esophageal reflux disease without esophagitis: Secondary | ICD-10-CM | POA: Insufficient documentation

## 2016-11-11 DIAGNOSIS — F419 Anxiety disorder, unspecified: Secondary | ICD-10-CM | POA: Insufficient documentation

## 2016-11-11 DIAGNOSIS — G8929 Other chronic pain: Secondary | ICD-10-CM | POA: Diagnosis not present

## 2016-11-11 DIAGNOSIS — F411 Generalized anxiety disorder: Secondary | ICD-10-CM

## 2016-11-11 DIAGNOSIS — R0602 Shortness of breath: Secondary | ICD-10-CM | POA: Diagnosis not present

## 2016-11-11 DIAGNOSIS — M47816 Spondylosis without myelopathy or radiculopathy, lumbar region: Secondary | ICD-10-CM

## 2016-11-11 DIAGNOSIS — M549 Dorsalgia, unspecified: Secondary | ICD-10-CM | POA: Diagnosis not present

## 2016-11-11 DIAGNOSIS — M5416 Radiculopathy, lumbar region: Secondary | ICD-10-CM

## 2016-11-11 DIAGNOSIS — Z85828 Personal history of other malignant neoplasm of skin: Secondary | ICD-10-CM | POA: Insufficient documentation

## 2016-11-11 DIAGNOSIS — Z79899 Other long term (current) drug therapy: Secondary | ICD-10-CM | POA: Insufficient documentation

## 2016-11-11 DIAGNOSIS — M961 Postlaminectomy syndrome, not elsewhere classified: Secondary | ICD-10-CM

## 2016-11-11 DIAGNOSIS — F3132 Bipolar disorder, current episode depressed, moderate: Secondary | ICD-10-CM

## 2016-11-11 DIAGNOSIS — E039 Hypothyroidism, unspecified: Secondary | ICD-10-CM | POA: Diagnosis not present

## 2016-11-11 DIAGNOSIS — G4700 Insomnia, unspecified: Secondary | ICD-10-CM | POA: Insufficient documentation

## 2016-11-11 DIAGNOSIS — E1165 Type 2 diabetes mellitus with hyperglycemia: Secondary | ICD-10-CM | POA: Insufficient documentation

## 2016-11-11 DIAGNOSIS — M5010 Cervical disc disorder with radiculopathy, unspecified cervical region: Secondary | ICD-10-CM

## 2016-11-11 DIAGNOSIS — J449 Chronic obstructive pulmonary disease, unspecified: Secondary | ICD-10-CM | POA: Insufficient documentation

## 2016-11-11 DIAGNOSIS — G5602 Carpal tunnel syndrome, left upper limb: Secondary | ICD-10-CM

## 2016-11-11 DIAGNOSIS — M5117 Intervertebral disc disorders with radiculopathy, lumbosacral region: Secondary | ICD-10-CM | POA: Diagnosis not present

## 2016-11-11 DIAGNOSIS — Z87891 Personal history of nicotine dependence: Secondary | ICD-10-CM

## 2016-11-11 LAB — CBC WITH DIFFERENTIAL/PLATELET
BASOS ABS: 0 10*3/uL (ref 0–0.1)
Basophils Relative: 1 %
Eosinophils Absolute: 0.2 10*3/uL (ref 0–0.7)
Eosinophils Relative: 3 %
HEMATOCRIT: 35.9 % (ref 35.0–47.0)
Hemoglobin: 11.8 g/dL — ABNORMAL LOW (ref 12.0–16.0)
LYMPHS ABS: 2.2 10*3/uL (ref 1.0–3.6)
LYMPHS PCT: 32 %
MCH: 28.8 pg (ref 26.0–34.0)
MCHC: 32.9 g/dL (ref 32.0–36.0)
MCV: 87.6 fL (ref 80.0–100.0)
Monocytes Absolute: 0.4 10*3/uL (ref 0.2–0.9)
Monocytes Relative: 6 %
NEUTROS ABS: 4.1 10*3/uL (ref 1.4–6.5)
Neutrophils Relative %: 58 %
Platelets: 298 10*3/uL (ref 150–440)
RBC: 4.1 MIL/uL (ref 3.80–5.20)
RDW: 14.8 % — ABNORMAL HIGH (ref 11.5–14.5)
WBC: 7 10*3/uL (ref 3.6–11.0)

## 2016-11-11 LAB — PROTIME-INR
INR: 0.97
Prothrombin Time: 12.9 seconds (ref 11.4–15.2)

## 2016-11-11 LAB — APTT: APTT: 30 s (ref 24–36)

## 2016-11-11 NOTE — Progress Notes (Signed)
Hematology/Oncology Consult note [facility] [phone] [phone]  Patient Care Team: Valera Castle, MD as PCP - General Minna Merritts, MD (Cardiology)   Name of the patient: Jennifer [name]  [id number]  06-17-1956    Reason for referral- history of one Willebrand's disease   Referring physician- Dr. Marland Kitchen Ditty  Date of visit: 11/11/16   History of presenting illness- patient is a 61 year old female who was scheduled to undergo back surgery but reported that she had a history of one Willebrand's disease and hence is been referred to [place] or surgical clearance area in patient states that her father had frequent nosebleeds but had not been diagnosed with any formal bleeding disorder. No other know bleeding disorders in the family. Patient had nosebleeds as a child which subsequently resolved. She does get on and off, beats. She also had a history of heavy periods for which she underwent hysterectomy in 1985. Patient reports that she had bleeding complications after that surgery and was diagnosed with von Willebrand's disease and not for [place]. She has never seen a hematologist before. Prior to that she has had 3 vaginal deliveries without any significant bleeding issues area she also had prior back surgery and tooth extraction without any significant bleeding complications. Reports that she bruises easily. She did require blood transfusion after her hysterectomy surgery but has not otherwise had in the past. She has 3 other children and none of them have been diagnosed with any bleeding disorder.   ECOG PS- 1  Pain scale- 3- chronic back pain   Review of systems- Review of Systems  Constitutional: Negative for chills, fever, malaise/fatigue and weight loss.  HENT: Negative for congestion, ear discharge and nosebleeds.   Eyes: Negative for blurred vision.  Respiratory: Negative for cough, hemoptysis, sputum production, shortness  of breath and wheezing.   Cardiovascular: Negative for chest pain, palpitations, orthopnea and claudication.  Gastrointestinal: Negative for abdominal pain, blood in stool, constipation, diarrhea, heartburn, melena, nausea and vomiting.  Genitourinary: Negative for dysuria, flank pain, frequency, hematuria and urgency.  Musculoskeletal: Positive for back pain. Negative for joint pain and myalgias.  Skin: Negative for rash.  Neurological: Negative for dizziness, tingling, focal weakness, seizures, weakness and headaches.  Endo/Heme/Allergies: Bruises/bleeds easily.  Psychiatric/Behavioral: Negative for depression and suicidal ideas. The patient does not have insomnia.     Allergies  Allergen Reactions  . No Known Allergies     Patient Active Problem List   Diagnosis Date Noted  . Complex regional pain syndrome of both lower extremities 06/03/2016  . Hyperlipidemia 06/09/2015  . Spinal stenosis, lumbar region, with neurogenic claudication 05/29/2015  . Lumbar post-laminectomy syndrome 04/20/2015  . Facet syndrome, lumbar 04/20/2015  . Lumbar radiculopathy 03/06/2015  . DDD (degenerative disc disease), lumbosacral 03/05/2015  . DDD (degenerative disc disease), cervical 03/05/2015  . Occipital neuralgia 03/05/2015  . Bilateral occipital neuralgia 03/05/2015  . H/O elevated lipids 03/01/2015  . H/O: HTN (hypertension) 03/01/2015  . H/O: hypothyroidism 03/01/2015  . Insomnia, persistent 03/01/2015  . H/O disease 03/01/2015  . Aggrieved 03/01/2015  . Vitamin D deficiency 03/01/2015  . Anxiety, generalized 03/01/2015  . H/O diabetes mellitus 03/01/2015  . Bipolar 1 disorder, depressed ([place]) 03/01/2015  . Anxiety 03/01/2015  . Fibromyalgia 03/01/2015  . Coronary artery disease 03/01/2015  . Essential (primary) hypertension 09/02/2014  . HLD (hyperlipidemia) 04/19/2014  . Hypertensive urgency 12/15/2013  . Dyspnea 11/24/2013  . Diabetes ([place]) 05/24/2013  . Anxiety and depression  02/17/2013  .  OP (osteoporosis) 08/27/2012  . Persons encountering health services in other specified circumstances 08/27/2012  . Arteriosclerosis of coronary artery 07/15/2012  . Back pain, chronic 07/15/2012  . Diabetes mellitus ([place]) 07/15/2012  . Acid reflux 07/15/2012  . Bipolar affective disorder ([place]) 07/15/2012  . Hypertriglyceridemia 07/15/2012  . Von Willebrand's disease ([place]) 07/15/2012  . Hernia of anterior abdominal wall 07/15/2012  . Dizziness 09/04/2011  . Back pain 09/04/2011  . HTN (hypertension) 01/31/2011  . SMOKER 06/14/2010  . CAD 06/13/2010  . CHEST PAIN UNSPECIFIED 06/13/2010     Past Medical History:  Diagnosis Date  . Anxiety   . Bipolar depression ([place])   . Blood dyscrasia    VON WILLIBRAND  . CAD (coronary artery disease)   . Cancer ([place])    skin cancer on the ear  . COPD (chronic obstructive pulmonary disease) ([place])    dr. Gar P[facility]    PCP  . Depression   . Depression with anxiety   . Diabetes mellitus   . Dyspnea    W/ EXERTION   . Enlarged liver   . Fibromyalgia   . Generalized anxiety disorder   . GERD (gastroesophageal reflux disease)   . Hyperlipidemia   . Hypertension   . Low back pain   . Migraines   . Neuropathy ([place])   . Persistent disorder of initiating or maintaining sleep   . Von Willebrand disease ([place])      Past Surgical History:  Procedure Laterality Date  . ABDOMINAL HYSTERECTOMY    . BACK SURGERY    . BREAST IMPLANT REMOVAL    . CARDIAC CATHETERIZATION     CAD,PCI of LAD, Cypher stent 2.5-28mm  . CARDIAC CATHETERIZATION     PCI of mid-LAD in stent restenosis with a drug-eluting stent  . CARDIAC CATHETERIZATION  06/03/13   [facility];MID LAD 100 % STENOSIS; PRIOR STENT; MID RCA 40 % STENOSIS  . CORONARY ARTERY BYPASS GRAFT   08-12-2011   CABG x 3  . CORONARY STENT PLACEMENT    . LUMBAR FUSION     L1-S1    Social History   Social History  . Marital status: Married    Spouse name: N/A  .  Number of children: N/A  . Years of education: N/A   Occupational History  . Not on file.   Social History Main Topics  . Smoking status: Former Smoker    Years: 16.00    Types: Cigarettes    Quit date: 06/20/2010  . Smokeless tobacco: Never Used  . Alcohol use No  . Drug use: No  . Sexual activity: Yes   Other Topics Concern  . Not on file   Social History Narrative  . No narrative on file     Family History  Problem Relation Age of Onset  . Aneurysm Father     abdominal  . Diabetes Father   . Alcohol abuse Father   . Hypertension Father   . Aneurysm Brother     abdominal  . Alcohol abuse Brother   . Hypertension Brother   . Diabetes Mother   . Alcohol abuse Mother   . Hypertension Other     family hx  . Hyperlipidemia Other     family hx  . Diabetes Other     family hx     Current Outpatient Prescriptions:  .  albuterol (PROAIR HFA) 108 (90 Base) MCG/ACT inhaler, Inhale 2 puffs into the lungs every 6 (  six) hours as needed for wheezing or shortness of breath. , Disp: , Rfl:  .  albuterol-ipratropium (COMBIVENT) 18-103 MCG/ACT inhaler, Inhale 2 puffs into the lungs daily as needed for wheezing or shortness of breath. , Disp: , Rfl:  .  ALPRAZolam (XANAX) 0.5 MG tablet, Take 1 tablet (0.5 mg total) by mouth at bedtime as needed for anxiety. (Patient taking differently: Take 0.5 mg by mouth at bedtime as needed for anxiety or sleep. ), Disp: 30 tablet, Rfl: 2 .  amLODipine (NORVASC) 5 MG tablet, Take 1 tablet (5 mg total) by mouth 2 (two) times daily., Disp: 180 tablet, Rfl: 4 .  atorvastatin (LIPITOR) 80 MG tablet, Take 80 mg by mouth daily. , Disp: , Rfl:  .  Blood Glucose Monitoring Suppl (FREESTYLE FREEDOM LITE) W/DEVICE KIT, Use 3 (three) times daily. 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 Dx Code: 250.00, Disp: , Rfl:  .  carisoprodol (SOMA) 350 MG tablet, Limit 1 tablet by mouth per day or twice per day if tolerated (Patient taking differently: Take 350 mg  by mouth 2 (two) times daily as needed for muscle spasms. IF TOLERATED (Limit of 1-2 daily)), Disp: 50 tablet, Rfl: 0 .  carvedilol (COREG) 12.5 MG tablet, Take 1 tablet (12.5 mg total) by mouth 2 (two) times daily with a meal., Disp: 180 tablet, Rfl: 3 .  fluticasone (FLONASE) 50 MCG/ACT nasal spray, Place 1 spray into both nostrils 2 (two) times daily as needed for allergies., Disp: , Rfl:  .  fluticasone (FLOVENT HFA) 110 MCG/ACT inhaler, Inhale 2 puffs into the lungs 2 (two) times daily., Disp: , Rfl:  .  HUMULIN 70/30 KWIKPEN (70-30) 100 UNIT/ML PEN, Inject 20 Units into the skin 2 (two) times daily. , Disp: , Rfl:  .  HYDROcodone-acetaminophen (NORCO) 10-325 MG tablet, Limit 3-6 tabs by mouth per day if tolerated, Disp: 180 tablet, Rfl: 0 .  lisinopril (PRINIVIL,ZESTRIL) 20 MG tablet, TAKE 1 TABLET DAILY (Patient taking differently: Take 20 mg by mouth once a day), Disp: 90 tablet, Rfl: 3 .  lithium carbonate (ESKALITH) 450 MG CR tablet, Take 1 tablet (450 mg total) by mouth at bedtime., Disp: 30 tablet, Rfl: 2 .  metFORMIN (GLUCOPHAGE-XR) 750 MG 24 hr tablet, Take 1,500 mg by mouth daily with supper., Disp: , Rfl:  .  nitroGLYCERIN (NITROSTAT) 0.4 MG SL tablet, Place 1 tablet (0.4 mg total) under the tongue every 5 (five) minutes as needed for chest pain., Disp: 25 tablet, Rfl: 6 .  omeprazole (PRILOSEC) 20 MG capsule, Take 20 mg by mouth daily. , Disp: , Rfl:  .  QUEtiapine (SEROQUEL) 300 MG tablet, Take 1.5 tablet at bed time. (Patient taking differently: Take 450 mg by mouth at bedtime. ), Disp: 135 tablet, Rfl: 2 .  RESTASIS 0.05 % ophthalmic emulsion, Place 1 drop into both eyes daily as needed (for dryness). , Disp: , Rfl:  .  rOPINIRole (REQUIP) 0.5 MG tablet, Take 1 mg by mouth at bedtime as needed (for restless legs). , Disp: , Rfl:  .  sitaGLIPtin (JANUVIA) 100 MG tablet, Take 100 mg by mouth daily., Disp: , Rfl:  .  Spacer/Aero-Holding Chambers (E-Z SPACER) inhaler, Reported on  01/15/2016, Disp: , Rfl:  .  Vilazodone HCl (VIIBRYD) 40 MG TABS, Take 1 tablet (40 mg total) by mouth daily., Disp: 90 tablet, Rfl: 2 .  zolpidem (AMBIEN) 5 MG tablet, Take 1 tablet (5 mg total) by mouth at bedtime., Disp: 30 tablet, Rfl: 2 .  pregabalin (LYRICA) 50 MG capsule, Limit 1 tablet by mouth per day or twice per day if tolerated (Patient not taking: Reported on 11/11/2016), Disp: 60 capsule, Rfl: 2  Current Facility-Administered Medications:  .  bupivacaine (PF) (MARCAINE) 0.25 % injection 30 mL, 30 mL, Other, Once, Mohammed Kindle, MD .  bupivacaine (PF) (MARCAINE) 0.25 % injection 30 mL, 30 mL, Other, Once, Mohammed Kindle, MD .  bupivacaine (PF) (MARCAINE) 0.25 % injection 30 mL, 30 mL, Other, Once, Mohammed Kindle, MD .  bupivacaine (PF) (MARCAINE) 0.25 % injection 30 mL, 30 mL, Other, Once, Mohammed Kindle, MD .  bupivacaine (PF) (MARCAINE) 0.25 % injection 30 mL, 30 mL, Other, Once, Mohammed Kindle, MD .  ceFAZolin (ANCEF) IVPB 1 g/50 mL premix, 1 g, Intravenous, Once, Mohammed Kindle, MD .  ceFAZolin (ANCEF) IVPB 1 g/50 mL premix, 1 g, Intravenous, Once, Mohammed Kindle, MD .  ceFAZolin (ANCEF) IVPB 1 g/50 mL premix, 1 g, Intravenous, Once, Mohammed Kindle, MD .  ceFAZolin (ANCEF) IVPB 1 g/50 mL premix, 1 g, Intravenous, Once, Mohammed Kindle, MD .  ceFAZolin (ANCEF) IVPB 1 g/50 mL premix, 1 g, Intravenous, Once, Mohammed Kindle, MD .  ceFAZolin (ANCEF) IVPB 1 g/50 mL premix, 1 g, Intravenous, Once, Mohammed Kindle, MD .  fentaNYL (SUBLIMAZE) injection 100 mcg, 100 mcg, Intravenous, Once, Mohammed Kindle, MD .  fentaNYL (SUBLIMAZE) injection 100 mcg, 100 mcg, Intravenous, Once, Mohammed Kindle, MD .  fentaNYL (SUBLIMAZE) injection 100 mcg, 100 mcg, Intravenous, Once, Mohammed Kindle, MD .  fentaNYL (SUBLIMAZE) injection 100 mcg, 100 mcg, Intravenous, Once, Mohammed Kindle, MD .  fentaNYL (SUBLIMAZE) injection 100 mcg, 100 mcg, Intravenous, Once, Mohammed Kindle, MD .  lactated ringers infusion 1,000 mL, 1,000  mL, Intravenous, Continuous, Mohammed Kindle, MD .  lactated ringers infusion 1,000 mL, 1,000 mL, Intravenous, Continuous, Mohammed Kindle, MD .  lactated ringers infusion 1,000 mL, 1,000 mL, Intravenous, Continuous, Mohammed Kindle, MD .  lactated ringers infusion 1,000 mL, 1,000 mL, Intravenous, Continuous, Mohammed Kindle, MD .  lactated ringers infusion 1,000 mL, 1,000 mL, Intravenous, Continuous, Mohammed Kindle, MD .  lactated ringers infusion 1,000 mL, 1,000 mL, Intravenous, Continuous, Mohammed Kindle, MD .  lactated ringers infusion 1,000 mL, 1,000 mL, Intravenous, Continuous, Mohammed Kindle, MD, Last Rate: 125 mL/hr at 06/03/16 0834, 1,000 mL at 06/03/16 0834 .  lidocaine (PF) (XYLOCAINE) 1 % injection 10 mL, 10 mL, Subcutaneous, Once, Mohammed Kindle, MD .  lidocaine (PF) (XYLOCAINE) 1 % injection 10 mL, 10 mL, Subcutaneous, Once, Mohammed Kindle, MD .  lidocaine (PF) (XYLOCAINE) 1 % injection 10 mL, 10 mL, Subcutaneous, Once, Mohammed Kindle, MD .  lidocaine (PF) (XYLOCAINE) 1 % injection 10 mL, 10 mL, Subcutaneous, Once, Mohammed Kindle, MD .  lidocaine (PF) (XYLOCAINE) 1 % injection 10 mL, 10 mL, Subcutaneous, Once, Mohammed Kindle, MD .  midazolam (VERSED) 5 MG/5ML injection 5 mg, 5 mg, Intravenous, Once, Mohammed Kindle, MD .  midazolam (VERSED) 5 MG/5ML injection 5 mg, 5 mg, Intravenous, Once, Mohammed Kindle, MD .  midazolam (VERSED) 5 MG/5ML injection 5 mg, 5 mg, Intravenous, Once, Mohammed Kindle, MD .  midazolam (VERSED) 5 MG/5ML injection 5 mg, 5 mg, Intravenous, Once, Mohammed Kindle, MD .  midazolam (VERSED) 5 MG/5ML injection 5 mg, 5 mg, Intravenous, Once, Mohammed Kindle, MD .  midazolam (VERSED) injection 5 mg, 5 mg, Intravenous, Once, Mohammed Kindle, MD .  orphenadrine (NORFLEX) injection 60 mg, 60 mg, Intramuscular, Once, Mohammed Kindle, MD .  orphenadrine (NORFLEX) injection 60 mg, 60 mg, Intramuscular, Once,  Mohammed Kindle, MD .  orphenadrine (NORFLEX) injection 60 mg, 60 mg, Intramuscular, Once,  Mohammed Kindle, MD .  orphenadrine (NORFLEX) injection 60 mg, 60 mg, Intramuscular, Once, Mohammed Kindle, MD .  orphenadrine (NORFLEX) injection 60 mg, 60 mg, Intramuscular, Once, Mohammed Kindle, MD .  sodium chloride 0.9 % injection 20 mL, 20 mL, Other, Once, Mohammed Kindle, MD .  sodium chloride flush (NS) 0.9 % injection 20 mL, 20 mL, Other, Once, Mohammed Kindle, MD .  triamcinolone acetonide (KENALOG-40) injection 40 mg, 40 mg, Other, Once, Mohammed Kindle, MD .  triamcinolone acetonide (KENALOG-40) injection 40 mg, 40 mg, Other, Once, Mohammed Kindle, MD .  triamcinolone acetonide (KENALOG-40) injection 40 mg, 40 mg, Other, Once, Mohammed Kindle, MD .  triamcinolone acetonide (KENALOG-40) injection 40 mg, 40 mg, Other, Once, Mohammed Kindle, MD .  triamcinolone acetonide (KENALOG-40) injection 40 mg, 40 mg, Other, Once, Mohammed Kindle, MD .  triamcinolone acetonide (KENALOG-40) injection 40 mg, 40 mg, Other, Once, Mohammed Kindle, MD .  triamcinolone acetonide (KENALOG-40) injection 40 mg, 40 mg, Other, Once, Mohammed Kindle, MD   Physical exam:  Vitals:   11/11/16 1423  BP: 129/77  Pulse: 80  Resp: 18  Temp: 98.1 F (36.7 C)  TempSrc: Tympanic  Weight: 161 lb 11.3 oz (73.4 kg)  Height: _0  (1.6 m)   Physical Exam  Constitutional: She is oriented to person, place, and time and well-developed, well-nourished, and in no distress.  HENT:  Head: Normocephalic and atraumatic.  Eyes: EOM are normal. Pupils are equal, round, and reactive to light.  Neck: Normal range of motion.  Cardiovascular: Normal rate, regular rhythm and normal heart sounds.   Pulmonary/Chest: Effort normal and breath sounds normal.  Abdominal: Soft. Bowel sounds are normal.  Neurological: She is alert and oriented to person, place, and time.  Skin: Skin is warm and dry.  3 small scattered bruises noted over the right upper extremity       CMP Latest Ref Rng & Units 10/30/2016  Glucose 65 - 99 mg/dL 133(H)  BUN 6 - 20  mg/dL 13  Creatinine 0.44 - 1.00 mg/dL 0.82  Sodium 135 - 145 mmol/L 135  Potassium 3.5 - 5.1 mmol/L 4.4  Chloride 101 - 111 mmol/L 99(L)  CO2 22 - 32 mmol/L 23  Calcium 8.9 - 10.3 mg/dL 10.7(H)  Total Protein 6.5 - 8.1 g/dL 8.1  Total Bilirubin 0.3 - 1.2 mg/dL 0.6  Alkaline Phos 38 - 126 U/L 97  AST 15 - 41 U/L 22  ALT 14 - 54 U/L 17   CBC Latest Ref Rng & Units 10/30/2016  WBC 4.0 - 10.5 K/uL 11.7(H)  Hemoglobin 12.0 - 15.0 g/dL 14.1  Hematocrit 36.0 - 46.0 % 41.5  Platelets 150 - 400 K/uL 409(H)     Assessment and plan- Patient is a 61 y.o. female Who has been referred to [place] for history of von Willebrand's disease for surgical clearance  1. We are unable to retrieve her prior workup for Von Willebrand disease which dates back to 72. Patient does have O positive blood group scan have mildly lower levels of Von Willebrand's factor. Today I will obtain a repeat CBC, PT PTT INR and a screening one Willebrand's panel including one Willebrand's antigen, factor VIII activity and ristocetin cofactor assay. I will see the patient back in about 1-2 weeks time after the results of the tests are back before making a final diagnosis. If the patient were to have one Willebrand's disease we will  have to conduct DDAVP challenge testing to see if she would respond to DDAVP prior to surgery. Given that she did not have any significant bleeding throughout her adult life I suspect she may have a mild form of von Willebrand's disease    Thank you for this kind referral and the opportunity to participate in the care of this patient   Visit Diagnosis 1. Von Willebrand's disease [facility])     Dr. Randa Evens, MD, MPH [facility] Pager- [phone] 11/11/2016

## 2016-11-12 LAB — COAG STUDIES INTERP REPORT: PDF Image: 0

## 2016-11-12 LAB — VON WILLEBRAND PANEL
COAGULATION FACTOR VIII: 150 % (ref 57–163)
Ristocetin Co-factor, Plasma: 86 % (ref 50–200)
Von Willebrand Antigen, Plasma: 71 % (ref 50–200)

## 2016-11-12 LAB — FACTOR 8 ASSAY: Coagulation Factor VIII: 145 % (ref 57–163)

## 2016-11-18 ENCOUNTER — Inpatient Hospital Stay (HOSPITAL_BASED_OUTPATIENT_CLINIC_OR_DEPARTMENT_OTHER): Admitting: Oncology

## 2016-11-18 VITALS — BP 136/77 | HR 78 | Temp 97.2°F | Resp 18 | Wt 158.1 lb

## 2016-11-18 DIAGNOSIS — Z794 Long term (current) use of insulin: Secondary | ICD-10-CM

## 2016-11-18 DIAGNOSIS — D680 Von Willebrand's disease: Secondary | ICD-10-CM

## 2016-11-18 DIAGNOSIS — M5117 Intervertebral disc disorders with radiculopathy, lumbosacral region: Secondary | ICD-10-CM | POA: Diagnosis not present

## 2016-11-18 DIAGNOSIS — E039 Hypothyroidism, unspecified: Secondary | ICD-10-CM

## 2016-11-18 DIAGNOSIS — G8929 Other chronic pain: Secondary | ICD-10-CM

## 2016-11-18 DIAGNOSIS — M961 Postlaminectomy syndrome, not elsewhere classified: Secondary | ICD-10-CM

## 2016-11-18 DIAGNOSIS — M549 Dorsalgia, unspecified: Secondary | ICD-10-CM

## 2016-11-18 DIAGNOSIS — E559 Vitamin D deficiency, unspecified: Secondary | ICD-10-CM

## 2016-11-18 DIAGNOSIS — J449 Chronic obstructive pulmonary disease, unspecified: Secondary | ICD-10-CM | POA: Diagnosis not present

## 2016-11-18 DIAGNOSIS — F419 Anxiety disorder, unspecified: Secondary | ICD-10-CM

## 2016-11-18 DIAGNOSIS — M5030 Other cervical disc degeneration, unspecified cervical region: Secondary | ICD-10-CM

## 2016-11-18 DIAGNOSIS — I2510 Atherosclerotic heart disease of native coronary artery without angina pectoris: Secondary | ICD-10-CM

## 2016-11-18 DIAGNOSIS — I10 Essential (primary) hypertension: Secondary | ICD-10-CM | POA: Diagnosis not present

## 2016-11-18 DIAGNOSIS — E785 Hyperlipidemia, unspecified: Secondary | ICD-10-CM | POA: Diagnosis not present

## 2016-11-18 DIAGNOSIS — M797 Fibromyalgia: Secondary | ICD-10-CM

## 2016-11-18 DIAGNOSIS — M5481 Occipital neuralgia: Secondary | ICD-10-CM

## 2016-11-18 DIAGNOSIS — G4700 Insomnia, unspecified: Secondary | ICD-10-CM | POA: Diagnosis not present

## 2016-11-18 DIAGNOSIS — E1165 Type 2 diabetes mellitus with hyperglycemia: Secondary | ICD-10-CM

## 2016-11-18 DIAGNOSIS — K219 Gastro-esophageal reflux disease without esophagitis: Secondary | ICD-10-CM

## 2016-11-18 DIAGNOSIS — D699 Hemorrhagic condition, unspecified: Secondary | ICD-10-CM

## 2016-11-18 DIAGNOSIS — R42 Dizziness and giddiness: Secondary | ICD-10-CM

## 2016-11-18 DIAGNOSIS — Z79899 Other long term (current) drug therapy: Secondary | ICD-10-CM

## 2016-11-18 DIAGNOSIS — R0602 Shortness of breath: Secondary | ICD-10-CM

## 2016-11-18 DIAGNOSIS — M48062 Spinal stenosis, lumbar region with neurogenic claudication: Secondary | ICD-10-CM | POA: Diagnosis not present

## 2016-11-18 DIAGNOSIS — Z85828 Personal history of other malignant neoplasm of skin: Secondary | ICD-10-CM

## 2016-11-18 DIAGNOSIS — Z87891 Personal history of nicotine dependence: Secondary | ICD-10-CM

## 2016-11-18 DIAGNOSIS — F319 Bipolar disorder, unspecified: Secondary | ICD-10-CM

## 2016-11-18 NOTE — Progress Notes (Signed)
Patient is here for lab results.

## 2016-11-18 NOTE — Progress Notes (Signed)
Hematology/Oncology Consult note [facility]  Telephone:([phone] [phone]  Patient Care Team: Valera Castle, MD as PCP - General Minna Merritts, MD (Cardiology)   Name of the patient: Chapel Silverthorn  [id number]  [birth year]   Date of visit: 11/18/16  Diagnosis- referred for evaluation of possible von Willebrand's disease  Chief complaint/ Reason for visit- discuss the results of blood work  Heme/Onc history: patient is a 61 year old female who was scheduled to undergo back surgery but reported that she had a history of one Willebrand's disease and hence is been referred to [place] for surgical clearance. Patient states that her father had frequent nosebleeds but had not been diagnosed with any formal bleeding disorder. No other know bleeding disorders in the family. Patient had nosebleeds as a child which subsequently resolved. She does get on and off mild gumbleeds.   She also had a history of heavy periods for which she underwent hysterectomy in 1985. Patient reports that she had bleeding complications after that surgery and was diagnosed with von Willebrand's disease at [facility] She has never seen a hematologist before. Prior to that she has had 3 vaginal deliveries without any significant bleeding issues. She also had prior back surgery and tooth extraction without any significant bleeding complications. Reports that she bruises easily. She did require blood transfusion after her hysterectomy surgery but has not otherwise had in the past. She has 3 other children and none of them have been diagnosed with any bleeding disorder.    Interval history- reports having issues with chronic back pain    Review of systems- Review of Systems  Constitutional: Negative for chills, fever, malaise/fatigue and weight loss.  HENT: Negative for congestion, ear discharge and nosebleeds.   Eyes: Negative for blurred vision.  Respiratory: Negative for  cough, hemoptysis, sputum production, shortness of breath and wheezing.   Cardiovascular: Negative for chest pain, palpitations, orthopnea and claudication.  Gastrointestinal: Negative for abdominal pain, blood in stool, constipation, diarrhea, heartburn, melena, nausea and vomiting.  Genitourinary: Negative for dysuria, flank pain, frequency, hematuria and urgency.  Musculoskeletal: Positive for back pain. Negative for joint pain and myalgias.  Skin: Negative for rash.  Neurological: Negative for dizziness, tingling, focal weakness, seizures, weakness and headaches.  Endo/Heme/Allergies: Does not bruise/bleed easily.  Psychiatric/Behavioral: Negative for depression and suicidal ideas. The patient does not have insomnia.       Allergies  Allergen Reactions  . No Known Allergies      Past Medical History:  Diagnosis Date  . Anxiety   . Bipolar depression ([place])   . Blood dyscrasia    VON WILLIBRAND  . CAD (coronary artery disease)   . 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 COPD (chronic obstructive pulmonary disease) ([place])    dr. Gar [facility]    PCP  . Depression   . Depression with anxiety   . Diabetes mellitus   . Dyspnea    W/ EXERTION   . Enlarged liver   . Fibromyalgia   . Generalized anxiety disorder   . GERD (gastroesophageal reflux disease)   . Hyperlipidemia   . Hypertension   . Low back pain   . Migraines   . Neuropathy ([place])   . Persistent disorder of initiating or maintaining sleep   . Von Willebrand disease ([place])      Past Surgical History:  Procedure Laterality Date  . ABDOMINAL HYSTERECTOMY    .  BACK SURGERY    . BREAST IMPLANT REMOVAL    . CARDIAC CATHETERIZATION     CAD,PCI of LAD, Cypher stent 2.5-28mm  . CARDIAC CATHETERIZATION     PCI of mid-LAD in stent restenosis with a drug-eluting stent  . CARDIAC CATHETERIZATION  06/03/13   [facility];MID LAD 100 % STENOSIS; PRIOR STENT; MID RCA 40 % STENOSIS  . CORONARY ARTERY BYPASS  GRAFT   08-12-2011   CABG x 3  . CORONARY STENT PLACEMENT    . LUMBAR FUSION     L1-S1    Social History   Social History  . Marital status: Married    Spouse name: N/A  . Number of children: N/A  . Years of education: N/A   Occupational History  . Not on file.   Social History Main Topics  . Smoking status: Former Smoker    Years: 16.00    Types: Cigarettes    Quit date: 06/20/2010  . Smokeless tobacco: Never Used  . Alcohol use No  . Drug use: No  . Sexual activity: Yes   Other Topics Concern  . Not on file   Social History Narrative  . No narrative on file    Family History  Problem Relation Age of Onset  . Aneurysm Father     abdominal  . Diabetes Father   . Alcohol abuse Father   . Hypertension Father   . Aneurysm Brother     abdominal  . Alcohol abuse Brother   . Hypertension Brother   . Diabetes Mother   . Alcohol abuse Mother   . Hypertension Other     family hx  . Hyperlipidemia Other     family hx  . Diabetes Other     family hx     Current Outpatient Prescriptions:  .  albuterol (PROAIR HFA) 108 (90 Base) MCG/ACT inhaler, Inhale 2 puffs into the lungs every 6 (six) hours as needed for wheezing or shortness of breath. , Disp: , Rfl:  .  albuterol-ipratropium (COMBIVENT) 18-103 MCG/ACT inhaler, Inhale 2 puffs into the lungs daily as needed for wheezing or shortness of breath. , Disp: , Rfl:  .  ALPRAZolam (XANAX) 0.5 MG tablet, Take 1 tablet (0.5 mg total) by mouth at bedtime as needed for anxiety. (Patient taking differently: Take 0.5 mg by mouth at bedtime as needed for anxiety or sleep. ), Disp: 30 tablet, Rfl: 2 .  amLODipine (NORVASC) 5 MG tablet, Take 1 tablet (5 mg total) by mouth 2 (two) times daily., Disp: 180 tablet, Rfl: 4 .  atorvastatin (LIPITOR) 80 MG tablet, Take 80 mg by mouth daily. , Disp: , Rfl:  .  Blood Glucose Monitoring Suppl (FREESTYLE FREEDOM LITE) W/DEVICE KIT, Use 3 (three) times daily. For poorly controlled Type 2 DM  on Insulin. Dx Code: 250.00, Disp: , Rfl:  .  carisoprodol (SOMA) 350 MG tablet, Limit 1 tablet by mouth per day or twice per day if tolerated (Patient taking differently: Take 350 mg by mouth 2 (two) times daily as needed for muscle spasms. IF TOLERATED (Limit of 1-2 daily)), Disp: 50 tablet, Rfl: 0 .  carvedilol (COREG) 12.5 MG tablet, Take 1 tablet (12.5 mg total) by mouth 2 (two) times daily with a meal., Disp: 180 tablet, Rfl: 3 .  fluticasone (FLONASE) 50 MCG/ACT nasal spray, Place 1 spray into both nostrils 2 (two) times daily as needed for allergies., Disp: , Rfl:  .  fluticasone (FLOVENT HFA) 110 MCG/ACT inhaler, Inhale 2 puffs into  the lungs 2 (two) times daily., Disp: , Rfl:  .  HUMULIN 70/30 KWIKPEN (70-30) 100 UNIT/ML PEN, Inject 20 Units into the skin 2 (two) times daily. , Disp: , Rfl:  .  HYDROcodone-acetaminophen (NORCO) 10-325 MG tablet, Limit 3-6 tabs by mouth per day if tolerated, Disp: 180 tablet, Rfl: 0 .  lisinopril (PRINIVIL,ZESTRIL) 20 MG tablet, TAKE 1 TABLET DAILY (Patient taking differently: Take 20 mg by mouth once a day), Disp: 90 tablet, Rfl: 3 .  lithium carbonate (ESKALITH) 450 MG CR tablet, Take 1 tablet (450 mg total) by mouth at bedtime., Disp: 30 tablet, Rfl: 2 .  metFORMIN (GLUCOPHAGE-XR) 750 MG 24 hr tablet, Take 1,500 mg by mouth daily with supper., Disp: , Rfl:  .  nitroGLYCERIN (NITROSTAT) 0.4 MG SL tablet, Place 1 tablet (0.4 mg total) under the tongue every 5 (five) minutes as needed for chest pain., Disp: 25 tablet, Rfl: 6 .  omeprazole (PRILOSEC) 20 MG capsule, Take 20 mg by mouth daily. , Disp: , Rfl:  .  pregabalin (LYRICA) 50 MG capsule, Limit 1 tablet by mouth per day or twice per day if tolerated (Patient not taking: Reported on 11/11/2016), Disp: 60 capsule, Rfl: 2 .  QUEtiapine (SEROQUEL) 300 MG tablet, Take 1.5 tablet at bed time. (Patient taking differently: Take 450 mg by mouth at bedtime. ), Disp: 135 tablet, Rfl: 2 .  RESTASIS 0.05 % ophthalmic  emulsion, Place 1 drop into both eyes daily as needed (for dryness). , Disp: , Rfl:  .  rOPINIRole (REQUIP) 0.5 MG tablet, Take 1 mg by mouth at bedtime as needed (for restless legs). , Disp: , Rfl:  .  sitaGLIPtin (JANUVIA) 100 MG tablet, Take 100 mg by mouth daily., Disp: , Rfl:  .  Spacer/Aero-Holding Chambers (E-Z SPACER) inhaler, Reported on 01/15/2016, Disp: , Rfl:  .  Vilazodone HCl (VIIBRYD) 40 MG TABS, Take 1 tablet (40 mg total) by mouth daily., Disp: 90 tablet, Rfl: 2 .  zolpidem (AMBIEN) 5 MG tablet, Take 1 tablet (5 mg total) by mouth at bedtime., Disp: 30 tablet, Rfl: 2  Current Facility-Administered Medications:  .  bupivacaine (PF) (MARCAINE) 0.25 % injection 30 mL, 30 mL, Other, Once, Mohammed Kindle, MD .  bupivacaine (PF) (MARCAINE) 0.25 % injection 30 mL, 30 mL, Other, Once, Mohammed Kindle, MD .  bupivacaine (PF) (MARCAINE) 0.25 % injection 30 mL, 30 mL, Other, Once, Mohammed Kindle, MD .  bupivacaine (PF) (MARCAINE) 0.25 % injection 30 mL, 30 mL, Other, Once, Mohammed Kindle, MD .  bupivacaine (PF) (MARCAINE) 0.25 % injection 30 mL, 30 mL, Other, Once, Mohammed Kindle, MD .  ceFAZolin (ANCEF) IVPB 1 g/50 mL premix, 1 g, Intravenous, Once, Mohammed Kindle, MD .  ceFAZolin (ANCEF) IVPB 1 g/50 mL premix, 1 g, Intravenous, Once, Mohammed Kindle, MD .  ceFAZolin (ANCEF) IVPB 1 g/50 mL premix, 1 g, Intravenous, Once, Mohammed Kindle, MD .  ceFAZolin (ANCEF) IVPB 1 g/50 mL premix, 1 g, Intravenous, Once, Mohammed Kindle, MD .  ceFAZolin (ANCEF) IVPB 1 g/50 mL premix, 1 g, Intravenous, Once, Mohammed Kindle, MD .  ceFAZolin (ANCEF) IVPB 1 g/50 mL premix, 1 g, Intravenous, Once, Mohammed Kindle, MD .  fentaNYL (SUBLIMAZE) injection 100 mcg, 100 mcg, Intravenous, Once, Mohammed Kindle, MD .  fentaNYL (SUBLIMAZE) injection 100 mcg, 100 mcg, Intravenous, Once, Mohammed Kindle, MD .  fentaNYL (SUBLIMAZE) injection 100 mcg, 100 mcg, Intravenous, Once, Mohammed Kindle, MD .  fentaNYL (SUBLIMAZE) injection 100 mcg,  100 mcg, Intravenous, Once, Belenda Cruise  Primus Bravo, MD .  fentaNYL (SUBLIMAZE) injection 100 mcg, 100 mcg, Intravenous, Once, Mohammed Kindle, MD .  lactated ringers infusion 1,000 mL, 1,000 mL, Intravenous, Continuous, Mohammed Kindle, MD .  lactated ringers infusion 1,000 mL, 1,000 mL, Intravenous, Continuous, Mohammed Kindle, MD .  lactated ringers infusion 1,000 mL, 1,000 mL, Intravenous, Continuous, Mohammed Kindle, MD .  lactated ringers infusion 1,000 mL, 1,000 mL, Intravenous, Continuous, Mohammed Kindle, MD .  lactated ringers infusion 1,000 mL, 1,000 mL, Intravenous, Continuous, Mohammed Kindle, MD .  lactated ringers infusion 1,000 mL, 1,000 mL, Intravenous, Continuous, Mohammed Kindle, MD .  lactated ringers infusion 1,000 mL, 1,000 mL, Intravenous, Continuous, Mohammed Kindle, MD, Last Rate: 125 mL/hr at 06/03/16 0834, 1,000 mL at 06/03/16 0834 .  lidocaine (PF) (XYLOCAINE) 1 % injection 10 mL, 10 mL, Subcutaneous, Once, Mohammed Kindle, MD .  lidocaine (PF) (XYLOCAINE) 1 % injection 10 mL, 10 mL, Subcutaneous, Once, Mohammed Kindle, MD .  lidocaine (PF) (XYLOCAINE) 1 % injection 10 mL, 10 mL, Subcutaneous, Once, Mohammed Kindle, MD .  lidocaine (PF) (XYLOCAINE) 1 % injection 10 mL, 10 mL, Subcutaneous, Once, Mohammed Kindle, MD .  lidocaine (PF) (XYLOCAINE) 1 % injection 10 mL, 10 mL, Subcutaneous, Once, Mohammed Kindle, MD .  midazolam (VERSED) 5 MG/5ML injection 5 mg, 5 mg, Intravenous, Once, Mohammed Kindle, MD .  midazolam (VERSED) 5 MG/5ML injection 5 mg, 5 mg, Intravenous, Once, Mohammed Kindle, MD .  midazolam (VERSED) 5 MG/5ML injection 5 mg, 5 mg, Intravenous, Once, Mohammed Kindle, MD .  midazolam (VERSED) 5 MG/5ML injection 5 mg, 5 mg, Intravenous, Once, Mohammed Kindle, MD .  midazolam (VERSED) 5 MG/5ML injection 5 mg, 5 mg, Intravenous, Once, Mohammed Kindle, MD .  midazolam (VERSED) injection 5 mg, 5 mg, Intravenous, Once, Mohammed Kindle, MD .  orphenadrine (NORFLEX) injection 60 mg, 60 mg, Intramuscular, Once,  Mohammed Kindle, MD .  orphenadrine (NORFLEX) injection 60 mg, 60 mg, Intramuscular, Once, Mohammed Kindle, MD .  orphenadrine (NORFLEX) injection 60 mg, 60 mg, Intramuscular, Once, Mohammed Kindle, MD .  orphenadrine (NORFLEX) injection 60 mg, 60 mg, Intramuscular, Once, Mohammed Kindle, MD .  orphenadrine (NORFLEX) injection 60 mg, 60 mg, Intramuscular, Once, Mohammed Kindle, MD .  sodium chloride 0.9 % injection 20 mL, 20 mL, Other, Once, Mohammed Kindle, MD .  sodium chloride flush (NS) 0.9 % injection 20 mL, 20 mL, Other, Once, Mohammed Kindle, MD .  triamcinolone acetonide (KENALOG-40) injection 40 mg, 40 mg, Other, Once, Mohammed Kindle, MD .  triamcinolone acetonide (KENALOG-40) injection 40 mg, 40 mg, Other, Once, Mohammed Kindle, MD .  triamcinolone acetonide (KENALOG-40) injection 40 mg, 40 mg, Other, Once, Mohammed Kindle, MD .  triamcinolone acetonide (KENALOG-40) injection 40 mg, 40 mg, Other, Once, Mohammed Kindle, MD .  triamcinolone acetonide (KENALOG-40) injection 40 mg, 40 mg, Other, Once, Mohammed Kindle, MD .  triamcinolone acetonide (KENALOG-40) injection 40 mg, 40 mg, Other, Once, Mohammed Kindle, MD .  triamcinolone acetonide (KENALOG-40) injection 40 mg, 40 mg, Other, Once, Mohammed Kindle, MD  Physical exam:  Vitals:   11/18/16 1038  BP: 136/77  Pulse: 78  Resp: 18  Temp: 97.2 F (36.2 C)  TempSrc: Tympanic  Weight: 158 lb 1.1 oz (71.7 kg)   Physical Exam  Constitutional: She is oriented to person, place, and time and well-developed, well-nourished, and in no distress.  HENT:  Head: Normocephalic and atraumatic.  Eyes: EOM are normal. Pupils are equal, round, and reactive to light.  Neck: Normal range of motion.  Cardiovascular: Normal  rate, regular rhythm and normal heart sounds.   Pulmonary/Chest: Effort normal and breath sounds normal.  Abdominal: Soft. Bowel sounds are normal.  Neurological: She is alert and oriented to person, place, and time.  Skin: Skin is warm and dry.      CMP Latest Ref Rng & Units 10/30/2016  Glucose 65 - 99 mg/dL 133(H)  BUN 6 - 20 mg/dL 13  Creatinine 0.44 - 1.00 mg/dL 0.82  Sodium 135 - 145 mmol/L 135  Potassium 3.5 - 5.1 mmol/L 4.4  Chloride 101 - 111 mmol/L 99(L)  CO2 22 - 32 mmol/L 23  Calcium 8.9 - 10.3 mg/dL 10.7(H)  Total Protein 6.5 - 8.1 g/dL 8.1  Total Bilirubin 0.3 - 1.2 mg/dL 0.6  Alkaline Phos 38 - 126 U/L 97  AST 15 - 41 U/L 22  ALT 14 - 54 U/L 17   CBC Latest Ref Rng & Units 11/11/2016  WBC 3.6 - 11.0 K/uL 7.0  Hemoglobin 12.0 - 16.0 g/dL 11.8(L)  Hematocrit 35.0 - 47.0 % 35.9  Platelets 150 - 440 K/uL 298    PT PTT INR were within normal limits. Results of von Willebrand's panel were as follows: 1 mg antigen level was normal at 71%. Factor VIII activity was normal at 150%. Priester; cofactor plasma was 86% (normal).    Assessment and plan- Patient is a 61 y.o. female who has been referred to [place] for evaluation of possible von Willebrand's disease prior to surgery   1. I discussed the results of the VWD panel with the patient which are within normal limits. Also patient has a normal PT PTT INR and a normal platelet count. Hence based on the results of the blood work, I do not think patient has any primary bleeding disorder. As such patient can proceed with surgery at this time without any further intervention from our side. NOTE patient has O positive blood group which can at times have mildly low levels of VWF which may have been the case in the past. Patient has had subsequent back surgery, CABG and tooth extraction following her hysterectomy without any bleeding issues.    Visit Diagnosis 1. Bleeding disorder ([place])      Dr. Randa Evens, MD, MPH [facility] at [facility] Pager- [phone] 11/18/2016 9:05 AM

## 2016-11-20 ENCOUNTER — Other Ambulatory Visit: Payer: Self-pay | Admitting: Neurological Surgery

## 2016-12-03 ENCOUNTER — Other Ambulatory Visit (HOSPITAL_COMMUNITY): Payer: Self-pay | Admitting: Neurological Surgery

## 2016-12-03 DIAGNOSIS — M5416 Radiculopathy, lumbar region: Secondary | ICD-10-CM

## 2016-12-04 ENCOUNTER — Encounter (HOSPITAL_COMMUNITY)
Admission: RE | Admit: 2016-12-04 | Discharge: 2016-12-04 | Disposition: A | Discharge status: Home / Self Care | Source: Ambulatory Visit | Attending: Neurological Surgery | Admitting: Neurological Surgery

## 2016-12-04 ENCOUNTER — Encounter (HOSPITAL_COMMUNITY): Payer: Self-pay

## 2016-12-04 DIAGNOSIS — E785 Hyperlipidemia, unspecified: Secondary | ICD-10-CM | POA: Insufficient documentation

## 2016-12-04 DIAGNOSIS — E1140 Type 2 diabetes mellitus with diabetic neuropathy, unspecified: Secondary | ICD-10-CM

## 2016-12-04 DIAGNOSIS — K219 Gastro-esophageal reflux disease without esophagitis: Secondary | ICD-10-CM | POA: Insufficient documentation

## 2016-12-04 DIAGNOSIS — M7138 Other bursal cyst, other site: Secondary | ICD-10-CM | POA: Diagnosis not present

## 2016-12-04 DIAGNOSIS — Z955 Presence of coronary angioplasty implant and graft: Secondary | ICD-10-CM | POA: Diagnosis not present

## 2016-12-04 DIAGNOSIS — M5416 Radiculopathy, lumbar region: Secondary | ICD-10-CM | POA: Insufficient documentation

## 2016-12-04 DIAGNOSIS — Z01812 Encounter for preprocedural laboratory examination: Secondary | ICD-10-CM | POA: Insufficient documentation

## 2016-12-04 DIAGNOSIS — Z7951 Long term (current) use of inhaled steroids: Secondary | ICD-10-CM | POA: Diagnosis not present

## 2016-12-04 DIAGNOSIS — J449 Chronic obstructive pulmonary disease, unspecified: Secondary | ICD-10-CM | POA: Insufficient documentation

## 2016-12-04 DIAGNOSIS — Z951 Presence of aortocoronary bypass graft: Secondary | ICD-10-CM | POA: Insufficient documentation

## 2016-12-04 DIAGNOSIS — F419 Anxiety disorder, unspecified: Secondary | ICD-10-CM | POA: Insufficient documentation

## 2016-12-04 DIAGNOSIS — I2510 Atherosclerotic heart disease of native coronary artery without angina pectoris: Secondary | ICD-10-CM | POA: Diagnosis not present

## 2016-12-04 DIAGNOSIS — D680 Von Willebrand's disease: Secondary | ICD-10-CM | POA: Diagnosis not present

## 2016-12-04 DIAGNOSIS — F319 Bipolar disorder, unspecified: Secondary | ICD-10-CM

## 2016-12-04 DIAGNOSIS — Z794 Long term (current) use of insulin: Secondary | ICD-10-CM | POA: Insufficient documentation

## 2016-12-04 DIAGNOSIS — I10 Essential (primary) hypertension: Secondary | ICD-10-CM | POA: Insufficient documentation

## 2016-12-04 DIAGNOSIS — Z87891 Personal history of nicotine dependence: Secondary | ICD-10-CM | POA: Insufficient documentation

## 2016-12-04 DIAGNOSIS — M797 Fibromyalgia: Secondary | ICD-10-CM | POA: Diagnosis not present

## 2016-12-04 DIAGNOSIS — Z85828 Personal history of other malignant neoplasm of skin: Secondary | ICD-10-CM

## 2016-12-04 DIAGNOSIS — Z01818 Encounter for other preprocedural examination: Secondary | ICD-10-CM

## 2016-12-04 DIAGNOSIS — Z0183 Encounter for blood typing: Secondary | ICD-10-CM

## 2016-12-04 DIAGNOSIS — Z981 Arthrodesis status: Secondary | ICD-10-CM | POA: Insufficient documentation

## 2016-12-04 DIAGNOSIS — Z79899 Other long term (current) drug therapy: Secondary | ICD-10-CM

## 2016-12-04 HISTORY — DX: Family history of other specified conditions: Z84.89

## 2016-12-04 HISTORY — DX: Unspecified osteoarthritis, unspecified site: M19.90

## 2016-12-04 HISTORY — DX: Anemia, unspecified: D64.9

## 2016-12-04 LAB — CBC
HEMATOCRIT: 41.5 % (ref 36.0–46.0)
HEMOGLOBIN: 13.3 g/dL (ref 12.0–15.0)
MCH: 28.5 pg (ref 26.0–34.0)
MCHC: 32 g/dL (ref 30.0–36.0)
MCV: 88.9 fL (ref 78.0–100.0)
Platelets: 397 10*3/uL (ref 150–400)
RBC: 4.67 MIL/uL (ref 3.87–5.11)
RDW: 14 % (ref 11.5–15.5)
WBC: 9 10*3/uL (ref 4.0–10.5)

## 2016-12-04 LAB — SURGICAL PCR SCREEN
MRSA, PCR: NEGATIVE
STAPHYLOCOCCUS AUREUS: NEGATIVE

## 2016-12-04 LAB — BASIC METABOLIC PANEL
ANION GAP: 9 (ref 5–15)
BUN: 13 mg/dL (ref 6–20)
CHLORIDE: 101 mmol/L (ref 101–111)
CO2: 26 mmol/L (ref 22–32)
Calcium: 10.2 mg/dL (ref 8.9–10.3)
Creatinine, Ser: 0.7 mg/dL (ref 0.44–1.00)
GFR calc Af Amer: >60 mL/min (ref >60)
GFR calc non Af Amer: >60 mL/min (ref >60)
GLUCOSE: 231 mg/dL — AB (ref 65–99)
POTASSIUM: 3.8 mmol/L (ref 3.5–5.1)
Sodium: 136 mmol/L (ref 135–145)

## 2016-12-04 LAB — TYPE AND SCREEN
ABO/RH(D): O POS
Antibody Screen: NEGATIVE

## 2016-12-04 LAB — GLUCOSE, CAPILLARY: Glucose-Capillary: 215 mg/dL — ABNORMAL HIGH (ref 65–99)

## 2016-12-04 LAB — APTT: APTT: 33 s (ref 24–36)

## 2016-12-04 LAB — PROTIME-INR
INR: 0.97
PROTHROMBIN TIME: 12.9 s (ref 11.4–15.2)

## 2016-12-04 NOTE — Pre-Procedure Instructions (Signed)
[place]  12/04/2016      [id number] - [place], [place] - [address] [place] [address] Phone: [phone] Fax: [phone]  [place], [place] [address] Phone: [phone] Fax: [phone]  [facility] [place], [place] [address] [place] [address] Phone: [phone] Fax: [phone]    Your procedure is scheduled on 12/10/2016  Report to [facility] Admitting at 5:30 A.M.  Call this number if you have problems the morning of surgery:  [phone]   Remember:  Do not eat food or drink liquids after midnight.     Take these medicines the morning of surgery with A SIP OF WATER : Carvedilol, Hydrocodone ( if needed) , Vibyrd & Lithium, omeprazole   Do not wear jewelry, make-up or nail polish.   Do not wear lotions, powders, or perfumes,  or deoderant.   Do not shave 48 hours prior to surgery.  Men  may shave face and neck.  Do not bring valuables to the hospital.   Special Instructions: [facility] - Preparing for Surgery  Before surgery, you can play an important role.  Because skin is not sterile, your skin needs to be as free of germs as possible.  You can reduce the number of germs on you skin by washing with CHG (chlorahexidine gluconate) soap before surgery.  CHG is an antiseptic cleaner which kills germs and bonds with the skin to continue killing germs even after washing.  Please DO NOT use if you have an allergy to CHG or antibacterial soaps.  If your skin becomes reddened/irritated stop using the CHG and inform your nurse when you arrive at Short Stay.  Do not shave (including legs and underarms) for at least 48 hours prior to the first CHG shower.  You may shave your face.  Please follow these instructions carefully:   1.  Shower with CHG Soap the night before  surgery and the  morning of Surgery.  2.  If you choose to wash your hair, wash your hair first as usual with your  normal shampoo.  3.  After you shampoo, rinse your hair and body thoroughly to remove the  Shampoo.  4.  Use CHG as you would any other liquid soap.  You can apply chg directly to the skin and wash gently with scrungie or a clean washcloth.  5.  Apply the CHG Soap to your body ONLY FROM THE NECK DOWN.    Do not use on open wounds or open sores.  Avoid contact with your eyes, ears, mouth and genitals (private parts).  Wash genitals (private parts)   with your normal soap.  6.  Wash thoroughly, paying special attention to the area where your surgery will be performed.  7.  Thoroughly rinse your body with warm water from the neck down.  8.  DO NOT shower/wash with your normal soap after using and rinsing off   the CHG Soap.  9.  Pat yourself dry with a clean towel.            10.  Wear clean pajamas.            11.  Place clean sheets on your bed the night of your first shower and do not sleep with pets.  Day of Surgery  Do not apply  any lotions/deodorants the morning of surgery.  Please wear clean clothes to the hospital/surgery center.    How to Manage Your Diabetes Before and After Surgery  Why is it important to control my blood sugar before and after surgery? . Improving blood sugar levels before and after surgery helps healing and can limit problems. . A way of improving blood sugar control is eating a healthy diet by: o  Eating less sugar and carbohydrates o  Increasing activity/exercise o  Talking with your doctor about reaching your blood sugar goals . High blood sugars (greater than 180 mg/dL) can raise your risk of infections and slow your recovery, so you will need to focus on controlling your diabetes during the weeks before surgery. . Make sure that the doctor who takes care of your diabetes knows about your planned surgery including the date and location.  How  do I manage my blood sugar before surgery? . Check your blood sugar at least 4 times a day, starting 2 days before surgery, to make sure that the level is not too high or low. o Check your blood sugar the morning of your surgery when you wake up and every 2 hours until you get to the Short Stay unit. . If your blood sugar is less than 70 mg/dL, you will need to treat for low blood sugar: o Do not take insulin. o Treat a low blood sugar (less than 70 mg/dL) with  cup of clear juice (cranberry or apple), 4 glucose tablets, OR glucose gel. o Recheck blood sugar in 15 minutes after treatment (to make sure it is greater than 70 mg/dL). If your blood sugar is not greater than 70 mg/dL on recheck, call [phone] for further instructions. . Report your blood sugar to the short stay nurse when you get to Short Stay.  . If you are admitted to the hospital after surgery: o Your blood sugar will be checked by the staff and you will probably be given insulin after surgery (instead of oral diabetes medicines) to make sure you have good blood sugar levels. o The goal for blood sugar control after surgery is 80-180 mg/dL.              WHAT DO I DO ABOUT MY DIABETES MEDICATION?   Marland Kitchen Do not take oral diabetes medicines (pills) the morning of surgery.  . THE NIGHT BEFORE SURGERY, take _   14 __________ units of __HUMULIN   70/30_________insulin.       . THE MORNING OF SURGERY, _NONE_________insulin.  .   . If your CBG is greater than 220 mg/dL, you may take  of your sliding scale (correction) dose of insulin.  Other Instructions:          Patient Signature:  Date:   Nurse Signature:  Date:   Reviewed and Endorsed by [facility] Patient Education Committee, August 2015  [facility] is not responsible for any belongings or valuables.  Contacts, dentures or bridgework may not be worn into surgery.  Leave your suitcase in the car.  After surgery it may be brought to your room.  For  patients admitted to the hospital, discharge time will be determined by your treatment team.  Patients discharged the day of surgery will not be allowed to drive home.   Name and phone number of your driver:   With family   Please read over the following fact sheets that you were given. Pain Booklet, Coughing and Deep Breathing, MRSA Information and Surgical  Site Infection Prevention

## 2016-12-04 NOTE — Progress Notes (Signed)
Pt. Had CABG in 2012, reports that he breathing chest are not of any concern right at present. Her surgery was rescheduled from January to now due to hematology workup. She reports everything worked out fine & the hematologist has cleared her for surgery.

## 2016-12-05 ENCOUNTER — Encounter (HOSPITAL_COMMUNITY): Payer: Self-pay

## 2016-12-05 NOTE — Progress Notes (Signed)
Anesthesia chart review: Patient is a 61 year old female scheduled for L4-5 anterior lateral lumbar fusion, extension fusion to L4, posterior lateral fusion L4-5, reoperative laminectomy L5-S1, laminectomy L4-5, excision of synovial cyst right L5-S1, left L4-5 on 12/10/16 by Dr. Cyndy Freeze. Procedure was initially scheduled for 11/04/16, but was postponed in order to clarify patient's reported history of von Willebrand's disease. Since then patient was referred to hematologist Dr. Randa [name] with [facility].  Dr. Janese Banks ordred a VWD panel which he reported as "within normal limits." PT/PTT and PLT count were also normal. Subsequently, he wrote, "Hence based on the results of the blood work, I do not think patient has any primary bleeding disorder. As such patient can proceed with surgery at this time without any further intervention from our side. NOTE patient has O positive blood group which can at times have mildly low levels of VWF which may have been the case in the past."  History includes former smoker, CAD s/p DES LAD 08/2006 and 05/2010 and s/p CABG (LIMA-LAD, SVG-RCA, SVG-DIAG) 08/12/11, GERD, hyperlipidemia, hypertension, diabetes mellitus type 2, migraines, depression, anxiety, bipolar disorder, neuropathy, hepatomegaly, skin cancer, COPD, exertional dyspnea, fibromyalgia, hysterectomy, lumbar fusion '07 [facility]). As above, she reported being diagnosed with von Willebrand's disease in '85 after significant bleeding surgery for ruptured ovarian cyst/ectopic pregnancy and required several transfusions; She thought she saw a hematologist at [facility] around that time, but had not had any further hematology follow-up or bleeding issues with surgery. She did get DDAVP as a precaution prior to her CABG in 2012. Recent referral to hematologist Dr. Janese Banks included lab work that was NOT consistent with VWD. Dr. Janese Banks did not feel patient had any primary bleeding disorder.  PCP is Dr. Kym [name] with [place], last visit 10/24/16 (see Care Everywhere). Cardiologist is Dr. Ida [name]. On 10/16/16 he wrote, "If no significant change, Acceptable risk for surgery. Negative stress test 04/2016."  Meds include albuterol, Combivent, Xanax, amlodipine, Lipitor, Soma, Coreg, Flovent, Flonase, Humulin 70/30, Norco, lisinopril, lithium carbonate, metformin, Nitro, Lyrica, Seroquel, Requip, Januvia, Ambien, vilazodone.  BP (!) 149/75   Pulse 88   Temp 36.8 C   Resp 18   Ht 5\' 3"  (1.6 m)   Wt 158 lb 3.2 oz (71.8 kg)   SpO2 100%   BMI 28.02 kg/m   EKG 05/15/16: NSR.  Nuclear stress test 05/23/16: Pharmacological myocardial perfusion imaging study with no significant ischemia Normal wall motion, EF estimated at 73% No EKG changes concerning for ischemia at peak stress or in recovery. Low risk scan  Echo 11/30/13: Study Conclusions Left ventricle: The cavity size was normal. Wall thickness was increased in a pattern of mild LVH. Systolic function was normal. Wall motion was normal; there were no regional wall motion abnormalities.  Cardiac cath 06/03/13 (scanned under Media tab; [facility]): Summary:  100% occluded mid LAD at the site of a stent, mild 40% mid RCA disease, saphenous vein graft to the diagonal and LIMA graft to the LAD are patent. Occluded saphenous vein graft to the distal RCA. Numerous catheters used in attempt to engage the SVG to RCA. Very difficult case secondary to difficulty engaging SVG to RCA ostium to visualize. Finally an aortic run off run was performed that showed occluded ostial SVG. No intervention needed. Medical management was recommended.  Preoperative labs noted. Cr 0.70. Glucose 231. CBC, PT/PTT WNL.Marland Kitchen A1c 8.5 on 10/30/16 (was also 8.5 on 10/24/16 which was down from 10.2 on 09/24/16; see Care Everywhere).  Hematology labs from 11/11/16 showed: PT PTT INR were within normal limits. Results of von Willebrand's panel were as follows: 1 mg antigen level was normal at  71%. Factor VIII activity was normal at 150%. Priester; cofactor plasma was 86% (normal).   If no acute changes then I anticipate that she can proceed as planned.  [name] Short Stay Center/Anesthesiology Phone [phone] 12/05/2016 2:08 PM

## 2016-12-06 ENCOUNTER — Ambulatory Visit

## 2016-12-06 ENCOUNTER — Ambulatory Visit (HOSPITAL_COMMUNITY)
Admission: RE | Admit: 2016-12-06 | Discharge: 2016-12-06 | Disposition: A | Discharge status: Home / Self Care | Source: Ambulatory Visit | Attending: Neurological Surgery | Admitting: Neurological Surgery

## 2016-12-06 DIAGNOSIS — I10 Essential (primary) hypertension: Secondary | ICD-10-CM | POA: Insufficient documentation

## 2016-12-06 DIAGNOSIS — Z981 Arthrodesis status: Secondary | ICD-10-CM | POA: Insufficient documentation

## 2016-12-06 DIAGNOSIS — Z0183 Encounter for blood typing: Secondary | ICD-10-CM | POA: Insufficient documentation

## 2016-12-06 DIAGNOSIS — E1140 Type 2 diabetes mellitus with diabetic neuropathy, unspecified: Secondary | ICD-10-CM | POA: Insufficient documentation

## 2016-12-06 DIAGNOSIS — M5416 Radiculopathy, lumbar region: Secondary | ICD-10-CM

## 2016-12-06 DIAGNOSIS — Z955 Presence of coronary angioplasty implant and graft: Secondary | ICD-10-CM | POA: Insufficient documentation

## 2016-12-06 DIAGNOSIS — Z01812 Encounter for preprocedural laboratory examination: Secondary | ICD-10-CM | POA: Insufficient documentation

## 2016-12-06 DIAGNOSIS — Z85828 Personal history of other malignant neoplasm of skin: Secondary | ICD-10-CM | POA: Insufficient documentation

## 2016-12-06 DIAGNOSIS — Z87891 Personal history of nicotine dependence: Secondary | ICD-10-CM | POA: Insufficient documentation

## 2016-12-06 DIAGNOSIS — M7138 Other bursal cyst, other site: Secondary | ICD-10-CM | POA: Insufficient documentation

## 2016-12-06 DIAGNOSIS — F319 Bipolar disorder, unspecified: Secondary | ICD-10-CM | POA: Insufficient documentation

## 2016-12-06 DIAGNOSIS — M797 Fibromyalgia: Secondary | ICD-10-CM | POA: Insufficient documentation

## 2016-12-06 DIAGNOSIS — Z79899 Other long term (current) drug therapy: Secondary | ICD-10-CM | POA: Insufficient documentation

## 2016-12-06 DIAGNOSIS — E785 Hyperlipidemia, unspecified: Secondary | ICD-10-CM | POA: Insufficient documentation

## 2016-12-06 DIAGNOSIS — D680 Von Willebrand's disease: Secondary | ICD-10-CM | POA: Insufficient documentation

## 2016-12-06 DIAGNOSIS — F419 Anxiety disorder, unspecified: Secondary | ICD-10-CM | POA: Insufficient documentation

## 2016-12-06 DIAGNOSIS — J449 Chronic obstructive pulmonary disease, unspecified: Secondary | ICD-10-CM | POA: Insufficient documentation

## 2016-12-06 DIAGNOSIS — Z7951 Long term (current) use of inhaled steroids: Secondary | ICD-10-CM | POA: Insufficient documentation

## 2016-12-06 DIAGNOSIS — I2510 Atherosclerotic heart disease of native coronary artery without angina pectoris: Secondary | ICD-10-CM | POA: Insufficient documentation

## 2016-12-06 DIAGNOSIS — Z794 Long term (current) use of insulin: Secondary | ICD-10-CM | POA: Insufficient documentation

## 2016-12-06 DIAGNOSIS — Z01818 Encounter for other preprocedural examination: Secondary | ICD-10-CM | POA: Insufficient documentation

## 2016-12-06 DIAGNOSIS — K219 Gastro-esophageal reflux disease without esophagitis: Secondary | ICD-10-CM | POA: Insufficient documentation

## 2016-12-06 DIAGNOSIS — Z951 Presence of aortocoronary bypass graft: Secondary | ICD-10-CM | POA: Insufficient documentation

## 2016-12-09 NOTE — Anesthesia Preprocedure Evaluation (Addendum)
Anesthesia Evaluation  Patient identified by MRN, date of birth, ID band Patient awake    Reviewed: Allergy & Precautions, NPO status , Patient's Chart, lab work & pertinent test results  History of Anesthesia Complications Negative for: history of anesthetic complications  Airway Mallampati: II  TM Distance: >3 FB Neck ROM: Full    Dental  (+) Caps, Dental Advisory Given, Teeth Intact   Pulmonary shortness of breath and with exertion, COPD, former smoker,    Pulmonary exam normal        Cardiovascular hypertension, Pt. on medications and Pt. on home beta blockers + CAD, + Cardiac Stents and + CABG  negative cardio ROS Normal cardiovascular exam Rhythm:regular Rate:Normal  2015 Stress test result Normal stress test, normal perfusion Would talk with psych about management of anxiety sx Study Conclusions  Left ventricle: The cavity size was normal. Wall thickness was increased in a pattern of mild LVH. Systolic function was normal. Wall motion was normal; there were no regional wall motion abnormalities.   Neuro/Psych PSYCHIATRIC DISORDERS Anxiety Depression Bipolar Disorder  Neuromuscular disease    GI/Hepatic Neg liver ROS, GERD  ,  Endo/Other  negative endocrine ROSdiabetes  Renal/GU negative Renal ROS     Musculoskeletal negative musculoskeletal ROS (+)   Abdominal   Peds  Hematology negative hematology ROS (+)   Anesthesia Other Findings Day of surgery medications reviewed with the patient.  Reproductive/Obstetrics                         Anesthesia Physical Anesthesia Plan  ASA: III  Anesthesia Plan: General   Post-op Pain Management:    Induction: Intravenous  Airway Management Planned: Oral ETT  Additional Equipment:   Intra-op Plan:   Post-operative Plan: Extubation in OR  Informed Consent: I have reviewed the patients History and Physical, chart, labs and discussed  the procedure including the risks, benefits and alternatives for the proposed anesthesia with the patient or authorized representative who has indicated his/her understanding and acceptance.   Dental advisory given and Dental Advisory Given  Plan Discussed with: CRNA, Anesthesiologist and Surgeon  Anesthesia Plan Comments:       Anesthesia Quick Evaluation

## 2016-12-10 ENCOUNTER — Inpatient Hospital Stay (HOSPITAL_COMMUNITY)
Admission: RE | Admit: 2016-12-10 | Discharge: 2016-12-12 | DRG: 455 | Disposition: A | Discharge status: Home Health | Source: Ambulatory Visit | Attending: Neurological Surgery | Admitting: Neurological Surgery

## 2016-12-10 ENCOUNTER — Inpatient Hospital Stay (HOSPITAL_COMMUNITY): Admitting: Anesthesiology

## 2016-12-10 ENCOUNTER — Inpatient Hospital Stay (HOSPITAL_COMMUNITY): Admitting: Vascular Surgery

## 2016-12-10 ENCOUNTER — Inpatient Hospital Stay (HOSPITAL_COMMUNITY)

## 2016-12-10 ENCOUNTER — Encounter (HOSPITAL_COMMUNITY): Payer: Self-pay | Admitting: *Deleted

## 2016-12-10 ENCOUNTER — Encounter (HOSPITAL_COMMUNITY)
Admission: RE | Disposition: A | Discharge status: Home Health | Payer: Self-pay | Source: Ambulatory Visit | Attending: Neurological Surgery

## 2016-12-10 DIAGNOSIS — J449 Chronic obstructive pulmonary disease, unspecified: Secondary | ICD-10-CM | POA: Diagnosis present

## 2016-12-10 DIAGNOSIS — M7138 Other bursal cyst, other site: Secondary | ICD-10-CM | POA: Diagnosis present

## 2016-12-10 DIAGNOSIS — M4727 Other spondylosis with radiculopathy, lumbosacral region: Principal | ICD-10-CM | POA: Diagnosis present

## 2016-12-10 DIAGNOSIS — Z87891 Personal history of nicotine dependence: Secondary | ICD-10-CM

## 2016-12-10 DIAGNOSIS — M797 Fibromyalgia: Secondary | ICD-10-CM | POA: Diagnosis present

## 2016-12-10 DIAGNOSIS — Z981 Arthrodesis status: Secondary | ICD-10-CM | POA: Diagnosis not present

## 2016-12-10 DIAGNOSIS — Z955 Presence of coronary angioplasty implant and graft: Secondary | ICD-10-CM

## 2016-12-10 DIAGNOSIS — M48061 Spinal stenosis, lumbar region without neurogenic claudication: Secondary | ICD-10-CM | POA: Diagnosis present

## 2016-12-10 DIAGNOSIS — M549 Dorsalgia, unspecified: Secondary | ICD-10-CM | POA: Diagnosis present

## 2016-12-10 DIAGNOSIS — F319 Bipolar disorder, unspecified: Secondary | ICD-10-CM | POA: Diagnosis present

## 2016-12-10 DIAGNOSIS — Z794 Long term (current) use of insulin: Secondary | ICD-10-CM | POA: Diagnosis not present

## 2016-12-10 DIAGNOSIS — F411 Generalized anxiety disorder: Secondary | ICD-10-CM | POA: Diagnosis present

## 2016-12-10 DIAGNOSIS — K219 Gastro-esophageal reflux disease without esophagitis: Secondary | ICD-10-CM | POA: Diagnosis present

## 2016-12-10 DIAGNOSIS — Z7951 Long term (current) use of inhaled steroids: Secondary | ICD-10-CM

## 2016-12-10 DIAGNOSIS — I10 Essential (primary) hypertension: Secondary | ICD-10-CM | POA: Diagnosis present

## 2016-12-10 DIAGNOSIS — E1142 Type 2 diabetes mellitus with diabetic polyneuropathy: Secondary | ICD-10-CM | POA: Diagnosis present

## 2016-12-10 DIAGNOSIS — Z419 Encounter for procedure for purposes other than remedying health state, unspecified: Secondary | ICD-10-CM

## 2016-12-10 DIAGNOSIS — G4700 Insomnia, unspecified: Secondary | ICD-10-CM | POA: Diagnosis present

## 2016-12-10 DIAGNOSIS — I2510 Atherosclerotic heart disease of native coronary artery without angina pectoris: Secondary | ICD-10-CM | POA: Diagnosis present

## 2016-12-10 DIAGNOSIS — E785 Hyperlipidemia, unspecified: Secondary | ICD-10-CM | POA: Diagnosis present

## 2016-12-10 HISTORY — PX: LUMBAR PERCUTANEOUS PEDICLE SCREW 1 LEVEL: SHX5560

## 2016-12-10 HISTORY — PX: ANTERIOR LAT LUMBAR FUSION: SHX1168

## 2016-12-10 HISTORY — PX: APPLICATION OF ROBOTIC ASSISTANCE FOR SPINAL PROCEDURE: SHX6753

## 2016-12-10 LAB — POCT I-STAT 4, (NA,K, GLUC, HGB,HCT)
GLUCOSE: 177 mg/dL — AB (ref 65–99)
HEMATOCRIT: 29 % — AB (ref 36.0–46.0)
Hemoglobin: 9.9 g/dL — ABNORMAL LOW (ref 12.0–15.0)
POTASSIUM: 4.3 mmol/L (ref 3.5–5.1)
SODIUM: 138 mmol/L (ref 135–145)

## 2016-12-10 LAB — GLUCOSE, CAPILLARY
GLUCOSE-CAPILLARY: 207 mg/dL — AB (ref 65–99)
GLUCOSE-CAPILLARY: 113 mg/dL — AB (ref 65–99)
Glucose-Capillary: 175 mg/dL — ABNORMAL HIGH (ref 65–99)
Glucose-Capillary: 174 mg/dL — ABNORMAL HIGH (ref 65–99)

## 2016-12-10 SURGERY — ANTERIOR LATERAL LUMBAR FUSION 1 LEVEL
Anesthesia: General | Site: Back

## 2016-12-10 MED ORDER — ACETAMINOPHEN 10 MG/ML IV SOLN
INTRAVENOUS | Status: AC
  Filled 2016-12-10: qty 100

## 2016-12-10 MED ORDER — DOCUSATE SODIUM 100 MG PO CAPS
100.0000 mg | ORAL_CAPSULE | Freq: Two times a day (BID) | ORAL | Status: DC
  Administered 2016-12-10 – 2016-12-12 (×4): 100 mg via ORAL
  Filled 2016-12-10 (×4): qty 1

## 2016-12-10 MED ORDER — SODIUM CHLORIDE 0.9% FLUSH: 3.0000 mL | Freq: Two times a day (BID) | INTRAVENOUS | Status: DC

## 2016-12-10 MED ORDER — PROPOFOL 10 MG/ML IV BOLUS
INTRAVENOUS | Status: AC
  Filled 2016-12-10: qty 20

## 2016-12-10 MED ORDER — FENTANYL CITRATE (PF) 100 MCG/2ML IJ SOLN
INTRAMUSCULAR | Status: AC
  Filled 2016-12-10: qty 2

## 2016-12-10 MED ORDER — FENTANYL CITRATE (PF) 100 MCG/2ML IJ SOLN
INTRAMUSCULAR | Status: DC | PRN
  Administered 2016-12-10: 50 ug via INTRAVENOUS
  Administered 2016-12-10 (×3): 100 ug via INTRAVENOUS
  Administered 2016-12-10: 50 ug via INTRAVENOUS

## 2016-12-10 MED ORDER — FLUTICASONE PROPIONATE 50 MCG/ACT NA SUSP
1.0000 | Freq: Two times a day (BID) | NASAL | Status: DC | PRN
  Filled 2016-12-10: qty 16

## 2016-12-10 MED ORDER — PANTOPRAZOLE SODIUM 40 MG PO TBEC
40.0000 mg | DELAYED_RELEASE_TABLET | Freq: Every day | ORAL | Status: DC
  Administered 2016-12-10 – 2016-12-12 (×3): 40 mg via ORAL
  Filled 2016-12-10 (×3): qty 1

## 2016-12-10 MED ORDER — VANCOMYCIN HCL 1000 MG IV SOLR
INTRAVENOUS | Status: DC | PRN
  Administered 2016-12-10: 1000 mg

## 2016-12-10 MED ORDER — SODIUM CHLORIDE 0.9 % IJ SOLN
INTRAMUSCULAR | Status: DC | PRN
  Administered 2016-12-10 (×2): 10 mL

## 2016-12-10 MED ORDER — ZOLPIDEM TARTRATE 5 MG PO TABS
5.0000 mg | ORAL_TABLET | Freq: Every day | ORAL | Status: DC
  Administered 2016-12-10 – 2016-12-11 (×2): 5 mg via ORAL
  Filled 2016-12-10 (×2): qty 1

## 2016-12-10 MED ORDER — PHENOL 1.4 % MT LIQD: 1.0000 | OROMUCOSAL | Status: DC | PRN

## 2016-12-10 MED ORDER — THROMBIN 5000 UNITS EX SOLR
OROMUCOSAL | Status: DC | PRN
  Administered 2016-12-10: 5 mL via TOPICAL

## 2016-12-10 MED ORDER — SODIUM CHLORIDE 0.9% FLUSH: 3.0000 mL | INTRAVENOUS | Status: DC | PRN

## 2016-12-10 MED ORDER — CELECOXIB 200 MG PO CAPS
200.0000 mg | ORAL_CAPSULE | ORAL | Status: AC
  Administered 2016-12-10: 200 mg via ORAL
  Filled 2016-12-10: qty 1

## 2016-12-10 MED ORDER — ONDANSETRON HCL 4 MG/2ML IJ SOLN
INTRAMUSCULAR | Status: AC
Start: 2016-12-10 — End: 2016-12-10
  Filled 2016-12-10: qty 2

## 2016-12-10 MED ORDER — MIDAZOLAM HCL 2 MG/2ML IJ SOLN
INTRAMUSCULAR | Status: AC
  Filled 2016-12-10: qty 2

## 2016-12-10 MED ORDER — LIDOCAINE HCL (CARDIAC) 20 MG/ML IV SOLN
INTRAVENOUS | Status: DC | PRN
  Administered 2016-12-10: 100 mg via INTRAVENOUS

## 2016-12-10 MED ORDER — LITHIUM CARBONATE ER 450 MG PO TBCR
450.0000 mg | EXTENDED_RELEASE_TABLET | Freq: Every day | ORAL | Status: DC
  Administered 2016-12-10 – 2016-12-11 (×2): 450 mg via ORAL
  Filled 2016-12-10 (×3): qty 1

## 2016-12-10 MED ORDER — IPRATROPIUM-ALBUTEROL 0.5-2.5 (3) MG/3ML IN SOLN: 3.0000 mL | Freq: Every day | RESPIRATORY_TRACT | Status: DC | PRN

## 2016-12-10 MED ORDER — HYDROMORPHONE HCL 1 MG/ML IJ SOLN
INTRAMUSCULAR | Status: AC
  Filled 2016-12-10: qty 1

## 2016-12-10 MED ORDER — MENTHOL 3 MG MT LOZG: 1.0000 | LOZENGE | OROMUCOSAL | Status: DC | PRN

## 2016-12-10 MED ORDER — BUPIVACAINE LIPOSOME 1.3 % IJ SUSP
INTRAMUSCULAR | Status: DC | PRN
  Administered 2016-12-10: 20 mL

## 2016-12-10 MED ORDER — ONDANSETRON HCL 4 MG/2ML IJ SOLN
4.0000 mg | INTRAMUSCULAR | Status: DC | PRN
  Administered 2016-12-10: 4 mg via INTRAVENOUS
  Filled 2016-12-10: qty 2

## 2016-12-10 MED ORDER — VILAZODONE HCL 40 MG PO TABS
40.0000 mg | ORAL_TABLET | Freq: Every day | ORAL | Status: DC
  Administered 2016-12-10 – 2016-12-11 (×2): 40 mg via ORAL
  Filled 2016-12-10 (×3): qty 1

## 2016-12-10 MED ORDER — SUGAMMADEX SODIUM 200 MG/2ML IV SOLN
INTRAVENOUS | Status: AC
  Filled 2016-12-10: qty 2

## 2016-12-10 MED ORDER — SENNA 8.6 MG PO TABS
1.0000 | ORAL_TABLET | Freq: Two times a day (BID) | ORAL | Status: DC
  Administered 2016-12-10 – 2016-12-12 (×3): 8.6 mg via ORAL
  Filled 2016-12-10 (×2): qty 1

## 2016-12-10 MED ORDER — CYCLOSPORINE 0.05 % OP EMUL
1.0000 [drp] | Freq: Every day | OPHTHALMIC | Status: DC | PRN
  Filled 2016-12-10: qty 1

## 2016-12-10 MED ORDER — MIDAZOLAM HCL 5 MG/5ML IJ SOLN
INTRAMUSCULAR | Status: DC | PRN
  Administered 2016-12-10: 2 mg via INTRAVENOUS

## 2016-12-10 MED ORDER — 0.9 % SODIUM CHLORIDE (POUR BTL) OPTIME
TOPICAL | Status: DC | PRN
  Administered 2016-12-10: 1000 mL

## 2016-12-10 MED ORDER — LIDOCAINE 2% (20 MG/ML) 5 ML SYRINGE
INTRAMUSCULAR | Status: AC
  Filled 2016-12-10: qty 10

## 2016-12-10 MED ORDER — BUPIVACAINE HCL (PF) 0.5 % IJ SOLN
INTRAMUSCULAR | Status: DC | PRN
  Administered 2016-12-10: 2 mL

## 2016-12-10 MED ORDER — SODIUM CHLORIDE 0.9 % IJ SOLN
INTRAMUSCULAR | Status: AC
Start: 2016-12-10 — End: 2016-12-10
  Filled 2016-12-10: qty 20

## 2016-12-10 MED ORDER — SODIUM CHLORIDE 0.9 % IR SOLN
Status: DC | PRN
  Administered 2016-12-10 (×2): 500 mL

## 2016-12-10 MED ORDER — CEFAZOLIN IN D5W 1 GM/50ML IV SOLN
1.0000 g | Freq: Three times a day (TID) | INTRAVENOUS | Status: DC
  Administered 2016-12-10 – 2016-12-11 (×2): 1 g via INTRAVENOUS
  Filled 2016-12-10 (×2): qty 50

## 2016-12-10 MED ORDER — ARTIFICIAL TEARS OP OINT
TOPICAL_OINTMENT | OPHTHALMIC | Status: AC
  Filled 2016-12-10: qty 3.5

## 2016-12-10 MED ORDER — ROCURONIUM BROMIDE 100 MG/10ML IV SOLN
INTRAVENOUS | Status: DC | PRN
  Administered 2016-12-10 (×2): 10 mg via INTRAVENOUS
  Administered 2016-12-10: 50 mg via INTRAVENOUS

## 2016-12-10 MED ORDER — SUCCINYLCHOLINE CHLORIDE 200 MG/10ML IV SOSY
PREFILLED_SYRINGE | INTRAVENOUS | Status: AC
  Filled 2016-12-10: qty 10

## 2016-12-10 MED ORDER — NITROGLYCERIN 0.4 MG SL SUBL: 0.4000 mg | SUBLINGUAL_TABLET | SUBLINGUAL | Status: DC | PRN

## 2016-12-10 MED ORDER — SODIUM CHLORIDE 0.9 % IV SOLN: 250.0000 mL | INTRAVENOUS | Status: DC

## 2016-12-10 MED ORDER — OXYCODONE HCL ER 20 MG PO T12A
20.0000 mg | EXTENDED_RELEASE_TABLET | Freq: Two times a day (BID) | ORAL | Status: DC
  Administered 2016-12-10 – 2016-12-12 (×4): 20 mg via ORAL
  Filled 2016-12-10 (×4): qty 1

## 2016-12-10 MED ORDER — PHENYLEPHRINE HCL 10 MG/ML IJ SOLN
INTRAVENOUS | Status: DC | PRN
  Administered 2016-12-10: 50 ug/min via INTRAVENOUS

## 2016-12-10 MED ORDER — BUPIVACAINE-EPINEPHRINE 0.5% -1:200000 IJ SOLN
INTRAMUSCULAR | Status: DC | PRN
  Administered 2016-12-10: 3.5 mL
  Administered 2016-12-10: 11.5 mL

## 2016-12-10 MED ORDER — ROPINIROLE HCL 1 MG PO TABS: 1.0000 mg | ORAL_TABLET | Freq: Every evening | ORAL | Status: DC | PRN

## 2016-12-10 MED ORDER — CHLORHEXIDINE GLUCONATE CLOTH 2 % EX PADS: 6.0000 | MEDICATED_PAD | Freq: Once | CUTANEOUS | Status: DC

## 2016-12-10 MED ORDER — BUPIVACAINE-EPINEPHRINE (PF) 0.5% -1:200000 IJ SOLN
INTRAMUSCULAR | Status: AC
  Filled 2016-12-10: qty 30

## 2016-12-10 MED ORDER — BUDESONIDE 0.5 MG/2ML IN SUSP
0.5000 mg | Freq: Two times a day (BID) | RESPIRATORY_TRACT | Status: DC
  Filled 2016-12-10 (×5): qty 2

## 2016-12-10 MED ORDER — ALPRAZOLAM 0.5 MG PO TABS
0.5000 mg | ORAL_TABLET | Freq: Every evening | ORAL | Status: DC | PRN
  Administered 2016-12-11: 0.5 mg via ORAL
  Filled 2016-12-10: qty 1

## 2016-12-10 MED ORDER — KETAMINE HCL 100 MG/ML IJ SOLN: INTRAMUSCULAR | Status: DC | PRN

## 2016-12-10 MED ORDER — VANCOMYCIN HCL 1000 MG IV SOLR
INTRAVENOUS | Status: AC
  Filled 2016-12-10: qty 1000

## 2016-12-10 MED ORDER — QUETIAPINE FUMARATE 400 MG PO TABS
450.0000 mg | ORAL_TABLET | Freq: Every day | ORAL | Status: DC
  Administered 2016-12-10 – 2016-12-11 (×2): 450 mg via ORAL
  Filled 2016-12-10 (×3): qty 1

## 2016-12-10 MED ORDER — FENTANYL CITRATE (PF) 100 MCG/2ML IJ SOLN
INTRAMUSCULAR | Status: AC
  Filled 2016-12-10: qty 4

## 2016-12-10 MED ORDER — BUPIVACAINE HCL (PF) 0.5 % IJ SOLN
INTRAMUSCULAR | Status: AC
  Filled 2016-12-10: qty 30

## 2016-12-10 MED ORDER — AMLODIPINE BESYLATE 5 MG PO TABS
5.0000 mg | ORAL_TABLET | Freq: Every day | ORAL | Status: DC
  Administered 2016-12-10: 5 mg via ORAL
  Filled 2016-12-10 (×3): qty 1

## 2016-12-10 MED ORDER — PROPOFOL 10 MG/ML IV BOLUS
INTRAVENOUS | Status: DC | PRN
  Administered 2016-12-10: 50 mg via INTRAVENOUS
  Administered 2016-12-10: 150 mg via INTRAVENOUS

## 2016-12-10 MED ORDER — THROMBIN 5000 UNITS EX SOLR
CUTANEOUS | Status: AC
  Filled 2016-12-10: qty 5000

## 2016-12-10 MED ORDER — SUCCINYLCHOLINE CHLORIDE 20 MG/ML IJ SOLN
INTRAMUSCULAR | Status: DC | PRN
  Administered 2016-12-10: 120 mg via INTRAVENOUS

## 2016-12-10 MED ORDER — SUGAMMADEX SODIUM 200 MG/2ML IV SOLN
INTRAVENOUS | Status: DC | PRN
  Administered 2016-12-10: 200 mg via INTRAVENOUS

## 2016-12-10 MED ORDER — LACTATED RINGERS IV SOLN
INTRAVENOUS | Status: DC | PRN
  Administered 2016-12-10 (×3): via INTRAVENOUS

## 2016-12-10 MED ORDER — ARTIFICIAL TEARS OP OINT
TOPICAL_OINTMENT | OPHTHALMIC | Status: DC | PRN
  Administered 2016-12-10: 1 via OPHTHALMIC

## 2016-12-10 MED ORDER — SCOPOLAMINE 1 MG/3DAYS TD PT72
1.0000 | MEDICATED_PATCH | TRANSDERMAL | Status: DC
  Administered 2016-12-10: 1.5 mg via TRANSDERMAL
  Filled 2016-12-10: qty 1

## 2016-12-10 MED ORDER — LINAGLIPTIN 5 MG PO TABS
5.0000 mg | ORAL_TABLET | Freq: Every day | ORAL | Status: DC
  Administered 2016-12-10 – 2016-12-12 (×3): 5 mg via ORAL
  Filled 2016-12-10 (×3): qty 1

## 2016-12-10 MED ORDER — BUPIVACAINE LIPOSOME 1.3 % IJ SUSP
20.0000 mL | Freq: Once | INTRAMUSCULAR | Status: DC
  Filled 2016-12-10: qty 20

## 2016-12-10 MED ORDER — CELECOXIB 200 MG PO CAPS
200.0000 mg | ORAL_CAPSULE | Freq: Two times a day (BID) | ORAL | Status: DC
  Administered 2016-12-10 – 2016-12-12 (×4): 200 mg via ORAL
  Filled 2016-12-10 (×4): qty 1

## 2016-12-10 MED ORDER — PHENYLEPHRINE 40 MCG/ML (10ML) SYRINGE FOR IV PUSH (FOR BLOOD PRESSURE SUPPORT)
PREFILLED_SYRINGE | INTRAVENOUS | Status: AC
Start: 2016-12-10 — End: 2016-12-10
  Filled 2016-12-10: qty 10

## 2016-12-10 MED ORDER — KETAMINE HCL 100 MG/ML IJ SOLN
0.1000 mg/kg/h | INTRAMUSCULAR | Status: AC
  Administered 2016-12-10: 0.1 mg/kg/h via INTRAVENOUS
  Filled 2016-12-10: qty 2

## 2016-12-10 MED ORDER — INSULIN ASPART PROT & ASPART (70-30 MIX) 100 UNIT/ML ~~LOC~~ SUSP
20.0000 [IU] | Freq: Two times a day (BID) | SUBCUTANEOUS | Status: DC
  Administered 2016-12-10 – 2016-12-12 (×4): 20 [IU] via SUBCUTANEOUS
  Filled 2016-12-10: qty 10

## 2016-12-10 MED ORDER — PANTOPRAZOLE SODIUM 40 MG IV SOLR: 40.0000 mg | Freq: Every day | INTRAVENOUS | Status: DC

## 2016-12-10 MED ORDER — PREGABALIN 50 MG PO CAPS: 100.0000 mg | ORAL_CAPSULE | Freq: Two times a day (BID) | ORAL | Status: DC

## 2016-12-10 MED ORDER — SODIUM CHLORIDE 0.9 % IV SOLN
INTRAVENOUS | Status: DC
  Administered 2016-12-10: 21:00:00 via INTRAVENOUS

## 2016-12-10 MED ORDER — CARISOPRODOL 350 MG PO TABS
350.0000 mg | ORAL_TABLET | Freq: Two times a day (BID) | ORAL | Status: DC | PRN
  Administered 2016-12-11 (×2): 350 mg via ORAL
  Filled 2016-12-10 (×2): qty 1

## 2016-12-10 MED ORDER — METFORMIN HCL ER 500 MG PO TB24
1500.0000 mg | ORAL_TABLET | Freq: Every day | ORAL | Status: DC
  Administered 2016-12-10 – 2016-12-11 (×2): 1500 mg via ORAL
  Filled 2016-12-10 (×2): qty 3

## 2016-12-10 MED ORDER — ACETAMINOPHEN 500 MG PO TABS
1000.0000 mg | ORAL_TABLET | Freq: Four times a day (QID) | ORAL | Status: DC
  Administered 2016-12-10 – 2016-12-12 (×7): 1000 mg via ORAL
  Filled 2016-12-10 (×7): qty 2

## 2016-12-10 MED ORDER — ONDANSETRON HCL 4 MG/2ML IJ SOLN
INTRAMUSCULAR | Status: DC | PRN
  Administered 2016-12-10: 4 mg via INTRAVENOUS

## 2016-12-10 MED ORDER — FLEET ENEMA 7-19 GM/118ML RE ENEM: 1.0000 | ENEMA | Freq: Once | RECTAL | Status: DC | PRN

## 2016-12-10 MED ORDER — CEFAZOLIN SODIUM-DEXTROSE 2-4 GM/100ML-% IV SOLN
2.0000 g | INTRAVENOUS | Status: AC
  Administered 2016-12-10 (×2): 2 g via INTRAVENOUS
  Filled 2016-12-10: qty 100

## 2016-12-10 MED ORDER — PHENYLEPHRINE HCL 10 MG/ML IJ SOLN
INTRAMUSCULAR | Status: DC | PRN
  Administered 2016-12-10: 80 ug via INTRAVENOUS
  Administered 2016-12-10 (×2): 40 ug via INTRAVENOUS
  Administered 2016-12-10: 80 ug via INTRAVENOUS
  Administered 2016-12-10 (×2): 40 ug via INTRAVENOUS

## 2016-12-10 MED ORDER — ALBUTEROL SULFATE (2.5 MG/3ML) 0.083% IN NEBU: 2.5000 mg | INHALATION_SOLUTION | Freq: Four times a day (QID) | RESPIRATORY_TRACT | Status: DC | PRN

## 2016-12-10 MED ORDER — THROMBIN 20000 UNITS EX SOLR
CUTANEOUS | Status: DC | PRN
  Administered 2016-12-10: 20 mL via TOPICAL

## 2016-12-10 MED ORDER — BISACODYL 5 MG PO TBEC: 5.0000 mg | DELAYED_RELEASE_TABLET | Freq: Every day | ORAL | Status: DC | PRN

## 2016-12-10 MED ORDER — OXYCODONE HCL 5 MG PO TABS
5.0000 mg | ORAL_TABLET | ORAL | Status: DC | PRN
  Administered 2016-12-10 – 2016-12-12 (×8): 10 mg via ORAL
  Filled 2016-12-10 (×8): qty 2

## 2016-12-10 MED ORDER — CARVEDILOL 6.25 MG PO TABS
12.5000 mg | ORAL_TABLET | Freq: Two times a day (BID) | ORAL | Status: DC
  Administered 2016-12-10 – 2016-12-12 (×4): 12.5 mg via ORAL
  Filled 2016-12-10 (×4): qty 2

## 2016-12-10 MED ORDER — ZOLPIDEM TARTRATE 5 MG PO TABS: 5.0000 mg | ORAL_TABLET | Freq: Every evening | ORAL | Status: DC | PRN

## 2016-12-10 MED ORDER — HYDROMORPHONE HCL 1 MG/ML IJ SOLN
0.2500 mg | INTRAMUSCULAR | Status: DC | PRN
  Administered 2016-12-10 (×4): 0.5 mg via INTRAVENOUS

## 2016-12-10 MED ORDER — HYDROMORPHONE HCL 1 MG/ML IJ SOLN
INTRAMUSCULAR | Status: DC | PRN
  Administered 2016-12-10: 1 mg via INTRAVENOUS

## 2016-12-10 MED ORDER — THROMBIN 20000 UNITS EX SOLR
CUTANEOUS | Status: AC
  Filled 2016-12-10: qty 20000

## 2016-12-10 MED ORDER — ROCURONIUM BROMIDE 50 MG/5ML IV SOSY
PREFILLED_SYRINGE | INTRAVENOUS | Status: AC
  Filled 2016-12-10: qty 10

## 2016-12-10 MED ORDER — LIDOCAINE-EPINEPHRINE 2 %-1:100000 IJ SOLN
INTRAMUSCULAR | Status: DC | PRN
  Administered 2016-12-10: 11.5 mL
  Administered 2016-12-10: 3.5 mL

## 2016-12-10 MED ORDER — ACETAMINOPHEN 10 MG/ML IV SOLN
INTRAVENOUS | Status: DC | PRN
  Administered 2016-12-10: 1000 mg via INTRAVENOUS

## 2016-12-10 MED ORDER — ALBUMIN HUMAN 5 % IV SOLN
INTRAVENOUS | Status: DC | PRN
  Administered 2016-12-10: 11:00:00 via INTRAVENOUS

## 2016-12-10 MED ORDER — ATORVASTATIN CALCIUM 20 MG PO TABS
80.0000 mg | ORAL_TABLET | Freq: Every day | ORAL | Status: DC
  Administered 2016-12-11 – 2016-12-12 (×2): 80 mg via ORAL
  Filled 2016-12-10 (×2): qty 4

## 2016-12-10 MED ORDER — LISINOPRIL 20 MG PO TABS
20.0000 mg | ORAL_TABLET | Freq: Every day | ORAL | Status: DC
  Administered 2016-12-10: 20 mg via ORAL
  Filled 2016-12-10: qty 1

## 2016-12-10 MED ORDER — PROMETHAZINE HCL 25 MG/ML IJ SOLN: 6.2500 mg | INTRAMUSCULAR | Status: DC | PRN

## 2016-12-10 MED ORDER — LIDOCAINE-EPINEPHRINE (PF) 2 %-1:200000 IJ SOLN
INTRAMUSCULAR | Status: AC
  Filled 2016-12-10: qty 20

## 2016-12-10 SURGICAL SUPPLY — 114 items
BAG DECANTER FOR FLEXI CONT (MISCELLANEOUS) ×4 IMPLANT
BENZOIN TINCTURE PRP APPL 2/3 (GAUZE/BANDAGES/DRESSINGS) ×4 IMPLANT
BIT DRILL LONG 3.0X30 (BIT) IMPLANT
BIT DRILL LONG 3X80 (BIT) ×3 IMPLANT
BIT DRILL LONG 4X80 (BIT) IMPLANT
BIT DRILL SHORT 3.0X30 (BIT) IMPLANT
BIT DRILL SHORT 3.0X30MM (BIT)
BIT DRILL SHORT 3X80 (BIT) IMPLANT
BLADE CLIPPER SURG (BLADE) IMPLANT
BLADE SURG 11 STRL SS (BLADE) ×4 IMPLANT
BONE CANC CHIPS 20CC PCAN1/4 (Bone Implant) IMPLANT
BONE MATRIX OSTEOCEL PRO MED (Bone Implant) ×8 IMPLANT
BUR MATCHSTICK NEURO 3.0 LAGG (BURR) ×4 IMPLANT
BUR ROUND FLUTED 5 RND (BURR) ×3 IMPLANT
BUR ROUND FLUTED 5MM RND (BURR) ×1
CANISTER SUCT 3000ML PPV (MISCELLANEOUS) ×8 IMPLANT
CARTRIDGE OIL MAESTRO DRILL (MISCELLANEOUS) ×4 IMPLANT
CHIPS CANC BONE 20CC PCAN1/4 (Bone Implant) IMPLANT
CHLORAPREP W/TINT 26ML (MISCELLANEOUS) ×8 IMPLANT
CONT SPEC 4OZ CLIKSEAL STRL BL (MISCELLANEOUS) ×8 IMPLANT
COROENT XLW 15 DEG 12X22X55MM (Orthopedic Implant) ×3 IMPLANT
COVER BACK TABLE 24X17X13 BIG (DRAPES) IMPLANT
DECANTER SPIKE VIAL GLASS SM (MISCELLANEOUS) ×8 IMPLANT
DERMABOND ADVANCED (GAUZE/BANDAGES/DRESSINGS) ×4
DERMABOND ADVANCED .7 DNX12 (GAUZE/BANDAGES/DRESSINGS) ×4 IMPLANT
DIFFUSER DRILL AIR PNEUMATIC (MISCELLANEOUS) ×8 IMPLANT
DRAPE C-ARM 42X72 X-RAY (DRAPES) ×12 IMPLANT
DRAPE C-ARMOR (DRAPES) ×8 IMPLANT
DRAPE LAPAROTOMY 100X72X124 (DRAPES) ×4 IMPLANT
DRAPE MICROSCOPE LEICA (MISCELLANEOUS) IMPLANT
DRAPE POUCH INSTRU U-SHP 10X18 (DRAPES) ×8 IMPLANT
DRAPE SHEET LG 3/4 BI-LAMINATE (DRAPES) ×4 IMPLANT
DRAPE SURG 17X23 STRL (DRAPES) ×4 IMPLANT
DRSG OPSITE POSTOP 3X4 (GAUZE/BANDAGES/DRESSINGS) ×4 IMPLANT
DRSG OPSITE POSTOP 4X8 (GAUZE/BANDAGES/DRESSINGS) ×4 IMPLANT
ELECT BLADE 4.0 EZ CLEAN MEGAD (MISCELLANEOUS) ×4
ELECT REM PT RETURN 9FT ADLT (ELECTROSURGICAL) ×8
ELECTRODE BLDE 4.0 EZ CLN MEGD (MISCELLANEOUS) ×2 IMPLANT
ELECTRODE REM PT RTRN 9FT ADLT (ELECTROSURGICAL) ×4 IMPLANT
GAUZE SPONGE 4X4 12PLY STRL (GAUZE/BANDAGES/DRESSINGS) ×4 IMPLANT
GAUZE SPONGE 4X4 16PLY XRAY LF (GAUZE/BANDAGES/DRESSINGS) IMPLANT
GLOVE BIOGEL M 7.0 STRL (GLOVE) ×8 IMPLANT
GLOVE BIOGEL PI IND STRL 7.0 (GLOVE) ×10 IMPLANT
GLOVE BIOGEL PI IND STRL 7.5 (GLOVE) ×8 IMPLANT
GLOVE BIOGEL PI INDICATOR 7.0 (GLOVE) ×10
GLOVE BIOGEL PI INDICATOR 7.5 (GLOVE) ×8
GLOVE ECLIPSE 7.5 STRL STRAW (GLOVE) ×4 IMPLANT
GLOVE EXAM NITRILE LRG STRL (GLOVE) IMPLANT
GLOVE EXAM NITRILE XL STR (GLOVE) IMPLANT
GLOVE EXAM NITRILE XS STR PU (GLOVE) IMPLANT
GLOVE SS BIOGEL STRL SZ 7.5 (GLOVE) ×8 IMPLANT
GLOVE SUPERSENSE BIOGEL SZ 7.5 (GLOVE) ×8
GOWN STRL REUS W/ TWL LRG LVL3 (GOWN DISPOSABLE) ×6 IMPLANT
GOWN STRL REUS W/ TWL XL LVL3 (GOWN DISPOSABLE) ×8 IMPLANT
GOWN STRL REUS W/TWL 2XL LVL3 (GOWN DISPOSABLE) IMPLANT
GOWN STRL REUS W/TWL LRG LVL3 (GOWN DISPOSABLE) ×6
GOWN STRL REUS W/TWL XL LVL3 (GOWN DISPOSABLE) ×8
GUIDEWIRE NITINOL BEVEL TIP (WIRE) ×4 IMPLANT
HEMOSTAT POWDER KIT SURGIFOAM (HEMOSTASIS) ×8 IMPLANT
KIT BASIN OR (CUSTOM PROCEDURE TRAY) ×8 IMPLANT
KIT DILATOR XLIF 5 (KITS) ×2 IMPLANT
KIT INFUSE XX SMALL 0.7CC (Orthopedic Implant) ×4 IMPLANT
KIT ROOM TURNOVER OR (KITS) ×8 IMPLANT
KIT SPINE MAZOR X ROBO DISP (MISCELLANEOUS) ×3 IMPLANT
KIT SURGICAL ACCESS MAXCESS 4 (KITS) ×4 IMPLANT
KIT XLIF (KITS) ×2
MODULE NVM5 NEXT GEN EMG (NEEDLE) ×4 IMPLANT
NEEDLE HYPO 21X1.5 SAFETY (NEEDLE) ×16 IMPLANT
NEEDLE HYPO 25X1 1.5 SAFETY (NEEDLE) ×8 IMPLANT
NS IRRIG 1000ML POUR BTL (IV SOLUTION) ×8 IMPLANT
OIL CARTRIDGE MAESTRO DRILL (MISCELLANEOUS) ×8
PACK LAMINECTOMY NEURO (CUSTOM PROCEDURE TRAY) ×8 IMPLANT
PACK UNIVERSAL I (CUSTOM PROCEDURE TRAY) ×4 IMPLANT
PAD ARMBOARD 7.5X6 YLW CONV (MISCELLANEOUS) ×20 IMPLANT
PATTIES SURGICAL .5X1.5 (GAUZE/BANDAGES/DRESSINGS) ×4 IMPLANT
PIN HEAD 2.5X60MM (PIN) IMPLANT
PUTTY BONE ATTRAX 5CC STRIP (Putty) ×4 IMPLANT
ROD RELINE O TI 6.0X35MM LORDO (Screw) ×8 IMPLANT
ROD RELINE TI 6.0X300MM STRT (Rod) ×4 IMPLANT
RUBBERBAND STERILE (MISCELLANEOUS) IMPLANT
SCREW LOCK RELINE TULIP 6.0MM (Screw) ×16 IMPLANT
SCREW SCHANZ SA 4.0MM ×4 IMPLANT
SCREW SHANK MAS MOD 6.5X50MM (Screw) ×8 IMPLANT
SCREW SHANK RELINE 6.5X45MM 2C (Screw) ×8 IMPLANT
SLEEVE SURGICAL STRL (SLEEVE) ×3 IMPLANT
SPINE TULIP RELINE MOD (Neuro Prosthesis/Implant) ×16 IMPLANT
SPONGE LAP 4X18 X RAY DECT (DISPOSABLE) IMPLANT
SPONGE NEURO XRAY DETECT 1X3 (DISPOSABLE) ×4 IMPLANT
SPONGE SURGIFOAM ABS GEL 100 (HEMOSTASIS) ×4 IMPLANT
STAPLER VISISTAT 35W (STAPLE) ×4 IMPLANT
STRIP SURGICAL 1 X 6 IN (GAUZE/BANDAGES/DRESSINGS) IMPLANT
STRIP SURGICAL 1/2 X 6 IN (GAUZE/BANDAGES/DRESSINGS) IMPLANT
STRIP SURGICAL 1/4 X 6 IN (GAUZE/BANDAGES/DRESSINGS) IMPLANT
STRIP SURGICAL 3/4 X 6 IN (GAUZE/BANDAGES/DRESSINGS) IMPLANT
SURGICAL SYSTEM STERILE SLEEVE ×3 IMPLANT
SUT STRATAFIX MNCRL+ 3-0 PS-2 (SUTURE) ×6
SUT STRATAFIX MONOCRYL 3-0 (SUTURE) ×6
SUT VIC AB 0 CT1 18XCR BRD8 (SUTURE) ×6 IMPLANT
SUT VIC AB 0 CT1 8-18 (SUTURE) ×6
SUT VIC AB 1 CT1 18XBRD ANBCTR (SUTURE) ×4 IMPLANT
SUT VIC AB 1 CT1 8-18 (SUTURE) ×4
SUT VIC AB 2-0 CT1 18 (SUTURE) ×20 IMPLANT
SUT VIC AB 3-0 SH 8-18 (SUTURE) ×12 IMPLANT
SUT VIC AB 4-0 PS2 27 (SUTURE) ×4 IMPLANT
SUTURE STRATFX MNCRL+ 3-0 PS-2 (SUTURE) ×6 IMPLANT
SYR 20CC LL (SYRINGE) ×8 IMPLANT
SYR 30ML LL (SYRINGE) ×8 IMPLANT
TOWEL OR 17X24 6PK STRL BLUE (TOWEL DISPOSABLE) ×8 IMPLANT
TOWEL OR 17X26 10 PK STRL BLUE (TOWEL DISPOSABLE) ×8 IMPLANT
TRAY FOLEY W/METER SILVER 16FR (SET/KITS/TRAYS/PACK) ×4 IMPLANT
TUBE CONNECTING 12'X1/4 (SUCTIONS) ×1
TUBE CONNECTING 12X1/4 (SUCTIONS) ×3 IMPLANT
TULIP SPINE RELINE MOD (Neuro Prosthesis/Implant) ×8 IMPLANT
WATER STERILE IRR 1000ML POUR (IV SOLUTION) ×12 IMPLANT

## 2016-12-10 NOTE — Anesthesia Postprocedure Evaluation (Addendum)
Anesthesia Post Note  Patient: Jennifer [name]  Procedure(s) Performed: Procedure(s) (LRB): LUMBAR FOUR-FIVE  Anterior lateral lumbar interbody fusion with LUMBAR FOUR-FIVE  Posterolateral fusion/Re-operative laminectomy LUMBAR FIVE-SACRUM ONE Bilateral, Laminectomy at LUMBAR FOUR-FIVE , 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  and Left LUMBAR FOUR-FIVE  with Mazor (N/A) Extension of pedicle screw fixation to LUMBAR FOUR (N/A) APPLICATION OF ROBOTIC ASSISTANCE FOR SPINAL PROCEDURE (N/A)  Patient location during evaluation: PACU Anesthesia Type: General Level of consciousness: sedated Pain management: pain level controlled Vital Signs Assessment: post-procedure vital signs reviewed and stable Respiratory status: spontaneous breathing and respiratory function stable Cardiovascular status: stable Anesthetic complications: no       Last Vitals:  Vitals:   12/10/16 1530 12/10/16 1545  BP: (!) 168/85 (!) 165/91  Pulse: 90 90  Resp: 12 13  Temp:  36.4 C    Last Pain:  Vitals:   12/10/16 1545  TempSrc:   PainSc: Asleep                 Aurther Harlin DANIEL

## 2016-12-10 NOTE — H&P (Signed)
CC: Back and leg pain No chief complaint on file.   HPI: Jennifer [name] is a 61 y.o. female with lumbosacral spondylosis with radiculopathy presents with back and leg pain and some dorsiflexion weakness.  She comes today for adjacent level fusion above her prior fusion.  PMH: Past Medical History:  Diagnosis Date  . Anemia 1985   bled during a cervical biopsy, led to bleed transfusion   . Anxiety   . Arthritis    pt. reports that her neck is stiff   . Bipolar depression ([place])   . Blood dyscrasia    reported hx [name] '85; saw hematologist Dr. Janese Banks 10/2016, VWD work-up WNL, not felt to have a primary bleeding disorder   . CAD (coronary artery disease)   . 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 COPD (chronic obstructive pulmonary disease) ([place])    dr. Gar [facility]    PCP  . Depression   . Depression with anxiety   . Diabetes mellitus   . Dyspnea    W/ EXERTION   . Enlarged liver   . Family history of adverse reaction to anesthesia    father woke up slowly  . Fibromyalgia   . Generalized anxiety disorder   . GERD (gastroesophageal reflux disease)   . Hyperlipidemia   . Hypertension   . Low back pain   . Migraines   . Neuropathy ([place])   . Persistent disorder of initiating or maintaining sleep   . Von Willebrand disease ([place])     PSH: Past Surgical History:  Procedure Laterality Date  . ABDOMINAL HYSTERECTOMY    . BACK SURGERY  2007   lumbar  . BREAST IMPLANT REMOVAL    . CARDIAC CATHETERIZATION     CAD,PCI of LAD, Cypher stent 2.5-28mm  . CARDIAC CATHETERIZATION     PCI of mid-LAD in stent restenosis with a drug-eluting stent  . CARDIAC CATHETERIZATION  06/03/13   [facility];MID LAD 100 % STENOSIS; PRIOR STENT; MID RCA 40 % STENOSIS  . CORONARY ARTERY BYPASS GRAFT   08-12-2011   CABG x 3  . CORONARY STENT PLACEMENT    . LUMBAR FUSION     L1-S1  . NASAL SINUS SURGERY  1997   tuirbinate reduction     SH: Social  History  Substance Use Topics  . Smoking status: Former Smoker    Years: 16.00    Types: Cigarettes    Quit date: 06/20/2010  . Smokeless tobacco: Never Used  . Alcohol use No    MEDS: Prior to Admission medications   Medication Sig Start Date End Date Taking? Authorizing Provider  albuterol (PROAIR HFA) 108 (90 Base) MCG/ACT inhaler Inhale 2 puffs into the lungs every 6 (six) hours as needed for wheezing or shortness of breath.  07/02/16 07/02/17 Yes Historical Provider, MD  ALPRAZolam Duanne Moron) 0.5 MG tablet Take 1 tablet (0.5 mg total) by mouth at bedtime as needed for anxiety. Patient taking differently: Take 0.5 mg by mouth at bedtime as needed for anxiety or sleep.  10/04/16  Yes Rainey Pines, MD  amLODipine (NORVASC) 5 MG tablet Take 1 tablet (5 mg total) by mouth 2 (two) times daily. Patient taking differently: Take 5 mg by mouth daily at 2 PM.  01/23/16  Yes Minna Merritts, MD  atorvastatin (LIPITOR) 80 MG tablet Take 80 mg by mouth daily after breakfast.  05/07/16  Yes Historical Provider,  MD  Blood Glucose Monitoring Suppl (FREESTYLE FREEDOM LITE) W/DEVICE KIT Use 3 (three) times daily. 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 Dx Code: 250.00 07/19/15  Yes Historical Provider, MD  carisoprodol (SOMA) 350 MG tablet Limit 1 tablet by mouth per day or twice per day if tolerated Patient taking differently: Take 350 mg by mouth 2 (two) times daily as needed for muscle spasms. IF TOLERATED (Limit of 1-2 daily) 06/25/16  Yes Mohammed Kindle, MD  carvedilol (COREG) 12.5 MG tablet Take 1 tablet (12.5 mg total) by mouth 2 (two) times daily with a meal. 07/16/13  Yes Minna Merritts, MD  fluticasone (FLONASE) 50 MCG/ACT nasal spray Place 1 spray into both nostrils 2 (two) times daily as needed for allergies. 07/21/15  Yes Historical Provider, MD  fluticasone (FLOVENT HFA) 110 MCG/ACT inhaler Inhale 2 puffs into the lungs 2 (two) times daily. 09/24/16 09/24/17 Yes Historical Provider, MD  HUMULIN 70/30  KWIKPEN (70-30) 100 UNIT/ML PEN Inject 20 Units into the skin 2 (two) times daily.  05/07/16  Yes Historical Provider, MD  HYDROcodone-acetaminophen (NORCO) 10-325 MG tablet Limit 3-6 tabs by mouth per day if tolerated Patient taking differently: Take 1 tablet by mouth every 4 (four) hours as needed (FOR PAIN (DO NOT EXCEED 6 TABLETS DAILY)).  06/25/16  Yes Mohammed Kindle, MD  lisinopril (PRINIVIL,ZESTRIL) 20 MG tablet TAKE 1 TABLET DAILY Patient taking differently: TAKE 1 TABLET (20 MG) BY MOUTH DAILY IN THE MORNING. 06/05/16  Yes Minna Merritts, MD  lithium carbonate (ESKALITH) 450 MG CR tablet Take 1 tablet (450 mg total) by mouth at bedtime. Patient taking differently: Take 450 mg by mouth daily at 2 PM.  10/04/16  Yes Rainey Pines, MD  metFORMIN (GLUCOPHAGE-XR) 750 MG 24 hr tablet Take 1,500 mg by mouth daily with supper. 09/24/16 09/24/17 Yes Historical Provider, MD  omeprazole (PRILOSEC) 20 MG capsule Take 20 mg by mouth daily.    Yes Historical Provider, MD  QUEtiapine (SEROQUEL) 300 MG tablet Take 1.5 tablet at bed time. Patient taking differently: Take 450 mg by mouth at bedtime.  10/04/16  Yes Rainey Pines, MD  RESTASIS 0.05 % ophthalmic emulsion Place 1 drop into both eyes daily as needed (for dryness).  09/16/15  Yes Historical Provider, MD  rOPINIRole (REQUIP) 0.5 MG tablet Take 1 mg by mouth at bedtime as needed (for restless legs).  06/04/16 06/04/17 Yes Historical Provider, MD  sitaGLIPtin (JANUVIA) 100 MG tablet Take 100 mg by mouth daily. 09/24/16 09/24/17 Yes Historical Provider, MD  Vilazodone HCl (VIIBRYD) 40 MG TABS Take 1 tablet (40 mg total) by mouth daily. Patient taking differently: Take 40 mg by mouth daily at 2 PM.  10/04/16  Yes Rainey Pines, MD  zolpidem (AMBIEN) 5 MG tablet Take 1 tablet (5 mg total) by mouth at bedtime. 10/04/16  Yes Rainey Pines, MD  albuterol-ipratropium (COMBIVENT) 18-103 MCG/ACT inhaler Inhale 2 puffs into the lungs daily as needed for wheezing or shortness of  breath.     Historical Provider, MD  nitroGLYCERIN (NITROSTAT) 0.4 MG SL tablet Place 1 tablet (0.4 mg total) under the tongue every 5 (five) minutes as needed for chest pain. 06/09/15   Minna Merritts, MD  pregabalin (LYRICA) 50 MG capsule Limit 1 tablet by mouth per day or twice per day if tolerated Patient taking differently: Take 50 mg by mouth 2 (two) times daily. Pt. Reports that she is holding dose since last month 05/28/16   Mohammed Kindle, MD  Spacer/Aero-Holding Chambers (E-Z SPACER) inhaler Reported on 01/15/2016 10/02/15   Historical Provider, MD    ALLERGY: Allergies  Allergen Reactions  . No Known Allergies     ROS: ROS  NEUROLOGIC EXAM: Awake, alert, oriented Memory and concentration grossly intact Speech fluent, appropriate CN grossly intact Motor exam: Upper Extremities Deltoid Bicep Tricep Grip  Right 5/5 5/5 5/5 5/5  Left 5/5 5/5 5/5 5/5   Lower Extremity IP Quad PF DF EHL  Right 5/5 5/5 5/5 4/5 4/5  Left 5/5 5/5 5/5 4/5 4/5   Sensation grossly intact to LT  IMAGING: No new pertinent imaging  IMPRESSION: - 61 y.o. female with lumbosacral spondylosis with radiculopathy and deficits as above.  PLAN: - L4-5 lateral interbody fusion, extension of posterior fixation to L4, resection of synovial cysts. - We have discussed the risks, benefits, and alternatives to surgery in clinic and reviewed those today.  I have answered her questions.  She wishes to proceed.

## 2016-12-10 NOTE — Brief Op Note (Signed)
12/10/2016  1:57 PM  PATIENT:  Jennifer [name]  61 y.o. female  PRE-OPERATIVE DIAGNOSIS:  Lumbar Radiculopathy  POST-OPERATIVE DIAGNOSIS:  Lumbar Radiculopathy  PROCEDURE:  Procedure(s): LUMBAR FOUR-FIVE  Anterior lateral lumbar interbody fusion with LUMBAR FOUR-FIVE  Posterolateral fusion/Re-operative laminectomy LUMBAR FIVE-SACRUM ONE Bilateral, Laminectomy at LUMBAR FOUR-FIVE , 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  and Left LUMBAR FOUR-FIVE  with Mazor (N/A) Extension of pedicle screw fixation to LUMBAR FOUR (N/A) APPLICATION OF ROBOTIC ASSISTANCE FOR SPINAL PROCEDURE (N/A)  SURGEON:  Surgeon(s) and Role:    * Kevan Ny Abdulrahim Siddiqi, MD - Primary    * Erline Levine, MD - Assisting  PHYSICIAN ASSISTANT:   ASSISTANTS: Erline Levine, MD   ANESTHESIA:   local and general  EBL:  Total I/O In: [id number] [I.V.:2925; IV Piggyback:250] Out: 730 [Urine:430; Blood:300]  BLOOD ADMINISTERED:none  DRAINS: none   LOCAL MEDICATIONS USED:  MARCAINE    and LIDOCAINE   SPECIMEN:  No Specimen and Excision  DISPOSITION OF SPECIMEN:  PATHOLOGY  COUNTS:  YES  TOURNIQUET:  * No tourniquets in log *  DICTATION: .Dragon Dictation  PLAN OF CARE: Admit to inpatient   PATIENT DISPOSITION:  PACU - hemodynamically stable.   Delay start of Pharmacological VTE agent (>24hrs) due to surgical blood loss or risk of bleeding: yes

## 2016-12-10 NOTE — Evaluation (Signed)
Physical Therapy Evaluation Patient Details Name: Jennifer [name] MRN: [id number] DOB: Mar 30, 1956 Today's Date: 12/10/2016   History of Present Illness  Pt is 61 y.o. 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 PMH includes lumbar fusion L1-S1 in 2007.   Clinical Impression  Pt presents with increased pain and post-surgical deficits s/p above. Pt required maximal encouragement to participate with PT, and was able to amb to chair with RW and minA. Pt edu: precautions, brace application, logroll technique, importance of mobility. PTA, pt was indep with all functional mobility living at home with her husband and multiple family members. Pt would benefit from continued acute PT with HHPT follow-up to maximize functional mobility and independence.     Follow Up Recommendations Home health PT    Equipment Recommendations  None recommended by PT    Recommendations for Other Services       Precautions / Restrictions Precautions Precautions: Back Precaution Booklet Issued: Yes (comment) Required Braces or Orthoses: Spinal Brace Spinal Brace: Lumbar corset;Applied in sitting position Restrictions Weight Bearing Restrictions: No      Mobility  Bed Mobility Overal bed mobility: Needs Assistance Bed Mobility: Rolling;Sidelying to Sit Rolling: Min guard Sidelying to sit: Min assist       General bed mobility comments: Pt required multiple verbal cues for technique and sequencing of supine-to-sit transition while maintaining precautions.  Transfers Overall transfer level: Needs assistance Equipment used: Rolling walker (2 wheeled) Transfers: Sit to/from Stand Sit to Stand: Min assist         General transfer comment: Pt able to partially stand with minA and no RW, but unable to maintain secondary to pain. Then able to fully  stand with minA and RW.   Ambulation/Gait Ambulation/Gait assistance: Min assist Ambulation Distance (Feet): 15 Feet Assistive device: Rolling walker (2 wheeled) Gait Pattern/deviations: Step-to pattern;Decreased step length - right;Decreased step length - left Gait velocity: Decreased gait velocity.  Gait velocity interpretation: Below normal speed for age/gender General Gait Details: Amb in room to chair with min guard and RW requiring cues for sequencing with RW navigation.   Stairs            Wheelchair Mobility    Modified Rankin (Stroke Patients Only)       Balance Overall balance assessment: Needs assistance Sitting-balance support: No upper extremity supported;Feet supported Sitting balance-Leahy Scale: Fair Sitting balance - Comments: Pt able to sit EOB using BUE for brace application.    Standing balance support: Bilateral upper extremity supported;During functional activity Standing balance-Leahy Scale: Poor Standing balance comment: Pt required RW for standing.                              Pertinent Vitals/Pain Pain Assessment: 0-10 Pain Score: 8  Pain Location: Back Pain Intervention(s): Limited activity within patient's tolerance;Monitored during session;Repositioned (RN aware)    Home Living Family/patient expects to be discharged to:: Private residence Living Arrangements: Spouse/significant other;Children;Other relatives Available Help at Discharge: Family Type of Home: House Home Access: Stairs to enter Entrance Stairs-Rails: None Entrance Stairs-Number of Steps: 6 Home Layout: Two level;Able to live on main level with bedroom/bathroom Home Equipment: Walker - 2 wheels      Prior Function Level of Independence: Independent               Hand Dominance  Extremity/Trunk Assessment   Upper Extremity Assessment Upper Extremity Assessment: Defer to OT evaluation    Lower Extremity Assessment Lower Extremity  Assessment: Generalized weakness (s/p surgery)    Cervical / Trunk Assessment Cervical / Trunk Assessment: Kyphotic  Communication   Communication: No difficulties  Cognition Arousal/Alertness: Suspect due to medications;Lethargic Behavior During Therapy: WFL for tasks assessed/performed Overall Cognitive Status: Impaired/Different from baseline Area of Impairment: Problem solving;Memory   Current Attention Level: Sustained Memory:  (Could not recall wedding anniversary; difficulty recalling home set-up) Following Commands: Follows one step commands inconsistently;Follows one step commands with increased time     Problem Solving: Difficulty sequencing;Requires verbal cues;Decreased initiation General Comments: Pt had difficulty multi-tasking.     General Comments General comments (skin integrity, edema, etc.): Pt c/o nausea after ambulation; RN aware.     Exercises     Assessment/Plan    PT Assessment Patient needs continued PT services  PT Problem List Decreased strength;Decreased mobility;Decreased knowledge of precautions;Decreased range of motion;Decreased activity tolerance;Decreased balance;Pain          PT Treatment Interventions Gait training;Stair training;Functional mobility training;Therapeutic activities;Therapeutic exercise;Patient/family education    PT Goals (Current goals can be found in the Care Plan section)  Acute Rehab PT Goals Patient Stated Goal: Return home and decreased pain.  PT Goal Formulation: With patient Time For Goal Achievement: 12/10/16 Potential to Achieve Goals: Good    Frequency Min 5X/week   Barriers to discharge        Co-evaluation               End of Session Equipment Utilized During Treatment: Gait belt;Back brace Activity Tolerance: Patient limited by pain Patient left: in chair;with call bell/phone within reach Nurse Communication: Mobility status         Time: [id number] PT Time Calculation (min) (ACUTE  ONLY): 27 min   Charges:   PT Evaluation $PT Eval Low Complexity: 1 Procedure PT Treatments $Gait Training: 8-22 mins   PT G Codes:       Enis Gash, SPT Office-[phone]  Mabeline Caras 12/10/2016, 5:51 PM

## 2016-12-10 NOTE — Progress Notes (Signed)
Pt has developed a blister, full of serous clear fluid that has risen on the left side of her chin; dressed with gauze and tape.

## 2016-12-10 NOTE — Anesthesia Procedure Notes (Signed)
Procedure Name: Intubation Date/Time: 12/10/2016 7:38 AM Performed by: Neldon Newport Pre-anesthesia Checklist: Timeout performed, Patient being monitored, Suction available, Emergency Drugs available and Patient identified Patient Re-evaluated:Patient Re-evaluated prior to inductionOxygen Delivery Method: Circle system utilized Preoxygenation: Pre-oxygenation with 100% oxygen Intubation Type: IV induction Ventilation: Mask ventilation without difficulty Laryngoscope Size: Mac and 3 Grade View: Grade I Tube type: Oral Tube size: 7.0 mm Number of attempts: 1 Placement Confirmation: breath sounds checked- equal and bilateral,  positive ETCO2 and ETT inserted through vocal cords under direct vision Secured at: 21 cm Tube secured with: Tape Dental Injury: Teeth and Oropharynx as per pre-operative assessment

## 2016-12-10 NOTE — Transfer of Care (Signed)
Immediate Anesthesia Transfer of Care Note  Patient: Jennifer [name]  Procedure(s) Performed: Procedure(s): LUMBAR FOUR-FIVE  Anterior lateral lumbar interbody fusion with LUMBAR FOUR-FIVE  Posterolateral fusion/Re-operative laminectomy LUMBAR FIVE-SACRUM ONE Bilateral, Laminectomy at LUMBAR FOUR-FIVE , 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  and Left LUMBAR FOUR-FIVE  with Mazor (N/A) Extension of pedicle screw fixation to LUMBAR FOUR (N/A) APPLICATION OF ROBOTIC ASSISTANCE FOR SPINAL PROCEDURE (N/A)  Patient Location: PACU  Anesthesia Type:General  Level of Consciousness: awake, alert  and oriented  Airway & Oxygen Therapy: Patient Spontanous Breathing and Patient connected to face mask oxygen  Post-op Assessment: Report given to RN, Post -op Vital signs reviewed and stable and Patient moving all extremities X 4  Post vital signs: Reviewed and stable  Last Vitals:  Vitals:   12/10/16 0626 12/10/16 1415  BP:  (!) 176/89  Pulse:  94  Resp:  19  Temp: 36.6 C (P) 36.7 C    Last Pain:  Vitals:   12/10/16 0626  TempSrc: Oral  PainSc:       Patients Stated Pain Goal: 2 ([id number])  Complications: No apparent anesthesia complications

## 2016-12-11 LAB — CBC
HCT: 31.9 % — ABNORMAL LOW (ref 36.0–46.0)
Hemoglobin: 10 g/dL — ABNORMAL LOW (ref 12.0–15.0)
MCH: 28.7 pg (ref 26.0–34.0)
MCHC: 31.3 g/dL (ref 30.0–36.0)
MCV: 91.4 fL (ref 78.0–100.0)
PLATELETS: 207 10*3/uL (ref 150–400)
RBC: 3.49 MIL/uL — ABNORMAL LOW (ref 3.87–5.11)
RDW: 14.6 % (ref 11.5–15.5)
WBC: 7.8 10*3/uL (ref 4.0–10.5)

## 2016-12-11 LAB — BASIC METABOLIC PANEL
ANION GAP: 10 (ref 5–15)
BUN: 8 mg/dL (ref 6–20)
CALCIUM: 8.3 mg/dL — AB (ref 8.9–10.3)
CO2: 22 mmol/L (ref 22–32)
CREATININE: 0.82 mg/dL (ref 0.44–1.00)
Chloride: 106 mmol/L (ref 101–111)
GFR calc Af Amer: >60 mL/min (ref >60)
GLUCOSE: 194 mg/dL — AB (ref 65–99)
Potassium: 3.9 mmol/L (ref 3.5–5.1)
Sodium: 138 mmol/L (ref 135–145)

## 2016-12-11 LAB — GLUCOSE, CAPILLARY
Glucose-Capillary: 193 mg/dL — ABNORMAL HIGH (ref 65–99)
Glucose-Capillary: 191 mg/dL — ABNORMAL HIGH (ref 65–99)
Glucose-Capillary: 171 mg/dL — ABNORMAL HIGH (ref 65–99)
Glucose-Capillary: 181 mg/dL — ABNORMAL HIGH (ref 65–99)

## 2016-12-11 NOTE — Progress Notes (Signed)
Pt seen and examined. No issues overnight.  Expected level of post-op pain.  EXAM: Temp:  [97.6 F (36.4 C)-100 F (37.8 C)] 98.5 F (36.9 C) (02/14 0739) Pulse Rate:  [84-99] 92 (02/14 0739) Resp:  [12-19] 18 (02/14 0739) BP: (83-176)/(53-95) 83/53 (02/14 0739) SpO2:  [93 %-100 %] 93 % (02/14 0739) Intake/Output      02/13 0701 - 02/14 0700 02/14 0701 - 02/15 0700   P.O. 480    I.V. (mL/kg) 2925 (40.8)    IV Piggyback 300    Total Intake(mL/kg) 3705 (51.7)    Urine (mL/kg/hr) 2405 (1.4)    Blood 300 (0.2)    Total Output 2705     Net +1000           Awake and alert Follows commands throughout Full strength  Stable Continue current care Encouraged mobilization

## 2016-12-11 NOTE — Progress Notes (Signed)
PT Cancellation Note  Patient Details Name: Jennifer [name] MRN: [id number] DOB: 06/16/1956   Cancelled Treatment:    Reason Eval/Treat Not Completed: Medical issues which prohibited therapy. Pt currently with symptomatic hypotension and unable to tolerate PT treatment session at this time. Will continue with PT POC as pt tolerates.    Thelma Comp 12/11/2016, 8:56 AM   Rolinda Roan, PT, DPT Acute Rehabilitation Services Pager: [phone]

## 2016-12-11 NOTE — Evaluation (Signed)
Occupational Therapy Evaluation Patient Details Name: Jennifer [name] MRN: [id number] DOB: 1956-06-13 Today's Date: 12/11/2016    History of Present Illness Pt is 61 y.o. female s/p anterolateral lumbar interbody fusion L4-5, posterolateral/re-operative laminectomy L5-S1, and bilat extension of pedicle screw fixation to L4. PMH includes lumbar fusion L1-S1 in 2007.    Clinical Impression   Patient is s/p ALIF L4-5 , posterolateral/ reoperative laminectomy L5-s1 and BIL extension of pedicle screw fixation to L4 surgery resulting in functional limitations due to the deficits listed below (see OT problem list). Pt currently with decr BP limiting OT evaluations.  Patient will benefit from skilled OT acutely to increase independence and safety with ADLS to allow discharge HHOT and 3n1. Pt reports low toilets at home.      Follow Up Recommendations  Home health OT    Equipment Recommendations  3 in 1 bedside commode    Recommendations for Other Services       Precautions / Restrictions Precautions Precautions: Back Precaution Comments: handout provided and pt unable to recall any precautions. Required Braces or Orthoses: Spinal Brace Spinal Brace: Lumbar corset;Applied in sitting position Restrictions Weight Bearing Restrictions: No      Mobility Bed Mobility Overal bed mobility: Needs Assistance Bed Mobility: Rolling Rolling: Mod assist   Supine to sit: Mod assist Sit to supine: Max assist   General bed mobility comments: requires elevated bed surface requires cues to sequence and total (A) of bil LE on and off bed surface  Transfers Overall transfer level: Needs assistance Equipment used: Rolling walker (2 wheeled) Transfers: Sit to/from Stand Sit to Stand: From elevated surface         General transfer comment: cues for hand placement and sequence    Balance   Sitting-balance support: Bilateral upper extremity supported;Feet supported Sitting balance-Leahy  Scale: Poor     Standing balance support: Bilateral upper extremity supported;During functional activity Standing balance-Leahy Scale: Poor                              ADL Overall ADL's : Needs assistance/impaired     Grooming: Wash/dry face;Minimal assistance;Sitting Grooming Details (indicate cue type and reason): pt with poor static sitting balance.  Upper Body Bathing: Maximal assistance   Lower Body Bathing: Total assistance           Toilet Transfer: Minimal assistance Toilet Transfer Details (indicate cue type and reason): elevated surface of bed used to simulate due to symptoms. pt requires (A) to power up from elevated surface Toileting- Clothing Manipulation and Hygiene: Total assistance         General ADL Comments: Pt on arrival lethargic. pt able to complete knee flexion in bed but required tactile input from therapist. Pt diaphoretic on arrival and clothing bed linen changed. Pt reports incr comfort from clothing change. pt with low BP and return to supine due to symptoms of dizziness, decr response to questions, and reports "i need to sit"      Vision     Perception     Praxis      Pertinent Vitals/Pain Pain Assessment: 0-10 Pain Location: back Pain Descriptors / Indicators: Grimacing Pain Intervention(s): Limited activity within patient's tolerance;Repositioned (Rn notified of low BP)     Hand Dominance Right   Extremity/Trunk Assessment Upper Extremity Assessment Upper Extremity Assessment: Generalized weakness   Lower Extremity Assessment Lower Extremity Assessment: Defer to PT evaluation   Cervical / Trunk Assessment  Cervical / Trunk Assessment: Other exceptions (s/p surg)   Communication Communication Communication: No difficulties   Cognition Arousal/Alertness: Suspect due to medications;Lethargic Behavior During Therapy: Flat affect Overall Cognitive Status: Impaired/Different from baseline Area of Impairment:  Attention;Memory;Safety/judgement;Awareness   Current Attention Level: Focused Memory: Decreased short-term memory;Decreased recall of precautions Following Commands: Follows one step commands with increased time Safety/Judgement: Decreased awareness of deficits;Decreased awareness of safety     General Comments: pt with decr time to answer basic questions. pt with eyes closed and opening then closing. pt looking to spouse to answer therapist and then nods in response. pt flat affect with minimal answers. Pt does report pain in the back    General Comments       Exercises       Shoulder Instructions      Home Living Family/patient expects to be discharged to:: Private residence Living Arrangements: Spouse/significant other;Children;Other relatives Available Help at Discharge: Family Type of Home: House Home Access: Stairs to enter [employer] of Steps: 6 Entrance Stairs-Rails: None Home Layout: Two level;Able to live on main level with bedroom/bathroom     Bathroom Shower/Tub: Occupational psychologist: Standard     Home Equipment: Environmental consultant - 2 wheels;Shower seat;Grab bars - tub/shower (plans to put in hand held shower head)          Prior Functioning/Environment Level of Independence: Independent                 OT Problem List: Decreased strength;Decreased range of motion;Decreased activity tolerance;Impaired balance (sitting and/or standing);Decreased safety awareness;Decreased knowledge of use of DME or AE;Decreased cognition;Decreased knowledge of precautions;Pain   OT Treatment/Interventions: Self-care/ADL training;Therapeutic exercise;Energy conservation;DME and/or AE instruction;Therapeutic activities;Patient/family education;Balance training;Cognitive remediation/compensation    OT Goals(Current goals can be found in the care plan section) Acute Rehab OT Goals Patient Stated Goal: none stated at this time OT Goal Formulation: With  patient/family Time For Goal Achievement: 12/25/16 Potential to Achieve Goals: Good  OT Frequency: Min 2X/week   Barriers to D/C:            Co-evaluation              End of Session Equipment Utilized During Treatment: Gait belt;Back brace;Rolling walker Nurse Communication: Mobility status;Precautions  Activity Tolerance: Patient limited by fatigue Patient left: in bed;with call bell/phone within reach;with nursing/sitter in room   Time: 0806-0826 OT Time Calculation (min): 20 min Charges:  OT General Charges $OT Visit: 1 Procedure OT Evaluation $OT Eval High Complexity: 1 Procedure G-Codes:    Parke Poisson B 12/19/2016, 8:44 AM  Jeri Modena   OTR/L PagerIP:[phone] Office: [phone] .

## 2016-12-11 NOTE — Progress Notes (Signed)
Physical Therapy Treatment Patient Details Name: Jennifer [name] MRN: [id number] DOB: Jun 09, 1956 Today's Date: 12/11/2016    History of Present Illness Pt is 61 y.o. female s/p anterolateral lumbar interbody fusion L4-5, posterolateral/re-operative laminectomy L5-S1, and bilat extension of pedicle screw fixation to L4. PMH includes lumbar fusion L1-S1 in 2007.     PT Comments    Pt better able to participate in therapy session this afternoon. Pt with increased ambulation distance with RW, however appeared concerned with getting too far from room due to dizziness. BP remains soft, and nursing notified at end of session. Will continue to follow and progress as able per POC.   Follow Up Recommendations  Home health PT;Supervision for mobility/OOB     Equipment Recommendations  None recommended by PT    Recommendations for Other Services       Precautions / Restrictions Precautions Precautions: Back Precaution Booklet Issued: Yes (comment) Precaution Comments: Reviewed verbally during functional mobility.  Required Braces or Orthoses: Spinal Brace Spinal Brace: Lumbar corset;Applied in sitting position Restrictions Weight Bearing Restrictions: No    Mobility  Bed Mobility Overal bed mobility: Needs Assistance Bed Mobility: Rolling Rolling: Mod assist Sidelying to sit: Min assist Supine to sit: Mod assist Sit to supine: Max assist   General bed mobility comments: Pt was received sitting EOB with nursing staff  Transfers Overall transfer level: Needs assistance Equipment used: Rolling walker (2 wheeled) Transfers: Sit to/from Stand Sit to Stand: From elevated surface         General transfer comment: cues for hand placement on seated surface for safety.   Ambulation/Gait Ambulation/Gait assistance: Min guard Ambulation Distance (Feet): 70 Feet Assistive device: Rolling walker (2 wheeled) Gait Pattern/deviations: Step-to pattern;Decreased step length -  right;Decreased step length - left;Trunk flexed Gait velocity: Decreased  Gait velocity interpretation: Below normal speed for age/gender General Gait Details: Close guard for safety as pt ambulated with RW.    Stairs            Wheelchair Mobility    Modified Rankin (Stroke Patients Only)       Balance Overall balance assessment: Needs assistance Sitting-balance support: Bilateral upper extremity supported;Feet supported Sitting balance-Leahy Scale: Poor Sitting balance - Comments: Pt able to sit EOB using BUE for brace application.    Standing balance support: Bilateral upper extremity supported;During functional activity Standing balance-Leahy Scale: Poor Standing balance comment: Pt required RW for standing.                     Cognition Arousal/Alertness: Awake/alert Behavior During Therapy: Flat affect Overall Cognitive Status: Within Functional Limits for tasks assessed                      Exercises      General Comments        Pertinent Vitals/Pain Pain Assessment: 0-10 Pain Score: 9  Pain Location: back Pain Descriptors / Indicators: Grimacing;Operative site guarding Pain Intervention(s): Limited activity within patient's tolerance;Monitored during session;Repositioned    Home Living Family/patient expects to be discharged to:: Private residence Living Arrangements: Spouse/significant other;Children;Other relatives Available Help at Discharge: Family Type of Home: House Home Access: Stairs to enter Entrance Stairs-Rails: None Home Layout: Two level;Able to live on main level with bedroom/bathroom Home Equipment: Gilford Rile - 2 wheels;Shower seat;Grab bars - tub/shower (plans to put in hand held shower head)      Prior Function Level of Independence: Independent  PT Goals (current goals can now be found in the care plan section) Acute Rehab PT Goals Patient Stated Goal: none stated at this time PT Goal Formulation: With  patient Time For Goal Achievement: 12/10/16 Potential to Achieve Goals: Good Progress towards PT goals: Progressing toward goals    Frequency    Min 5X/week      PT Plan Current plan remains appropriate    Co-evaluation             End of Session Equipment Utilized During Treatment: Gait belt;Back brace Activity Tolerance: Patient limited by fatigue Patient left: in chair;with call bell/phone within reach     Time: 1400-1424 PT Time Calculation (min) (ACUTE ONLY): 24 min  Charges:  $Gait Training: 23-37 mins                    G Codes:      Thelma Comp Dec 28, 2016, 3:04 PM  Rolinda Roan, PT, DPT Acute Rehabilitation Services Pager: [phone]

## 2016-12-12 LAB — GLUCOSE, CAPILLARY: Glucose-Capillary: 110 mg/dL — ABNORMAL HIGH (ref 65–99)

## 2016-12-12 MED ORDER — CARISOPRODOL 350 MG PO TABS: 350.0000 mg | ORAL_TABLET | Freq: Four times a day (QID) | ORAL | 0 refills | Status: DC | PRN

## 2016-12-12 MED ORDER — OXYCODONE-ACETAMINOPHEN 10-325 MG PO TABS: 1.0000 | ORAL_TABLET | ORAL | 0 refills | Status: DC | PRN

## 2016-12-12 MED FILL — Heparin Sodium (Porcine) Inj 1000 Unit/ML: INTRAMUSCULAR | Qty: 30 | Status: AC

## 2016-12-12 MED FILL — Sodium Chloride IV Soln 0.9%: INTRAVENOUS | Qty: 1000 | Status: AC

## 2016-12-12 NOTE — Care Management Note (Signed)
Case Management Note  Patient Details  Name: Jennifer [name] MRN: [id number] Date of Birth: 1956-05-12  Subjective/Objective:   61 yr old female s/p  LUMBAR FOUR-FIVE Anterior lateral lumbar interbody fusion with LUMBAR FOUR-FIVE Posterolateral fusion/Re-operative laminectomy LUMBAR FIVE-SACRUM ONE Bilateral, Laminectomy at LUMBAR FOUR-FIVE , 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 and Left LUMBAR FOUR-FIVE with Mazor. Extension of pedicle screw fixation to LUMBAR FOUR (N/A)               Action/Plan: Case manager spoke with patient concerning Home Health needs. Referral was called to Stevie Kern, [place] Liaison. Patient will have family support at discharge.    Expected Discharge Date:  12/12/16               Expected Discharge Plan:  [place]  In-House Referral:  NA  Discharge planning Services  CM Consult  Post Acute Care Choice:  Home Health, Durable Medical Equipment Choice offered to:  Patient  DME Arranged:    DME Agency:     HH Arranged:  PT, OT HH Agency:  [facility]  Status of Service:  Completed, signed off  If discussed at [place] of Stay Meetings, dates discussed:    Additional Comments:  Ninfa Meeker, RN 12/12/2016, 12:01 PM

## 2016-12-12 NOTE — Progress Notes (Signed)
Pt doing well. Pt given D/C instructions with Rx's, verbal understanding was provided. Pt's incisions are clean and dry with no sign of infection. Pt's IV was removed prior to D/C. Pt D/C'd home via wheelchair per MD order. Home Health was arranged by CM per MD order. Pt is stable @ D/C and has no other needs at this time. Holli Humbles, RN

## 2016-12-12 NOTE — Progress Notes (Signed)
Pt was seen and examined. No issues overnight. Endorses stiffness in back, but denies radicular symptoms Denies any motor/sensory deficits. Pain well controlled.  EXAM:  BP (!) 92/55 (BP Location: Right Arm)   Pulse 80   Temp 99.1 F (37.3 C) (Oral)   Resp 18   Ht 5\' 3"  (1.6 m)   Wt 71.7 kg (158 lb)   SpO2 98%   BMI 27.99 kg/m   Awake, alert, oriented  Speech fluent, appropriate  CN grossly intact  5/5 BUE/BLE  Incision without signs of infection.  PLAN: - Stable -Continue current care -Encouraged ambulation

## 2016-12-12 NOTE — Discharge Summary (Signed)
Date of Admission: 12/10/2016  Date of Discharge: 12/12/16  PRE-OPERATIVE DIAGNOSIS:  Lumbar Radiculopathy, synovial cysts  POST-OPERATIVE DIAGNOSIS:  Lumbar Radiculopathy, synovial cysts  PROCEDURE:  Procedure(s): LUMBAR FOUR-FIVE  Anterior lateral lumbar interbody fusion with LUMBAR FOUR-FIVE  Posterolateral fusion/Re-operative laminectomy LUMBAR FIVE-SACRUM ONE Bilateral, Laminectomy at LUMBAR FOUR-FIVE , 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  and Left LUMBAR FOUR-FIVE  with Mazor (N/A) Extension of pedicle screw fixation to LUMBAR FOUR (N/A) APPLICATION OF ROBOTIC ASSISTANCE FOR SPINAL PROCEDURE (N/A)  Attending: Kevan Ny Ditty, MD  Hospital Course:  The patient was admitted for the above listed operation and had an uncomplicated post-operative course.  They were discharged in stable condition.  Follow up: 3 weeks  Allergies as of 12/12/2016      Reactions   No Known Allergies       Medication List    STOP taking these medications   HYDROcodone-acetaminophen 10-325 MG tablet Commonly known as:  [place] these medications   albuterol-ipratropium 18-103 MCG/ACT inhaler Commonly known as:  COMBIVENT Inhale 2 puffs into the lungs daily as needed for wheezing or shortness of breath.   ALPRAZolam 0.5 MG tablet Commonly known as:  XANAX Take 1 tablet (0.5 mg total) by mouth at bedtime as needed for anxiety. What changed:  reasons to take this   amLODipine 5 MG tablet Commonly known as:  NORVASC Take 1 tablet (5 mg total) by mouth 2 (two) times daily. What changed:  when to take this   atorvastatin 80 MG tablet Commonly known as:  LIPITOR Take 80 mg by mouth daily after breakfast.   carisoprodol 350 MG tablet Commonly known as:  SOMA Take 1 tablet (350 mg total) by mouth every 6 (six) hours as needed for muscle spasms. What changed:  how much to take  how to take this  when to take this  reasons to take this  additional  instructions   carvedilol 12.5 MG tablet Commonly known as:  COREG Take 1 tablet (12.5 mg total) by mouth 2 (two) times daily with a meal.   E-Z SPACER inhaler Reported on 01/15/2016   fluticasone 110 MCG/ACT inhaler Commonly known as:  FLOVENT HFA Inhale 2 puffs into the lungs 2 (two) times daily.   fluticasone 50 MCG/ACT nasal spray Commonly known as:  FLONASE Place 1 spray into both nostrils 2 (two) times daily as needed for allergies.   FREESTYLE FREEDOM LITE w/Device Kit Use 3 (three) times daily. 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 Dx Code: 250.00   HUMULIN 70/30 KWIKPEN (70-30) 100 UNIT/ML PEN Generic drug:  Insulin Isophane & Regular Human Inject 20 Units into the skin 2 (two) times daily.   lisinopril 20 MG tablet Commonly known as:  PRINIVIL,ZESTRIL TAKE 1 TABLET DAILY What changed:  See the new instructions.   lithium carbonate 450 MG CR tablet Commonly known as:  ESKALITH Take 1 tablet (450 mg total) by mouth at bedtime. What changed:  when to take this   metFORMIN 750 MG 24 hr tablet Commonly known as:  GLUCOPHAGE-XR Take 1,500 mg by mouth daily with supper.   nitroGLYCERIN 0.4 MG SL tablet Commonly known as:  NITROSTAT Place 1 tablet (0.4 mg total) under the tongue every 5 (five) minutes as needed for chest pain.   omeprazole 20 MG capsule Commonly known as:  PRILOSEC Take 20 mg by mouth daily.   oxyCODONE-acetaminophen 10-325 MG tablet Commonly known as:  PERCOCET Take 1-2 tablets  by mouth every 4 (four) hours as needed for pain.   pregabalin 50 MG capsule Commonly known as:  LYRICA Limit 1 tablet by mouth per day or twice per day if tolerated What changed:  how much to take  how to take this  when to take this  additional instructions   PROAIR HFA 108 (90 Base) MCG/ACT inhaler Generic drug:  albuterol Inhale 2 puffs into the lungs every 6 (six) hours as needed for wheezing or shortness of breath.   QUEtiapine 300 MG  tablet Commonly known as:  SEROQUEL Take 1.5 tablet at bed time. What changed:  how much to take  how to take this  when to take this  additional instructions   RESTASIS 0.05 % ophthalmic emulsion Generic drug:  cycloSPORINE Place 1 drop into both eyes daily as needed (for dryness).   rOPINIRole 0.5 MG tablet Commonly known as:  REQUIP Take 1 mg by mouth at bedtime as needed (for restless legs).   sitaGLIPtin 100 MG tablet Commonly known as:  JANUVIA Take 100 mg by mouth daily.   Vilazodone HCl 40 MG Tabs Commonly known as:  VIIBRYD Take 1 tablet (40 mg total) by mouth daily. What changed:  when to take this   zolpidem 5 MG tablet Commonly known as:  AMBIEN Take 1 tablet (5 mg total) by mouth at bedtime.

## 2016-12-12 NOTE — Progress Notes (Signed)
Occupational Therapy Treatment Patient Details Name: Jennifer [name] MRN: [id number] DOB: [birth year] Today's Date: 12/12/2016    History of present illness Pt is 61 y.o. female s/p anterolateral lumbar interbody fusion L4-5, posterolateral/re-operative laminectomy L5-S1, and bilat extension of pedicle screw fixation to L4. PMH includes lumbar fusion L1-S1 in 2007.    OT comments  Pt making progress with functional goals, requires min cues to maintain back precautions. Pt's spouse able to donn/doff pt's lumbar brace correctly. Pt scheduled to d/c home today and will have assist from spouse at home  Follow Up Recommendations  Home health OT    Equipment Recommendations  3 in 1 bedside commode;Other (comment) (ADL A/E kit)    Recommendations for Other Services      Precautions / Restrictions Precautions Precautions: Back Precaution Comments: pt able to recall 2/3 back precautions, reviewed back precautions with pt and her husband Required Braces or Orthoses: Spinal Brace Spinal Brace: Lumbar corset;Applied in sitting position (pt and spouse able to donn and doff brace properly) Restrictions Weight Bearing Restrictions: No       Mobility Bed Mobility   Bed Mobility: Sit to Sidelying Rolling: Min assist       Sit to sidelying: Min assist General bed mobility comments: pt sitting EOB upon entering room, cues for log roll technique and back precautions  Transfers Overall transfer level: Needs assistance Equipment used: Rolling walker (2 wheeled) Transfers: Sit to/from Stand Sit to Stand: From elevated surface;Supervision              Balance Overall balance assessment: Needs assistance Sitting-balance support: Bilateral upper extremity supported;Feet supported Sitting balance-Leahy Scale: Fair     Standing balance support: Bilateral upper extremity supported;During functional activity Standing balance-Leahy Scale: Fair                     ADL Overall  ADL's : Needs assistance/impaired     Grooming: Sitting;Set up   Upper Body Bathing: Minimal assistance   Lower Body Bathing: With caregiver independent assisting   Upper Body Dressing : Minimal assistance   Lower Body Dressing: With caregiver independent assisting                 General ADL Comments: pt required cues for back precautions during ADLs and ADL mobility. Educated spouse and pt on ADL A/E for LB ADLs                                      Cognition   Behavior During Therapy: WFL for tasks assessed/performed Overall Cognitive Status: Within Functional Limits for tasks assessed                                                  General Comments  pt pleasant and cooperative    Pertinent Vitals/ Pain       Pain Assessment: 0-10 Pain Score: 6  Pain Location: back Pain Descriptors / Indicators: Aching;Sore;Guarding;Grimacing Pain Intervention(s): Monitored during session;Premedicated before session;Repositioned  Frequency  Min 2X/week        Progress Toward Goals  OT Goals(current goals can now be found in the care plan section)  Progress towards OT goals: Progressing toward goals  Acute Rehab OT Goals Patient Stated Goal: go home OT Goal Formulation: With patient/family           End of Session Equipment Utilized During Treatment: Back brace;Rolling walker   Activity Tolerance Patient limited by pain   Patient Left in bed;with call bell/phone within reach;with family/visitor present             Time: 4469-5072 OT Time Calculation (min): 23 min  Charges: OT General Charges $OT Visit: 1 Procedure OT Treatments $Self Care/Home Management : 8-22 mins $Therapeutic Activity: 8-22 mins  Jennifer [name] 12/12/2016, 1:49 PM

## 2016-12-12 NOTE — Progress Notes (Signed)
Physical Therapy Treatment Patient Details Name: Jennifer [name] MRN: [id number] DOB: 1955/12/16 Today's Date: 12/12/2016    History of Present Illness Pt is 61 y.o. female s/p anterolateral lumbar interbody fusion L4-5, posterolateral/re-operative laminectomy L5-S1, and bilat extension of pedicle screw fixation to L4. PMH includes lumbar fusion L1-S1 in 2007.     PT Comments    Pt continuing to progress towards goals. Pt performed stair training with min A this session. Pt increasing independence with gait and performed gait training with min guard assist and periods of supervision. Pt not reporting dizziness this session with mobility tasks. Continue to recommend d/c home with follow up recommendations below. Will continue to follow.   Follow Up Recommendations  Home health PT;Supervision for mobility/OOB     Equipment Recommendations  None recommended by PT    Recommendations for Other Services       Precautions / Restrictions Precautions Precautions: Back Precaution Booklet Issued: Yes (comment) Precaution Comments: Previously administered. Able to recall 2/3, required verbal cues to remember lifting precautions.   Required Braces or Orthoses: Spinal Brace Spinal Brace: Lumbar corset;Applied in sitting position Restrictions Weight Bearing Restrictions: No    Mobility  Bed Mobility Overal bed mobility: Needs Assistance Bed Mobility: Sidelying to Sit Rolling: Supervision Sidelying to sit: Supervision     Sit to sidelying: Min assist General bed mobility comments: Verbal cues for appropriate log roll technique.   Transfers Overall transfer level: Needs assistance Equipment used: Rolling walker (2 wheeled) Transfers: Sit to/from Stand Sit to Stand: Supervision         General transfer comment: Supervision for safety  Ambulation/Gait Ambulation/Gait assistance: Supervision;Min guard Ambulation Distance (Feet): 150 Feet Assistive device: Rolling walker (2  wheeled) Gait Pattern/deviations: Decreased stride length;Step-through pattern;Trunk flexed Gait velocity: decreased Gait velocity interpretation: Below normal speed for age/gender General Gait Details: Min guard with occaisional bouts of supervision for safety. Required verbal cues for appropriate upright posture during gait.    Stairs Stairs: Yes   Stair Management: One rail Left;Step to pattern;Sideways Number of Stairs: 5 General stair comments: Min A for safety and balance. Required continuous verbal cues for appropriate technique. Initially pt stated she could not perform stair training, however, with encouragement was agreeable to participate.    Wheelchair Mobility    Modified Rankin (Stroke Patients Only)       Balance Overall balance assessment: Needs assistance Sitting-balance support: Bilateral upper extremity supported Sitting balance-Leahy Scale: Fair     Standing balance support: Bilateral upper extremity supported;During functional activity Standing balance-Leahy Scale: Fair                      Cognition Arousal/Alertness: Awake/alert Behavior During Therapy: WFL for tasks assessed/performed Overall Cognitive Status: Within Functional Limits for tasks assessed                      Exercises      General Comments        Pertinent Vitals/Pain Pain Assessment: Faces Pain Score: 6  Faces Pain Scale: Hurts little more Pain Location: back Pain Descriptors / Indicators: Aching;Operative site guarding;Sore Pain Intervention(s): Monitored during session;Repositioned;Limited activity within patient's tolerance    Home Living                      Prior Function            PT Goals (current goals can now be found in the care plan section) Acute  Rehab PT Goals Patient Stated Goal: go home PT Goal Formulation: With patient Time For Goal Achievement: 12/17/16 Potential to Achieve Goals: Good Progress towards PT goals:  Progressing toward goals    Frequency    Min 5X/week      PT Plan Current plan remains appropriate    Co-evaluation             End of Session Equipment Utilized During Treatment: Gait belt;Back brace Activity Tolerance: Patient tolerated treatment well Patient left: in chair;with family/visitor present;with call bell/phone within reach     Time: [id number] PT Time Calculation (min) (ACUTE ONLY): 15 min  Charges:  $Gait Training: 8-22 mins                    G Codes:      Mamie Levers 12/12/2016, 2:21 PM   Nicky Pugh, PT, DPT  Acute Rehabilitation Services  Pager: [phone]

## 2016-12-17 ENCOUNTER — Encounter (HOSPITAL_COMMUNITY): Payer: Self-pay | Admitting: Neurological Surgery

## 2016-12-24 NOTE — Op Note (Signed)
12/10/2016  9:49 AM  PATIENT:  Michae Kava Nishi  61 y.o. female  PRE-OPERATIVE DIAGNOSIS:  Lumbosacral spondylosis with radiculopathy, lumbar stenosis L4-5 and L5-S1, left L4-5 and right L5-S1 synovial cysts, prior fixation and fusion L5-S1  POST-OPERATIVE DIAGNOSIS:  Same  PROCEDURE:  L4-5 transpsoas lateral interbody fusion, extension of fixation to L4 utilizing bilateral pedicle screws, exploration of fusion L5-S1, posterolateral arthrodesis L4-5, re-operative laminectomy right and left L5-S1, laminectomy L4-5, use of BMP  SURGEON:  Aldean Ast, MD  ASSISTANTS: Erline Levine, MD  ANESTHESIA:   General  DRAINS: None   SPECIMEN:  Synovial cyst left L4-5 and right L5-S1  INDICATION FOR PROCEDURE: 61 year old woman who has previously undergone L5-S1 posterior interbody fusion with pedicle screw fixation presents with radiculopathy and synovial cysts at L4-5 and L5-S1 as well as adjacent segment disc degeneration. I recommended the above operation. Patient understood the risks, benefits, and alternatives and potential outcomes and wished to proceed.  PROCEDURE DETAILS: The patient was brought to the operating room.  After smooth induction of general endotracheal anesthesia the patient was placed in the lateral position with the right side up and secured with tape.  Localizing views were taken with fluoroscopy.  The operative site was then prepped and draped in the usual sterile fashion.  The planned incisions were infiltrated with lidocaine with epinephrine.   The skin was sharply incised and the oblique abdominal muscles were bluntly dissected.  I encountered an easily dissected fat plane, consistent with the retroperitoneum.  I inserted the dilator and approached the middle third of the L4-5 disc space.  I confirmed that I was not in contact with any nerves of the lumbar plexus.  The tract was sequentially dilated and then the retractor was introduced.  I incised the disc space and  then passed a Cobb elevator along each endplate and penetrated the annulus on the contralateral side.  I then used box cutters, a paddle, and a rasp to complete the discectomy.  A trial spacer was used to determine appropriate graft size and then a PEEK lordotic interbody graft packed with BMP and beta tricalcium phosphate was inserted.  The retractor was removed after hemostasis was confirmed.   The incision was closed in layers with interrupted vicryl sutures.  The skin was closed with running monocryl stratafix sutures and dermabond.  The patient was then turned prone on the OR table.    The patient's low back was prepped and draped in usual sterile fashion.  The existing incision was reopened by excising the scar.  Dissection of the soft tissues was performed in the midline using monopolar cautery.  I encounter the L4 spinous process and perform subperiosteal dissection on both sides.  A perform dissection laterally at L5 and S1.  I then countered pedicle screws bilaterally at L5 and S1.  These were very lateralized.  They were dissected free  Of the soft tissue.  The set screws were removed and then the rods were removed.  Dissection revealed solid bony fusion in the posterior lateral  Aspect of L5-S1.  There was no abnormal motion at this level.  The Mazor   Surgical robot was then registered after placing a Schanz pin in the PSIS.  The robot was used to place  K-wires into the L4 pedicles bilaterally.  This was performed by positioning the drill guide which I malleted into place and then drilled.  A reduction tube was used to enter the pedicle and then the K-wire was  placed.  I then placed pedicle screws over these K-wires.    I then removed the screws at L5 bilaterally.  I was then able to engage a screwdriver and so  I used a small rod locked down with a set screw and was able to rotate both L5 screws out.    I palpated the screw channels and found them to be competent.I replaced the L5 screws  with larger diameter screws.      I used a straight curette to free the thecal sac and scar from the cut edge of L5-S1 bilaterally.  I was able to satisfactorily free the soft tissues without nerve root injury or damage to the thecal sac.  I used a high-speed drill to thin the bone and resected further with a Kerrison rongeur bilaterally to provide additional decompression of the nerve roots.  I encounter a synovial cyst on the right at L5-S1.  I performed a generous facetectomy and resected the cyst and much of the facet with Kerrison rongeurs.  I used the high-speed drill at L4-5 to thin the existing lamina which was then resected with Kerrison rongeur is.  I encounter a small synovial cyst on the left at L4-5.  I again performed a generous facetectomy and resected the cyst with a Kerrison rongeur.    I irrigated vigorously with bacitracin saline.  I decorticated the transverse processes of L4 and L5 bilaterally.  I placed locally harvested autograft mixed with beta tricalcium phosphate in the lateral gutters.  I inserted a titanium rod into each set of  Pedicle screws and secured them with set screws which were then tightened using a torque device.  I inspected and determined there was excellent hemostasis.  I placed vancomycin powder in the depths of the wound.  The wound was closed in routine  In anatomic layers using interrupted Vicryl sutures.  The skin was closed with a running Monocryl suture and Dermabond.  A sterile  Dressing was applied and the patient was returned to the supine position on the bed.   PATIENT DISPOSITION:  PACU - hemodynamically stable.   Delay start of Pharmacological VTE agent (>24hrs) due to surgical blood loss or risk of bleeding:  yes

## 2017-01-06 ENCOUNTER — Encounter (HOSPITAL_COMMUNITY): Payer: Self-pay | Admitting: Neurological Surgery

## 2017-01-08 ENCOUNTER — Other Ambulatory Visit: Payer: Self-pay

## 2017-01-08 NOTE — Telephone Encounter (Signed)
pt called left message pt states that she has had several back surgeries and that she is unable to drive.  pt asked if you can give her some refills on her medications until she recovers from surgery.

## 2017-01-09 NOTE — Telephone Encounter (Signed)
Which medication ?

## 2017-01-09 NOTE — Telephone Encounter (Signed)
pt called asked if another doctor would refill her medication pt states she is out . pt was told that dr. Gretel Acre would not be back until monday and she stated that she could not wait until monday.

## 2017-01-22 MED ORDER — QUETIAPINE FUMARATE 300 MG PO TABS: 300.0000 mg | ORAL_TABLET | Freq: Every day | ORAL | 1 refills | Status: DC

## 2017-01-22 MED ORDER — LITHIUM CARBONATE ER 450 MG PO TBCR: 450.0000 mg | EXTENDED_RELEASE_TABLET | Freq: Every day | ORAL | 1 refills | Status: DC

## 2017-01-22 MED ORDER — ALPRAZOLAM 0.25 MG PO TABS: 0.5000 mg | ORAL_TABLET | Freq: Every evening | ORAL | 2 refills | Status: DC | PRN

## 2017-01-22 MED ORDER — ZOLPIDEM TARTRATE 5 MG PO TABS: 5.0000 mg | ORAL_TABLET | Freq: Every day | ORAL | 0 refills | Status: DC

## 2017-01-22 MED ORDER — VILAZODONE HCL 40 MG PO TABS: 40.0000 mg | ORAL_TABLET | Freq: Every day | ORAL | 1 refills | Status: DC

## 2017-01-22 NOTE — Telephone Encounter (Signed)
Pt states again she is out of medications.

## 2017-01-22 NOTE — Telephone Encounter (Signed)
Faxed and confirmed rx for ambien 5mg  id # [id number] order # [id number]  and xanax.25mg  ird # [id number] order # [id number]

## 2017-01-22 NOTE — Telephone Encounter (Signed)
pt called again she needs refills on all her medications. pt states she had back surgery and not able to drive.

## 2017-01-22 NOTE — Telephone Encounter (Signed)
meds refilled 

## 2017-01-29 ENCOUNTER — Telehealth: Payer: Self-pay

## 2017-01-29 NOTE — Telephone Encounter (Signed)
dr. Gretel Acre signed rx and put on my desk.

## 2017-01-29 NOTE — Telephone Encounter (Signed)
pt called states that the xanax was not the right doseage.

## 2017-02-19 ENCOUNTER — Encounter: Payer: Self-pay | Admitting: Psychiatry

## 2017-02-19 ENCOUNTER — Ambulatory Visit (INDEPENDENT_AMBULATORY_CARE_PROVIDER_SITE_OTHER): Admitting: Psychiatry

## 2017-02-19 VITALS — BP 205/134 | HR 88 | Temp 98.7°F | Wt 157.6 lb

## 2017-02-19 DIAGNOSIS — F3130 Bipolar disorder, current episode depressed, mild or moderate severity, unspecified: Secondary | ICD-10-CM | POA: Diagnosis not present

## 2017-02-19 DIAGNOSIS — F411 Generalized anxiety disorder: Secondary | ICD-10-CM

## 2017-02-19 MED ORDER — ALPRAZOLAM 0.25 MG PO TABS: 0.2500 mg | ORAL_TABLET | Freq: Every evening | ORAL | 2 refills | Status: DC | PRN

## 2017-02-19 MED ORDER — ZOLPIDEM TARTRATE 5 MG PO TABS: 5.0000 mg | ORAL_TABLET | Freq: Every day | ORAL | 1 refills | Status: DC
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
## 2017-02-21 ENCOUNTER — Telehealth: Payer: Self-pay | Admitting: Cardiovascular Disease

## 2017-02-21 NOTE — Telephone Encounter (Signed)
3 attempts to schedule fu from recall list.  Deleting recall 6 month fu per ckout 05/15/16 [place]    Deleting recall.

## 2017-03-15 ENCOUNTER — Other Ambulatory Visit: Payer: Self-pay | Admitting: Psychiatry

## 2017-03-19 ENCOUNTER — Ambulatory Visit: Admitting: Psychiatry

## 2017-03-29 NOTE — Addendum Note (Signed)
Addendum  created 03/29/17 [id number] by Duane Boston, MD   Sign clinical note

## 2017-04-29 ENCOUNTER — Other Ambulatory Visit: Payer: Self-pay | Admitting: Psychiatry

## 2017-05-02 NOTE — Telephone Encounter (Signed)
pt called states she needs refill on her ambien, viibryd and on seroquel.  pt has appt for 06-09-17 last seen on  02-19-17. pt had back surgery .

## 2017-05-05 NOTE — Telephone Encounter (Signed)
received a fax requesting a refill on ambien. pt last seen on  02-19-17 next appt  06-09-17.     Disp Refills Start End   zolpidem (AMBIEN) 5 MG tablet 30 tablet 1 02/19/2017    Sig - Route: Take 1 tablet (5 mg total) by mouth at bedtime. - Oral   Class: Print

## 2017-05-05 NOTE — Telephone Encounter (Signed)
Call in 1 month supply after verifying dosages.

## 2017-05-06 NOTE — Telephone Encounter (Signed)
I reviewed the chart and patient is on multiple medications that can cause sedation. I would like for Dr. Gretel Acre to address

## 2017-05-06 NOTE — Telephone Encounter (Signed)
NA

## 2017-05-12 NOTE — Telephone Encounter (Signed)
received a fax requesting a refill on ambien.  pt was last seen on  02-19-17 next appt  06-09-17

## 2017-05-12 NOTE — Telephone Encounter (Signed)
Not seen since [place]. She need to make appt.

## 2017-05-31 ENCOUNTER — Other Ambulatory Visit: Payer: Self-pay | Admitting: Cardiovascular Disease

## 2017-06-09 ENCOUNTER — Ambulatory Visit (INDEPENDENT_AMBULATORY_CARE_PROVIDER_SITE_OTHER): Admitting: Psychiatry

## 2017-06-09 ENCOUNTER — Encounter: Payer: Self-pay | Admitting: Psychiatry

## 2017-06-09 VITALS — BP 160/70 | HR 90 | Temp 99.2°F | Wt 161.4 lb

## 2017-06-09 DIAGNOSIS — F411 Generalized anxiety disorder: Secondary | ICD-10-CM | POA: Diagnosis not present

## 2017-06-09 DIAGNOSIS — F3130 Bipolar disorder, current episode depressed, mild or moderate severity, unspecified: Secondary | ICD-10-CM

## 2017-06-09 MED ORDER — QUETIAPINE FUMARATE 300 MG PO TABS: 300.0000 mg | ORAL_TABLET | Freq: Every day | ORAL | 1 refills | Status: DC

## 2017-06-09 MED ORDER — LITHIUM CARBONATE ER 450 MG PO TBCR: 450.0000 mg | EXTENDED_RELEASE_TABLET | Freq: Every day | ORAL | 1 refills | Status: DC

## 2017-06-09 MED ORDER — ZOLPIDEM TARTRATE 5 MG PO TABS: 5.0000 mg | ORAL_TABLET | Freq: Every day | ORAL | 2 refills | Status: DC

## 2017-06-09 NOTE — Progress Notes (Signed)
BH MD/PA/NP OP Progress Note  06/09/2017 1:19 PM Jennifer [name]  MRN:  [id number]  Subjective:    Patient is a 61 year-old married female who presented for the follow-up appointment. She was depressed and tearful during the interview. She reported that she is having a difficult time at the CPS and her grandchildren. She reported that her son and his wife are in the rehabilitation program in [place]. She reported that they are trying hard to get the children back. She stated that they are also involved in the case for at least 4 months. She stated that the caseworker is blaming them for using drugs as well although she has never used drugs. She was upset about the same. She stated that she was not able to sleep well so she increased the dose of Seroquel. However she sleeps well with the help of combination medication including Ambien and Seroquel. We discussed about her medications in detail. She is compliant with them. She denied having any suicidal homicidal ideations or plans.       Chief Complaint:   Visit Diagnosis:     ICD-10-CM   1. Bipolar I disorder, most recent episode depressed ([place]) F31.30   2. Generalized anxiety disorder F41.1     Past Medical History:  Past Medical History:  Diagnosis Date  . Anemia 1985   bled during a cervical biopsy, led to bleed transfusion   . Anxiety   . Arthritis    pt. reports that her neck is stiff   . Bipolar depression ([place])   . Blood dyscrasia    reported hx [name] '85; saw hematologist Dr. Janese Banks 10/2016, VWD work-up WNL, not felt to have a primary bleeding disorder   . CAD (coronary artery disease)   . 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 COPD (chronic obstructive pulmonary disease) ([place])    dr. Gar [facility]    PCP  . Depression   . Depression with anxiety   . Diabetes mellitus   . Dyspnea    W/ EXERTION   . Enlarged liver   . Family history of adverse reaction to anesthesia    father woke up slowly  . Fibromyalgia   . Generalized anxiety disorder   . GERD (gastroesophageal reflux disease)   . Hyperlipidemia   . Hypertension   . Low back pain   . Migraines   . Neuropathy   . Persistent disorder of initiating or maintaining sleep   . Von Willebrand disease ([place])     Past Surgical History:  Procedure Laterality Date  . ABDOMINAL HYSTERECTOMY    . ANTERIOR LAT LUMBAR FUSION N/A 12/10/2016   Procedure: LUMBAR FOUR-FIVE  Anterior lateral lumbar interbody fusion with LUMBAR FOUR-FIVE  Posterolateral fusion/Re-operative laminectomy LUMBAR FIVE-SACRUM ONE Bilateral, Laminectomy at LUMBAR FOUR-FIVE , 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  and Left LUMBAR FOUR-FIVE  with Mazor;  Surgeon: Kevan Ny Ditty, MD;  Location: [facility];  Service: Neurosurgery;  Laterality: N/A;  . APPLICATION OF ROBOTIC ASSISTANCE FOR SPINAL PROCEDURE N/A 12/10/2016   Procedure: APPLICATION OF ROBOTIC ASSISTANCE FOR SPINAL PROCEDURE;  Surgeon: Kevan Ny Ditty, MD;  Location: [facility];  Service: Neurosurgery;  Laterality: N/A;  . BACK SURGERY  2007   lumbar  . BREAST IMPLANT REMOVAL    . CARDIAC CATHETERIZATION     CAD,PCI of LAD, Cypher stent 2.5-28mm  . CARDIAC CATHETERIZATION  PCI of mid-LAD in stent restenosis with a drug-eluting stent  . CARDIAC CATHETERIZATION  06/03/13   [facility];MID LAD 100 % STENOSIS; PRIOR STENT; MID RCA 40 % STENOSIS  . CORONARY ARTERY BYPASS GRAFT   08-12-2011   CABG x 3  . CORONARY STENT PLACEMENT    . LUMBAR FUSION     L1-S1  . LUMBAR PERCUTANEOUS PEDICLE SCREW 1 LEVEL N/A 12/10/2016   Procedure: Extension of pedicle screw fixation to LUMBAR FOUR;  Surgeon: Kevan Ny Ditty, MD;  Location: [facility];  Service: Neurosurgery;  Laterality: N/A;  . NASAL SINUS SURGERY  1997   tuirbinate reduction    Family History:  Family History  Problem Relation Age of Onset  . Aneurysm Father        abdominal  . Diabetes Father   . Alcohol abuse  Father   . Hypertension Father   . Aneurysm Brother        abdominal  . Alcohol abuse Brother   . Hypertension Brother   . Diabetes Mother   . Alcohol abuse Mother   . Hypertension Other        family hx  . Hyperlipidemia Other        family hx  . Diabetes Other        family hx   Social History:  Social History   Social History  . Marital status: Married    Spouse name: N/A  . Number of children: N/A  . Years of education: N/A   Social History Main Topics  . Smoking status: Former Smoker    Years: 16.00    Types: Cigarettes    Quit date: 06/20/2010  . Smokeless tobacco: Never Used  . Alcohol use No  . Drug use: No  . Sexual activity: Yes   Other Topics Concern  . Not on file   Social History Narrative  . No narrative on file   Additional History:  Lives with her family members and has good relationship with them.  Assessment:   Musculoskeletal: Strength & Muscle Tone: within normal limits Gait & Station: normal Patient leans: N/A  Psychiatric Specialty Exam: Medication Refill  Associated symptoms include myalgias. Pertinent negatives include no nausea.  Depression         Associated symptoms include insomnia and myalgias.  Associated symptoms include no suicidal ideas.   Review of Systems  Constitutional: Positive for malaise/fatigue.  HENT: Negative.  Negative for nosebleeds and tinnitus.   Eyes: Negative.  Negative for photophobia.  Respiratory: Negative.   Cardiovascular: Negative.  Negative for palpitations.  Gastrointestinal: Negative for diarrhea and nausea.  Genitourinary: Negative for frequency.  Musculoskeletal: Positive for back pain and myalgias.  Skin: Negative.   Neurological: Negative for tremors.  Endo/Heme/Allergies: Negative for environmental allergies.  Psychiatric/Behavioral: Positive for depression. Negative for substance abuse and suicidal ideas. The patient is nervous/anxious and has insomnia.   All other systems reviewed and  are negative.   There were no vitals taken for this visit.There is no height or weight on file to calculate BMI.  General Appearance: Casual  Eye Contact:  Fair  Speech:  Slow  Volume:  Decreased  Mood:  Anxious and Depressed  Affect:  Congruent  Thought Process:  Logical  Orientation:  Full (Time, Place, and Person)  Thought Content:  WDL  Suicidal Thoughts:  No  Homicidal Thoughts:  No  Memory:  Immediate;   Fair  Judgement:  Fair  Insight:  Fair  Psychomotor Activity:  Normal  Concentration:  Fair  Recall:  [employer] of Knowledge: Fair  Language: Fair  Akathisia:  No  Handed:  Right  AIMS (if indicated):    Assets:  Communication Skills Desire for Improvement Housing Social Support  ADL's:  Intact  Cognition: WNL  Sleep:  good   Is the patient at risk to self?  No. Has the patient been a risk to self in the past 6 months?  No. Has the patient been a risk to self within the distant past?  Yes.   Is the patient a risk to others?  No. Has the patient been a risk to others in the past 6 months?  No. Has the patient been a risk to others within the distant past?  No.  Current Medications: Current Outpatient Prescriptions  Medication Sig Dispense Refill  . albuterol (PROAIR HFA) 108 (90 Base) MCG/ACT inhaler Inhale 2 puffs into the lungs every 6 (six) hours as needed for wheezing or shortness of breath.     Marland Kitchen albuterol-ipratropium (COMBIVENT) 18-103 MCG/ACT inhaler Inhale 2 puffs into the lungs daily as needed for wheezing or shortness of breath.     . ALPRAZolam (XANAX) 0.25 MG tablet Take 1 tablet (0.25 mg total) by mouth at bedtime as needed for anxiety. 8 tablet 2  . amLODipine (NORVASC) 5 MG tablet Take 1 tablet (5 mg total) by mouth 2 (two) times daily. (Patient taking differently: Take 5 mg by mouth daily at 2 PM. ) 180 tablet 4  . atorvastatin (LIPITOR) 80 MG tablet Take 80 mg by mouth daily after breakfast.     . Blood Glucose Monitoring Suppl (FREESTYLE FREEDOM  LITE) W/DEVICE KIT Use 3 (three) times daily. 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 Dx Code: 250.00    . carisoprodol (SOMA) 350 MG tablet Take 1 tablet (350 mg total) by mouth every 6 (six) hours as needed for muscle spasms. 120 tablet 0  . carvedilol (COREG) 12.5 MG tablet Take 1 tablet (12.5 mg total) by mouth 2 (two) times daily with a meal. 180 tablet 3  . fluticasone (FLONASE) 50 MCG/ACT nasal spray Place 1 spray into both nostrils 2 (two) times daily as needed for allergies.    . fluticasone (FLOVENT HFA) 110 MCG/ACT inhaler Inhale 2 puffs into the lungs 2 (two) times daily.    Marland Kitchen HUMULIN 70/30 KWIKPEN (70-30) 100 UNIT/ML PEN Inject 20 Units into the skin 2 (two) times daily.     Marland Kitchen lisinopril (PRINIVIL,ZESTRIL) 20 MG tablet TAKE 1 TABLET DAILY 90 tablet 3  . lithium carbonate (ESKALITH) 450 MG CR tablet Take 1 tablet (450 mg total) by mouth daily at 2 PM. 90 tablet 1  . metFORMIN (GLUCOPHAGE-XR) 750 MG 24 hr tablet Take 1,500 mg by mouth daily with supper.    . nitroGLYCERIN (NITROSTAT) 0.4 MG SL tablet Place 1 tablet (0.4 mg total) under the tongue every 5 (five) minutes as needed for chest pain. 25 tablet 6  . omeprazole (PRILOSEC) 20 MG capsule Take 20 mg by mouth daily.     Marland Kitchen oxyCODONE-acetaminophen (PERCOCET) 10-325 MG tablet Take 1-2 tablets by mouth every 4 (four) hours as needed for pain. 60 tablet 0  . pregabalin (LYRICA) 50 MG capsule Limit 1 tablet by mouth per day or twice per day if tolerated (Patient taking differently: Take 50 mg by mouth 2 (two) times daily. Pt. Reports that she is holding dose since last month) 60 capsule 2  . QUEtiapine (SEROQUEL) 300 MG tablet  Take 1 tablet (300 mg total) by mouth at bedtime. 90 tablet 1  . RESTASIS 0.05 % ophthalmic emulsion Place 1 drop into both eyes daily as needed (for dryness).     . sitaGLIPtin (JANUVIA) 100 MG tablet Take 100 mg by mouth daily.    Marland Kitchen Spacer/Aero-Holding Chambers (E-Z SPACER) inhaler Reported on 01/15/2016    .  VIIBRYD 40 MG TABS TAKE 1 TABLET DAILY 90 tablet 2  . zolpidem (AMBIEN) 5 MG tablet Take 1 tablet (5 mg total) by mouth at bedtime. 30 tablet 1   No current facility-administered medications for this visit.     Medical Decision Making:  Review of Psycho-Social Stressors (1) and Review of Last Therapy Session (1)  Treatment Plan Summary:Medication management   Anxiety Patient will continue on Viibryd 58m po qam as prescribed.  D/c Xanax  She agreed with the plan.  Mood symptoms She will continue on Seroquel 300 mg pill at bedtime. .  Patient is also on lithium carbonate  450 mg by mouth daily at bedtime   Insomnia Patient takes Ambien 5 mg at bedtime and discussed with her about the adverse effects of the medication in detail and she demonstrated understanding  Follow-up She will follow up in 1 months or earlier depending on her symptoms     More than 50% of the time spent in psychoeducation, counseling and coordination of care.     This note was generated in part or whole with voice recognition software. Voice regonition is usually quite accurate but there are transcription errors that can and very often do occur. I apologize for any typographical errors that were not detected and corrected.    URainey Pines MD  06/09/2017, 1:19 PM

## 2017-06-23 ENCOUNTER — Ambulatory Visit: Admitting: Licensed Clinical Social Worker

## 2017-07-01 ENCOUNTER — Ambulatory Visit: Admitting: Licensed Clinical Social Worker

## 2017-07-01 DIAGNOSIS — F3130 Bipolar disorder, current episode depressed, mild or moderate severity, unspecified: Secondary | ICD-10-CM
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
## 2017-07-14 ENCOUNTER — Ambulatory Visit: Admitting: Cardiovascular Disease

## 2017-07-15 ENCOUNTER — Ambulatory Visit (INDEPENDENT_AMBULATORY_CARE_PROVIDER_SITE_OTHER): Admitting: Licensed Clinical Social Worker

## 2017-07-15 DIAGNOSIS — F3130 Bipolar disorder, current episode depressed, mild or moderate severity, unspecified: Secondary | ICD-10-CM

## 2017-07-15 NOTE — Progress Notes (Signed)
   THERAPIST PROGRESS NOTE  Session Time:53mn  Participation Level: Active  Behavioral Response: CasualAlertDepressed  Type of Therapy: Individual Therapy  Treatment Goals addressed: Coping  Interventions: CBT, Motivational Interviewing and Family Systems  Summary: Jennifer DURENis a 61y.o. female who presents with continued symptoms of her diagnosis.  Patient reports that she is "sad". She was able to narrate how her grandchildren were placed in D[place]ustody. She reports that she gets visits but wants them to live with her son.  She was able to acknowledge his faults and how she can help him improve his addiction.  Suicidal/Homicidal: No  Therapist Response: Therapist met with Patient in an initial therapy session to assess current mood and to build rapport. Therapist engaged Patient in discussion about her life and what is going well for her. Therapist provided support for Patient as she shared details about her life, her current stressors, mood, coping skills, her past, and her children. Therapist prompted Patient to discuss her support system and ways that she manages her daily stress, anger, and frustrations. LCSW discussed what psychotherapy is and is not and the importance of the therapeutic relationship to include open and honest communication between client and therapist and building trust.  Reviewed advantages and disadvantages of the therapeutic process and limitations to the therapeutic relationship including LCSW's role in maintaining the safety of the client, others and those in client's care.   Plan: Return again in 1 weeks.  Diagnosis: Axis I: Bipolar, Depressed    Axis II: No diagnosis    NLubertha South LCSW 07/15/2017

## 2017-07-29 ENCOUNTER — Ambulatory Visit (INDEPENDENT_AMBULATORY_CARE_PROVIDER_SITE_OTHER): Admitting: Licensed Clinical Social Worker

## 2017-07-29 DIAGNOSIS — F3130 Bipolar disorder, current episode depressed, mild or moderate severity, unspecified: Secondary | ICD-10-CM

## 2017-07-29 DIAGNOSIS — F411 Generalized anxiety disorder: Secondary | ICD-10-CM
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
## 2017-08-08 IMAGING — CT CT L SPINE W/O CM
3 series · 15 of 33 positions shown, 18 images · non-contrast
Comparison: 09/16/2016

CLINICAL DATA: Lumbar radiculopathy.  Preop MOB protocol.

EXAM:
CT LUMBAR SPINE WITHOUT CONTRAST
TECHNIQUE: Multidetector CT imaging of the lumbar spine was performed without
intravenous contrast administration. Multiplanar CT image
reconstructions were also generated.

[Series 8: l-spine 2.0 sag st · sagittal · 0.42mm/px · 5 of 85 slices shown, 6 images]
[im 29/85  bone]
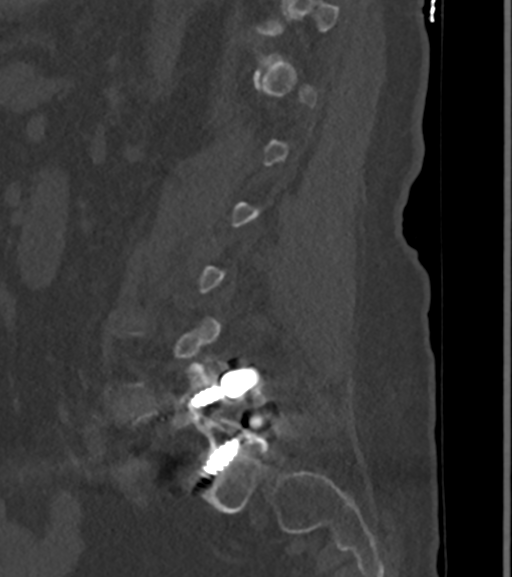
[im 36/85  bone]
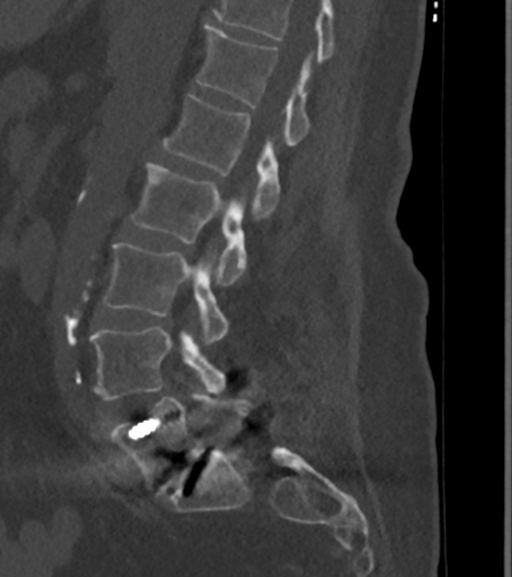
[im 43/85  soft-tissue]
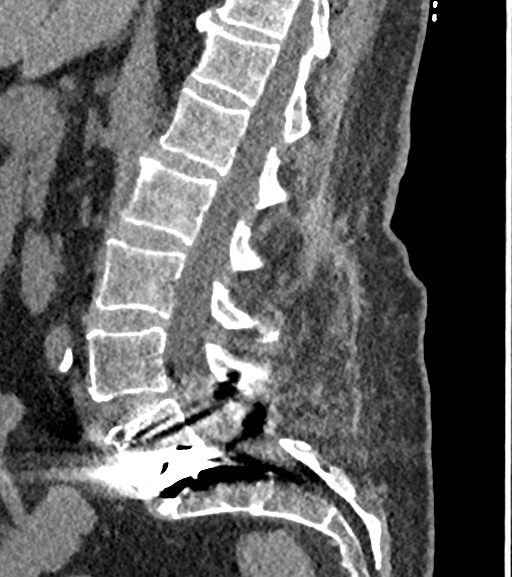
[im 43/85  bone]
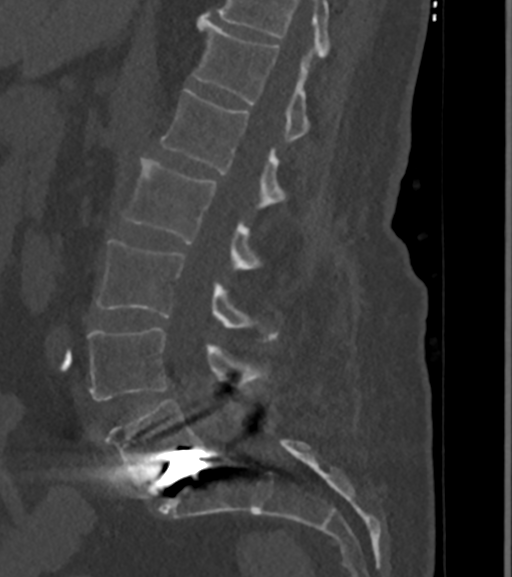
[im 50/85  bone]
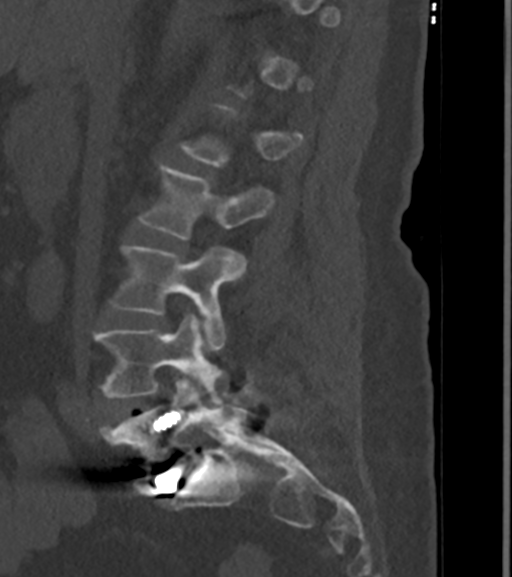
[im 57/85  bone]
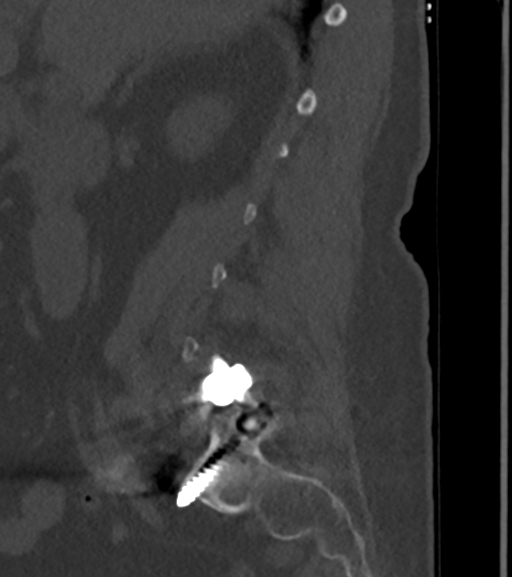

[Series 9: l-spine 2.0 cor st · coronal · 0.40mm/px · 3 of 89 slices shown]
[im 18/89  bone]
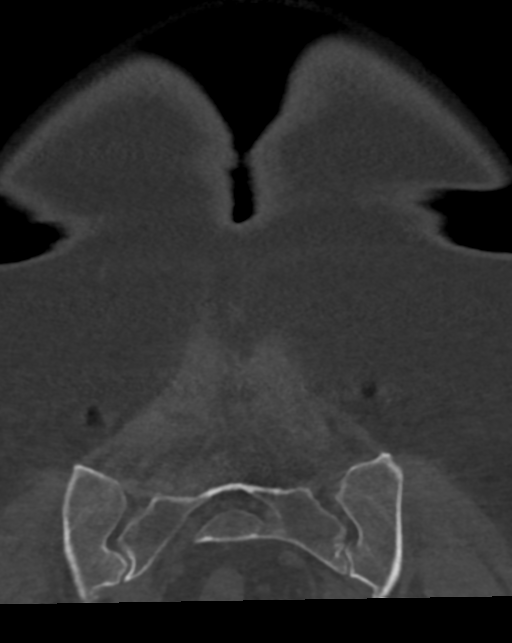
[im 36/89  bone]
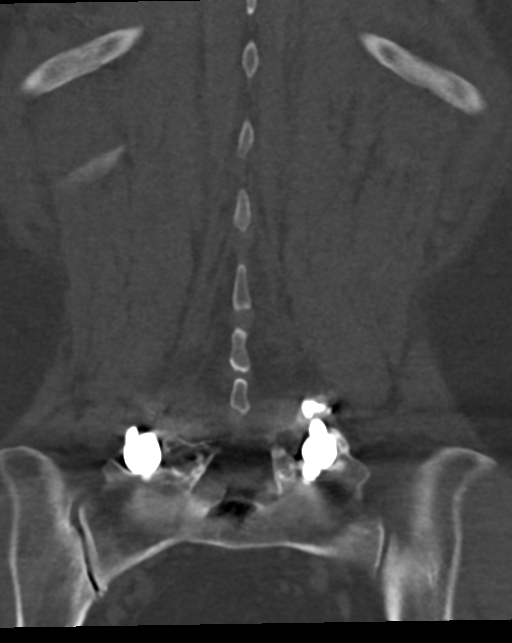
[im 53/89  bone]
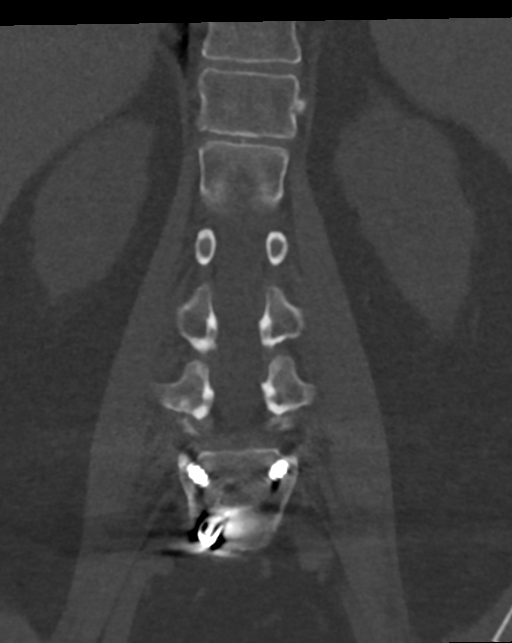

[Series 11: l-spine st ax 2.0 · axial · 0.39mm/px · z∈[+573,+775]mm · 7 of 121 slices shown, 9 images]
[im 10/121  soft-tissue]
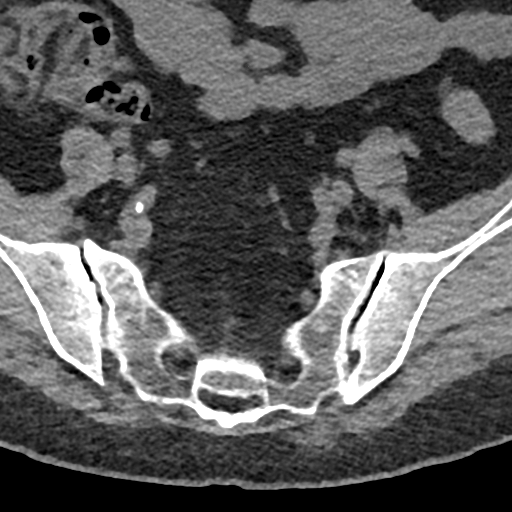
[im 10/121  bone]
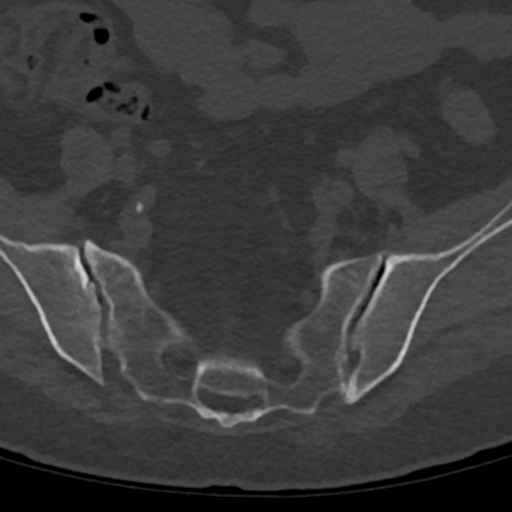
[im 28/121  bone]
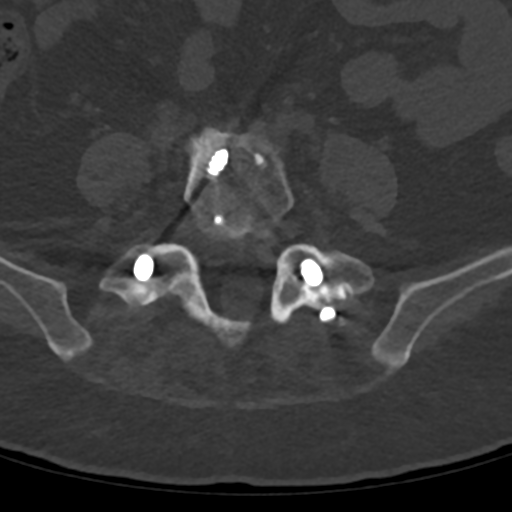
[im 47/121  bone]
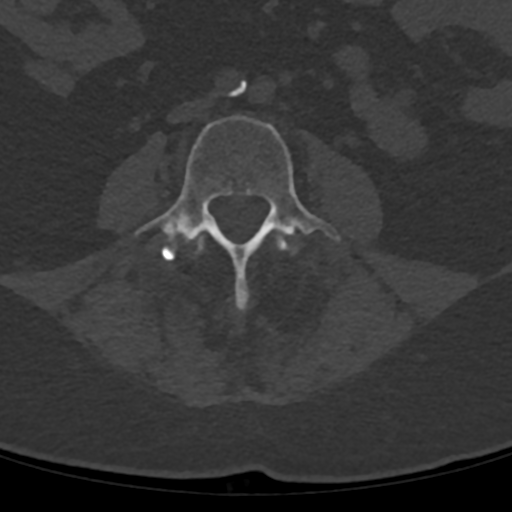
[im 65/121  bone]
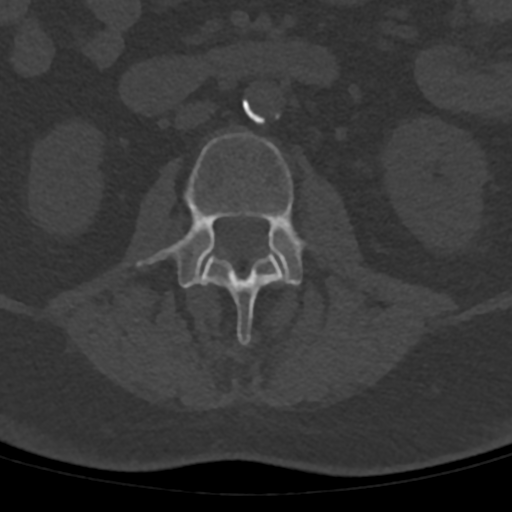
[im 74/121  soft-tissue]
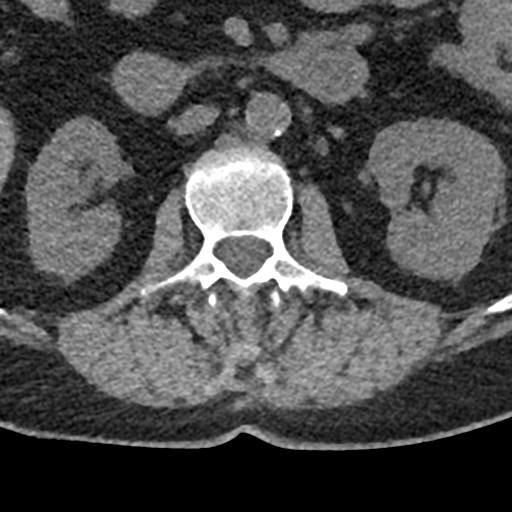
[im 74/121  bone]
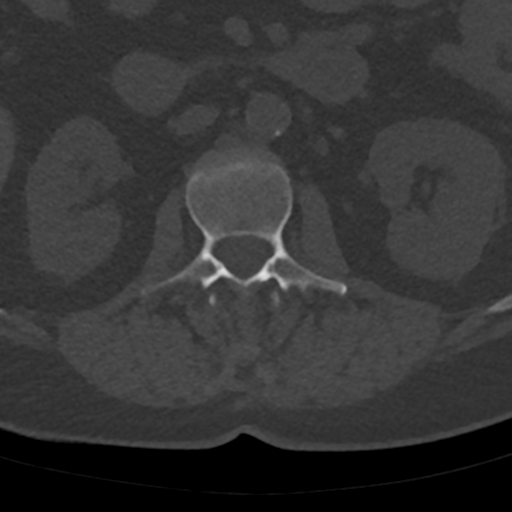
[im 93/121  bone]
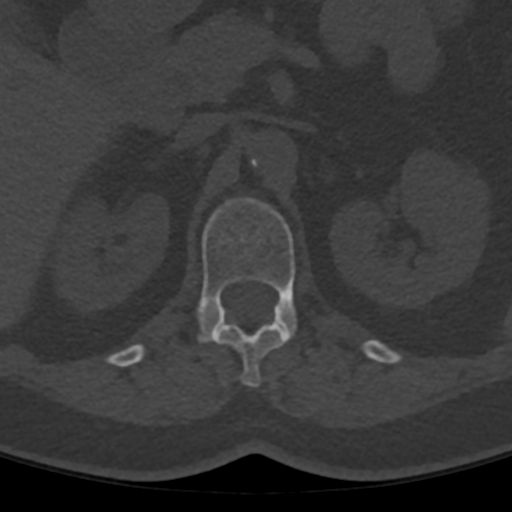
[im 111/121  bone]
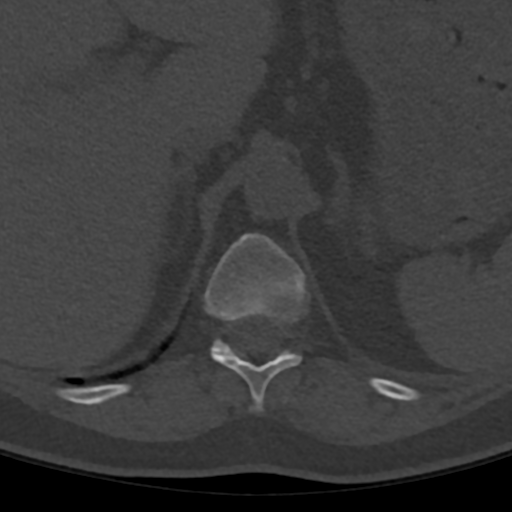

[15 of 33 positions shown; findings below may reference images not displayed]

FINDINGS: Segmentation: 5 lumbar type vertebrae.

Alignment: Unchanged and near anatomic.

Vertebrae: No evidence of fracture or destructive osseous lesion.

Paraspinal and other soft tissues: Abdominal aortic atherosclerosis
without aneurysm.

Disc levels:

L1-2: Slight disc space narrowing with mild degenerative endplate
sclerosis and spurring. Minimal disc bulging without stenosis,
unchanged.

L2-3:  Negative.

L3-4:  Negative.

L4-5: Disc bulging and advanced facet arthrosis result in mild
bilateral neural foraminal stenosis, unchanged. Metallic streak
artifact limits assessment of the spinal canal.

L5-S1: Prior posterior decompression and fusion. Bilateral pedicle
screws remain in place without evidence of loosening. An interbody
fusion device is again noted. There is solid facet ankylosis
bilaterally. No evidence of osseous neural foraminal stenosis.
Metallic streak artifact limits assessment of the spinal canal.
IMPRESSION: 1. Preop MOB protocol CT.
2. Unchanged appearance of L5-S1 fusion and advanced L4-5 facet
arthrosis.

## 2017-08-11 ENCOUNTER — Ambulatory Visit: Admitting: Psychiatry

## 2017-08-12 ENCOUNTER — Ambulatory Visit (INDEPENDENT_AMBULATORY_CARE_PROVIDER_SITE_OTHER): Admitting: Psychiatry

## 2017-08-12 ENCOUNTER — Ambulatory Visit: Admitting: Licensed Clinical Social Worker

## 2017-08-12 ENCOUNTER — Encounter: Payer: Self-pay | Admitting: Psychiatry

## 2017-08-12 VITALS — BP 174/82 | HR 85 | Temp 97.3°F | Wt 169.4 lb

## 2017-08-12 DIAGNOSIS — F3130 Bipolar disorder, current episode depressed, mild or moderate severity, unspecified: Secondary | ICD-10-CM | POA: Diagnosis not present

## 2017-08-12 DIAGNOSIS — F5105 Insomnia due to other mental disorder: Secondary | ICD-10-CM

## 2017-08-12 DIAGNOSIS — F99 Mental disorder, not otherwise specified: Secondary | ICD-10-CM | POA: Diagnosis not present

## 2017-08-12 DIAGNOSIS — F411 Generalized anxiety disorder: Secondary | ICD-10-CM

## 2017-08-12 MED ORDER — QUETIAPINE FUMARATE 25 MG PO TABS: 25.0000 mg | ORAL_TABLET | Freq: Two times a day (BID) | ORAL | 0 refills | Status: DC | PRN
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
## 2017-08-13 NOTE — Progress Notes (Signed)
   THERAPIST PROGRESS NOTE  Session Time:60min  Participation Level: Active  Behavioral Response: CasualAlertEuphoric  Type of Therapy: Individual Therapy  Treatment Goals addressed: Coping  Interventions: CBT, Motivational Interviewing and Family Systems  Summary: Jennifer [name] is a 61 y.o. female who presents with continued symptoms of her diagnosis.  Patient initially reported that she has not had any depressive symptoms until we began to discuss the symptoms of her diagnosis.  Patient reports difficulty concentrating and poor energy.  She reports that she cycles with her appetite.  Patient reports that she is making slow progress.  Patient was interactive and open about learning how to increase her positive thinking.  Patient reported that she will attempt utilize coping strategies daily. Suicidal/Homicidal: No  Therapist Response: Therapist initiated today's visit by probing and eliciting information from Patient about her depressive symptoms to determine her progress on the goal. Therapist educated Patient on her diagnosis and the symptoms that she displays. Therapist questioned Patient about her perspective on whether or not she's made progress on her treatment goals. Therapist facilitated a therapeutic activity with Patient to assist her with recognizing and identifying her negative thinking patterns and to assist with increasing her positive thinking skills. Therapist reviewed and educated Patient on tools and coping skills to assist Patient with working towards achieving her treatment goals.  Plan: Return again in 1 weeks.  Diagnosis: Axis I: Bipolar, Depressed    Axis II: No diagnosis    Lubertha South, LCSW 08/13/2017

## 2017-08-15 NOTE — Progress Notes (Signed)
Patient ID: ZOLA RUNION, female   DOB: 1956/05/25, 61 y.o.   MRN: [id number] Cardiology Office Note  Date:  08/18/2017   ID:  Natividad, Schlosser [birth year], MRN [id number]  PCP:  Valera Castle, MD   Chief Complaint  Patient presents with  . other    6 month f/u c/o chest pain. Meds reviewed verbally with pt.    HPI:  61 year old woman with a history of  Medication noncompliance coronary artery disease,  PTCA of the LAD with 2.5 x 28 mm Cypher stent in January 2007,  Repeat catheterization August 2011 with Stent placement of the mid LAD for 90% lesion, catheterization August 06, 2011 for chest pain that showed severe mid LAD in-stent restenosis at the site of the previous drug-eluting stent ( DES stent x2 placed in the LAD in 2007 and 2011), also with severe mid RCA disease, moderate to severe proximal diagonal #1 disease, ejection fraction 55%. hospitalization at [facility] on 12/09/2013 bypass surgery at [place] on August 12 2011 by Dr. Roxan Hockey. bypass graft x3 with a LIMA to the LAD, vein graft to the RCA, vein graft to the diagonal. She presents for routine follow-up of her coronary artery disease  Last catheterization June 03, 2013 Diabetes poorly controlled   Stress test 04/2016 No ischemia  Stress at home, son and daughter in law tested positive for drugs, Lost kids.  She is trying to be supportive and go to their NA meetings  She continues to see pain clinic, psychiatry  Poor diet,  Has not seen [facility] recently, last seen 09/2016  difficulty with medication compliance  Diabetes uncontrolled, hemoglobin A1c greater than 9  EKG on today's visit shows normal sinus rhythm with rate 90 bpm, no significant ST or T-wave changes  Other past medical history problems with Chronic anxiety, stress at home, confusion at times. Treated by a psychiatrist in [place], started on Valium. She is tired all time, sleeping a good number of hours per day. In the  past she has taken benzodiazepines, Prozac. previously on Lamictal and Cymbalta, She sees Dr. Eliberto Ivory, also sees Dr. Leilani Merl in [place]. She does travel up to [place] to help family .   cardiac catheterization 06/03/2013. 100% occluded mid LAD at the site of a stent, mild mid RCA disease, saphenous vein graft to the diagonal and LIMA graft to the LAD are patent. Occluded saphenous vein graft to the distal RCA. Medical management was recommended.  July 08 2012 right-sided chest pain. She ruled out for myocardial infarction by cardiac enzymes.  nuclear stress test which showed no evidence of ischemia with an ejection fraction of 44%. She was noted to be hypertensive with systolic blood pressure of 180.   PMH:   has a past medical history of Anemia (1985); Anxiety; Arthritis; Bipolar depression ([place]); Blood dyscrasia; CAD (coronary artery disease); [facility]); COPD (chronic obstructive pulmonary disease) ([place]); Depression; Depression with anxiety; Diabetes mellitus; Dyspnea; Enlarged liver; Family history of adverse reaction to anesthesia; Fibromyalgia; Generalized anxiety disorder; GERD (gastroesophageal reflux disease); Hyperlipidemia; Hypertension; Low back pain; Migraines; Neuropathy; Persistent disorder of initiating or maintaining sleep; and Von Willebrand disease ([place]).  PSH:    Past Surgical History:  Procedure Laterality Date  . ABDOMINAL HYSTERECTOMY    . 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 APPLICATION OF ROBOTIC ASSISTANCE FOR SPINAL PROCEDURE N/A 12/10/2016   Procedure:  APPLICATION OF ROBOTIC ASSISTANCE FOR SPINAL PROCEDURE;  Surgeon: Kevan Ny Ditty, MD;  Location: [facility];  Service: Neurosurgery;  Laterality: N/A;  . BACK SURGERY  2007   lumbar  . BREAST IMPLANT REMOVAL    . CARDIAC CATHETERIZATION     CAD,PCI of LAD, Cypher stent 2.5-28mm  . CARDIAC CATHETERIZATION     PCI of mid-LAD in stent restenosis with a drug-eluting stent  . CARDIAC CATHETERIZATION  06/03/13   [facility];MID LAD 100 % STENOSIS; PRIOR STENT; MID RCA 40 % STENOSIS  . CORONARY ARTERY BYPASS GRAFT   08-12-2011   CABG x 3  . CORONARY STENT PLACEMENT    . LUMBAR FUSION     L1-S1  . LUMBAR PERCUTANEOUS PEDICLE SCREW 1 LEVEL N/A 12/10/2016   Procedure: Extension of pedicle screw fixation to LUMBAR FOUR;  Surgeon: Kevan Ny Ditty, MD;  Location: [facility];  Service: Neurosurgery;  Laterality: N/A;  . NASAL SINUS SURGERY  1997   tuirbinate reduction     Current Outpatient Prescriptions  Medication Sig Dispense Refill  . albuterol-ipratropium (COMBIVENT) 18-103 MCG/ACT inhaler Inhale 2 puffs into the lungs daily as needed for wheezing or shortness of breath.     Marland Kitchen amLODipine (NORVASC) 5 MG tablet Take 1 tablet (5 mg total) by mouth 2 (two) times daily. (Patient taking differently: Take 5 mg by mouth daily at 2 PM. ) 180 tablet 4  . Blood Glucose Monitoring Suppl (FREESTYLE FREEDOM LITE) W/DEVICE KIT Use 3 (three) times daily. 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 Dx Code: 250.00    . carisoprodol (SOMA) 350 MG tablet Take 1 tablet (350 mg total) by mouth every 6 (six) hours as needed for muscle spasms. 120 tablet 0  . carvedilol (COREG) 12.5 MG tablet Take 1 tablet (12.5 mg total) by mouth 2 (two) times daily with a meal. 180 tablet 3  . fluticasone (FLONASE) 50 MCG/ACT nasal spray Place 1 spray into both nostrils 2 (two) times daily as needed for allergies.    . fluticasone (FLOVENT HFA) 110 MCG/ACT inhaler Inhale 2 puffs into the lungs 2 (two) times daily.    Marland Kitchen HUMULIN 70/30 KWIKPEN  (70-30) 100 UNIT/ML PEN Inject 20 Units into the skin 2 (two) times daily.     Marland Kitchen HYDROcodone-acetaminophen (NORCO) 10-325 MG tablet Take 1 tablet by mouth every 6 (six) hours as needed.    Marland Kitchen lisinopril (PRINIVIL,ZESTRIL) 20 MG tablet TAKE 1 TABLET DAILY 90 tablet 3  . lithium carbonate (ESKALITH) 450 MG CR tablet Take 1 tablet (450 mg total) by mouth daily at 2 PM. 90 tablet 1  . metFORMIN (GLUCOPHAGE-XR) 750 MG 24 hr tablet Take 1,500 mg by mouth daily with supper.    . nitroGLYCERIN (NITROSTAT) 0.4 MG SL tablet Place 1 tablet (0.4 mg total) under the tongue every 5 (five) minutes as needed for chest pain. 25 tablet 6  . omeprazole (PRILOSEC) 20 MG capsule Take 20 mg by mouth daily.     . QUEtiapine (SEROQUEL) 25 MG tablet Take 1 tablet (25 mg total) by mouth 2 (two) times daily as needed. For severe anxiety/ agitation 45 tablet 0  . QUEtiapine (SEROQUEL) 300 MG tablet Take 1 tablet (300 mg total) by mouth at bedtime. 90 tablet 1  .  RESTASIS 0.05 % ophthalmic emulsion Place 1 drop into both eyes daily as needed (for dryness).     Marland Kitchen rOPINIRole (REQUIP) 0.5 MG tablet Take by mouth.    . sitaGLIPtin (JANUVIA) 100 MG tablet Take 100 mg by mouth daily.    Marland Kitchen Spacer/Aero-Holding Chambers (E-Z SPACER) inhaler Reported on 01/15/2016    . VIIBRYD 40 MG TABS TAKE 1 TABLET DAILY 90 tablet 2  . zolpidem (AMBIEN) 5 MG tablet Take 1 tablet (5 mg total) by mouth at bedtime. 30 tablet 2   No current facility-administered medications for this visit.      Allergies:   No known allergies   Social History:  The patient  reports that she quit smoking about 7 years ago. Her smoking use included Cigarettes. She quit after 16.00 years of use. She has never used smokeless tobacco. She reports that she does not drink alcohol or use drugs.   Family History:   family history includes Alcohol abuse in her brother, father, and mother; Aneurysm in her brother and father; Diabetes in her father, mother, and other;  Hyperlipidemia in her other; Hypertension in her brother, father, and other.    Review of Systems: Review of Systems  Constitutional: Negative.   Respiratory: Negative.   Cardiovascular: Positive for chest pain.  Gastrointestinal: Negative.   Musculoskeletal: Negative.   Neurological: Negative.   Psychiatric/Behavioral: The patient is nervous/anxious.   All other systems reviewed and are negative.    PHYSICAL EXAM: VS:  BP 108/70 (BP Location: Left Arm, Patient Position: Sitting, Cuff Size: Normal)   Pulse 90   Ht '5\' 3"'  (1.6 m)   Wt 155 lb 12 oz (70.6 kg)   BMI 27.59 kg/m  , BMI Body mass index is 27.59 kg/m.  No change from previous visit GEN: Well nourished, well developed, in no acute distress  HEENT: normal  Neck: no JVD, carotid bruits, or masses Cardiac: RRR; no murmurs, rubs, or gallops,no edema  Respiratory:  clear to auscultation bilaterally, normal work of breathing GI: soft, nontender, nondistended, + BS  MS: no deformity or atrophy  Skin: warm and dry, no rash Neuro:  Strength and sensation are intact Psych: euthymic mood, full affect    Recent Labs: 10/30/2016: ALT 17 12/11/2016: BUN 8; Creatinine, Ser 0.82; Hemoglobin 10.0; Platelets 207; Potassium 3.9; Sodium 138    Lipid Panel Lab Results  Component Value Date   CHOL 200 07/09/2012   HDL 20 (L) 07/09/2012   LDLCALC SEE COMMENT 07/09/2012   TRIG 679 (H) 07/09/2012      Wt Readings from Last 3 Encounters:  08/18/17 155 lb 12 oz (70.6 kg)  12/10/16 158 lb (71.7 kg)  12/04/16 158 lb 3.2 oz (71.8 kg)       ASSESSMENT AND PLAN:  Chest pain, unspecified chest pain type - Atypical in nature, no changes to her medications Lots of stress.  Previous stress test end of 2017  Angina pectoris, unspecified ([place]) -  Previous stress test July 2017 with no ischemia normal ejection fraction  CAD in native artery - Plan: NM Myocar Multi W/Spect W/Wall Motion / EF Known severe three-vessel disease,  bypass surgery 2012 Atypical chest pain, periodically takes nitro, likely secondary to stress and anxiety Long-standing history of atypical left-sided chest pain.  Recently pulled a muscle in her left chest, hurts on palpation  Hyperlipidemia Long discussion concerning her medication noncompliance. Stressed importance of being compliant with her Lipitor Goal LDL less than 70 New prescription sent in  [place] We have encouraged her to continue to work on weaning her cigarettes and smoking cessation. She will continue to work on this and does not want any assistance with chantix.   Type 2 diabetes mellitus with other circulatory complication ([place]) Poorly controlled diabetes secondary to medication non compliance Stressed the importance of taking her metformin and insulin Has not seen primary care since December 2017, she is going to call for an appointment  Depression/anxiety Likely affecting her medication compliance No family with her on her visit today to discuss medication compliance issues Long history of medication noncompliance   Total encounter time more than 25 minutes  Greater than 50% was spent in counseling and coordination of care with the patient   Disposition:   F/U  6 months   No orders of the defined types were placed in this encounter.    Signed, Esmond Plants, M.D., Ph.D. 08/18/2017  [place], [place]

## 2017-08-18 ENCOUNTER — Encounter: Payer: Self-pay | Admitting: Cardiovascular Disease

## 2017-08-18 ENCOUNTER — Ambulatory Visit (INDEPENDENT_AMBULATORY_CARE_PROVIDER_SITE_OTHER): Admitting: Cardiovascular Disease

## 2017-08-18 VITALS — BP 108/70 | HR 90 | Ht 63.0 in | Wt 155.8 lb

## 2017-08-18 DIAGNOSIS — F17200 Nicotine dependence, unspecified, uncomplicated: Secondary | ICD-10-CM

## 2017-08-18 DIAGNOSIS — I25118 Atherosclerotic heart disease of native coronary artery with other forms of angina pectoris: Secondary | ICD-10-CM | POA: Diagnosis not present

## 2017-08-18 DIAGNOSIS — E1159 Type 2 diabetes mellitus with other circulatory complications: Secondary | ICD-10-CM | POA: Diagnosis not present

## 2017-08-18 DIAGNOSIS — I10 Essential (primary) hypertension: Secondary | ICD-10-CM

## 2017-08-18 DIAGNOSIS — E782 Mixed hyperlipidemia: Secondary | ICD-10-CM

## 2017-08-18 MED ORDER — NITROGLYCERIN 0.4 MG SL SUBL: 0.4000 mg | SUBLINGUAL_TABLET | SUBLINGUAL | 6 refills | Status: AC | PRN

## 2017-08-18 MED ORDER — CARVEDILOL 12.5 MG PO TABS: 12.5000 mg | ORAL_TABLET | Freq: Two times a day (BID) | ORAL | 3 refills | Status: DC

## 2017-08-18 MED ORDER — ATORVASTATIN CALCIUM 80 MG PO TABS: 80.0000 mg | ORAL_TABLET | Freq: Every day | ORAL | 3 refills | Status: DC

## 2017-08-18 MED ORDER — LISINOPRIL 20 MG PO TABS: 20.0000 mg | ORAL_TABLET | Freq: Every day | ORAL | 3 refills | Status: DC

## 2017-08-18 MED ORDER — AMLODIPINE BESYLATE 5 MG PO TABS: 5.0000 mg | ORAL_TABLET | Freq: Two times a day (BID) | ORAL | 4 refills | Status: DC

## 2017-08-18 NOTE — Patient Instructions (Signed)
Medication Instructions:   Please restart atorvastatin one a day for cholesterol   Labwork:  No new labs needed  Testing/Procedures:  No further testing at this time   Follow-Up: It was a pleasure seeing you in the office today. Please call us if you have new issues that need to be addressed before your next appt.  [phone]  Your physician wants you to follow-up in: 6 months.  You will receive a reminder letter in the mail two months in advance. If you don't receive a letter, please call our office to schedule the follow-up appointment.  If you need a refill on your cardiac medications before your next appointment, please call your pharmacy.

## 2017-08-19 NOTE — Addendum Note (Signed)
Addended by: Britt Bottom on: 08/19/2017 03:36 PM   Modules accepted: Orders

## 2017-08-26 ENCOUNTER — Ambulatory Visit: Admitting: Psychiatry

## 2017-08-28 ENCOUNTER — Ambulatory Visit (INDEPENDENT_AMBULATORY_CARE_PROVIDER_SITE_OTHER): Admitting: Licensed Clinical Social Worker

## 2017-08-28 DIAGNOSIS — F3130 Bipolar disorder, current episode depressed, mild or moderate severity, unspecified: Secondary | ICD-10-CM | POA: Diagnosis not present

## 2017-08-28 NOTE — Progress Notes (Signed)
   THERAPIST PROGRESS NOTE  Session Time: 25min  Participation Level: Active  Behavioral Response: CasualAlertEuthymic  Type of Therapy: Individual Therapy  Treatment Goals addressed: Coping  Interventions: CBT, Motivational Interviewing and Supportive  Summary: Jennifer [name] is a 61 y.o. female who presents with continued symptoms of her diagnosis.  Patient reports that she has been stressed out due to her losing custody of her Grandchildren.  Patient reports that she and her husband's relationship has become strained due to all of the custody and SA usage by son and daughter in law.  Patient reports that she is unsure how to calm down and relax when so many things have taken place.   Suicidal/Homicidal: No  Therapist Response: THerapist actively listened as Patient discussed her current stressors.  Therapist explored setting boundaries and expressing feelings to her family.  Therapist gave options for stress relief activities.  Plan: Return again in 2 weeks.  Diagnosis: Axis I: Bipolar    Axis II: No diagnosis    Lubertha South, LCSW 08/28/2017

## 2017-09-08 ENCOUNTER — Encounter: Payer: Self-pay | Admitting: Psychiatry

## 2017-09-08 ENCOUNTER — Ambulatory Visit (INDEPENDENT_AMBULATORY_CARE_PROVIDER_SITE_OTHER): Admitting: Psychiatry

## 2017-09-08 VITALS — BP 136/88 | HR 91 | Ht 63.0 in | Wt 161.0 lb

## 2017-09-08 DIAGNOSIS — F5105 Insomnia due to other mental disorder: Secondary | ICD-10-CM

## 2017-09-08 DIAGNOSIS — F3130 Bipolar disorder, current episode depressed, mild or moderate severity, unspecified: Secondary | ICD-10-CM | POA: Diagnosis not present

## 2017-09-08 DIAGNOSIS — F411 Generalized anxiety disorder: Secondary | ICD-10-CM | POA: Diagnosis not present

## 2017-09-08 MED ORDER — QUETIAPINE FUMARATE 300 MG PO TABS: 300.0000 mg | ORAL_TABLET | Freq: Every day | ORAL | 2 refills | Status: DC

## 2017-09-08 MED ORDER — BUSPIRONE HCL 5 MG PO TABS: 5.0000 mg | ORAL_TABLET | Freq: Three times a day (TID) | ORAL | 2 refills | Status: DC

## 2017-09-08 MED ORDER — QUETIAPINE FUMARATE 50 MG PO TABS: 50.0000 mg | ORAL_TABLET | Freq: Every day | ORAL | 2 refills | Status: DC

## 2017-09-08 MED ORDER — ZOLPIDEM TARTRATE 5 MG PO TABS: 5.0000 mg | ORAL_TABLET | Freq: Every evening | ORAL | 2 refills | Status: DC | PRN

## 2017-09-08 MED ORDER — VILAZODONE HCL 40 MG PO TABS: 40.0000 mg | ORAL_TABLET | Freq: Every day | ORAL | 2 refills | Status: DC

## 2017-09-08 NOTE — Progress Notes (Signed)
[place] MD Progress Note  09/08/2017 3:19 PM Jennifer [name]  MRN:  [id number]  Chief Complaint: ' I am still dealing with a lot."  HPI: Jennifer [name] is a 61 year old Caucasian female who lives in [place], married, has a history of bipolar disorder as well as anxiety issues, presented to the clinic for a follow-up visit.  Jennifer [name] reports that she was dealing with a lot of things.  She reports that she had to go for a court appointment regarding the custody of her grandkids aged 59 and 2.  She was finally able to bring the kids home.  She reports that their mother, her daughter-in-law is going to return home from [facility] program tomorrow.  She reports that she has been dealing with a lot of stressors taking care of the kids as well as dealing with CPS.  Her husband has been stressed out having anger issues and memory issues, work stress and so on.  She wants to get him back in therapy but she does not know if he is willing to.  She reports she has been compliant on her medications.  She reports that she continues to have a lot of anxiety symptoms.  She has tried hydroxyzine in the past and it did not help. She continues to follow up with Ms. Peacock, reports it is helpful.  She reports sleep is improved on the Ambien 5 mg, but she continues to have nights when she wakes up early.  When she takes a higher dose of Seroquel she is groggy in the morning.  She wants the Ambien dose to be increased to see if that will help her.  She reports she was not able to get the lithium level done because of everything that is going on in her life.  Discussed with her that the lithium dose cannot be changed without her getting the labs done.  Denies any side effects to the medications.  She denies any suicidal thoughts.   Visit Diagnosis:    ICD-10-CM   1. Bipolar I disorder, most recent episode depressed (HCC) F31.30 QUEtiapine (SEROQUEL) 300 MG tablet    QUEtiapine (SEROQUEL) 50 MG tablet   Vilazodone HCl (VIIBRYD) 40 MG TABS  2. GAD (generalized anxiety disorder) F41.1 busPIRone (BUSPAR) 5 MG tablet  3. Insomnia due to mental disorder F51.05 zolpidem (AMBIEN) 5 MG tablet    Past Psychiatric History: Bipolar disorder, diagnosed in the 18s.  She reports that her mood symptoms started when her husband was sent away for war.  She had a manic episode then, she cheated on her husband with 2 men and also did a lot of stuff which she regrets.  She does report periods of high energy and sleep issues.  She has history of several IP admissions for her bipolar disorder, [facility], [facility]  She reports suicide attempts x2/3 overdose.  Patient used to follow up with the Dr.Faheem in the past.  Past Medical History:  Past Medical History:  Diagnosis Date  . Anemia 1985   bled during a cervical biopsy, led to bleed transfusion   . Anxiety   . Arthritis    pt. reports that her neck is stiff   . Bipolar depression ([place])   . Blood dyscrasia    reported hx [name] '85; saw hematologist Dr. Janese Banks 10/2016, VWD work-up WNL, not felt to have a primary bleeding disorder   . CAD (coronary artery disease)   . Cancer ([place])    skin cancer on the  ear, cervical dysplasia   . COPD (chronic obstructive pulmonary disease) ([place])    dr. Gar [facility]    PCP  . Depression   . Depression with anxiety   . Diabetes mellitus   . Dyspnea    W/ EXERTION   . Enlarged liver   . Family history of adverse reaction to anesthesia    father woke up slowly  . Fibromyalgia   . Generalized anxiety disorder   . GERD (gastroesophageal reflux disease)   . Hyperlipidemia   . Hypertension   . Low back pain   . Migraines   . Neuropathy   . Persistent disorder of initiating or maintaining sleep   . Von Willebrand disease ([place])     Past Surgical History:  Procedure Laterality Date  . ABDOMINAL HYSTERECTOMY    . BACK SURGERY  2007   lumbar  . BREAST IMPLANT REMOVAL    . CARDIAC  CATHETERIZATION     CAD,PCI of LAD, Cypher stent 2.5-28mm  . CARDIAC CATHETERIZATION     PCI of mid-LAD in stent restenosis with a drug-eluting stent  . CARDIAC CATHETERIZATION  06/03/13   [facility];MID LAD 100 % STENOSIS; PRIOR STENT; MID RCA 40 % STENOSIS  . CORONARY ARTERY BYPASS GRAFT   08-12-2011   CABG x 3  . CORONARY STENT PLACEMENT    . LUMBAR FUSION     L1-S1  . NASAL SINUS SURGERY  1997   tuirbinate reduction     Family Psychiatric History: Mother has depression, alcoholism.  Maternal grandmother has depression.  Brother-anxiety disorder.  Brother-alcoholism.  Family History:  Family History  Problem Relation Age of Onset  . Aneurysm Father        abdominal  . Diabetes Father   . Alcohol abuse Father   . Hypertension Father   . Aneurysm Brother        abdominal  . Alcohol abuse Brother   . Hypertension Brother   . Diabetes Mother   . Alcohol abuse Mother   . Hypertension Other        family hx  . Hyperlipidemia Other        family hx  . Diabetes Other        family hx    Social History: Married, lives in [place], [place] with husband. Social History   Socioeconomic History  . Marital status: Married    Spouse name: None  . Number of children: None  . Years of education: None  . Highest education level: None  Social Needs  . Financial resource strain: None  . Food insecurity - worry: None  . Food insecurity - inability: None  . Transportation needs - medical: None  . Transportation needs - non-medical: None  Occupational History  . None  Tobacco Use  . Smoking status: Former Smoker    Years: 16.00    Types: Cigarettes    Last attempt to quit: 06/20/2010    Years since quitting: 7.2  . Smokeless tobacco: Never Used  Substance and Sexual Activity  . Alcohol use: No    Alcohol/week: 0.0 oz  . Drug use: No  . Sexual activity: Yes  Other Topics Concern  . None  Social History Narrative  . None    Allergies:  Allergies  Allergen Reactions  . No  Known Allergies     Metabolic Disorder Labs: Lab Results  Component Value Date   HGBA1C 8.5 (H) 10/30/2016   MPG 197  10/30/2016   MPG 183 (H) 08/11/2011   No results found for: PROLACTIN Lab Results  Component Value Date   CHOL 200 07/09/2012   TRIG 679 (H) 07/09/2012   HDL 20 (L) 07/09/2012   VLDL SEE COMMENT 07/09/2012   LDLCALC SEE COMMENT 07/09/2012   Lab Results  Component Value Date   TSH 0.664 06/16/2013    Therapeutic Level Labs: No results found for: LITHIUM No results found for: VALPROATE No components found for:  CBMZ  Current Medications: Current Outpatient Medications  Medication Sig Dispense Refill  . albuterol-ipratropium (COMBIVENT) 18-103 MCG/ACT inhaler Inhale 2 puffs into the lungs daily as needed for wheezing or shortness of breath.     Marland Kitchen amLODipine (NORVASC) 5 MG tablet Take 1 tablet (5 mg total) by mouth 2 (two) times daily. 180 tablet 4  . atorvastatin (LIPITOR) 80 MG tablet Take 1 tablet (80 mg total) by mouth daily. 90 tablet 3  . Blood Glucose Monitoring Suppl (FREESTYLE FREEDOM LITE) W/DEVICE KIT Use 3 (three) times daily. 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 Dx Code: 250.00    . busPIRone (BUSPAR) 5 MG tablet Take 1 tablet (5 mg total) 3 (three) times daily by mouth. 270 tablet 2  . carisoprodol (SOMA) 350 MG tablet Take 1 tablet (350 mg total) by mouth every 6 (six) hours as needed for muscle spasms. 120 tablet 0  . carvedilol (COREG) 12.5 MG tablet Take 1 tablet (12.5 mg total) by mouth 2 (two) times daily with a meal. 180 tablet 3  . fluticasone (FLONASE) 50 MCG/ACT nasal spray Place 1 spray into both nostrils 2 (two) times daily as needed for allergies.    . fluticasone (FLOVENT HFA) 110 MCG/ACT inhaler Inhale 2 puffs into the lungs 2 (two) times daily.    Marland Kitchen HUMULIN 70/30 KWIKPEN (70-30) 100 UNIT/ML PEN Inject 20 Units into the skin 2 (two) times daily.     Marland Kitchen HYDROcodone-acetaminophen (NORCO) 10-325 MG tablet Take 1 tablet by mouth  every 6 (six) hours as needed.    Marland Kitchen lisinopril (PRINIVIL,ZESTRIL) 20 MG tablet Take 1 tablet (20 mg total) by mouth daily. 90 tablet 3  . lithium carbonate (ESKALITH) 450 MG CR tablet Take 1 tablet (450 mg total) by mouth daily at 2 PM. 90 tablet 1  . metFORMIN (GLUCOPHAGE-XR) 750 MG 24 hr tablet Take 1,500 mg by mouth daily with supper.    . nitroGLYCERIN (NITROSTAT) 0.4 MG SL tablet Place 1 tablet (0.4 mg total) under the tongue every 5 (five) minutes as needed for chest pain. 25 tablet 6  . omeprazole (PRILOSEC) 20 MG capsule Take 20 mg by mouth daily.     . QUEtiapine (SEROQUEL) 25 MG tablet Take 1 tablet (25 mg total) by mouth 2 (two) times daily as needed. For severe anxiety/ agitation 45 tablet 0  . QUEtiapine (SEROQUEL) 300 MG tablet Take 1 tablet (300 mg total) at bedtime by mouth. 90 tablet 2  . QUEtiapine (SEROQUEL) 50 MG tablet Take 1 tablet (50 mg total) at bedtime by mouth. 90 tablet 2  . RESTASIS 0.05 % ophthalmic emulsion Place 1 drop into both eyes daily as needed (for dryness).     Marland Kitchen rOPINIRole (REQUIP) 0.5 MG tablet Take by mouth.    . sitaGLIPtin (JANUVIA) 100 MG tablet Take 100 mg by mouth daily.    Marland Kitchen Spacer/Aero-Holding Chambers (E-Z SPACER) inhaler Reported on 01/15/2016    . Vilazodone HCl (VIIBRYD) 40 MG TABS Take 1 tablet (40 mg total)  daily by mouth. 90 tablet 2  . zolpidem (AMBIEN) 5 MG tablet Take 1-2 tablets (5-10 mg total) at bedtime as needed by mouth for sleep. 60 tablet 2   No current facility-administered medications for this visit.      Musculoskeletal: Strength & Muscle Tone: within normal limits Gait & Station: normal Patient leans: N/A  Psychiatric Specialty Exam: Review of Systems  Psychiatric/Behavioral: Positive for depression. The patient is nervous/anxious and has insomnia.   All other systems reviewed and are negative.   Blood pressure 136/88, pulse 91, height _0  (1.6 m), weight 161 lb (73 kg).Body mass index is 28.52 kg/m.  General  Appearance: Casual  Eye Contact:  Good  Speech:  Normal Rate  Volume:  Normal  Mood:  Anxious and Dysphoric  Affect:  Tearful  Thought Process:  Goal Directed and Descriptions of Associations: Intact  Orientation:  Full (Time, Place, and Person)  Thought Content: Logical   Suicidal Thoughts:  No  Homicidal Thoughts:  No  Memory:  Immediate;   Fair Recent;   Fair Remote;   Fair  Judgement:  Fair  Insight:  Fair  Psychomotor Activity:  Normal  Concentration:  Concentration: Fair and Attention Span: Fair  Recall:  [employer] of Knowledge: Fair  Language: Fair  Akathisia:  No  Handed:  Right  AIMS (if indicated): denies tremors , rigidity  Assets:  Communication Skills Desire for Improvement Social Support  ADL's:  Intact  Cognition: WNL  Sleep:  Poor   Screenings: PHQ2-9     Office Visit from 05/09/2016 in [facility] Procedure visit from 03/20/2016 in [facility] Office Visit from 03/12/2016 in [facility] Clinical Support from 02/13/2016 in [facility] Office Visit from 12/14/2015 in [facility]  PHQ-2 Total Score  0  0  0  0  0       Assessment and Plan: Burdette is a 61 year old Caucasian female who has a history of bipolar disorder, anxiety disorder, who currently has several stressors including custody battle of of her grandchildren, drug abuse issues and her son and daughter-in-law, CPS being involved.  She continues to report anxiety and depression due to her current stressors.  She also has sleep issues.  Her medications are helping to some extent.  Discussed medication changes to target her current symptoms.  She will also continue psychotherapy with Ms. Peacock.  Plan as noted below.  Plan For bipolar disorder Increase Seroquel to 350 mg p.o. Nightly She  has not been taking the Seroquel 25 as needed for anxiety/agitation during the day due to grogginess. Lithium 450 mg p.o. nightly.  Pending lithium level- ordered.  For anxiety symptoms Viibryd 40 mg p.o. Daily BuSpar 5 mg p.o. 3 times daily  Insomnia Increase Ambien 5-10 mg p.o. nightly as needed. Seroquel 350 mg p.o. Nightly  Continue psychotherapy for anxiety, recent stressors  Pending lithium level, TSH, hemoglobin A1c, prolactin as well as EKG- ordered   Follow up in 6-8 weeks or sooner if needed  More than 50 % of the time was spent for psychoeducation and supportive psychotherapy and care coordination.  This note was generated in part or whole with voice recognition software. Voice recognition is usually quite accurate but there are transcription errors that can and very often do occur. I apologize for any typographical errors that were not detected and  corrected.       Ursula Alert, MD 09/08/2017, 3:19 PM

## 2017-09-08 NOTE — Patient Instructions (Signed)
buspar Buspirone tablets What is this medicine? BUSPIRONE (byoo SPYE rone) is used to treat anxiety disorders. This medicine may be used for other purposes; ask your health care provider or pharmacist if you have questions. COMMON BRAND NAME(S): BuSpar What should I tell my health care provider before I take this medicine? They need to know if you have any of these conditions: -kidney or liver disease -an unusual or allergic reaction to buspirone, other medicines, foods, dyes, or preservatives -pregnant or trying to get pregnant -breast-feeding How should I use this medicine? Take this medicine by mouth with a glass of water. Follow the directions on the prescription label. You may take this medicine with or without food. To ensure that this medicine always works the same way for you, you should take it either always with or always without food. Take your doses at regular intervals. Do not take your medicine more often than directed. Do not stop taking except on the advice of your doctor or health care professional. Talk to your pediatrician regarding the use of this medicine in children. Special care may be needed. Overdosage: If you think you have taken too much of this medicine contact a poison control center or emergency room at once. NOTE: This medicine is only for you. Do not share this medicine with others. What if I miss a dose? If you miss a dose, take it as soon as you can. If it is almost time for your next dose, take only that dose. Do not take double or extra doses. What may interact with this medicine? Do not take this medicine with any of the following medications: -linezolid -MAOIs like Carbex, Eldepryl, Marplan, Nardil, and Parnate -methylene blue -procarbazine This medicine may also interact with the following medications: -diazepam -digoxin -diltiazem -erythromycin -grapefruit juice -haloperidol -medicines for mental depression or mood problems -medicines for  seizures like carbamazepine, phenobarbital and phenytoin -nefazodone -other medications for anxiety -rifampin -ritonavir -some antifungal medicines like itraconazole, ketoconazole, and voriconazole -verapamil -warfarin This list may not describe all possible interactions. Give your health care provider a list of all the medicines, herbs, non-prescription drugs, or dietary supplements you use. Also tell them if you smoke, drink alcohol, or use illegal drugs. Some items may interact with your medicine. What should I watch for while using this medicine? Visit your doctor or health care professional for regular checks on your progress. It may take 1 to 2 weeks before your anxiety gets better. You may get drowsy or dizzy. Do not drive, use machinery, or do anything that needs mental alertness until you know how this drug affects you. Do not stand or sit up quickly, especially if you are an older patient. This reduces the risk of dizzy or fainting spells. Alcohol can make you more drowsy and dizzy. Avoid alcoholic drinks. What side effects may I notice from receiving this medicine? Side effects that you should report to your doctor or health care professional as soon as possible: -blurred vision or other vision changes -chest pain -confusion -difficulty breathing -feelings of hostility or anger -muscle aches and pains -numbness or tingling in hands or feet -ringing in the ears -skin rash and itching -vomiting -weakness Side effects that usually do not require medical attention (report to your doctor or health care professional if they continue or are bothersome): -disturbed dreams, nightmares -headache -nausea -restlessness or nervousness -sore throat and nasal congestion -stomach upset This list may not describe all possible side effects. Call your doctor for medical advice about  side effects. You may report side effects to FDA at 1-800-FDA-1088. Where should I keep my medicine? Keep out  of the reach of children. Store at room temperature below 30 degrees C (86 degrees F). Protect from light. Keep container tightly closed. Throw away any unused medicine after the expiration date. NOTE: This sheet is a summary. It may not cover all possible information. If you have questions about this medicine, talk to your doctor, pharmacist, or health care provider.  2018 Elsevier/Gold Standard (2010-05-24 18:06:11)

## 2017-12-05 ENCOUNTER — Other Ambulatory Visit: Payer: Self-pay

## 2017-12-05 ENCOUNTER — Ambulatory Visit (INDEPENDENT_AMBULATORY_CARE_PROVIDER_SITE_OTHER): Admitting: Psychiatry

## 2017-12-05 ENCOUNTER — Other Ambulatory Visit
Admission: RE | Admit: 2017-12-05 | Discharge: 2017-12-05 | Disposition: A | Discharge status: Home / Self Care | Source: Ambulatory Visit | Attending: Psychiatry | Admitting: Psychiatry

## 2017-12-05 ENCOUNTER — Encounter: Payer: Self-pay | Admitting: Psychiatry

## 2017-12-05 VITALS — BP 156/84 | HR 91 | Temp 99.1°F | Wt 167.6 lb

## 2017-12-05 DIAGNOSIS — F3130 Bipolar disorder, current episode depressed, mild or moderate severity, unspecified: Secondary | ICD-10-CM | POA: Insufficient documentation

## 2017-12-05 DIAGNOSIS — F5105 Insomnia due to other mental disorder: Secondary | ICD-10-CM | POA: Diagnosis not present

## 2017-12-05 DIAGNOSIS — F411 Generalized anxiety disorder: Secondary | ICD-10-CM

## 2017-12-05 LAB — COMPREHENSIVE METABOLIC PANEL
ALBUMIN: 4.9 g/dL (ref 3.5–5.0)
ALK PHOS: 112 U/L (ref 38–126)
ALT: 17 U/L (ref 14–54)
AST: 23 U/L (ref 15–41)
Anion gap: 14 (ref 5–15)
BUN: 14 mg/dL (ref 6–20)
CALCIUM: 10.3 mg/dL (ref 8.9–10.3)
CHLORIDE: 102 mmol/L (ref 101–111)
CO2: 21 mmol/L — ABNORMAL LOW (ref 22–32)
CREATININE: 0.79 mg/dL (ref 0.44–1.00)
GFR calc Af Amer: >60 mL/min (ref >60)
GFR calc non Af Amer: >60 mL/min (ref >60)
GLUCOSE: 257 mg/dL — AB (ref 65–99)
Potassium: 4.4 mmol/L (ref 3.5–5.1)
Sodium: 137 mmol/L (ref 135–145)
Total Bilirubin: 0.5 mg/dL (ref 0.3–1.2)
Total Protein: 8.4 g/dL — ABNORMAL HIGH (ref 6.5–8.1)

## 2017-12-05 LAB — CBC WITH DIFFERENTIAL/PLATELET
BASOS ABS: 0.1 10*3/uL (ref 0–0.1)
BASOS PCT: 1 %
EOS PCT: 1 %
Eosinophils Absolute: 0.1 10*3/uL (ref 0–0.7)
HCT: 39.7 % (ref 35.0–47.0)
Hemoglobin: 12.9 g/dL (ref 12.0–16.0)
Lymphocytes Relative: 23 %
Lymphs Abs: 1.9 10*3/uL (ref 1.0–3.6)
MCH: 28.6 pg (ref 26.0–34.0)
MCHC: 32.4 g/dL (ref 32.0–36.0)
MCV: 88.5 fL (ref 80.0–100.0)
Monocytes Absolute: 0.4 10*3/uL (ref 0.2–0.9)
Monocytes Relative: 4 %
NEUTROS ABS: 6 10*3/uL (ref 1.4–6.5)
Neutrophils Relative %: 71 %
PLATELETS: 261 10*3/uL (ref 150–440)
RBC: 4.49 MIL/uL (ref 3.80–5.20)
RDW: 15.6 % — AB (ref 11.5–14.5)
WBC: 8.5 10*3/uL (ref 3.6–11.0)

## 2017-12-05 LAB — TSH: TSH: 86 u[IU]/mL — AB (ref 0.350–4.500)

## 2017-12-05 LAB — LIPID PANEL
Cholesterol: 238 mg/dL — ABNORMAL HIGH (ref 0–200)
HDL: 41 mg/dL (ref >40)
LDL Cholesterol: UNDETERMINED mg/dL (ref 0–99)
Total CHOL/HDL Ratio: 5.8 RATIO
Triglycerides: 819 mg/dL — ABNORMAL HIGH (ref <150)
VLDL: UNDETERMINED mg/dL (ref 0–40)

## 2017-12-05 LAB — FOLATE: FOLATE: 12.1 ng/mL (ref >5.9)

## 2017-12-05 LAB — HEMOGLOBIN A1C
Hgb A1c MFr Bld: 9.4 % — ABNORMAL HIGH (ref 4.8–5.6)
MEAN PLASMA GLUCOSE: 223.08 mg/dL

## 2017-12-05 MED ORDER — ZOLPIDEM TARTRATE 5 MG PO TABS: 5.0000 mg | ORAL_TABLET | Freq: Every evening | ORAL | 2 refills | Status: DC | PRN

## 2017-12-05 NOTE — Progress Notes (Signed)
[facility] MD OP Progress Note  12/05/2017 10:36 AM CORLISS COGGESHALL  MRN:  [id number]  Chief Complaint: ' I am kind of overwhelmed.'  Chief Complaint    Follow-up; Medication Refill     HPI: Jennifer [name] is a 62 year old Caucasian female who lives in [place], married, has a history of bipolar disorder as well as anxiety issues, presented to the clinic for a follow-up visit.  Tenia today reports that she is currently doing better with the psychosocial stressors that she was going through.  She however continues to have periods when she feels overwhelmed.  She reports her grandchildren who are aged 73 and 2 yrs had to be taken to court due to CPS involvement.  The CPS got involved because of her son and daughter-in-law's drug abuse issue.  Her daughter-in-law and the children where at [facility] and completed the course.  She reports the court hearing went well and they got the custody of the children back.  Kately however reports that the whole situation was kind of traumatic especially for the 65-year-old.  She reports her grandson has periods when he is so anxious and scared and does not trust anyone.  She would like to take him to some kind of a therapist to see if there is anything that needs to be done.  Makinze reports the whole situation is stressful for her too.  She reports she continues to have periods when she feels overwhelmed and has outbursts.  She however reports it is getting better.  She reports she does not want to be on the BuSpar anymore.  She reports she is not taking it.  She also has has not been taking the Seroquel 25 mg as needed since it makes her too drowsy.  She continues to take the Viibryd, Seroquel at bedtime and the lithium.  She reports she was unable to get the lithium level done with her PMD.  She reports she will be able to get it done if writer can give her a lab slip today.  Discussed medication changes with patient.  Discussed with her that once she gets  the lithium level done a decision can be made about the lithium dosage.  She reports she wants to wait until the lithium level is back before any medication changes other than lithium.  She continues to report Ambien is helpful for her sleep.  She reports she has not had an appointment with Ms. Peacock in a long time.  Discussed with patient to reach out to Ms. Peacock as soon as possible to continue CBT.  Also provided her information of other therapist in the neighborhood who would be able to see her grandchild.   Visit Diagnosis:    ICD-10-CM   1. Bipolar I disorder, most recent episode depressed ([place]) F31.30   2. GAD (generalized anxiety disorder) F41.1   3. Insomnia due to mental disorder F51.05 zolpidem (AMBIEN) 5 MG tablet    Past Psychiatric History: Bipolar disorder, diagnosed in the 42s.  She reports that her mood symptoms started when her husband was sent away for more.  She had a manic episode then, she cheated on her husband with 2 men and also did a lot of stuff which she regrets.  She does report hx of periods when she had high energy and sleep issues.  She has history of several IP admissions for her bipolar disorder, [facility], [facility].  She reports suicide attempts x2-3  by overdose.  Patient used to  follow up with Dr. Gretel Acre in the past.  Past Medical History:  Past Medical History:  Diagnosis Date  . Anemia 1985   bled during a cervical biopsy, led to bleed transfusion   . Anxiety   . Arthritis    pt. reports that her neck is stiff   . Bipolar depression ([place])   . Blood dyscrasia    reported hx [name] '85; saw hematologist Dr. Janese Banks 10/2016, VWD work-up WNL, not felt to have a primary bleeding disorder   . CAD (coronary artery disease)   . 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 COPD (chronic obstructive pulmonary disease) ([place])    [facility]. Gar [facility]    PCP  . Depression   . Depression with anxiety   . Diabetes  mellitus   . Dyspnea    W/ EXERTION   . Enlarged liver   . Family history of adverse reaction to anesthesia    father woke up slowly  . Fibromyalgia   . Generalized anxiety disorder   . GERD (gastroesophageal reflux disease)   . Hyperlipidemia   . Hypertension   . Low back pain   . Migraines   . Neuropathy   . Persistent disorder of initiating or maintaining sleep   . Von Willebrand disease ([place])     Past Surgical History:  Procedure Laterality Date  . ABDOMINAL HYSTERECTOMY    . 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 APPLICATION OF ROBOTIC ASSISTANCE FOR SPINAL PROCEDURE N/A 12/10/2016   Procedure: APPLICATION OF ROBOTIC ASSISTANCE FOR SPINAL PROCEDURE;  Surgeon: Kevan Ny Ditty, MD;  Location: [facility];  Service: Neurosurgery;  Laterality: N/A;  . BACK SURGERY  2007   lumbar  . BREAST IMPLANT REMOVAL    . CARDIAC CATHETERIZATION     CAD,PCI of LAD, Cypher stent 2.5-28mm  . CARDIAC CATHETERIZATION     PCI of mid-LAD in stent restenosis with a drug-eluting stent  . CARDIAC CATHETERIZATION  06/03/13   [facility];MID LAD 100 % STENOSIS; PRIOR STENT; MID RCA 40 % STENOSIS  . CORONARY ARTERY BYPASS GRAFT   08-12-2011   CABG x 3  . CORONARY STENT PLACEMENT    . LUMBAR FUSION     L1-S1  . LUMBAR PERCUTANEOUS PEDICLE SCREW 1 LEVEL N/A 12/10/2016   Procedure: Extension of pedicle screw fixation to LUMBAR FOUR;  Surgeon: Kevan Ny Ditty, MD;  Location: [facility];  Service: Neurosurgery;  Laterality: N/A;  . NASAL SINUS SURGERY  1997   tuirbinate reduction     Family Psychiatric History:  Mother has depression, alcoholism.  Maternal grandmother has depression.  Brother-anxiety disorder.  Brother-alcoholism  Family History:  Family History  Problem Relation Age of Onset  . Aneurysm Father        abdominal  . Diabetes Father   . Alcohol abuse Father   . Hypertension Father   . Aneurysm Brother        abdominal  . Alcohol abuse Brother   . Hypertension Brother   . Diabetes Mother   . Alcohol abuse Mother   .  Hypertension Other        family hx  . Hyperlipidemia Other        family hx  . Diabetes Other        family hx   Substance abuse history: Denies  Social History: Married, lives in [place], [place] with husband.  Her son, daughter-in-law and 2 children lives with them. Social History   Socioeconomic History  . Marital status: Married    Spouse name: None  . Number of children: None  . Years of education: None  . Highest education level: None  Social Needs  . Financial resource strain: None  . Food insecurity - worry: None  . Food insecurity - inability: None  . Transportation needs - medical: None  . Transportation needs - non-medical: None  Occupational History  . None  Tobacco Use  . Smoking status: Former Smoker    Years: 16.00    Types: Cigarettes    Last attempt to quit: 06/20/2010    Years since quitting: 7.4  . Smokeless tobacco: Never Used  Substance and Sexual Activity  . Alcohol use: No    Alcohol/week: 0.0 oz  . Drug use: No  . Sexual activity: Yes  Other Topics Concern  . None  Social History Narrative  . None    Allergies:  Allergies  Allergen Reactions  . No Known Allergies     Metabolic Disorder Labs: Lab Results  Component Value Date   HGBA1C 8.5 (H) 10/30/2016   MPG 197 10/30/2016   MPG 183 (H) 08/11/2011   No results found for: PROLACTIN Lab Results  Component Value Date   CHOL 200 07/09/2012   TRIG 679 (H) 07/09/2012   HDL 20 (L) 07/09/2012   VLDL SEE COMMENT 07/09/2012   LDLCALC SEE COMMENT 07/09/2012    Lab Results  Component Value Date   TSH 0.664 06/16/2013    Therapeutic Level Labs: No results found for: LITHIUM No results found for: VALPROATE No components found for:  CBMZ  Current Medications: Current Outpatient Medications  Medication Sig Dispense Refill  . albuterol-ipratropium (COMBIVENT) 18-103 MCG/ACT inhaler Inhale 2 puffs into the lungs daily as needed for wheezing or shortness of breath.     Marland Kitchen amLODipine (NORVASC) 5 MG tablet Take 1 tablet (5 mg total) by mouth 2 (two) times daily. 180 tablet 4  . atorvastatin (LIPITOR) 80 MG tablet Take 1 tablet (80 mg total) by mouth daily. 90 tablet 3  . Blood Glucose Monitoring Suppl (FREESTYLE FREEDOM LITE) W/DEVICE KIT Use 3 (three) times daily. 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 Dx Code: 250.00    . busPIRone (BUSPAR) 5 MG tablet Take 1 tablet (5 mg total) 3 (three) times daily by mouth. 270 tablet 2  . carisoprodol (SOMA) 350 MG tablet Take 1 tablet (350 mg total) by mouth every 6 (six) hours as needed for muscle spasms. 120 tablet 0  . carvedilol (COREG) 12.5 MG tablet Take 1 tablet (12.5 mg total) by mouth 2 (two) times daily with a meal. 180 tablet 3  . fluticasone (FLONASE) 50 MCG/ACT nasal spray Place 1 spray into both nostrils 2 (two) times daily as needed for allergies.    Marland Kitchen HUMULIN 70/30 KWIKPEN (70-30) 100 UNIT/ML PEN Inject 20 Units into the skin 2 (two) times daily.     Marland Kitchen HYDROcodone-acetaminophen (NORCO) 10-325 MG tablet Take 1 tablet by mouth every 6 (six) hours as needed.    Marland Kitchen lisinopril (PRINIVIL,ZESTRIL) 20 MG tablet Take 1 tablet (  20 mg total) by mouth daily. 90 tablet 3  . lithium carbonate (ESKALITH) 450 MG CR tablet Take 1 tablet (450 mg total) by mouth daily at 2 PM. 90 tablet 1  . nitroGLYCERIN (NITROSTAT) 0.4 MG SL tablet Place 1 tablet (0.4 mg total) under the tongue every 5 (five) minutes as needed for chest pain. 25 tablet 6  . omeprazole (PRILOSEC) 20 MG capsule Take 20 mg by mouth daily.     .  QUEtiapine (SEROQUEL) 25 MG tablet Take 1 tablet (25 mg total) by mouth 2 (two) times daily as needed. For severe anxiety/ agitation 45 tablet 0  . QUEtiapine (SEROQUEL) 300 MG tablet Take 1 tablet (300 mg total) at bedtime by mouth. 90 tablet 2  . QUEtiapine (SEROQUEL) 50 MG tablet Take 1 tablet (50 mg total) at bedtime by mouth. 90 tablet 2  . RESTASIS 0.05 % ophthalmic emulsion Place 1 drop into both eyes daily as needed (for dryness).     Marland Kitchen Spacer/Aero-Holding Chambers (E-Z SPACER) inhaler Reported on 01/15/2016    . Vilazodone HCl (VIIBRYD) 40 MG TABS Take 1 tablet (40 mg total) daily by mouth. 90 tablet 2  . zolpidem (AMBIEN) 5 MG tablet Take 1-2 tablets (5-10 mg total) by mouth at bedtime as needed for sleep. 60 tablet 2  . sitaGLIPtin (JANUVIA) 100 MG tablet Take 100 mg by mouth daily.     No current facility-administered medications for this visit.      Musculoskeletal: Strength & Muscle Tone: within normal limits Gait & Station: normal Patient leans: N/A  Psychiatric Specialty Exam: Review of Systems  Psychiatric/Behavioral: Positive for depression. The patient is nervous/anxious.   All other systems reviewed and are negative.   Blood pressure (!) 156/84, pulse 91, temperature 99.1 F (37.3 C), temperature source Oral, weight 167 lb 9.6 oz (76 kg).Body mass index is 29.69 kg/m.  General Appearance: Casual  Eye Contact:  Fair  Speech:  Normal Rate  Volume:  Normal  Mood:  Anxious and Dysphoric  Affect:  Tearful  Thought Process:  Goal Directed and Descriptions of Associations: Intact  Orientation:  Full (Time, Place, and Person)  Thought Content: Logical   Suicidal Thoughts:  No  Homicidal Thoughts:  No  Memory:  Immediate;   Fair Recent;   Fair Remote;   Fair  Judgement:  Fair  Insight:  Fair  Psychomotor Activity:  Normal  Concentration:  Concentration: Fair and Attention Span: Fair  Recall:  [employer] of Knowledge: Fair  Language: Fair  Akathisia:  No   Handed:  Right  AIMS (if indicated): denies tremors , rigidity  Assets:  Communication Skills Desire for Improvement Housing Intimacy Social Support  ADL's:  Intact  Cognition: WNL  Sleep:  Fair   Screenings: PHQ2-9     Office Visit from 05/09/2016 in [facility] Procedure visit from 03/20/2016 in [facility] Office Visit from 03/12/2016 in [facility] Clinical Support from 02/13/2016 in [facility] Office Visit from 12/14/2015 in [facility]  PHQ-2 Total Score  0  0  0  0  0       Assessment and Plan: Ceanna is a 62 year old Caucasian female who has a history of bipolar disorder, anxiety disorder, currently has several social stressors due to CPS involvement in her grandchildren's care.  She reports those stressors are better now since  the court hearing went in their favor.  She however continues to have periods  when she is overwhelmed.  She was unable to get lithium level as scheduled.  She reports she would be able to get it done today.  She will also reach out to Ms. Peacock for continued psychotherapy.  Plan as noted below.  Plan For bipolar disorder Continue Seroquel 350 mg p.o. nightly We will discontinue Seroquel 25 mg as needed for noncompliance. Continue lithium 450 mg p.o. daily.  We will give her another lab slip to get lithium level done.  For anxiety symptoms Continue Viibryd 40 mg p.o. Daily. Discontinue BuSpar for lack of efficacy, noncompliance.  For insomnia Continue Ambien 5-10 mg p.o. nightly as needed. Continue Seroquel 350 mg p.o. nightly.  Continue CBT with Ms. Peacock.  She will make an appointment as soon as possible.  Patient will go down to the lab today to get her lithium level drawn.  Discussed with her that writer will be able to reach  out to her once the lithium level is back.  Follow-up in 6-8 weeks or sooner if needed.  More than 50 % of the time was spent for psychoeducation and supportive psychotherapy and care coordination.  This note was generated in part or whole with voice recognition software. Voice recognition is usually quite accurate but there are transcription errors that can and very often do occur. I apologize for any typographical errors that were not detected and corrected.       Ursula Alert, MD 12/05/2017, 10:36 AM

## 2017-12-06 LAB — PROLACTIN: PROLACTIN: 6.1 ng/mL (ref 4.8–23.3)

## 2017-12-06 LAB — VITAMIN D 25 HYDROXY (VIT D DEFICIENCY, FRACTURES): Vit D, 25-Hydroxy: 9.4 ng/mL — ABNORMAL LOW (ref 30.0–100.0)

## 2017-12-07 LAB — VITAMIN B12: VITAMIN B 12: 227 pg/mL (ref 180–914)

## 2017-12-08 ENCOUNTER — Telehealth: Payer: Self-pay | Admitting: Psychiatry

## 2017-12-08 DIAGNOSIS — F3130 Bipolar disorder, current episode depressed, mild or moderate severity, unspecified: Secondary | ICD-10-CM

## 2017-12-08 MED ORDER — OXCARBAZEPINE 150 MG PO TABS: 150.0000 mg | ORAL_TABLET | Freq: Two times a day (BID) | ORAL | 1 refills | Status: DC

## 2017-12-08 NOTE — Telephone Encounter (Signed)
Called patient to discuss her labs.  TSH elevated.  Lithium level pending.  Hemoglobin A1c elevated.  Vitamin D is low.  Discussed with patient to stop lithium.  We will start a new mood stabilizer Trileptal.  This was discussed with patient during her most recent event.  Patient agrees with plan.  Also discussed with patient to follow-up with her primary medical doctor about her TSH as well as vitamin D level.  Discussed the impact of lithium on her thyroid function.  Patient however reports she did have a history of thyroid issues in the past and she does not remember if she were on lithium at that time.  Her primary medical doctor will follow up on her thyroid issues.

## 2017-12-08 NOTE — Telephone Encounter (Signed)
Pt was called and told that a copy of her labwork was faxed to her pcp and that she needs to make sure she follow up with pcp. Pt was also told that a copy of labwork would be mailed to her. Martin Majestic over the labwork with the patient also.

## 2017-12-24 ENCOUNTER — Ambulatory Visit: Admitting: Licensed Clinical Social Worker

## 2018-01-21 ENCOUNTER — Ambulatory Visit (INDEPENDENT_AMBULATORY_CARE_PROVIDER_SITE_OTHER): Admitting: Licensed Clinical Social Worker

## 2018-01-21 DIAGNOSIS — F3130 Bipolar disorder, current episode depressed, mild or moderate severity, unspecified: Secondary | ICD-10-CM | POA: Diagnosis not present

## 2018-01-23 ENCOUNTER — Ambulatory Visit (INDEPENDENT_AMBULATORY_CARE_PROVIDER_SITE_OTHER): Admitting: Psychiatry

## 2018-01-23 ENCOUNTER — Encounter: Payer: Self-pay | Admitting: Psychiatry

## 2018-01-23 ENCOUNTER — Other Ambulatory Visit: Payer: Self-pay

## 2018-01-23 VITALS — BP 161/99 | HR 81 | Temp 98.2°F | Wt 165.0 lb

## 2018-01-23 DIAGNOSIS — F5105 Insomnia due to other mental disorder: Secondary | ICD-10-CM

## 2018-01-23 DIAGNOSIS — F411 Generalized anxiety disorder: Secondary | ICD-10-CM

## 2018-01-23 DIAGNOSIS — F3130 Bipolar disorder, current episode depressed, mild or moderate severity, unspecified: Secondary | ICD-10-CM

## 2018-01-23 MED ORDER — QUETIAPINE FUMARATE 50 MG PO TABS: 50.0000 mg | ORAL_TABLET | Freq: Every day | ORAL | 2 refills | Status: DC

## 2018-01-23 MED ORDER — QUETIAPINE FUMARATE 300 MG PO TABS: 300.0000 mg | ORAL_TABLET | Freq: Every day | ORAL | 2 refills | Status: DC

## 2018-01-23 NOTE — Progress Notes (Signed)
[place] MD  OP Progress Note  01/23/2018 12:55 PM Jennifer [name]  MRN:  [id number]  Chief Complaint: ' I am sad." Chief Complaint    Follow-up; Medication Refill     HPI: Jennifer [name] is a 62 yr old Caucasian female, lives in [place], married, has a history of bipolar disorder as well as anxiety issues, presented to the clinic today for a follow-up visit.  Jennifer [name] today reports that she is currently feeling very tearful and depressed.  She reports she recently lost her pet dog.  She had the pet dog for at least 16 years.  She reports it died last month.  She reports ever since that happened she has been feeling very irritable and depressed.  She reports she has been having a lot of crying spells.  She reports she has noticed herself being irritable at her husband a lot.  She reports she was able to follow-up with Ms. Peacock recently.  She would like to schedule more appointments with her.  She however was unable to get an appointment sooner.  Patient reports she is also dealing with some medical problems right now.  She reports she was diagnosed with elevated TSH and is currently on Synthroid.  She also reports her vitamin D is currently being replaced since she had some abnormalities in her vitamin D and calcium level.  Discussed with patient that abnormal thyroid hormone levels as well as vitamin D levels can also affect her mood symptoms.  Patient was on lithium and was taken off of her lithium recently by writer because her TSH was elevated.  Patient currently is only on Seroquel and Viibryd.  She was asked to take Trileptal and the lithium was discontinued however patient reports she did not feel comfortable taking it since she did not know what it was meant for.  Provided medication education for the patient.  Discussed with patient that the Trileptal is a mood stabilizer and will take the place of lithium.  Discussed with her the dose is very low at 150 mg twice a day.  Discussed with her to take the  medication and monitor herself and let writer know if she has any worsening symptoms or side effects.  Printed out handouts about the medication and gave to patient.  Patient reports she continues to also struggle with sleep even though she is on the Seroquel and the Ambien as needed. Discussed with her to try the mood stabilizer first before making further changes.   Visit Diagnosis:    ICD-10-CM   1. Bipolar I disorder, most recent episode depressed (HCC) F31.30 QUEtiapine (SEROQUEL) 300 MG tablet    QUEtiapine (SEROQUEL) 50 MG tablet  2. GAD (generalized anxiety disorder) F41.1   3. Insomnia due to mental condition F51.05     Past Psychiatric History: Bipolar disorder, diagnosed in the 8s.  She reports that her mood symptoms started when her husband was sent away for war.  She had a manic episode then, she cheated on her husband with 2 men and also did a lot of stuff which she regrets.  She does report history of.'s when she had high energy and sleep issues.  She has history of several IP admissions for her bipolar disorder, [facility], [facility].  She reports suicide attempts x2-3 by overdose.  Patient used to follow up with Dr. Gretel Acre in the past.  Past Medical History:  Past Medical History:  Diagnosis Date  . Anemia 1985   bled during a cervical biopsy,  led to bleed transfusion   . Anxiety   . Arthritis    pt. reports that her neck is stiff   . Bipolar depression ([place])   . Blood dyscrasia    reported hx [name] '85; saw hematologist Dr. Janese Banks 10/2016, VWD work-up WNL, not felt to have a primary bleeding disorder   . CAD (coronary artery disease)   . 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 COPD (chronic obstructive pulmonary disease) ([place])    dr. Gar [facility] [place]    PCP  . Depression   . Depression with anxiety   . Diabetes mellitus   . Dyspnea    W/ EXERTION   . Enlarged liver   . Family history of adverse reaction to anesthesia     father woke up slowly  . Fibromyalgia   . Generalized anxiety disorder   . GERD (gastroesophageal reflux disease)   . Hyperlipidemia   . Hypertension   . Low back pain   . Migraines   . Neuropathy   . Persistent disorder of initiating or maintaining sleep   . Von Willebrand disease ([place])     Past Surgical History:  Procedure Laterality Date  . ABDOMINAL HYSTERECTOMY    . 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 APPLICATION OF ROBOTIC ASSISTANCE FOR SPINAL PROCEDURE N/A 12/10/2016   Procedure: APPLICATION OF ROBOTIC ASSISTANCE FOR SPINAL PROCEDURE;  Surgeon: Kevan Ny Ditty, MD;  Location: [facility];  Service: Neurosurgery;  Laterality: N/A;  . BACK SURGERY  2007   lumbar  . BREAST IMPLANT REMOVAL    . CARDIAC CATHETERIZATION     CAD,PCI of LAD, Cypher stent 2.5-28mm  . CARDIAC CATHETERIZATION     PCI of mid-LAD in stent restenosis with a drug-eluting stent  . CARDIAC CATHETERIZATION  06/03/13   [facility];MID LAD 100 % STENOSIS; PRIOR STENT; MID RCA 40 % STENOSIS  . CORONARY ARTERY BYPASS GRAFT   08-12-2011   CABG x 3  . CORONARY STENT PLACEMENT    . LUMBAR FUSION     L1-S1  . LUMBAR PERCUTANEOUS PEDICLE SCREW 1 LEVEL N/A 12/10/2016   Procedure: Extension of pedicle screw fixation to LUMBAR FOUR;  Surgeon: Kevan Ny Ditty, MD;  Location: [facility];  Service: Neurosurgery;  Laterality: N/A;  . NASAL SINUS SURGERY  1997   tuirbinate reduction     Family Psychiatric History: Mother -depression, alcoholism.  Maternal grandmother had depression.  Brother-anxiety disorder.   Brother-alcoholism  Family History:  Family History  Problem Relation Age of Onset  . Aneurysm Father        abdominal  . Diabetes Father   . Alcohol abuse Father   . Hypertension Father   . Aneurysm Brother        abdominal  . Alcohol abuse Brother   . Hypertension Brother   . Diabetes Mother   . Alcohol abuse Mother   . Hypertension Other        family hx  . Hyperlipidemia Other        family hx  . Diabetes Other  family hx   Substance abuse history: Denies  Social History: Married, lives in [place], [place] with husband.  Her son, daughter-in-law and 2 children lives with them. Social History   Socioeconomic History  . Marital status: Married    Spouse name: Photographer  . Number of children: 3  . Years of education: Not on file  . Highest education level: Associate degree: occupational, Hotel manager, or vocational program  Occupational History  . Not on file  Social Needs  . Financial resource strain: Not on file  . Food insecurity:    Worry: Not on file    Inability: Not on file  . Transportation needs:    Medical: No    Non-medical: No  Tobacco Use  . Smoking status: Former Smoker    Years: 16.00    Types: Cigarettes    Last attempt to quit: 06/20/2010    Years since quitting: 7.6  . Smokeless tobacco: Never Used  Substance and Sexual Activity  . Alcohol use: No    Alcohol/week: 0.0 oz  . Drug use: No  . Sexual activity: Yes  Lifestyle  . Physical activity:    Days per week: 0 days    Minutes per session: 0 min  . Stress: Rather much  Relationships  . Social connections:    Talks on phone: Not on file    Gets together: Not on file    Attends religious service: More than 4 times per year    Active member of club or organization: No    Attends meetings of clubs or organizations: Never    Relationship status: Married  Other Topics Concern  . Not on file  Social History Narrative  . Not on file    Allergies:  Allergies  Allergen Reactions  .  No Known Allergies     Metabolic Disorder Labs: Lab Results  Component Value Date   HGBA1C 9.4 (H) 12/05/2017   MPG 223.08 12/05/2017   MPG 197 10/30/2016   Lab Results  Component Value Date   PROLACTIN 6.1 12/05/2017   Lab Results  Component Value Date   CHOL 238 (H) 12/05/2017   TRIG 819 (H) 12/05/2017   HDL 41 12/05/2017   CHOLHDL 5.8 12/05/2017   VLDL UNABLE TO CALCULATE IF TRIGLYCERIDE OVER 400 mg/dL 12/05/2017   LDLCALC UNABLE TO CALCULATE IF TRIGLYCERIDE OVER 400 mg/dL 12/05/2017   LDLCALC SEE COMMENT 07/09/2012   Lab Results  Component Value Date   TSH 86.000 (H) 12/05/2017   TSH 0.664 06/16/2013    Therapeutic Level Labs: No results found for: LITHIUM No results found for: VALPROATE No components found for:  CBMZ  Current Medications: Current Outpatient Medications  Medication Sig Dispense Refill  . albuterol-ipratropium (COMBIVENT) 18-103 MCG/ACT inhaler Inhale 2 puffs into the lungs daily as needed for wheezing or shortness of breath.     Marland Kitchen amLODipine (NORVASC) 5 MG tablet Take 1 tablet (5 mg total) by mouth 2 (two) times daily. 180 tablet 4  . atorvastatin (LIPITOR) 80 MG tablet Take 1 tablet (80 mg total) by mouth daily. 90 tablet 3  . Blood Glucose Monitoring Suppl (FREESTYLE FREEDOM LITE) W/DEVICE KIT Use 3 (three) times daily. 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 Dx Code: 250.00    . carisoprodol (SOMA) 350 MG tablet Take 1 tablet (350 mg total) by mouth every 6 (six) hours as needed for muscle spasms. 120 tablet 0  . carvedilol (COREG) 12.5 MG tablet Take 1 tablet (12.5 mg total) by  mouth 2 (two) times daily with a meal. 180 tablet 3  . fluticasone (FLONASE) 50 MCG/ACT nasal spray Place 1 spray into both nostrils 2 (two) times daily as needed for allergies.    Marland Kitchen HUMULIN 70/30 KWIKPEN (70-30) 100 UNIT/ML PEN Inject 20 Units into the skin 2 (two) times daily.     Marland Kitchen HYDROcodone-acetaminophen (NORCO) 10-325 MG tablet Take 1 tablet by mouth every 6  (six) hours as needed.    Marland Kitchen lisinopril (PRINIVIL,ZESTRIL) 20 MG tablet Take 1 tablet (20 mg total) by mouth daily. 90 tablet 3  . nitroGLYCERIN (NITROSTAT) 0.4 MG SL tablet Place 1 tablet (0.4 mg total) under the tongue every 5 (five) minutes as needed for chest pain. 25 tablet 6  . omeprazole (PRILOSEC) 20 MG capsule Take 20 mg by mouth daily.     . OXcarbazepine (TRILEPTAL) 150 MG tablet Take 1 tablet (150 mg total) by mouth 2 (two) times daily. 60 tablet 1  . QUEtiapine (SEROQUEL) 300 MG tablet Take 1 tablet (300 mg total) by mouth at bedtime. 90 tablet 2  . QUEtiapine (SEROQUEL) 50 MG tablet Take 1 tablet (50 mg total) by mouth at bedtime. 90 tablet 2  . RESTASIS 0.05 % ophthalmic emulsion Place 1 drop into both eyes daily as needed (for dryness).     Marland Kitchen Spacer/Aero-Holding Chambers (E-Z SPACER) inhaler Reported on 01/15/2016    . Vilazodone HCl (VIIBRYD) 40 MG TABS Take 1 tablet (40 mg total) daily by mouth. 90 tablet 2  . zolpidem (AMBIEN) 5 MG tablet Take 1-2 tablets (5-10 mg total) by mouth at bedtime as needed for sleep. 60 tablet 2  . sitaGLIPtin (JANUVIA) 100 MG tablet Take 100 mg by mouth daily.     No current facility-administered medications for this visit.      Musculoskeletal: Strength & Muscle Tone: within normal limits Gait & Station: normal Patient leans: N/A  Psychiatric Specialty Exam: Review of Systems  Psychiatric/Behavioral: Positive for depression. The patient is nervous/anxious.   All other systems reviewed and are negative.   Blood pressure (!) 161/99, pulse 81, temperature 98.2 F (36.8 C), temperature source Oral, weight 165 lb (74.8 kg).Body mass index is 29.23 kg/m.  General Appearance: Casual  Eye Contact:  Fair  Speech:  Clear and Coherent  Volume:  Normal  Mood:  Anxious and Dysphoric  Affect:  Tearful  Thought Process:  Goal Directed and Descriptions of Associations: Intact  Orientation:  Full (Time, Place, and Person)  Thought Content: Logical    Suicidal Thoughts:  No  Homicidal Thoughts:  No  Memory:  Immediate;   Fair Recent;   Fair Remote;   Fair  Judgement:  Fair  Insight:  Fair  Psychomotor Activity:  Normal  Concentration:  Concentration: Fair and Attention Span: Fair  Recall:  [employer] of Knowledge: Fair  Language: Fair  Akathisia:  No  Handed:  Right  AIMS (if indicated): 0  Assets:  Communication Skills Desire for Improvement Social Support  ADL's:  Intact  Cognition: WNL  Sleep:  restless   Screenings: PHQ2-9     Office Visit from 05/09/2016 in [facility] Procedure visit from 03/20/2016 in [facility] Office Visit from 03/12/2016 in [facility] Clinical Support from 02/13/2016 in [facility] Office Visit from 12/14/2015 in [facility]  PHQ-2 Total Score  0  0  0  0  0       Assessment and Plan: Dalaney is a 62 year old Caucasian female who has a history of bipolar disorder, anxiety disorder presented today for a follow-up visit.  She recently lost her pet dog and is currently going through grief.  Patient also had some side effects to lithium and possible thyroid abnormalities and hence her mood stabilizer was discontinued.  Even though patient was started on a new mood stabilizer she did not start taking it.  Patient continues to struggle with mood lability and tearfulness today.  Discussed medication changes with patient.  She will also see Ms. Peacock for psychotherapy sooner.  Plan as noted below.  Plan For bipolar disorder Continue Seroquel 350 mg p.o. nightly Discontinued lithium due to her thyroid abnormalities. Discussed with her to start Trileptal as prescribed, patient was given 150 mg twice a day . I did medication education, provided handouts.  Anxiety symptoms Continue Viibryd 40  mg p.o. daily   For insomnia Continue Ambien 5-10 mg p.o. nightly as needed Continue Seroquel 350 mg p.o. nightly  Provided grief counseling.  Spent 10 minutes providing supportive psychotherapy.  Patient will also continue CBT with Ms. Peacock.  Discussed with Truman Hayward to schedule her sooner with Ms. Peacock.  Follow-up in clinic in 2 weeks or sooner if needed.  More than 50 % of the time was spent for psychoeducation and supportive psychotherapy and care coordination.  This note was generated in part or whole with voice recognition software. Voice recognition is usually quite accurate but there are transcription errors that can and very often do occur. I apologize for any typographical errors that were not detected and corrected.       Ursula Alert, MD 01/23/2018, 12:55 PM

## 2018-01-23 NOTE — Patient Instructions (Signed)
Oxcarbazepine tablets What is this medicine? OXCARBAZEPINE (ox car BAZ e peen) is used to treat people with epilepsy. It helps prevent partial seizures. This medicine may be used for other purposes; ask your health care provider or pharmacist if you have questions. COMMON BRAND NAME(S): Trileptal What should I tell my health care provider before I take this medicine? They need to know if you have any of these conditions: -Asian ancestry -kidney disease -liver disease -suicidal thoughts, plans, or attempt; a previous suicide attempt by you or a family member -any unusual or allergic reaction to oxcarbazepine, carbamazepine, other medicines, foods, dyes, or preservatives -pregnant or trying to get pregnant -breast-feeding How should I use this medicine? Take this medicine by mouth with a glass of water. Follow the directions on the prescription label. This medicine may be taken with or without food. Take your doses at regular intervals. Do not take your medicine more often than directed. Do not stop taking except on the advice of your doctor or health care professional. A special MedGuide will be given to you by the pharmacist with each prescription and refill. Be sure to read this information carefully each time. Talk to your pediatrician regarding the use of this medicine in children. While this medicine may be prescribed for children as young as 2 years for selected conditions, precautions do apply. Overdosage: If you think you have taken too much of this medicine contact a poison control center or emergency room at once. NOTE: This medicine is only for you. Do not share this medicine with others. What if I miss a dose? If you miss a dose, take it as soon as you can. If it is almost time for your next dose, take only that dose. Do not take double or extra doses. What may interact with this medicine? Do not take this medicine with any of the following medications: -carbamazepine This  medicine may also interact with the following medications: -birth control pills -certain medicines for seizures like phenobarbital, phenytoin, valproic acid -certain medicines for high blood pressure like felodipine, diltiazem, verapamil -cyclosporine This list may not describe all possible interactions. Give your health care provider a list of all the medicines, herbs, non-prescription drugs, or dietary supplements you use. Also tell them if you smoke, drink alcohol, or use illegal drugs. Some items may interact with your medicine. What should I watch for while using this medicine? Visit your doctor or health care professional for regular checks on your progress. Do not stop taking this medicine suddenly. This increases the risk of seizures. Wear a Medic Alert bracelet or necklace. Carry an identification card with information about your condition, medications, and doctor or health care professional. Rarely, serious skin allergic reactions may occur with this medicine. If you develop a skin rash, redness, itching, peeling skin inside your mouth, swollen glands, or a fever while taking this medicine, contact your health care provider immediately. You may get drowsy or dizzy. Do not drive, use machinery, or do anything that needs mental alertness until you know how this drug affects you. Do not stand or sit up quickly, especially if you are an older patient. This reduces the risk of dizzy or fainting spells. Alcohol can make you more drowsy and dizzy. Avoid alcoholic drinks. Birth control pills may not work properly while you are taking this medicine. Talk to your doctor about using an extra method of birth control. The use of this medicine may increase the chance of suicidal thoughts or actions. Pay special attention   to how you are responding while on this medicine. Any worsening of mood, or thoughts of suicide or dying should be reported to your health care professional right away. Women who become  pregnant while using this medicine may enroll in the [place] Antiepileptic Drug Pregnancy Registry by calling 1-888-233-2334. This registry collects information about the safety of antiepileptic drug use during pregnancy. What side effects may I notice from receiving this medicine? Side effects that you should report to your doctor or health care professional as soon as possible: -allergic reactions such as skin rash or itching, hives, swelling of the lips, mouth, tongue, or throat -changes in vision -confusion -difficulty passing urine or change in the amount of urine -fever -infection -nausea, vomiting -problems with balance, speaking, walking -redness, blistering, peeling or loosening of the skin, including inside the mouth -swelling of feet, hands -unusual bleeding, bruising -unusually weak or tired -worsening of mood, thoughts or actions of suicide or dying -yellowing of eyes, skin Side effects that usually do not require medical attention (report to your doctor or health care professional if they continue or are bothersome): -constipation or diarrhea -headache -loss of appetite -nervous -stomach upset -tremors -trouble sleeping This list may not describe all possible side effects. Call your doctor for medical advice about side effects. You may report side effects to FDA at 1-800-FDA-1088. Where should I keep my medicine? Keep out of reach of children. Store at room temperature between 15 and 30 degrees C (59 and 86 degrees F). Keep container tightly closed. Throw away any unused medicine after the expiration date. NOTE: This sheet is a summary. It may not cover all possible information. If you have questions about this medicine, talk to your doctor, pharmacist, or health care provider.  2018 Elsevier/Gold Standard (2013-04-14 15:09:57)  

## 2018-02-05 ENCOUNTER — Ambulatory Visit (INDEPENDENT_AMBULATORY_CARE_PROVIDER_SITE_OTHER): Admitting: Psychiatry

## 2018-02-05 ENCOUNTER — Other Ambulatory Visit: Payer: Self-pay

## 2018-02-05 ENCOUNTER — Encounter: Payer: Self-pay | Admitting: Psychiatry

## 2018-02-05 VITALS — BP 162/85 | HR 90 | Temp 98.9°F | Wt 163.8 lb

## 2018-02-05 DIAGNOSIS — F5105 Insomnia due to other mental disorder: Secondary | ICD-10-CM

## 2018-02-05 DIAGNOSIS — F3130 Bipolar disorder, current episode depressed, mild or moderate severity, unspecified: Secondary | ICD-10-CM | POA: Diagnosis not present

## 2018-02-05 MED ORDER — OXCARBAZEPINE 300 MG PO TABS: 300.0000 mg | ORAL_TABLET | Freq: Two times a day (BID) | ORAL | 1 refills | Status: DC

## 2018-02-05 MED ORDER — ZOLPIDEM TARTRATE 5 MG PO TABS: 5.0000 mg | ORAL_TABLET | Freq: Every evening | ORAL | 2 refills | Status: DC | PRN

## 2018-02-05 NOTE — Progress Notes (Signed)
[facility] MD OP Progress Note  02/05/2018 9:11 PM Jennifer [name]  MRN:  [id number]  Chief Complaint: ' I am less anxious." Chief Complaint    Follow-up; Medication Refill     HPI: Jennifer [name] is a 62 year old Caucasian female, lives in [place], married, has a history of bipolar disorder, anxiety issues, presented to the clinic today for a follow-up visit.  Jennifer [name] today reports her mood as improving.  She started taking the Trileptal and that has been helpful.  She reports she would like her dosage to be increased today.  She continues to be sad about her dog that passed away.  She however reports her crying spells have improved.  She also reports some mood lability and irritability however has been able to distract herself as well as walk away from her situations that triggers it.  Patient also has been working with Ms. Peacock which she reports has been helpful.  Reports she has an endocrinology appointment scheduled for her elevated TSH.  She was asked to stop the lithium last visit since she had a TSH abnormality.  Patient continues to be compliant with her other medications.  She denies any side effects.  Patient reports sleep as good on the current medication scheduled.  She denies any other concerns today  Visit Diagnosis:    ICD-10-CM   1. Bipolar I disorder, most recent episode depressed ([place]) F31.30   2. Insomnia due to mental disorder F51.05 zolpidem (AMBIEN) 5 MG tablet    Past Psychiatric History: Bipolar disorder, diagnosed in the 43s.  She reports that her mood symptoms started when her husband was sent away for war.  She had a manic episode then.  She cheated on her husband with 2 men and also did a lot of stuff which she regrets.  She does report history of having high energy as well as sleep issues in the past.  She has history of several IP admissions for bipolar disorder, [facility], [facility].  She reports suicide attempts x2-3 by overdose.  Patient used to follow up with Dr.  Gretel Acre in the past.  Past Medical History:  Past Medical History:  Diagnosis Date  . Anemia 1985   bled during a cervical biopsy, led to bleed transfusion   . Anxiety   . Arthritis    pt. reports that her neck is stiff   . Bipolar depression ([place])   . Blood dyscrasia    reported hx [name] '85; saw hematologist Dr. Janese Banks 10/2016, VWD work-up WNL, not felt to have a primary bleeding disorder   . CAD (coronary artery disease)   . 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 COPD (chronic obstructive pulmonary disease) ([place])    dr. Gar [facility]    PCP  . Depression   . Depression with anxiety   . Diabetes mellitus   . Dyspnea    W/ EXERTION   . Enlarged liver   . Family history of adverse reaction to anesthesia    father woke up slowly  . Fibromyalgia   . Generalized anxiety disorder   . GERD (gastroesophageal reflux disease)   . Hyperlipidemia   . Hypertension   . Low back pain   . Migraines   . Neuropathy   . Persistent disorder of initiating or maintaining sleep   . Von Willebrand disease ([place])     Past Surgical History:  Procedure Laterality Date  .  ABDOMINAL HYSTERECTOMY    . 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 APPLICATION OF ROBOTIC ASSISTANCE FOR SPINAL PROCEDURE N/A 12/10/2016   Procedure: APPLICATION OF ROBOTIC ASSISTANCE FOR SPINAL PROCEDURE;  Surgeon: Kevan Ny Ditty, MD;  Location: [facility];  Service: Neurosurgery;  Laterality: N/A;  . BACK SURGERY  2007   lumbar  . BREAST IMPLANT REMOVAL    . CARDIAC  CATHETERIZATION     CAD,PCI of LAD, Cypher stent 2.5-28mm  . CARDIAC CATHETERIZATION     PCI of mid-LAD in stent restenosis with a drug-eluting stent  . CARDIAC CATHETERIZATION  06/03/13   [facility];MID LAD 100 % STENOSIS; PRIOR STENT; MID RCA 40 % STENOSIS  . CORONARY ARTERY BYPASS GRAFT   08-12-2011   CABG x 3  . CORONARY STENT PLACEMENT    . LUMBAR FUSION     L1-S1  . LUMBAR PERCUTANEOUS PEDICLE SCREW 1 LEVEL N/A 12/10/2016   Procedure: Extension of pedicle screw fixation to LUMBAR FOUR;  Surgeon: Kevan Ny Ditty, MD;  Location: [facility];  Service: Neurosurgery;  Laterality: N/A;  . NASAL SINUS SURGERY  1997   tuirbinate reduction     Family Psychiatric History:Mother-Depression, alcoholism.  Maternal grandmother had depression.  Brother-anxiety disorder.  Brother-alcoholism.  Family History:  Family History  Problem Relation Age of Onset  . Aneurysm Father        abdominal  . Diabetes Father   . Alcohol abuse Father   . Hypertension Father   . Aneurysm Brother        abdominal  . Alcohol abuse Brother   . Hypertension Brother   . Diabetes Mother   . Alcohol abuse Mother   . Hypertension Other        family hx  . Hyperlipidemia Other        family hx  . Diabetes Other        family hx   Substance abuse history: Denies  Social History: Married, lives in [place], [place] with husband.  Her son, daughter-in-law and 2 children lives with them. Social History   Socioeconomic History  . Marital status: Married    Spouse name: Photographer  . Number of children: 3  . Years of education: Not on file  . Highest education level: Associate degree: occupational, Hotel manager, or vocational program  Occupational History  . Not on file  Social Needs  . Financial resource strain: Not on file  . Food insecurity:    Worry: Not on file    Inability: Not on file  . Transportation needs:    Medical: No    Non-medical: No  Tobacco Use  . Smoking status: Former Smoker    Years: 16.00     Types: Cigarettes    Last attempt to quit: 06/20/2010    Years since quitting: 7.6  . Smokeless tobacco: Never Used  Substance and Sexual Activity  . Alcohol use: No    Alcohol/week: 0.0 oz  . Drug use: No  . Sexual activity: Yes  Lifestyle  . Physical activity:    Days per week: 0 days    Minutes per session: 0 min  . Stress: Rather  much  Relationships  . Social connections:    Talks on phone: Not on file    Gets together: Not on file    Attends religious service: More than 4 times per year    Active member of club or organization: No    Attends meetings of clubs or organizations: Never    Relationship status: Married  Other Topics Concern  . Not on file  Social History Narrative  . Not on file    Allergies:  Allergies  Allergen Reactions  . No Known Allergies     Metabolic Disorder Labs: Lab Results  Component Value Date   HGBA1C 9.4 (H) 12/05/2017   MPG 223.08 12/05/2017   MPG 197 10/30/2016   Lab Results  Component Value Date   PROLACTIN 6.1 12/05/2017   Lab Results  Component Value Date   CHOL 238 (H) 12/05/2017   TRIG 819 (H) 12/05/2017   HDL 41 12/05/2017   CHOLHDL 5.8 12/05/2017   VLDL UNABLE TO CALCULATE IF TRIGLYCERIDE OVER 400 mg/dL 12/05/2017   LDLCALC UNABLE TO CALCULATE IF TRIGLYCERIDE OVER 400 mg/dL 12/05/2017   LDLCALC SEE COMMENT 07/09/2012   Lab Results  Component Value Date   TSH 86.000 (H) 12/05/2017   TSH 0.664 06/16/2013    Therapeutic Level Labs: No results found for: LITHIUM No results found for: VALPROATE No components found for:  CBMZ  Current Medications: Current Outpatient Medications  Medication Sig Dispense Refill  . albuterol-ipratropium (COMBIVENT) 18-103 MCG/ACT inhaler Inhale 2 puffs into the lungs daily as needed for wheezing or shortness of breath.     Marland Kitchen amLODipine (NORVASC) 5 MG tablet Take 1 tablet (5 mg total) by mouth 2 (two) times daily. 180 tablet 4  . atorvastatin (LIPITOR) 80 MG tablet Take 1 tablet (80  mg total) by mouth daily. 90 tablet 3  . Blood Glucose Monitoring Suppl (FREESTYLE FREEDOM LITE) W/DEVICE KIT Use 3 (three) times daily. 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 Dx Code: 250.00    . carisoprodol (SOMA) 350 MG tablet Take 1 tablet (350 mg total) by mouth every 6 (six) hours as needed for muscle spasms. 120 tablet 0  . carvedilol (COREG) 12.5 MG tablet Take 1 tablet (12.5 mg total) by mouth 2 (two) times daily with a meal. 180 tablet 3  . fluticasone (FLONASE) 50 MCG/ACT nasal spray Place 1 spray into both nostrils 2 (two) times daily as needed for allergies.    Marland Kitchen HUMULIN 70/30 KWIKPEN (70-30) 100 UNIT/ML PEN Inject 20 Units into the skin 2 (two) times daily.     Marland Kitchen HYDROcodone-acetaminophen (NORCO) 10-325 MG tablet Take 1 tablet by mouth every 6 (six) hours as needed.    Marland Kitchen lisinopril (PRINIVIL,ZESTRIL) 20 MG tablet Take 1 tablet (20 mg total) by mouth daily. 90 tablet 3  . nitroGLYCERIN (NITROSTAT) 0.4 MG SL tablet Place 1 tablet (0.4 mg total) under the tongue every 5 (five) minutes as needed for chest pain. 25 tablet 6  . omeprazole (PRILOSEC) 20 MG capsule Take 20 mg by mouth daily.     . Oxcarbazepine (TRILEPTAL) 300 MG tablet Take 1 tablet (300 mg total) by mouth 2 (two) times daily. 180 tablet 1  . QUEtiapine (SEROQUEL) 300 MG tablet Take 1 tablet (300 mg total) by mouth at bedtime. 90 tablet 2  . QUEtiapine (SEROQUEL) 50 MG tablet Take 1 tablet (50 mg total) by mouth at bedtime. 90 tablet 2  . RESTASIS 0.05 % ophthalmic emulsion Place 1 drop into both  eyes daily as needed (for dryness).     . sitaGLIPtin (JANUVIA) 100 MG tablet Take 100 mg by mouth daily.    Marland Kitchen Spacer/Aero-Holding Chambers (E-Z SPACER) inhaler Reported on 01/15/2016    . Vilazodone HCl (VIIBRYD) 40 MG TABS Take 1 tablet (40 mg total) daily by mouth. 90 tablet 2  . [START ON 02/27/2018] zolpidem (AMBIEN) 5 MG tablet Take 1-2 tablets (5-10 mg total) by mouth at bedtime as needed for sleep. 60 tablet 2   No  current facility-administered medications for this visit.      Musculoskeletal: Strength & Muscle Tone: within normal limits Gait & Station: normal Patient leans: N/A  Psychiatric Specialty Exam: Review of Systems  Psychiatric/Behavioral: Positive for depression. The patient is nervous/anxious.   All other systems reviewed and are negative.   Blood pressure (!) 162/85, pulse 90, temperature 98.9 F (37.2 C), temperature source Oral, weight 163 lb 12.8 oz (74.3 kg).Body mass index is 29.02 kg/m.  General Appearance: Casual  Eye Contact:  Fair  Speech:  Normal Rate  Volume:  Normal  Mood:  Dysphoric  Affect:  Congruent  Thought Process:  Goal Directed and Descriptions of Associations: Intact  Orientation:  Full (Time, Place, and Person)  Thought Content: Logical   Suicidal Thoughts:  No  Homicidal Thoughts:  No  Memory:  Immediate;   Fair Recent;   Fair Remote;   Fair  Judgement:  Fair  Insight:  Fair  Psychomotor Activity:  Normal  Concentration:  Concentration: Fair and Attention Span: Fair  Recall:  [employer] of Knowledge: Fair  Language: Fair  Akathisia:  No  Handed:  Right  AIMS (if indicated): 0  Assets:  Communication Skills Desire for [place] Talents/Skills Transportation  ADL's:  Intact  Cognition: WNL  Sleep:  Fair   Screenings: PHQ2-9     Office Visit from 05/09/2016 in [facility] Procedure visit from 03/20/2016 in [facility] Office Visit from 03/12/2016 in [facility] Clinical Support from 02/13/2016 in [facility] Office Visit from 12/14/2015 in [facility]  PHQ-2 Total Score  0  0  0  0  0       Assessment and Plan: Trinitee is a 62 year old Caucasian female who has a history of bipolar disorder, anxiety  disorder presented to the clinic today for a follow-up visit.  Patient is making progress on the current medication regimen.Discussed increasing her dose.  She is also in psychotherapy.  Plan as noted below.  Plan For bipolar disorder-improving Continue Seroquel 350 mg p.o. nightly Increase Trileptal to 300 mg p.o. twice daily Provided medication education, provided handouts.  Anxiety symptoms Continue Viibryd 40 mg p.o. daily  For insomnia Continue Ambien 5-10 mg p.o. nightly as needed Continue Seroquel 350 mg p.o. nightly  Patient will continue grief counseling, psychotherapy with Ms. Peacock.  Follow-up in clinic in 1 month or sooner if needed.  More than 50 % of the time was spent for psychoeducation and supportive psychotherapy and care coordination.  This note was generated in part or whole with voice recognition software. Voice recognition is usually quite accurate but there are transcription errors that can and very often do occur. I apologize for any typographical errors that were not detected and corrected.       Ursula Alert, MD 02/05/2018, 9:11 PM

## 2018-02-17 NOTE — Progress Notes (Signed)
  THERAPIST PROGRESS NOTE   Date of Service:   01/21/2018  Session Time:   1 hour  Patient:   Jennifer [name]   DOB:   06/21/1956  MR Number:  [id number]  Location:  [facility] [address] [place] [address] Dept: [phone]            Provider/Observer:  Lubertha South Counselor  Risk of Suicide/Violence: virtually non-existent   Diagnosis:    Bipolar disorder, most recent episode depressed ([place])  Type of Therapy: Individual Therapy  Treatment Goals addressed: Coping  Participation Level: Active    Behavioral Observation: Pattie Flaharty Heckert  presents as a 62 y.o.-year-old  Interventions: CBT and Motivational Interviewing   Behavioral Response: CasualAlertEuthymic   Summary: Therapist introduced Patient to Practical Facts About Mental Illnesses from and thoroughly presented Patient with information regarding her diagnosis.  Therapist then assisted Patient with understanding coping skills, what they are and how they can help.  Therapist complied a list with Patient and allowed her to choose. Therapist defined each coping skill and modeled how to utilize.  Therapist gave Patient homework of practicing the coping skills to reduce symptoms over the next week.     Plan: Enisa Runyan Marchitto will use coping skills   Return again in 2 weeks.

## 2018-02-18 ENCOUNTER — Ambulatory Visit: Admitting: Licensed Clinical Social Worker

## 2018-03-09 ENCOUNTER — Telehealth: Payer: Self-pay

## 2018-03-09 DIAGNOSIS — F5105 Insomnia due to other mental disorder: Secondary | ICD-10-CM

## 2018-03-09 MED ORDER — ZOLPIDEM TARTRATE 5 MG PO TABS: 5.0000 mg | ORAL_TABLET | Freq: Every evening | ORAL | 1 refills | Status: DC | PRN

## 2018-03-09 NOTE — Telephone Encounter (Signed)
Sent ambien 90 days supply to pharmacy

## 2018-03-09 NOTE — Telephone Encounter (Signed)
pt called states she left a message earlier. states that the mail order pharmacy would not hold rx for ambien because she put due not fill until may 3rd and they would not hold so they need a new rx and pt states that she can get a 90 day supply same price as 30

## 2018-03-09 NOTE — Telephone Encounter (Signed)
pt called left [facility] for ambien until 5 3-19 they need a new rx for the ambien and pt can do a 90 day supply same cost as 30 day.

## 2018-03-26 ENCOUNTER — Encounter: Payer: Self-pay | Admitting: Psychiatry

## 2018-03-26 ENCOUNTER — Other Ambulatory Visit: Payer: Self-pay

## 2018-03-26 ENCOUNTER — Ambulatory Visit (INDEPENDENT_AMBULATORY_CARE_PROVIDER_SITE_OTHER): Admitting: Psychiatry

## 2018-03-26 VITALS — BP 161/89 | HR 105 | Temp 98.8°F | Wt 162.0 lb

## 2018-03-26 DIAGNOSIS — F411 Generalized anxiety disorder: Secondary | ICD-10-CM | POA: Diagnosis not present

## 2018-03-26 DIAGNOSIS — F3130 Bipolar disorder, current episode depressed, mild or moderate severity, unspecified: Secondary | ICD-10-CM

## 2018-03-26 DIAGNOSIS — F5105 Insomnia due to other mental disorder: Secondary | ICD-10-CM | POA: Diagnosis not present

## 2018-03-26 NOTE — Progress Notes (Signed)
[facility] MD OP Progress Note  03/26/2018 1:21 PM Jennifer [name]  MRN:  [id number]  Chief Complaint: ' I am here for follow up.' Chief Complaint    Follow-up; Medication Refill     HPI: Jennifer [name] is a 62 yr old is a caucasian female , lives in [place] , married, has a hx of bipolar disorder , anxiety issues , presented to the clinic for a visit.  Patient today reports she continues to have several psychosocial stressors.  She reports her daughter-in-law relapsed on drugs.  She hence is out of the house.  She reports she gets sad when she sees the grand children ask for their mother .  Patient reports her son continues to take care of them and lives with her and her husband.  Patient became very tearful when she discussed her daughter-in-law's substance abuse problems.  Some time was spent reassuring patient.  Discussed her medications with patient.  Patient reports she is okay with the medication dosage except for her recent crying spells and tearfulness.  She reports she has only been taking Trileptal 300 mg once a day.  Discussed with her she was prescribed 300 mg twice a day.  Discussed with patient to take both the doses at bedtime or give few hours time in between.  She agrees with plan.  Patient reports sleep is okay.  Patient denies any suicidality.  Patient denies any perceptual disturbances.  She with elevated blood pressure today.  Discussed with patient.  She reports she skipped her blood pressure medication this morning.  Provided medication education and the need for being compliant on medications. Visit Diagnosis:    ICD-10-CM   1. Bipolar I disorder, most recent episode depressed ([place]) F31.30   2. Insomnia due to mental disorder F51.05   3. GAD (generalized anxiety disorder) F41.1     Past Psychiatric History: I have reviewed past psychiatric history from my progress note on 02/05/2018.  Past Medical History:  Past Medical History:  Diagnosis Date  . Anemia 1985   bled during  a cervical biopsy, led to bleed transfusion   . Anxiety   . Arthritis    pt. reports that her neck is stiff   . Bipolar depression ([place])   . Blood dyscrasia    reported hx [name] '85; saw hematologist Dr. Janese Banks 10/2016, VWD work-up WNL, not felt to have a primary bleeding disorder   . CAD (coronary artery disease)   . 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 COPD (chronic obstructive pulmonary disease) ([place])    dr. Gar [facility]    PCP  . Depression   . Depression with anxiety   . Diabetes mellitus   . Dyspnea    W/ EXERTION   . Enlarged liver   . Family history of adverse reaction to anesthesia    father woke up slowly  . Fibromyalgia   . Generalized anxiety disorder   . GERD (gastroesophageal reflux disease)   . Hyperlipidemia   . Hypertension   . Low back pain   . Migraines   . Neuropathy   . Persistent disorder of initiating or maintaining sleep   . Von Willebrand disease ([place])     Past Surgical History:  Procedure Laterality Date  . ABDOMINAL HYSTERECTOMY    . 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 APPLICATION OF ROBOTIC ASSISTANCE FOR SPINAL PROCEDURE N/A 12/10/2016   Procedure: APPLICATION OF ROBOTIC ASSISTANCE FOR SPINAL PROCEDURE;  Surgeon: Kevan Ny Ditty, MD;  Location: [facility];  Service: Neurosurgery;  Laterality: N/A;  . BACK SURGERY  2007   lumbar  . BREAST IMPLANT REMOVAL    . CARDIAC CATHETERIZATION     CAD,PCI of LAD, Cypher stent 2.5-28mm  . CARDIAC CATHETERIZATION     PCI of mid-LAD in stent restenosis  with a drug-eluting stent  . CARDIAC CATHETERIZATION  06/03/13   [facility];MID LAD 100 % STENOSIS; PRIOR STENT; MID RCA 40 % STENOSIS  . CORONARY ARTERY BYPASS GRAFT   08-12-2011   CABG x 3  . CORONARY STENT PLACEMENT    . LUMBAR FUSION     L1-S1  . LUMBAR PERCUTANEOUS PEDICLE SCREW 1 LEVEL N/A 12/10/2016   Procedure: Extension of pedicle screw fixation to LUMBAR FOUR;  Surgeon: Kevan Ny Ditty, MD;  Location: [facility];  Service: Neurosurgery;  Laterality: N/A;  . NASAL SINUS SURGERY  1997   tuirbinate reduction     Family Psychiatric History: Reviewed family psychiatric history from my progress note on 02/05/2018  Family History:  Family History  Problem Relation Age of Onset  . Aneurysm Father        abdominal  . Diabetes Father   . Alcohol abuse Father   . Hypertension Father   . Aneurysm Brother        abdominal  . Alcohol abuse Brother   . Hypertension Brother   . Diabetes Mother   . Alcohol abuse Mother   . Hypertension Other        family hx  . Hyperlipidemia Other        family hx  . Diabetes Other        family hx  Substance abuse history: Denies   Social History: Reviewed social history from my progress note on 02/05/2018 Social History   Socioeconomic History  . Marital status: Married    Spouse name: Photographer  . Number of children: 3  . Years of education: Not on file  . Highest education level: Associate degree: occupational, Hotel manager, or vocational program  Occupational History  . Not on file  Social Needs  . Financial resource strain: Not on file  . Food insecurity:    Worry: Not on file    Inability: Not on file  . Transportation needs:    Medical: No    Non-medical: No  Tobacco Use  . Smoking status: Former Smoker    Years: 16.00    Types: Cigarettes    Last attempt to quit: 06/20/2010    Years since quitting: 7.7  . Smokeless tobacco: Never Used  Substance and Sexual Activity  . Alcohol use: No    Alcohol/week: 0.0 oz  . Drug use: No  .  Sexual activity: Yes  Lifestyle  . Physical activity:    Days per week: 0 days    Minutes per session: 0 min  . Stress: Rather much  Relationships  . Social connections:    Talks on phone: Not on file    Gets together: Not on file    Attends religious service: More than 4 times per  year    Active member of club or organization: No    Attends meetings of clubs or organizations: Never    Relationship status: Married  Other Topics Concern  . Not on file  Social History Narrative  . Not on file    Allergies:  Allergies  Allergen Reactions  . No Known Allergies     Metabolic Disorder Labs: Lab Results  Component Value Date   HGBA1C 9.4 (H) 12/05/2017   MPG 223.08 12/05/2017   MPG 197 10/30/2016   Lab Results  Component Value Date   PROLACTIN 6.1 12/05/2017   Lab Results  Component Value Date   CHOL 238 (H) 12/05/2017   TRIG 819 (H) 12/05/2017   HDL 41 12/05/2017   CHOLHDL 5.8 12/05/2017   VLDL UNABLE TO CALCULATE IF TRIGLYCERIDE OVER 400 mg/dL 12/05/2017   LDLCALC UNABLE TO CALCULATE IF TRIGLYCERIDE OVER 400 mg/dL 12/05/2017   LDLCALC SEE COMMENT 07/09/2012   Lab Results  Component Value Date   TSH 86.000 (H) 12/05/2017   TSH 0.664 06/16/2013    Therapeutic Level Labs: No results found for: LITHIUM No results found for: VALPROATE No components found for:  CBMZ  Current Medications: Current Outpatient Medications  Medication Sig Dispense Refill  . albuterol-ipratropium (COMBIVENT) 18-103 MCG/ACT inhaler Inhale 2 puffs into the lungs daily as needed for wheezing or shortness of breath.     Marland Kitchen amLODipine (NORVASC) 5 MG tablet Take 1 tablet (5 mg total) by mouth 2 (two) times daily. 180 tablet 4  . atorvastatin (LIPITOR) 80 MG tablet Take 1 tablet (80 mg total) by mouth daily. 90 tablet 3  . Blood Glucose Monitoring Suppl (FREESTYLE FREEDOM LITE) W/DEVICE KIT Use 3 (three) times daily. 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 Dx Code: 250.00    . carisoprodol  (SOMA) 350 MG tablet Take 1 tablet (350 mg total) by mouth every 6 (six) hours as needed for muscle spasms. 120 tablet 0  . carvedilol (COREG) 12.5 MG tablet Take 1 tablet (12.5 mg total) by mouth 2 (two) times daily with a meal. 180 tablet 3  . fluticasone (FLONASE) 50 MCG/ACT nasal spray Place 1 spray into both nostrils 2 (two) times daily as needed for allergies.    Marland Kitchen glucose blood (FREESTYLE LITE) test strip Use 3 (three) times daily    . HUMULIN 70/30 KWIKPEN (70-30) 100 UNIT/ML PEN Inject 20 Units into the skin 2 (two) times daily.     Marland Kitchen HYDROcodone-acetaminophen (NORCO) 10-325 MG tablet Take 1 tablet by mouth every 6 (six) hours as needed.    Marland Kitchen lisinopril (PRINIVIL,ZESTRIL) 20 MG tablet Take 1 tablet (20 mg total) by mouth daily. 90 tablet 3  . metFORMIN (GLUCOPHAGE-XR) 750 MG 24 hr tablet     . nitroGLYCERIN (NITROSTAT) 0.4 MG SL tablet Place 1 tablet (0.4 mg total) under the tongue every 5 (five) minutes as needed for chest pain. 25 tablet 6  . omeprazole (PRILOSEC) 20 MG capsule Take 20 mg by mouth daily.     . Oxcarbazepine (TRILEPTAL) 300 MG tablet Take 1 tablet (300 mg total) by mouth 2 (two) times daily. 180 tablet 1  . QUEtiapine (SEROQUEL) 300 MG tablet Take 1 tablet (300 mg total) by mouth at bedtime. 90 tablet 2  . QUEtiapine (SEROQUEL) 50 MG tablet Take 1 tablet (50 mg total) by mouth at bedtime. 90 tablet 2  . RESTASIS 0.05 % ophthalmic emulsion Place 1 drop into both eyes daily as needed (for dryness).     Marland Kitchen  rOPINIRole (REQUIP) 0.5 MG tablet Take by mouth.    . Spacer/Aero-Holding Chambers (E-Z SPACER) inhaler Reported on 01/15/2016    . SYNTHROID 25 MCG tablet     . Vilazodone HCl (VIIBRYD) 40 MG TABS Take 1 tablet (40 mg total) daily by mouth. 90 tablet 2  . zolpidem (AMBIEN) 5 MG tablet Take 1-2 tablets (5-10 mg total) by mouth at bedtime as needed for sleep. 90 tablet 1  . sitaGLIPtin (JANUVIA) 100 MG tablet Take 100 mg by mouth daily.     No current facility-administered  medications for this visit.      Musculoskeletal: Strength & Muscle Tone: within normal limits Gait & Station: normal Patient leans: N/A  Psychiatric Specialty Exam: Review of Systems  Psychiatric/Behavioral: Positive for depression. The patient is nervous/anxious.   All other systems reviewed and are negative.   Blood pressure (!) 161/89, pulse (!) 105, temperature 98.8 F (37.1 C), temperature source Oral, weight 162 lb (73.5 kg).Body mass index is 28.7 kg/m.  General Appearance: Casual  Eye Contact:  Fair  Speech:  Clear and Coherent  Volume:  Normal  Mood:  Anxious and Dysphoric  Affect:  Congruent  Thought Process:  Goal Directed and Descriptions of Associations: Intact  Orientation:  Full (Time, Place, and Person)  Thought Content: Logical   Suicidal Thoughts:  No  Homicidal Thoughts:  No  Memory:  Immediate;   Fair Recent;   Fair Remote;   Fair  Judgement:  Fair  Insight:  Fair  Psychomotor Activity:  Normal  Concentration:  Concentration: Fair and Attention Span: Fair  Recall:  [employer] of Knowledge: Fair  Language: Fair  Akathisia:  No  Handed:  Right  AIMS (if indicated):0  Assets:  Communication Skills Desire for Improvement Social Support  ADL's:  Intact  Cognition: WNL  Sleep:  Fair   Screenings: PHQ2-9     Office Visit from 05/09/2016 in [facility] Procedure visit from 03/20/2016 in [facility] Office Visit from 03/12/2016 in [facility] Clinical Support from 02/13/2016 in [facility] Office Visit from 12/14/2015 in [facility]  PHQ-2 Total Score  0  0  0  0  0       Assessment and Plan: Karina is a 62 year old Caucasian female who has a history of bipolar disorder, anxiety disorder, presented to the clinic today for a follow-up  visit.  Patient continues to have psychosocial stressors of her grandchildren living with her, her daughter relapsing on drugs and so on.  Discussed pursuing psychotherapy may be on a more intensive basis.  Discussed medication changes as noted below.  Plan For bipolar disorder Continue Trileptal 300 mg p.o. twice daily.  Patient was taking it only once a day even though the dosage was increased last visit.  Discussed with patient to increase the dose. Continue Seroquel 350 mg p.o. nightly  For anxiety symptoms Continue Viibryd 40 mg p.o. daily  For insomnia Continue Ambien 5-10 mg p.o. nightly as needed Continue Seroquel 350 mg p.o. Nightly.  Discussed elevated blood pressure with pt today-patient reports she skipped her blood pressure medication this morning.  Patient provided with education about medication compliance.   Patient will continue psychotherapy with Ms. Peacock.  Follow-up in clinic in 4 weeks or sooner if needed.  More than 50 % of the time was spent for psychoeducation and  supportive psychotherapy and care coordination.  This note was generated in part or whole with voice recognition software. Voice recognition is usually quite accurate but there are transcription errors that can and very often do occur. I apologize for any typographical errors that were not detected and corrected.       Ursula Alert, MD 03/27/2018, 11:17 AM

## 2018-03-27 ENCOUNTER — Encounter: Payer: Self-pay | Admitting: Psychiatry

## 2018-04-14 ENCOUNTER — Ambulatory Visit: Admitting: Licensed Clinical Social Worker

## 2018-04-23 ENCOUNTER — Encounter: Payer: Self-pay | Admitting: Psychiatry

## 2018-04-23 ENCOUNTER — Other Ambulatory Visit: Payer: Self-pay

## 2018-04-23 ENCOUNTER — Ambulatory Visit (INDEPENDENT_AMBULATORY_CARE_PROVIDER_SITE_OTHER): Admitting: Psychiatry

## 2018-04-23 VITALS — BP 109/70 | HR 106 | Temp 99.2°F | Wt 157.0 lb

## 2018-04-23 DIAGNOSIS — F3130 Bipolar disorder, current episode depressed, mild or moderate severity, unspecified: Secondary | ICD-10-CM

## 2018-04-23 DIAGNOSIS — F411 Generalized anxiety disorder: Secondary | ICD-10-CM | POA: Diagnosis not present

## 2018-04-23 DIAGNOSIS — F5105 Insomnia due to other mental disorder: Secondary | ICD-10-CM

## 2018-04-23 MED ORDER — CARIPRAZINE HCL 1.5 MG PO CAPS: 1.5000 mg | ORAL_CAPSULE | Freq: Every day | ORAL | 2 refills | Status: DC

## 2018-04-23 MED ORDER — VILAZODONE HCL 40 MG PO TABS: 40.0000 mg | ORAL_TABLET | Freq: Every day | ORAL | 2 refills | Status: DC

## 2018-04-23 MED ORDER — OXCARBAZEPINE 300 MG PO TABS: 300.0000 mg | ORAL_TABLET | Freq: Two times a day (BID) | ORAL | 1 refills | Status: DC

## 2018-04-23 NOTE — Progress Notes (Signed)
[facility] MD OP Progress Note  04/23/2018 2:38 PM Jennifer [name]  MRN:  [id number]  Chief Complaint: ' I am here for follow up." Chief Complaint    Follow-up; Medication Refill     HPI: Jennifer [name] is a 62 year old Caucasian female, lives in [place], married, has a history of bipolar disorder, anxiety issues, presented to the clinic today for a follow-up visit.  She today reports she has been having some worsening depressive symptoms.  She reports she has crying spells, tearfulness, lack of motivation, lethargy and so on.  She continues to have several psychosocial stressors.  She reports she has been emotionally distressed seeing her grandchildren who are currently without their mother.  Their mother who is her daughter-in-law has a substance abuse problem and is currently back in a recovery home.  Patient's son who is the father of the children has been taking care of the children.  Patient however reports she has been helping out a lot.  Patient reports sleep is fair.  She continues to take the Ambien.  Patient continues to be in psychotherapy.  She however reports she missed an appointment recently and not sure what happened.    Discussed changing her medication since her depressive symptoms have been worsening.  She is currently on Seroquel.  Discussed Vraylar.  And agrees with plan.  Patient to continue to see Ms. Royal Piedra for psychotherapy.  Discussed IOP referral, patient however reports she is not ready for the same yet.   Visit Diagnosis:    ICD-10-CM   1. Bipolar I disorder, most recent episode depressed (HCC) F31.30 Vilazodone HCl (VIIBRYD) 40 MG TABS  2. GAD (generalized anxiety disorder) F41.1   3. Insomnia due to mental condition F51.05     Past Psychiatric History: Have reviewed past psychiatric history from my progress note on 02/05/2018.  Past Medical History:  Past Medical History:  Diagnosis Date  . Anemia 1985   bled during a cervical biopsy, led to bleed  transfusion   . Anxiety   . Arthritis    pt. reports that her neck is stiff   . Bipolar depression ([place])   . Blood dyscrasia    reported hx [name] '85; saw hematologist Dr. Janese Banks 10/2016, VWD work-up WNL, not felt to have a primary bleeding disorder   . CAD (coronary artery disease)   . 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 COPD (chronic obstructive pulmonary disease) ([place])    dr. Gar Ponto [facility]    PCP  . Depression   . Depression with anxiety   . Diabetes mellitus   . Dyspnea    W/ EXERTION   . Enlarged liver   . Family history of adverse reaction to anesthesia    father woke up slowly  . Fibromyalgia   . Generalized anxiety disorder   . GERD (gastroesophageal reflux disease)   . Hyperlipidemia   . Hypertension   . Low back pain   . Migraines   . Neuropathy   . Persistent disorder of initiating or maintaining sleep   . Von Willebrand disease ([place])     Past Surgical History:  Procedure Laterality Date  . ABDOMINAL HYSTERECTOMY    . ANTERIOR LAT LUMBAR FUSION N/A 12/10/2016   Procedure: LUMBAR FOUR-FIVE  Anterior lateral lumbar interbody fusion with LUMBAR FOUR-FIVE  Posterolateral fusion/Re-operative laminectomy LUMBAR FIVE-SACRUM ONE Bilateral, Laminectomy at LUMBAR FOUR-FIVE , excision of extradural mass  right LUMBAR FIVE-SACRUM ONE  and Left LUMBAR FOUR-FIVE  with Mazor;  Surgeon: Kevan Ny Ditty, MD;  Location: [facility];  Service: Neurosurgery;  Laterality: N/A;  . APPLICATION OF ROBOTIC ASSISTANCE FOR SPINAL PROCEDURE N/A 12/10/2016   Procedure: APPLICATION OF ROBOTIC ASSISTANCE FOR SPINAL PROCEDURE;  Surgeon: Kevan Ny Ditty, MD;  Location: [facility];  Service: Neurosurgery;  Laterality: N/A;  . BACK SURGERY  2007   lumbar  . BREAST IMPLANT REMOVAL    . CARDIAC CATHETERIZATION     CAD,PCI of LAD, Cypher stent 2.5-28mm  . CARDIAC CATHETERIZATION     PCI of mid-LAD in stent restenosis with a drug-eluting stent  .  CARDIAC CATHETERIZATION  06/03/13   [facility];MID LAD 100 % STENOSIS; PRIOR STENT; MID RCA 40 % STENOSIS  . CORONARY ARTERY BYPASS GRAFT   08-12-2011   CABG x 3  . CORONARY STENT PLACEMENT    . LUMBAR FUSION     L1-S1  . LUMBAR PERCUTANEOUS PEDICLE SCREW 1 LEVEL N/A 12/10/2016   Procedure: Extension of pedicle screw fixation to LUMBAR FOUR;  Surgeon: Kevan Ny Ditty, MD;  Location: [facility];  Service: Neurosurgery;  Laterality: N/A;  . NASAL SINUS SURGERY  1997   tuirbinate reduction     Family Psychiatric History: I have reviewed family psychiatric history from my progress note on 02/05/2018.  Family History:  Family History  Problem Relation Age of Onset  . Aneurysm Father        abdominal  . Diabetes Father   . Alcohol abuse Father   . Hypertension Father   . Aneurysm Brother        abdominal  . Alcohol abuse Brother   . Hypertension Brother   . Diabetes Mother   . Alcohol abuse Mother   . Hypertension Other        family hx  . Hyperlipidemia Other        family hx  . Diabetes Other        family hx   Substance abuse history: Denies  Social History: Reviewed social history from my progress note on 02/05/2018. Social History   Socioeconomic History  . Marital status: Married    Spouse name: Photographer  . Number of children: 3  . Years of education: Not on file  . Highest education level: Associate degree: occupational, Hotel manager, or vocational program  Occupational History  . Not on file  Social Needs  . Financial resource strain: Not on file  . Food insecurity:    Worry: Not on file    Inability: Not on file  . Transportation needs:    Medical: No    Non-medical: No  Tobacco Use  . Smoking status: Former Smoker    Years: 16.00    Types: Cigarettes    Last attempt to quit: 06/20/2010    Years since quitting: 7.8  . Smokeless tobacco: Never Used  Substance and Sexual Activity  . Alcohol use: No    Alcohol/week: 0.0 oz  . Drug use: No  . Sexual activity: Yes   Lifestyle  . Physical activity:    Days per week: 0 days    Minutes per session: 0 min  . Stress: Rather much  Relationships  . Social connections:    Talks on phone: Not on file    Gets together: Not on file    Attends religious service: More than 4 times per year    Active member of club or organization: No    Attends meetings  of clubs or organizations: Never    Relationship status: Married  Other Topics Concern  . Not on file  Social History Narrative  . Not on file    Allergies:  Allergies  Allergen Reactions  . No Known Allergies     Metabolic Disorder Labs: Lab Results  Component Value Date   HGBA1C 9.4 (H) 12/05/2017   MPG 223.08 12/05/2017   MPG 197 10/30/2016   Lab Results  Component Value Date   PROLACTIN 6.1 12/05/2017   Lab Results  Component Value Date   CHOL 238 (H) 12/05/2017   TRIG 819 (H) 12/05/2017   HDL 41 12/05/2017   CHOLHDL 5.8 12/05/2017   VLDL UNABLE TO CALCULATE IF TRIGLYCERIDE OVER 400 mg/dL 12/05/2017   LDLCALC UNABLE TO CALCULATE IF TRIGLYCERIDE OVER 400 mg/dL 12/05/2017   LDLCALC SEE COMMENT 07/09/2012   Lab Results  Component Value Date   TSH 86.000 (H) 12/05/2017   TSH 0.664 06/16/2013    Therapeutic Level Labs: No results found for: LITHIUM No results found for: VALPROATE No components found for:  CBMZ  Current Medications: Current Outpatient Medications  Medication Sig Dispense Refill  . albuterol-ipratropium (COMBIVENT) 18-103 MCG/ACT inhaler Inhale 2 puffs into the lungs daily as needed for wheezing or shortness of breath.     Marland Kitchen amLODipine (NORVASC) 5 MG tablet Take 1 tablet (5 mg total) by mouth 2 (two) times daily. 180 tablet 4  . atorvastatin (LIPITOR) 80 MG tablet Take 1 tablet (80 mg total) by mouth daily. 90 tablet 3  . Blood Glucose Monitoring Suppl (FREESTYLE FREEDOM LITE) W/DEVICE KIT Use 3 (three) times daily. 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 Dx Code: 250.00    . carisoprodol (SOMA) 350 MG tablet  Take 1 tablet (350 mg total) by mouth every 6 (six) hours as needed for muscle spasms. 120 tablet 0  . carvedilol (COREG) 12.5 MG tablet Take 1 tablet (12.5 mg total) by mouth 2 (two) times daily with a meal. 180 tablet 3  . fluticasone (FLONASE) 50 MCG/ACT nasal spray Place 1 spray into both nostrils 2 (two) times daily as needed for allergies.    Marland Kitchen glucose blood (FREESTYLE LITE) test strip Use 3 (three) times daily    . HUMULIN 70/30 KWIKPEN (70-30) 100 UNIT/ML PEN Inject 20 Units into the skin 2 (two) times daily.     Marland Kitchen HYDROcodone-acetaminophen (NORCO) 10-325 MG tablet Take 1 tablet by mouth every 6 (six) hours as needed.    Marland Kitchen lisinopril (PRINIVIL,ZESTRIL) 20 MG tablet Take 1 tablet (20 mg total) by mouth daily. 90 tablet 3  . metFORMIN (GLUCOPHAGE-XR) 750 MG 24 hr tablet     . nitroGLYCERIN (NITROSTAT) 0.4 MG SL tablet Place 1 tablet (0.4 mg total) under the tongue every 5 (five) minutes as needed for chest pain. 25 tablet 6  . omeprazole (PRILOSEC) 20 MG capsule Take 20 mg by mouth daily.     . Oxcarbazepine (TRILEPTAL) 300 MG tablet Take 1 tablet (300 mg total) by mouth 2 (two) times daily. 180 tablet 1  . RESTASIS 0.05 % ophthalmic emulsion Place 1 drop into both eyes daily as needed (for dryness).     Marland Kitchen rOPINIRole (REQUIP) 0.5 MG tablet Take by mouth.    . Spacer/Aero-Holding Chambers (E-Z SPACER) inhaler Reported on 01/15/2016    . SYNTHROID 25 MCG tablet     . Vilazodone HCl (VIIBRYD) 40 MG TABS Take 1 tablet (40 mg total) by mouth daily. 90 tablet 2  .  zolpidem (AMBIEN) 5 MG tablet Take 1-2 tablets (5-10 mg total) by mouth at bedtime as needed for sleep. 90 tablet 1  . cariprazine (VRAYLAR) capsule Take 1 capsule (1.5 mg total) by mouth daily. 30 capsule 2  . LYRICA 50 MG capsule     . sitaGLIPtin (JANUVIA) 100 MG tablet Take 100 mg by mouth daily.     No current facility-administered medications for this visit.      Musculoskeletal: Strength & Muscle Tone: within normal  limits Gait & Station: normal Patient leans: N/A  Psychiatric Specialty Exam: Review of Systems  Psychiatric/Behavioral: Positive for depression. The patient is nervous/anxious.   All other systems reviewed and are negative.   Blood pressure 109/70, pulse (!) 106, temperature 99.2 F (37.3 C), temperature source Oral, weight 157 lb (71.2 kg).Body mass index is 27.81 kg/m.  General Appearance: Casual  Eye Contact:  Good  Speech:  Clear and Coherent  Volume:  Normal  Mood:  Anxious and Dysphoric  Affect:  Tearful  Thought Process:  Goal Directed and Descriptions of Associations: Intact  Orientation:  Full (Time, Place, and Person)  Thought Content: Logical   Suicidal Thoughts:  No  Homicidal Thoughts:  No  Memory:  Immediate;   Fair Recent;   Fair Remote;   Fair  Judgement:  Fair  Insight:  Fair  Psychomotor Activity:  Normal  Concentration:  Concentration: Fair and Attention Span: Fair  Recall:  [employer] of Knowledge: Fair  Language: Fair  Akathisia:  No  Handed:  Right  AIMS (if indicated): denies tremors , rigidity, stiffness  Assets:  Communication Skills Desire for Improvement Social Support  ADL's:  Intact  Cognition: WNL  Sleep:  Fair   Screenings: PHQ2-9     Office Visit from 05/09/2016 in [facility] Procedure visit from 03/20/2016 in [facility] Office Visit from 03/12/2016 in [facility] Clinical Support from 02/13/2016 in [facility] Office Visit from 12/14/2015 in [facility]  PHQ-2 Total Score  0  0  0  0  0       Assessment and Plan: Kierah is a 62 year old Caucasian female who has a history of bipolar disorder, anxiety disorder, presented to the clinic today for a follow-up visit.  Patient continues to have psychosocial stressors of her  grandchildren, her daughter and all relapsing on drug and so on.  Patient continues to have depressive symptoms.  Discussed readjusting her medication.  Also discussed more intensive psychotherapy/IOP.  Plan as noted below.  Plan Bipolar disorder, depressed Continue Trileptal 300 mg p.o. twice daily. Discontinue Seroquel for lack of efficacy. Start Vraylar 1.5 mg po daily.  Anxiety symptoms Continue Viibryd 40 mg p.o. daily  For insomnia Continue Ambien 5-10 mg p.o. nightly as needed Discontinue Seroquel.  Follow-up in clinic in 6 weeks. Patient to continue to follow Ms. Peacock for psychotherapy.  Also discussed IOP program referral.  Patient however reports she is not ready yet.  More than 50 % of the time was spent for psychoeducation and supportive psychotherapy and care coordination.  This note was generated in part or whole with voice recognition software. Voice recognition is usually quite accurate but there are transcription errors that can and very often do occur. I apologize for any typographical errors that were not detected and corrected.        Jaycion Treml,  MD 04/24/2018, 8:54 AM

## 2018-04-23 NOTE — Patient Instructions (Signed)
Cariprazine oral capsules What is this medicine? CARIPRAZINE (car i PRA zeen) is an antipsychotic. It is used to treat schizophrenia or bipolar disorder. Bipolar disorder is also known as manic-depression. This medicine may be used for other purposes; ask your health care provider or pharmacist if you have questions. COMMON BRAND NAME(S): VRAYLAR What should I tell my health care provider before I take this medicine? They need to know if you have any of these conditions: -dementia -diabetes -difficulty swallowing -heart disease -history of breast cancer -kidney disease -liver disease -low blood counts, like low white cell, platelet, or red cell counts -low blood pressure -Parkinson's disease -seizures -suicidal thoughts, plans, or attempt; a previous suicide attempt by you or a family member -an unusual or allergic reaction to cariprazine, other medicines, foods, dyes, or preservatives -pregnant or trying to get pregnant -breast-feeding How should I use this medicine? Take this medicine by mouth with a glass of water. Follow the directions on the prescription label. You may take it with or without food. Take your medicine at regular intervals. Do not take it more often than directed. Do not stop taking except on your doctor's advice. Talk to your pediatrician regarding the use of this medicine in children. Special care may be needed. Overdosage: If you think you have taken too much of this medicine contact a poison control center or emergency room at once. NOTE: This medicine is only for you. Do not share this medicine with others. What if I miss a dose? If you miss a dose, take it as soon as you can. If it is almost time for your next dose, take only that dose. Do not take double or extra doses. What may interact with this medicine? Do not take this medicine with any of the following medications: -metoclopramide This medicine may also interact with the following  medications: -carbamazepine -certain medicines for depression, anxiety, or psychotic disturbances -certain medicines for sleep -itraconazole -ketoconazole -medicines for blood pressure -rifampin -St John's wort This list may not describe all possible interactions. Give your health care provider a list of all the medicines, herbs, non-prescription drugs, or dietary supplements you use. Also tell them if you smoke, drink alcohol, or use illegal drugs. Some items may interact with your medicine. What should I watch for while using this medicine? Visit your doctor or health care professional for regular checks on your progress. It may be several weeks before you see the full effects of this medicine. Notify your doctor or health care professional if your symptoms get worse, if you have new symptoms, if you are having an unusual effect from this medicine, or if you feel out of control, very discouraged or think you might harm yourself or others. Do not suddenly stop taking this medicine. You may need to gradually reduce the dose. Ask your doctor or health care professional for advice. You may get dizzy or drowsy. Do not drive, use machinery, or do anything that needs mental alertness until you know how this medicine affects you. Do not stand or sit up quickly, especially if you are an older patient. This reduces the risk of dizzy or fainting spells. Alcohol can increase dizziness and drowsiness. Avoid alcoholic drinks. This medicine may cause dry eyes and blurred vision. If you wear contact lenses you may feel some discomfort. Lubricating drops may help. See your eye doctor if the problem does not go away or is severe. This medicine can reduce the response of your body to heat or cold. Dress   warm in cold weather and stay hydrated in hot weather. If possible, avoid extreme temperatures like saunas, hot tubs, very hot or cold showers, or activities that can cause dehydration such as vigorous exercise. Women  should inform their doctor if they wish to become pregnant or think they might be pregnant. The effects of this medicine on an unborn child are not known. A registry is available to monitor pregnancy outcomes in pregnant women exposed to this medicine or similar medicines. Talk to your health care professional or pharmacist for more information. What side effects may I notice from receiving this medicine? Side effects that you should report to your doctor or health care professional as soon as possible: -allergic reactions like skin rash, itching or hives, swelling of the face, lips, or tongue -changes in emotions or moods -confusion -difficulty swallowing -feeling faint or lightheaded, falls -fever or chills, sore throat -inability to control muscle movements in the face, mouth, hands, arms, or legs -increased hunger or thirst -increased urination -missed or irregular menstrual periods -problems with balance, talking, walking -redness, blistering, peeling or loosening of the skin, including inside the mouth -seizures -stiff muscles -suicidal thoughts or other mood changes -unusually weak or tired Side effects that usually do not require medical attention (report to your doctor or health care professional if they continue or are bothersome): -constipation -dizziness -drowsiness -nausea, vomiting -restlessness -upset stomach -weight gain This list may not describe all possible side effects. Call your doctor for medical advice about side effects. You may report side effects to FDA at 1-800-FDA-1088. Where should I keep my medicine? Keep out of the reach of children. Store at room temperature between 15 and 30 degrees C (59 and 86 degrees F). Protect from light. Throw away any unused medicine after the expiration date. NOTE: This sheet is a summary. It may not cover all possible information. If you have questions about this medicine, talk to your doctor, pharmacist, or health care  provider.  2018 Elsevier/Gold Standard (2016-09-13 15:40:17)

## 2018-04-24 ENCOUNTER — Encounter: Payer: Self-pay | Admitting: Psychiatry

## 2018-04-28 ENCOUNTER — Ambulatory Visit (INDEPENDENT_AMBULATORY_CARE_PROVIDER_SITE_OTHER): Admitting: Licensed Clinical Social Worker

## 2018-04-28 DIAGNOSIS — F3130 Bipolar disorder, current episode depressed, mild or moderate severity, unspecified: Secondary | ICD-10-CM | POA: Diagnosis not present

## 2018-04-28 NOTE — Progress Notes (Signed)
   THERAPIST PROGRESS NOTE  Session Time:74mn  Participation Level: Active  Behavioral Response: CasualAlertDepressed  Type of Therapy: Individual Therapy  Treatment Goals addressed: Anxiety and Coping  Interventions: CBT, Motivational Interviewing and Solution Focused  Summary: Jennifer LAMBRIGHTis a 62y.o. female who presents with continued symptoms of her diagnosis.  Therapist met with Patient in an outpatient setting to assess current mood and assist with making progress towards goals through the use of therapeutic intervention. Therapist did a brief mood check, assessing anger, fear, disgust, excitement, happiness, and sadness.  Patient reports sadness due to DSS placing her Grandchildren in foster care about a year ago.  Patient [facility] that [facility]  Explored current triggers and coping skills used.  Discussed higher level of care (IOP).     Suicidal/Homicidal: No  Plan: Return again in 2 weeks.  Diagnosis: Axis I: Bipolar, Depressed    Axis II: No diagnosis    NLubertha South LCSW 04/28/2018

## 2018-05-29 ENCOUNTER — Telehealth: Payer: Self-pay | Admitting: Cardiovascular Disease

## 2018-05-29 NOTE — Telephone Encounter (Signed)
Spoke with patient and she states that yesterday her blood pressure was 212/110. She states that it was 189/110 at 5:30pm. Reviewed her medications and she was only taking amlodipine and carvedilol once daily. Instructed her to resume those as ordered at twice daily and to continue monitoring her blood pressures. She reports that she was taking extra lisinopril to try and get it down but it was not helping. Advised that if it should spike up like that again she could take one nitroglycerin and that would help but to use caution and this should not be done frequently. Instructed her to please call us back if her blood pressures persistently remain 140/90 or higher. She verbalized understanding of our conversation, agreement with plan, and had no further questions at this time.

## 2018-05-29 NOTE — Telephone Encounter (Signed)
Patient calling to check on status of return call Please call to discuss

## 2018-05-29 NOTE — Telephone Encounter (Signed)
Pt states he BP last night was 200/111, and slowly came down to 165/82. She would like to discuss increasing her medications. She also has a question about what she can take for a headache.

## 2018-06-02 ENCOUNTER — Ambulatory Visit (INDEPENDENT_AMBULATORY_CARE_PROVIDER_SITE_OTHER): Admitting: Psychiatry

## 2018-06-02 ENCOUNTER — Encounter: Payer: Self-pay | Admitting: Psychiatry

## 2018-06-02 ENCOUNTER — Other Ambulatory Visit: Payer: Self-pay

## 2018-06-02 VITALS — BP 116/79 | HR 92 | Temp 99.2°F | Wt 160.6 lb

## 2018-06-02 DIAGNOSIS — F3130 Bipolar disorder, current episode depressed, mild or moderate severity, unspecified: Secondary | ICD-10-CM | POA: Diagnosis not present

## 2018-06-02 DIAGNOSIS — F5105 Insomnia due to other mental disorder: Secondary | ICD-10-CM | POA: Diagnosis not present

## 2018-06-02 DIAGNOSIS — F411 Generalized anxiety disorder: Secondary | ICD-10-CM

## 2018-06-02 MED ORDER — MIRTAZAPINE 15 MG PO TABS: 7.5000 mg | ORAL_TABLET | Freq: Every day | ORAL | 1 refills | Status: DC

## 2018-06-02 NOTE — Patient Instructions (Addendum)
Mirtazapine tablets What is this medicine? MIRTAZAPINE (mir TAZ a peen) is used to treat depression. This medicine may be used for other purposes; ask your health care provider or pharmacist if you have questions. COMMON BRAND NAME(S): Remeron What should I tell my health care provider before I take this medicine? They need to know if you have any of these conditions: -bipolar disorder -glaucoma -kidney disease -liver disease -suicidal thoughts -an unusual or allergic reaction to mirtazapine, other medicines, foods, dyes, or preservatives -pregnant or trying to get pregnant -breast-feeding How should I use this medicine? Take this medicine by mouth with a glass of water. Follow the directions on the prescription label. Take your medicine at regular intervals. Do not take your medicine more often than directed. Do not stop taking this medicine suddenly except upon the advice of your doctor. Stopping this medicine too quickly may cause serious side effects or your condition may worsen. A special MedGuide will be given to you by the pharmacist with each prescription and refill. Be sure to read this information carefully each time. Talk to your pediatrician regarding the use of this medicine in children. Special care may be needed. Overdosage: If you think you have taken too much of this medicine contact a poison control center or emergency room at once. NOTE: This medicine is only for you. Do not share this medicine with others. What if I miss a dose? If you miss a dose, take it as soon as you can. If it is almost time for your next dose, take only that dose. Do not take double or extra doses. What may interact with this medicine? Do not take this medicine with any of the following medications: -linezolid -MAOIs like Carbex, Eldepryl, Marplan, Nardil, and Parnate -methylene blue (injected into a vein) This medicine may also interact with the following medications: -alcohol -antiviral  medicines for HIV or AIDS -certain medicines that treat or prevent blood clots like warfarin -certain medicines for depression, anxiety, or psychotic disturbances -certain medicines for fungal infections like ketoconazole and itraconazole -certain medicines for migraine headache like almotriptan, eletriptan, frovatriptan, naratriptan, rizatriptan, sumatriptan, zolmitriptan -certain medicines for seizures like carbamazepine or phenytoin -certain medicines for sleep -cimetidine -erythromycin -fentanyl -lithium -medicines for blood pressure -nefazodone -rasagiline -rifampin -supplements like St. John's wort, kava kava, valerian -tramadol -tryptophan This list may not describe all possible interactions. Give your health care provider a list of all the medicines, herbs, non-prescription drugs, or dietary supplements you use. Also tell them if you smoke, drink alcohol, or use illegal drugs. Some items may interact with your medicine. What should I watch for while using this medicine? Tell your doctor if your symptoms do not get better or if they get worse. Visit your doctor or health care professional for regular checks on your progress. Because it may take several weeks to see the full effects of this medicine, it is important to continue your treatment as prescribed by your doctor. Patients and their families should watch out for new or worsening thoughts of suicide or depression. Also watch out for sudden changes in feelings such as feeling anxious, agitated, panicky, irritable, hostile, aggressive, impulsive, severely restless, overly excited and hyperactive, or not being able to sleep. If this happens, especially at the beginning of treatment or after a change in dose, call your health care professional. You may get drowsy or dizzy. Do not drive, use machinery, or do anything that needs mental alertness until you know how this medicine affects you. Do not   stand or sit up quickly, especially if  you are an older patient. This reduces the risk of dizzy or fainting spells. Alcohol may interfere with the effect of this medicine. Avoid alcoholic drinks. This medicine may cause dry eyes and blurred vision. If you wear contact lenses you may feel some discomfort. Lubricating drops may help. See your eye doctor if the problem does not go away or is severe. Your mouth may get dry. Chewing sugarless gum or sucking hard candy, and drinking plenty of water may help. Contact your doctor if the problem does not go away or is severe. What side effects may I notice from receiving this medicine? Side effects that you should report to your doctor or health care professional as soon as possible: -allergic reactions like skin rash, itching or hives, swelling of the face, lips, or tongue -anxious -changes in vision -chest pain -confusion -elevated mood, decreased need for sleep, racing thoughts, impulsive behavior -eye pain -fast, irregular heartbeat -feeling faint or lightheaded, falls -feeling agitated, angry, or irritable -fever or chills, sore throat -hallucination, loss of contact with reality -loss of balance or coordination -mouth sores -redness, blistering, peeling or loosening of the skin, including inside the mouth -restlessness, pacing, inability to keep still -seizures -stiff muscles -suicidal thoughts or other mood changes -trouble passing urine or change in the amount of urine -trouble sleeping -unusual bleeding or bruising -unusually weak or tired -vomiting Side effects that usually do not require medical attention (report to your doctor or health care professional if they continue or are bothersome): -change in appetite -constipation -dizziness -dry mouth -muscle aches or pains -nausea -tired -weight gain This list may not describe all possible side effects. Call your doctor for medical advice about side effects. You may report side effects to FDA at 1-800-FDA-1088. Where  should I keep my medicine? Keep out of the reach of children. Store at room temperature between 15 and 30 degrees C (59 and 86 degrees F) Protect from light and moisture. Throw away any unused medicine after the expiration date. NOTE: This sheet is a summary. It may not cover all possible information. If you have questions about this medicine, talk to your doctor, pharmacist, or health care provider.  2018 Elsevier/Gold Standard (2016-03-14 17:30:45)  STOP SEROQUEL START REMERON 7.5 MG TABLET AT BEDTIME. TRY TAKING AMBIEN AS NEEDED OR IF YOU WAKE UP IN THE MIDDLE OF THE NIGHT. TAKE TRILEPTAL 300 MG ( 2 TABLET) AT BEDTIME.

## 2018-06-02 NOTE — Progress Notes (Signed)
[place] MD OP Progress Note  06/02/2018 2:37 PM Jennifer [name]  MRN:  [id number]  Chief Complaint: ' I am here for follow up." Chief Complaint    Follow-up; Medication Refill     HPI: Jennifer [name] is a 62 year old Caucasian female, lives in [place], married, has a history of bipolar disorder, anxiety, presented to the clinic today for a follow-up visit.  Patient today reports her mood symptoms as improving.  She reports that the Vraylar is helpful with her mood lability.  She reports she continues to have several psychosocial stressors.  She is worried about her grandchildren, her daughter-in-law who is their mother continues to have substance abuse problem and is currently at a recovery place and patient continues to have financial problems as well as relationship issues with her husband.  Patient also reports some recent sleep issues.  She reports even though she takes Ambien as well as she continues to take Seroquel even though she was asked to discontinue it last visit, she continues to have sleep problems on a regular basis.  Discussed with patient to stop the Seroquel and discussed adding another medication.  She has tried trazodone in the past and does not like the effect.  Hence discussed Remeron and she agrees with plan.  Patient also reports she is distressed about her medical problems and recently has been having some elevated blood pressure issues.  Her PCP is currently making changes with her blood pressure medications.  She wonders whether her new medication-Vraylar be contributing to it.  Discussed with patient that since her PCP is currently managing her blood pressure, to give it more time.  Also since Vraylar is helpfull with her mood symptoms, will not make any changes with it today.    Visit Diagnosis:    ICD-10-CM   1. Bipolar I disorder, most recent episode depressed ([place]) F31.30   2. GAD (generalized anxiety disorder) F41.1   3. Insomnia due to mental condition F51.05     Past  Psychiatric History: Have reviewed past psychiatric history from my progress note on 02/05/2018.  Past Medical History:  Past Medical History:  Diagnosis Date  . Anemia 1985   bled during a cervical biopsy, led to bleed transfusion   . Anxiety   . Arthritis    pt. reports that her neck is stiff   . Bipolar depression ([place])   . Blood dyscrasia    reported hx [name] '85; saw hematologist Dr. Janese Banks 10/2016, VWD work-up WNL, not felt to have a primary bleeding disorder   . CAD (coronary artery disease)   . 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 COPD (chronic obstructive pulmonary disease) ([place])    [facility]. Gar [facility]    PCP  . Depression   . Depression with anxiety   . Diabetes mellitus   . Dyspnea    W/ EXERTION   . Enlarged liver   . Family history of adverse reaction to anesthesia    father woke up slowly  . Fibromyalgia   . Generalized anxiety disorder   . GERD (gastroesophageal reflux disease)   . Hyperlipidemia   . Hypertension   . Low back pain   . Migraines   . Neuropathy   . Persistent disorder of initiating or maintaining sleep   . Von Willebrand disease ([place])     Past Surgical History:  Procedure Laterality Date  . ABDOMINAL HYSTERECTOMY    .  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 APPLICATION OF ROBOTIC ASSISTANCE FOR SPINAL PROCEDURE N/A 12/10/2016   Procedure: APPLICATION OF ROBOTIC ASSISTANCE FOR SPINAL PROCEDURE;  Surgeon: Kevan Ny Ditty, MD;  Location: [facility];  Service: Neurosurgery;  Laterality: N/A;  . BACK  SURGERY  2007   lumbar  . BREAST IMPLANT REMOVAL    . CARDIAC CATHETERIZATION     CAD,PCI of LAD, Cypher stent 2.5-28mm  . CARDIAC CATHETERIZATION     PCI of mid-LAD in stent restenosis with a drug-eluting stent  . CARDIAC CATHETERIZATION  06/03/13   [facility];MID LAD 100 % STENOSIS; PRIOR STENT; MID RCA 40 % STENOSIS  . CORONARY ARTERY BYPASS GRAFT   08-12-2011   CABG x 3  . CORONARY STENT PLACEMENT    . LUMBAR FUSION     L1-S1  . LUMBAR PERCUTANEOUS PEDICLE SCREW 1 LEVEL N/A 12/10/2016   Procedure: Extension of pedicle screw fixation to LUMBAR FOUR;  Surgeon: Kevan Ny Ditty, MD;  Location: [facility];  Service: Neurosurgery;  Laterality: N/A;  . NASAL SINUS SURGERY  1997   tuirbinate reduction     Family Psychiatric History: Have reviewed family psychiatric history from my progress note on 02/05/2018.  Family History:  Family History  Problem Relation Age of Onset  . Aneurysm Father        abdominal  . Diabetes Father   . Alcohol abuse Father   . Hypertension Father   . Aneurysm Brother        abdominal  . Alcohol abuse Brother   . Hypertension Brother   . Diabetes Mother   . Alcohol abuse Mother   . Hypertension Other        family hx  . Hyperlipidemia Other        family hx  . Diabetes Other        family hx    Social History: Reviewed social history from my progress note on 02/05/2018. Social History   Socioeconomic History  . Marital status: Married    Spouse name: Photographer  . Number of children: 3  . Years of education: Not on file  . Highest education level: Associate degree: occupational, Hotel manager, or vocational program  Occupational History  . Not on file  Social Needs  . Financial resource strain: Not on file  . Food insecurity:    Worry: Not on file    Inability: Not on file  . Transportation needs:    Medical: No    Non-medical: No  Tobacco Use  . Smoking status: Former Smoker    Years: 16.00    Types: Cigarettes    Last attempt to quit: 06/20/2010     Years since quitting: 7.9  . Smokeless tobacco: Never Used  Substance and Sexual Activity  . Alcohol use: No    Alcohol/week: 0.0 oz  . Drug use: No  . Sexual activity: Yes  Lifestyle  . Physical activity:    Days per week: 0 days    Minutes per session: 0 min  . Stress: Rather much  Relationships  . Social connections:    Talks on phone: Not on file  Gets together: Not on file    Attends religious service: More than 4 times per year    Active member of club or organization: No    Attends meetings of clubs or organizations: Never    Relationship status: Married  Other Topics Concern  . Not on file  Social History Narrative  . Not on file    Allergies:  Allergies  Allergen Reactions  . No Known Allergies     Metabolic Disorder Labs: Lab Results  Component Value Date   HGBA1C 9.4 (H) 12/05/2017   MPG 223.08 12/05/2017   MPG 197 10/30/2016   Lab Results  Component Value Date   PROLACTIN 6.1 12/05/2017   Lab Results  Component Value Date   CHOL 238 (H) 12/05/2017   TRIG 819 (H) 12/05/2017   HDL 41 12/05/2017   CHOLHDL 5.8 12/05/2017   VLDL UNABLE TO CALCULATE IF TRIGLYCERIDE OVER 400 mg/dL 12/05/2017   LDLCALC UNABLE TO CALCULATE IF TRIGLYCERIDE OVER 400 mg/dL 12/05/2017   LDLCALC SEE COMMENT 07/09/2012   Lab Results  Component Value Date   TSH 86.000 (H) 12/05/2017   TSH 0.664 06/16/2013    Therapeutic Level Labs: No results found for: LITHIUM No results found for: VALPROATE No components found for:  CBMZ  Current Medications: Current Outpatient Medications  Medication Sig Dispense Refill  . albuterol-ipratropium (COMBIVENT) 18-103 MCG/ACT inhaler Inhale 2 puffs into the lungs daily as needed for wheezing or shortness of breath.     Marland Kitchen amLODipine (NORVASC) 5 MG tablet Take 1 tablet (5 mg total) by mouth 2 (two) times daily. 180 tablet 4  . atorvastatin (LIPITOR) 80 MG tablet Take 1 tablet (80 mg total) by mouth daily. 90 tablet 3  . Blood  Glucose Monitoring Suppl (FREESTYLE FREEDOM LITE) W/DEVICE KIT Use 3 (three) times daily. 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 Dx Code: 250.00    . cariprazine (VRAYLAR) capsule Take 1 capsule (1.5 mg total) by mouth daily. 30 capsule 2  . carisoprodol (SOMA) 350 MG tablet Take 1 tablet (350 mg total) by mouth every 6 (six) hours as needed for muscle spasms. 120 tablet 0  . carvedilol (COREG) 12.5 MG tablet Take 1 tablet (12.5 mg total) by mouth 2 (two) times daily with a meal. 180 tablet 3  . fluticasone (FLONASE) 50 MCG/ACT nasal spray Place 1 spray into both nostrils 2 (two) times daily as needed for allergies.    Marland Kitchen glucose blood (FREESTYLE LITE) test strip Use 3 (three) times daily    . HUMULIN 70/30 KWIKPEN (70-30) 100 UNIT/ML PEN Inject 20 Units into the skin 2 (two) times daily.     Marland Kitchen HYDROcodone-acetaminophen (NORCO) 10-325 MG tablet Take 1 tablet by mouth every 6 (six) hours as needed.    Marland Kitchen lisinopril (PRINIVIL,ZESTRIL) 20 MG tablet Take 1 tablet (20 mg total) by mouth daily. 90 tablet 3  . LYRICA 50 MG capsule     . metFORMIN (GLUCOPHAGE-XR) 750 MG 24 hr tablet     . mirtazapine (REMERON) 15 MG tablet Take 0.5 tablets (7.5 mg total) by mouth at bedtime. 45 tablet 1  . nitroGLYCERIN (NITROSTAT) 0.4 MG SL tablet Place 1 tablet (0.4 mg total) under the tongue every 5 (five) minutes as needed for chest pain. 25 tablet 6  . omeprazole (PRILOSEC) 20 MG capsule Take 20 mg by mouth daily.     . Oxcarbazepine (TRILEPTAL) 300 MG tablet Take 1 tablet (300 mg total) by mouth 2 (two) times daily. [place]  tablet 1  . RESTASIS 0.05 % ophthalmic emulsion Place 1 drop into both eyes daily as needed (for dryness).     Marland Kitchen rOPINIRole (REQUIP) 0.5 MG tablet Take by mouth.    . Spacer/Aero-Holding Chambers (E-Z SPACER) inhaler Reported on 01/15/2016    . SYNTHROID 25 MCG tablet     . Vilazodone HCl (VIIBRYD) 40 MG TABS Take 1 tablet (40 mg total) by mouth daily. 90 tablet 2  . zolpidem (AMBIEN) 5 MG tablet  Take 1-2 tablets (5-10 mg total) by mouth at bedtime as needed for sleep. 90 tablet 1  . sitaGLIPtin (JANUVIA) 100 MG tablet Take 100 mg by mouth daily.     No current facility-administered medications for this visit.      Musculoskeletal: Strength & Muscle Tone: within normal limits Gait & Station: normal Patient leans: N/A  Psychiatric Specialty Exam: Review of Systems  Psychiatric/Behavioral: Positive for depression. The patient is nervous/anxious and has insomnia.   All other systems reviewed and are negative.   Blood pressure 116/79, pulse 92, temperature 99.2 F (37.3 C), temperature source Oral, weight 160 lb 9.6 oz (72.8 kg).Body mass index is 28.45 kg/m.  General Appearance: Casual  Eye Contact:  Fair  Speech:  Clear and Coherent  Volume:  Normal  Mood:  Anxious and Dysphoric  Affect:  Congruent  Thought Process:  Goal Directed and Descriptions of Associations: Intact  Orientation:  Full (Time, Place, and Person)  Thought Content: Logical   Suicidal Thoughts:  No  Homicidal Thoughts:  No  Memory:  Immediate;   Fair Recent;   Fair Remote;   Fair  Judgement:  Fair  Insight:  Fair  Psychomotor Activity:  Normal  Concentration:  Concentration: Fair and Attention Span: Fair  Recall:  [employer] of Knowledge: Fair  Language: Fair  Akathisia:  No  Handed:  Right  AIMS (if indicated): denies tremors, rigidity, stiffness  Assets:  Communication Skills Desire for Improvement Social Support  ADL's:  Intact  Cognition: WNL  Sleep:  disrupted   Screenings: PHQ2-9     Office Visit from 05/09/2016 in [place] Procedure visit from 03/20/2016 in [place] Office Visit from 03/12/2016 in [place] Clinical Support from 02/13/2016 in [place] [facility] Office Visit from 12/14/2015 in [place]  PHQ-2 Total Score  0  0  0  0  0       Assessment and Plan: Sharika is a 62 year old Caucasian female who has a history of bipolar disorder, anxiety disorder, presented to the clinic today for a follow-up visit.  Patient continues to have psychosocial stressors of her grandchildren, her daughter in law relapsing on drug and her own medical problems.  Discussed the following medication changes with patient.  Plan as noted below.  Plan Bipolar disorder depressed Continue Trileptal 300 mg p.o. twice daily.  Even though patient was prescribed 600 mg daily she only has been taking 300 mg.  This was discussed with patient.  Encouraged compliance. Continue Vraylar 1.5 mg p.o. Daily.  For anxiety symptoms Continue Viibryd 40 mg p.o. daily  For insomnia Patient continues to take Seroquel even though it was discontinued last visit.  She reports she has been taking 50 mg at bedtime.  In spite of that she continues to have sleep problems.  She also continues to take Ambien 5-10 mg at  bedtime as needed.  Discussed with patient to stop the Seroquel since she is not sleeping even though she takes it. Discussed starting Remeron to which she agrees.  Start Remeron 7.5 mg p.o. nightly.  Discussed with patient to try just the Remeron at bedtime and to take the Ambien 5 mg if she wakes up in the middle of the night.  Discussed with patient to continue psychotherapy.  Follow-up in clinic in 1 month.  More than 50 % of the time was spent for psychoeducation and supportive psychotherapy and care coordination.  This note was generated in part or whole with voice recognition software. Voice recognition is usually quite accurate but there are transcription errors that can and very often do occur. I apologize for any typographical errors that were not detected and corrected.         Ursula Alert, MD 06/02/2018, 2:37 PM

## 2018-06-03 ENCOUNTER — Ambulatory Visit: Admitting: Licensed Clinical Social Worker

## 2018-06-17 ENCOUNTER — Ambulatory Visit (INDEPENDENT_AMBULATORY_CARE_PROVIDER_SITE_OTHER): Admitting: Licensed Clinical Social Worker

## 2018-06-17 ENCOUNTER — Telehealth: Payer: Self-pay

## 2018-06-17 ENCOUNTER — Other Ambulatory Visit: Payer: Self-pay

## 2018-06-17 ENCOUNTER — Ambulatory Visit (INDEPENDENT_AMBULATORY_CARE_PROVIDER_SITE_OTHER): Admitting: Psychiatry

## 2018-06-17 ENCOUNTER — Encounter: Payer: Self-pay | Admitting: Psychiatry

## 2018-06-17 VITALS — BP 157/77 | HR 88 | Temp 98.6°F | Wt 165.2 lb

## 2018-06-17 DIAGNOSIS — F411 Generalized anxiety disorder: Secondary | ICD-10-CM | POA: Diagnosis not present

## 2018-06-17 DIAGNOSIS — F5105 Insomnia due to other mental disorder: Secondary | ICD-10-CM

## 2018-06-17 DIAGNOSIS — F3130 Bipolar disorder, current episode depressed, mild or moderate severity, unspecified: Secondary | ICD-10-CM | POA: Diagnosis not present

## 2018-06-17 MED ORDER — ZOLPIDEM TARTRATE 10 MG PO TABS: 5.0000 mg | ORAL_TABLET | Freq: Every evening | ORAL | 0 refills | Status: DC | PRN

## 2018-06-17 NOTE — Telephone Encounter (Signed)
Lorrin Mais needs to be refilled pt states she takes it 2 at bedtime.and she also states that her insurance will be cheaper for a 90 day supply.  Pt states that she hadn't had any vraylar in a while.  So I called pharmacy and they stated it will be sent out tomorrow. Pt states she must have lost it or someone took it because I told her she should have enough and she states she has not had it to take.  Pt is seeing nicole now.    zolpidem (AMBIEN) 10 MG tablet  Medication  Date: 06/17/2018 Department: [facility] Ordering/Authorizing: Ursula Alert, MD  Order Providers   Prescribing Provider Encounter Provider  Ursula Alert, MD Carlynn Purl, CMA  Outpatient Medication Detail    Disp Refills Start End   zolpidem (AMBIEN) 10 MG tablet 90 tablet 0 06/17/2018    Sig - Route: Take 0.5-1 tablets (5-10 mg total) by mouth at bedtime as needed for sleep. - Oral   Sent to pharmacy as: zolpidem (AMBIEN) 10 MG tablet   E-Prescribing Status: Receipt confirmed by pharmacy (06/17/2018 9:30 AM EDT

## 2018-06-17 NOTE — Progress Notes (Signed)
[facility] MD OP Progress Note  06/17/2018 4:41 PM COREN CROWNOVER  MRN:  [id number]  Chief Complaint: ' I am here for follow up." Chief Complaint    Follow-up; Medication Refill; Medication Problem; Altered Mental Status     HPI: Jennifer [name] is a 62 year old Caucasian female, lives in [place], married, has a history of bipolar disorder, anxiety disorder, presented to the clinic today for a follow-up visit.  Patient presented today for a sooner appointment since she has been struggling with some mood lability and sleep problems.  Patient reports she has been having some relationship struggles with her husband since she ran out of her medications.  Patient reports she has not been taking her Ambien because she never got her medication filled from her pharmacy.  Patient also reports that she will only receive her Vraylar  tomorrow since its mail-in delivery pharmacy.  Patient hence has been having mood lability, irritability and sleep problems.  Discussed with patient that she can be given samples today.  She agrees with plan.  Patient will continue psychotherapy with Ms. Royal Piedra and had an appointment today with her.  Visit Diagnosis:    ICD-10-CM   1. Bipolar I disorder, most recent episode depressed ([place]) F31.30   2. GAD (generalized anxiety disorder) F41.1   3. Insomnia due to mental condition F51.05     Past Psychiatric History: Have reviewed past psychiatric history from my progress note on 02/05/2018.  Past Medical History:  Past Medical History:  Diagnosis Date  . Anemia 1985   bled during a cervical biopsy, led to bleed transfusion   . Anxiety   . Arthritis    pt. reports that her neck is stiff   . Bipolar depression ([place])   . Blood dyscrasia    reported hx [name] '85; saw hematologist Dr. Janese Banks 10/2016, VWD work-up WNL, not felt to have a primary bleeding disorder   . CAD (coronary artery disease)   . 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 COPD  (chronic obstructive pulmonary disease) ([place])    dr. Gar [facility]    PCP  . Depression   . Depression with anxiety   . Diabetes mellitus   . Dyspnea    W/ EXERTION   . Enlarged liver   . Family history of adverse reaction to anesthesia    father woke up slowly  . Fibromyalgia   . Generalized anxiety disorder   . GERD (gastroesophageal reflux disease)   . Hyperlipidemia   . Hypertension   . Low back pain   . Migraines   . Neuropathy   . Persistent disorder of initiating or maintaining sleep   . Von Willebrand disease ([place])     Past Surgical History:  Procedure Laterality Date  . ABDOMINAL HYSTERECTOMY    . 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 APPLICATION OF ROBOTIC ASSISTANCE FOR SPINAL PROCEDURE N/A 12/10/2016   Procedure: APPLICATION OF ROBOTIC ASSISTANCE FOR SPINAL PROCEDURE;  Surgeon: Kevan Ny Ditty, MD;  Location: [facility];  Service: Neurosurgery;  Laterality: N/A;  .  BACK SURGERY  2007   lumbar  . BREAST IMPLANT REMOVAL    . CARDIAC CATHETERIZATION     CAD,PCI of LAD, Cypher stent 2.5-28mm  . CARDIAC CATHETERIZATION     PCI of mid-LAD in stent restenosis with a drug-eluting stent  . CARDIAC CATHETERIZATION  06/03/13   [facility];MID LAD 100 % STENOSIS; PRIOR STENT; MID RCA 40 % STENOSIS  . CORONARY ARTERY BYPASS GRAFT   08-12-2011   CABG x 3  . CORONARY STENT PLACEMENT    . LUMBAR FUSION     L1-S1  . LUMBAR PERCUTANEOUS PEDICLE SCREW 1 LEVEL N/A 12/10/2016   Procedure: Extension of pedicle screw fixation to LUMBAR FOUR;  Surgeon: Kevan Ny Ditty,  MD;  Location: [facility];  Service: Neurosurgery;  Laterality: N/A;  . NASAL SINUS SURGERY  1997   tuirbinate reduction     Family Psychiatric History: Reviewed family psychiatric history from my progress note on 02/05/2018  Family History:  Family History  Problem Relation Age of Onset  . Aneurysm Father        abdominal  . Diabetes Father   . Alcohol abuse Father   . Hypertension Father   . Aneurysm Brother        abdominal  . Alcohol abuse Brother   . Hypertension Brother   . Diabetes Mother   . Alcohol abuse Mother   . Hypertension Other        family hx  . Hyperlipidemia Other        family hx  . Diabetes Other        family hx    Social History: Have reviewed social history from my progress note on 02/05/2018. Social History   Socioeconomic History  . Marital status: Married    Spouse name: Photographer  . Number of children: 3  . Years of education: Not on file  . Highest education level: Associate degree: occupational, Hotel manager, or vocational program  Occupational History  . Not on file  Social Needs  . Financial resource strain: Not on file  . Food insecurity:    Worry: Not on file    Inability: Not on file  . Transportation needs:    Medical: No    Non-medical: No  Tobacco Use  . Smoking status: Former Smoker    Years: 16.00    Types: Cigarettes    Last attempt to quit: 06/20/2010    Years since quitting: 7.9  . Smokeless tobacco: Never Used  Substance and Sexual Activity  . Alcohol use: No    Alcohol/week: 0.0 standard drinks  . Drug use: No  . Sexual activity: Yes  Lifestyle  . Physical activity:    Days per week: 0 days    Minutes per session: 0 min  . Stress: Rather much  Relationships  . Social connections:    Talks on phone: Not on file    Gets together: Not on file    Attends religious service: More than 4 times per year    Active member of club or organization: No    Attends meetings of clubs or organizations: Never    Relationship status:  Married  Other Topics Concern  . Not on file  Social History Narrative  . Not on file    Allergies:  Allergies  Allergen Reactions  . No Known Allergies     Metabolic Disorder Labs: Lab Results  Component Value Date   HGBA1C 9.4 (H) 12/05/2017   MPG 223.08 12/05/2017   MPG 197  10/30/2016   Lab Results  Component Value Date   PROLACTIN 6.1 12/05/2017   Lab Results  Component Value Date   CHOL 238 (H) 12/05/2017   TRIG 819 (H) 12/05/2017   HDL 41 12/05/2017   CHOLHDL 5.8 12/05/2017   VLDL UNABLE TO CALCULATE IF TRIGLYCERIDE OVER 400 mg/dL 12/05/2017   LDLCALC UNABLE TO CALCULATE IF TRIGLYCERIDE OVER 400 mg/dL 12/05/2017   LDLCALC SEE COMMENT 07/09/2012   Lab Results  Component Value Date   TSH 86.000 (H) 12/05/2017   TSH 0.664 06/16/2013    Therapeutic Level Labs: No results found for: LITHIUM No results found for: VALPROATE No components found for:  CBMZ  Current Medications: Current Outpatient Medications  Medication Sig Dispense Refill  . albuterol-ipratropium (COMBIVENT) 18-103 MCG/ACT inhaler Inhale 2 puffs into the lungs daily as needed for wheezing or shortness of breath.     Marland Kitchen amLODipine (NORVASC) 5 MG tablet Take 1 tablet (5 mg total) by mouth 2 (two) times daily. 180 tablet 4  . atorvastatin (LIPITOR) 80 MG tablet Take 1 tablet (80 mg total) by mouth daily. 90 tablet 3  . Blood Glucose Monitoring Suppl (FREESTYLE FREEDOM LITE) W/DEVICE KIT Use 3 (three) times daily. 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 Dx Code: 250.00    . cariprazine (VRAYLAR) capsule Take 1 capsule (1.5 mg total) by mouth daily. 30 capsule 2  . carisoprodol (SOMA) 350 MG tablet Take 1 tablet (350 mg total) by mouth every 6 (six) hours as needed for muscle spasms. 120 tablet 0  . carvedilol (COREG) 12.5 MG tablet Take 1 tablet (12.5 mg total) by mouth 2 (two) times daily with a meal. 180 tablet 3  . fluticasone (FLONASE) 50 MCG/ACT nasal spray Place 1 spray into both nostrils 2  (two) times daily as needed for allergies.    Marland Kitchen glucose blood (FREESTYLE LITE) test strip Use 3 (three) times daily    . HUMULIN 70/30 KWIKPEN (70-30) 100 UNIT/ML PEN Inject 20 Units into the skin 2 (two) times daily.     Marland Kitchen HYDROcodone-acetaminophen (NORCO) 10-325 MG tablet Take 1 tablet by mouth every 6 (six) hours as needed.    Marland Kitchen lisinopril (PRINIVIL,ZESTRIL) 20 MG tablet Take 1 tablet (20 mg total) by mouth daily. 90 tablet 3  . LYRICA 50 MG capsule     . metFORMIN (GLUCOPHAGE-XR) 750 MG 24 hr tablet     . mirtazapine (REMERON) 15 MG tablet Take 0.5 tablets (7.5 mg total) by mouth at bedtime. 45 tablet 1  . nitroGLYCERIN (NITROSTAT) 0.4 MG SL tablet Place 1 tablet (0.4 mg total) under the tongue every 5 (five) minutes as needed for chest pain. 25 tablet 6  . omeprazole (PRILOSEC) 20 MG capsule Take 20 mg by mouth daily.     . Oxcarbazepine (TRILEPTAL) 300 MG tablet Take 1 tablet (300 mg total) by mouth 2 (two) times daily. 180 tablet 1  . RESTASIS 0.05 % ophthalmic emulsion Place 1 drop into both eyes daily as needed (for dryness).     Marland Kitchen rOPINIRole (REQUIP) 0.5 MG tablet Take by mouth.    . sitaGLIPtin (JANUVIA) 100 MG tablet Take 100 mg by mouth daily.    Marland Kitchen Spacer/Aero-Holding Chambers (E-Z SPACER) inhaler Reported on 01/15/2016    . SYNTHROID 25 MCG tablet     . Vilazodone HCl (VIIBRYD) 40 MG TABS Take 1 tablet (40 mg total) by mouth daily. 90 tablet 2  . zolpidem (AMBIEN) 10 MG tablet Take 0.5-1 tablets (5-10 mg  total) by mouth at bedtime as needed for sleep. 90 tablet 0   No current facility-administered medications for this visit.      Musculoskeletal: Strength & Muscle Tone: within normal limits Gait & Station: normal Patient leans: N/A  Psychiatric Specialty Exam: Review of Systems  Psychiatric/Behavioral: Positive for depression. The patient has insomnia.   All other systems reviewed and are negative.   Blood pressure (!) 157/77, pulse 88, temperature 98.6 F (37 C),  temperature source Oral, weight 165 lb 3.2 oz (74.9 kg).Body mass index is 29.26 kg/m.  General Appearance: Casual  Eye Contact:  Fair  Speech:  Clear and Coherent  Volume:  Normal  Mood:  Anxious and Dysphoric  Affect:  Tearful  Thought Process:  Goal Directed and Descriptions of Associations: Intact  Orientation:  Full (Time, Place, and Person)  Thought Content: Logical   Suicidal Thoughts:  No  Homicidal Thoughts:  No  Memory:  Immediate;   Fair Recent;   Fair Remote;   Fair  Judgement:  Fair  Insight:  Fair  Psychomotor Activity:  Normal  Concentration:  Concentration: Fair and Attention Span: Fair  Recall:  [employer] of Knowledge: Fair  Language: Fair  Akathisia:  No  Handed:  Right  AIMS (if indicated): denies tremors, rigidity, stiffness  Assets:  Communication Skills Desire for Improvement Social Support  ADL's:  Intact  Cognition: WNL  Sleep:  Poor   Screenings: PHQ2-9     Office Visit from 05/09/2016 in [place] Procedure visit from 03/20/2016 in [facility] Office Visit from 03/12/2016 in [facility] Clinical Support from 02/13/2016 in [facility] Office Visit from 12/14/2015 in [place]  PHQ-2 Total Score  0  0  0  0  0       Assessment and Plan: Ladeja is a 62 year old Caucasian female who has a history of bipolar disorder, anxiety disorder, presented to the clinic today for a follow-up visit.  Patient today reports she ran out of her medications since she has been having some trouble with the pharmacy.  Patient hence has been having mood lability as well as sleep problems.  Discussed with patient that she can be provided with samples today.  Also her medications were sent to a local pharmacy .  Plan Bipolar disorder Continue Trileptal 300 mg  po bid. Continue Vraylar 1.5 mg p.o. daily.  Provided with samples.  Anxiety symptoms Continue Viibryd 40 mg p.o. daily  For insomnia Ambien 10 mg p.o. nightly as needed Unknown if she is taking her mirtazapine as prescribed.  Patient will continue psychotherapy with Ms. Royal Piedra.  Follow-up in clinic in 4 weeks.  More than 50 % of the time was spent for psychoeducation and supportive psychotherapy and care coordination.  This note was generated in part or whole with voice recognition software. Voice recognition is usually quite accurate but there are transcription errors that can and very often do occur. I apologize for any typographical errors that were not detected and corrected.       Ursula Alert, MD 06/17/2018, 4:41 PM

## 2018-06-17 NOTE — Telephone Encounter (Signed)
Seen patient today , she was taking two of Ambien 5 mg and now it is 10 mg . Discussed with patient.

## 2018-06-17 NOTE — Telephone Encounter (Signed)
pt called left message that she needs enough zolpidem to get to her next appt.   pt next appt is 07-01-18  zolpidem (AMBIEN) 5 MG tablet  Medication  Date: 03/09/2018 Department: [facility] Ordering/Authorizing: Ursula Alert, MD  Order Providers   Prescribing Provider Encounter Provider  Ursula Alert, MD Carlynn Purl, CMA  Outpatient Medication Detail    Disp Refills Start End   zolpidem (AMBIEN) 5 MG tablet 90 tablet 1 03/09/2018    Sig - Route: Take 1-2 tablets (5-10 mg total) by mouth at bedtime as needed for sleep. - Oral   Sent to pharmacy as: zolpidem (AMBIEN) 5 MG tablet   E-Prescribing Status: Receipt confirmed by pharmacy (03/09/2018 5:18 PM EDT)

## 2018-06-17 NOTE — Telephone Encounter (Signed)
Sent Ambien to pharmacy 

## 2018-07-01 ENCOUNTER — Ambulatory Visit (INDEPENDENT_AMBULATORY_CARE_PROVIDER_SITE_OTHER): Admitting: Licensed Clinical Social Worker

## 2018-07-01 ENCOUNTER — Ambulatory Visit: Admitting: Psychiatry

## 2018-07-01 DIAGNOSIS — F411 Generalized anxiety disorder: Secondary | ICD-10-CM | POA: Diagnosis not present

## 2018-07-01 DIAGNOSIS — F3130 Bipolar disorder, current episode depressed, mild or moderate severity, unspecified: Secondary | ICD-10-CM

## 2018-07-04 ENCOUNTER — Emergency Department

## 2018-07-04 ENCOUNTER — Other Ambulatory Visit: Payer: Self-pay

## 2018-07-04 ENCOUNTER — Inpatient Hospital Stay
Admission: EM | Admit: 2018-07-04 | Discharge: 2018-07-06 | DRG: 392 | Disposition: A | Discharge status: Home / Self Care | Attending: Internal Medicine | Admitting: Internal Medicine

## 2018-07-04 DIAGNOSIS — R197 Diarrhea, unspecified: Secondary | ICD-10-CM

## 2018-07-04 DIAGNOSIS — I2510 Atherosclerotic heart disease of native coronary artery without angina pectoris: Secondary | ICD-10-CM | POA: Diagnosis present

## 2018-07-04 DIAGNOSIS — Z79899 Other long term (current) drug therapy: Secondary | ICD-10-CM

## 2018-07-04 DIAGNOSIS — Z955 Presence of coronary angioplasty implant and graft: Secondary | ICD-10-CM | POA: Diagnosis not present

## 2018-07-04 DIAGNOSIS — Z87891 Personal history of nicotine dependence: Secondary | ICD-10-CM

## 2018-07-04 DIAGNOSIS — E119 Type 2 diabetes mellitus without complications: Secondary | ICD-10-CM

## 2018-07-04 DIAGNOSIS — E1140 Type 2 diabetes mellitus with diabetic neuropathy, unspecified: Secondary | ICD-10-CM | POA: Diagnosis present

## 2018-07-04 DIAGNOSIS — E1169 Type 2 diabetes mellitus with other specified complication: Secondary | ICD-10-CM

## 2018-07-04 DIAGNOSIS — F411 Generalized anxiety disorder: Secondary | ICD-10-CM | POA: Diagnosis present

## 2018-07-04 DIAGNOSIS — R0789 Other chest pain: Secondary | ICD-10-CM | POA: Diagnosis present

## 2018-07-04 DIAGNOSIS — J449 Chronic obstructive pulmonary disease, unspecified: Secondary | ICD-10-CM | POA: Diagnosis present

## 2018-07-04 DIAGNOSIS — R112 Nausea with vomiting, unspecified: Secondary | ICD-10-CM | POA: Diagnosis present

## 2018-07-04 DIAGNOSIS — Z951 Presence of aortocoronary bypass graft: Secondary | ICD-10-CM | POA: Diagnosis not present

## 2018-07-04 DIAGNOSIS — K529 Noninfective gastroenteritis and colitis, unspecified: Secondary | ICD-10-CM | POA: Diagnosis present

## 2018-07-04 DIAGNOSIS — K219 Gastro-esophageal reflux disease without esophagitis: Secondary | ICD-10-CM | POA: Diagnosis present

## 2018-07-04 DIAGNOSIS — G479 Sleep disorder, unspecified: Secondary | ICD-10-CM | POA: Diagnosis present

## 2018-07-04 DIAGNOSIS — Z794 Long term (current) use of insulin: Secondary | ICD-10-CM | POA: Diagnosis not present

## 2018-07-04 DIAGNOSIS — E871 Hypo-osmolality and hyponatremia: Secondary | ICD-10-CM | POA: Diagnosis present

## 2018-07-04 DIAGNOSIS — T50995A Adverse effect of other drugs, medicaments and biological substances, initial encounter: Secondary | ICD-10-CM | POA: Diagnosis present

## 2018-07-04 DIAGNOSIS — I10 Essential (primary) hypertension: Secondary | ICD-10-CM | POA: Diagnosis present

## 2018-07-04 DIAGNOSIS — M797 Fibromyalgia: Secondary | ICD-10-CM | POA: Diagnosis present

## 2018-07-04 DIAGNOSIS — E785 Hyperlipidemia, unspecified: Secondary | ICD-10-CM | POA: Diagnosis present

## 2018-07-04 DIAGNOSIS — F419 Anxiety disorder, unspecified: Secondary | ICD-10-CM | POA: Diagnosis present

## 2018-07-04 DIAGNOSIS — Z981 Arthrodesis status: Secondary | ICD-10-CM | POA: Diagnosis not present

## 2018-07-04 DIAGNOSIS — E876 Hypokalemia: Secondary | ICD-10-CM | POA: Diagnosis present

## 2018-07-04 DIAGNOSIS — D680 Von Willebrand's disease: Secondary | ICD-10-CM | POA: Diagnosis present

## 2018-07-04 DIAGNOSIS — F3130 Bipolar disorder, current episode depressed, mild or moderate severity, unspecified: Secondary | ICD-10-CM | POA: Diagnosis present

## 2018-07-04 DIAGNOSIS — E782 Mixed hyperlipidemia: Secondary | ICD-10-CM | POA: Diagnosis present

## 2018-07-04 LAB — BASIC METABOLIC PANEL
Anion gap: 11 (ref 5–15)
BUN: 8 mg/dL (ref 8–23)
CO2: 26 mmol/L (ref 22–32)
Calcium: 9.6 mg/dL (ref 8.9–10.3)
Chloride: 87 mmol/L — ABNORMAL LOW (ref 98–111)
Creatinine, Ser: 0.61 mg/dL (ref 0.44–1.00)
GFR calc Af Amer: >60 mL/min (ref >60)
Glucose, Bld: 155 mg/dL — ABNORMAL HIGH (ref 70–99)
POTASSIUM: 4.1 mmol/L (ref 3.5–5.1)
Sodium: 124 mmol/L — ABNORMAL LOW (ref 135–145)

## 2018-07-04 LAB — CBC
HEMATOCRIT: 42.1 % (ref 35.0–47.0)
HEMOGLOBIN: 14.5 g/dL (ref 12.0–16.0)
MCH: 29.1 pg (ref 26.0–34.0)
MCHC: 34.5 g/dL (ref 32.0–36.0)
MCV: 84.4 fL (ref 80.0–100.0)
Platelets: 319 10*3/uL (ref 150–440)
RBC: 4.99 MIL/uL (ref 3.80–5.20)
RDW: 14.6 % — ABNORMAL HIGH (ref 11.5–14.5)
WBC: 6.9 10*3/uL (ref 3.6–11.0)

## 2018-07-04 LAB — SODIUM: SODIUM: 123 mmol/L — AB (ref 135–145)

## 2018-07-04 LAB — TROPONIN I: Troponin I: <0.03 ng/mL (ref <0.03)

## 2018-07-04 MED ORDER — ONDANSETRON HCL 4 MG/2ML IJ SOLN: 4.0000 mg | Freq: Four times a day (QID) | INTRAMUSCULAR | Status: DC | PRN

## 2018-07-04 MED ORDER — ONDANSETRON HCL 4 MG/2ML IJ SOLN
4.0000 mg | Freq: Once | INTRAMUSCULAR | Status: AC
  Administered 2018-07-04: 4 mg via INTRAVENOUS
  Filled 2018-07-04: qty 2

## 2018-07-04 MED ORDER — SODIUM CHLORIDE 0.9 % IV SOLN
INTRAVENOUS | Status: AC
  Administered 2018-07-04 – 2018-07-05 (×2): via INTRAVENOUS

## 2018-07-04 MED ORDER — ONDANSETRON HCL 4 MG PO TABS: 4.0000 mg | ORAL_TABLET | Freq: Four times a day (QID) | ORAL | Status: DC | PRN

## 2018-07-04 MED ORDER — HYDRALAZINE HCL 20 MG/ML IJ SOLN: 10.0000 mg | INTRAMUSCULAR | Status: DC | PRN

## 2018-07-04 MED ORDER — PROMETHAZINE HCL 25 MG/ML IJ SOLN: 12.5000 mg | Freq: Four times a day (QID) | INTRAMUSCULAR | Status: DC | PRN

## 2018-07-04 MED ORDER — LABETALOL HCL 5 MG/ML IV SOLN
10.0000 mg | INTRAVENOUS | Status: DC | PRN
  Administered 2018-07-04: 10 mg via INTRAVENOUS
  Filled 2018-07-04: qty 4

## 2018-07-04 NOTE — ED Provider Notes (Signed)
[facility] Emergency Department Provider Note   ____________________________________________    I have reviewed the triage vital signs and the nursing notes.   HISTORY  Chief Complaint Nausea vomiting diarrhea    HPI Jennifer [name] is a 62 y.o. female who presents with complaints of nausea vomiting and diarrhea over the last 3 to 4 days.  She reports very little p.o. intake because of her nausea.  She is concerned because she is diabetic.  She also reports intermittent chest pressure, none currently.  No shortness of breath.  No fevers or chills.  Mild myalgias.  No recent medications.  She no longer takes lithium.  Past Medical History:  Diagnosis Date  . Anemia 1985   bled during a cervical biopsy, led to bleed transfusion   . Anxiety   . Arthritis    pt. reports that her neck is stiff   . Bipolar depression ([place])   . Blood dyscrasia    reported hx [name] '85; saw hematologist Dr. Janese Banks 10/2016, VWD work-up WNL, not felt to have a primary bleeding disorder   . CAD (coronary artery disease)   . 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 COPD (chronic obstructive pulmonary disease) ([place])    dr. Gar Ponto [facility]    PCP  . Depression   . Depression with anxiety   . Diabetes mellitus   . Dyspnea    W/ EXERTION   . Enlarged liver   . Family history of adverse reaction to anesthesia    father woke up slowly  . Fibromyalgia   . Generalized anxiety disorder   . GERD (gastroesophageal reflux disease)   . Hyperlipidemia   . Hypertension   . Low back pain   . Migraines   . Neuropathy   . Persistent disorder of initiating or maintaining sleep   . Von Willebrand disease [facility])     Patient Active Problem List   Diagnosis Date Noted  . Hyponatremia 07/04/2018  . Nausea vomiting and diarrhea 07/04/2018  . Lumbosacral spondylosis with radiculopathy 12/10/2016  . Complex regional pain syndrome of both  lower extremities 06/03/2016  . Spinal stenosis, lumbar region, with neurogenic claudication 05/29/2015  . Lumbar post-laminectomy syndrome 04/20/2015  . Facet syndrome, lumbar 04/20/2015  . Lumbar radiculopathy 03/06/2015  . DDD (degenerative disc disease), cervical 03/05/2015  . Occipital neuralgia 03/05/2015  . Bilateral occipital neuralgia 03/05/2015  . H/O elevated lipids 03/01/2015  . H/O: HTN (hypertension) 03/01/2015  . H/O: hypothyroidism 03/01/2015  . Insomnia, persistent 03/01/2015  . H/O disease 03/01/2015  . Aggrieved 03/01/2015  . Vitamin D deficiency 03/01/2015  . Anxiety, generalized 03/01/2015  . H/O diabetes mellitus 03/01/2015  . Bipolar 1 disorder, depressed ([place]) 03/01/2015  . Anxiety 03/01/2015  . Fibromyalgia 03/01/2015  . Essential (primary) hypertension 09/02/2014  . HLD (hyperlipidemia) 04/19/2014  . Hypertensive urgency 12/15/2013  . Dyspnea 11/24/2013  . Anxiety and depression 02/17/2013  . OP (osteoporosis) 08/27/2012  . Persons encountering health services in other specified circumstances 08/27/2012  . Coronary artery disease of native artery of native heart with stable angina pectoris ([place]) 07/15/2012  . Back pain, chronic 07/15/2012  . Diabetes mellitus ([place]) 07/15/2012  . GERD (gastroesophageal reflux disease) 07/15/2012  . Bipolar affective disorder ([place]) 07/15/2012  . Hypertriglyceridemia 07/15/2012  . Hernia of anterior abdominal wall 07/15/2012  . Dizziness 09/04/2011  . Back pain 09/04/2011  .  HTN (hypertension) 01/31/2011  . SMOKER 06/14/2010  . CAD (coronary artery disease) 06/13/2010  . CHEST PAIN UNSPECIFIED 06/13/2010    Past Surgical History:  Procedure Laterality Date  . ABDOMINAL HYSTERECTOMY    . 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 APPLICATION OF ROBOTIC ASSISTANCE FOR SPINAL PROCEDURE N/A 12/10/2016   Procedure: APPLICATION OF ROBOTIC ASSISTANCE FOR SPINAL PROCEDURE;  Surgeon: Kevan Ny Ditty, MD;  Location: [place];  Service: Neurosurgery;  Laterality: N/A;  . BACK SURGERY  2007   lumbar  . BREAST IMPLANT REMOVAL    . CARDIAC CATHETERIZATION     CAD,PCI of LAD, Cypher stent 2.5-28mm  . CARDIAC CATHETERIZATION     PCI of mid-LAD in stent restenosis with a drug-eluting stent  . CARDIAC CATHETERIZATION  06/03/13   [facility];MID LAD 100 % STENOSIS; PRIOR STENT; MID RCA 40 % STENOSIS  . CORONARY ARTERY BYPASS GRAFT   08-12-2011   CABG x 3  . CORONARY STENT PLACEMENT    . LUMBAR FUSION     L1-S1  . LUMBAR PERCUTANEOUS PEDICLE SCREW 1 LEVEL N/A 12/10/2016   Procedure: Extension of pedicle screw fixation to LUMBAR FOUR;  Surgeon: Kevan Ny Ditty, MD;  Location: [facility];  Service: Neurosurgery;  Laterality: N/A;  . NASAL SINUS SURGERY  1997   tuirbinate reduction     Prior to Admission medications   Medication Sig Start Date End Date Taking? Authorizing Provider  amLODipine (NORVASC) 5 MG tablet Take 1 tablet (5 mg total) by mouth 2 (two) times daily. 08/18/17  Yes Minna Merritts, MD  cariprazine (VRAYLAR) capsule Take 1 capsule (1.5 mg total) by mouth daily. 04/23/18  Yes Ursula Alert, MD  carisoprodol (SOMA) 350 MG tablet Take 1 tablet (350 mg total) by mouth every 6 (six) hours as needed for muscle spasms. 12/12/16  Yes Ditty, Kevan Ny, MD  carvedilol (COREG) 12.5 MG tablet Take 1 tablet (12.5 mg total) by mouth 2 (two) times daily with a meal. 08/18/17  Yes Gollan, Kathlene November, MD  Oxcarbazepine (TRILEPTAL) 300 MG tablet Take 1 tablet (300 mg total) by mouth 2 (two) times daily. 04/23/18  Yes Ursula Alert, MD  oxyCODONE (OXY IR/ROXICODONE) 5 MG immediate release tablet Take 5 mg by mouth every 6 (six) hours as needed for pain. 06/22/18  Yes [provider]  rOPINIRole (REQUIP) 0.5 MG tablet Take 1 mg by mouth at bedtime.  03/06/18  Yes [provider]  TRULICITY 1.5 ER/7.4YC SOPN Inject 1.5 mg into the skin once a week. Takes on Wednesday 06/24/18  Yes [provider]  zolpidem (AMBIEN) 10 MG tablet Take 0.5-1 tablets (5-10 mg total) by mouth at bedtime as needed for sleep. Patient taking differently: Take 10 mg by mouth at bedtime.  06/17/18  Yes Ursula Alert, MD  albuterol-ipratropium (COMBIVENT) 18-103 MCG/ACT inhaler Inhale 2 puffs into the lungs daily as needed for wheezing or shortness of breath.     [provider]  atorvastatin (LIPITOR) 80 MG tablet Take 1 tablet (80 mg total) by mouth daily. 08/18/17   Minna Merritts, MD  Blood Glucose Monitoring Suppl (FREESTYLE FREEDOM LITE) W/DEVICE  KIT Use 3 (three) times daily. 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 Dx Code: 250.00 07/19/15   [provider]  fluticasone (FLONASE) 50 MCG/ACT nasal spray Place 1 spray into both nostrils 2 (two) times daily as needed for allergies. 07/21/15   [provider]  glucose blood (FREESTYLE LITE) test strip Use 3 (three) times daily 02/05/18 02/06/19  [provider]  HUMULIN 70/30 KWIKPEN (70-30) 100 UNIT/ML PEN Inject 20 Units into the skin 2 (two) times daily.  05/07/16   [provider]  HYDROcodone-acetaminophen (NORCO) 10-325 MG tablet Take 1 tablet by mouth every 6 (six) hours as needed.    [provider]  lisinopril (PRINIVIL,ZESTRIL) 20 MG tablet Take 1 tablet (20 mg total) by mouth daily. 08/18/17   Minna Merritts, MD  LYRICA 50 MG capsule  03/30/18   [provider]  metFORMIN (GLUCOPHAGE-XR) 750 MG 24 hr tablet  03/03/18   [provider]  mirtazapine (REMERON) 15 MG tablet Take 0.5 tablets  (7.5 mg total) by mouth at bedtime. 06/02/18   Ursula Alert, MD  nitroGLYCERIN (NITROSTAT) 0.4 MG SL tablet Place 1 tablet (0.4 mg total) under the tongue every 5 (five) minutes as needed for chest pain. 08/18/17   Minna Merritts, MD  omeprazole (PRILOSEC) 20 MG capsule Take 20 mg by mouth daily.     [provider]  RESTASIS 0.05 % ophthalmic emulsion Place 1 drop into both eyes daily as needed (for dryness).  09/16/15   [provider]  sitaGLIPtin (JANUVIA) 100 MG tablet Take 100 mg by mouth daily. 09/24/16 09/24/17  [provider]  Spacer/Aero-Holding Josiah Lobo (E-Z SPACER) inhaler Reported on 01/15/2016 10/02/15   [provider]  SYNTHROID 25 MCG tablet  03/08/18   [provider]  Vilazodone HCl (VIIBRYD) 40 MG TABS Take 1 tablet (40 mg total) by mouth daily. 04/23/18   Ursula Alert, MD     Allergies No known allergies  Family History  Problem Relation Age of Onset  . Aneurysm Father        abdominal  . Diabetes Father   . Alcohol abuse Father   . Hypertension Father   . Aneurysm Brother        abdominal  . Alcohol abuse Brother   . Hypertension Brother   . Diabetes Mother   . Alcohol abuse Mother   . Hypertension Other        family hx  . Hyperlipidemia Other        family hx  . Diabetes Other        family hx    Social History Social History   Tobacco Use  . Smoking status: Former Smoker    Years: 16.00    Types: Cigarettes    Last attempt to quit: 06/20/2010    Years since quitting: 8.0  . Smokeless tobacco: Never Used  Substance Use Topics  . Alcohol use: No    Alcohol/week: 0.0 standard drinks  . Drug use: No    Review of Systems  Constitutional: No fever/chills Eyes: No visual changes.  ENT: No sore throat. Cardiovascular: Denies chest pain. Respiratory: Denies shortness of breath. Gastrointestinal: As above Genitourinary: Negative for dysuria. Musculoskeletal: Negative for back pain. Skin:  Negative for rash. Neurological: Negative for headaches or weakness   ____________________________________________   PHYSICAL EXAM:  VITAL SIGNS: ED Triage Vitals  Enc Vitals Group     BP 07/04/18 1854 (!) 162/85     Pulse Rate  07/04/18 1854 77     Resp 07/04/18 1854 18     Temp 07/04/18 1854 98 F (36.7 C)     Temp Source 07/04/18 1854 Oral     SpO2 07/04/18 1854 100 %     Weight 07/04/18 1851 72.1 kg (159 lb)     Height 07/04/18 1851 1.6 m (5' 3")     Head Circumference --      Peak Flow --      Pain Score 07/04/18 1851 4     Pain Loc --      Pain Edu? --      Excl. in [place]? --     Constitutional: Alert and oriented. No acute distress.  Nose: No congestion/rhinnorhea. Mouth/Throat: Mucous membranes are moist.   Neck:  Painless ROM Cardiovascular: Normal rate, regular rhythm. Grossly normal heart sounds.  Good peripheral circulation. Respiratory: Normal respiratory effort.  No retractions.  Gastrointestinal: Soft and nontender. No distention.   Musculoskeletal: No lower extremity tenderness nor edema.  Warm and well perfused Neurologic:  Normal speech and language. No gross focal neurologic deficits are appreciated.  Skin:  Skin is warm, dry and intact. No rash noted. Psychiatric: Mood and affect are normal. Speech and behavior are normal.  ____________________________________________   LABS (all labs ordered are listed, but only abnormal results are displayed)  Labs Reviewed  BASIC METABOLIC PANEL - Abnormal; Notable for the following components:      Result Value   Sodium 124 (*)    Chloride 87 (*)    Glucose, Bld 155 (*)    All other components within normal limits  CBC - Abnormal; Notable for the following components:   RDW 14.6 (*)    All other components within normal limits  TROPONIN I   ____________________________________________  EKG  ED ECG REPORT I, Lavonia Drafts, the attending physician, personally viewed and interpreted this ECG.  Date:  07/04/2018  Rhythm: normal sinus rhythm QRS Axis: normal Intervals: normal ST/T Wave abnormalities: Non specific ST changes Narrative Interpretation: no evidence of acute ischemia  ____________________________________________  RADIOLOGY  Chest x-ray normal ____________________________________________   PROCEDURES  Procedure(s) performed: No  Procedures   Critical Care performed: No ____________________________________________   INITIAL IMPRESSION / ASSESSMENT AND PLAN / ED COURSE  Pertinent labs & imaging results that were available during my care of the patient were reviewed by me and considered in my medical decision making (see chart for details).  She presents with complaints of nausea vomiting and diarrhea, intermittent chest pressure however EKG is reassuring.  Troponin is normal.  Patient has significant hyponatremia which on review of records appears to be new.  This may be related to decreased p.o. intake, no new medications reported.  Regardless she will require admission for correction.  IV Zofran given in the emergency department for nausea.    ____________________________________________   FINAL CLINICAL IMPRESSION(S) / ED DIAGNOSES  Final diagnoses:  Hyponatremia  Gastroenteritis        Note:  This document was prepared using Dragon voice recognition software and may include unintentional dictation errors.    Lavonia Drafts, MD 07/04/18 2238

## 2018-07-04 NOTE — H&P (Signed)
[facility] [place] NAME: Jennifer [name]    MR#:  [id number]  DATE OF BIRTH:  02-28-1956  DATE OF ADMISSION:  07/04/2018  PRIMARY CARE PHYSICIAN: Valera Castle, MD   REQUESTING/REFERRING PHYSICIAN: Corky Downs, MD  CHIEF COMPLAINT:   Chief Complaint  Patient presents with  . Chest Pain    HISTORY OF PRESENT ILLNESS:  Jennifer [name]  is a 62 y.o. female who presents with chief complaint as above.  Patient presents with several days of nausea and vomiting.  Today she developed some epigastric and chest discomfort.  She does have a history of coronary disease.  She states that she just started a new diabetes medication, Trulicity, on Wednesday.  She developed GI upset shortly after.  Today in the ED she is found to have normal work-up except for low sodium at 124.  Hospitalist were called for admission  PAST MEDICAL HISTORY:   Past Medical History:  Diagnosis Date  . Anemia 1985   bled during a cervical biopsy, led to bleed transfusion   . Anxiety   . Arthritis    pt. reports that her neck is stiff   . Bipolar depression ([place])   . Blood dyscrasia    reported hx [name] '85; saw hematologist Dr. Janese Banks 10/2016, VWD work-up WNL, not felt to have a primary bleeding disorder   . CAD (coronary artery disease)   . 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 COPD (chronic obstructive pulmonary disease) ([place])    dr. Gar P[facility]    PCP  . Depression   . Depression with anxiety   . Diabetes mellitus   . Dyspnea    W/ EXERTION   . Enlarged liver   . Family history of adverse reaction to anesthesia    father woke up slowly  . Fibromyalgia   . Generalized anxiety disorder   . GERD (gastroesophageal reflux disease)   . Hyperlipidemia   . Hypertension   . Low back pain   . Migraines   . Neuropathy   . Persistent disorder of initiating or maintaining sleep   . Von Willebrand disease  ([place])      PAST SURGICAL HISTORY:   Past Surgical History:  Procedure Laterality Date  . ABDOMINAL HYSTERECTOMY    . 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 APPLICATION OF ROBOTIC ASSISTANCE FOR SPINAL PROCEDURE N/A 12/10/2016   Procedure: APPLICATION OF ROBOTIC ASSISTANCE FOR SPINAL PROCEDURE;  Surgeon: Kevan Ny Ditty, MD;  Location: [facility];  Service: Neurosurgery;  Laterality: N/A;  . BACK SURGERY  2007   lumbar  . BREAST IMPLANT REMOVAL    . CARDIAC CATHETERIZATION     CAD,PCI of LAD, Cypher stent 2.5-28mm  . CARDIAC CATHETERIZATION     PCI of mid-LAD in stent restenosis with a drug-eluting stent  . CARDIAC CATHETERIZATION  06/03/13   [facility];MID LAD 100 % STENOSIS; PRIOR STENT; MID RCA 40 % STENOSIS  . CORONARY ARTERY BYPASS GRAFT   08-12-2011   CABG x 3  . CORONARY  STENT PLACEMENT    . LUMBAR FUSION     L1-S1  . LUMBAR PERCUTANEOUS PEDICLE SCREW 1 LEVEL N/A 12/10/2016   Procedure: Extension of pedicle screw fixation to LUMBAR FOUR;  Surgeon: Kevan Ny Ditty, MD;  Location: [facility];  Service: Neurosurgery;  Laterality: N/A;  . NASAL SINUS SURGERY  1997   tuirbinate reduction      SOCIAL HISTORY:   Social History   Tobacco Use  . Smoking status: Former Smoker    Years: 16.00    Types: Cigarettes    Last attempt to quit: 06/20/2010    Years since quitting: 8.0  . Smokeless tobacco: Never Used  Substance Use Topics  . Alcohol use: No    Alcohol/week: 0.0 standard drinks     FAMILY HISTORY:   Family History  Problem Relation Age of Onset  . Aneurysm Father         abdominal  . Diabetes Father   . Alcohol abuse Father   . Hypertension Father   . Aneurysm Brother        abdominal  . Alcohol abuse Brother   . Hypertension Brother   . Diabetes Mother   . Alcohol abuse Mother   . Hypertension Other        family hx  . Hyperlipidemia Other        family hx  . Diabetes Other        family hx     DRUG ALLERGIES:   Allergies  Allergen Reactions  . No Known Allergies     MEDICATIONS AT HOME:   Prior to Admission medications   Medication Sig Start Date End Date Taking? Authorizing Provider  amLODipine (NORVASC) 5 MG tablet Take 1 tablet (5 mg total) by mouth 2 (two) times daily. 08/18/17  Yes Gollan, Kathlene November, MD  oxyCODONE (OXY IR/ROXICODONE) 5 MG immediate release tablet Take 5 mg by mouth every 6 (six) hours as needed for pain. 06/22/18  Yes [provider]  albuterol-ipratropium (COMBIVENT) 18-103 MCG/ACT inhaler Inhale 2 puffs into the lungs daily as needed for wheezing or shortness of breath.     [provider]  atorvastatin (LIPITOR) 80 MG tablet Take 1 tablet (80 mg total) by mouth daily. 08/18/17   Minna Merritts, MD  Blood Glucose Monitoring Suppl (FREESTYLE FREEDOM LITE) W/DEVICE KIT Use 3 (three) times daily. 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 Dx Code: 250.00 07/19/15   [provider]  cariprazine (VRAYLAR) capsule Take 1 capsule (1.5 mg total) by mouth daily. 04/23/18   Ursula Alert, MD  carisoprodol (SOMA) 350 MG tablet Take 1 tablet (350 mg total) by mouth every 6 (six) hours as needed for muscle spasms. 12/12/16   Ditty, Kevan Ny, MD  carvedilol (COREG) 12.5 MG tablet Take 1 tablet (12.5 mg total) by mouth 2 (two) times daily with a meal. 08/18/17   Gollan, Kathlene November, MD  fluticasone (FLONASE) 50 MCG/ACT nasal spray Place 1 spray into both nostrils 2 (two) times daily as needed for allergies. 07/21/15   [provider]  glucose blood (FREESTYLE LITE) test strip Use 3 (three)  times daily 02/05/18 02/06/19  [provider]  HUMULIN 70/30 KWIKPEN (70-30) 100 UNIT/ML PEN Inject 20 Units into the skin 2 (two) times daily.  05/07/16   [provider]  HYDROcodone-acetaminophen (NORCO) 10-325 MG tablet Take 1 tablet by mouth every 6 (six) hours as needed.    [provider]  lisinopril (PRINIVIL,ZESTRIL) 20 MG tablet  Take 1 tablet (20 mg total) by mouth daily. 08/18/17   Minna Merritts, MD  LYRICA 50 MG capsule  03/30/18   [provider]  metFORMIN (GLUCOPHAGE-XR) 750 MG 24 hr tablet  03/03/18   [provider]  mirtazapine (REMERON) 15 MG tablet Take 0.5 tablets (7.5 mg total) by mouth at bedtime. 06/02/18   Ursula Alert, MD  nitroGLYCERIN (NITROSTAT) 0.4 MG SL tablet Place 1 tablet (0.4 mg total) under the tongue every 5 (five) minutes as needed for chest pain. 08/18/17   Minna Merritts, MD  omeprazole (PRILOSEC) 20 MG capsule Take 20 mg by mouth daily.     [provider]  Oxcarbazepine (TRILEPTAL) 300 MG tablet Take 1 tablet (300 mg total) by mouth 2 (two) times daily. 04/23/18   Ursula Alert, MD  RESTASIS 0.05 % ophthalmic emulsion Place 1 drop into both eyes daily as needed (for dryness).  09/16/15   [provider]  rOPINIRole (REQUIP) 0.5 MG tablet Take by mouth. 03/06/18   [provider]  sitaGLIPtin (JANUVIA) 100 MG tablet Take 100 mg by mouth daily. 09/24/16 09/24/17  [provider]  Spacer/Aero-Holding Josiah Lobo (E-Z SPACER) inhaler Reported on 01/15/2016 10/02/15   [provider]  SYNTHROID 25 MCG tablet  03/08/18   [provider]  TRULICITY 1.5 ZO/1.0RU SOPN Inject 1.5 mg into the skin once a week. Takes on Wednesday 06/24/18   [provider]  Vilazodone HCl (VIIBRYD) 40 MG TABS Take 1 tablet (40 mg total) by mouth daily. 04/23/18   Ursula Alert, MD  zolpidem (AMBIEN) 10 MG tablet Take 0.5-1 tablets (5-10 mg total) by mouth at bedtime as needed for  sleep. 06/17/18   Ursula Alert, MD    REVIEW OF SYSTEMS:  Review of Systems  Constitutional: Negative for chills, fever, malaise/fatigue and weight loss.  HENT: Negative for ear pain, hearing loss and tinnitus.   Eyes: Negative for blurred vision, double vision, pain and redness.  Respiratory: Negative for cough, hemoptysis and shortness of breath.   Cardiovascular: Positive for chest pain. Negative for palpitations, orthopnea and leg swelling.  Gastrointestinal: Positive for nausea and vomiting. Negative for abdominal pain, constipation and diarrhea.  Genitourinary: Negative for dysuria, frequency and hematuria.  Musculoskeletal: Negative for back pain, joint pain and neck pain.  Skin:       No acne, rash, or lesions  Neurological: Negative for dizziness, tremors, focal weakness and weakness.  Endo/Heme/Allergies: Negative for polydipsia. Does not bruise/bleed easily.  Psychiatric/Behavioral: Negative for depression. The patient is not nervous/anxious and does not have insomnia.      VITAL SIGNS:   Vitals:   07/04/18 1851 07/04/18 1854 07/04/18 2234  BP:  (!) 162/85 (!) 201/103  Pulse:  77 78  Resp:  18 18  Temp:  98 F (36.7 C)   TempSrc:  Oral   SpO2:  100% 100%  Weight: 72.1 kg    Height: '5\' 3"'  (1.6 m)     Wt Readings from Last 3 Encounters:  07/04/18 72.1 kg  08/18/17 70.6 kg  12/10/16 71.7 kg    PHYSICAL EXAMINATION:  Physical Exam  Vitals reviewed. Constitutional: She is oriented to person, place, and time. She appears well-developed and well-nourished. No distress.  HENT:  Head: Normocephalic and atraumatic.  Mouth/Throat: Oropharynx is clear and moist.  Eyes: Pupils are equal, round, and reactive to light. Conjunctivae and EOM are normal. No scleral icterus.  Neck: Normal range of motion. Neck supple. No JVD present. No  thyromegaly present.  Cardiovascular: Normal rate, regular rhythm and intact distal pulses. Exam reveals no gallop and no friction rub.   No murmur heard. Respiratory: Effort normal and breath sounds normal. No respiratory distress. She has no wheezes. She has no rales.  GI: Soft. Bowel sounds are normal. She exhibits no distension. There is tenderness (epigastric).  Musculoskeletal: Normal range of motion. She exhibits no edema.  No arthritis, no gout  Lymphadenopathy:    She has no cervical adenopathy.  Neurological: She is alert and oriented to person, place, and time. No cranial nerve deficit.  No dysarthria, no aphasia  Skin: Skin is warm and dry. No rash noted. No erythema.  Psychiatric: She has a normal mood and affect. Her behavior is normal. Judgment and thought content normal.    LABORATORY PANEL:   CBC Recent Labs  Lab 07/04/18 1854  WBC 6.9  HGB 14.5  HCT 42.1  PLT 319   ------------------------------------------------------------------------------------------------------------------  Chemistries  Recent Labs  Lab 07/04/18 1854  NA 124*  K 4.1  CL 87*  CO2 26  GLUCOSE 155*  BUN 8  CREATININE 0.61  CALCIUM 9.6   ------------------------------------------------------------------------------------------------------------------  Cardiac Enzymes Recent Labs  Lab 07/04/18 1854  TROPONINI <0.03   ------------------------------------------------------------------------------------------------------------------  RADIOLOGY:  Dg Chest 2 View  Result Date: 07/04/2018 CLINICAL DATA:  Left chest, shoulder and arm pain for several days. EXAM: CHEST - 2 VIEW COMPARISON:  Chest radiographs dated 05/31/2013. FINDINGS: Normal sized heart. Clear lungs. Post CABG changes. Coronary artery stent. Mild thoracic spine degenerative changes. IMPRESSION: No acute abnormality. Electronically Signed   By: Claudie Revering M.D.   On: 07/04/2018 20:07    EKG:   Orders placed or performed during the hospital encounter of 07/04/18  . EKG 12-Lead  . EKG 12-Lead  . ED EKG within 10 minutes  . ED EKG within 10 minutes     IMPRESSION AND PLAN:  Principal Problem:   Hyponatremia -[place] due to her significant nausea and vomiting.  We will hydrate her with IV saline, monitor sodium levels every 6 hours. Active Problems:   Nausea vomiting and diarrhea -perhaps related to starting Trulicity, versus viral gastroenteritis.  PRN antiemetics, IV fluids as above   CAD (coronary artery disease) -continue home meds, cycle cardiac enzymes given her complaint of chest pain, although this also may be due to her significant vomiting.  If her enzymes are abnormal, could consider further cardiac work-up at that time   HTN (hypertension) -uncontrolled, continue home meds, additional PRN antihypertensives to bring blood pressure less than 485 systolic today, and less than 462 systolic by tomorrow   Anxiety -home dose anxiolytics   Diabetes mellitus (HCC) -sliding scale insulin with corresponding glucose checks   GERD (gastroesophageal reflux disease) -Home dose PPI   HLD (hyperlipidemia) -Home dose antilipid   Chart review performed and case discussed with ED provider. Labs, imaging and/or ECG reviewed by provider and discussed with patient/family. Management plans discussed with the patient and/or family.  DVT PROPHYLAXIS: SubQ lovenox   GI PROPHYLAXIS:  PPI   ADMISSION STATUS: Inpatient     CODE STATUS: Full Code Status History    Date Active Date Inactive Code Status Order ID Comments User Context   12/10/2016 1617 12/12/2016 2108 Full Code [id number]  Ditty, Kevan Ny, MD Inpatient      TOTAL TIME TAKING CARE OF THIS PATIENT: 45 minutes.   Jennifer [name] [place] 07/04/2018, 10:35 PM  [employer]  [phone]  CC: Primary  care physician; Valera Castle, MD  Note:  This document was prepared using Dragon voice recognition software and may include unintentional dictation errors.

## 2018-07-04 NOTE — ED Triage Notes (Signed)
Pt states L sided CP with L shoulder and arm pain x few days. Since Wednesday. Hx triple bypass surgery 5-6 years ago. Hx HTN.   Alert, oriented, in wheelchair.  Also c/o HA and not being able to eat.

## 2018-07-05 ENCOUNTER — Other Ambulatory Visit: Payer: Self-pay

## 2018-07-05 LAB — CBC
HCT: 39.4 % (ref 35.0–47.0)
Hemoglobin: 13.5 g/dL (ref 12.0–16.0)
MCH: 29 pg (ref 26.0–34.0)
MCHC: 34.3 g/dL (ref 32.0–36.0)
MCV: 84.4 fL (ref 80.0–100.0)
Platelets: 326 10*3/uL (ref 150–440)
RBC: 4.67 MIL/uL (ref 3.80–5.20)
RDW: 14.6 % — AB (ref 11.5–14.5)
WBC: 6.7 10*3/uL (ref 3.6–11.0)

## 2018-07-05 LAB — GASTROINTESTINAL PANEL BY PCR, STOOL (REPLACES STOOL CULTURE)

## 2018-07-05 LAB — C DIFFICILE QUICK SCREEN W PCR REFLEX
C DIFFICILE (CDIFF) INTERP: NOT DETECTED
C Diff antigen: NEGATIVE
C Diff toxin: NEGATIVE

## 2018-07-05 LAB — GLUCOSE, CAPILLARY
GLUCOSE-CAPILLARY: 170 mg/dL — AB (ref 70–99)
GLUCOSE-CAPILLARY: 129 mg/dL — AB (ref 70–99)
GLUCOSE-CAPILLARY: 127 mg/dL — AB (ref 70–99)
Glucose-Capillary: 131 mg/dL — ABNORMAL HIGH (ref 70–99)
Glucose-Capillary: 151 mg/dL — ABNORMAL HIGH (ref 70–99)

## 2018-07-05 LAB — MAGNESIUM: Magnesium: 1.7 mg/dL (ref 1.7–2.4)

## 2018-07-05 LAB — OCCULT BLOOD X 1 CARD TO LAB, STOOL: Fecal Occult Bld: NEGATIVE

## 2018-07-05 LAB — BASIC METABOLIC PANEL
Anion gap: 10 (ref 5–15)
BUN: 7 mg/dL — AB (ref 8–23)
CHLORIDE: 90 mmol/L — AB (ref 98–111)
CO2: 25 mmol/L (ref 22–32)
Calcium: 9.1 mg/dL (ref 8.9–10.3)
Creatinine, Ser: 0.48 mg/dL (ref 0.44–1.00)
GFR calc Af Amer: >60 mL/min (ref >60)
GFR calc non Af Amer: >60 mL/min (ref >60)
Glucose, Bld: 124 mg/dL — ABNORMAL HIGH (ref 70–99)
POTASSIUM: 3.3 mmol/L — AB (ref 3.5–5.1)
Sodium: 125 mmol/L — ABNORMAL LOW (ref 135–145)

## 2018-07-05 LAB — LIPASE, BLOOD: LIPASE: 49 U/L (ref 11–51)

## 2018-07-05 LAB — CREATININE, SERUM
CREATININE: 0.51 mg/dL (ref 0.44–1.00)
GFR calc Af Amer: >60 mL/min (ref >60)

## 2018-07-05 LAB — TROPONIN I: Troponin I: <0.03 ng/mL (ref <0.03)

## 2018-07-05 LAB — SODIUM
SODIUM: 127 mmol/L — AB (ref 135–145)
SODIUM: 139 mmol/L (ref 135–145)
Sodium: 127 mmol/L — ABNORMAL LOW (ref 135–145)

## 2018-07-05 MED ORDER — ROPINIROLE HCL 1 MG PO TABS
1.0000 mg | ORAL_TABLET | Freq: Every day | ORAL | Status: DC
  Administered 2018-07-05 (×2): 1 mg via ORAL
  Filled 2018-07-05 (×2): qty 1

## 2018-07-05 MED ORDER — PANTOPRAZOLE SODIUM 40 MG PO TBEC
40.0000 mg | DELAYED_RELEASE_TABLET | Freq: Every day | ORAL | Status: DC
  Administered 2018-07-05 – 2018-07-06 (×2): 40 mg via ORAL
  Filled 2018-07-05 (×2): qty 1

## 2018-07-05 MED ORDER — CARVEDILOL 12.5 MG PO TABS
12.5000 mg | ORAL_TABLET | Freq: Two times a day (BID) | ORAL | Status: DC
  Administered 2018-07-05 – 2018-07-06 (×3): 12.5 mg via ORAL
  Filled 2018-07-05 (×3): qty 1

## 2018-07-05 MED ORDER — SODIUM CHLORIDE 0.9 % IV SOLN
INTRAVENOUS | Status: AC
  Administered 2018-07-05: 12:00:00 via INTRAVENOUS

## 2018-07-05 MED ORDER — VILAZODONE HCL 40 MG PO TABS
40.0000 mg | ORAL_TABLET | Freq: Every day | ORAL | Status: DC
  Administered 2018-07-05 – 2018-07-06 (×2): 40 mg via ORAL
  Filled 2018-07-05 (×3): qty 1

## 2018-07-05 MED ORDER — LEVOTHYROXINE SODIUM 25 MCG PO TABS
25.0000 ug | ORAL_TABLET | Freq: Every day | ORAL | Status: DC
  Administered 2018-07-05 – 2018-07-06 (×2): 25 ug via ORAL
  Filled 2018-07-05 (×2): qty 1

## 2018-07-05 MED ORDER — ZOLPIDEM TARTRATE 5 MG PO TABS
5.0000 mg | ORAL_TABLET | Freq: Every evening | ORAL | Status: DC | PRN
  Administered 2018-07-05 (×2): 5 mg via ORAL
  Filled 2018-07-05 (×2): qty 1

## 2018-07-05 MED ORDER — ACETAMINOPHEN 650 MG RE SUPP: 650.0000 mg | Freq: Four times a day (QID) | RECTAL | Status: DC | PRN

## 2018-07-05 MED ORDER — PNEUMOCOCCAL VAC POLYVALENT 25 MCG/0.5ML IJ INJ: 0.5000 mL | INJECTION | INTRAMUSCULAR | Status: DC

## 2018-07-05 MED ORDER — ENOXAPARIN SODIUM 40 MG/0.4ML ~~LOC~~ SOLN
40.0000 mg | SUBCUTANEOUS | Status: DC
  Administered 2018-07-05: 40 mg via SUBCUTANEOUS
  Filled 2018-07-05: qty 0.4

## 2018-07-05 MED ORDER — INSULIN ASPART 100 UNIT/ML ~~LOC~~ SOLN: 0.0000 [IU] | Freq: Every day | SUBCUTANEOUS | Status: DC

## 2018-07-05 MED ORDER — CARISOPRODOL 350 MG PO TABS
350.0000 mg | ORAL_TABLET | Freq: Four times a day (QID) | ORAL | Status: DC | PRN
  Administered 2018-07-05: 350 mg via ORAL
  Filled 2018-07-05: qty 1

## 2018-07-05 MED ORDER — INFLUENZA VAC SPLIT QUAD 0.5 ML IM SUSY: 0.5000 mL | PREFILLED_SYRINGE | INTRAMUSCULAR | Status: DC

## 2018-07-05 MED ORDER — CARIPRAZINE HCL 1.5 MG PO CAPS
1.5000 mg | ORAL_CAPSULE | Freq: Every day | ORAL | Status: DC
  Administered 2018-07-05: 1.5 mg via ORAL
  Filled 2018-07-05 (×2): qty 1

## 2018-07-05 MED ORDER — ATORVASTATIN CALCIUM 20 MG PO TABS
80.0000 mg | ORAL_TABLET | Freq: Every day | ORAL | Status: DC
  Administered 2018-07-05 – 2018-07-06 (×2): 80 mg via ORAL
  Filled 2018-07-05 (×2): qty 4

## 2018-07-05 MED ORDER — OXYCODONE HCL 5 MG PO TABS
5.0000 mg | ORAL_TABLET | Freq: Four times a day (QID) | ORAL | Status: DC | PRN
  Administered 2018-07-06 (×2): 5 mg via ORAL
  Filled 2018-07-05 (×2): qty 1

## 2018-07-05 MED ORDER — AMLODIPINE BESYLATE 5 MG PO TABS
5.0000 mg | ORAL_TABLET | Freq: Two times a day (BID) | ORAL | Status: DC
  Administered 2018-07-05 – 2018-07-06 (×4): 5 mg via ORAL
  Filled 2018-07-05 (×4): qty 1

## 2018-07-05 MED ORDER — CYCLOSPORINE 0.05 % OP EMUL: 1.0000 [drp] | Freq: Every day | OPHTHALMIC | Status: DC | PRN

## 2018-07-05 MED ORDER — ACETAMINOPHEN 325 MG PO TABS
650.0000 mg | ORAL_TABLET | Freq: Four times a day (QID) | ORAL | Status: DC | PRN
  Administered 2018-07-05 (×2): 650 mg via ORAL
  Filled 2018-07-05 (×2): qty 2

## 2018-07-05 MED ORDER — OXCARBAZEPINE 300 MG PO TABS
300.0000 mg | ORAL_TABLET | Freq: Two times a day (BID) | ORAL | Status: DC
  Administered 2018-07-05: 300 mg via ORAL
  Filled 2018-07-05 (×3): qty 1

## 2018-07-05 MED ORDER — INSULIN ASPART 100 UNIT/ML ~~LOC~~ SOLN
0.0000 [IU] | Freq: Three times a day (TID) | SUBCUTANEOUS | Status: DC
  Administered 2018-07-05: 2 [IU] via SUBCUTANEOUS
  Administered 2018-07-05 – 2018-07-06 (×3): 1 [IU] via SUBCUTANEOUS
  Filled 2018-07-05 (×4): qty 1

## 2018-07-05 MED ORDER — POTASSIUM CHLORIDE CRYS ER 20 MEQ PO TBCR
40.0000 meq | EXTENDED_RELEASE_TABLET | Freq: Once | ORAL | Status: AC
  Administered 2018-07-05: 40 meq via ORAL
  Filled 2018-07-05: qty 2

## 2018-07-05 MED ORDER — MAGNESIUM SULFATE 2 GM/50ML IV SOLN
2.0000 g | Freq: Once | INTRAVENOUS | Status: AC
  Administered 2018-07-05: 2 g via INTRAVENOUS
  Filled 2018-07-05: qty 50

## 2018-07-05 MED ORDER — PREGABALIN 50 MG PO CAPS
50.0000 mg | ORAL_CAPSULE | Freq: Every day | ORAL | Status: DC
  Administered 2018-07-05 – 2018-07-06 (×2): 50 mg via ORAL
  Filled 2018-07-05 (×2): qty 1

## 2018-07-05 MED ORDER — LISINOPRIL 20 MG PO TABS
20.0000 mg | ORAL_TABLET | Freq: Every day | ORAL | Status: DC
  Administered 2018-07-05 – 2018-07-06 (×2): 20 mg via ORAL
  Filled 2018-07-05 (×2): qty 1

## 2018-07-05 NOTE — Plan of Care (Signed)
  Problem: Education: Goal: Knowledge of General Education information will improve Description Including pain rating scale, medication(s)/side effects and non-pharmacologic comfort measures Outcome: Progressing   Problem: Clinical Measurements: Goal: Ability to maintain clinical measurements within normal limits will improve Outcome: Progressing Goal: Diagnostic test results will improve Outcome: Progressing Goal: Cardiovascular complication will be avoided Outcome: Progressing   Problem: Activity: Goal: Risk for activity intolerance will decrease Outcome: Progressing   Problem: Nutrition: Goal: Adequate nutrition will be maintained Outcome: Progressing   Problem: Coping: Goal: Level of anxiety will decrease Outcome: Progressing   Problem: Elimination: Goal: Will not experience complications related to bowel motility Outcome: Progressing Goal: Will not experience complications related to urinary retention Outcome: Progressing   Problem: Pain Managment: Goal: General experience of comfort will improve Outcome: Progressing   Problem: Safety: Goal: Ability to remain free from injury will improve Outcome: Progressing   Problem: Skin Integrity: Goal: Risk for impaired skin integrity will decrease Outcome: Progressing

## 2018-07-05 NOTE — Plan of Care (Signed)
No complaints of N/V/D since arrival to floor.

## 2018-07-05 NOTE — Progress Notes (Signed)
Text page to MD: Jennifer [name], room 246, Pt requesting her Cariprazine 1.5mg  and Vilazodone 40mg  for depression and her Carisoprodol 350mg  q6hPRN muscle spasms be ordered.   Return call from MD: all meds ordered

## 2018-07-05 NOTE — Progress Notes (Addendum)
Pharmacy Electrolyte Monitoring Consult:  Pharmacy consulted to assist in monitoring and replacing electrolytes in this 62 y.o. female admitted on 07/04/2018 with Chest Pain   Labs:  Sodium (mmol/L)  Date Value  07/05/2018 127 (L)  08/13/2014 136   Potassium (mmol/L)  Date Value  07/05/2018 3.3 (L)  08/13/2014 3.6   Magnesium (mg/dL)  Date Value  12/08/2013 2.0   Calcium (mg/dL)  Date Value  07/05/2018 9.1   Calcium, Total (mg/dL)  Date Value  08/13/2014 8.9   Albumin (g/dL)  Date Value  12/05/2017 4.9  12/08/2013 4.8    Assessment/Plan: K 3.3 - KCl 40 mEq PO x1 ordered by MD MD has ordered add on Mag  Will follow up CMP ordered for AM  Pharmacy will continue to follow  Jennifer [name] 07/05/2018 2:36 PM                      Addendum - Mag level = 1.7 Will order mag sulfate 2g IV x1 since K is low

## 2018-07-05 NOTE — Progress Notes (Signed)
[facility] [place] NAME: Jennifer [name]    MR#:  [id number]  DATE OF BIRTH:  1956-08-16  SUBJECTIVE:  CHIEF COMPLAINT: Patient nausea and vomiting improved.  Is still have some abdominal discomfort and reporting chronic diarrhea which is watery in nature Patient had colonoscopy done several years ago  REVIEW OF SYSTEMS:  CONSTITUTIONAL: No fever, fatigue or weakness.  EYES: No blurred or double vision.  EARS, NOSE, AND THROAT: No tinnitus or ear pain.  RESPIRATORY: No cough, shortness of breath, wheezing or hemoptysis.  CARDIOVASCULAR: No chest pain, orthopnea, edema.  GASTROINTESTINAL: No nausea, vomiting, reporting chronic diarrhea, some abdominal pain.  GENITOURINARY: No dysuria, hematuria.  ENDOCRINE: No polyuria, nocturia,  HEMATOLOGY: No anemia, easy bruising or bleeding SKIN: No rash or lesion. MUSCULOSKELETAL: No joint pain or arthritis.   NEUROLOGIC: No tingling, numbness, weakness.  PSYCHIATRY: No anxiety or depression.   DRUG ALLERGIES:   Allergies  Allergen Reactions  . No Known Allergies     VITALS:  Blood pressure 131/78, pulse 84, temperature 98.3 F (36.8 C), temperature source Oral, resp. rate 18, height 5\' 3"  (1.6 m), weight 72.3 kg, SpO2 97 %.  PHYSICAL EXAMINATION:  GENERAL:  62 y.o.-year-old patient lying in the bed with no acute distress.  EYES: Pupils equal, round, reactive to light and accommodation. No scleral icterus. Extraocular muscles intact.  HEENT: Head atraumatic, normocephalic. Oropharynx and nasopharynx clear.  NECK:  Supple, no jugular venous distention. No thyroid enlargement, no tenderness.  LUNGS: Normal breath sounds bilaterally, no wheezing, rales,rhonchi or crepitation. No use of accessory muscles of respiration.  CARDIOVASCULAR: S1, S2 normal. No murmurs, rubs, or gallops.  ABDOMEN: Soft, nontender, nondistended. Bowel sounds present.   EXTREMITIES: No pedal edema, cyanosis, or  clubbing.  NEUROLOGIC: Cranial nerves II through XII are intact. Muscle strength 5/5 in all extremities. Sensation intact. Gait not checked.  PSYCHIATRIC: The patient is alert and oriented x 3.  SKIN: No obvious rash, lesion, or ulcer.    LABORATORY PANEL:   CBC Recent Labs  Lab 07/05/18 0107  WBC 6.7  HGB 13.5  HCT 39.4  PLT 326   ------------------------------------------------------------------------------------------------------------------  Chemistries  Recent Labs  Lab 07/05/18 0107 07/05/18 1058  NA 125* 127*  K 3.3*  --   CL 90*  --   CO2 25  --   GLUCOSE 124*  --   BUN 7*  --   CREATININE 0.51  0.48  --   CALCIUM 9.1  --    ------------------------------------------------------------------------------------------------------------------  Cardiac Enzymes Recent Labs  Lab 07/05/18 0107  TROPONINI <0.03   ------------------------------------------------------------------------------------------------------------------  RADIOLOGY:  Dg Chest 2 View  Result Date: 07/04/2018 CLINICAL DATA:  Left chest, shoulder and arm pain for several days. EXAM: CHEST - 2 VIEW COMPARISON:  Chest radiographs dated 05/31/2013. FINDINGS: Normal sized heart. Clear lungs. Post CABG changes. Coronary artery stent. Mild thoracic spine degenerative changes. IMPRESSION: No acute abnormality. Electronically Signed   By: Claudie Revering M.D.   On: 07/04/2018 20:07    EKG:   Orders placed or performed during the hospital encounter of 07/04/18  . EKG 12-Lead  . EKG 12-Lead  . ED EKG within 10 minutes  . ED EKG within 10 minutes    ASSESSMENT AND PLAN:    #Hyponatremia secondary to intractable nausea vomiting and diarrhea Continue IV fluids Monitor serial sodiums Sodium 123-125-127 Patient is mentating fine  #Nausea vomiting and diarrhea-could be viral/the newly introduced Trulicity for diabetes Symptomatic  management, antiemetics, IV fluids Discontinue Trulicity for now Check  stool for C. difficile toxin and GI panel Nausea and vomiting are resolved If no improvement in diarrhea will consider GI consult PPI Lipase is normal  #Hypokalemia replete and recheck and also check magnesium  #Diabetes mellitus Hold recently started Trulicity Sliding scale insulin  #Essential hypertension continue home medication Norvasc, lisinopril and Coreg and monitor blood pressure closely  #Coronary artery disease continue home medication Coreg, Lipitor  #Hyperlipidemia statin     All the records are reviewed and case discussed with Care Management/Social Workerr. Management plans discussed with the patient, family and they are in agreement.  CODE STATUS: fc  TOTAL TIME TAKING CARE OF THIS PATIENT: 63minutes.   POSSIBLE D/C IN 2 DAYS, DEPENDING ON CLINICAL CONDITION.  Note: This dictation was prepared with Dragon dictation along with smaller phrase technology. Any transcriptional errors that result from this process are unintentional.   Nicholes Mango M.D on 07/05/2018 at 2:11 PM  Between 7am to 6pm - Pager - [phone] After 6pm go to [email or web address] - password [email or web address] [facility]  Office  [phone]  CC: Primary care physician; Valera Castle, MD

## 2018-07-06 LAB — COMPREHENSIVE METABOLIC PANEL
ALK PHOS: 87 U/L (ref 38–126)
ALT: 41 U/L (ref 0–44)
ANION GAP: 6 (ref 5–15)
AST: 73 U/L — ABNORMAL HIGH (ref 15–41)
Albumin: 3.9 g/dL (ref 3.5–5.0)
BILIRUBIN TOTAL: 0.4 mg/dL (ref 0.3–1.2)
BUN: 9 mg/dL (ref 8–23)
CALCIUM: 8.8 mg/dL — AB (ref 8.9–10.3)
CO2: 23 mmol/L (ref 22–32)
CREATININE: 0.65 mg/dL (ref 0.44–1.00)
Chloride: 110 mmol/L (ref 98–111)
GFR calc non Af Amer: >60 mL/min (ref >60)
Glucose, Bld: 152 mg/dL — ABNORMAL HIGH (ref 70–99)
Potassium: 4.3 mmol/L (ref 3.5–5.1)
Sodium: 139 mmol/L (ref 135–145)
Total Protein: 6.5 g/dL (ref 6.5–8.1)

## 2018-07-06 LAB — GLUCOSE, CAPILLARY
GLUCOSE-CAPILLARY: 129 mg/dL — AB (ref 70–99)
Glucose-Capillary: 141 mg/dL — ABNORMAL HIGH (ref 70–99)

## 2018-07-06 LAB — CBC
HEMATOCRIT: 38.3 % (ref 35.0–47.0)
HEMOGLOBIN: 13.3 g/dL (ref 12.0–16.0)
MCH: 30.2 pg (ref 26.0–34.0)
MCHC: 34.9 g/dL (ref 32.0–36.0)
MCV: 86.5 fL (ref 80.0–100.0)
Platelets: 285 10*3/uL (ref 150–440)
RBC: 4.42 MIL/uL (ref 3.80–5.20)
RDW: 14.8 % — ABNORMAL HIGH (ref 11.5–14.5)
WBC: 6.6 10*3/uL (ref 3.6–11.0)

## 2018-07-06 LAB — HIV ANTIBODY (ROUTINE TESTING W REFLEX): HIV Screen 4th Generation wRfx: NONREACTIVE

## 2018-07-06 LAB — MAGNESIUM: Magnesium: 2.1 mg/dL (ref 1.7–2.4)

## 2018-07-06 NOTE — Progress Notes (Signed)
Pharmacy Electrolyte Monitoring Consult:  Pharmacy consulted to assist in monitoring and replacing electrolytes in this 62 y.o. female admitted on 07/04/2018 with nausea/vomitting/diarrhea  Labs:  Sodium (mmol/L)  Date Value  07/06/2018 139  08/13/2014 136   Potassium (mmol/L)  Date Value  07/06/2018 4.3  08/13/2014 3.6   Magnesium (mg/dL)  Date Value  07/06/2018 2.1  12/08/2013 2.0   Calcium (mg/dL)  Date Value  07/06/2018 8.8 (L)   Calcium, Total (mg/dL)  Date Value  08/13/2014 8.9   Albumin (g/dL)  Date Value  07/06/2018 3.9  12/08/2013 4.8    Assessment/Plan: K=4.3, Mg=2.1 No supplementation needed. Appears symptoms are improving. Will check a BMP in AM, if stable, pharmacy will sign off.  Ramond Dial, Pharm.D, BCPS Clinical Pharmacist

## 2018-07-06 NOTE — Plan of Care (Signed)
  Problem: Education: Goal: Knowledge of General Education information will improve Description Including pain rating scale, medication(s)/side effects and non-pharmacologic comfort measures Outcome: Progressing   Problem: Clinical Measurements: Goal: Ability to maintain clinical measurements within normal limits will improve Outcome: Progressing Goal: Diagnostic test results will improve Outcome: Progressing   Problem: Pain Managment: Goal: General experience of comfort will improve Outcome: Progressing   

## 2018-07-06 NOTE — Discharge Summary (Signed)
[facility] NAME: Jennifer [name]    MR#:  [id number]  DATE OF BIRTH:  30-Jun-1956  DATE OF ADMISSION:  07/04/2018 ADMITTING PHYSICIAN: Lance Coon, MD  DATE OF DISCHARGE: 07/06/18  PRIMARY CARE PHYSICIAN: Valera Castle, MD    ADMISSION DIAGNOSIS:  Gastroenteritis [K52.9] Hyponatremia [E87.1]  DISCHARGE DIAGNOSIS:  Principal Problem:   Hyponatremia Active Problems:   CAD (coronary artery disease)   HTN (hypertension)   Anxiety   Diabetes mellitus ([place])   Essential (primary) hypertension   GERD (gastroesophageal reflux disease)   HLD (hyperlipidemia)   Nausea vomiting and diarrhea   SECONDARY DIAGNOSIS:   Past Medical History:  Diagnosis Date  . Anemia 1985   bled during a cervical biopsy, led to bleed transfusion   . Anxiety   . Arthritis    pt. reports that her neck is stiff   . Bipolar depression ([place])   . Blood dyscrasia    reported hx [name] '85; saw hematologist Dr. Janese Banks 10/2016, VWD work-up WNL, not felt to have a primary bleeding disorder   . CAD (coronary artery disease)   . 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 COPD (chronic obstructive pulmonary disease) ([place])    dr. Gar Ponto [facility]    PCP  . Depression   . Depression with anxiety   . Diabetes mellitus   . Dyspnea    W/ EXERTION   . Enlarged liver   . Family history of adverse reaction to anesthesia    father woke up slowly  . Fibromyalgia   . Generalized anxiety disorder   . GERD (gastroesophageal reflux disease)   . Hyperlipidemia   . Hypertension   . Low back pain   . Migraines   . Neuropathy   . Persistent disorder of initiating or maintaining sleep   . Von Willebrand disease [facility])     HOSPITAL COURSE:  Jennifer [name]  is a 62 y.o. female who presents with chief complaint as above.  Patient presents with several days of nausea and vomiting.  Today she developed some  epigastric and chest discomfort.  She does have a history of coronary disease.  She states that she just started a new diabetes medication, Trulicity, on Wednesday.  She developed GI upset shortly after.  Today in the ED she is found to have normal work-up except for low sodium at 124.  Hospitalist were called for admission  #Hyponatremia secondary to intractable nausea vomiting and diarrhea- Improved with IV fluids sodium is back to normal   #Nausea vomiting and diarrhea-could be viral/the newly introduced Trulicity for diabetes Symptomatic management, antiemetics, IV fluids provided patient clinically improved Discontinue Trulicity for now Stool negative for C. difficile toxin and GI panel Nausea and vomiting are resolved Patient reports chronic diarrhea and recommended outpatient follow-up PPI Lipase is normal  #Hypokalemia repleted and magnesium at 2.1  #Diabetes mellitus Hold recently started Trulicity, discontinue metformin which may be the cause for her chronic diarrhea Sliding scale insulin provided during the hospital course Resume 70/30 home dose insulin  #Essential hypertension continue home medication Norvasc, lisinopril and Coreg and monitor blood pressure closely  #Coronary artery disease continue home medication Coreg, Lipitor  #Hyperlipidemia statin  DISCHARGE CONDITIONS:   stable  CONSULTS OBTAINED:     PROCEDURES none   DRUG ALLERGIES:   Allergies  Allergen Reactions  . No Known  Allergies     DISCHARGE MEDICATIONS:   Allergies as of 07/06/2018      Reactions   No Known Allergies       Medication List    STOP taking these medications   metFORMIN 750 MG 24 hr tablet Commonly known as:  GLUCOPHAGE-XR   TRULICITY 1.5 VI/1.5PP Sopn Generic drug:  Dulaglutide     TAKE these medications   albuterol-ipratropium 18-103 MCG/ACT inhaler Commonly known as:  COMBIVENT Inhale 2 puffs into the lungs daily as needed for wheezing or shortness of  breath.   amLODipine 5 MG tablet Commonly known as:  NORVASC Take 1 tablet (5 mg total) by mouth 2 (two) times daily.   atorvastatin 80 MG tablet Commonly known as:  LIPITOR Take 1 tablet (80 mg total) by mouth daily.   cariprazine capsule Commonly known as:  VRAYLAR Take 1 capsule (1.5 mg total) by mouth daily.   carisoprodol 350 MG tablet Commonly known as:  SOMA Take 1 tablet (350 mg total) by mouth every 6 (six) hours as needed for muscle spasms.   carvedilol 12.5 MG tablet Commonly known as:  COREG Take 1 tablet (12.5 mg total) by mouth 2 (two) times daily with a meal.   E-Z SPACER inhaler Reported on 01/15/2016   fluticasone 50 MCG/ACT nasal spray Commonly known as:  FLONASE Place 1 spray into both nostrils 2 (two) times daily as needed for allergies.   FREESTYLE FREEDOM LITE w/Device Kit Use 3 (three) times daily. 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 Dx Code: 250.00   FREESTYLE LITE test strip Generic drug:  glucose blood Use 3 (three) times daily   HUMULIN 70/30 KWIKPEN (70-30) 100 UNIT/ML PEN Generic drug:  Insulin Isophane & Regular Human Inject 10-15 Units into the skin 2 (two) times daily. 15 units in the morning and 10-12 units at bedtime   lisinopril 20 MG tablet Commonly known as:  PRINIVIL,ZESTRIL Take 1 tablet (20 mg total) by mouth daily.   LYRICA 50 MG capsule Generic drug:  pregabalin Take 50 mg by mouth daily.   mirtazapine 15 MG tablet Commonly known as:  REMERON Take 0.5 tablets (7.5 mg total) by mouth at bedtime.   nitroGLYCERIN 0.4 MG SL tablet Commonly known as:  NITROSTAT Place 1 tablet (0.4 mg total) under the tongue every 5 (five) minutes as needed for chest pain.   omeprazole 20 MG capsule Commonly known as:  PRILOSEC Take 20 mg by mouth daily.   Oxcarbazepine 300 MG tablet Commonly known as:  TRILEPTAL Take 1 tablet (300 mg total) by mouth 2 (two) times daily.   oxyCODONE 5 MG immediate release tablet Commonly known  as:  Oxy IR/ROXICODONE Take 5 mg by mouth every 6 (six) hours as needed for pain.   RESTASIS 0.05 % ophthalmic emulsion Generic drug:  cycloSPORINE Place 1 drop into both eyes daily as needed (for dryness).   rOPINIRole 0.5 MG tablet Commonly known as:  REQUIP Take 1 mg by mouth at bedtime.   sitaGLIPtin 100 MG tablet Commonly known as:  JANUVIA Take 100 mg by mouth daily.   SYNTHROID 25 MCG tablet Generic drug:  levothyroxine   Vilazodone HCl 40 MG Tabs Commonly known as:  VIIBRYD Take 1 tablet (40 mg total) by mouth daily.   zolpidem 10 MG tablet Commonly known as:  AMBIEN Take 0.5-1 tablets (5-10 mg total) by mouth at bedtime as needed for sleep. What changed:    how much to take  when  to take this        DISCHARGE INSTRUCTIONS:  Follow-up with primary care physician in 3 to 4 days Follow-up with gastroenterology in 2 weeks   DIET:  Cardiac diet and Diabetic diet  DISCHARGE CONDITION:  Stable  ACTIVITY:  Activity as tolerated  OXYGEN:  Home Oxygen: No.   Oxygen Delivery: room air  DISCHARGE LOCATION:  home   If you experience worsening of your admission symptoms, develop shortness of breath, life threatening emergency, suicidal or homicidal thoughts you must seek medical attention immediately by calling 911 or calling your MD immediately  if symptoms less severe.  You Must read complete instructions/literature along with all the possible adverse reactions/side effects for all the Medicines you take and that have been prescribed to you. Take any new Medicines after you have completely understood and accpet all the possible adverse reactions/side effects.   Please note  You were cared for by a hospitalist during your hospital stay. If you have any questions about your discharge medications or the care you received while you were in the hospital after you are discharged, you can call the unit and asked to speak with the hospitalist on call if the  hospitalist that took care of you is not available. Once you are discharged, your primary care physician will handle any further medical issues. Please note that NO REFILLS for any discharge medications will be authorized once you are discharged, as it is imperative that you return to your primary care physician (or establish a relationship with a primary care physician if you do not have one) for your aftercare needs so that they can reassess your need for medications and monitor your lab values.     Today  Chief Complaint  Patient presents with  . Chest Pain   Patient feels fine denies any nausea and vomiting has chronic diarrhea wants to see GI as an outpatient  ROS:  CONSTITUTIONAL: Denies fevers, chills. Denies any fatigue, weakness.  EYES: Denies blurry vision, double vision, eye pain. EARS, NOSE, THROAT: Denies tinnitus, ear pain, hearing loss. RESPIRATORY: Denies cough, wheeze, shortness of breath.  CARDIOVASCULAR: Denies chest pain, palpitations, edema.  GASTROINTESTINAL: Denies nausea, vomiting, diarrhea, abdominal pain. Denies bright red blood per rectum. GENITOURINARY: Denies dysuria, hematuria. ENDOCRINE: Denies nocturia or thyroid problems. HEMATOLOGIC AND LYMPHATIC: Denies easy bruising or bleeding. SKIN: Denies rash or lesion. MUSCULOSKELETAL: Denies pain in neck, back, shoulder, knees, hips or arthritic symptoms.  NEUROLOGIC: Denies paralysis, paresthesias.  PSYCHIATRIC: Denies anxiety or depressive symptoms.   VITAL SIGNS:  Blood pressure 139/80, pulse 74, temperature 97.9 F (36.6 C), temperature source Oral, resp. rate 18, height _0  (1.6 m), weight 72.3 kg, SpO2 96 %.  I/O:    Intake/Output Summary (Last 24 hours) at 07/06/2018 1205 Last data filed at 07/06/2018 1020 Gross per 24 hour  Intake 1740.31 ml  Output 2600 ml  Net -859.69 ml    PHYSICAL EXAMINATION:  GENERAL:  62 y.o.-year-old patient lying in the bed with no acute distress.  EYES: Pupils  equal, round, reactive to light and accommodation. No scleral icterus. Extraocular muscles intact.  HEENT: Head atraumatic, normocephalic. Oropharynx and nasopharynx clear.  NECK:  Supple, no jugular venous distention. No thyroid enlargement, no tenderness.  LUNGS: Normal breath sounds bilaterally, no wheezing, rales,rhonchi or crepitation. No use of accessory muscles of respiration.  CARDIOVASCULAR: S1, S2 normal. No murmurs, rubs, or gallops.  ABDOMEN: Soft, non-tender, non-distended. Bowel sounds present. No organomegaly or mass.  EXTREMITIES: No pedal  edema, cyanosis, or clubbing.  NEUROLOGIC: Cranial nerves II through XII are intact. Muscle strength 5/5 in all extremities. Sensation intact. Gait not checked.  PSYCHIATRIC: The patient is alert and oriented x 3.  SKIN: No obvious rash, lesion, or ulcer.   DATA REVIEW:   CBC Recent Labs  Lab 07/06/18 0444  WBC 6.6  HGB 13.3  HCT 38.3  PLT 285    Chemistries  Recent Labs  Lab 07/06/18 0444  NA 139  K 4.3  CL 110  CO2 23  GLUCOSE 152*  BUN 9  CREATININE 0.65  CALCIUM 8.8*  MG 2.1  AST 73*  ALT 41  ALKPHOS 87  BILITOT 0.4    Cardiac Enzymes Recent Labs  Lab 07/05/18 0107  TROPONINI <0.03    Microbiology Results  Results for orders placed or performed during the hospital encounter of 07/04/18  Gastrointestinal Panel by PCR , Stool     Status: None   Collection Time: 07/05/18  1:53 PM  Result Value Ref Range Status   Campylobacter species NOT DETECTED NOT DETECTED Final   Plesimonas shigelloides NOT DETECTED NOT DETECTED Final   Salmonella species NOT DETECTED NOT DETECTED Final   Yersinia enterocolitica NOT DETECTED NOT DETECTED Final   Vibrio species NOT DETECTED NOT DETECTED Final   Vibrio cholerae NOT DETECTED NOT DETECTED Final   Enteroaggregative E coli (EAEC) NOT DETECTED NOT DETECTED Final   Enteropathogenic E coli (EPEC) NOT DETECTED NOT DETECTED Final   Enterotoxigenic E coli (ETEC) NOT DETECTED  NOT DETECTED Final   Shiga like toxin producing E coli (STEC) NOT DETECTED NOT DETECTED Final   Shigella/Enteroinvasive E coli (EIEC) NOT DETECTED NOT DETECTED Final   Cryptosporidium NOT DETECTED NOT DETECTED Final   Cyclospora cayetanensis NOT DETECTED NOT DETECTED Final   Entamoeba histolytica NOT DETECTED NOT DETECTED Final   Giardia lamblia NOT DETECTED NOT DETECTED Final   Adenovirus F40/41 NOT DETECTED NOT DETECTED Final   Astrovirus NOT DETECTED NOT DETECTED Final   Norovirus GI/GII NOT DETECTED NOT DETECTED Final   Rotavirus A NOT DETECTED NOT DETECTED Final   Sapovirus (I, II, IV, and V) NOT DETECTED NOT DETECTED Final    Comment: Performed at [facility], [address] [place], [place] [address]  C Difficile Quick Screen w PCR reflex     Status: None   Collection Time: 07/05/18  1:53 PM  Result Value Ref Range Status   C Diff antigen NEGATIVE NEGATIVE Final   C Diff toxin NEGATIVE NEGATIVE Final   C Diff interpretation No C. difficile detected.  Final    Comment: Performed at [facility], [address] [place], [place] [address]    RADIOLOGY:  Dg Chest 2 View  Result Date: 07/04/2018 CLINICAL DATA:  Left chest, shoulder and arm pain for several days. EXAM: CHEST - 2 VIEW COMPARISON:  Chest radiographs dated 05/31/2013. FINDINGS: Normal sized heart. Clear lungs. Post CABG changes. Coronary artery stent. Mild thoracic spine degenerative changes. IMPRESSION: No acute abnormality. Electronically Signed   By: Claudie Revering M.D.   On: 07/04/2018 20:07    EKG:   Orders placed or performed during the hospital encounter of 07/04/18  . EKG 12-Lead  . EKG 12-Lead  . ED EKG within 10 minutes  . ED EKG within 10 minutes      Management plans discussed with the patient, family and they are in agreement.  CODE STATUS:     Code Status Orders  (From admission, onward)  Start     Ordered   07/05/18 0057  Full code  Continuous     07/05/18  0056        Code Status History    Date Active Date Inactive Code Status Order ID Comments User Context   12/10/2016 1617 12/12/2016 2108 Full Code [id number]  Ditty, Kevan Ny, MD Inpatient      TOTAL TIME TAKING CARE OF THIS PATIENT: 43  minutes.   Note: This dictation was prepared with Dragon dictation along with smaller phrase technology. Any transcriptional errors that result from this process are unintentional.   _0 @  on 07/06/2018 at 12:05 PM  Between 7am to 6pm - Pager - [phone]  After 6pm go to [email or web address] - password [email or web address] [facility]  Office  [phone]  CC: Primary care physician; Valera Castle, MD

## 2018-07-06 NOTE — Progress Notes (Signed)
Discharge instructions explained to pt/ verbalized an understanding/ iv and tele removed/ will transport off unit via wheelchair.  

## 2018-07-06 NOTE — Discharge Instructions (Signed)
Follow-up with primary care physician in 3 to 4 days Follow-up with gastroenterology in 2 weeks

## 2018-07-09 NOTE — Progress Notes (Signed)
   THERAPIST PROGRESS NOTE  Session Time: 38min  Participation Level: Active  Behavioral Response: NeatAlertAnxious  Type of Therapy: Individual Therapy  Treatment Goals addressed: Coping  Interventions: CBT and Motivational Interviewing  Summary: Jennifer [name] is a 62 y.o. female who presents with continued symptoms of her diagnosis.  Provided support for Patient as she discussed her current mood. Stressed the importance of mood stabilization through learned coping skills and medication management.  Encouraged Patient to focus on her strengths and the positive aspects.   Suicidal/Homicidal: No  Plan: Return again in 2 weeks.  Diagnosis: Axis I: Bipolar, Depressed and Generalized Anxiety Disorder    Axis II: No diagnosis    Lubertha South, LCSW 07/01/2018

## 2018-07-20 NOTE — Progress Notes (Signed)
   THERAPIST PROGRESS NOTE  Session Time: 35  Participation Level: Active  Behavioral Response: CasualAlertAnxious  Type of Therapy: Individual Therapy  Treatment Goals addressed: Coping  Interventions: Solution Focused  Summary: Jennifer [name] is a 62 y.o. female who presents with continued symptoms of her diagnosis.  Therapist met with Patient in an outpatient setting to assess current mood and assist with making progress towards goals through the use of therapeutic intervention. Therapist did a brief mood check, assessing anger, fear, disgust, excitement, happiness, and sadness.  Patient reports feeling "sad" most of the time.  She reports that her stressors shift from child to child to grandchild.  Normalized her feelings and assisted with establishing self awareness and "me-time."  Factors that contribute to client's ongoing depressive symptoms were discussed and include real and perceived feelings of isolation, criticism, rejection, shame and guilt.    Suicidal/Homicidal: No  Plan: Return again in 2 weeks.  Diagnosis: Axis I: Bipolar, Depressed    Axis II: No diagnosis    Lubertha South, LCSW 06/17/2018

## 2018-08-03 ENCOUNTER — Encounter: Payer: Self-pay | Admitting: Gastroenterology

## 2018-08-03 ENCOUNTER — Ambulatory Visit (INDEPENDENT_AMBULATORY_CARE_PROVIDER_SITE_OTHER): Admitting: Gastroenterology

## 2018-08-03 ENCOUNTER — Other Ambulatory Visit: Payer: Self-pay

## 2018-08-03 VITALS — BP 96/61 | HR 72 | Ht 63.0 in | Wt 163.6 lb

## 2018-08-03 DIAGNOSIS — R109 Unspecified abdominal pain: Secondary | ICD-10-CM | POA: Diagnosis not present

## 2018-08-03 DIAGNOSIS — R197 Diarrhea, unspecified: Secondary | ICD-10-CM

## 2018-08-03 MED ORDER — DICYCLOMINE HCL 10 MG PO CAPS: 10.0000 mg | ORAL_CAPSULE | Freq: Three times a day (TID) | ORAL | 0 refills | Status: DC

## 2018-08-03 NOTE — Addendum Note (Signed)
Addended by: Dorethea Clan on: 08/03/2018 02:46 PM   Modules accepted: Orders

## 2018-08-03 NOTE — Progress Notes (Signed)
[name] MD, MRCP(U.K) [address]  [place]  [place], [place] [address]  Main: [phone]  Fax: [phone]   Gastroenterology Consultation  Referring Provider:     Valera Castle, * Primary Care Physician:  Valera Castle, MD Primary Gastroenterologist:  Dr. [name]  Reason for Consultation:     Hospital discharge follow u[         HPI:   Jennifer [name] is a 62 y.o. y/o female referred for consultation & management  by Dr. Kym Groom, Guy Begin, MD.     She was discharged on 07/06/18 after being admitted with nause aand vomiting of a few days in duration.Was not seen by GI on admission. Mildly elevated Ast of 73 hemoglobin 13 grams  She says she has had diarrhea for year. Has a bowel movement 4-5 times a day , no blood. Watery , wakes in the night with a bowel movement . Losing control of her urine and stool. She has some gas and mixed with stool.   She has not had a colonoscopy in many years, colon cancer in her brother, she has not lost any weight . She does consume artificial sweetners in her ice tea- multiple sodas a day , no hard candy .   She has abdominal pain in the center of her abdomen , on and off for a year. She has the pain after a meal and has a bowel movement right after. She has had a history of bulemia in the past.     Past Medical History:  Diagnosis Date  . Anemia 1985   bled during a cervical biopsy, led to bleed transfusion   . Anxiety   . Arthritis    pt. reports that her neck is stiff   . Bipolar depression ([place])   . Blood dyscrasia    reported hx [name] '85; saw hematologist Dr. Janese Banks 10/2016, VWD work-up WNL, not felt to have a primary bleeding disorder   . CAD (coronary artery disease)   . 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 COPD (chronic obstructive pulmonary disease) ([place])    dr. Gar Ponto [facility]IMARY [facility]    PCP  . Depression   . Depression with anxiety   . Diabetes  mellitus   . Dyspnea    W/ EXERTION   . Enlarged liver   . Family history of adverse reaction to anesthesia    father woke up slowly  . Fibromyalgia   . Generalized anxiety disorder   . GERD (gastroesophageal reflux disease)   . Hyperlipidemia   . Hypertension   . Low back pain   . Migraines   . Neuropathy   . Persistent disorder of initiating or maintaining sleep   . Von Willebrand disease ([place])     Past Surgical History:  Procedure Laterality Date  . ABDOMINAL HYSTERECTOMY    . ANTERIOR LAT LUMBAR FUSION N/A 12/10/2016   Procedure: LUMBAR FOUR-FIVE  Anterior lateral lumbar interbody fusion with LUMBAR FOUR-FIVE  Posterolateral fusion/Re-operative laminectomy LUMBAR FIVE-SACRUM ONE Bilateral, Laminectomy at LUMBAR FOUR-FIVE , 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  and Left LUMBAR FOUR-FIVE  with Mazor;  Surgeon: Kevan Ny Ditty, MD;  Location: [facility];  Service: Neurosurgery;  Laterality: N/A;  . APPLICATION OF ROBOTIC ASSISTANCE FOR SPINAL PROCEDURE N/A 12/10/2016   Procedure: APPLICATION OF ROBOTIC ASSISTANCE FOR SPINAL PROCEDURE;  Surgeon: Kevan Ny Ditty, MD;  Location: [facility];  Service: Neurosurgery;  Laterality: N/A;  . BACK SURGERY  2007   lumbar  . BREAST IMPLANT REMOVAL    . CARDIAC CATHETERIZATION     CAD,PCI of LAD, Cypher stent 2.5-28mm  . CARDIAC CATHETERIZATION     PCI of mid-LAD in stent restenosis with a drug-eluting stent  . CARDIAC CATHETERIZATION  06/03/13   [facility];MID LAD 100 % STENOSIS; PRIOR STENT; MID RCA 40 % STENOSIS  . CORONARY ARTERY BYPASS GRAFT   08-12-2011   CABG x 3  . CORONARY STENT PLACEMENT    . LUMBAR FUSION     L1-S1  . LUMBAR PERCUTANEOUS PEDICLE SCREW 1 LEVEL N/A 12/10/2016   Procedure: Extension of pedicle screw fixation to LUMBAR FOUR;  Surgeon: Kevan Ny Ditty, MD;  Location: [facility];  Service: Neurosurgery;  Laterality: N/A;  . NASAL SINUS SURGERY  1997   tuirbinate reduction     Prior to Admission  medications   Medication Sig Start Date End Date Taking? Authorizing Provider  albuterol-ipratropium (COMBIVENT) 18-103 MCG/ACT inhaler Inhale 2 puffs into the lungs daily as needed for wheezing or shortness of breath.     [provider]  amLODipine (NORVASC) 5 MG tablet Take 1 tablet (5 mg total) by mouth 2 (two) times daily. 08/18/17   Minna Merritts, MD  atorvastatin (LIPITOR) 80 MG tablet Take 1 tablet (80 mg total) by mouth daily. 08/18/17   Minna Merritts, MD  Blood Glucose Monitoring Suppl (FREESTYLE FREEDOM LITE) W/DEVICE KIT Use 3 (three) times daily. 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 Dx Code: 250.00 07/19/15   [provider]  cariprazine (VRAYLAR) capsule Take 1 capsule (1.5 mg total) by mouth daily. 04/23/18   Ursula Alert, MD  carisoprodol (SOMA) 350 MG tablet Take 1 tablet (350 mg total) by mouth every 6 (six) hours as needed for muscle spasms. 12/12/16   Ditty, Kevan Ny, MD  carvedilol (COREG) 12.5 MG tablet Take 1 tablet (12.5 mg total) by mouth 2 (two) times daily with a meal. 08/18/17   Gollan, Kathlene November, MD  fluticasone (FLONASE) 50 MCG/ACT nasal spray Place 1 spray into both nostrils 2 (two) times daily as needed for allergies. 07/21/15   [provider]  glucose blood (FREESTYLE LITE) test strip Use 3 (three) times daily 02/05/18 02/06/19  [provider]  HUMULIN 70/30 KWIKPEN (70-30) 100 UNIT/ML PEN Inject 10-15 Units into the skin 2 (two) times daily. 15 units in the morning and 10-12 units at bedtime 05/07/16   [provider]  lisinopril (PRINIVIL,ZESTRIL) 20 MG tablet Take 1 tablet (20 mg total) by mouth daily. 08/18/17   Minna Merritts, MD  LYRICA 50 MG capsule Take 50 mg by mouth daily.  03/30/18   [provider]  mirtazapine (REMERON) 15 MG tablet Take 0.5 tablets (7.5 mg total) by mouth at bedtime. 06/02/18   Ursula Alert, MD  nitroGLYCERIN (NITROSTAT) 0.4 MG SL tablet Place 1 tablet (0.4 mg  total) under the tongue every 5 (five) minutes as needed for chest pain. 08/18/17   Minna Merritts, MD  omeprazole (PRILOSEC) 20 MG capsule Take 20 mg by mouth daily.     [provider]  Oxcarbazepine (TRILEPTAL) 300 MG tablet Take 1 tablet (300 mg total) by mouth 2 (two) times daily. 04/23/18   Ursula Alert, MD  oxyCODONE (OXY IR/ROXICODONE) 5 MG immediate release tablet Take 5 mg by mouth every 6 (six) hours as needed for pain. 06/22/18  [provider]  RESTASIS 0.05 % ophthalmic emulsion Place 1 drop into both eyes daily as needed (for dryness).  09/16/15   [provider]  rOPINIRole (REQUIP) 0.5 MG tablet Take 1 mg by mouth at bedtime.  03/06/18   [provider]  sitaGLIPtin (JANUVIA) 100 MG tablet Take 100 mg by mouth daily. 09/24/16 09/24/17  [provider]  Spacer/Aero-Holding Josiah Lobo (E-Z SPACER) inhaler Reported on 01/15/2016 10/02/15   [provider]  SYNTHROID 25 MCG tablet  03/08/18   [provider]  Vilazodone HCl (VIIBRYD) 40 MG TABS Take 1 tablet (40 mg total) by mouth daily. 04/23/18   Ursula Alert, MD  zolpidem (AMBIEN) 10 MG tablet Take 0.5-1 tablets (5-10 mg total) by mouth at bedtime as needed for sleep. Patient taking differently: Take 10 mg by mouth at bedtime.  06/17/18   Ursula Alert, MD    Family History  Problem Relation Age of Onset  . Aneurysm Father        abdominal  . Diabetes Father   . Alcohol abuse Father   . Hypertension Father   . Aneurysm Brother        abdominal  . Alcohol abuse Brother   . Hypertension Brother   . Diabetes Mother   . Alcohol abuse Mother   . Hypertension Other        family hx  . Hyperlipidemia Other        family hx  . Diabetes Other        family hx     Social History   Tobacco Use  . Smoking status: Former Smoker    Years: 16.00    Types: Cigarettes    Last attempt to quit: 06/20/2010    Years since quitting: 8.1  . Smokeless tobacco: Never  Used  Substance Use Topics  . Alcohol use: No    Alcohol/week: 0.0 standard drinks  . Drug use: No    Allergies as of 08/03/2018 - Review Complete 07/04/2018  Allergen Reaction Noted  . No known allergies  02/08/2015    Review of Systems:    All systems reviewed and negative except where noted in HPI.   Physical Exam:  There were no vitals taken for this visit. No LMP recorded. Patient has had a hysterectomy. Psych:  Alert and cooperative. Normal mood and affect. General:   Alert,  Well-developed, well-nourished, pleasant and cooperative in NAD Head:  Normocephalic and atraumatic. Eyes:  Sclera clear, no icterus.   Conjunctiva pink. Ears:  Normal auditory acuity. Nose:  No deformity, discharge, or lesions. Mouth:  No deformity or lesions,oropharynx pink & moist. Neck:  Supple; no masses or thyromegaly. Lungs:  Respirations even and unlabored.  Clear throughout to auscultation.   No wheezes, crackles, or rhonchi. No acute distress. Heart:  Regular rate and rhythm; no murmurs, clicks, rubs, or gallops. Abdomen:  Normal bowel sounds.  No bruits.  Soft, non-tender and non-distended without masses, hepatosplenomegaly or hernias noted.  No guarding or rebound tenderness.    Neurologic:  Alert and oriented x3;  grossly normal neurologically. Skin:  Intact without significant lesions or rashes. No jaundice. Lymph Nodes:  No significant cervical adenopathy. Psych:  Alert and cooperative. Normal mood and affect.  Imaging Studies: Dg Chest 2 View  Result Date: 07/04/2018 CLINICAL DATA:  Left chest, shoulder and arm pain for several days. EXAM: CHEST - 2 VIEW COMPARISON:  Chest radiographs dated 05/31/2013. FINDINGS: Normal sized heart. Clear lungs. Post CABG changes. Coronary artery  stent. Mild thoracic spine degenerative changes. IMPRESSION: No acute abnormality. Electronically Signed   By: Claudie Revering M.D.   On: 07/04/2018 20:07    Assessment and Plan:   CHANTAVIA BAZZLE is a 62 y.o.  y/o female has been referred for diarrhea and abdominal pain which she states has been ongoing for 1 year. Wide differentials ranging from infecitous vs microscopic colitis vs osmotic.   Plan  1. Check CMP ,stool studies, calpropectin, CRP,CBC 2. Stop all artificial sugars as she could have an osmotic diarrhea 3. EGD+colonoscopy to evaluate abdominal pain and diarrhea and rule out microscopic colitis 4. Breath test for H pylori  5. If pain persists at next visit consider CT abdomen.  6. Bentyl PRN.  7. No NSAID's   I have discussed alternative options, risks & benefits,  which include, but are not limited to, bleeding, infection, perforation,respiratory complication & drug reaction.  The patient agrees with this plan & written consent will be obtained.     Follow up in 4 weeks  Dr [name] MD,MRCP(U.K)

## 2018-08-04 LAB — CBC WITH DIFFERENTIAL/PLATELET
Basophils Absolute: 0 10*3/uL (ref 0.0–0.2)
Basos: 0 %
EOS (ABSOLUTE): 0.1 10*3/uL (ref 0.0–0.4)
EOS: 1 %
HEMATOCRIT: 38 % (ref 34.0–46.6)
Hemoglobin: 12.4 g/dL (ref 11.1–15.9)
IMMATURE GRANULOCYTES: 0 %
Immature Grans (Abs): 0 10*3/uL (ref 0.0–0.1)
Lymphocytes Absolute: 2.3 10*3/uL (ref 0.7–3.1)
Lymphs: 31 %
MCH: 28.1 pg (ref 26.6–33.0)
MCHC: 32.6 g/dL (ref 31.5–35.7)
MCV: 86 fL (ref 79–97)
MONOS ABS: 0.4 10*3/uL (ref 0.1–0.9)
Monocytes: 6 %
NEUTROS PCT: 62 %
Neutrophils Absolute: 4.5 10*3/uL (ref 1.4–7.0)
Platelets: 423 10*3/uL (ref 150–450)
RBC: 4.41 x10E6/uL (ref 3.77–5.28)
RDW: 14.2 % (ref 12.3–15.4)
WBC: 7.4 10*3/uL (ref 3.4–10.8)

## 2018-08-04 LAB — COMPREHENSIVE METABOLIC PANEL
ALBUMIN: 4.2 g/dL (ref 3.6–4.8)
ALT: 15 IU/L (ref 0–32)
AST: 21 IU/L (ref 0–40)
Albumin/Globulin Ratio: 1.8 (ref 1.2–2.2)
Alkaline Phosphatase: 117 IU/L (ref 39–117)
BUN / CREAT RATIO: 15 (ref 12–28)
BUN: 10 mg/dL (ref 8–27)
Bilirubin Total: <0.2 mg/dL (ref 0.0–1.2)
CO2: 21 mmol/L (ref 20–29)
CREATININE: 0.67 mg/dL (ref 0.57–1.00)
Calcium: 9.6 mg/dL (ref 8.7–10.3)
Chloride: 91 mmol/L — ABNORMAL LOW (ref 96–106)
GFR, EST AFRICAN AMERICAN: 108 mL/min/{1.73_m2} (ref >59)
GFR, EST NON AFRICAN AMERICAN: 94 mL/min/{1.73_m2} (ref >59)
GLUCOSE: 173 mg/dL — AB (ref 65–99)
Globulin, Total: 2.4 g/dL (ref 1.5–4.5)
Potassium: 3.8 mmol/L (ref 3.5–5.2)
Sodium: 127 mmol/L — ABNORMAL LOW (ref 134–144)
TOTAL PROTEIN: 6.6 g/dL (ref 6.0–8.5)

## 2018-08-04 LAB — TSH: TSH: 2.6 u[IU]/mL (ref 0.450–4.500)

## 2018-08-04 LAB — CELIAC DISEASE AB SCREEN W/RFX
Antigliadin Abs, IgA: 2 units (ref 0–19)
IgA/Immunoglobulin A, Serum: 70 mg/dL — ABNORMAL LOW (ref 87–352)
Transglutaminase IgA: <2 U/mL (ref 0–3)

## 2018-08-05 LAB — H. PYLORI BREATH TEST: H pylori Breath Test: NEGATIVE

## 2018-08-06 LAB — GI PROFILE, STOOL, PCR
ADENOVIRUS F 40/41: NOT DETECTED
ASTROVIRUS: NOT DETECTED
C DIFFICILE TOXIN A/B: NOT DETECTED
CAMPYLOBACTER: NOT DETECTED
Cryptosporidium: NOT DETECTED
Cyclospora cayetanensis: NOT DETECTED
ENTEROAGGREGATIVE E COLI: NOT DETECTED
ENTEROPATHOGENIC E COLI: NOT DETECTED
ENTEROTOXIGENIC E COLI: NOT DETECTED
Entamoeba histolytica: NOT DETECTED
Giardia lamblia: NOT DETECTED
NOROVIRUS GI/GII: NOT DETECTED
Plesiomonas shigelloides: NOT DETECTED
ROTAVIRUS A: NOT DETECTED
SAPOVIRUS: NOT DETECTED
SHIGA-TOXIN-PRODUCING E COLI: NOT DETECTED
Salmonella: NOT DETECTED
Shigella/Enteroinvasive E coli: NOT DETECTED
Vibrio cholerae: NOT DETECTED
Vibrio: NOT DETECTED
Yersinia enterocolitica: NOT DETECTED

## 2018-08-07 LAB — CLOSTRIDIUM DIFFICILE BY PCR: Toxigenic C. Difficile by PCR: NEGATIVE

## 2018-08-13 ENCOUNTER — Ambulatory Visit: Admitting: Anesthesiology

## 2018-08-13 ENCOUNTER — Encounter
Admission: RE | Disposition: A | Discharge status: Home / Self Care | Payer: Self-pay | Source: Ambulatory Visit | Attending: Gastroenterology

## 2018-08-13 ENCOUNTER — Other Ambulatory Visit: Payer: Self-pay

## 2018-08-13 ENCOUNTER — Encounter: Payer: Self-pay | Admitting: Anesthesiology

## 2018-08-13 ENCOUNTER — Ambulatory Visit
Admission: RE | Admit: 2018-08-13 | Discharge: 2018-08-13 | Disposition: A | Discharge status: Home / Self Care | Source: Ambulatory Visit | Attending: Gastroenterology | Admitting: Gastroenterology

## 2018-08-13 DIAGNOSIS — K219 Gastro-esophageal reflux disease without esophagitis: Secondary | ICD-10-CM | POA: Insufficient documentation

## 2018-08-13 DIAGNOSIS — E785 Hyperlipidemia, unspecified: Secondary | ICD-10-CM | POA: Insufficient documentation

## 2018-08-13 DIAGNOSIS — Z87891 Personal history of nicotine dependence: Secondary | ICD-10-CM | POA: Insufficient documentation

## 2018-08-13 DIAGNOSIS — F329 Major depressive disorder, single episode, unspecified: Secondary | ICD-10-CM | POA: Diagnosis not present

## 2018-08-13 DIAGNOSIS — Z79890 Hormone replacement therapy (postmenopausal): Secondary | ICD-10-CM | POA: Diagnosis not present

## 2018-08-13 DIAGNOSIS — R197 Diarrhea, unspecified: Secondary | ICD-10-CM

## 2018-08-13 DIAGNOSIS — R109 Unspecified abdominal pain: Secondary | ICD-10-CM | POA: Diagnosis not present

## 2018-08-13 DIAGNOSIS — K529 Noninfective gastroenteritis and colitis, unspecified: Secondary | ICD-10-CM | POA: Insufficient documentation

## 2018-08-13 DIAGNOSIS — Z85828 Personal history of other malignant neoplasm of skin: Secondary | ICD-10-CM | POA: Insufficient documentation

## 2018-08-13 DIAGNOSIS — Z79899 Other long term (current) drug therapy: Secondary | ICD-10-CM | POA: Insufficient documentation

## 2018-08-13 DIAGNOSIS — Z955 Presence of coronary angioplasty implant and graft: Secondary | ICD-10-CM | POA: Diagnosis not present

## 2018-08-13 DIAGNOSIS — Z951 Presence of aortocoronary bypass graft: Secondary | ICD-10-CM | POA: Insufficient documentation

## 2018-08-13 DIAGNOSIS — E1140 Type 2 diabetes mellitus with diabetic neuropathy, unspecified: Secondary | ICD-10-CM | POA: Diagnosis not present

## 2018-08-13 DIAGNOSIS — I2510 Atherosclerotic heart disease of native coronary artery without angina pectoris: Secondary | ICD-10-CM | POA: Insufficient documentation

## 2018-08-13 DIAGNOSIS — Z794 Long term (current) use of insulin: Secondary | ICD-10-CM | POA: Insufficient documentation

## 2018-08-13 DIAGNOSIS — I10 Essential (primary) hypertension: Secondary | ICD-10-CM | POA: Insufficient documentation

## 2018-08-13 DIAGNOSIS — M797 Fibromyalgia: Secondary | ICD-10-CM | POA: Diagnosis not present

## 2018-08-13 DIAGNOSIS — J449 Chronic obstructive pulmonary disease, unspecified: Secondary | ICD-10-CM | POA: Diagnosis not present

## 2018-08-13 HISTORY — PX: COLONOSCOPY WITH PROPOFOL: SHX5780

## 2018-08-13 HISTORY — PX: ESOPHAGOGASTRODUODENOSCOPY (EGD) WITH PROPOFOL: SHX5813

## 2018-08-13 LAB — GLUCOSE, CAPILLARY: GLUCOSE-CAPILLARY: 174 mg/dL — AB (ref 70–99)

## 2018-08-13 SURGERY — COLONOSCOPY WITH PROPOFOL
Anesthesia: General

## 2018-08-13 MED ORDER — FENTANYL CITRATE (PF) 100 MCG/2ML IJ SOLN
INTRAMUSCULAR | Status: AC
  Filled 2018-08-13: qty 2

## 2018-08-13 MED ORDER — LIDOCAINE HCL (CARDIAC) PF 100 MG/5ML IV SOSY
PREFILLED_SYRINGE | INTRAVENOUS | Status: DC | PRN
  Administered 2018-08-13: 30 mg via INTRAVENOUS

## 2018-08-13 MED ORDER — PROPOFOL 500 MG/50ML IV EMUL
INTRAVENOUS | Status: AC
  Filled 2018-08-13: qty 50

## 2018-08-13 MED ORDER — FENTANYL CITRATE (PF) 100 MCG/2ML IJ SOLN
INTRAMUSCULAR | Status: DC | PRN
  Administered 2018-08-13: 50 ug via INTRAVENOUS

## 2018-08-13 MED ORDER — MIDAZOLAM HCL 2 MG/2ML IJ SOLN
INTRAMUSCULAR | Status: AC
  Filled 2018-08-13: qty 2

## 2018-08-13 MED ORDER — EPHEDRINE SULFATE 50 MG/ML IJ SOLN
INTRAMUSCULAR | Status: AC
  Filled 2018-08-13: qty 1

## 2018-08-13 MED ORDER — EPHEDRINE SULFATE 50 MG/ML IJ SOLN
INTRAMUSCULAR | Status: DC | PRN
  Administered 2018-08-13 (×2): 5 mg via INTRAVENOUS

## 2018-08-13 MED ORDER — LIDOCAINE HCL (PF) 2 % IJ SOLN
INTRAMUSCULAR | Status: AC
  Filled 2018-08-13: qty 10

## 2018-08-13 MED ORDER — SODIUM CHLORIDE 0.9 % IV SOLN
INTRAVENOUS | Status: DC
  Administered 2018-08-13: 10:00:00 via INTRAVENOUS

## 2018-08-13 MED ORDER — MIDAZOLAM HCL 2 MG/2ML IJ SOLN
INTRAMUSCULAR | Status: DC | PRN
  Administered 2018-08-13: 2 mg via INTRAVENOUS

## 2018-08-13 MED ORDER — PROPOFOL 500 MG/50ML IV EMUL
INTRAVENOUS | Status: DC | PRN
  Administered 2018-08-13: 120 ug/kg/min via INTRAVENOUS

## 2018-08-13 NOTE — H&P (Signed)
Jennifer Bellows, MD [address] [place], [place], [place], [address] [address], [place], [place], [place], [address] Phone: [phone]  Fax: [phone]  Primary Care Physician:  Valera Castle, MD   Pre-Procedure History & Physical: HPI:  Jennifer [name] is a 62 y.o. female is here for an endoscopy and colonoscopy    Past Medical History:  Diagnosis Date  . Anemia 1985   bled during a cervical biopsy, led to bleed transfusion   . Anxiety   . Arthritis    pt. reports that her neck is stiff   . Bipolar depression ([place])   . Blood dyscrasia    reported hx [name] '85; saw hematologist Dr. Janese Banks 10/2016, VWD work-up WNL, not felt to have a primary bleeding disorder   . CAD (coronary artery disease)   . 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 COPD (chronic obstructive pulmonary disease) ([place])    dr. Gar Ponto PRIMARY IN [facility]    PCP  . Depression   . Depression with anxiety   . Diabetes mellitus   . Dyspnea    W/ EXERTION   . Enlarged liver   . Family history of adverse reaction to anesthesia    father woke up slowly  . Fibromyalgia   . Generalized anxiety disorder   . GERD (gastroesophageal reflux disease)   . Hyperlipidemia   . Hypertension   . Low back pain   . Migraines   . Neuropathy   . Persistent disorder of initiating or maintaining sleep   . Von Willebrand disease ([place])     Past Surgical History:  Procedure Laterality Date  . ABDOMINAL HYSTERECTOMY    . 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 APPLICATION  OF ROBOTIC ASSISTANCE FOR SPINAL PROCEDURE N/A 12/10/2016   Procedure: APPLICATION OF ROBOTIC ASSISTANCE FOR SPINAL PROCEDURE;  Surgeon: Kevan Ny Ditty, MD;  Location: [facility];  Service: Neurosurgery;  Laterality: N/A;  . BACK SURGERY  2007   lumbar  . BREAST IMPLANT REMOVAL    . CARDIAC CATHETERIZATION     CAD,PCI of LAD, Cypher stent 2.5-28mm  . CARDIAC CATHETERIZATION     PCI of mid-LAD in stent restenosis with a drug-eluting stent  . CARDIAC CATHETERIZATION  06/03/13   [facility];MID LAD 100 % STENOSIS; PRIOR STENT; MID RCA 40 % STENOSIS  . CORONARY ARTERY BYPASS GRAFT   08-12-2011   CABG x 3  . CORONARY STENT PLACEMENT    . LUMBAR FUSION     L1-S1  . LUMBAR PERCUTANEOUS PEDICLE SCREW 1 LEVEL N/A 12/10/2016   Procedure: Extension of pedicle screw fixation to LUMBAR FOUR;  Surgeon: Kevan Ny Ditty, MD;  Location: [facility];  Service: Neurosurgery;  Laterality: N/A;  . NASAL SINUS SURGERY  1997   tuirbinate reduction     Prior to Admission medications   Medication Sig Start Date End Date Taking? Authorizing Provider  albuterol-ipratropium (COMBIVENT) 18-103 MCG/ACT inhaler Inhale 2 puffs into the lungs daily as needed for wheezing or shortness of breath.     [provider]  amLODipine (NORVASC) 5 MG tablet Take 1 tablet (5 mg total) by mouth 2 (two) times daily. 08/18/17   Minna Merritts, MD  atorvastatin (LIPITOR) 80 MG tablet Take 1 tablet (80 mg total) by mouth daily. 08/18/17   Minna Merritts, MD  Blood Glucose Monitoring Suppl (FREESTYLE FREEDOM LITE) W/DEVICE KIT Use 3 (three) times daily. 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 Dx Code: 250.00 07/19/15   [provider]  cariprazine (VRAYLAR) capsule Take 1 capsule (1.5 mg total) by mouth daily. 04/23/18   Ursula Alert, MD  carisoprodol (SOMA) 350 MG tablet Take 1 tablet (350 mg total) by mouth every 6 (six) hours as needed for muscle spasms. 12/12/16   Ditty, Kevan Ny, MD  carvedilol (COREG) 12.5  MG tablet Take 1 tablet (12.5 mg total) by mouth 2 (two) times daily with a meal. 08/18/17   Gollan, Kathlene November, MD  dicyclomine (BENTYL) 10 MG capsule Take 1 capsule (10 mg total) by mouth 4 (four) times daily -  before meals and at bedtime. 08/03/18   Jennifer Bellows, MD  fluticasone [facility]) 50 MCG/ACT nasal spray Place 1 spray into both nostrils 2 (two) times daily as needed for allergies. 07/21/15   [provider]  glucose blood (FREESTYLE LITE) test strip Use 3 (three) times daily 02/05/18 02/06/19  [provider]  HUMULIN 70/30 KWIKPEN (70-30) 100 UNIT/ML PEN Inject 10-15 Units into the skin 2 (two) times daily. 15 units in the morning and 10-12 units at bedtime 05/07/16   [provider]  lisinopril (PRINIVIL,ZESTRIL) 20 MG tablet Take 1 tablet (20 mg total) by mouth daily. 08/18/17   Minna Merritts, MD  LYRICA 50 MG capsule Take 50 mg by mouth daily.  03/30/18   [provider]  mirtazapine (REMERON) 15 MG tablet Take 0.5 tablets (7.5 mg total) by mouth at bedtime. 06/02/18   Ursula Alert, MD  nitroGLYCERIN (NITROSTAT) 0.4 MG SL tablet Place 1 tablet (0.4 mg total) under the tongue every 5 (five) minutes as needed for chest pain. 08/18/17   Minna Merritts, MD  omeprazole (PRILOSEC) 20 MG capsule Take 20 mg by mouth daily.     [provider]  Oxcarbazepine (TRILEPTAL) 300 MG tablet Take 1 tablet (300 mg total) by mouth 2 (two) times daily. 04/23/18   Ursula Alert, MD  oxyCODONE (OXY IR/ROXICODONE) 5 MG immediate release tablet Take 5 mg by mouth every 6 (six) hours as needed for pain. 06/22/18   [provider]  RESTASIS 0.05 % ophthalmic emulsion Place 1 drop into both eyes daily as needed (for dryness).  09/16/15   [provider]  rOPINIRole (REQUIP) 0.5 MG tablet Take 1 mg by mouth at bedtime.  03/06/18   [provider]  sitaGLIPtin (JANUVIA) 100 MG tablet Take 100 mg by mouth daily. 09/24/16 09/24/17  [provider]  Spacer/Aero-Holding Josiah Lobo (E-Z SPACER) inhaler Reported on 01/15/2016 10/02/15   [provider]  SYNTHROID 25 MCG tablet  03/08/18   [provider]  Vilazodone HCl (VIIBRYD) 40 MG TABS Take 1 tablet (40 mg total) by mouth daily. 04/23/18   Ursula Alert, MD  zolpidem (AMBIEN) 10 MG tablet Take 0.5-1 tablets (5-10 mg total) by mouth at bedtime as needed for sleep. Patient taking differently: Take 10 mg by mouth at bedtime.  06/17/18   Ursula Alert, MD    Allergies as of 08/03/2018 - Review Complete 08/03/2018  Allergen Reaction Noted  .  No known allergies  02/08/2015    Family History  Problem Relation Age of Onset  . Aneurysm Father        abdominal  . Diabetes Father   . Alcohol abuse Father   . Hypertension Father   . Aneurysm Brother        abdominal  . Alcohol abuse Brother   . Hypertension Brother   . Diabetes Mother   . Alcohol abuse Mother   . Hypertension Other        family hx  . Hyperlipidemia Other        family hx  . Diabetes Other        family hx    Social History   Socioeconomic History  . Marital status: Married    Spouse name: Photographer  . Number of children: 3  . Years of education: Not on file  . Highest education level: Associate degree: occupational, Hotel manager, or vocational program  Occupational History  . Not on file  Social Needs  . Financial resource strain: Not on file  . Food insecurity:    Worry: Not on file    Inability: Not on file  . Transportation needs:    Medical: No    Non-medical: No  Tobacco Use  . Smoking status: Former Smoker    Years: 16.00    Types: Cigarettes    Last attempt to quit: 06/20/2010    Years since quitting: 8.1  . Smokeless tobacco: Never Used  Substance and Sexual Activity  . Alcohol use: No    Alcohol/week: 0.0 standard drinks  . Drug use: No  . Sexual activity: Yes  Lifestyle  . Physical activity:    Days per week: 0 days    Minutes per session: 0 min  .  Stress: Rather much  Relationships  . Social connections:    Talks on phone: Not on file    Gets together: Not on file    Attends religious service: More than 4 times per year    Active member of club or organization: No    Attends meetings of clubs or organizations: Never    Relationship status: Married  . Intimate partner violence:    Fear of current or ex partner: No    Emotionally abused: No    Physically abused: No    Forced sexual activity: No  Other Topics Concern  . Not on file  Social History Narrative  . Not on file    Review of Systems: See HPI, otherwise negative ROS  Physical Exam: There were no vitals taken for this visit. General:   Alert,  pleasant and cooperative in NAD Head:  Normocephalic and atraumatic. Neck:  Supple; no masses or thyromegaly. Lungs:  Clear throughout to auscultation, normal respiratory effort.    Heart:  +S1, +S2, Regular rate and rhythm, No edema. Abdomen:  Soft, nontender and nondistended. Normal bowel sounds, without guarding, and without rebound.   Neurologic:  Alert and  oriented x4;  grossly normal neurologically.  Impression/Plan: Jennifer [name] is here for an endoscopy and colonoscopy  to be performed for  evaluation of diarrhea    Risks, benefits, limitations, and alternatives regarding endoscopy have been reviewed with the patient.  Questions have been answered.  All parties agreeable.   Jennifer Bellows, MD  08/13/2018, 10:01 AM

## 2018-08-13 NOTE — Anesthesia Preprocedure Evaluation (Signed)
Anesthesia Evaluation  Patient identified by MRN, date of birth, ID band Patient awake    Reviewed: Allergy & Precautions, NPO status , Patient's Chart, lab work & pertinent test results  History of Anesthesia Complications Negative for: history of anesthetic complications  Airway Mallampati: II  TM Distance: >3 FB Neck ROM: Full    Dental no notable dental hx.    Pulmonary neg sleep apnea, COPD,  COPD inhaler, former smoker,    breath sounds clear to auscultation- rhonchi (-) wheezing      Cardiovascular hypertension, + CAD, + Cardiac Stents (all prior to CABG) and + CABG (2012)   Rhythm:Regular Rate:Normal - Systolic murmurs and - Diastolic murmurs    Neuro/Psych  Headaches, PSYCHIATRIC DISORDERS Anxiety Depression Bipolar Disorder    GI/Hepatic Neg liver ROS, GERD  ,  Endo/Other  diabetes, Insulin Dependent  Renal/GU negative Renal ROS     Musculoskeletal  (+) Arthritis , Fibromyalgia -  Abdominal (+) - obese,   Peds  Hematology  (+) anemia ,   Anesthesia Other Findings Past Medical History: 1985: Anemia     Comment:  bled during a cervical biopsy, led to bleed transfusion  No date: Anxiety No date: Arthritis     Comment:  pt. reports that her neck is stiff  No date: Bipolar depression ([place]) No date: Blood dyscrasia     Comment:  reported hx [name] '85; saw hematologist Dr. 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 Gar Ponto PRIMARY IN [facility]    PCP No date: Depression No date: Depression with anxiety No date: Diabetes mellitus No date: Dyspnea     Comment:  W/ EXERTION  No date: Enlarged liver No date: Family history of adverse  reaction to anesthesia     Comment:  father woke up slowly No date: Fibromyalgia No date: Generalized anxiety disorder No date: GERD (gastroesophageal reflux disease) No date: Hyperlipidemia No date: Hypertension No date: Low back pain No date: Migraines No date: Neuropathy No date: Persistent disorder of initiating or maintaining sleep No date: Von Willebrand disease ([place])   Reproductive/Obstetrics                             Anesthesia Physical Anesthesia Plan  ASA: III  Anesthesia Plan: General   Post-op Pain Management:    Induction: Intravenous  PONV Risk Score and Plan: 2 and Propofol infusion  Airway Management Planned: Natural Airway  Additional Equipment:   Intra-op Plan:   Post-operative Plan:   Informed Consent: I have reviewed the patients History and Physical, chart, labs and discussed the procedure including the risks, benefits and alternatives for the proposed anesthesia with the patient or authorized representative who has indicated his/her understanding and acceptance.   Dental advisory given  Plan Discussed with: CRNA and Anesthesiologist  Anesthesia Plan Comments:         Anesthesia Quick Evaluation

## 2018-08-13 NOTE — Anesthesia Procedure Notes (Signed)
Performed by: Cook-Martin, Alvis Edgell Pre-anesthesia Checklist: Patient identified, Emergency Drugs available, Suction available, Patient being monitored and Timeout performed Patient Re-evaluated:Patient Re-evaluated prior to induction Oxygen Delivery Method: Nasal cannula Preoxygenation: Pre-oxygenation with 100% oxygen Induction Type: IV induction Airway Equipment and Method: Bite block Placement Confirmation: CO2 detector and positive ETCO2       

## 2018-08-13 NOTE — Anesthesia Post-op Follow-up Note (Signed)
Anesthesia QCDR form completed.        

## 2018-08-13 NOTE — Op Note (Signed)
[facility] Gastroenterology Patient Name: Jennifer [name] Procedure Date: 08/13/2018 10:31 AM MRN: [id number] Account #: [id number] Date of Birth: February 12, 1956 Admit Type: Outpatient Age: 62 Room: [facility] ENDO ROOM 3 Gender: Female Note Status: Finalized Procedure:            Upper GI endoscopy Indications:          Diarrhea Providers:            [name] MD, MD Referring MD:         Valera Castle (Referring MD) Medicines:            Monitored Anesthesia Care Complications:        No immediate complications. Procedure:            Pre-Anesthesia Assessment:                       - Prior to the procedure, a History and Physical was                        performed, and patient medications, allergies and                        sensitivities were reviewed. The patient's tolerance of                        previous anesthesia was reviewed.                       - The risks and benefits of the procedure and the                        sedation options and risks were discussed with the                        patient. All questions were answered and informed                        consent was obtained.                       - ASA Grade Assessment: II - A patient with mild                        systemic disease.                       After obtaining informed consent, the endoscope was                        passed under direct vision. Throughout the procedure,                        the patient's blood pressure, pulse, and oxygen                        saturations were monitored continuously. The Endoscope                        was introduced through the mouth, and advanced to the  third part of duodenum. The upper GI endoscopy was                        accomplished with ease. The patient tolerated the                        procedure well. Findings:      The esophagus was normal.      The stomach was normal.      The examined duodenum was  normal. Biopsies were taken with a cold       forceps for histology. Impression:           - Normal esophagus.                       - Normal stomach.                       - Normal examined duodenum. Biopsied. Recommendation:       - Await pathology results.                       - Perform a colonoscopy today. Procedure Code(s):    --- Professional ---                       [id number], Esophagogastroduodenoscopy, flexible, transoral;                        with biopsy, single or multiple Diagnosis Code(s):    --- Professional ---                       R19.7, Diarrhea, unspecified CPT copyright 2018 American Medical Association. All rights reserved. The codes documented in this report are preliminary and upon coder review may  be revised to meet current compliance requirements. [name], MD [name] MD, MD 08/13/2018 10:40:08 AM This report has been signed electronically. Number of Addenda: 0 Note Initiated On: 08/13/2018 10:31 AM      [facility]

## 2018-08-13 NOTE — Anesthesia Postprocedure Evaluation (Signed)
Anesthesia Post Note  Patient: Ruhi Kopke Gelinas  Procedure(s) Performed: COLONOSCOPY WITH PROPOFOL (N/A ) ESOPHAGOGASTRODUODENOSCOPY (EGD) WITH PROPOFOL (N/A )  Patient location during evaluation: Endoscopy Anesthesia Type: General Level of consciousness: awake and alert and oriented Pain management: pain level controlled Vital Signs Assessment: post-procedure vital signs reviewed and stable Respiratory status: spontaneous breathing, nonlabored ventilation and respiratory function stable Cardiovascular status: blood pressure returned to baseline and stable Postop Assessment: no signs of nausea or vomiting Anesthetic complications: no     Last Vitals:  Vitals:   08/13/18 1120 08/13/18 1130  BP: 99/65 92/65  Pulse: 69 74  Resp: 15 15  Temp:    SpO2: 100% 96%    Last Pain:  Vitals:   08/13/18 1130  TempSrc:   PainSc: 0-No pain                 Kile Kabler

## 2018-08-13 NOTE — Op Note (Signed)
[facility] Gastroenterology Patient Name: Jennifer [name] Procedure Date: 08/13/2018 10:30 AM MRN: [id number] Account #: [id number] Date of Birth: 11-16-1955 Admit Type: Outpatient Age: 62 Room: [facility] ENDO ROOM 3 Gender: Female Note Status: Finalized Procedure:            Colonoscopy Indications:          Chronic diarrhea Providers:            [name] MD, MD Referring MD:         Valera Castle (Referring MD) Medicines:            Monitored Anesthesia Care Complications:        No immediate complications. Procedure:            Pre-Anesthesia Assessment:                       - Prior to the procedure, a History and Physical was                        performed, and patient medications, allergies and                        sensitivities were reviewed. The patient's tolerance of                        previous anesthesia was reviewed.                       - The risks and benefits of the procedure and the                        sedation options and risks were discussed with the                        patient. All questions were answered and informed                        consent was obtained.                       - ASA Grade Assessment: II - A patient with mild                        systemic disease.                       After obtaining informed consent, the colonoscope was                        passed under direct vision. Throughout the procedure,                        the patient's blood pressure, pulse, and oxygen                        saturations were monitored continuously. The                        Colonoscope was introduced through the anus and                        advanced to  the the terminal ileum. The colonoscopy was                        performed with ease. The patient tolerated the                        procedure well. The quality of the bowel preparation                        was good. Findings:      The perianal and digital  rectal examinations were normal.      Normal mucosa was found in the entire colon. Biopsies for histology were       taken with a cold forceps from the entire colon for evaluation of       microscopic colitis.      The terminal ileum appeared normal. Biopsies were taken with a cold       forceps for histology.      A 10 mm polypoid lesion was found in the distal rectum. The lesion was       semi-sessile. No bleeding was present. Not resected as it extended to       the anus Impression:           - Normal mucosa in the entire examined colon. Biopsied.                       - The examined portion of the ileum was normal.                        Biopsied.                       - Polypoid lesion in the distal rectum. Recommendation:       - Discharge patient to home (with escort).                       - Resume previous diet.                       - Continue present medications.                       - Repeat colonoscopy in 5 years for surveillance.                       - Refer to Dr Dahlia Byes to excise polyp at anorectal                        junction                       - Return to my office in 1 week. Procedure Code(s):    --- Professional ---                       [phone], Colonoscopy, flexible; with biopsy, single or                        multiple CPT copyright 2018 American Medical Association. All rights reserved. The codes documented in this report are preliminary and upon coder review may  be revised to meet current compliance requirements. [name], MD  [name] MD, MD 08/13/2018 10:59:40 AM This report has been signed electronically. Number of Addenda: 0 Note Initiated On: 08/13/2018 10:30 AM Scope Withdrawal Time: 0 hours 10 minutes 22 seconds  Total Procedure Duration: 0 hours 15 minutes 4 seconds       [facility]

## 2018-08-13 NOTE — Transfer of Care (Signed)
Immediate Anesthesia Transfer of Care Note  Patient: Jennifer [name]  Procedure(s) Performed: COLONOSCOPY WITH PROPOFOL (N/A ) ESOPHAGOGASTRODUODENOSCOPY (EGD) WITH PROPOFOL (N/A )  Patient Location: PACU  Anesthesia Type:General  Level of Consciousness: awake and sedated  Airway & Oxygen Therapy: Patient Spontanous Breathing and Patient connected to nasal cannula oxygen  Post-op Assessment: Report given to RN and Post -op Vital signs reviewed and stable  Post vital signs: Reviewed and stable  Last Vitals:  Vitals Value Taken Time  BP    Temp    Pulse    Resp    SpO2      Last Pain:  Vitals:   08/13/18 1019  TempSrc: Tympanic  PainSc: 0-No pain         Complications: No apparent anesthesia complications

## 2018-08-14 ENCOUNTER — Telehealth: Payer: Self-pay

## 2018-08-14 ENCOUNTER — Other Ambulatory Visit: Payer: Self-pay

## 2018-08-14 DIAGNOSIS — K620 Anal polyp: Secondary | ICD-10-CM

## 2018-08-14 DIAGNOSIS — K621 Rectal polyp: Principal | ICD-10-CM

## 2018-08-14 NOTE — Telephone Encounter (Signed)
-----   Message from [name], MD sent at 08/13/2018 11:01 AM EDT ----- Regarding: please arrange appointment  Jennifer [name]  Please arrange referal to Dr Dahlia Byes to excise polyp at ano rectal junction seen on colonoscopy    Regards    Dr [name]  Gastroenterology/Hepatology Pager: [phone]

## 2018-08-14 NOTE — Telephone Encounter (Signed)
Spoke with pt and informed her of Dr. Georgeann Oppenheim instructions to refer pt to general surgery to have anorectal polyp excised. Pt is aware that she will be contacted by Dr. Corlis Leak office to schedule her appointment.

## 2018-08-15 LAB — SURGICAL PATHOLOGY

## 2018-08-18 ENCOUNTER — Ambulatory Visit (INDEPENDENT_AMBULATORY_CARE_PROVIDER_SITE_OTHER): Admitting: Psychiatry

## 2018-08-18 ENCOUNTER — Encounter: Payer: Self-pay | Admitting: Psychiatry

## 2018-08-18 VITALS — BP 148/78 | HR 88 | Ht 63.0 in | Wt 163.2 lb

## 2018-08-18 DIAGNOSIS — G2402 Drug induced acute dystonia: Secondary | ICD-10-CM

## 2018-08-18 DIAGNOSIS — F5105 Insomnia due to other mental disorder: Secondary | ICD-10-CM

## 2018-08-18 DIAGNOSIS — T43505A Adverse effect of unspecified antipsychotics and neuroleptics, initial encounter: Secondary | ICD-10-CM

## 2018-08-18 DIAGNOSIS — F411 Generalized anxiety disorder: Secondary | ICD-10-CM

## 2018-08-18 DIAGNOSIS — F3130 Bipolar disorder, current episode depressed, mild or moderate severity, unspecified: Secondary | ICD-10-CM | POA: Diagnosis not present

## 2018-08-18 MED ORDER — BENZTROPINE MESYLATE 0.5 MG PO TABS: 0.5000 mg | ORAL_TABLET | Freq: Every day | ORAL | 1 refills | Status: DC | PRN

## 2018-08-18 MED ORDER — CARIPRAZINE HCL 1.5 MG PO CAPS: 1.5000 mg | ORAL_CAPSULE | Freq: Every day | ORAL | 0 refills | Status: DC

## 2018-08-18 NOTE — Progress Notes (Signed)
[facility] MD OP Progress Note  08/18/2018 5:12 PM Jennifer [name]  MRN:  [id number]  Chief Complaint: ' I am here for follow up." Chief Complaint    Follow-up     [id number] is a 62 year old Caucasian female, lives in [place], married, has a history of bipolar disorder, anxiety disorder, presented to the clinic today for a follow-up visit.  Patient today reports she is currently improving on the current medication regimen.  She reports she likes the effect of Vryalar.  She reports her mood lability as improved.  She feels less depressed now.  She also reports her anxiety symptoms is more under control.  She however reports some side effects.  She reports some movement of her jaw on and off.  Discussed adding Cogentin.  She agrees with plan.  She reports sleep is improved on the Ambien.  She does not know if the Remeron is helpful or not.  Discussed with patient that she does not have to be on the Remeron as well as the Viibryd together.  She will stop the Remeron for a few days and see if her sleep continues to be okay just on the Ambien.  If she does not do well she can restart Remeron and when she returns her Remeron dosage can be adjusted and Viibryd can be tapered off.  Discussed this with patient.  Patient denies any suicidality.  Patient denies any perceptual disturbances.  Patient continues to have relationship struggles however reports she copes by staying away from those individuals with whom she has problems.  She continues to take care of her grandchildren.    She currently struggles with carpal tunnel syndrome-left-sided.  She is wearing a splint which helps to some extent.   Visit Diagnosis:    ICD-10-CM   1. Bipolar I disorder, most recent episode depressed ([place]) F31.30    improving  2. Generalized anxiety disorder F41.1   3. 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 Insomnia due to mental condition F51.05     Past Psychiatric History: Reviewed past  psychiatric history from my progress note on 02/05/2018  Past Medical History:  Past Medical History:  Diagnosis Date  . Anemia 1985   bled during a cervical biopsy, led to bleed transfusion   . Anxiety   . Arthritis    pt. reports that her neck is stiff   . Bipolar depression ([place])   . Blood dyscrasia    reported hx [name] '85; saw hematologist Dr. Janese Banks 10/2016, VWD work-up WNL, not felt to have a primary bleeding disorder   . CAD (coronary artery disease)   . 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 COPD (chronic obstructive pulmonary disease) ([place])    dr. Gar [facility]    PCP  . Depression   . Depression with anxiety   . Diabetes mellitus   . Dyspnea    W/ EXERTION   . Enlarged liver   . Family history of adverse reaction to anesthesia    father woke up slowly  . Fibromyalgia   . Generalized anxiety disorder   . GERD (gastroesophageal reflux disease)   . Hyperlipidemia   . Hypertension   . Low back pain   . Migraines   . Neuropathy   . Persistent disorder of initiating or maintaining sleep   . Von Willebrand disease [facility])     Past Surgical  History:  Procedure Laterality Date  . ABDOMINAL HYSTERECTOMY    . 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 APPLICATION OF ROBOTIC ASSISTANCE FOR SPINAL PROCEDURE N/A 12/10/2016   Procedure: APPLICATION OF ROBOTIC ASSISTANCE FOR SPINAL PROCEDURE;  Surgeon: Kevan Ny Ditty, MD;  Location: [place];  Service: Neurosurgery;  Laterality: N/A;  . BACK SURGERY  2007   lumbar  . BREAST IMPLANT  REMOVAL    . CARDIAC CATHETERIZATION     CAD,PCI of LAD, Cypher stent 2.5-28mm  . CARDIAC CATHETERIZATION     PCI of mid-LAD in stent restenosis with a drug-eluting stent  . CARDIAC CATHETERIZATION  06/03/13   [facility];MID LAD 100 % STENOSIS; PRIOR STENT; MID RCA 40 % STENOSIS  . COLONOSCOPY WITH PROPOFOL N/A 08/13/2018   Procedure: COLONOSCOPY WITH PROPOFOL;  Surgeon: [name], MD;  Location: [facility] ENDOSCOPY;  Service: Gastroenterology;  Laterality: N/A;  . CORONARY ARTERY BYPASS GRAFT   08-12-2011   CABG x 3  . CORONARY STENT PLACEMENT    . ESOPHAGOGASTRODUODENOSCOPY (EGD) WITH PROPOFOL N/A 08/13/2018   Procedure: ESOPHAGOGASTRODUODENOSCOPY (EGD) WITH PROPOFOL;  Surgeon: [name], MD;  Location: [facility] ENDOSCOPY;  Service: Gastroenterology;  Laterality: N/A;  . LUMBAR FUSION     L1-S1  . LUMBAR PERCUTANEOUS PEDICLE SCREW 1 LEVEL N/A 12/10/2016   Procedure: Extension of pedicle screw fixation to LUMBAR FOUR;  Surgeon: Kevan Ny Ditty, MD;  Location: [place];  Service: Neurosurgery;  Laterality: N/A;  . NASAL SINUS SURGERY  1997   tuirbinate reduction     Family Psychiatric History: Reviewed family psychiatric history from my progress note on 02/05/2018  Family History:  Family History  Problem Relation Age of Onset  . Aneurysm Father        abdominal  . Diabetes Father   . Alcohol abuse Father   . Hypertension Father   . Aneurysm Brother        abdominal  . Alcohol abuse Brother   . Hypertension Brother   . Diabetes Mother   . Alcohol abuse Mother   . Hypertension Other        family hx  . Hyperlipidemia Other        family hx  . Diabetes Other        family hx    Social History: Reviewed social history from my progress note on 02/05/2018. Social History   Socioeconomic History  . Marital status: Married    Spouse name: Photographer  . Number of children: 3  . Years of education: Not on file  . Highest education level: Associate degree: occupational, Hotel manager, or  vocational program  Occupational History  . Not on file  Social Needs  . Financial resource strain: Not on file  . Food insecurity:    Worry: Not on file    Inability: Not on file  . Transportation needs:    Medical: No    Non-medical: No  Tobacco Use  . Smoking status: Former Smoker    Years: 16.00    Types: Cigarettes    Last attempt to quit: 06/20/2010    Years since quitting: 8.1  . Smokeless tobacco: Never Used  Substance and Sexual Activity  . Alcohol use: No    Alcohol/week: 0.0 standard drinks  . Drug use: No  . Sexual activity: Yes  Lifestyle  . Physical activity:    Days per week: 0 days    Minutes per session: 0 min  . Stress: Rather much  Relationships  . Social connections:    Talks on phone: Not on file    Gets together: Not on file    Attends religious service: More than 4 times per year    Active member of club or organization: No    Attends meetings of clubs or organizations: Never    Relationship status: Married  Other Topics Concern  . Not on file  Social History Narrative  . Not on file    Allergies:  Allergies  Allergen Reactions  . No Known Allergies     Metabolic Disorder Labs: Lab Results  Component Value Date   HGBA1C 9.4 (H) 12/05/2017   MPG 223.08 12/05/2017   MPG 197 10/30/2016   Lab Results  Component Value Date   PROLACTIN 6.1 12/05/2017   Lab Results  Component Value Date   CHOL 238 (H) 12/05/2017   TRIG 819 (H) 12/05/2017   HDL 41 12/05/2017   CHOLHDL 5.8 12/05/2017   VLDL UNABLE TO CALCULATE IF TRIGLYCERIDE OVER 400 mg/dL 12/05/2017   LDLCALC UNABLE TO CALCULATE IF TRIGLYCERIDE OVER 400 mg/dL 12/05/2017   LDLCALC SEE COMMENT 07/09/2012   Lab Results  Component Value Date   TSH 2.600 08/03/2018   TSH 86.000 (H) 12/05/2017    Therapeutic Level Labs: No results found for: LITHIUM No results found for: VALPROATE No components found for:  CBMZ  Current Medications: Current Outpatient Medications  Medication  Sig Dispense Refill  . albuterol-ipratropium (COMBIVENT) 18-103 MCG/ACT inhaler Inhale 2 puffs into the lungs daily as needed for wheezing or shortness of breath.     Marland Kitchen amLODipine (NORVASC) 5 MG tablet Take 1 tablet (5 mg total) by mouth 2 (two) times daily. 180 tablet 4  . atorvastatin (LIPITOR) 80 MG tablet Take 1 tablet (80 mg total) by mouth daily. 90 tablet 3  . Blood Glucose Monitoring Suppl (FREESTYLE FREEDOM LITE) W/DEVICE KIT Use 3 (three) times daily. 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 Dx Code: 250.00    . cariprazine (VRAYLAR) capsule Take 1 capsule (1.5 mg total) by mouth daily. 90 capsule 0  . carisoprodol (SOMA) 350 MG tablet Take 1 tablet (350 mg total) by mouth every 6 (six) hours as needed for muscle spasms. 120 tablet 0  . carvedilol (COREG) 12.5 MG tablet Take 1 tablet (12.5 mg total) by mouth 2 (two) times daily with a meal. 180 tablet 3  . dicyclomine (BENTYL) 10 MG capsule Take 1 capsule (10 mg total) by mouth 4 (four) times daily -  before meals and at bedtime. 90 capsule 0  . fluticasone (FLONASE) 50 MCG/ACT nasal spray Place 1 spray into both nostrils 2 (two) times daily as needed for allergies.    Marland Kitchen glucose blood (FREESTYLE LITE) test strip Use 3 (three) times daily    . HUMULIN 70/30 KWIKPEN (70-30) 100 UNIT/ML PEN Inject 10-15 Units into the skin 2 (two) times daily. 15 units in the morning and 10-12 units at bedtime    . lisinopril (PRINIVIL,ZESTRIL) 20 MG tablet Take 1 tablet (20 mg total) by mouth daily. 90 tablet 3  . LYRICA 50 MG capsule Take 50 mg by mouth daily.     Marland Kitchen  mirtazapine (REMERON) 15 MG tablet Take 0.5 tablets (7.5 mg total) by mouth at bedtime. 45 tablet 1  . nitroGLYCERIN (NITROSTAT) 0.4 MG SL tablet Place 1 tablet (0.4 mg total) under the tongue every 5 (five) minutes as needed for chest pain. 25 tablet 6  . omeprazole (PRILOSEC) 20 MG capsule Take 20 mg by mouth daily.     . Oxcarbazepine (TRILEPTAL) 300 MG tablet Take 1 tablet (300 mg total)  by mouth 2 (two) times daily. 180 tablet 1  . oxyCODONE (OXY IR/ROXICODONE) 5 MG immediate release tablet Take 5 mg by mouth every 6 (six) hours as needed for pain.    Marland Kitchen RESTASIS 0.05 % ophthalmic emulsion Place 1 drop into both eyes daily as needed (for dryness).     Marland Kitchen rOPINIRole (REQUIP) 0.5 MG tablet Take 1 mg by mouth at bedtime.     Marland Kitchen Spacer/Aero-Holding Chambers (E-Z SPACER) inhaler Reported on 01/15/2016    . SYNTHROID 25 MCG tablet     . Vilazodone HCl (VIIBRYD) 40 MG TABS Take 1 tablet (40 mg total) by mouth daily. 90 tablet 2  . zolpidem (AMBIEN) 10 MG tablet Take 0.5-1 tablets (5-10 mg total) by mouth at bedtime as needed for sleep. (Patient taking differently: Take 10 mg by mouth at bedtime. ) 90 tablet 0  . benztropine (COGENTIN) 0.5 MG tablet Take 1 tablet (0.5 mg total) by mouth daily as needed. For side effects of vraylar 90 tablet 1  . sitaGLIPtin (JANUVIA) 100 MG tablet Take 100 mg by mouth daily.     No current facility-administered medications for this visit.      Musculoskeletal: Strength & Muscle Tone: within normal limits Gait & Station: normal Patient leans: N/A  Psychiatric Specialty Exam: Review of Systems  Musculoskeletal: Positive for joint pain.       Has carpal tunnel syndrome, wears a splint on left wrist  Psychiatric/Behavioral: The patient is nervous/anxious.   All other systems reviewed and are negative.   Blood pressure (!) 148/78, pulse 88, height _0  (1.6 m), weight 163 lb 3.2 oz (74 kg), SpO2 95 %.Body mass index is 28.91 kg/m.  General Appearance: Casual  Eye Contact:  Fair  Speech:  Normal Rate  Volume:  Normal  Mood:  Anxious  Affect:  Appropriate  Thought Process:  Goal Directed and Descriptions of Associations: Circumstantial  Orientation:  Full (Time, Place, and Person)  Thought Content: Logical   Suicidal Thoughts:  No  Homicidal Thoughts:  No  Memory:  Immediate;   Fair Recent;   Fair Remote;   Fair  Judgement:  Fair  Insight:   Fair  Psychomotor Activity:  Normal  Concentration:  Concentration: Fair and Attention Span: Fair  Recall:  [employer] of Knowledge: Fair  Language: Fair  Akathisia:  No  Handed:  Right  AIMS (if indicated): 6 - reports some jaw and tongue movement on and off- currently denies it.  Assets:  Communication Skills Desire for Improvement Housing Social Support  ADL's:  Intact  Cognition: WNL  Sleep:  improving   Screenings: AIMS     Office Visit from 08/18/2018 in [place] Total Score  6    PHQ2-9     Office Visit from 05/09/2016 in [place] Procedure visit from 03/20/2016 in [place] Office Visit from 03/12/2016 in [place] Clinical Support from 02/13/2016 in [place]  [place] Office Visit from 12/14/2015 in [facility]  PHQ-2 Total Score  0  0  0  0  0       Assessment and Plan: Janessa is a 62 year old Caucasian female who has a history of bipolar disorder, anxiety disorder, presented to the clinic today for a follow-up visit.  Patient is currently making progress on the current medication regimen.  She does struggle with some abnormal movements of her jaw from the antipsychotic.  Will continue to make medication changes.  Plan as noted below.  Plan Bipolar disorder Continue Trileptal 300 mg p.o. twice daily Continue Vraylar 1.5 mg po daily. Add Cogentin 0.5 mg po daily PRN for EPS. AIMS - 6   Neuroleptic induced acute dystonia Aims equals 6.  She reports she has some jaw stiffness on and off. Started Cogentin 0.5 mg p.o. daily as needed.   Anxiety symptoms Viibryd 40 mg p.o. daily  For insomnia Ambien and mirtazapine as prescribed. Patient will try to stop the mirtazapine since she feels it is not helpful.  Discussed  with her to go back on the mirtazapine if she notices sleep problems.  She will continue psychotherapy with Ms. Peacock.  Follow-up in clinic in 1 month or sooner if needed.  More than 50 % of the time was spent for psychoeducation and supportive psychotherapy and care coordination. This note was generated in part or whole with voice recognition software. Voice recognition is usually quite accurate but there are transcription errors that can and very often do occur. I apologize for any typographical errors that were not detected and corrected.       Ursula Alert, MD 08/18/2018, 5:12 PM

## 2018-08-18 NOTE — Patient Instructions (Signed)
Try stopping the Remeron since it is not helpful for sleep as you reported . If you have sleep problems once you stop it , please go back on it .

## 2018-08-25 ENCOUNTER — Telehealth: Payer: Self-pay

## 2018-08-25 NOTE — Telephone Encounter (Signed)
Spoke with pt and informed her of her endoscopy biopsy results and Dr. Georgeann Oppenheim instruction for pt to see Dr. Dahlia Byes. Pt states she has an appointment with Dr. Dahlia Byes on 08-31-18.

## 2018-08-25 NOTE — Telephone Encounter (Signed)
-----   Message from [name], MD sent at 08/23/2018 12:53 PM EDT ----- Sherald [name] inform biopsies of the duodenum and colon were normal. Still needs to see Dr Dahlia Byes for the anal polyp that needs to be excised

## 2018-08-31 ENCOUNTER — Ambulatory Visit: Payer: Self-pay | Admitting: Surgery

## 2018-09-08 ENCOUNTER — Other Ambulatory Visit: Payer: Self-pay | Admitting: Psychiatry

## 2018-09-14 ENCOUNTER — Encounter: Payer: Self-pay | Admitting: Psychiatry

## 2018-09-14 ENCOUNTER — Ambulatory Visit (INDEPENDENT_AMBULATORY_CARE_PROVIDER_SITE_OTHER): Admitting: Psychiatry

## 2018-09-14 VITALS — BP 150/86 | HR 82 | Ht 63.0 in | Wt 163.0 lb

## 2018-09-14 DIAGNOSIS — G2402 Drug induced acute dystonia: Secondary | ICD-10-CM | POA: Diagnosis not present

## 2018-09-14 DIAGNOSIS — F5105 Insomnia due to other mental disorder: Secondary | ICD-10-CM

## 2018-09-14 DIAGNOSIS — F3130 Bipolar disorder, current episode depressed, mild or moderate severity, unspecified: Secondary | ICD-10-CM

## 2018-09-14 DIAGNOSIS — T43505A Adverse effect of unspecified antipsychotics and neuroleptics, initial encounter: Secondary | ICD-10-CM

## 2018-09-14 DIAGNOSIS — F411 Generalized anxiety disorder: Secondary | ICD-10-CM

## 2018-09-14 MED ORDER — MIRTAZAPINE 15 MG PO TABS: 15.0000 mg | ORAL_TABLET | Freq: Every day | ORAL | 1 refills | Status: DC

## 2018-09-14 NOTE — Progress Notes (Signed)
[place] MD OP Progress Note  09/14/2018 5:29 PM Jennifer [name]  MRN:  [id number]  Chief Complaint: ' I am here for follow up." Chief Complaint    Follow-up     HPI: Jennifer [name] is a 62 year old Caucasian female, lives in [place], married, has a history of bipolar disorder, anxiety disorder, presented to the clinic today for a follow-up visit.  Patient today reports she is currently doing well with regards to her mood symptoms.  She is tolerating the medications well.  She reports she is compliant with them.  Patient reports she continues to struggle with sleep.  She reports she does better on mirtazapine 15 mg.  When she started taking 7.5 mg she noticed waking up earlier in the morning.  Discussed with patient that her mirtazapine can be increased to 15 mg.  She also has Ambien available which she needs at night for sleep.  Patient denies any suicidality.  Patient denies any perceptual disturbances.  Patient denies any other concerns today.  Discussed her elevated blood pressure with her today.  She will reach out to her primary medical doctor. Visit Diagnosis:    ICD-10-CM   1. Bipolar I disorder, most recent episode depressed ([place]) F31.30   2. Generalized anxiety disorder F41.1   3. 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 Insomnia due to mental condition F51.05     Past Psychiatric History: Have reviewed past psychiatric history from my progress note on 02/05/2018  Past Medical History:  Past Medical History:  Diagnosis Date  . Anemia 1985   bled during a cervical biopsy, led to bleed transfusion   . Anxiety   . Arthritis    pt. reports that her neck is stiff   . Bipolar depression ([place])   . Blood dyscrasia    reported hx [name] '85; saw hematologist Dr. Janese Banks 10/2016, VWD work-up WNL, not felt to have a primary bleeding disorder   . CAD (coronary artery disease)   . 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 COPD (chronic obstructive  pulmonary disease) ([place])    dr. Gar Ponto [facility]    PCP  . Depression   . Depression with anxiety   . Diabetes mellitus   . Dyspnea    W/ EXERTION   . Enlarged liver   . Family history of adverse reaction to anesthesia    father woke up slowly  . Fibromyalgia   . Generalized anxiety disorder   . GERD (gastroesophageal reflux disease)   . Hyperlipidemia   . Hypertension   . Low back pain   . Migraines   . Neuropathy   . Persistent disorder of initiating or maintaining sleep   . Von Willebrand disease ([place])     Past Surgical History:  Procedure Laterality Date  . ABDOMINAL HYSTERECTOMY    . ANTERIOR LAT LUMBAR FUSION N/A 12/10/2016   Procedure: LUMBAR FOUR-FIVE  Anterior lateral lumbar interbody fusion with LUMBAR FOUR-FIVE  Posterolateral fusion/Re-operative laminectomy LUMBAR FIVE-SACRUM ONE Bilateral, Laminectomy at LUMBAR FOUR-FIVE , 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  and Left LUMBAR FOUR-FIVE  with Mazor;  Surgeon: Kevan Ny Ditty, MD;  Location: [facility];  Service: Neurosurgery;  Laterality: N/A;  . APPLICATION OF ROBOTIC ASSISTANCE FOR SPINAL PROCEDURE N/A 12/10/2016   Procedure: APPLICATION OF ROBOTIC ASSISTANCE FOR SPINAL PROCEDURE;  Surgeon: Kevan Ny Ditty, MD;  Location: [facility];  Service: Neurosurgery;  Laterality: N/A;  . BACK SURGERY  2007   lumbar  . BREAST IMPLANT REMOVAL    . CARDIAC CATHETERIZATION     CAD,PCI of LAD, Cypher stent 2.5-28mm  . CARDIAC CATHETERIZATION     PCI of mid-LAD in stent restenosis with a drug-eluting stent  . CARDIAC CATHETERIZATION  06/03/13   [facility];MID LAD 100 % STENOSIS; PRIOR STENT; MID RCA 40 % STENOSIS  . COLONOSCOPY WITH PROPOFOL N/A 08/13/2018   Procedure: COLONOSCOPY WITH PROPOFOL;  Surgeon: [name], MD;  Location: [facility] ENDOSCOPY;  Service: Gastroenterology;  Laterality: N/A;  . CORONARY ARTERY BYPASS GRAFT   08-12-2011   CABG x 3  . CORONARY STENT PLACEMENT    .  ESOPHAGOGASTRODUODENOSCOPY (EGD) WITH PROPOFOL N/A 08/13/2018   Procedure: ESOPHAGOGASTRODUODENOSCOPY (EGD) WITH PROPOFOL;  Surgeon: [name], MD;  Location: [facility]COPY;  Service: Gastroenterology;  Laterality: N/A;  . LUMBAR FUSION     L1-S1  . LUMBAR PERCUTANEOUS PEDICLE SCREW 1 LEVEL N/A 12/10/2016   Procedure: Extension of pedicle screw fixation to LUMBAR FOUR;  Surgeon: Kevan Ny Ditty, MD;  Location: [place];  Service: Neurosurgery;  Laterality: N/A;  . NASAL SINUS SURGERY  1997   tuirbinate reduction     Family Psychiatric History: Have reviewed family psychiatric history from my progress note on 02/05/2018  Family History:  Family History  Problem Relation Age of Onset  . Aneurysm Father        abdominal  . Diabetes Father   . Alcohol abuse Father   . Hypertension Father   . Aneurysm Brother        abdominal  . Alcohol abuse Brother   . Hypertension Brother   . Diabetes Mother   . Alcohol abuse Mother   . Hypertension Other        family hx  . Hyperlipidemia Other        family hx  . Diabetes Other        family hx    Social History: Have reviewed social history from my progress note on 02/05/2018 Social History   Socioeconomic History  . Marital status: Married    Spouse name: Photographer  . Number of children: 3  . Years of education: Not on file  . Highest education level: Associate degree: occupational, Hotel manager, or vocational program  Occupational History  . Not on file  Social Needs  . Financial resource strain: Not on file  . Food insecurity:    Worry: Not on file    Inability: Not on file  . Transportation needs:    Medical: No    Non-medical: No  Tobacco Use  . Smoking status: Former Smoker    Years: 16.00    Types: Cigarettes    Last attempt to quit: 06/20/2010    Years since quitting: 8.2  . Smokeless tobacco: Never Used  Substance and Sexual Activity  . Alcohol use: No    Alcohol/week: 0.0 standard drinks  . Drug use: No  . Sexual  activity: Yes  Lifestyle  . Physical activity:    Days per week: 0 days    Minutes per session: 0 min  . Stress: Rather much  Relationships  . Social connections:    Talks on phone: Not on file    Gets together: Not on file    Attends religious service: More than 4 times per year    Active member of club or organization: No    Attends meetings of clubs or organizations: Never  Relationship status: Married  Other Topics Concern  . Not on file  Social History Narrative  . Not on file    Allergies:  Allergies  Allergen Reactions  . No Known Allergies     Metabolic Disorder Labs: Lab Results  Component Value Date   HGBA1C 9.4 (H) 12/05/2017   MPG 223.08 12/05/2017   MPG 197 10/30/2016   Lab Results  Component Value Date   PROLACTIN 6.1 12/05/2017   Lab Results  Component Value Date   CHOL 238 (H) 12/05/2017   TRIG 819 (H) 12/05/2017   HDL 41 12/05/2017   CHOLHDL 5.8 12/05/2017   VLDL UNABLE TO CALCULATE IF TRIGLYCERIDE OVER 400 mg/dL 12/05/2017   LDLCALC UNABLE TO CALCULATE IF TRIGLYCERIDE OVER 400 mg/dL 12/05/2017   LDLCALC SEE COMMENT 07/09/2012   Lab Results  Component Value Date   TSH 2.600 08/03/2018   TSH 86.000 (H) 12/05/2017    Therapeutic Level Labs: No results found for: LITHIUM No results found for: VALPROATE No components found for:  CBMZ  Current Medications: Current Outpatient Medications  Medication Sig Dispense Refill  . albuterol-ipratropium (COMBIVENT) 18-103 MCG/ACT inhaler Inhale 2 puffs into the lungs daily as needed for wheezing or shortness of breath.     Marland Kitchen amLODipine (NORVASC) 5 MG tablet Take 1 tablet (5 mg total) by mouth 2 (two) times daily. 180 tablet 4  . atorvastatin (LIPITOR) 80 MG tablet Take 1 tablet (80 mg total) by mouth daily. 90 tablet 3  . benztropine (COGENTIN) 0.5 MG tablet Take 1 tablet (0.5 mg total) by mouth daily as needed. For side effects of vraylar 90 tablet 1  . Blood Glucose Monitoring Suppl (FREESTYLE  FREEDOM LITE) W/DEVICE KIT Use 3 (three) times daily. 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 Dx Code: 250.00    . cariprazine (VRAYLAR) capsule Take 1 capsule (1.5 mg total) by mouth daily. 90 capsule 0  . carisoprodol (SOMA) 350 MG tablet Take 1 tablet (350 mg total) by mouth every 6 (six) hours as needed for muscle spasms. 120 tablet 0  . carvedilol (COREG) 12.5 MG tablet Take 1 tablet (12.5 mg total) by mouth 2 (two) times daily with a meal. 180 tablet 3  . dicyclomine (BENTYL) 10 MG capsule Take 1 capsule (10 mg total) by mouth 4 (four) times daily -  before meals and at bedtime. 90 capsule 0  . fluticasone (FLONASE) 50 MCG/ACT nasal spray Place 1 spray into both nostrils 2 (two) times daily as needed for allergies.    Marland Kitchen glucose blood (FREESTYLE LITE) test strip Use 3 (three) times daily    . HUMULIN 70/30 KWIKPEN (70-30) 100 UNIT/ML PEN Inject 10-15 Units into the skin 2 (two) times daily. 15 units in the morning and 10-12 units at bedtime    . lisinopril (PRINIVIL,ZESTRIL) 20 MG tablet Take 1 tablet (20 mg total) by mouth daily. 90 tablet 3  . LYRICA 50 MG capsule Take 50 mg by mouth daily.     . mirtazapine (REMERON) 15 MG tablet Take 1 tablet (15 mg total) by mouth at bedtime. 90 tablet 1  . nitroGLYCERIN (NITROSTAT) 0.4 MG SL tablet Place 1 tablet (0.4 mg total) under the tongue every 5 (five) minutes as needed for chest pain. 25 tablet 6  . omeprazole (PRILOSEC) 20 MG capsule Take 20 mg by mouth daily.     . Oxcarbazepine (TRILEPTAL) 300 MG tablet Take 1 tablet (300 mg total) by mouth 2 (two) times daily. 180 tablet 1  .  oxyCODONE (OXY IR/ROXICODONE) 5 MG immediate release tablet Take 5 mg by mouth every 6 (six) hours as needed for pain.    Marland Kitchen RESTASIS 0.05 % ophthalmic emulsion Place 1 drop into both eyes daily as needed (for dryness).     Marland Kitchen rOPINIRole (REQUIP) 0.5 MG tablet Take 1 mg by mouth at bedtime.     Marland Kitchen Spacer/Aero-Holding Chambers (E-Z SPACER) inhaler Reported on 01/15/2016     . SYNTHROID 25 MCG tablet     . Vilazodone HCl (VIIBRYD) 40 MG TABS Take 1 tablet (40 mg total) by mouth daily. 90 tablet 2  . zolpidem (AMBIEN) 10 MG tablet TAKE 1/2 TO 1 (ONE-HALF TO ONE) TABLET BY MOUTH AT BEDTIME AS NEEDED FOR SLEEP 90 tablet 0  . sitaGLIPtin (JANUVIA) 100 MG tablet Take 100 mg by mouth daily.     No current facility-administered medications for this visit.      Musculoskeletal: Strength & Muscle Tone: within normal limits Gait & Station: normal Patient leans: N/A  Psychiatric Specialty Exam: Review of Systems  Psychiatric/Behavioral: The patient has insomnia.   All other systems reviewed and are negative.   Blood pressure (!) 150/86, pulse 82, height '5\' 3"'  (1.6 m), weight 163 lb (73.9 kg), SpO2 97 %.Body mass index is 28.87 kg/m.  General Appearance: Casual  Eye Contact:  Fair  Speech:  Normal Rate  Volume:  Normal  Mood:  Euthymic  Affect:  Congruent  Thought Process:  Goal Directed and Descriptions of Associations: Intact  Orientation:  Full (Time, Place, and Person)  Thought Content: Logical   Suicidal Thoughts:  No  Homicidal Thoughts:  No  Memory:  Immediate;   Fair Recent;   Fair Remote;   Fair  Judgement:  Fair  Insight:  Fair  Psychomotor Activity:  Normal  Concentration:  Concentration: Fair and Attention Span: Fair  Recall:  [employer] of Knowledge: Fair  Language: Fair  Akathisia:  No  Handed:  Right  AIMS (if indicated): 0  Assets:  Communication Skills Desire for Improvement Social Support  ADL's:  Intact  Cognition: WNL  Sleep:  Poor   Screenings: AIMS     Office Visit from 08/18/2018 in [place] Total Score  6    PHQ2-9     Office Visit from 05/09/2016 in [place] Procedure visit from 03/20/2016 in [place] Office Visit from 03/12/2016 in [place]  Clinical Support from 02/13/2016 in [place] Office Visit from 12/14/2015 in [place]  PHQ-2 Total Score  0  0  0  0  0       Assessment and Plan: Jennifer [name] is a 62 year old Caucasian female who has a history of bipolar disorder, anxiety disorder, presented to the clinic today for a follow-up visit.  Patient continues to struggle with sleep problems.  Discussed medication changes as noted below.  Plan Bipolar disorder Continue Trileptal 300 mg p.o. twice daily Continue Vrayalr 1.5 mg p.o. Daily. Continue Cogentin 0.5 mg p.o. daily as needed for EPS  Neuroleptic induced acute dystonia-resolved [place] equals 0 Continue Cogentin 0.5 mg p.o. daily as needed  Anxiety symptoms Viibryd 40 mg p.o. daily  For insomnia Continue Ambien as prescribed Increase mirtazapine to 15 mg p.o. Nightly.  Follow-up in clinic in 2 month or sooner if needed.  Patient will continue to follow-up  with Ms. Peacock for psychotherapy sessions.  More than 50 % of the time was spent for psychoeducation and supportive psychotherapy and care coordination.  This note was generated in part or whole with voice recognition software. Voice recognition is usually quite accurate but there are transcription errors that can and very often do occur. I apologize for any typographical errors that were not detected and corrected.         Ursula Alert, MD 09/14/2018, 5:29 PM

## 2018-09-16 ENCOUNTER — Ambulatory Visit: Payer: Self-pay | Admitting: Surgery

## 2018-09-17 ENCOUNTER — Ambulatory Visit: Admitting: Gastroenterology

## 2018-09-23 ENCOUNTER — Encounter: Payer: Self-pay | Admitting: Surgery

## 2018-09-23 ENCOUNTER — Ambulatory Visit (INDEPENDENT_AMBULATORY_CARE_PROVIDER_SITE_OTHER): Payer: PRIVATE HEALTH INSURANCE | Admitting: Surgery

## 2018-09-23 ENCOUNTER — Telehealth: Payer: Self-pay | Admitting: *Deleted

## 2018-09-23 ENCOUNTER — Other Ambulatory Visit: Payer: Self-pay

## 2018-09-23 VITALS — BP 167/93 | HR 76 | Temp 96.8°F | Resp 20 | Ht 63.0 in | Wt 161.0 lb

## 2018-09-23 DIAGNOSIS — K6289 Other specified diseases of anus and rectum: Secondary | ICD-10-CM | POA: Diagnosis not present

## 2018-09-23 NOTE — Patient Instructions (Addendum)
Schedule excision rectal mass under general anesthesia  The patient is aware to call back for any questions or new concerns. One enema day before procedure and day of procedure

## 2018-09-23 NOTE — Telephone Encounter (Signed)
No answer on home number and not able to leave a message.   I did call cell phone and leave a message that patient's pre-admission appointment is scheduled for 10-01-18 at 12:45 pm. I have asked the patient to call back to confirm that she received the message.   Patient's surgery has been scheduled for 10-06-18 at [facility] with Dr. Dahlia Byes. The patient has been made aware to use one fleets enema the day before and the day of surgery.

## 2018-09-28 ENCOUNTER — Telehealth: Payer: Self-pay | Admitting: *Deleted

## 2018-09-28 ENCOUNTER — Encounter: Payer: Self-pay | Admitting: Surgery

## 2018-09-28 NOTE — Telephone Encounter (Signed)
I contacted patient this morning to confirm surgery has been scheduled for 10-06-18 at [facility] with Dr. Dahlia Byes.  The patient was scheduled to Pre-Admit on 10-01-18 at 12:45 pm. The patient states she has a conflict with the day and time and was given the number to call Pre-admission to reschedule. The patient is aware to check in at the [place], [address] (first floor).   The patient is aware to call the office should she have further questions.

## 2018-09-28 NOTE — Progress Notes (Signed)
Patient ID: Jennifer [name], female   DOB: 09-10-1956, 62 y.o.   MRN: [id number]  HPI Jennifer [name] is a 62 y.o. female in consultation at the request of Dr. Vicente Males for rectal lesion found on colonoscopy.  He was being worked up by Dr. Vicente Males secondary to diarrhea and chronic nausea and vomiting.  Cancer in the brother.  She has chronic abdominal pain. Reports that the pain is in the epigastric area, intermittent worsening after meals and dull in nature. Denies any hematochezia or melena Both EGD and colonoscopy were personally reviewed.  There is evidence for small rectal lesion found on scope.  No characteristics for malignancy. She does have a history of bipolar disorder and anxiety's chronic pain. Personally reviewed chest x-ray performed couple months ago showing no acute issues Path from both EGD and colonocopy were reviewed showing no major abnormalities. C. difficile is negative as well.  CMP is normal except the glucose that is 173. Does have a history of coronary artery disease status post stent placement. There is no chills or fevers, no weight loss HPI  Past Medical History:  Diagnosis Date  . Anemia 1985   bled during a cervical biopsy, led to bleed transfusion   . Anxiety   . Arthritis    pt. reports that her neck is stiff   . Bipolar depression ([place])   . Blood dyscrasia    reported hx [name] '85; saw hematologist Dr. Janese Banks 10/2016, VWD work-up WNL, not felt to have a primary bleeding disorder   . CAD (coronary artery disease)   . 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 COPD (chronic obstructive pulmonary disease) ([place])    dr. Gar Ponto PRIMARY IN [facility]    PCP  . Depression   . Depression with anxiety   . Diabetes mellitus   . Dyspnea    W/ EXERTION   . Enlarged liver   . Family history of adverse reaction to anesthesia    father woke up slowly  . Fibromyalgia   . Generalized anxiety disorder   . GERD (gastroesophageal reflux  disease)   . Hyperlipidemia   . Hypertension   . Low back pain   . Migraines   . Neuropathy   . Persistent disorder of initiating or maintaining sleep   . Von Willebrand disease ([place])     Past Surgical History:  Procedure Laterality Date  . ABDOMINAL HYSTERECTOMY    . 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 APPLICATION OF ROBOTIC ASSISTANCE FOR SPINAL PROCEDURE N/A 12/10/2016   Procedure: APPLICATION OF ROBOTIC ASSISTANCE FOR SPINAL PROCEDURE;  Surgeon: Kevan Ny Ditty, MD;  Location: [facility];  Service: Neurosurgery;  Laterality: N/A;  . BACK SURGERY  2007   lumbar  . BREAST IMPLANT REMOVAL    . CARDIAC CATHETERIZATION     CAD,PCI of LAD, Cypher stent 2.5-28mm  . CARDIAC CATHETERIZATION     PCI of mid-LAD in stent restenosis with a drug-eluting stent  . CARDIAC CATHETERIZATION  06/03/13   [facility];MID LAD 100 % STENOSIS; PRIOR STENT; MID RCA 40 %  STENOSIS  . COLONOSCOPY WITH PROPOFOL N/A 08/13/2018   Procedure: COLONOSCOPY WITH PROPOFOL;  Surgeon: [name], MD;  Location[facility] ENDOSCOPY;  Service: Gastroenterology;  Laterality: N/A;  . CORONARY ARTERY BYPASS GRAFT   08-12-2011   CABG x 3  . CORONARY STENT PLACEMENT    . ESOPHAGOGASTRODUODENOSCOPY (EGD) WITH PROPOFOL N/A 08/13/2018   Procedure: ESOPHAGOGASTRODUODENOSCOPY (EGD) WITH PROPOFOL;  Surgeon: [name], MD;  Location: [facility] ENDOSCOPY;  Service: Gastroenterology;  Laterality: N/A;  . LUMBAR FUSION     L1-S1  . LUMBAR PERCUTANEOUS PEDICLE SCREW 1 LEVEL N/A 12/10/2016   Procedure: Extension of pedicle screw fixation to LUMBAR  FOUR;  Surgeon: Kevan Ny Ditty, MD;  Location: [facility];  Service: Neurosurgery;  Laterality: N/A;  . NASAL SINUS SURGERY  1997   tuirbinate reduction     Family History  Problem Relation Age of Onset  . Aneurysm Father        abdominal  . Diabetes Father   . Alcohol abuse Father   . Hypertension Father   . Aneurysm Brother        abdominal  . Alcohol abuse Brother   . Hypertension Brother   . Colon cancer Brother   . Diabetes Mother   . Alcohol abuse Mother   . Hypertension Other        family hx  . Hyperlipidemia Other        family hx  . Diabetes Other        family hx    Social History Social History   Tobacco Use  . Smoking status: Former Smoker    Years: 16.00    Types: Cigarettes    Last attempt to quit: 06/20/2010    Years since quitting: 8.2  . Smokeless tobacco: Never Used  Substance Use Topics  . Alcohol use: No    Alcohol/week: 0.0 standard drinks  . Drug use: No    Allergies  Allergen Reactions  . No Known Allergies     Current Outpatient Medications  Medication Sig Dispense Refill  . albuterol-ipratropium (COMBIVENT) 18-103 MCG/ACT inhaler Inhale 2 puffs into the lungs daily as needed for wheezing or shortness of breath.     Marland Kitchen amLODipine (NORVASC) 5 MG tablet Take 1 tablet (5 mg total) by mouth 2 (two) times daily. 180 tablet 4  . atorvastatin (LIPITOR) 80 MG tablet Take 1 tablet (80 mg total) by mouth daily. 90 tablet 3  . Blood Glucose Monitoring Suppl (FREESTYLE FREEDOM LITE) W/DEVICE KIT Use 3 (three) times daily. 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 Dx Code: 250.00    . carisoprodol (SOMA) 350 MG tablet Take 1 tablet (350 mg total) by mouth every 6 (six) hours as needed for muscle spasms. 120 tablet 0  . carvedilol (COREG) 12.5 MG tablet Take 1 tablet (12.5 mg total) by mouth 2 (two) times daily with a meal. 180 tablet 3  . dicyclomine (BENTYL) 10 MG capsule Take 1 capsule (10 mg total) by mouth 4 (four) times daily -  before meals  and at bedtime. 90 capsule 0  . fluticasone (FLONASE) 50 MCG/ACT nasal spray Place 1 spray into both nostrils 2 (two) times daily as needed for allergies.    Marland Kitchen glucose blood (FREESTYLE LITE) test strip Use 3 (three) times daily    . HUMULIN 70/30 KWIKPEN (70-30) 100 UNIT/ML PEN Inject 10-15 Units into the skin 2 (two) times daily. 15 units in the morning and 10-12 units at bedtime    . lisinopril (  PRINIVIL,ZESTRIL) 20 MG tablet Take 1 tablet (20 mg total) by mouth daily. 90 tablet 3  . LYRICA 50 MG capsule Take 50 mg by mouth daily.     . mirtazapine (REMERON) 15 MG tablet Take 1 tablet (15 mg total) by mouth at bedtime. 90 tablet 1  . nitroGLYCERIN (NITROSTAT) 0.4 MG SL tablet Place 1 tablet (0.4 mg total) under the tongue every 5 (five) minutes as needed for chest pain. 25 tablet 6  . omeprazole (PRILOSEC) 20 MG capsule Take 20 mg by mouth daily.     . Oxcarbazepine (TRILEPTAL) 300 MG tablet Take 1 tablet (300 mg total) by mouth 2 (two) times daily. 180 tablet 1  . oxyCODONE (OXY IR/ROXICODONE) 5 MG immediate release tablet Take 5 mg by mouth every 6 (six) hours as needed for pain.    Marland Kitchen RESTASIS 0.05 % ophthalmic emulsion Place 1 drop into both eyes daily as needed (for dryness).     Marland Kitchen rOPINIRole (REQUIP) 0.5 MG tablet Take 1 mg by mouth at bedtime.     Marland Kitchen Spacer/Aero-Holding Chambers (E-Z SPACER) inhaler Reported on 01/15/2016    . SYNTHROID 25 MCG tablet     . Vilazodone HCl (VIIBRYD) 40 MG TABS Take 1 tablet (40 mg total) by mouth daily. 90 tablet 2  . zolpidem (AMBIEN) 10 MG tablet TAKE 1/2 TO 1 (ONE-HALF TO ONE) TABLET BY MOUTH AT BEDTIME AS NEEDED FOR SLEEP 90 tablet 0  . sitaGLIPtin (JANUVIA) 100 MG tablet Take 100 mg by mouth daily.     No current facility-administered medications for this visit.      Review of Systems Full ROS  was asked and was negative except for the information on the HPI  Physical Exam Blood pressure (!) 167/93, pulse 76, temperature (!) 96.8 F (36 C),  temperature source Skin, resp. rate 20, height _0  (1.6 m), weight 161 lb (73 kg), SpO2 96 %. CONSTITUTIONAL: NAD EYES: Pupils are equal, round, and reactive to light, Sclera are non-icteric. EARS, NOSE, MOUTH AND THROAT: The oropharynx is clear. The oral mucosa is pink and moist. Hearing is intact to voice. LYMPH NODES:  Lymph nodes in the neck are normal. RESPIRATORY:  Lungs are clear. There is normal respiratory effort, with equal breath sounds bilaterally, and without pathologic use of accessory muscles. CARDIOVASCULAR: Heart is regular without murmurs, gallops, or rubs. GI: The abdomen is soft, nontender, and nondistended. There are no palpable masses. There is no hepatosplenomegaly. There are normal bowel sounds in all quadrants. Rectal: Evidence of a thick area on anterior midline rectal vault. Some external and internal hemorrhoids. No evidence of large invading masses   MUSCULOSKELETAL: Normal muscle strength and tone. No cyanosis or edema.   SKIN: Turgor is good and there are no pathologic skin lesions or ulcers. NEUROLOGIC: Motor and sensation is grossly normal. Cranial nerves are grossly intact. PSYCH:  Oriented to person, place and time. Affect is normal.  Data Reviewed  I have personally reviewed the patient's imaging, laboratory findings and medical records.    Assessment/Plan Small rectal polyp.  Discussed with the patient in detail about options and I do recommend excision to exclude any potential malignancy.  Procedure discussed with the patient in detail.  Risk benefit and possible complications including but not limited to: Bleeding, infection, need for further intervention.  She understands and wishes to proceed.  We will plan for exam under anesthesia in the prone position and excision of rectal lesion transanally. Copy of this report was  sent to the referring provider     Caroleen Hamman, MD FACS General Surgeon 09/28/2018, 8:29 AM

## 2018-09-28 NOTE — H&P (View-Only) (Signed)
Patient ID: Jennifer [name], female   DOB: 09-10-1956, 62 y.o.   MRN: [id number]  HPI Jennifer [name] is a 62 y.o. female in consultation at the request of Dr. Vicente [name] for rectal lesion found on colonoscopy.  He was being worked up by Dr. Vicente [name] secondary to diarrhea and chronic nausea and vomiting.  Cancer in the brother.  She has chronic abdominal pain. Reports that the pain is in the epigastric area, intermittent worsening after meals and dull in nature. Denies any hematochezia or melena Both EGD and colonoscopy were personally reviewed.  There is evidence for small rectal lesion found on scope.  No characteristics for malignancy. She does have a history of bipolar disorder and anxiety's chronic pain. Personally reviewed chest x-ray performed couple months ago showing no acute issues Path from both EGD and colonocopy were reviewed showing no major abnormalities. C. difficile is negative as well.  CMP is normal except the glucose that is 173. Does have a history of coronary artery disease status post stent placement. There is no chills or fevers, no weight loss HPI  Past Medical History:  Diagnosis Date  . Anemia 1985   bled during a cervical biopsy, led to bleed transfusion   . Anxiety   . Arthritis    pt. reports that her neck is stiff   . Bipolar depression (Jennifer [name])   . Blood dyscrasia    reported hx Jennifer [name] '85; saw hematologist Dr. Janese [name] 10/2016, VWD work-up WNL, not felt to have a primary bleeding disorder   . CAD (coronary artery disease)   . 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 COPD (chronic obstructive pulmonary disease) ([place])    dr. Gar [name] PRIMARY IN [facility]    PCP  . Depression   . Depression with anxiety   . Diabetes mellitus   . Dyspnea    W/ EXERTION   . Enlarged liver   . Family history of adverse reaction to anesthesia    father woke up slowly  . Fibromyalgia   . Generalized anxiety disorder   . GERD (gastroesophageal reflux  disease)   . Hyperlipidemia   . Hypertension   . Low back pain   . Migraines   . Neuropathy   . Persistent disorder of initiating or maintaining sleep   . Von Willebrand disease ([place])     Past Surgical History:  Procedure Laterality Date  . ABDOMINAL HYSTERECTOMY    . 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 APPLICATION OF ROBOTIC ASSISTANCE FOR SPINAL PROCEDURE N/A 12/10/2016   Procedure: APPLICATION OF ROBOTIC ASSISTANCE FOR SPINAL PROCEDURE;  Surgeon: Kevan Ny Ditty, MD;  Location: [place];  Service: Neurosurgery;  Laterality: N/A;  . BACK SURGERY  2007   lumbar  . BREAST IMPLANT REMOVAL    . CARDIAC CATHETERIZATION     CAD,PCI of LAD, Cypher stent 2.5-28mm  . CARDIAC CATHETERIZATION     PCI of mid-LAD in stent restenosis with a drug-eluting stent  . CARDIAC CATHETERIZATION  06/03/13   [facility];MID LAD 100 % STENOSIS; PRIOR STENT; MID RCA 40 %  STENOSIS  . COLONOSCOPY WITH PROPOFOL N/A 08/13/2018   Procedure: COLONOSCOPY WITH PROPOFOL;  Surgeon: [name], MD;  Location[facility] ENDOSCOPY;  Service: Gastroenterology;  Laterality: N/A;  . CORONARY ARTERY BYPASS GRAFT   08-12-2011   CABG x 3  . CORONARY STENT PLACEMENT    . ESOPHAGOGASTRODUODENOSCOPY (EGD) WITH PROPOFOL N/A 08/13/2018   Procedure: ESOPHAGOGASTRODUODENOSCOPY (EGD) WITH PROPOFOL;  Surgeon: [name], MD;  Location: [facility] ENDOSCOPY;  Service: Gastroenterology;  Laterality: N/A;  . LUMBAR FUSION     L1-S1  . LUMBAR PERCUTANEOUS PEDICLE SCREW 1 LEVEL N/A 12/10/2016   Procedure: Extension of pedicle screw fixation to LUMBAR  FOUR;  Surgeon: Kevan Ny Ditty, MD;  Location: [place];  Service: Neurosurgery;  Laterality: N/A;  . NASAL SINUS SURGERY  1997   tuirbinate reduction     Family History  Problem Relation Age of Onset  . Aneurysm Father        abdominal  . Diabetes Father   . Alcohol abuse Father   . Hypertension Father   . Aneurysm Brother        abdominal  . Alcohol abuse Brother   . Hypertension Brother   . Colon cancer Brother   . Diabetes Mother   . Alcohol abuse Mother   . Hypertension Other        family hx  . Hyperlipidemia Other        family hx  . Diabetes Other        family hx    Social History Social History   Tobacco Use  . Smoking status: Former Smoker    Years: 16.00    Types: Cigarettes    Last attempt to quit: 06/20/2010    Years since quitting: 8.2  . Smokeless tobacco: Never Used  Substance Use Topics  . Alcohol use: No    Alcohol/week: 0.0 standard drinks  . Drug use: No    Allergies  Allergen Reactions  . No Known Allergies     Current Outpatient Medications  Medication Sig Dispense Refill  . albuterol-ipratropium (COMBIVENT) 18-103 MCG/ACT inhaler Inhale 2 puffs into the lungs daily as needed for wheezing or shortness of breath.     Marland Kitchen amLODipine (NORVASC) 5 MG tablet Take 1 tablet (5 mg total) by mouth 2 (two) times daily. 180 tablet 4  . atorvastatin (LIPITOR) 80 MG tablet Take 1 tablet (80 mg total) by mouth daily. 90 tablet 3  . Blood Glucose Monitoring Suppl (FREESTYLE FREEDOM LITE) W/DEVICE KIT Use 3 (three) times daily. 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 Dx Code: 250.00    . carisoprodol (SOMA) 350 MG tablet Take 1 tablet (350 mg total) by mouth every 6 (six) hours as needed for muscle spasms. 120 tablet 0  . carvedilol (COREG) 12.5 MG tablet Take 1 tablet (12.5 mg total) by mouth 2 (two) times daily with a meal. 180 tablet 3  . dicyclomine (BENTYL) 10 MG capsule Take 1 capsule (10 mg total) by mouth 4 (four) times daily -  before meals  and at bedtime. 90 capsule 0  . fluticasone (FLONASE) 50 MCG/ACT nasal spray Place 1 spray into both nostrils 2 (two) times daily as needed for allergies.    Marland Kitchen glucose blood (FREESTYLE LITE) test strip Use 3 (three) times daily    . HUMULIN 70/30 KWIKPEN (70-30) 100 UNIT/ML PEN Inject 10-15 Units into the skin 2 (two) times daily. 15 units in the morning and 10-12 units at bedtime    . lisinopril (  PRINIVIL,ZESTRIL) 20 MG tablet Take 1 tablet (20 mg total) by mouth daily. 90 tablet 3  . LYRICA 50 MG capsule Take 50 mg by mouth daily.     . mirtazapine (REMERON) 15 MG tablet Take 1 tablet (15 mg total) by mouth at bedtime. 90 tablet 1  . nitroGLYCERIN (NITROSTAT) 0.4 MG SL tablet Place 1 tablet (0.4 mg total) under the tongue every 5 (five) minutes as needed for chest pain. 25 tablet 6  . omeprazole (PRILOSEC) 20 MG capsule Take 20 mg by mouth daily.     . Oxcarbazepine (TRILEPTAL) 300 MG tablet Take 1 tablet (300 mg total) by mouth 2 (two) times daily. 180 tablet 1  . oxyCODONE (OXY IR/ROXICODONE) 5 MG immediate release tablet Take 5 mg by mouth every 6 (six) hours as needed for pain.    Marland Kitchen RESTASIS 0.05 % ophthalmic emulsion Place 1 drop into both eyes daily as needed (for dryness).     Marland Kitchen rOPINIRole (REQUIP) 0.5 MG tablet Take 1 mg by mouth at bedtime.     Marland Kitchen Spacer/Aero-Holding Chambers (E-Z SPACER) inhaler Reported on 01/15/2016    . SYNTHROID 25 MCG tablet     . Vilazodone HCl (VIIBRYD) 40 MG TABS Take 1 tablet (40 mg total) by mouth daily. 90 tablet 2  . zolpidem (AMBIEN) 10 MG tablet TAKE 1/2 TO 1 (ONE-HALF TO ONE) TABLET BY MOUTH AT BEDTIME AS NEEDED FOR SLEEP 90 tablet 0  . sitaGLIPtin (JANUVIA) 100 MG tablet Take 100 mg by mouth daily.     No current facility-administered medications for this visit.      Review of Systems Full ROS  was asked and was negative except for the information on the HPI  Physical Exam Blood pressure (!) 167/93, pulse 76, temperature (!) 96.8 F (36 C),  temperature source Skin, resp. rate 20, height _0  (1.6 m), weight 161 lb (73 kg), SpO2 96 %. CONSTITUTIONAL: NAD EYES: Pupils are equal, round, and reactive to light, Sclera are non-icteric. EARS, NOSE, MOUTH AND THROAT: The oropharynx is clear. The oral mucosa is pink and moist. Hearing is intact to voice. LYMPH NODES:  Lymph nodes in the neck are normal. RESPIRATORY:  Lungs are clear. There is normal respiratory effort, with equal breath sounds bilaterally, and without pathologic use of accessory muscles. CARDIOVASCULAR: Heart is regular without murmurs, gallops, or rubs. GI: The abdomen is soft, nontender, and nondistended. There are no palpable masses. There is no hepatosplenomegaly. There are normal bowel sounds in all quadrants. Rectal: Evidence of a thick area on anterior midline rectal vault. Some external and internal hemorrhoids. No evidence of large invading masses   MUSCULOSKELETAL: Normal muscle strength and tone. No cyanosis or edema.   SKIN: Turgor is good and there are no pathologic skin lesions or ulcers. NEUROLOGIC: Motor and sensation is grossly normal. Cranial nerves are grossly intact. PSYCH:  Oriented to person, place and time. Affect is normal.  Data Reviewed  I have personally reviewed the patient's imaging, laboratory findings and medical records.    Assessment/Plan Small rectal polyp.  Discussed with the patient in detail about options and I do recommend excision to exclude any potential malignancy.  Procedure discussed with the patient in detail.  Risk benefit and possible complications including but not limited to: Bleeding, infection, need for further intervention.  She understands and wishes to proceed.  We will plan for exam under anesthesia in the prone position and excision of rectal lesion transanally. Copy of this report was  sent to the referring provider     Caroleen Hamman, MD FACS General Surgeon 09/28/2018, 8:29 AM

## 2018-10-01 ENCOUNTER — Other Ambulatory Visit: Payer: Self-pay

## 2018-10-01 ENCOUNTER — Encounter
Admission: RE | Admit: 2018-10-01 | Discharge: 2018-10-01 | Disposition: A | Discharge status: Home / Self Care | Source: Ambulatory Visit | Attending: Surgery | Admitting: Surgery

## 2018-10-01 DIAGNOSIS — K6289 Other specified diseases of anus and rectum: Secondary | ICD-10-CM

## 2018-10-01 DIAGNOSIS — Z01812 Encounter for preprocedural laboratory examination: Secondary | ICD-10-CM | POA: Insufficient documentation

## 2018-10-01 HISTORY — DX: Angina pectoris, unspecified: I20.9

## 2018-10-01 HISTORY — DX: Restless legs syndrome: G25.81

## 2018-10-01 HISTORY — DX: Chronic kidney disease, unspecified: N18.9

## 2018-10-01 HISTORY — DX: Hypothyroidism, unspecified: E03.9

## 2018-10-01 HISTORY — DX: Hypo-osmolality and hyponatremia: E87.1

## 2018-10-01 MED ORDER — CHLORHEXIDINE GLUCONATE CLOTH 2 % EX PADS
6.0000 | MEDICATED_PAD | Freq: Once | CUTANEOUS | Status: DC
  Filled 2018-10-01: qty 6

## 2018-10-01 NOTE — Patient Instructions (Addendum)
Your procedure is scheduled on: 10/06/18 Tues Report to Same Day Surgery 2nd floor medical mall [facility] Entrance-take elevator on left to 2nd floor.  Check in with surgery information desk.) To find out your arrival time please call [phone] between 1PM - 3PM on 10/05/18 Mon Remember: Instructions that are not followed completely may result in serious medical risk, up to and including death, or upon the discretion of your surgeon and anesthesiologist your surgery may need to be rescheduled.    _x___ 1. Do not eat food after midnight the night before your procedure. You may drink clear liquids up to 2 hours before you are scheduled to arrive at the hospital for your procedure.  Do not drink clear liquids within 2 hours of your scheduled arrival to the hospital.  Clear liquids include  --Water or Apple juice without pulp  --Clear carbohydrate beverage such as ClearFast or Gatorade  --Black Coffee or Clear Tea (No milk, no creamers, do not add anything to                  the coffee or Tea Type 1 and type 2 diabetics should only drink water.   ____Ensure clear carbohydrate drink on the way to the hospital for bariatric patients  ____Ensure clear carbohydrate drink 3 hours before surgery for Dr Dwyane Luo patients if physician instructed.   No gum chewing or hard candies.     __x__ 2. No Alcohol for 24 hours before or after surgery.   __x__3. No Smoking or e-cigarettes for 24 prior to surgery.  Do not use any chewable tobacco products for at least 6 hour prior to surgery   ____  4. Bring all medications with you on the day of surgery if instructed.    __x__ 5. Notify your doctor if there is any change in your medical condition     (cold, fever, infections).    x___6. On the morning of surgery brush your teeth with toothpaste and water.  You may rinse your mouth with mouth wash if you wish.  Do not swallow any toothpaste or mouthwash.   Do not wear jewelry, make-up, hairpins,  clips or nail polish.  Do not wear lotions, powders, or perfumes. You may wear deodorant.  Do not shave 48 hours prior to surgery. Men may shave face and neck.  Do not bring valuables to the hospital.    [facility] is not responsible for any belongings or valuables.               Contacts, dentures or bridgework may not be worn into surgery.  Leave your suitcase in the car. After surgery it may be brought to your room.  For patients admitted to the hospital, discharge time is determined by your                       treatment team.  _  Patients discharged the day of surgery will not be allowed to drive home.  You will need someone to drive you home and stay with you the night of your procedure.    Please read over the following fact sheets that you were given:   [facility] Preparing for Surgery and or MRSA Information   _x___ Take anti-hypertensive listed below, cardiac, seizure, asthma,     anti-reflux and psychiatric medicines. These include:  1. albuterol-ipratropium (COMBIVENT) 18-103 MCG/ACT inhaler  2.amLODipine (NORVASC)   3.atorvastatin (LIPITOR) 80 MG tablet  4.carvedilol (COREG  5.LYRICA  50 MG capsule  6.omeprazole (PRILOSEC  7SYNTHROID 25 MCG tablet  8Vilazodone HCl (VIIBRYD) 40 MG TABS  _x___Fleets enema or Magnesium Citrate as directed.   _x___ Use CHG Soap or sage wipes as directed on instruction sheet   _x___ Use inhalers on the day of surgery and bring to hospital day of surgery  ____ Stop Metformin and Janumet 2 days prior to surgery.    __x__ Take 1/2 of usual insulin dose the night before surgery and none on the morning     surgery.   _x___ Follow recommendations from Cardiologist, Pulmonologist or PCP regarding          stopping Aspirin, Coumadin, Plavix ,Eliquis, Effient, or Pradaxa, and Pletal.  X____Stop Anti-inflammatories such as Advil, Aleve, Ibuprofen, Motrin, Naproxen, Naprosyn, Goodies powders or aspirin products. OK to take Tylenol and                           Celebrex.   _x___ Stop supplements until after surgery.  But may continue Vitamin D, Vitamin B,       and multivitamin.   ____ Bring C-Pap to the hospital.

## 2018-10-01 NOTE — Pre-Procedure Instructions (Signed)
Fleet enemas given for the night before and the morning of surgery.

## 2018-10-06 ENCOUNTER — Encounter: Payer: Self-pay | Admitting: *Deleted

## 2018-10-06 ENCOUNTER — Ambulatory Visit: Admitting: Anesthesiology

## 2018-10-06 ENCOUNTER — Ambulatory Visit
Admission: RE | Admit: 2018-10-06 | Discharge: 2018-10-06 | Disposition: A | Discharge status: Home / Self Care | Source: Ambulatory Visit | Attending: Surgery | Admitting: Surgery

## 2018-10-06 ENCOUNTER — Other Ambulatory Visit: Payer: Self-pay

## 2018-10-06 ENCOUNTER — Encounter
Admission: RE | Disposition: A | Discharge status: Home / Self Care | Payer: Self-pay | Source: Ambulatory Visit | Attending: Surgery

## 2018-10-06 DIAGNOSIS — M1990 Unspecified osteoarthritis, unspecified site: Secondary | ICD-10-CM | POA: Insufficient documentation

## 2018-10-06 DIAGNOSIS — E1140 Type 2 diabetes mellitus with diabetic neuropathy, unspecified: Secondary | ICD-10-CM | POA: Insufficient documentation

## 2018-10-06 DIAGNOSIS — Z955 Presence of coronary angioplasty implant and graft: Secondary | ICD-10-CM | POA: Diagnosis not present

## 2018-10-06 DIAGNOSIS — Z79899 Other long term (current) drug therapy: Secondary | ICD-10-CM | POA: Insufficient documentation

## 2018-10-06 DIAGNOSIS — E785 Hyperlipidemia, unspecified: Secondary | ICD-10-CM | POA: Diagnosis not present

## 2018-10-06 DIAGNOSIS — Z951 Presence of aortocoronary bypass graft: Secondary | ICD-10-CM | POA: Insufficient documentation

## 2018-10-06 DIAGNOSIS — I10 Essential (primary) hypertension: Secondary | ICD-10-CM | POA: Insufficient documentation

## 2018-10-06 DIAGNOSIS — K621 Rectal polyp: Secondary | ICD-10-CM | POA: Diagnosis present

## 2018-10-06 DIAGNOSIS — Z87891 Personal history of nicotine dependence: Secondary | ICD-10-CM | POA: Insufficient documentation

## 2018-10-06 DIAGNOSIS — K219 Gastro-esophageal reflux disease without esophagitis: Secondary | ICD-10-CM | POA: Diagnosis not present

## 2018-10-06 DIAGNOSIS — Z85828 Personal history of other malignant neoplasm of skin: Secondary | ICD-10-CM | POA: Insufficient documentation

## 2018-10-06 DIAGNOSIS — F418 Other specified anxiety disorders: Secondary | ICD-10-CM | POA: Insufficient documentation

## 2018-10-06 DIAGNOSIS — J449 Chronic obstructive pulmonary disease, unspecified: Secondary | ICD-10-CM | POA: Diagnosis not present

## 2018-10-06 DIAGNOSIS — G4700 Insomnia, unspecified: Secondary | ICD-10-CM | POA: Diagnosis not present

## 2018-10-06 DIAGNOSIS — M797 Fibromyalgia: Secondary | ICD-10-CM | POA: Diagnosis not present

## 2018-10-06 DIAGNOSIS — I2510 Atherosclerotic heart disease of native coronary artery without angina pectoris: Secondary | ICD-10-CM | POA: Insufficient documentation

## 2018-10-06 DIAGNOSIS — F319 Bipolar disorder, unspecified: Secondary | ICD-10-CM | POA: Insufficient documentation

## 2018-10-06 DIAGNOSIS — D680 Von Willebrand's disease: Secondary | ICD-10-CM | POA: Diagnosis not present

## 2018-10-06 DIAGNOSIS — Z794 Long term (current) use of insulin: Secondary | ICD-10-CM | POA: Diagnosis not present

## 2018-10-06 DIAGNOSIS — K644 Residual hemorrhoidal skin tags: Secondary | ICD-10-CM | POA: Diagnosis not present

## 2018-10-06 HISTORY — PX: RECTAL EXAM UNDER ANESTHESIA: SHX6399

## 2018-10-06 LAB — GLUCOSE, CAPILLARY
Glucose-Capillary: 158 mg/dL — ABNORMAL HIGH (ref 70–99)
Glucose-Capillary: 161 mg/dL — ABNORMAL HIGH (ref 70–99)

## 2018-10-06 SURGERY — EXAM UNDER ANESTHESIA, RECTUM
Anesthesia: General

## 2018-10-06 MED ORDER — LIDOCAINE HCL (CARDIAC) PF 100 MG/5ML IV SOSY
PREFILLED_SYRINGE | INTRAVENOUS | Status: DC | PRN
  Administered 2018-10-06: 100 mg via INTRAVENOUS

## 2018-10-06 MED ORDER — FAMOTIDINE 20 MG PO TABS
ORAL_TABLET | ORAL | Status: AC
  Filled 2018-10-06: qty 1

## 2018-10-06 MED ORDER — SUGAMMADEX SODIUM 200 MG/2ML IV SOLN
INTRAVENOUS | Status: DC | PRN
  Administered 2018-10-06: 150 mg via INTRAVENOUS

## 2018-10-06 MED ORDER — BUPIVACAINE-EPINEPHRINE (PF) 0.25% -1:200000 IJ SOLN
INTRAMUSCULAR | Status: DC | PRN
  Administered 2018-10-06: 30 mL via PERINEURAL

## 2018-10-06 MED ORDER — PROPOFOL 10 MG/ML IV BOLUS
INTRAVENOUS | Status: AC
  Filled 2018-10-06: qty 20

## 2018-10-06 MED ORDER — BUPIVACAINE LIPOSOME 1.3 % IJ SUSP
INTRAMUSCULAR | Status: DC | PRN
  Administered 2018-10-06: 20 mL

## 2018-10-06 MED ORDER — PROMETHAZINE HCL 25 MG/ML IJ SOLN: 6.2500 mg | INTRAMUSCULAR | Status: DC | PRN

## 2018-10-06 MED ORDER — SODIUM CHLORIDE 0.9 % IV SOLN
INTRAVENOUS | Status: DC
  Administered 2018-10-06 (×2): via INTRAVENOUS

## 2018-10-06 MED ORDER — BUPIVACAINE LIPOSOME 1.3 % IJ SUSP
INTRAMUSCULAR | Status: AC
  Filled 2018-10-06: qty 20

## 2018-10-06 MED ORDER — HYDROCODONE-ACETAMINOPHEN 5-325 MG PO TABS: 1.0000 | ORAL_TABLET | Freq: Four times a day (QID) | ORAL | 0 refills | Status: DC | PRN

## 2018-10-06 MED ORDER — ROCURONIUM BROMIDE 100 MG/10ML IV SOLN
INTRAVENOUS | Status: DC | PRN
  Administered 2018-10-06: 30 mg via INTRAVENOUS

## 2018-10-06 MED ORDER — HYDROMORPHONE HCL 1 MG/ML IJ SOLN: 0.2500 mg | INTRAMUSCULAR | Status: DC | PRN

## 2018-10-06 MED ORDER — FAMOTIDINE 20 MG PO TABS
20.0000 mg | ORAL_TABLET | Freq: Once | ORAL | Status: AC
  Administered 2018-10-06: 20 mg via ORAL

## 2018-10-06 MED ORDER — FENTANYL CITRATE (PF) 100 MCG/2ML IJ SOLN
INTRAMUSCULAR | Status: AC
  Filled 2018-10-06: qty 2

## 2018-10-06 MED ORDER — ALBUTEROL SULFATE HFA 108 (90 BASE) MCG/ACT IN AERS
INHALATION_SPRAY | RESPIRATORY_TRACT | Status: DC | PRN
  Administered 2018-10-06: 10 via RESPIRATORY_TRACT

## 2018-10-06 MED ORDER — BUPIVACAINE-EPINEPHRINE (PF) 0.25% -1:200000 IJ SOLN
INTRAMUSCULAR | Status: AC
  Filled 2018-10-06: qty 30

## 2018-10-06 MED ORDER — ONDANSETRON HCL 4 MG/2ML IJ SOLN
INTRAMUSCULAR | Status: DC | PRN
  Administered 2018-10-06: 4 mg via INTRAVENOUS

## 2018-10-06 MED ORDER — MIDAZOLAM HCL 2 MG/2ML IJ SOLN
INTRAMUSCULAR | Status: DC | PRN
  Administered 2018-10-06: 2 mg via INTRAVENOUS

## 2018-10-06 MED ORDER — PROPOFOL 10 MG/ML IV BOLUS
INTRAVENOUS | Status: DC | PRN
  Administered 2018-10-06: 50 mg via INTRAVENOUS
  Administered 2018-10-06: 150 mg via INTRAVENOUS

## 2018-10-06 MED ORDER — FENTANYL CITRATE (PF) 100 MCG/2ML IJ SOLN
INTRAMUSCULAR | Status: DC | PRN
  Administered 2018-10-06: 50 ug via INTRAVENOUS

## 2018-10-06 MED ORDER — MIDAZOLAM HCL 2 MG/2ML IJ SOLN
INTRAMUSCULAR | Status: AC
  Filled 2018-10-06: qty 2

## 2018-10-06 SURGICAL SUPPLY — 19 items
BENZOIN TINCTURE PRP APPL 2/3 (GAUZE/BANDAGES/DRESSINGS) ×2 IMPLANT
CANISTER SUCT 3000ML PPV (MISCELLANEOUS) ×3 IMPLANT
COVER WAND RF STERILE (DRAPES) ×3 IMPLANT
DRAPE LAPAROTOMY 77X122 PED (DRAPES) ×2 IMPLANT
DRAPE LEGGINS SURG 28X43 STRL (DRAPES) ×1 IMPLANT
DRAPE SHEET LG 3/4 BI-LAMINATE (DRAPES) ×1 IMPLANT
DRAPE UNDER BUTTOCK W/FLU (DRAPES) ×1 IMPLANT
ELECT REM PT RETURN 9FT ADLT (ELECTROSURGICAL) ×3
ELECTRODE REM PT RTRN 9FT ADLT (ELECTROSURGICAL) ×1 IMPLANT
GLOVE BIO SURGEON STRL SZ7 (GLOVE) ×3 IMPLANT
GOWN STRL REUS W/ TWL LRG LVL3 (GOWN DISPOSABLE) ×2 IMPLANT
GOWN STRL REUS W/TWL LRG LVL3 (GOWN DISPOSABLE) ×4
NS IRRIG 1000ML POUR BTL (IV SOLUTION) ×3 IMPLANT
PACK BASIN MINOR ARMC (MISCELLANEOUS) ×3 IMPLANT
SOL PREP PVP 2OZ (MISCELLANEOUS) ×3
SOLUTION PREP PVP 2OZ (MISCELLANEOUS) ×1 IMPLANT
SURGILUBE 2OZ TUBE FLIPTOP (MISCELLANEOUS) ×3 IMPLANT
SUT CHROMIC 3 0 PS 2 (SUTURE) ×2 IMPLANT
TAPE CLOTH 3X10 WHT NS LF (GAUZE/BANDAGES/DRESSINGS) ×2 IMPLANT

## 2018-10-06 NOTE — Discharge Instructions (Signed)

## 2018-10-06 NOTE — Interval H&P Note (Signed)
History and Physical Interval Note:  10/06/2018 4:50 PM  Jennifer [name]  has presented today for surgery, with the diagnosis of RECTAL MASS  The various methods of treatment have been discussed with the patient and family. After consideration of risks, benefits and other options for treatment, the patient has consented to  Procedure(s): RECTAL EXAM UNDER ANESTHESIA (N/A) as a surgical intervention .  The patient's history has been reviewed, patient examined, no change in status, stable for surgery.  I have reviewed the patient's chart and labs.  Questions were answered to the patient's satisfaction.     [facility]

## 2018-10-06 NOTE — Transfer of Care (Signed)
Immediate Anesthesia Transfer of Care Note  Patient: Jennifer [name]  Procedure(s) Performed: RECTAL EXAM UNDER ANESTHESIA (N/A )  Patient Location: PACU  Anesthesia Type:General  Level of Consciousness: awake  Airway & Oxygen Therapy: Patient connected to face mask oxygen  Post-op Assessment: Post -op Vital signs reviewed and stable  Post vital signs: stable  Last Vitals:  Vitals Value Taken Time  BP 210/91 10/06/2018  6:01 PM  Temp 36.6 C 10/06/2018  5:59 PM  Pulse 80 10/06/2018  6:01 PM  Resp 16 10/06/2018  6:01 PM  SpO2 100 % 10/06/2018  6:01 PM  Vitals shown include unvalidated device data.  Last Pain:  Vitals:   10/06/18 1313  TempSrc: Oral  PainSc: 0-No pain         Complications: No apparent anesthesia complications

## 2018-10-06 NOTE — Anesthesia Post-op Follow-up Note (Signed)
Anesthesia QCDR form completed.        

## 2018-10-06 NOTE — Op Note (Addendum)
  10/06/2018  5:45 PM  PATIENT:  Jennifer [name]  62 y.o. female  PRE-OPERATIVE DIAGNOSIS:  Rectal polyp  POST-OPERATIVE DIAGNOSIS:  Same  PROCEDURE:   Transanal excision of rectal polyp partial excision   SURGEON:  Surgeon(s) and Role:    * Kollin Udell F, MD - Primary  FINDINGS; 8 mm polyp anterior rectum  ANESTHESIA: GETA   DICTATION:  Patient was explained about the procedure in detail, risk benefits possible complications and a consent was obtained. The patient taken to the operating room and placed in the prone position.   Revealed an 8 mm polyp on the anterior wall of the rectum.  We elevated the polyp and using the harmonic scalpel we excised it.  This was a partial excision we reinforced our defect with a running 3-0 chromic suture.  Adequate hemostasis obtained.  There was no other intrarectal lesions.  There was some external hemorrhoids.   Liposomal Marcaine was injected around the wound site. Needle and laparotomy counts were correct and there were no immediate complications  Jules Husbands, MD

## 2018-10-06 NOTE — Anesthesia Postprocedure Evaluation (Signed)
Anesthesia Post Note  Patient: Jennifer [name]  Procedure(s) Performed: RECTAL EXAM UNDER ANESTHESIA (N/A )  Patient location during evaluation: PACU Anesthesia Type: General Level of consciousness: awake and alert Pain management: pain level controlled Vital Signs Assessment: post-procedure vital signs reviewed and stable Respiratory status: spontaneous breathing, nonlabored ventilation, respiratory function stable and patient connected to nasal cannula oxygen Cardiovascular status: blood pressure returned to baseline and stable Postop Assessment: no apparent nausea or vomiting Anesthetic complications: no     Last Vitals:  Vitals:   10/06/18 1815 10/06/18 1829  BP: (!) 182/84 (!) 158/105  Pulse: 77 80  Resp: 18 16  Temp:    SpO2: 98% 98%    Last Pain:  Vitals:   10/06/18 1829  TempSrc:   PainSc: 0-No pain                 Molli Barrows

## 2018-10-06 NOTE — Anesthesia Preprocedure Evaluation (Addendum)
Anesthesia Evaluation  Patient identified by MRN, date of birth, ID band Patient awake    Reviewed: Allergy & Precautions, H&P , NPO status , Patient's Chart, lab work & pertinent test results  History of Anesthesia Complications (+) Family history of anesthesia reaction  Airway Mallampati: III       Dental  (+) Poor Dentition   Pulmonary shortness of breath, COPD,  COPD inhaler, former smoker,           Cardiovascular Exercise Tolerance: Poor hypertension, (-) angina (denies angina since CABG)+ CAD, + Cardiac Stents (2013) and + CABG (2014)  (-) CHF (-) dysrhythmias   Echo 2015: normal METS unknown, limited by lower back and leg discomfort   Neuro/Psych  Headaches, PSYCHIATRIC DISORDERS Anxiety Depression Bipolar Disorder  Neuromuscular disease (CRPS, lumbar radiculopathy)    GI/Hepatic Neg liver ROS, GERD  ,Rectal lesion   Endo/Other  diabetesHypothyroidism   Renal/GU CRFRenal disease     Musculoskeletal  (+) Arthritis , Fibromyalgia -  Abdominal   Peds  Hematology negative hematology ROS (+) Blood dyscrasia, anemia ,   Anesthesia Other Findings Past Medical History: 1985: Anemia     Comment:  bled during a cervical biopsy, led to bleed transfusion  No date: Anginal pain ([place]) No date: Anxiety No date: Arthritis     Comment:  pt. reports that her neck is stiff  No date: Bipolar depression ([place]) No date: Blood dyscrasia     Comment:  reported hx [name] '85; saw hematologist Dr. 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 Gar Ponto PRIMARY IN [facility]    PCP No date: CRD (chronic renal disease) No date: Depression No date: Depression with  anxiety No date: Diabetes mellitus No date: Dyspnea     Comment:  W/ EXERTION  No date: Enlarged liver No date: Family history of adverse reaction to anesthesia     Comment:  father woke up slowly No date: Fibromyalgia No date: Generalized anxiety disorder No date: GERD (gastroesophageal reflux disease) No date: Hyperlipidemia No date: Hypertension No date: Hypothyroidism No date: Low back pain No date: Low sodium levels No date: Migraines No date: Neuropathy No date: Neuropathy No date: Persistent disorder of initiating or maintaining sleep No date: Restless leg No date: Von Willebrand disease ([place])  Past Surgical History: No date: ABDOMINAL HYSTERECTOMY 12/10/2016: ANTERIOR LAT LUMBAR FUSION; N/A     Comment:  Procedure: LUMBAR FOUR-FIVE  Anterior lateral lumbar               interbody fusion with LUMBAR FOUR-FIVE  Posterolateral               fusion/Re-operative laminectomy LUMBAR FIVE-SACRUM ONE               Bilateral, Laminectomy at LUMBAR FOUR-FIVE , excision of               extradural mass right LUMBAR FIVE-SACRUM ONE  and Left               LUMBAR FOUR-FIVE  with Mazor;  Surgeon: Kevan Ny               Ditty, MD;  Location: [place];  Service: Neurosurgery;                Laterality: N/A; 7/42/5956: APPLICATION OF ROBOTIC ASSISTANCE FOR SPINAL PROCEDURE; N/A     Comment:  Procedure: APPLICATION OF ROBOTIC ASSISTANCE FOR SPINAL               PROCEDURE;  Surgeon: Kevan Ny Ditty, MD;  Location:              [place];  Service: Neurosurgery;  Laterality: N/A; 2007: BACK SURGERY     Comment:  lumbar No date: BREAST IMPLANT REMOVAL No date: CARDIAC CATHETERIZATION     Comment:  CAD,PCI of LAD, Cypher stent 2.5-28mm No date: CARDIAC CATHETERIZATION     Comment:  PCI of mid-LAD in stent restenosis with a drug-eluting               stent 06/03/13: CARDIAC CATHETERIZATION     Comment:  [facility];MID LAD 100 % STENOSIS; PRIOR STENT; MID RCA 40 %                STENOSIS 08/13/2018: COLONOSCOPY WITH PROPOFOL; N/A     Comment:  Procedure: COLONOSCOPY WITH PROPOFOL;  Surgeon: [name], MD;  Location: [facility] ENDOSCOPY;  Service:               Gastroenterology;  Laterality: N/A;  08-12-2011: CORONARY ARTERY BYPASS GRAFT     Comment:  CABG x 3 No date: CORONARY STENT PLACEMENT 08/13/2018: ESOPHAGOGASTRODUODENOSCOPY (EGD) WITH PROPOFOL; N/A     Comment:  Procedure: ESOPHAGOGASTRODUODENOSCOPY (EGD) WITH               PROPOFOL;  Surgeon: [name], MD;  Location: [facility]               ENDOSCOPY;  Service: Gastroenterology;  Laterality: N/A; No date: LUMBAR FUSION     Comment:  L1-S1 12/10/2016: LUMBAR PERCUTANEOUS PEDICLE SCREW 1 LEVEL; N/A     Comment:  Procedure: Extension of pedicle screw fixation to LUMBAR              FOUR;  Surgeon: Kevan Ny Ditty, MD;  Location: [place];  Service: Neurosurgery;  Laterality: N/A; 1997: NASAL SINUS SURGERY     Comment:  tuirbinate reduction   BMI    Body Mass Index:  28.16 kg/m      Reproductive/Obstetrics negative OB ROS                         Anesthesia Physical Anesthesia Plan  ASA: III  Anesthesia Plan: General LMA   Post-op Pain Management:    Induction:   PONV Risk Score and Plan: Dexamethasone, Ondansetron, Midazolam and Treatment may vary due to age or medical condition  Airway Management Planned:   Additional Equipment:   Intra-op Plan:   Post-operative Plan:   Informed Consent: I have reviewed the patients History and Physical, chart, labs and discussed the procedure including the risks, benefits and alternatives for the proposed anesthesia with the patient or authorized representative who has indicated his/her understanding and acceptance.   Dental Advisory Given  Plan Discussed with: Anesthesiologist  Anesthesia Plan Comments:  Anesthesia Quick Evaluation  

## 2018-10-06 NOTE — Anesthesia Procedure Notes (Signed)
Procedure Name: Intubation Date/Time: 10/06/2018 5:13 PM Performed by: Aline Brochure, CRNA Pre-anesthesia Checklist: Patient identified, Emergency Drugs available, Suction available and Patient being monitored Patient Re-evaluated:Patient Re-evaluated prior to induction Oxygen Delivery Method: Circle system utilized Preoxygenation: Pre-oxygenation with 100% oxygen Induction Type: IV induction Ventilation: Oral airway inserted - appropriate to patient size and Mask ventilation without difficulty Laryngoscope Size: Mac and 3 Grade View: Grade II Tube type: Oral Tube size: 7.0 mm Number of attempts: 1 Airway Equipment and Method: Stylet Placement Confirmation: ETT inserted through vocal cords under direct vision,  positive ETCO2 and breath sounds checked- equal and bilateral Secured at: 22 cm Tube secured with: Tape Dental Injury: Teeth and Oropharynx as per pre-operative assessment

## 2018-10-07 ENCOUNTER — Encounter: Payer: Self-pay | Admitting: Surgery

## 2018-10-07 ENCOUNTER — Other Ambulatory Visit: Payer: Self-pay | Admitting: Psychiatry

## 2018-10-07 DIAGNOSIS — F411 Generalized anxiety disorder: Secondary | ICD-10-CM

## 2018-10-08 LAB — SURGICAL PATHOLOGY

## 2018-12-07 ENCOUNTER — Encounter: Payer: Self-pay | Admitting: Psychiatry

## 2018-12-07 ENCOUNTER — Ambulatory Visit (HOSPITAL_BASED_OUTPATIENT_CLINIC_OR_DEPARTMENT_OTHER): Admitting: Psychiatry

## 2018-12-07 VITALS — BP 149/90 | HR 73 | Temp 97.5°F | Wt 163.4 lb

## 2018-12-07 DIAGNOSIS — Z9119 Patient's noncompliance with other medical treatment and regimen: Secondary | ICD-10-CM

## 2018-12-07 DIAGNOSIS — F3130 Bipolar disorder, current episode depressed, mild or moderate severity, unspecified: Secondary | ICD-10-CM

## 2018-12-07 DIAGNOSIS — F5105 Insomnia due to other mental disorder: Secondary | ICD-10-CM | POA: Diagnosis not present

## 2018-12-07 DIAGNOSIS — F411 Generalized anxiety disorder: Secondary | ICD-10-CM | POA: Diagnosis not present

## 2018-12-07 DIAGNOSIS — T43505A Adverse effect of unspecified antipsychotics and neuroleptics, initial encounter: Secondary | ICD-10-CM

## 2018-12-07 DIAGNOSIS — G2402 Drug induced acute dystonia: Secondary | ICD-10-CM | POA: Diagnosis not present

## 2018-12-07 DIAGNOSIS — Z91199 Patient's noncompliance with other medical treatment and regimen due to unspecified reason: Secondary | ICD-10-CM

## 2018-12-07 MED ORDER — BENZTROPINE MESYLATE 0.5 MG PO TABS: 0.5000 mg | ORAL_TABLET | Freq: Every day | ORAL | 1 refills | Status: DC

## 2018-12-07 MED ORDER — HYDROXYZINE HCL 25 MG PO TABS: 12.5000 mg | ORAL_TABLET | Freq: Three times a day (TID) | ORAL | 0 refills | Status: DC | PRN

## 2018-12-07 MED ORDER — ZOLPIDEM TARTRATE 10 MG PO TABS: ORAL_TABLET | ORAL | 0 refills | Status: DC

## 2018-12-07 MED ORDER — OXCARBAZEPINE 300 MG PO TABS: 600.0000 mg | ORAL_TABLET | Freq: Every day | ORAL | 1 refills | Status: DC

## 2018-12-07 MED ORDER — LURASIDONE HCL 40 MG PO TABS: 40.0000 mg | ORAL_TABLET | Freq: Every day | ORAL | 1 refills | Status: DC

## 2018-12-07 NOTE — Patient Instructions (Signed)
Lurasidone oral tablet What is this medicine? LURASIDONE (loo RAS i done) is an antipsychotic. It is used to treat schizophrenia or bipolar disorder, also known as manic-depression. This medicine may be used for other purposes; ask your health care provider or pharmacist if you have questions. COMMON BRAND NAME(S): Latuda What should I tell my health care provider before I take this medicine? They need to know if you have any of these conditions: -dementia -diabetes -difficulty swallowing -heart disease -history of breast cancer -kidney disease -liver disease -low blood counts, like low white cell, platelet, or red cell counts -low blood pressure -Parkinson's disease -seizures -suicidal thoughts, plans, or attempt; a previous suicide attempt by you or a family member -an unusual or allergic reaction to lurasidone, other medicines, foods, dyes, or preservatives -pregnant or trying to get pregnant -breast-feeding How should I use this medicine? Take this medicine by mouth with a glass of water. Follow the directions on the prescription label. Take this medicine with food. Take your medicine at regular intervals. Do not take it more often than directed. Do not stop taking except on your doctor's advice. Talk to your pediatrician regarding the use of this medicine in children. While this drug may be prescribed for children as young as 10 years for selected conditions, precautions do apply. Overdosage: If you think you have taken too much of this medicine contact a poison control center or emergency room at once. NOTE: This medicine is only for you. Do not share this medicine with others. What if I miss a dose? If you miss a dose, take it as soon as you can. If it is almost time for your next dose, take only that dose. Do not take double or extra doses. What may interact with this medicine? Do not take this medicine with any of the following medications: -avasimibe -certain medicines for  fungal infections like ketoconazole, voriconazole -certain medicines for seizures like carbamazepine, phenytoin -clarithromycin -mibefradil -rifampin -ritonavir -St. John's Wort This medicine may also interact with the following medications: -atazanavir -bosentan -certain medicines for anxiety or sleep -diltiazem -efavirenz -erythromycin -etravirine -fluconazole -grapefruit juice -medicines for blood pressure -modafinil -nafcillin -verapamil This list may not describe all possible interactions. Give your health care provider a list of all the medicines, herbs, non-prescription drugs, or dietary supplements you use. Also tell them if you smoke, drink alcohol, or use illegal drugs. Some items may interact with your medicine. What should I watch for while using this medicine? Visit your doctor or health care professional for regular checks on your progress. It may be several weeks before you see the full effects of this medicine. Notify your doctor or health care professional if your symptoms get worse, if you have new symptoms, if you are having an unusual effect from this medicine, or if you feel out of control, very discouraged or think you might harm yourself or others. Do not suddenly stop taking this medicine. You may need to gradually reduce the dose. Ask your doctor or health care professional for advice. You may get dizzy or drowsy. Do not drive, use machinery, or do anything that needs mental alertness until you know how this medicine affects you. Do not stand or sit up quickly, especially if you are an older patient. This reduces the risk of dizzy or fainting spells. Alcohol can increase dizziness and drowsiness. Avoid alcoholic drinks. This medicine may cause dry eyes and blurred vision. If you wear contact lenses you may feel some discomfort. Lubricating drops may  help. See your eye doctor if the problem does not go away or is severe. This medicine can reduce the response of your  body to heat or cold. Dress warm in cold weather and stay hydrated in hot weather. If possible, avoid extreme temperatures like saunas, hot tubs, very hot or cold showers, or activities that can cause dehydration such as vigorous exercise. What side effects may I notice from receiving this medicine? Side effects that you should report to your doctor or health care professional as soon as possible: -allergic reactions like skin rash, itching or hives, swelling of the face, lips, or tongue -abnormal production of milk -anxious -breast enlargement in both males and females -breathing problems -confusion -difficulty moving, slow movements, tremor -elevated mood, decreased need for sleep, racing thoughts, impulsive behavior -fever or chills, sore throat -males: trouble getting or maintaining an erection -missed or irregular menstrual periods -restlessness, pacing, inability to keep still or need to keep moving -seizures -signs and symptoms of high blood sugar such as dizziness; dry mouth; dry skin; fruity breath; nausea; stomach pain; increased hunger or thirst; increased urination -signs and symptoms of low blood pressure like dizziness; feeling faint or lightheaded, falls; unusually weak or tired -signs and symptoms of neuroleptic malignant syndrome such as confusion; fast or irregular heartbeat; high fever; increased sweating; stiff muscles -signs and symptoms of tardive dyskinesia such as uncontrollable head, mouth, neck, arm, or leg movements -sudden numbness or weakness of the face, arm or leg -suicidal thoughts or other mood changes -trouble sleeping -trouble swallowing Side effects that usually do not require medical attention (report to your doctor or health care professional if they continue or are bothersome): -drowsiness -nausea -runny nose -tiredness -weight gain This list may not describe all possible side effects. Call your doctor for medical advice about side effects. You may  report side effects to FDA at 1-800-FDA-1088. Where should I keep my medicine? Keep out of the reach of children. Store at room temperature between 15 and 30 degrees C (59 and 86 degrees F). Throw away any unused medicine after the expiration date. NOTE: This sheet is a summary. It may not cover all possible information. If you have questions about this medicine, talk to your doctor, pharmacist, or health care provider.  2019 Elsevier/Gold Standard (2017-01-15 14:51:02)
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
## 2018-12-22 ENCOUNTER — Telehealth: Payer: Self-pay | Admitting: Cardiovascular Disease

## 2018-12-22 ENCOUNTER — Other Ambulatory Visit: Payer: Self-pay | Admitting: Cardiovascular Disease

## 2018-12-22 NOTE — Telephone Encounter (Signed)
Patient needs an appointment for refills

## 2018-12-22 NOTE — Telephone Encounter (Signed)
Spoke with patient's husband Linna Hoff Will have patient call back to schedule an appointment

## 2018-12-22 NOTE — Telephone Encounter (Signed)
-----   Message from Janan Ridge, [place] sent at 12/22/2018 11:45 AM EST ----- Regarding: Appointment for refills Patient needs an appointment for further refills. If patient does not want to schedule an appointment please make them aware to contact PCP for refills.  Medications are  Atorvastatin 80 daily Lisinopril 20 daily Carvedilol 12.5 BID Amlodipine 5 BID   Thanks Ladies!

## 2018-12-23 ENCOUNTER — Other Ambulatory Visit: Payer: Self-pay

## 2018-12-23 ENCOUNTER — Ambulatory Visit (INDEPENDENT_AMBULATORY_CARE_PROVIDER_SITE_OTHER): Admitting: Psychiatry

## 2018-12-23 ENCOUNTER — Encounter: Payer: Self-pay | Admitting: Psychiatry

## 2018-12-23 VITALS — BP 125/85 | HR 80 | Temp 98.2°F | Wt 164.2 lb

## 2018-12-23 DIAGNOSIS — F411 Generalized anxiety disorder: Secondary | ICD-10-CM

## 2018-12-23 DIAGNOSIS — T43505A Adverse effect of unspecified antipsychotics and neuroleptics, initial encounter: Secondary | ICD-10-CM

## 2018-12-23 DIAGNOSIS — G2402 Drug induced acute dystonia: Secondary | ICD-10-CM

## 2018-12-23 DIAGNOSIS — F5105 Insomnia due to other mental disorder: Secondary | ICD-10-CM | POA: Diagnosis not present

## 2018-12-23 DIAGNOSIS — F3130 Bipolar disorder, current episode depressed, mild or moderate severity, unspecified: Secondary | ICD-10-CM

## 2018-12-23 MED ORDER — LURASIDONE HCL 40 MG PO TABS: 40.0000 mg | ORAL_TABLET | Freq: Every day | ORAL | 0 refills | Status: DC

## 2018-12-23 NOTE — Progress Notes (Signed)
[facility] MD OP Progress Note  12/23/2018 1:26 PM Jennifer [name]  MRN:  [id number]  Chief Complaint: ' I am here for follow up.' Chief Complaint    Follow-up     HPI: Jennifer [name] is a 63 year old Caucasian female, lives in [place], married, has a history of bipolar disorder, anxiety disorder, presented to clinic today for a follow-up visit.  Patient today reports she is tolerating the [place] well.  She takes it with her meals.  She denies any significant mood symptoms at this time.  She reports her anxiety and depressive symptoms is improving.  Patient does report some continued movements of her tongue.  She feels she has some chewing movements inside her mouth.  She is not sure if this is due to her problems with the teeth that she has been having lately.  She also does not know if this was due to the [place] which she was on before.  After the vraylar was stopped the movements have improved.  She has been taking the Cogentin.  Patient denies any suicidality, homicidality or perceptual disturbances.  Patient denies any other concerns today. Visit Diagnosis:    ICD-10-CM   1. Bipolar I disorder, most recent episode depressed (HCC) F31.30 lurasidone (LATUDA) 40 MG TABS tablet  2. Generalized anxiety disorder F41.1 lurasidone (LATUDA) 40 MG TABS tablet  3. 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 Insomnia due to mental condition F51.05   5. Noncompliance with treatment Z91.19     Past Psychiatric History: Reviewed past psychiatric history from my progress note on 02/05/2018.  Past Medical History:  Past Medical History:  Diagnosis Date  . Anemia 1985   bled during a cervical biopsy, led to bleed transfusion   . Anginal pain ([place])   . Anxiety   . Arthritis    pt. reports that her neck is stiff   . Bipolar depression ([place])   . Blood dyscrasia    reported hx [name] '85; saw hematologist Dr. Janese Banks 10/2016, VWD work-up WNL, not felt to have a primary bleeding disorder    . CAD (coronary artery disease)   . 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 COPD (chronic obstructive pulmonary disease) ([place])    dr. Gar Ponto [facility]    PCP  . CRD (chronic renal disease)   . Depression   . Depression with anxiety   . Diabetes mellitus   . Dyspnea    W/ EXERTION   . Enlarged liver   . Family history of adverse reaction to anesthesia    father woke up slowly  . Fibromyalgia   . Generalized anxiety disorder   . GERD (gastroesophageal reflux disease)   . Hyperlipidemia   . Hypertension   . Hypothyroidism   . Low back pain   . Low sodium levels   . Migraines   . Neuropathy   . Neuropathy   . Persistent disorder of initiating or maintaining sleep   . Restless leg   . Von Willebrand disease ([place])     Past Surgical History:  Procedure Laterality Date  . ABDOMINAL HYSTERECTOMY    . 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 APPLICATION OF ROBOTIC ASSISTANCE FOR SPINAL PROCEDURE N/A 12/10/2016   Procedure: APPLICATION OF ROBOTIC ASSISTANCE FOR SPINAL PROCEDURE;  Surgeon: Kevan Ny Ditty, MD;  Location: [facility];  Service: Neurosurgery;  Laterality: N/A;  . BACK SURGERY  2007   lumbar  . BREAST IMPLANT REMOVAL    . CARDIAC CATHETERIZATION     CAD,PCI of LAD, Cypher stent 2.5-28mm  . CARDIAC CATHETERIZATION     PCI of mid-LAD in stent restenosis with a drug-eluting stent  . CARDIAC CATHETERIZATION  06/03/13   [facility];MID LAD 100 % STENOSIS; PRIOR STENT; MID RCA 40 % STENOSIS  . COLONOSCOPY WITH PROPOFOL N/A 08/13/2018   Procedure:  COLONOSCOPY WITH PROPOFOL;  Surgeon: [name], MD;  Location: [facility] ENDOSCOPY;  Service: Gastroenterology;  Laterality: N/A;  . CORONARY ARTERY BYPASS GRAFT   08-12-2011   CABG x 3  . CORONARY STENT PLACEMENT    . ESOPHAGOGASTRODUODENOSCOPY (EGD) WITH PROPOFOL N/A 08/13/2018   Procedure: ESOPHAGOGASTRODUODENOSCOPY (EGD) WITH PROPOFOL;  Surgeon: [name], MD;  Location[facility];  Service: Gastroenterology;  Laterality: N/A;  . LUMBAR FUSION     L1-S1  . LUMBAR PERCUTANEOUS PEDICLE SCREW 1 LEVEL N/A 12/10/2016   Procedure: Extension of pedicle screw fixation to LUMBAR FOUR;  Surgeon: Kevan Ny Ditty, MD;  Location: [facility];  Service: Neurosurgery;  Laterality: N/A;  . NASAL SINUS SURGERY  1997   tuirbinate reduction   . RECTAL EXAM UNDER ANESTHESIA N/A 10/06/2018   Procedure: RECTAL EXAM UNDER ANESTHESIA;  Surgeon: Jules Husbands, MD;  Location: [facility] ORS;  Service: General;  Laterality: N/A;    Family Psychiatric History: Have reviewed family psychiatric history from my progress note on 02/05/2018  Family History:  Family History  Problem Relation Age of Onset  . Aneurysm Father        abdominal  . Diabetes Father   . Alcohol abuse Father   . Hypertension Father   . Aneurysm Brother        abdominal  . Alcohol abuse Brother   . Hypertension Brother   . Colon cancer Brother   . Diabetes Mother   . Alcohol abuse Mother   . Hypertension Other        family hx  . Hyperlipidemia Other        family hx  . Diabetes Other        family hx    Social History: Reviewed social history from my progress note on 02/05/2018 Social History   Socioeconomic History  . Marital status: Married    Spouse name: Photographer  . Number of children: 3  . Years of education: Not on file  . Highest education level: Associate degree: occupational, Hotel manager, or vocational program  Occupational History  . Not on file  Social Needs  . Financial resource strain: Not on file  . Food  insecurity:    Worry: Not on file    Inability: Not on file  . Transportation needs:    Medical: No    Non-medical: No  Tobacco Use  . Smoking status: Former Smoker    Years: 16.00    Types: Cigarettes    Last attempt to quit: 06/20/2010    Years since quitting: 8.5  . Smokeless tobacco: Never Used  Substance and Sexual Activity  . Alcohol use: No    Alcohol/week: 0.0 standard drinks  . Drug use: No  .  Sexual activity: Yes  Lifestyle  . Physical activity:    Days per week: 0 days    Minutes per session: 0 min  . Stress: Rather much  Relationships  . Social connections:    Talks on phone: Not on file    Gets together: Not on file    Attends religious service: More than 4 times per year    Active member of club or organization: No    Attends meetings of clubs or organizations: Never    Relationship status: Married  Other Topics Concern  . Not on file  Social History Narrative  . Not on file    Allergies: No Known Allergies  Metabolic Disorder Labs: Lab Results  Component Value Date   HGBA1C 9.4 (H) 12/05/2017   MPG 223.08 12/05/2017   MPG 197 10/30/2016   Lab Results  Component Value Date   PROLACTIN 6.1 12/05/2017   Lab Results  Component Value Date   CHOL 238 (H) 12/05/2017   TRIG 819 (H) 12/05/2017   HDL 41 12/05/2017   CHOLHDL 5.8 12/05/2017   VLDL UNABLE TO CALCULATE IF TRIGLYCERIDE OVER 400 mg/dL 12/05/2017   LDLCALC UNABLE TO CALCULATE IF TRIGLYCERIDE OVER 400 mg/dL 12/05/2017   LDLCALC SEE COMMENT 07/09/2012   Lab Results  Component Value Date   TSH 2.600 08/03/2018   TSH 86.000 (H) 12/05/2017    Therapeutic Level Labs: No results found for: LITHIUM No results found for: VALPROATE No components found for:  CBMZ  Current Medications: Current Outpatient Medications  Medication Sig Dispense Refill  . albuterol-ipratropium (COMBIVENT) 18-103 MCG/ACT inhaler Inhale 2 puffs into the lungs daily as needed for wheezing or shortness of breath.      Marland Kitchen amLODipine (NORVASC) 5 MG tablet Take 1 tablet (5 mg total) by mouth 2 (two) times daily. 180 tablet 4  . atorvastatin (LIPITOR) 80 MG tablet Take 1 tablet (80 mg total) by mouth daily. 90 tablet 3  . benztropine (COGENTIN) 0.5 MG tablet Take 1 tablet (0.5 mg total) by mouth daily. 90 tablet 1  . carisoprodol (SOMA) 350 MG tablet Take 1 tablet (350 mg total) by mouth every 6 (six) hours as needed for muscle spasms. (Patient taking differently: Take 350 mg by mouth 2 (two) times daily as needed for muscle spasms. ) 120 tablet 0  . carvedilol (COREG) 12.5 MG tablet Take 1 tablet (12.5 mg total) by mouth 2 (two) times daily with a meal. 180 tablet 3  . dicyclomine (BENTYL) 10 MG capsule Take 1 capsule (10 mg total) by mouth 4 (four) times daily -  before meals and at bedtime. 90 capsule 0  . FREESTYLE LITE test strip     . HUMULIN 70/30 KWIKPEN (70-30) 100 UNIT/ML PEN Inject 10-15 Units into the skin See admin instructions. Inject 10-15 units SQ in the morning and inject 10-12 units SQ at bedtime per sliding scale    . HYDROcodone-acetaminophen (NORCO/VICODIN) 5-325 MG tablet Take 1 tablet by mouth every 6 (six) hours as needed for moderate pain. 12 tablet 0  . hydrOXYzine (ATARAX/VISTARIL) 25 MG tablet Take 0.5-1 tablets (12.5-25 mg total) by mouth 3 (three) times daily as needed for anxiety. 270 tablet 0  . lisinopril (PRINIVIL,ZESTRIL) 20 MG tablet Take 1 tablet (20 mg total) by mouth daily. 90 tablet 3  . lurasidone (LATUDA) 40 MG TABS tablet Take 1 tablet (40 mg total) by mouth daily with supper. 90 tablet 0  . LYRICA 50 MG capsule Take 50 mg by mouth  2 (two) times daily.     . metFORMIN (GLUCOPHAGE-XR) 750 MG 24 hr tablet Take by mouth.    . mirtazapine (REMERON) 15 MG tablet Take 1 tablet (15 mg total) by mouth at bedtime. 90 tablet 1  . nitroGLYCERIN (NITROSTAT) 0.4 MG SL tablet Place 1 tablet (0.4 mg total) under the tongue every 5 (five) minutes as needed for chest pain. 25 tablet 6  .  omeprazole (PRILOSEC) 20 MG capsule Take 20 mg by mouth daily.     . Oxcarbazepine (TRILEPTAL) 300 MG tablet Take 2 tablets (600 mg total) by mouth at bedtime. 180 tablet 1  . oxyCODONE (OXY IR/ROXICODONE) 5 MG immediate release tablet Take 5 mg by mouth See admin instructions. Take 5 mg by mouth up to 5 times daily as needed for severe pain    . oxyCODONE-acetaminophen (PERCOCET) 10-325 MG tablet Take by mouth.    . RESTASIS 0.05 % ophthalmic emulsion Place 1 drop into both eyes daily as needed (for dryness).     Marland Kitchen rOPINIRole (REQUIP) 0.5 MG tablet Take 1 mg by mouth at bedtime.     Marland Kitchen SYNTHROID 25 MCG tablet Take 25 mcg by mouth daily before breakfast.     . TRULICITY 1.5 [id number] SOPN     . zolpidem (AMBIEN) 10 MG tablet TAKE 1/2 TO 1 (ONE-HALF TO ONE) TABLET BY MOUTH AT BEDTIME AS NEEDED FOR SLEEP 90 tablet 0  . sitaGLIPtin (JANUVIA) 100 MG tablet Take 100 mg by mouth daily.     No current facility-administered medications for this visit.      Musculoskeletal: Strength & Muscle Tone: within normal limits Gait & Station: normal Patient leans: N/A  Psychiatric Specialty Exam: Review of Systems  Psychiatric/Behavioral: The patient is nervous/anxious (improving).   All other systems reviewed and are negative.   Blood pressure 125/85, pulse 80, temperature 98.2 F (36.8 C), temperature source Oral, weight 164 lb 3.2 oz (74.5 kg).Body mass index is 29.09 kg/m.  General Appearance: Casual  Eye Contact:  Fair  Speech:  Clear and Coherent  Volume:  Normal  Mood:  Euthymic  Affect:  Congruent  Thought Process:  Goal Directed and Descriptions of Associations: Intact  Orientation:  Full (Time, Place, and Person)  Thought Content: Logical   Suicidal Thoughts:  No  Homicidal Thoughts:  No  Memory:  Immediate;   Fair Recent;   Fair Remote;   Fair  Judgement:  Fair  Insight:  Fair  Psychomotor Activity:  Normal  Concentration:  Concentration: Fair and Attention Span: Fair  Recall:   [employer] of Knowledge: Fair  Language: Fair  Akathisia:  No  Handed:  Right  AIMS (if indicated): 5, unknown if this is due to her problems with teeth  Assets:  Communication Skills Desire for Improvement Social Support  ADL's:  Intact  Cognition: WNL  Sleep:  Fair   Screenings: AIMS     Office Visit from 12/23/2018 in [facility] Visit from 08/18/2018 in [facility] Total Score  5  6    PHQ2-9     Office Visit from 05/09/2016 in [facility] Procedure visit from 03/20/2016 in [facility] Office Visit from 03/12/2016 in [facility] from 02/13/2016 in [facility] Office Visit from 12/14/2015 in [facility]  PHQ-2 Total Score  0  0  0  0  0       Assessment and Plan: Krishika is a 63 year old Caucasian female who has a history of bipolar disorder, presented to clinic today for a follow-up visit.  Patient is currently making progress on the current medication regimen.  She continues to have some movement problems likely due to the medications.  We will continue to monitor closely.  Plan as noted below.  Plan Bipolar disorder-improving Continue Trileptal 600 mg p.o. daily Latuda 40 mg p.o. daily with supper  For neuroleptic induced acute dystonia- some progress Aims equals 5 Cogentin 0.5 mg p.o. daily.  Discussed with patient to take an extra dose as needed.  Anxiety disorder-improving Mirtazapine 15 mg p.o. nightly Hydroxyzine 12.5 to 25 mg p.o. 3 times daily as needed  For insomnia-improving Ambien and mirtazapine as prescribed.  She is on Ambien 10 mg p.o. nightly  Patient advised to start psychotherapy sessions with Ms. Peacock again.  Follow-up in clinic in 6 weeks or sooner  if needed.  I have spent atleast 15 minutes face to face with patient today. More than 50 % of the time was spent for psychoeducation and supportive psychotherapy and care coordination.  This note was generated in part or whole with voice recognition software. Voice recognition is usually quite accurate but there are transcription errors that can and very often do occur. I apologize for any typographical errors that were not detected and corrected.        Ursula Alert, MD 12/23/2018, 1:26 PM

## 2019-01-20 ENCOUNTER — Ambulatory Visit: Admitting: Licensed Clinical Social Worker

## 2019-01-27 ENCOUNTER — Telehealth (INDEPENDENT_AMBULATORY_CARE_PROVIDER_SITE_OTHER): Admitting: Cardiovascular Disease

## 2019-01-27 ENCOUNTER — Other Ambulatory Visit: Payer: Self-pay

## 2019-01-27 ENCOUNTER — Telehealth: Payer: Self-pay | Admitting: Cardiovascular Disease

## 2019-01-27 DIAGNOSIS — E1159 Type 2 diabetes mellitus with other circulatory complications: Secondary | ICD-10-CM

## 2019-01-27 DIAGNOSIS — F17200 Nicotine dependence, unspecified, uncomplicated: Secondary | ICD-10-CM

## 2019-01-27 DIAGNOSIS — I25118 Atherosclerotic heart disease of native coronary artery with other forms of angina pectoris: Secondary | ICD-10-CM | POA: Diagnosis not present

## 2019-01-27 DIAGNOSIS — I10 Essential (primary) hypertension: Secondary | ICD-10-CM

## 2019-01-27 DIAGNOSIS — E782 Mixed hyperlipidemia: Secondary | ICD-10-CM

## 2019-01-27 MED ORDER — LISINOPRIL 20 MG PO TABS: 20.0000 mg | ORAL_TABLET | Freq: Every day | ORAL | 0 refills | Status: DC

## 2019-01-27 MED ORDER — ATORVASTATIN CALCIUM 80 MG PO TABS: 80.0000 mg | ORAL_TABLET | Freq: Every day | ORAL | 3 refills | Status: DC

## 2019-01-27 MED ORDER — CARVEDILOL 12.5 MG PO TABS: 12.5000 mg | ORAL_TABLET | Freq: Two times a day (BID) | ORAL | 3 refills | Status: DC

## 2019-01-27 MED ORDER — AMLODIPINE BESYLATE 5 MG PO TABS: 5.0000 mg | ORAL_TABLET | Freq: Two times a day (BID) | ORAL | 3 refills | Status: DC

## 2019-01-27 MED ORDER — ASPIRIN EC 81 MG PO TBEC: 81.0000 mg | DELAYED_RELEASE_TABLET | Freq: Every day | ORAL | Status: DC

## 2019-01-27 NOTE — Addendum Note (Signed)
Addended by: Alvis Lemmings C on: 01/27/2019 05:33 PM   Modules accepted: Orders

## 2019-01-27 NOTE — Patient Instructions (Signed)
Medication Instructions:  Start aspirin 81 mg daily  Refill cardiac meds, express scripts, 90 days, refill  If you need a refill on your cardiac medications before your next appointment, please call your pharmacy.    Lab work: No new labs needed   If you have labs (blood work) drawn today and your tests are completely normal, you will receive your results only by: Marland Kitchen MyChart Message (if you have MyChart) OR . A paper copy in the mail If you have any lab test that is abnormal or we need to change your treatment, we will call you to review the results.   Testing/Procedures: No new testing needed   Follow-Up: At [facility], you and your health needs are our priority.  As part of our continuing mission to provide you with exceptional heart care, we have created designated Provider Care Teams.  These Care Teams include your primary Cardiologist (physician) and Advanced Practice Providers (APPs -  Physician Assistants and Nurse Practitioners) who all work together to provide you with the care you need, when you need it.  . You will need a follow up appointment in 12 months .   Please call our office 2 months in advance to schedule this appointment.    . Providers on your designated Care Team:   . Murray Hodgkins, NP . Christell Faith, PA-C . Marrianne Mood, PA-C  Any Other Special Instructions Will Be Listed Below (If Applicable).  For educational health videos Log in to : [email or web address] Or : [email or web address], password : [email or web address]

## 2019-01-27 NOTE — Progress Notes (Addendum)
Virtual Visit via Telephone Note   This visit type was conducted due to national recommendations for restrictions regarding the COVID-19 Pandemic (e.g. social distancing) in an effort to limit this patient's exposure and mitigate transmission in our community.  Due to her co-morbid illnesses, this patient is at least at moderate risk for complications without adequate follow up.  This format is felt to be most appropriate for this patient at this time.  The patient did not have access to video technology/had technical difficulties with video requiring transitioning to audio format only (telephone).  All issues noted in this document were discussed and addressed.  No physical exam could be performed with this format.  Please refer to the patient's chart for her  consent to telehealth for [facility].    Date:  01/27/2019   [name], DOB [birth year], MRN [id number]  Patient Location:  [place]  [id number]   Provider location:   [facility], [place] office  PCP:  Valera Castle, MD  Cardiologist:  Rockey Situ  Chief Complaint: Angina chest pain    History of Present Illness:    Jennifer [name] is a 63 y.o. female who presents via audio/video conferencing for a telehealth visit today.   The patient does not symptoms concerning for COVID-19 infection (fever, chills, cough, or new SHORTNESS OF BREATH).  Please refer to prior office visit for complete details: Patient has a past medical history of  Medication noncompliance coronary artery disease,  PTCA of the LAD with 2.5 x 28 mm Cypher stent in January 2007,  Repeat catheterization August 2011 with Stent placement of the mid LAD for 90% lesion, catheterization August 06, 2011 for c home hest pain that showed severe mid LAD in-stent restenosis at the site of the previous drug-eluting stent ( DES stent x2 placed in the LAD in 2007 and 2011), also with severe mid RCA disease, moderate to severe  proximal diagonal #1 disease, ejection fraction 55%. hospitalization at [facility] on 12/09/2013 bypass surgery at [place] on August 12 2011 by Dr. Roxan Hockey. bypass graft x3 with a LIMA to the LAD, vein graft to the RCA, vein graft to the diagonal. She presents for routine follow-up of her coronary artery disease  3 to 4 weeks ago, was in [employer], Bad chest pain, "hand squeezing her heart" Happened twice over the past several months Lots of stress, son living with them  Other episode of chest pain several months ago Has NTG,  took two, pain resolved  Activity does not bring on pain Sometimes with SOB, chronic issue  On meds for chronic pain  Pulled 2 teeth recently  BP "ok" Monitors at home, does not know the numbers   Prior CV studies:   The following studies were reviewed today:  Last catheterization June 03, 2013 Diabetes poorly controlled   Stress test 04/2016 No ischemia   Past Medical History:  Diagnosis Date  . Anemia 1985   bled during a cervical biopsy, led to bleed transfusion   . Anginal pain ([place])   . Anxiety   . Arthritis    pt. reports that her neck is stiff   . Bipolar depression ([place])   . Blood dyscrasia    reported hx [name] '85; saw hematologist Dr. Janese Banks 10/2016, VWD work-up WNL, not felt to have a primary bleeding disorder   . CAD (coronary artery disease)   . Cancer ([place])    skin cancer on the  ear, cervical dysplasia   . COPD (chronic obstructive pulmonary disease) ([place])    dr. Gar Ponto [facility]    PCP  . CRD (chronic renal disease)   . Depression   . Depression with anxiety   . Diabetes mellitus   . Dyspnea    W/ EXERTION   . Enlarged liver   . Family history of adverse reaction to anesthesia    father woke up slowly  . Fibromyalgia   . Generalized anxiety disorder   . GERD (gastroesophageal reflux disease)   . Hyperlipidemia   . Hypertension   . Hypothyroidism   . Low back pain   . Low sodium levels    . Migraines   . Neuropathy   . Neuropathy   . Persistent disorder of initiating or maintaining sleep   . Restless leg   . Von Willebrand disease ([place])    Past Surgical History:  Procedure Laterality Date  . ABDOMINAL HYSTERECTOMY    . 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 APPLICATION OF ROBOTIC ASSISTANCE FOR SPINAL PROCEDURE N/A 12/10/2016   Procedure: APPLICATION OF ROBOTIC ASSISTANCE FOR SPINAL PROCEDURE;  Surgeon: Kevan Ny Ditty, MD;  Location: [place];  Service: Neurosurgery;  Laterality: N/A;  . BACK SURGERY  2007   lumbar  . BREAST IMPLANT REMOVAL    . CARDIAC CATHETERIZATION     CAD,PCI of LAD, Cypher stent 2.5-28mm  . CARDIAC CATHETERIZATION     PCI of mid-LAD in stent restenosis with a drug-eluting stent  . CARDIAC CATHETERIZATION  06/03/13   [facility];MID LAD 100 % STENOSIS; PRIOR STENT; MID RCA 40 % STENOSIS  . COLONOSCOPY WITH PROPOFOL N/A 08/13/2018   Procedure: COLONOSCOPY WITH PROPOFOL;  Surgeon: [name], MD;  Location: [facility] ENDOSCOPY;  Service: Gastroenterology;  Laterality: N/A;  . CORONARY ARTERY BYPASS GRAFT   08-12-2011   CABG x 3  . CORONARY STENT PLACEMENT    . ESOPHAGOGASTRODUODENOSCOPY (EGD) WITH PROPOFOL N/A 08/13/2018   Procedure: ESOPHAGOGASTRODUODENOSCOPY (EGD) WITH PROPOFOL;  Surgeon: [name], MD;  Location: [facility] ENDOSCOPY;  Service: Gastroenterology;  Laterality: N/A;  . LUMBAR FUSION     L1-S1  . LUMBAR PERCUTANEOUS PEDICLE SCREW 1 LEVEL N/A 12/10/2016   Procedure: Extension of pedicle screw fixation to LUMBAR FOUR;  Surgeon: Kevan Ny  Ditty, MD;  Location: [place];  Service: Neurosurgery;  Laterality: N/A;  . NASAL SINUS SURGERY  1997   tuirbinate reduction   . RECTAL EXAM UNDER ANESTHESIA N/A 10/06/2018   Procedure: RECTAL EXAM UNDER ANESTHESIA;  Surgeon: Jules Husbands, MD;  Location: [facility] ORS;  Service: General;  Laterality: N/A;     Current Meds  Medication Sig  . albuterol-ipratropium (COMBIVENT) 18-103 MCG/ACT inhaler Inhale 2 puffs into the lungs daily as needed for wheezing or shortness of breath.   Marland Kitchen amLODipine (NORVASC) 5 MG tablet Take 1 tablet (5 mg total) by mouth 2 (two) times daily. Please make annual appt for future refills. Thank you  . atorvastatin (LIPITOR) 80 MG tablet Take 1 tablet (80 mg total) by mouth daily. Please make annual appt for future refills. Thank you  .  benztropine (COGENTIN) 0.5 MG tablet Take 1 tablet (0.5 mg total) by mouth daily.  . carisoprodol (SOMA) 350 MG tablet Take 1 tablet (350 mg total) by mouth every 6 (six) hours as needed for muscle spasms. (Patient taking differently: Take 350 mg by mouth 2 (two) times daily as needed for muscle spasms. )  . carvedilol (COREG) 12.5 MG tablet Take 1 tablet (12.5 mg total) by mouth 2 (two) times daily with a meal. Please make annual appt for future refills. Thank you  . dicyclomine (BENTYL) 10 MG capsule Take 1 capsule (10 mg total) by mouth 4 (four) times daily -  before meals and at bedtime.  Marland Kitchen FREESTYLE LITE test strip   . HUMULIN 70/30 KWIKPEN (70-30) 100 UNIT/ML PEN Inject 10-15 Units into the skin See admin instructions. Inject 10-15 units SQ in the morning and inject 10-12 units SQ at bedtime per sliding scale  . HYDROcodone-acetaminophen (NORCO/VICODIN) 5-325 MG tablet Take 1 tablet by mouth every 6 (six) hours as needed for moderate pain.  . hydrOXYzine (ATARAX/VISTARIL) 25 MG tablet Take 0.5-1 tablets (12.5-25 mg total) by mouth 3 (three) times daily as needed for anxiety.  Marland Kitchen lisinopril (PRINIVIL,ZESTRIL) 20 MG tablet Take 1 tablet (20  mg total) by mouth daily. Please make annual appt for future refills. Thank you  . lurasidone (LATUDA) 40 MG TABS tablet Take 1 tablet (40 mg total) by mouth daily with supper.  Marland Kitchen LYRICA 50 MG capsule Take 50 mg by mouth 2 (two) times daily.   . metFORMIN (GLUCOPHAGE-XR) 750 MG 24 hr tablet Take by mouth.  . mirtazapine (REMERON) 15 MG tablet Take 1 tablet (15 mg total) by mouth at bedtime.  . nitroGLYCERIN (NITROSTAT) 0.4 MG SL tablet Place 1 tablet (0.4 mg total) under the tongue every 5 (five) minutes as needed for chest pain.  Marland Kitchen omeprazole (PRILOSEC) 20 MG capsule Take 20 mg by mouth daily.   . Oxcarbazepine (TRILEPTAL) 300 MG tablet Take 2 tablets (600 mg total) by mouth at bedtime.  Marland Kitchen oxyCODONE (OXY IR/ROXICODONE) 5 MG immediate release tablet Take 5 mg by mouth See admin instructions. Take 5 mg by mouth up to 5 times daily as needed for severe pain  . oxyCODONE-acetaminophen (PERCOCET) 10-325 MG tablet Take by mouth.  . RESTASIS 0.05 % ophthalmic emulsion Place 1 drop into both eyes daily as needed (for dryness).   Marland Kitchen rOPINIRole (REQUIP) 0.5 MG tablet Take 1 mg by mouth at bedtime.   Marland Kitchen SYNTHROID 25 MCG tablet Take 25 mcg by mouth daily before breakfast.   . TRULICITY 1.5 CN/4.7SJ SOPN   . zolpidem (AMBIEN) 10 MG tablet TAKE 1/2 TO 1 (ONE-HALF TO ONE) TABLET BY MOUTH AT BEDTIME AS NEEDED FOR SLEEP     Allergies:   Patient has no known allergies.   Social History   Tobacco Use  . Smoking status: Former Smoker    Years: 16.00    Types: Cigarettes    Last attempt to quit: 06/20/2010    Years since quitting: 8.6  . Smokeless tobacco: Never Used  Substance Use Topics  . Alcohol use: No    Alcohol/week: 0.0 standard drinks  . Drug use: No     Current Outpatient Medications on File Prior to Visit  Medication Sig Dispense Refill  . albuterol-ipratropium (COMBIVENT) 18-103 MCG/ACT inhaler Inhale 2 puffs into the lungs daily as needed for wheezing or shortness of breath.     Marland Kitchen  amLODipine (NORVASC) 5 MG tablet Take 1 tablet (5  mg total) by mouth 2 (two) times daily. Please make annual appt for future refills. Thank you 180 tablet 0  . atorvastatin (LIPITOR) 80 MG tablet Take 1 tablet (80 mg total) by mouth daily. Please make annual appt for future refills. Thank you 90 tablet 0  . benztropine (COGENTIN) 0.5 MG tablet Take 1 tablet (0.5 mg total) by mouth daily. 90 tablet 1  . carisoprodol (SOMA) 350 MG tablet Take 1 tablet (350 mg total) by mouth every 6 (six) hours as needed for muscle spasms. (Patient taking differently: Take 350 mg by mouth 2 (two) times daily as needed for muscle spasms. ) 120 tablet 0  . carvedilol (COREG) 12.5 MG tablet Take 1 tablet (12.5 mg total) by mouth 2 (two) times daily with a meal. Please make annual appt for future refills. Thank you 180 tablet 0  . dicyclomine (BENTYL) 10 MG capsule Take 1 capsule (10 mg total) by mouth 4 (four) times daily -  before meals and at bedtime. 90 capsule 0  . FREESTYLE LITE test strip     . HUMULIN 70/30 KWIKPEN (70-30) 100 UNIT/ML PEN Inject 10-15 Units into the skin See admin instructions. Inject 10-15 units SQ in the morning and inject 10-12 units SQ at bedtime per sliding scale    . HYDROcodone-acetaminophen (NORCO/VICODIN) 5-325 MG tablet Take 1 tablet by mouth every 6 (six) hours as needed for moderate pain. 12 tablet 0  . hydrOXYzine (ATARAX/VISTARIL) 25 MG tablet Take 0.5-1 tablets (12.5-25 mg total) by mouth 3 (three) times daily as needed for anxiety. 270 tablet 0  . lisinopril (PRINIVIL,ZESTRIL) 20 MG tablet Take 1 tablet (20 mg total) by mouth daily. Please make annual appt for future refills. Thank you 90 tablet 0  . lurasidone (LATUDA) 40 MG TABS tablet Take 1 tablet (40 mg total) by mouth daily with supper. 90 tablet 0  . LYRICA 50 MG capsule Take 50 mg by mouth 2 (two) times daily.     . metFORMIN (GLUCOPHAGE-XR) 750 MG 24 hr tablet Take by mouth.    . mirtazapine (REMERON) 15 MG tablet Take 1  tablet (15 mg total) by mouth at bedtime. 90 tablet 1  . nitroGLYCERIN (NITROSTAT) 0.4 MG SL tablet Place 1 tablet (0.4 mg total) under the tongue every 5 (five) minutes as needed for chest pain. 25 tablet 6  . omeprazole (PRILOSEC) 20 MG capsule Take 20 mg by mouth daily.     . Oxcarbazepine (TRILEPTAL) 300 MG tablet Take 2 tablets (600 mg total) by mouth at bedtime. 180 tablet 1  . oxyCODONE (OXY IR/ROXICODONE) 5 MG immediate release tablet Take 5 mg by mouth See admin instructions. Take 5 mg by mouth up to 5 times daily as needed for severe pain    . oxyCODONE-acetaminophen (PERCOCET) 10-325 MG tablet Take by mouth.    . RESTASIS 0.05 % ophthalmic emulsion Place 1 drop into both eyes daily as needed (for dryness).     Marland Kitchen rOPINIRole (REQUIP) 0.5 MG tablet Take 1 mg by mouth at bedtime.     Marland Kitchen SYNTHROID 25 MCG tablet Take 25 mcg by mouth daily before breakfast.     . TRULICITY 1.5 ZO/1.0RU SOPN     . zolpidem (AMBIEN) 10 MG tablet TAKE 1/2 TO 1 (ONE-HALF TO ONE) TABLET BY MOUTH AT BEDTIME AS NEEDED FOR SLEEP 90 tablet 0  . sitaGLIPtin (JANUVIA) 100 MG tablet Take 100 mg by mouth daily.     No current facility-administered medications on file prior  to visit.      Family Hx: The patient's family history includes Alcohol abuse in her brother, father, and mother; Aneurysm in her brother and father; Colon cancer in her brother; Diabetes in her father, mother, and another family member; Hyperlipidemia in an other family member; Hypertension in her brother, father, and another family member.  ROS:   Please see the history of present illness.    Review of Systems  Constitutional: Negative.   Respiratory: Negative.   Cardiovascular: Positive for chest pain.  Gastrointestinal: Negative.   Musculoskeletal: Negative.   Neurological: Negative.   Psychiatric/Behavioral: Negative.   All other systems reviewed and are negative.    Labs/Other Tests and Data Reviewed:    Recent Labs: 07/06/2018:  Magnesium 2.1 08/03/2018: ALT 15; BUN 10; Creatinine, Ser 0.67; Hemoglobin 12.4; Platelets 423; Potassium 3.8; Sodium 127; TSH 2.600   Recent Lipid Panel Lab Results  Component Value Date/Time   CHOL 238 (H) 12/05/2017 11:30 AM   CHOL 200 07/09/2012 06:38 AM   TRIG 819 (H) 12/05/2017 11:30 AM   TRIG 679 (H) 07/09/2012 06:38 AM   HDL 41 12/05/2017 11:30 AM   HDL 20 (L) 07/09/2012 06:38 AM   CHOLHDL 5.8 12/05/2017 11:30 AM   LDLCALC UNABLE TO CALCULATE IF TRIGLYCERIDE OVER 400 mg/dL 12/05/2017 11:30 AM   LDLCALC SEE COMMENT 07/09/2012 06:38 AM    Wt Readings from Last 3 Encounters:  10/06/18 158 lb 15.2 oz (72.1 kg)  10/01/18 159 lb (72.1 kg)  09/23/18 161 lb (73 kg)     Exam:    Vital Signs:  There were no vitals taken for this visit.   Well nourished, well developed female in no acute distress.   ASSESSMENT & PLAN:    Coronary artery disease of native artery of native heart with stable angina pectoris (HCC) 2 episodes of chest pain over the past several months  Took nitro for 1 of the episodes Otherwise has been relatively active with no reproducible chest pain Recommended she call our office if she has recurrent episodes or go to the emergency room Could perform stress test or catheterization for recurrent symptoms concerning for angina  Type 2 diabetes mellitus with other circulatory complication, without long-term current use of insulin ([place]) Long history of poorly controlled diabetes Stressed importance of taking her medication  Essential hypertension Reports blood pressure well controlled, though does report sometimes it does go up She does not have any numbers for [place] Medications refilled  Mixed hyperlipidemia Stressed importance of taking her Lipitor Previous total cholesterol 1 year ago was markedly elevated We will refill her medications  SMOKER We have encouraged her to continue to work on weaning her cigarettes and smoking cessation. She will continue to  work on this and does not want any assistance with chantix.    COVID-19 Education: The signs and symptoms of COVID-19 were discussed with the patient and how to seek care for testing (follow up with PCP or arrange E-visit).  The importance of social distancing was discussed today.  Patient Risk:   After full review of this patients clinical status, I feel that they are at least moderate risk at this time.  Time:   Today, I have spent 25 minutes with the patient with telehealth technology discussing .     Medication Adjustments/Labs and Tests Ordered: Current medicines are reviewed at length with the patient today.  Concerns regarding medicines are outlined above.   Tests Ordered: No tests ordered   Medication Changes: No  changes made   Disposition: Follow-up in 6 months   Signed, Ida Rogue, MD  01/27/2019 5:06 PM    [place] [facility] [address] [place] [address], [place], [place] [address]

## 2019-01-27 NOTE — Telephone Encounter (Signed)
Tried to return call to patient.  No answer. No voicemail

## 2019-01-27 NOTE — Telephone Encounter (Signed)
° ° °  Please return call to patient °

## 2019-02-03 ENCOUNTER — Other Ambulatory Visit: Payer: Self-pay

## 2019-02-03 ENCOUNTER — Ambulatory Visit (INDEPENDENT_AMBULATORY_CARE_PROVIDER_SITE_OTHER): Admitting: Psychiatry

## 2019-02-03 ENCOUNTER — Encounter: Payer: Self-pay | Admitting: Psychiatry

## 2019-02-03 DIAGNOSIS — F411 Generalized anxiety disorder: Secondary | ICD-10-CM | POA: Diagnosis not present

## 2019-02-03 DIAGNOSIS — F5105 Insomnia due to other mental disorder: Secondary | ICD-10-CM

## 2019-02-03 DIAGNOSIS — F3130 Bipolar disorder, current episode depressed, mild or moderate severity, unspecified: Secondary | ICD-10-CM

## 2019-02-03 DIAGNOSIS — G2402 Drug induced acute dystonia: Secondary | ICD-10-CM | POA: Diagnosis not present

## 2019-02-03 DIAGNOSIS — T43505A Adverse effect of unspecified antipsychotics and neuroleptics, initial encounter: Secondary | ICD-10-CM

## 2019-02-03 MED ORDER — MIRTAZAPINE 15 MG PO TABS: 22.5000 mg | ORAL_TABLET | Freq: Every day | ORAL | 1 refills | Status: DC

## 2019-02-03 NOTE — Progress Notes (Signed)
Virtual Visit via Telephone Note  I connected with Jennifer [name] on 02/03/19 at 11:00 AM EDT by telephone and verified that I am speaking with the correct person using two identifiers.   I discussed the limitations, risks, security and privacy concerns of performing an evaluation and management service by telephone and the availability of in person appointments. I also discussed with the patient that there may be a patient responsible charge related to this service. The patient expressed understanding and agreed to proceed.   I discussed the assessment and treatment plan with the patient. The patient was provided an opportunity to ask questions and all were answered. The patient agreed with the plan and demonstrated an understanding of the instructions.   The patient was advised to call back or seek an in-person evaluation if the symptoms worsen or if the condition fails to improve as anticipated.  I provided 15 minutes of non-face-to-face time during this encounter.   Ursula Alert, MD  [facility] MD OP Progress Note  02/03/2019 5:06 PM Jennifer [name]  MRN:  [id number]  Chief Complaint:  Chief Complaint    Follow-up; Medication Refill     HPI: Jennifer [name] is a 63 year old Caucasian female, lives in [place], married, has a history of bipolar disorder, generalized anxiety disorder, insomnia, neuroleptic induced acute dystonia, was evaluated by telemedicine today.  Patient today reports prefer to do a phone consult.  She reports that she does not have Internet access and could not do a video consult today.  She reports she and her family are mildly anxious due to the current COVID-19 crisis.  She is trying to stay indoor mostly.  She however reports she has 11 acres of property around her house and hence has been spending some time taking walks and doing some yard work.  Patient reports she continues to have some movements inside her mouth.  She reports she also feels like she is slurring on her  speech.  She is not sure whether the Anette Guarneri is causing it or it is due to the dental problem that she had recently.  Discussed with patient that she could stop the Latuda for a week to see if her symptoms get better.  She agrees with plan.  Discussed readjusting her mirtazapine dosage to target her anxiety symptoms.  Patient denies any suicidality, homicidality or perceptual disturbances. Visit Diagnosis:    ICD-10-CM   1. Bipolar I disorder, most recent episode depressed (HCC) F31.30 mirtazapine (REMERON) 15 MG tablet  2. Generalized anxiety disorder F41.1 mirtazapine (REMERON) 15 MG tablet  3. 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 Insomnia due to mental condition F51.05 mirtazapine (REMERON) 15 MG tablet    Past Psychiatric History: Reviewed past psychiatric history from my progress note on 02/05/2018.  Past Medical History:  Past Medical History:  Diagnosis Date  . Anemia 1985   bled during a cervical biopsy, led to bleed transfusion   . Anginal pain ([place])   . Anxiety   . Arthritis    pt. reports that her neck is stiff   . Bipolar depression ([place])   . Blood dyscrasia    reported hx [name] '85; saw hematologist Dr. Janese Banks 10/2016, VWD work-up WNL, not felt to have a primary bleeding disorder   . CAD (coronary artery disease)   . 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 COPD (chronic obstructive pulmonary disease) ([place])    dr. Peterson Lombard  [facility]    PCP  . CRD (chronic renal disease)   . Depression   . Depression with anxiety   . Diabetes mellitus   . Dyspnea    W/ EXERTION   . Enlarged liver   . Family history of adverse reaction to anesthesia    father woke up slowly  . Fibromyalgia   . Generalized anxiety disorder   . GERD (gastroesophageal reflux disease)   . Hyperlipidemia   . Hypertension   . Hypothyroidism   . Low back pain   . Low sodium levels   . Migraines   . Neuropathy   . Neuropathy   .  Persistent disorder of initiating or maintaining sleep   . Restless leg   . Von Willebrand disease ([place])     Past Surgical History:  Procedure Laterality Date  . ABDOMINAL HYSTERECTOMY    . 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 APPLICATION OF ROBOTIC ASSISTANCE FOR SPINAL PROCEDURE N/A 12/10/2016   Procedure: APPLICATION OF ROBOTIC ASSISTANCE FOR SPINAL PROCEDURE;  Surgeon: Kevan Ny Ditty, MD;  Location: [place];  Service: Neurosurgery;  Laterality: N/A;  . BACK SURGERY  2007   lumbar  . BREAST IMPLANT REMOVAL    . CARDIAC CATHETERIZATION     CAD,PCI of LAD, Cypher stent 2.5-28mm  . CARDIAC CATHETERIZATION     PCI of mid-LAD in stent restenosis with a drug-eluting stent  . CARDIAC CATHETERIZATION  06/03/13   [facility];MID LAD 100 % STENOSIS; PRIOR STENT; MID RCA 40 % STENOSIS  . COLONOSCOPY WITH PROPOFOL N/A 08/13/2018   Procedure: COLONOSCOPY WITH PROPOFOL;  Surgeon: [name], MD;  Location: [facility] ENDOSCOPY;  Service: Gastroenterology;  Laterality: N/A;  . CORONARY ARTERY BYPASS GRAFT   08-12-2011   CABG x 3  . CORONARY STENT PLACEMENT    . ESOPHAGOGASTRODUODENOSCOPY (EGD) WITH PROPOFOL N/A 08/13/2018   Procedure: ESOPHAGOGASTRODUODENOSCOPY (EGD) WITH PROPOFOL;  Surgeon: [name], MD;  Location: [facility] ENDOSCOPY;  Service: Gastroenterology;  Laterality: N/A;  . LUMBAR FUSION     L1-S1  . LUMBAR PERCUTANEOUS PEDICLE SCREW 1 LEVEL N/A 12/10/2016   Procedure: Extension of pedicle screw fixation to LUMBAR FOUR;  Surgeon: Kevan Ny Ditty, MD;  Location: [place];  Service: Neurosurgery;   Laterality: N/A;  . NASAL SINUS SURGERY  1997   tuirbinate reduction   . RECTAL EXAM UNDER ANESTHESIA N/A 10/06/2018   Procedure: RECTAL EXAM UNDER ANESTHESIA;  Surgeon: Jules Husbands, MD;  Location: [facility] ORS;  Service: General;  Laterality: N/A;    Family Psychiatric History: I have reviewed family psychiatric history from my progress note on 02/05/2018.  Family History:  Family History  Problem Relation Age of Onset  . Aneurysm Father        abdominal  . Diabetes Father   . Alcohol abuse Father   . Hypertension Father   . Aneurysm Brother        abdominal  . Alcohol abuse Brother   . Hypertension Brother   . Colon cancer Brother   . Diabetes Mother   . Alcohol abuse Mother   . Hypertension Other  family hx  . Hyperlipidemia Other        family hx  . Diabetes Other        family hx    Social History: Reviewed social history from my progress note on 02/05/2018. Social History   Socioeconomic History  . Marital status: Married    Spouse name: Photographer  . Number of children: 3  . Years of education: Not on file  . Highest education level: Associate degree: occupational, Hotel manager, or vocational program  Occupational History  . Not on file  Social Needs  . Financial resource strain: Not on file  . Food insecurity:    Worry: Not on file    Inability: Not on file  . Transportation needs:    Medical: No    Non-medical: No  Tobacco Use  . Smoking status: Former Smoker    Years: 16.00    Types: Cigarettes    Last attempt to quit: 06/20/2010    Years since quitting: 8.6  . Smokeless tobacco: Never Used  Substance and Sexual Activity  . Alcohol use: No    Alcohol/week: 0.0 standard drinks  . Drug use: No  . Sexual activity: Yes  Lifestyle  . Physical activity:    Days per week: 0 days    Minutes per session: 0 min  . Stress: Rather much  Relationships  . Social connections:    Talks on phone: Not on file    Gets together: Not on file    Attends  religious service: More than 4 times per year    Active member of club or organization: No    Attends meetings of clubs or organizations: Never    Relationship status: Married  Other Topics Concern  . Not on file  Social History Narrative  . Not on file    Allergies: No Known Allergies  Metabolic Disorder Labs: Lab Results  Component Value Date   HGBA1C 9.4 (H) 12/05/2017   MPG 223.08 12/05/2017   MPG 197 10/30/2016   Lab Results  Component Value Date   PROLACTIN 6.1 12/05/2017   Lab Results  Component Value Date   CHOL 238 (H) 12/05/2017   TRIG 819 (H) 12/05/2017   HDL 41 12/05/2017   CHOLHDL 5.8 12/05/2017   VLDL UNABLE TO CALCULATE IF TRIGLYCERIDE OVER 400 mg/dL 12/05/2017   LDLCALC UNABLE TO CALCULATE IF TRIGLYCERIDE OVER 400 mg/dL 12/05/2017   LDLCALC SEE COMMENT 07/09/2012   Lab Results  Component Value Date   TSH 2.600 08/03/2018   TSH 86.000 (H) 12/05/2017    Therapeutic Level Labs: No results found for: LITHIUM No results found for: VALPROATE No components found for:  CBMZ  Current Medications: Current Outpatient Medications  Medication Sig Dispense Refill  . albuterol-ipratropium (COMBIVENT) 18-103 MCG/ACT inhaler Inhale 2 puffs into the lungs daily as needed for wheezing or shortness of breath.     Marland Kitchen amLODipine (NORVASC) 5 MG tablet Take 1 tablet (5 mg total) by mouth 2 (two) times daily. 180 tablet 3  . aspirin EC 81 MG tablet Take 1 tablet (81 mg total) by mouth daily.    Marland Kitchen atorvastatin (LIPITOR) 80 MG tablet Take 1 tablet (80 mg total) by mouth daily. 90 tablet 3  . benztropine (COGENTIN) 0.5 MG tablet Take 1 tablet (0.5 mg total) by mouth daily. 90 tablet 1  . carisoprodol (SOMA) 350 MG tablet Take 1 tablet (350 mg total) by mouth every 6 (six) hours as needed for muscle spasms. (Patient taking differently: Take 350  mg by mouth 2 (two) times daily as needed for muscle spasms. ) 120 tablet 0  . carvedilol (COREG) 12.5 MG tablet Take 1 tablet (12.5 mg  total) by mouth 2 (two) times daily with a meal. 180 tablet 3  . dicyclomine (BENTYL) 10 MG capsule Take 1 capsule (10 mg total) by mouth 4 (four) times daily -  before meals and at bedtime. 90 capsule 0  . fluticasone (FLOVENT HFA) 110 MCG/ACT inhaler Inhale into the lungs.    Marland Kitchen FREESTYLE LITE test strip     . HUMULIN 70/30 KWIKPEN (70-30) 100 UNIT/ML PEN Inject 10-15 Units into the skin See admin instructions. Inject 10-15 units SQ in the morning and inject 10-12 units SQ at bedtime per sliding scale    . HYDROcodone-acetaminophen (NORCO/VICODIN) 5-325 MG tablet Take 1 tablet by mouth every 6 (six) hours as needed for moderate pain. 12 tablet 0  . hydrOXYzine (ATARAX/VISTARIL) 25 MG tablet Take 0.5-1 tablets (12.5-25 mg total) by mouth 3 (three) times daily as needed for anxiety. 270 tablet 0  . lisinopril (PRINIVIL,ZESTRIL) 20 MG tablet Take 1 tablet (20 mg total) by mouth daily. 90 tablet 0  . lurasidone (LATUDA) 40 MG TABS tablet Take 1 tablet (40 mg total) by mouth daily with supper. 90 tablet 0  . LYRICA 50 MG capsule Take 50 mg by mouth 2 (two) times daily.     . metFORMIN (GLUCOPHAGE-XR) 750 MG 24 hr tablet Take by mouth.    . mirtazapine (REMERON) 15 MG tablet Take 1.5 tablets (22.5 mg total) by mouth at bedtime. 135 tablet 1  . nitroGLYCERIN (NITROSTAT) 0.4 MG SL tablet Place 1 tablet (0.4 mg total) under the tongue every 5 (five) minutes as needed for chest pain. 25 tablet 6  . omeprazole (PRILOSEC) 20 MG capsule Take 20 mg by mouth daily.     . Oxcarbazepine (TRILEPTAL) 300 MG tablet Take 2 tablets (600 mg total) by mouth at bedtime. 180 tablet 1  . oxyCODONE (OXY IR/ROXICODONE) 5 MG immediate release tablet Take 5 mg by mouth See admin instructions. Take 5 mg by mouth up to 5 times daily as needed for severe pain    . oxyCODONE-acetaminophen (PERCOCET) 10-325 MG tablet Take by mouth.    . RESTASIS 0.05 % ophthalmic emulsion Place 1 drop into both eyes daily as needed (for dryness).      Marland Kitchen rOPINIRole (REQUIP) 0.5 MG tablet Take 1 mg by mouth at bedtime.     Marland Kitchen SYNTHROID 25 MCG tablet Take 25 mcg by mouth daily before breakfast.     . TRULICITY 1.5 QV/9.5GL SOPN     . zolpidem (AMBIEN) 10 MG tablet TAKE 1/2 TO 1 (ONE-HALF TO ONE) TABLET BY MOUTH AT BEDTIME AS NEEDED FOR SLEEP 90 tablet 0  . sitaGLIPtin (JANUVIA) 100 MG tablet Take 100 mg by mouth daily.     No current facility-administered medications for this visit.      Musculoskeletal: Strength & Muscle Tone: UTA Gait & Station: UTA Patient leans: N/A  Psychiatric Specialty Exam: Review of Systems  Psychiatric/Behavioral: The patient is nervous/anxious.   All other systems reviewed and are negative.   There were no vitals taken for this visit.There is no height or weight on file to calculate BMI.  General Appearance: UTA  Eye Contact:  UTA  Speech:  Normal Rate  Volume:  Decreased  Mood:  Anxious  Affect:  UTA  Thought Process:  Goal Directed and Descriptions of Associations: Intact  Orientation:  Full (Time, Place, and Person)  Thought Content: Logical   Suicidal Thoughts:  No  Homicidal Thoughts:  No  Memory:  Immediate;   Fair Recent;   Fair Remote;   Fair  Judgement:  Fair  Insight:  Fair  Psychomotor Activity:  UTA  Concentration:  Concentration: Fair and Attention Span: Fair  Recall:  [employer] of Knowledge: Fair  Language: Fair  Akathisia:  No  Handed:  Right  AIMS (if indicated): Does reports some movements inside her mouth, slurring on words   Assets:  Communication Skills Desire for Improvement Social Support  ADL's:  Intact  Cognition: WNL  Sleep:  Fair   Screenings: AIMS     Office Visit from 12/23/2018 in [place] Visit from 08/18/2018 in [place] Total Score  5  6    PHQ2-9     Office Visit from 05/09/2016 in [place] Procedure visit from 03/20/2016 in  [place] Office Visit from 03/12/2016 in [place] from 02/13/2016 in [place] Office Visit from 12/14/2015 in [place]  PHQ-2 Total Score  0  0  0  0  0       Assessment and Plan: Sihaam is a 63 year old Caucasian female who has a history of bipolar disorder, anxiety disorder was evaluated by telephone consult today.  Patient continues to struggle with some anxiety symptoms.  She also reports possible side effects to the [place].  Discussed the following medication changes.  Plan Bipolar disorder-some progress Trileptal 600 milligrams p.o. daily. Latuda as prescribed.  For neuroleptic induced acute dystonia-patient continues to have some slurring with words unknown if this is due to her dental problems or not. Continue Cogentin 0.5 mg p.o. daily Discussed with patient to stop the Latuda for a week to see if symptoms improve.  For anxiety disorder-unstable Increase mirtazapine to 22.5 mg p.o. nightly Hydroxyzine 3 times a day as needed-12.5 to 25 mg.  For insomnia-improving Ambien as prescribed.  Follow-up in clinic in 4 weeks or sooner if needed.  Patient advised to follow-up with Ms. Peacock for continued psychotherapy sessions.  I have spent atleast 15 minutes non face to face with patient today. More than 50 % of the time was spent for psychoeducation and supportive psychotherapy and care coordination.  This note was generated in part or whole with voice recognition software. Voice recognition is usually quite accurate but there are transcription errors that can and very often do occur. I apologize for any typographical errors that were not detected and corrected.        Ursula Alert, MD 02/03/2019, 5:06 PM

## 2019-02-03 NOTE — Progress Notes (Signed)
TC 02-03-19 @ 10:17 reviewed pt allergies with no changes. Pt medical and surgical hx was review with no changes. Medications and pharmacy was reviewed and up dated. No vitals done at this time because it is a phone consult.

## 2019-02-10 ENCOUNTER — Other Ambulatory Visit: Payer: Self-pay

## 2019-02-10 ENCOUNTER — Ambulatory Visit (INDEPENDENT_AMBULATORY_CARE_PROVIDER_SITE_OTHER): Payer: PRIVATE HEALTH INSURANCE | Admitting: Licensed Clinical Social Worker

## 2019-02-10 DIAGNOSIS — F3130 Bipolar disorder, current episode depressed, mild or moderate severity, unspecified: Secondary | ICD-10-CM

## 2019-03-02 ENCOUNTER — Telehealth: Payer: Self-pay

## 2019-03-02 DIAGNOSIS — F5105 Insomnia due to other mental disorder: Secondary | ICD-10-CM

## 2019-03-02 DIAGNOSIS — F411 Generalized anxiety disorder: Secondary | ICD-10-CM

## 2019-03-02 DIAGNOSIS — F3130 Bipolar disorder, current episode depressed, mild or moderate severity, unspecified: Secondary | ICD-10-CM

## 2019-03-02 NOTE — Telephone Encounter (Signed)
pt called states she needs her medication sent to mail order pharmacy so she doesnt run out before her appointment.

## 2019-03-03 MED ORDER — LURASIDONE HCL 40 MG PO TABS: 40.0000 mg | ORAL_TABLET | Freq: Every day | ORAL | 1 refills | Status: DC

## 2019-03-03 MED ORDER — HYDROXYZINE HCL 25 MG PO TABS: 12.5000 mg | ORAL_TABLET | Freq: Three times a day (TID) | ORAL | 1 refills | Status: DC | PRN

## 2019-03-03 MED ORDER — ZOLPIDEM TARTRATE 10 MG PO TABS: ORAL_TABLET | ORAL | 0 refills | Status: DC

## 2019-03-03 NOTE — Telephone Encounter (Signed)
Sent meds to pharmacy for 90 days.

## 2019-03-03 NOTE — Telephone Encounter (Signed)
pt want to put in for her refill since it is a mail order pharmacy and it usually takes a week to get medications.

## 2019-03-04 DIAGNOSIS — J4489 Other specified chronic obstructive pulmonary disease: Secondary | ICD-10-CM | POA: Insufficient documentation

## 2019-03-04 DIAGNOSIS — J449 Chronic obstructive pulmonary disease, unspecified: Secondary | ICD-10-CM | POA: Insufficient documentation

## 2019-03-05 ENCOUNTER — Ambulatory Visit (HOSPITAL_BASED_OUTPATIENT_CLINIC_OR_DEPARTMENT_OTHER): Admitting: Psychiatry

## 2019-03-05 ENCOUNTER — Encounter: Payer: Self-pay | Admitting: Psychiatry

## 2019-03-05 ENCOUNTER — Other Ambulatory Visit: Payer: Self-pay

## 2019-03-05 DIAGNOSIS — F411 Generalized anxiety disorder: Secondary | ICD-10-CM

## 2019-03-05 DIAGNOSIS — F5105 Insomnia due to other mental disorder: Secondary | ICD-10-CM

## 2019-03-05 DIAGNOSIS — T43505A Adverse effect of unspecified antipsychotics and neuroleptics, initial encounter: Secondary | ICD-10-CM

## 2019-03-05 DIAGNOSIS — G2402 Drug induced acute dystonia: Secondary | ICD-10-CM | POA: Diagnosis not present

## 2019-03-05 DIAGNOSIS — F3130 Bipolar disorder, current episode depressed, mild or moderate severity, unspecified: Secondary | ICD-10-CM

## 2019-03-05 MED ORDER — OXCARBAZEPINE 300 MG PO TABS: 600.0000 mg | ORAL_TABLET | Freq: Every day | ORAL | 1 refills | Status: DC

## 2019-03-05 NOTE — Progress Notes (Signed)
Virtual Visit via Telephone Note  I connected with Jennifer [name] on 03/05/19 at 11:00 AM EDT by telephone and verified that I am speaking with the correct person using two identifiers.   I discussed the limitations, risks, security and privacy concerns of performing an evaluation and management service by telephone and the availability of in person appointments. I also discussed with the patient that there may be a patient responsible charge related to this service. The patient expressed understanding and agreed to proceed.    I discussed the assessment and treatment plan with the patient. The patient was provided an opportunity to ask questions and all were answered. The patient agreed with the plan and demonstrated an understanding of the instructions.   The patient was advised to call back or seek an in-person evaluation if the symptoms worsen or if the condition fails to improve as anticipated.   [place] MD  OP Progress Note  03/05/2019 1:20 PM Jennifer [name]  MRN:  [id number]  Chief Complaint:  Chief Complaint    Follow-up     HPI: Jennifer [name] is a 63 year old Caucasian female, lives in [place], married, has a history of bipolar disorder, GAD, insomnia, neuroleptic induced dystonia currently resolved, was evaluated by phone today.  Patient today declined video consult.  Patient reports she is anxious about the COVID-19 outbreak however has been coping okay.  Her family has been supporting each other.  She reports she is currently off of the [place] and does not have the slurred speech as much as she had before.  She wants to stay off of the [place].  Discussed with patient to stop the Cogentin since she is not on [place] and she agrees with plan.  She continues to be compliant with Trileptal and mirtazapine.  She reports her mood as fair.  She reports sleep is okay.  She continues to take the Ambien.  Patient denies any suicidality, homicidality or perceptual disturbances.  Patient  denies any other concerns today. Visit Diagnosis:    ICD-10-CM   1. Bipolar I disorder, most recent episode depressed (HCC) F31.30 Oxcarbazepine (TRILEPTAL) 300 MG tablet  2. Generalized anxiety disorder F41.1   3. 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 Insomnia due to mental condition F51.05     Past Psychiatric History: I have reviewed past psychiatric history from my progress note on 02/05/2018  Past Medical History:  Past Medical History:  Diagnosis Date  . Anemia 1985   bled during a cervical biopsy, led to bleed transfusion   . Anginal pain ([place])   . Anxiety   . Arthritis    pt. reports that her neck is stiff   . Bipolar depression ([place])   . Blood dyscrasia    reported hx [name] '85; saw hematologist Dr. Janese Banks 10/2016, VWD work-up WNL, not felt to have a primary bleeding disorder   . CAD (coronary artery disease)   . 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 COPD (chronic obstructive pulmonary disease) ([place])    dr. Gar P[facility]    PCP  . CRD (chronic renal disease)   . Depression   . Depression with anxiety   . Diabetes mellitus   . Dyspnea    W/ EXERTION   . Enlarged liver   . Family history of adverse reaction to anesthesia    father woke up slowly  . Fibromyalgia   .  Generalized anxiety disorder   . GERD (gastroesophageal reflux disease)   . Hyperlipidemia   . Hypertension   . Hypothyroidism   . Low back pain   . Low sodium levels   . Migraines   . Neuropathy   . Neuropathy   . Persistent disorder of initiating or maintaining sleep   . Restless leg   . Von Willebrand disease ([place])     Past Surgical History:  Procedure Laterality Date  . ABDOMINAL HYSTERECTOMY    . 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 APPLICATION OF ROBOTIC ASSISTANCE FOR SPINAL PROCEDURE N/A 12/10/2016   Procedure: APPLICATION OF ROBOTIC ASSISTANCE FOR SPINAL PROCEDURE;  Surgeon: Kevan Ny Ditty, MD;  Location: [facility];  Service: Neurosurgery;  Laterality: N/A;  . BACK SURGERY  2007   lumbar  . BREAST IMPLANT REMOVAL    . CARDIAC CATHETERIZATION     CAD,PCI of LAD, Cypher stent 2.5-28mm  . CARDIAC CATHETERIZATION     PCI of mid-LAD in stent restenosis with a drug-eluting stent  . CARDIAC CATHETERIZATION  06/03/13   [facility];MID LAD 100 % STENOSIS; PRIOR STENT; MID RCA 40 % STENOSIS  . COLONOSCOPY WITH PROPOFOL N/A 08/13/2018   Procedure: COLONOSCOPY WITH PROPOFOL;  Surgeon: [name], MD;  Location: [facility] ENDOSCOPY;  Service: Gastroenterology;  Laterality: N/A;  . CORONARY ARTERY BYPASS GRAFT   08-12-2011   CABG x 3  . CORONARY STENT PLACEMENT    . ESOPHAGOGASTRODUODENOSCOPY (EGD) WITH PROPOFOL N/A 08/13/2018   Procedure: ESOPHAGOGASTRODUODENOSCOPY (EGD) WITH PROPOFOL;  Surgeon: [name], MD;  Location: [facility] ENDOSCOPY;  Service: Gastroenterology;  Laterality: N/A;  . LUMBAR FUSION     L1-S1  . LUMBAR PERCUTANEOUS PEDICLE SCREW 1 LEVEL N/A 12/10/2016   Procedure: Extension of pedicle screw fixation to LUMBAR FOUR;  Surgeon: Kevan Ny Ditty, MD;  Location: [place];  Service: Neurosurgery;  Laterality: N/A;  . NASAL SINUS SURGERY  1997   tuirbinate reduction   . RECTAL EXAM UNDER ANESTHESIA N/A 10/06/2018   Procedure: RECTAL EXAM UNDER ANESTHESIA;  Surgeon: Jules Husbands, MD;  Location: [facility] ORS;  Service: General;  Laterality: N/A;    Family Psychiatric History: Reviewed family psychiatric history from my progress note on 02/05/2018.  Family History:  Family History  Problem Relation Age  of Onset  . Aneurysm Father        abdominal  . Diabetes Father   . Alcohol abuse Father   . Hypertension Father   . Aneurysm Brother        abdominal  . Alcohol abuse Brother   . Hypertension Brother   . Colon cancer Brother   . Diabetes Mother   . Alcohol abuse Mother   . Hypertension Other        family hx  . Hyperlipidemia Other        family hx  . Diabetes Other        family hx    Social History: Reviewed social history from my progress note on 02/05/2018. Social History   Socioeconomic History  . Marital status: Married    Spouse name: Photographer  . Number of children: 3  .  Years of education: Not on file  . Highest education level: Associate degree: occupational, Hotel manager, or vocational program  Occupational History  . Not on file  Social Needs  . Financial resource strain: Not on file  . Food insecurity:    Worry: Not on file    Inability: Not on file  . Transportation needs:    Medical: No    Non-medical: No  Tobacco Use  . Smoking status: Former Smoker    Years: 16.00    Types: Cigarettes    Last attempt to quit: 06/20/2010    Years since quitting: 8.7  . Smokeless tobacco: Never Used  Substance and Sexual Activity  . Alcohol use: No    Alcohol/week: 0.0 standard drinks  . Drug use: No  . Sexual activity: Yes  Lifestyle  . Physical activity:    Days per week: 0 days    Minutes per session: 0 min  . Stress: Rather much  Relationships  . Social connections:    Talks on phone: Not on file    Gets together: Not on file    Attends religious service: More than 4 times per year    Active member of club or organization: No    Attends meetings of clubs or organizations: Never    Relationship status: Married  Other Topics Concern  . Not on file  Social History Narrative  . Not on file    Allergies: No Known Allergies  Metabolic Disorder Labs: Lab Results  Component Value Date   HGBA1C 9.4 (H) 12/05/2017   MPG 223.08 12/05/2017   MPG 197  10/30/2016   Lab Results  Component Value Date   PROLACTIN 6.1 12/05/2017   Lab Results  Component Value Date   CHOL 238 (H) 12/05/2017   TRIG 819 (H) 12/05/2017   HDL 41 12/05/2017   CHOLHDL 5.8 12/05/2017   VLDL UNABLE TO CALCULATE IF TRIGLYCERIDE OVER 400 mg/dL 12/05/2017   LDLCALC UNABLE TO CALCULATE IF TRIGLYCERIDE OVER 400 mg/dL 12/05/2017   LDLCALC SEE COMMENT 07/09/2012   Lab Results  Component Value Date   TSH 2.600 08/03/2018   TSH 86.000 (H) 12/05/2017    Therapeutic Level Labs: No results found for: LITHIUM No results found for: VALPROATE No components found for:  CBMZ  Current Medications: Current Outpatient Medications  Medication Sig Dispense Refill  . estradiol (ESTRING) 2 MG vaginal ring Place vaginally.    . tolterodine (DETROL LA) 4 MG 24 hr capsule Take by mouth.    Marland Kitchen albuterol-ipratropium (COMBIVENT) 18-103 MCG/ACT inhaler Inhale 2 puffs into the lungs daily as needed for wheezing or shortness of breath.     Marland Kitchen amLODipine (NORVASC) 5 MG tablet Take 1 tablet (5 mg total) by mouth 2 (two) times daily. 180 tablet 3  . aspirin EC 81 MG tablet Take 1 tablet (81 mg total) by mouth daily.    Marland Kitchen atorvastatin (LIPITOR) 80 MG tablet Take 1 tablet (80 mg total) by mouth daily. 90 tablet 3  . carisoprodol (SOMA) 350 MG tablet Take 1 tablet (350 mg total) by mouth every 6 (six) hours as needed for muscle spasms. (Patient taking differently: Take 350 mg by mouth 2 (two) times daily as needed for muscle spasms. ) 120 tablet 0  . carvedilol (COREG) 12.5 MG tablet Take 1 tablet (12.5 mg total) by mouth 2 (two) times daily with a meal. 180 tablet 3  . dicyclomine (BENTYL) 10 MG capsule Take 1 capsule (10 mg total) by mouth 4 (four) times  daily -  before meals and at bedtime. 90 capsule 0  . fluticasone (FLOVENT HFA) 110 MCG/ACT inhaler Inhale into the lungs.    Marland Kitchen FREESTYLE LITE test strip     . HUMULIN 70/30 KWIKPEN (70-30) 100 UNIT/ML PEN Inject 10-15 Units into the skin  See admin instructions. Inject 10-15 units SQ in the morning and inject 10-12 units SQ at bedtime per sliding scale    . HYDROcodone-acetaminophen (NORCO/VICODIN) 5-325 MG tablet Take 1 tablet by mouth every 6 (six) hours as needed for moderate pain. 12 tablet 0  . hydrOXYzine (ATARAX/VISTARIL) 25 MG tablet Take 0.5-1 tablets (12.5-25 mg total) by mouth 3 (three) times daily as needed for anxiety. 270 tablet 1  . lisinopril (PRINIVIL,ZESTRIL) 20 MG tablet Take 1 tablet (20 mg total) by mouth daily. 90 tablet 0  . LYRICA 50 MG capsule Take 50 mg by mouth 2 (two) times daily.     . metFORMIN (GLUCOPHAGE-XR) 750 MG 24 hr tablet Take by mouth.    . mirtazapine (REMERON) 15 MG tablet Take 1.5 tablets (22.5 mg total) by mouth at bedtime. 135 tablet 1  . nitroGLYCERIN (NITROSTAT) 0.4 MG SL tablet Place 1 tablet (0.4 mg total) under the tongue every 5 (five) minutes as needed for chest pain. 25 tablet 6  . omeprazole (PRILOSEC) 20 MG capsule Take 20 mg by mouth daily.     . Oxcarbazepine (TRILEPTAL) 300 MG tablet Take 2 tablets (600 mg total) by mouth at bedtime. 180 tablet 1  . oxyCODONE (OXY IR/ROXICODONE) 5 MG immediate release tablet Take 5 mg by mouth See admin instructions. Take 5 mg by mouth up to 5 times daily as needed for severe pain    . oxyCODONE-acetaminophen (PERCOCET) 10-325 MG tablet Take by mouth.    . RESTASIS 0.05 % ophthalmic emulsion Place 1 drop into both eyes daily as needed (for dryness).     Marland Kitchen rOPINIRole (REQUIP) 0.5 MG tablet Take 1 mg by mouth at bedtime.     . sitaGLIPtin (JANUVIA) 100 MG tablet Take 100 mg by mouth daily.    Marland Kitchen SYNTHROID 25 MCG tablet Take 25 mcg by mouth daily before breakfast.     . TRULICITY 1.5 UD/1.4HF SOPN     . zolpidem (AMBIEN) 10 MG tablet TAKE 1/2 TO 1 (ONE-HALF TO ONE) TABLET BY MOUTH AT BEDTIME AS NEEDED FOR SLEEP 90 tablet 0   No current facility-administered medications for this visit.      Musculoskeletal: Strength & Muscle Tone: UTA Gait &  Station: UTA Patient leans: N/A  Psychiatric Specialty Exam: Review of Systems  Psychiatric/Behavioral: The patient is nervous/anxious.   All other systems reviewed and are negative.   There were no vitals taken for this visit.There is no height or weight on file to calculate BMI.  General Appearance: UTA  Eye Contact:  UTA  Speech:  Clear and Coherent  Volume:  Normal  Mood:  Anxious coping  Affect:  UTA  Thought Process:  Goal Directed and Descriptions of Associations: Intact  Orientation:  Full (Time, Place, and Person)  Thought Content: Logical   Suicidal Thoughts:  No  Homicidal Thoughts:  No  Memory:  Immediate;   Fair Recent;   Fair Remote;   Fair  Judgement:  Fair  Insight:  Fair  Psychomotor Activity:  UTA  Concentration:  Concentration: Fair and Attention Span: Fair  Recall:  [employer] of Knowledge: Fair  Language: Fair  Akathisia:  No  Handed:  Right  AIMS (if indicated): Denies tremors, rigidity, stiffness  Assets:  Communication Skills Desire for Improvement Housing  ADL's:  Intact  Cognition: WNL  Sleep:  Fair   Screenings: AIMS     Office Visit from 12/23/2018 in [facility] Office Visit from 08/18/2018 in [place] Total Score  5  6    PHQ2-9     Office Visit from 05/09/2016 in [place] Procedure visit from 03/20/2016 in [place] Office Visit from 03/12/2016 in [place] Clinical Support from 02/13/2016 in [place] Office Visit from 12/14/2015 in [place]  PHQ-2 Total Score  0  0  0  0  0       Assessment and Plan: Keyaira is a 63 year old Caucasian female who has a history of bipolar disorder, anxiety disorder was evaluated by phone consult today.  Patient was  offered video however declined.  Patient continues to do well on the current medication regimen.  Plan as noted below.  Plan Bipolar disorder-improving Trileptal 600 mg p.o. daily. Discontinue Latuda for side effects.  For neuroleptic induced acute dystonia-patient with some slurring however improved. Discontinue Cogentin since she reports improvement.  She is no longer on Latuda  For anxiety disorder-improving Mirtazapine 22.5 mg p.o. nightly Hydroxyzine 3 times a day as needed- 12.5 -  25 mg.  For insomnia-improving Ambien as prescribed.  Follow-up in clinic in 2 months or sooner if needed.  I have spent atleast 15 minutes non face to face with patient today. More than 50 % of the time was spent for psychoeducation and supportive psychotherapy and care coordination.  This note was generated in part or whole with voice recognition software. Voice recognition is usually quite accurate but there are transcription errors that can and very often do occur. I apologize for any typographical errors that were not detected and corrected.          Ursula Alert, MD 03/05/2019, 1:20 PM

## 2019-03-14 NOTE — Progress Notes (Signed)
No contact.

## 2019-03-15 ENCOUNTER — Telehealth: Payer: Self-pay

## 2019-03-15 NOTE — Telephone Encounter (Signed)
pt called states she stil has not received her zolpidem. can you send in 7 to 10 day supply until she receiveds medication in the mail order?

## 2019-03-15 NOTE — Telephone Encounter (Signed)
Spoke to patient- discussed Ambien was filled per [facility] - on 03/09/2019 - for 90 days. Patient advised to take vistaril as needed or seroquel as needed 25 mg for sleep until she gets her supplies back.

## 2019-05-05 ENCOUNTER — Ambulatory Visit (INDEPENDENT_AMBULATORY_CARE_PROVIDER_SITE_OTHER): Admitting: Psychiatry

## 2019-05-05 ENCOUNTER — Other Ambulatory Visit: Payer: Self-pay

## 2019-05-05 ENCOUNTER — Encounter: Payer: Self-pay | Admitting: Psychiatry

## 2019-05-05 DIAGNOSIS — F411 Generalized anxiety disorder: Secondary | ICD-10-CM

## 2019-05-05 DIAGNOSIS — F5105 Insomnia due to other mental disorder: Secondary | ICD-10-CM | POA: Diagnosis not present

## 2019-05-05 DIAGNOSIS — F3130 Bipolar disorder, current episode depressed, mild or moderate severity, unspecified: Secondary | ICD-10-CM | POA: Diagnosis not present

## 2019-05-05 DIAGNOSIS — T43505A Adverse effect of unspecified antipsychotics and neuroleptics, initial encounter: Secondary | ICD-10-CM

## 2019-05-05 DIAGNOSIS — G2402 Drug induced acute dystonia: Secondary | ICD-10-CM | POA: Diagnosis not present

## 2019-05-05 NOTE — Progress Notes (Signed)
Virtual Visit via Telephone Note  I connected with Jennifer [name] on 05/05/19 at  2:00 PM EDT by telephone and verified that I am speaking with the correct person using two identifiers.   I discussed the limitations, risks, security and privacy concerns of performing an evaluation and management service by telephone and the availability of in person appointments. I also discussed with the patient that there may be a patient responsible charge related to this service. The patient expressed understanding and agreed to proceed.    I discussed the assessment and treatment plan with the patient. The patient was provided an opportunity to ask questions and all were answered. The patient agreed with the plan and demonstrated an understanding of the instructions.   The patient was advised to call back or seek an in-person evaluation if the symptoms worsen or if the condition fails to improve as anticipated.  [facility] MD OP Progress Note  05/05/2019 5:32 PM Jennifer [name]  MRN:  [id number]  Chief Complaint:  Chief Complaint    Follow-up     HPI: Jennifer [name] is a 63 year old Caucasian female, lives in [place] married, has a history of bipolar disorder, GAD, insomnia, neuroleptic induced dystonia currently resolved was evaluated by phone today.  Patient today declined video consult.  Patient reports she is currently doing well on the current medication regimen.  She reports she just returned from her vacation.  She and her family are currently staying safe from [place] reports doing well.  She reports her mood symptoms are stable on the current medication regimen.  She continues to sleep well on the Ambien.  Patient denies any suicidality, homicidality or perceptual disturbances.  Patient does report that she does have some relationship struggles with her husband now that he is staying home more.  She however reports she will continue to work on it is able to cope with it.  Patient denies any other  concerns today.  Visit Diagnosis:    ICD-10-CM   1. Bipolar I disorder, most recent episode depressed ([place])  F31.30   2. Generalized anxiety disorder  F41.1   3. 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 Insomnia due to mental condition  F51.05     Past Psychiatric History: I have reviewed past psychiatric history from my progress note on 02/05/2018.  Past Medical History:  Past Medical History:  Diagnosis Date  . Anemia 1985   bled during a cervical biopsy, led to bleed transfusion   . Anginal pain ([place])   . Anxiety   . Arthritis    pt. reports that her neck is stiff   . Bipolar depression ([place])   . Blood dyscrasia    reported hx [name] '85; saw hematologist Dr. Janese Banks 10/2016, VWD work-up WNL, not felt to have a primary bleeding disorder   . CAD (coronary artery disease)   . 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 COPD (chronic obstructive pulmonary disease) ([place])    [facility]. Gar [facility]    PCP  . CRD (chronic renal disease)   . Depression   . Depression with anxiety   . Diabetes mellitus   . Dyspnea    W/ EXERTION   . Enlarged liver   . Family history of adverse reaction to anesthesia    father woke up slowly  . Fibromyalgia   . Generalized anxiety disorder   .  GERD (gastroesophageal reflux disease)   . Hyperlipidemia   . Hypertension   . Hypothyroidism   . Low back pain   . Low sodium levels   . Migraines   . Neuropathy   . Neuropathy   . Persistent disorder of initiating or maintaining sleep   . Restless leg   . Von Willebrand disease ([place])     Past Surgical History:  Procedure Laterality Date  . ABDOMINAL HYSTERECTOMY    . 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 APPLICATION OF ROBOTIC ASSISTANCE FOR SPINAL PROCEDURE N/A 12/10/2016   Procedure: APPLICATION OF ROBOTIC ASSISTANCE FOR SPINAL PROCEDURE;  Surgeon: Kevan Ny Ditty, MD;  Location: [facility];  Service: Neurosurgery;  Laterality: N/A;  . BACK SURGERY  2007   lumbar  . BREAST IMPLANT REMOVAL    . CARDIAC CATHETERIZATION     CAD,PCI of LAD, Cypher stent 2.5-28mm  . CARDIAC CATHETERIZATION     PCI of mid-LAD in stent restenosis with a drug-eluting stent  . CARDIAC CATHETERIZATION  06/03/13   [facility];MID LAD 100 % STENOSIS; PRIOR STENT; MID RCA 40 % STENOSIS  . COLONOSCOPY WITH PROPOFOL N/A 08/13/2018   Procedure: COLONOSCOPY WITH PROPOFOL;  Surgeon: [name], MD;  Location: [facility] ENDOSCOPY;  Service: Gastroenterology;  Laterality: N/A;  . CORONARY ARTERY BYPASS GRAFT   08-12-2011   CABG x 3  . CORONARY STENT PLACEMENT    . ESOPHAGOGASTRODUODENOSCOPY (EGD) WITH PROPOFOL N/A 08/13/2018   Procedure: ESOPHAGOGASTRODUODENOSCOPY (EGD) WITH PROPOFOL;  Surgeon: [name], MD;  Location: [facility] ENDOSCOPY;  Service: Gastroenterology;  Laterality: N/A;  . LUMBAR FUSION     L1-S1  . LUMBAR PERCUTANEOUS PEDICLE SCREW 1 LEVEL N/A 12/10/2016   Procedure: Extension of pedicle screw fixation to LUMBAR FOUR;  Surgeon: Kevan Ny Ditty, MD;  Location: [facility];  Service: Neurosurgery;  Laterality: N/A;  . NASAL SINUS SURGERY  1997   tuirbinate reduction   . RECTAL EXAM UNDER ANESTHESIA N/A 10/06/2018   Procedure: RECTAL EXAM UNDER ANESTHESIA;  Surgeon: Jules Husbands, MD;  Location: [facility] ORS;  Service: General;  Laterality: N/A;    Family Psychiatric History: I have reviewed family psychiatric history from my progress note on 02/05/2018.  Family History:  Family History  Problem Relation Age of Onset  . Aneurysm Father        abdominal   . Diabetes Father   . Alcohol abuse Father   . Hypertension Father   . Aneurysm Brother        abdominal  . Alcohol abuse Brother   . Hypertension Brother   . Colon cancer Brother   . Diabetes Mother   . Alcohol abuse Mother   . Hypertension Other        family hx  . Hyperlipidemia Other        family hx  . Diabetes Other        family hx    Social History: I have reviewed social history from my progress note on 02/05/2018. Social History   Socioeconomic History  . Marital status: Married    Spouse name: Photographer  . Number of children: 3  . Years  of education: Not on file  . Highest education level: Associate degree: occupational, Hotel manager, or vocational program  Occupational History  . Not on file  Social Needs  . Financial resource strain: Not on file  . Food insecurity    Worry: Not on file    Inability: Not on file  . Transportation needs    Medical: No    Non-medical: No  Tobacco Use  . Smoking status: Former Smoker    Years: 16.00    Types: Cigarettes    Quit date: 06/20/2010    Years since quitting: 8.8  . Smokeless tobacco: Never Used  Substance and Sexual Activity  . Alcohol use: No    Alcohol/week: 0.0 standard drinks  . Drug use: No  . Sexual activity: Yes  Lifestyle  . Physical activity    Days per week: 0 days    Minutes per session: 0 min  . Stress: Rather much  Relationships  . Social Herbalist on phone: Not on file    Gets together: Not on file    Attends religious service: More than 4 times per year    Active member of club or organization: No    Attends meetings of clubs or organizations: Never    Relationship status: Married  Other Topics Concern  . Not on file  Social History Narrative  . Not on file    Allergies: No Known Allergies  Metabolic Disorder Labs: Lab Results  Component Value Date   HGBA1C 9.4 (H) 12/05/2017   MPG 223.08 12/05/2017   MPG 197 10/30/2016   Lab Results  Component Value Date    PROLACTIN 6.1 12/05/2017   Lab Results  Component Value Date   CHOL 238 (H) 12/05/2017   TRIG 819 (H) 12/05/2017   HDL 41 12/05/2017   CHOLHDL 5.8 12/05/2017   VLDL UNABLE TO CALCULATE IF TRIGLYCERIDE OVER 400 mg/dL 12/05/2017   LDLCALC UNABLE TO CALCULATE IF TRIGLYCERIDE OVER 400 mg/dL 12/05/2017   LDLCALC SEE COMMENT 07/09/2012   Lab Results  Component Value Date   TSH 2.600 08/03/2018   TSH 86.000 (H) 12/05/2017    Therapeutic Level Labs: No results found for: LITHIUM No results found for: VALPROATE No components found for:  CBMZ  Current Medications: Current Outpatient Medications  Medication Sig Dispense Refill  . albuterol-ipratropium (COMBIVENT) 18-103 MCG/ACT inhaler Inhale 2 puffs into the lungs daily as needed for wheezing or shortness of breath.     Marland Kitchen amLODipine (NORVASC) 5 MG tablet Take 1 tablet (5 mg total) by mouth 2 (two) times daily. 180 tablet 3  . aspirin EC 81 MG tablet Take 1 tablet (81 mg total) by mouth daily.    Marland Kitchen atorvastatin (LIPITOR) 80 MG tablet Take 1 tablet (80 mg total) by mouth daily. 90 tablet 3  . carisoprodol (SOMA) 350 MG tablet Take 1 tablet (350 mg total) by mouth every 6 (six) hours as needed for muscle spasms. (Patient taking differently: Take 350 mg by mouth 2 (two) times daily as needed for muscle spasms. ) 120 tablet 0  . carvedilol (COREG) 12.5 MG tablet Take 1 tablet (12.5 mg total) by mouth 2 (two) times daily with a meal. 180 tablet 3  . dicyclomine (BENTYL) 10 MG capsule Take 1 capsule (10 mg total) by mouth 4 (four) times daily -  before meals and at bedtime. 90 capsule 0  . estradiol (ESTRING) 2 MG vaginal ring Place vaginally.    . fluticasone (FLOVENT HFA) 110 MCG/ACT  inhaler Inhale into the lungs.    Marland Kitchen FREESTYLE LITE test strip     . HUMULIN 70/30 KWIKPEN (70-30) 100 UNIT/ML PEN Inject 10-15 Units into the skin See admin instructions. Inject 10-15 units SQ in the morning and inject 10-12 units SQ at bedtime per sliding scale     . HYDROcodone-acetaminophen (NORCO/VICODIN) 5-325 MG tablet Take 1 tablet by mouth every 6 (six) hours as needed for moderate pain. 12 tablet 0  . hydrOXYzine (ATARAX/VISTARIL) 25 MG tablet Take 0.5-1 tablets (12.5-25 mg total) by mouth 3 (three) times daily as needed for anxiety. 270 tablet 1  . lisinopril (PRINIVIL,ZESTRIL) 20 MG tablet Take 1 tablet (20 mg total) by mouth daily. 90 tablet 0  . LYRICA 50 MG capsule Take 50 mg by mouth 2 (two) times daily.     . metFORMIN (GLUCOPHAGE-XR) 750 MG 24 hr tablet Take by mouth.    . mirtazapine (REMERON) 15 MG tablet Take 1.5 tablets (22.5 mg total) by mouth at bedtime. 135 tablet 1  . nitroGLYCERIN (NITROSTAT) 0.4 MG SL tablet Place 1 tablet (0.4 mg total) under the tongue every 5 (five) minutes as needed for chest pain. 25 tablet 6  . omeprazole (PRILOSEC) 20 MG capsule Take 20 mg by mouth daily.     . Oxcarbazepine (TRILEPTAL) 300 MG tablet Take 2 tablets (600 mg total) by mouth at bedtime. 180 tablet 1  . oxyCODONE (OXY IR/ROXICODONE) 5 MG immediate release tablet Take 5 mg by mouth See admin instructions. Take 5 mg by mouth up to 5 times daily as needed for severe pain    . Oxycodone HCl 10 MG TABS LIMIT ONE HALF TO ONE TABLET BY MOUTH FOUR TO SIX TIMES PER DAY IF TOLERATED (NO HYDROCODONE ACETAMINOPHEN)    . oxyCODONE-acetaminophen (PERCOCET) 10-325 MG tablet Take by mouth.    . RESTASIS 0.05 % ophthalmic emulsion Place 1 drop into both eyes daily as needed (for dryness).     Marland Kitchen rOPINIRole (REQUIP) 0.5 MG tablet Take 1 mg by mouth at bedtime.     . sitaGLIPtin (JANUVIA) 100 MG tablet Take 100 mg by mouth daily.    Marland Kitchen SYNTHROID 25 MCG tablet Take 25 mcg by mouth daily before breakfast.     . tolterodine (DETROL LA) 4 MG 24 hr capsule Take by mouth.    . TRULICITY 1.5 ZJ/6.7HA SOPN     . zolpidem (AMBIEN) 10 MG tablet TAKE 1/2 TO 1 (ONE-HALF TO ONE) TABLET BY MOUTH AT BEDTIME AS NEEDED FOR SLEEP 90 tablet 0   No current facility-administered  medications for this visit.      Musculoskeletal: Strength & Muscle Tone: UTA Gait & Station: UTA Patient leans: N/A  Psychiatric Specialty Exam: Review of Systems  Psychiatric/Behavioral: The patient is nervous/anxious.   All other systems reviewed and are negative.   There were no vitals taken for this visit.There is no height or weight on file to calculate BMI.  General Appearance: UTA  Eye Contact:  UTA  Speech:  Clear and Coherent  Volume:  Normal  Mood:  Anxious  Affect:  UTA  Thought Process:  Goal Directed and Descriptions of Associations: Intact  Orientation:  Full (Time, Place, and Person)  Thought Content: Logical   Suicidal Thoughts:  No  Homicidal Thoughts:  No  Memory:  Immediate;   Fair Recent;   Fair Remote;   Fair  Judgement:  Fair  Insight:  Fair  Psychomotor Activity:  UTA  Concentration:  Concentration: Fair and  Attention Span: Fair  Recall:  [employer] of Knowledge: Fair  Language: Fair  Akathisia:  No  Handed:  Right  AIMS (if indicated): Denies tremors, rigidity  Assets:  Communication Skills Desire for Improvement Housing Social Support  ADL's:  Intact  Cognition: WNL  Sleep:  Fair   Screenings: AIMS     Office Visit from 12/23/2018 in [place] Office Visit from 08/18/2018 in [place] Total Score  5  6    PHQ2-9     Office Visit from 05/09/2016 in [place] Procedure visit from 03/20/2016 in [place] Office Visit from 03/12/2016 in [place] Clinical Support from 02/13/2016 in [place] Office Visit from 12/14/2015 in [place]  PHQ-2 Total Score  0  0  0  0  0       Assessment and Plan: Tavi is a 63 year old Caucasian female who has a history  of bipolar disorder, anxiety disorder, was evaluated by today.  Patient was offered video call however declined.  Patient today reports she is currently doing well on the current medication regimen.  Plan Bipolar disorder- stable Trileptal 600 mg p.o. daily  For neuroleptic induced acute dystonia- resolved She is no longer on Latuda.  For anxiety disorder-improving Mirtazapine 22.5 mg p.o. nightly. Hydroxyzine 3 times a day as needed-12.5 to 25 mg.  For insomnia-improving Ambien as prescribed  Follow-up in clinic in 2 to 3 months or sooner if needed.  August 27 at 10:15 AM  I have spent atleast 15 minutes non face to face with patient today. More than 50 % of the time was spent for psychoeducation and supportive psychotherapy and care coordination.  This note was generated in part or whole with voice recognition software. Voice recognition is usually quite accurate but there are transcription errors that can and very often do occur. I apologize for any typographical errors that were not detected and corrected.          Ursula Alert, MD 05/05/2019, 5:32 PM

## 2019-05-31 ENCOUNTER — Telehealth: Payer: Self-pay

## 2019-05-31 DIAGNOSIS — F5105 Insomnia due to other mental disorder: Secondary | ICD-10-CM

## 2019-05-31 MED ORDER — ZOLPIDEM TARTRATE 10 MG PO TABS: ORAL_TABLET | ORAL | 0 refills | Status: DC

## 2019-05-31 NOTE — Telephone Encounter (Signed)
pt called pt states she needs all of [facility]  [facility]. pt has appt 06-24-19.

## 2019-05-31 NOTE — Telephone Encounter (Signed)
Pt has medication refills available except for her ambine - sent ambien.

## 2019-06-24 ENCOUNTER — Other Ambulatory Visit: Payer: Self-pay

## 2019-06-24 ENCOUNTER — Encounter: Payer: Self-pay | Admitting: Psychiatry

## 2019-06-24 ENCOUNTER — Ambulatory Visit (INDEPENDENT_AMBULATORY_CARE_PROVIDER_SITE_OTHER): Payer: PRIVATE HEALTH INSURANCE | Admitting: Psychiatry

## 2019-06-24 DIAGNOSIS — F5105 Insomnia due to other mental disorder: Secondary | ICD-10-CM | POA: Diagnosis not present

## 2019-06-24 DIAGNOSIS — F411 Generalized anxiety disorder: Secondary | ICD-10-CM

## 2019-06-24 DIAGNOSIS — F3176 Bipolar disorder, in full remission, most recent episode depressed: Secondary | ICD-10-CM

## 2019-06-24 NOTE — Progress Notes (Signed)
Virtual Visit via Telephone Note  I connected with Jennifer [name] on 06/24/19 at  2:15 PM EDT by telephone and verified that I am speaking with the correct person using two identifiers.   I discussed the limitations, risks, security and privacy concerns of performing an evaluation and management service by telephone and the availability of in person appointments. I also discussed with the patient that there may be a patient responsible charge related to this service. The patient expressed understanding and agreed to proceed.    I discussed the assessment and treatment plan with the patient. The patient was provided an opportunity to ask questions and all were answered. The patient agreed with the plan and demonstrated an understanding of the instructions.   The patient was advised to call back or seek an in-person evaluation if the symptoms worsen or if the condition fails to improve as anticipated.   [facility] MD OP Progress Note  06/24/2019 3:33 PM Jennifer [name]  MRN:  [id number]  Chief Complaint:  Chief Complaint    Follow-up     HPI: Jennifer [name] is a 63 year old Caucasian female, lives in [place], married, has a history of bipolar disorder, GAD, insomnia, neuroleptic induced dystonia currently resolved was evaluated by phone today.  Patient declined video consult.  Patient reports she is currently doing well on the current medication regimen.  She reports she recently had some flulike symptoms however she was told she does not have COVID virus.  She is relieved.  She reports her mood symptoms are okay on the current medication regimen.  She continues to take Ambien and reports sleep as well.  Patient denies any suicidality, homicidality or perceptual disturbances.  She continues to have support system from her family.  She denies any other concerns today. Visit Diagnosis:    ICD-10-CM   1. Bipolar I disorder, most recent episode depressed, in remission ([place])  F31.76   2.  Generalized anxiety disorder  F41.1   3. Insomnia due to mental disorder  F51.05     Past Psychiatric History: I have reviewed past psychiatric history from my progress note on 02/05/2018.  Past Medical History:  Past Medical History:  Diagnosis Date  . Anemia 1985   bled during a cervical biopsy, led to bleed transfusion   . Anginal pain ([place])   . Anxiety   . Arthritis    pt. reports that her neck is stiff   . Bipolar depression ([place])   . Blood dyscrasia    reported hx [name] '85; saw hematologist Dr. Janese Banks 10/2016, VWD work-up WNL, not felt to have a primary bleeding disorder   . CAD (coronary artery disease)   . 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 COPD (chronic obstructive pulmonary disease) ([place])    dr. Gar [facility]onto [facility]    PCP  . CRD (chronic renal disease)   . Depression   . Depression with anxiety   . Diabetes mellitus   . Dyspnea    W/ EXERTION   . Enlarged liver   . Family history of adverse reaction to anesthesia    father woke up slowly  . Fibromyalgia   . Generalized anxiety disorder   . GERD (gastroesophageal reflux disease)   . Hyperlipidemia   . Hypertension   . Hypothyroidism   . Low back pain   . Low sodium levels   . Migraines   . Neuropathy   .  Neuropathy   . Persistent disorder of initiating or maintaining sleep   . Restless leg   . Von Willebrand disease ([place])     Past Surgical History:  Procedure Laterality Date  . ABDOMINAL HYSTERECTOMY    . 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 APPLICATION OF  ROBOTIC ASSISTANCE FOR SPINAL PROCEDURE N/A 12/10/2016   Procedure: APPLICATION OF ROBOTIC ASSISTANCE FOR SPINAL PROCEDURE;  Surgeon: Kevan Ny Ditty, MD;  Location: [facility];  Service: Neurosurgery;  Laterality: N/A;  . BACK SURGERY  2007   lumbar  . BREAST IMPLANT REMOVAL    . CARDIAC CATHETERIZATION     CAD,PCI of LAD, Cypher stent 2.5-28mm  . CARDIAC CATHETERIZATION     PCI of mid-LAD in stent restenosis with a drug-eluting stent  . CARDIAC CATHETERIZATION  06/03/13   [facility];MID LAD 100 % STENOSIS; PRIOR STENT; MID RCA 40 % STENOSIS  . COLONOSCOPY WITH PROPOFOL N/A 08/13/2018   Procedure: COLONOSCOPY WITH PROPOFOL;  Surgeon: [name], MD;  Location: [facility] ENDOSCOPY;  Service: Gastroenterology;  Laterality: N/A;  . CORONARY ARTERY BYPASS GRAFT   08-12-2011   CABG x 3  . CORONARY STENT PLACEMENT    . ESOPHAGOGASTRODUODENOSCOPY (EGD) WITH PROPOFOL N/A 08/13/2018   Procedure: ESOPHAGOGASTRODUODENOSCOPY (EGD) WITH PROPOFOL;  Surgeon: [name], MD;  Location[facility] END[facility];  Service: Gastroenterology;  Laterality: N/A;  . LUMBAR FUSION     L1-S1  . LUMBAR PERCUTANEOUS PEDICLE SCREW 1 LEVEL N/A 12/10/2016   Procedure: Extension of pedicle screw fixation to LUMBAR FOUR;  Surgeon: Kevan Ny Ditty, MD;  Location: [facility];  Service: Neurosurgery;  Laterality: N/A;  . NASAL SINUS SURGERY  1997   tuirbinate reduction   . RECTAL EXAM UNDER ANESTHESIA N/A 10/06/2018   Procedure: RECTAL EXAM UNDER ANESTHESIA;  Surgeon: Jules Husbands, MD;  Location: [facility] ORS;  Service: General;  Laterality: N/A;    Family Psychiatric History: I have reviewed family psychiatric history from my progress note on 02/05/2018.  Family History:  Family History  Problem Relation Age of Onset  . Aneurysm Father        abdominal  . Diabetes Father   . Alcohol abuse Father   . Hypertension Father   . Aneurysm Brother        abdominal  . Alcohol abuse Brother   . Hypertension Brother   . Colon cancer Brother    . Diabetes Mother   . Alcohol abuse Mother   . Hypertension Other        family hx  . Hyperlipidemia Other        family hx  . Diabetes Other        family hx    Social History: I have reviewed social history from my progress note on 02/05/2018. Social History   Socioeconomic History  . Marital status: Married    Spouse name: Photographer  . Number of children: 3  . Years of education: Not on file  . Highest education level: Associate degree: occupational, Hotel manager, or vocational program  Occupational History  . Not on file  Social Needs  . Financial resource strain: Not on file  . Food  insecurity    Worry: Not on file    Inability: Not on file  . Transportation needs    Medical: No    Non-medical: No  Tobacco Use  . Smoking status: Former Smoker    Years: 16.00    Types: Cigarettes    Quit date: 06/20/2010    Years since quitting: 9.0  . Smokeless tobacco: Never Used  Substance and Sexual Activity  . Alcohol use: No    Alcohol/week: 0.0 standard drinks  . Drug use: No  . Sexual activity: Yes  Lifestyle  . Physical activity    Days per week: 0 days    Minutes per session: 0 min  . Stress: Rather much  Relationships  . Social Herbalist on phone: Not on file    Gets together: Not on file    Attends religious service: More than 4 times per year    Active member of club or organization: No    Attends meetings of clubs or organizations: Never    Relationship status: Married  Other Topics Concern  . Not on file  Social History Narrative  . Not on file    Allergies: No Known Allergies  Metabolic Disorder Labs: Lab Results  Component Value Date   HGBA1C 9.4 (H) 12/05/2017   MPG 223.08 12/05/2017   MPG 197 10/30/2016   Lab Results  Component Value Date   PROLACTIN 6.1 12/05/2017   Lab Results  Component Value Date   CHOL 238 (H) 12/05/2017   TRIG 819 (H) 12/05/2017   HDL 41 12/05/2017   CHOLHDL 5.8 12/05/2017   VLDL UNABLE TO CALCULATE IF  TRIGLYCERIDE OVER 400 mg/dL 12/05/2017   LDLCALC UNABLE TO CALCULATE IF TRIGLYCERIDE OVER 400 mg/dL 12/05/2017   LDLCALC SEE COMMENT 07/09/2012   Lab Results  Component Value Date   TSH 2.600 08/03/2018   TSH 86.000 (H) 12/05/2017    Therapeutic Level Labs: No results found for: LITHIUM No results found for: VALPROATE No components found for:  CBMZ  Current Medications: Current Outpatient Medications  Medication Sig Dispense Refill  . aspirin 325 MG tablet Take by mouth.    . fenofibrate micronized (LOFIBRA) 134 MG capsule Take by mouth.    . gabapentin (NEURONTIN) 300 MG capsule Take by mouth.    . Insulin Pen Needle (FIFTY50 PEN NEEDLES) 31G X 8 MM MISC 1 Each by Misc.(Non-Drug; Combo Route) route As Directed.    . Insulin Pen Needle (PEN NEEDLES 31GX5/16") 31G X 8 MM MISC 1 Each by Misc.(Non-Drug; Combo Route) route As Directed.    . Oxycodone HCl 10 MG TABS LIMIT 1 2 TO 1 (ONE HALF TO ONE) TABLET BY MOUTH 4 6 TIMES DAILY IF TOLERATED NO HYDROCODONE ACETAMINOPHEN    . albuterol-ipratropium (COMBIVENT) 18-103 MCG/ACT inhaler Inhale 2 puffs into the lungs daily as needed for wheezing or shortness of breath.     Marland Kitchen amLODipine (NORVASC) 5 MG tablet Take 1 tablet (5 mg total) by mouth 2 (two) times daily. 180 tablet 3  . aspirin EC 81 MG tablet Take 1 tablet (81 mg total) by mouth daily.    Marland Kitchen atorvastatin (LIPITOR) 80 MG tablet Take 1 tablet (80 mg total) by mouth daily. 90 tablet 3  . carisoprodol (SOMA) 350 MG tablet Take 1 tablet (350 mg total) by mouth every 6 (six) hours as needed for muscle spasms. (Patient taking differently: Take 350 mg by mouth 2 (two) times daily as needed for muscle spasms. ) 120  tablet 0  . carvedilol (COREG) 12.5 MG tablet Take 1 tablet (12.5 mg total) by mouth 2 (two) times daily with a meal. 180 tablet 3  . dicyclomine (BENTYL) 10 MG capsule Take 1 capsule (10 mg total) by mouth 4 (four) times daily -  before meals and at bedtime. 90 capsule 0  . estradiol  (ESTRACE) 0.5 MG tablet Place vaginally.    Marland Kitchen estradiol (ESTRING) 2 MG vaginal ring Place vaginally.    . fluticasone (FLOVENT HFA) 110 MCG/ACT inhaler Inhale into the lungs.    Marland Kitchen FREESTYLE LITE test strip     . HUMULIN 70/30 KWIKPEN (70-30) 100 UNIT/ML PEN Inject 10-15 Units into the skin See admin instructions. Inject 10-15 units SQ in the morning and inject 10-12 units SQ at bedtime per sliding scale    . HYDROcodone-acetaminophen (NORCO/VICODIN) 5-325 MG tablet Take 1 tablet by mouth every 6 (six) hours as needed for moderate pain. 12 tablet 0  . hydrOXYzine (ATARAX/VISTARIL) 25 MG tablet Take 0.5-1 tablets (12.5-25 mg total) by mouth 3 (three) times daily as needed for anxiety. 270 tablet 1  . ipratropium (ATROVENT) 0.06 % nasal spray Place into the nose.    Marland Kitchen ipratropium (ATROVENT) 0.06 % nasal spray USE 2 SPRAYS IN EACH NOSTRIL THREE TIMES DAILY FOR 14 DAYS    . lisinopril (PRINIVIL,ZESTRIL) 20 MG tablet Take 1 tablet (20 mg total) by mouth daily. 90 tablet 0  . metFORMIN (GLUCOPHAGE-XR) 750 MG 24 hr tablet Take by mouth.    . mirtazapine (REMERON) 15 MG tablet Take 1.5 tablets (22.5 mg total) by mouth at bedtime. 135 tablet 1  . nitroGLYCERIN (NITROSTAT) 0.4 MG SL tablet Place 1 tablet (0.4 mg total) under the tongue every 5 (five) minutes as needed for chest pain. 25 tablet 6  . omeprazole (PRILOSEC) 20 MG capsule Take 20 mg by mouth daily.     . Oxcarbazepine (TRILEPTAL) 300 MG tablet Take 2 tablets (600 mg total) by mouth at bedtime. 180 tablet 1  . oxyCODONE (OXY IR/ROXICODONE) 5 MG immediate release tablet Take 5 mg by mouth See admin instructions. Take 5 mg by mouth up to 5 times daily as needed for severe pain    . Oxycodone HCl 10 MG TABS LIMIT ONE HALF TO ONE TABLET BY MOUTH FOUR TO SIX TIMES PER DAY IF TOLERATED (NO HYDROCODONE ACETAMINOPHEN)    . oxyCODONE-acetaminophen (PERCOCET) 10-325 MG tablet Take by mouth.    . RESTASIS 0.05 % ophthalmic emulsion Place 1 drop into both eyes  daily as needed (for dryness).     Marland Kitchen rOPINIRole (REQUIP) 0.5 MG tablet Take 1 mg by mouth at bedtime.     . sitaGLIPtin (JANUVIA) 100 MG tablet Take 100 mg by mouth daily.    Marland Kitchen SYNTHROID 25 MCG tablet Take 25 mcg by mouth daily before breakfast.     . tolterodine (DETROL LA) 4 MG 24 hr capsule Take by mouth.    . TRULICITY 1.5 0000000 SOPN     . zolpidem (AMBIEN) 10 MG tablet TAKE 1/2 TO 1 (ONE-HALF TO ONE) TABLET BY MOUTH AT BEDTIME AS NEEDED FOR SLEEP 90 tablet 0   No current facility-administered medications for this visit.      Musculoskeletal: Strength & Muscle Tone: UTA Gait & Station: UTA Patient leans: N/A  Psychiatric Specialty Exam: Review of Systems  Psychiatric/Behavioral: Negative for depression. The patient is not nervous/anxious.   All other systems reviewed and are negative.   There were no vitals taken  for this visit.There is no height or weight on file to calculate BMI.  General Appearance: UTA  Eye Contact:  UTA  Speech:  Clear and Coherent  Volume:  Normal  Mood:  Euthymic  Affect:  UTA  Thought Process:  Goal Directed and Descriptions of Associations: Intact  Orientation:  Full (Time, Place, and Person)  Thought Content: Logical   Suicidal Thoughts:  No  Homicidal Thoughts:  No  Memory:  Immediate;   Fair Recent;   Fair Remote;   Fair  Judgement:  Fair  Insight:  Fair  Psychomotor Activity:  UTA  Concentration:  Concentration: Fair and Attention Span: Fair  Recall:  [employer] of Knowledge: Fair  Language: Fair  Akathisia:  No  Handed:  Right  AIMS (if indicated): Denies tremors, rigidity  Assets:  Communication Skills Desire for Improvement Social Support  ADL's:  Intact  Cognition: WNL  Sleep:  Fair   Screenings: AIMS     Office Visit from 12/23/2018 in [facility] Office Visit from 08/18/2018 in [facility] Total Score  5  6    PHQ2-9     Office Visit from 05/09/2016 in  [facility] Procedure visit from 03/20/2016 in [facility] Office Visit from 03/12/2016 in [facility] Clinical Support from 02/13/2016 in [facility] Office Visit from 12/14/2015 in [facility]  PHQ-2 Total Score  0  0  0  0  0       Assessment and Plan: Onya is a 63 year old Caucasian female who has a history of bipolar disorder, anxiety disorder was evaluated by phone today.  Patient is currently stable on current medication regimen.  Plan Bipolar disorder in remission Trileptal 600 mg p.o. daily  Anxiety disorder-stable Mirtazapine 22.5 mg p.o. nightly Hydroxyzine as needed that she rarely takes it.  For insomnia-stable Ambien as prescribed  Follow-up in clinic in 2 to 3 months or sooner if needed.  November 2 at 2 PM  I have spent atleast 15 minutes non face to face with patient today. More than 50 % of the time was spent for psychoeducation and supportive psychotherapy and care coordination. This note was generated in part or whole with voice recognition software. Voice recognition is usually quite accurate but there are transcription errors that can and very often do occur. I apologize for any typographical errors that were not detected and corrected.       Ursula Alert, MD 06/24/2019, 3:33 PM

## 2019-07-27 ENCOUNTER — Telehealth: Payer: Self-pay | Admitting: Cardiovascular Disease

## 2019-07-27 NOTE — Telephone Encounter (Signed)
Incoming call from patient with c/o L anterior chest cramping for 2 weeks. She denies any pain since last week. Was out of town so she is calling now to f/u. She denies any new cough or recent illness.   Pt recently had L shoulder arthritis pain and received shot 8/27. BP ranging from 130/87-190/106.  Pt expresses inc stress with home situation.   Had tele visit with Dr. Rockey Situ 4/1. Pt expressed chest pain at that time and was suggested that she may need cath if pain continues.   I made OV appt with Dr. Rockey Situ next Monday 10/5 @ 3:20PM, I urged patient it chest pain returns or of she has any new or worsening sx, she seek urgent medical attention.   Routing to [place] to make sure there are no further orders needed prior to OV.

## 2019-07-27 NOTE — Telephone Encounter (Signed)
Pt c/o of Chest Pain: STAT if CP now or developed within 24 hours  1. Are you having CP right now? No, almost like a cramp  2. Are you experiencing any other symptoms (ex. SOB, nausea, vomiting, sweating)? Tiredness, slight SOB  3. How long have you been experiencing CP? Past 2 weeks, feels like the pain "Grabs me"  4. Is your CP continuous or coming and going? Comes and goes  5. Have you taken Nitroglycerin? no ?

## 2019-07-28 NOTE — Telephone Encounter (Signed)
Nothing more to do will see her in clinic

## 2019-07-30 NOTE — Progress Notes (Signed)
Patient ID: Jennifer [name], female   DOB: June 03, 1956, 63 y.o.   MRN: [id number] Cardiology Office Note  Date:  08/02/2019   [name], [place] January 14, 1956, MRN [id number]  PCP:  Jennifer Castle, MD   Chief Complaint  Patient presents with  . other    Pt. c/o chest pain/pressure, shortness of breath and feeling very exhausted. Meds reviewed by the pt. verbally.     HPI:  63 year old woman with a history of  Medication noncompliance coronary artery disease,  PTCA of the LAD with 2.5 x 28 mm Cypher stent in January 2007,  Repeat catheterization August 2011 with Stent placement of the mid LAD for 90% lesion, catheterization August 06, 2011 for chest pain that showed severe mid LAD in-stent restenosis at the site of the previous drug-eluting stent ( DES stent x2 placed in the LAD in 2007 and 2011), also with severe mid RCA disease, moderate to severe proximal diagonal #1 disease, ejection fraction 55%. bypass surgery at [facility] on August 12 2011 by Dr. Roxan [name]. bypass graft x3 with a LIMA to the LAD, vein graft to the RCA, vein graft to the diagonal. She presents for routine follow-up of her coronary artery disease  On telemetry visit April 2020 3 to 4 weeks ago, was in [place], Bad chest pain, "hand squeezing her heart" Lots of stress, son living with them  son and daughter in law tested positive for drugs, Lost kids.   Since her last clinic visit she has continued to have episodes of chest pain Again she is concerned about ischemia/unstable angina though unable to exclude stress as a cause of her symptoms  Last stress test 2017  went on vacation several weeks ago, Had some chest pain Tries not to take NTG For over one month" feels like has a blockages  Not very active Fighting with her husband over some issues, he is fixing up a trailer  On meds for chronic pain  HBA1C 10 down to 7.8  She continues to see pain clinic, psychiatry  EKG on today's  visit shows normal sinus rhythm with rate 73 bpm, no significant ST or T-wave changes  Other past medical history problems with Chronic anxiety, stress at home, confusion at times. Treated by a psychiatrist in [place], started on Valium. She is tired all time, sleeping a good number of hours per day. In the past she has taken benzodiazepines, Prozac. previously on Lamictal and Cymbalta, She sees Dr. Eliberto [name], also sees Dr. Leilani [name] in [place]. She does travel up to [place] to help family .   cardiac catheterization 06/03/2013. 100% occluded mid LAD at the site of a stent, mild mid RCA disease, saphenous vein graft to the diagonal and LIMA graft to the LAD are patent. Occluded saphenous vein graft to the distal RCA. Medical management was recommended.  July 08 2012 right-sided chest pain. She ruled out for myocardial infarction by cardiac enzymes.  nuclear stress test which showed no evidence of ischemia with an ejection fraction of 44%. She was noted to be hypertensive with systolic blood pressure of 180.   PMH:   has a past medical history of Anemia (1985), Anginal pain ([place]), Anxiety, Arthritis, Bipolar depression ([place]), Blood dyscrasia, CAD (coronary artery disease), Cancer ([place]), COPD (chronic obstructive pulmonary disease) ([place]), CRD (chronic renal disease), Depression, Depression with anxiety, Diabetes mellitus, Dyspnea, Enlarged liver, Family history of adverse reaction to anesthesia, Fibromyalgia, Generalized anxiety disorder, GERD (gastroesophageal reflux disease), Hyperlipidemia, Hypertension, Hypothyroidism, Low back pain,  Low sodium levels, Migraines, Neuropathy, Neuropathy, Persistent disorder of initiating or maintaining sleep, Restless leg, and Von Willebrand disease ([place]).  PSH:    Past Surgical History:  Procedure Laterality Date  . ABDOMINAL HYSTERECTOMY    . 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 APPLICATION OF ROBOTIC ASSISTANCE FOR SPINAL PROCEDURE N/A 12/10/2016   Procedure: APPLICATION OF ROBOTIC ASSISTANCE FOR SPINAL PROCEDURE;  Surgeon: Kevan Ny Ditty, MD;  Location: [facility];  Service: Neurosurgery;  Laterality: N/A;  . BACK SURGERY  2007   lumbar  . BREAST IMPLANT REMOVAL    . CARDIAC CATHETERIZATION     CAD,PCI of LAD, Cypher stent 2.5-28mm  . CARDIAC CATHETERIZATION     PCI of mid-LAD in stent restenosis with a drug-eluting stent  . CARDIAC CATHETERIZATION  06/03/13   [facility];MID LAD 100 % STENOSIS; PRIOR STENT; MID RCA 40 % STENOSIS  . COLONOSCOPY WITH PROPOFOL N/A 08/13/2018   Procedure: COLONOSCOPY WITH PROPOFOL;  Surgeon: [name], MD;  Location: [facility] ENDOSCOPY;  Service: Gastroenterology;  Laterality: N/A;  . CORONARY ARTERY BYPASS GRAFT   08-12-2011   CABG x 3  . CORONARY STENT PLACEMENT    . ESOPHAGOGASTRODUODENOSCOPY (EGD) WITH PROPOFOL N/A 08/13/2018   Procedure: ESOPHAGOGASTRODUODENOSCOPY (EGD) WITH PROPOFOL;  Surgeon: [name], MD;  Location: [facility] ENDOSCOPY;  Service: Gastroenterology;  Laterality: N/A;  . LUMBAR FUSION     L1-S1  . LUMBAR PERCUTANEOUS PEDICLE SCREW 1 LEVEL N/A 12/10/2016   Procedure: Extension of pedicle screw fixation to LUMBAR FOUR;  Surgeon: Kevan Ny Ditty, MD;  Location: [facility];  Service: Neurosurgery;  Laterality: N/A;  . NASAL SINUS SURGERY  1997   tuirbinate reduction   . RECTAL EXAM UNDER ANESTHESIA N/A 10/06/2018   Procedure: RECTAL EXAM UNDER ANESTHESIA;  Surgeon: Jules Husbands, MD;  Location: [facility] ORS;  Service: General;  Laterality: N/A;    Current Outpatient Medications  Medication Sig  Dispense Refill  . albuterol-ipratropium (COMBIVENT) 18-103 MCG/ACT inhaler Inhale 2 puffs into the lungs daily as needed for wheezing or shortness of breath.     Marland Kitchen amLODipine (NORVASC) 5 MG tablet Take 1 tablet (5 mg total) by mouth 2 (two) times daily. 180 tablet 3  . aspirin 81 MG chewable tablet Chew by mouth daily.    Marland Kitchen aspirin EC 81 MG tablet Take 1 tablet (81 mg total) by mouth daily.    Marland Kitchen atorvastatin (LIPITOR) 80 MG tablet Take 1 tablet (80 mg total) by mouth daily. 90 tablet 3  . carisoprodol (SOMA) 350 MG tablet Take 1 tablet (350 mg total) by mouth every 6 (six) hours as needed for muscle spasms. (Patient taking differently: Take 350 mg by mouth 2 (two) times daily as needed for muscle spasms. ) 120 tablet 0  . carvedilol (COREG) 12.5 MG tablet Take 1 tablet (12.5 mg total) by mouth 2 (two) times daily with a meal. 180 tablet 3  . estradiol (ESTRACE) 0.5 MG tablet Place vaginally.    . fluticasone (FLOVENT HFA) 110 MCG/ACT inhaler Inhale into the lungs.    Marland Kitchen FREESTYLE LITE test strip     . gabapentin (NEURONTIN) 300 MG capsule Take  300 mg by mouth 2 (two) times daily.     Marland Kitchen HUMULIN 70/30 KWIKPEN (70-30) 100 UNIT/ML PEN Inject 10-15 Units into the skin See admin instructions. Inject 10-15 units SQ in the morning and inject 10-12 units SQ at bedtime per sliding scale    . Insulin Pen Needle (FIFTY50 PEN NEEDLES) 31G X 8 MM MISC 1 Each by Misc.(Non-Drug; Combo Route) route As Directed.    . Insulin Pen Needle (PEN NEEDLES 31GX5/16") 31G X 8 MM MISC 1 Each by Misc.(Non-Drug; Combo Route) route As Directed.    Marland Kitchen lisinopril (PRINIVIL,ZESTRIL) 20 MG tablet Take 1 tablet (20 mg total) by mouth daily. 90 tablet 0  . mirtazapine (REMERON) 15 MG tablet Take 1.5 tablets (22.5 mg total) by mouth at bedtime. 135 tablet 1  . nitroGLYCERIN (NITROSTAT) 0.4 MG SL tablet Place 1 tablet (0.4 mg total) under the tongue every 5 (five) minutes as needed for chest pain. 25 tablet 6  . omeprazole (PRILOSEC) 20  MG capsule Take 20 mg by mouth daily.     . Oxcarbazepine (TRILEPTAL) 300 MG tablet Take 2 tablets (600 mg total) by mouth at bedtime. 180 tablet 1  . Oxycodone HCl 10 MG TABS LIMIT ONE HALF TO ONE TABLET BY MOUTH FOUR TO SIX TIMES PER DAY IF TOLERATED (NO HYDROCODONE ACETAMINOPHEN)    . RESTASIS 0.05 % ophthalmic emulsion Place 1 drop into both eyes daily as needed (for dryness).     Marland Kitchen rOPINIRole (REQUIP) 0.5 MG tablet Take 1 mg by mouth at bedtime.     . sitaGLIPtin (JANUVIA) 100 MG tablet Take 100 mg by mouth daily.    Marland Kitchen SYNTHROID 25 MCG tablet Take 25 mcg by mouth daily before breakfast.     . zolpidem (AMBIEN) 10 MG tablet TAKE 1/2 TO 1 (ONE-HALF TO ONE) TABLET BY MOUTH AT BEDTIME AS NEEDED FOR SLEEP 90 tablet 0  . estradiol (ESTRING) 2 MG vaginal ring Place vaginally.    Marland Kitchen HYDROcodone-acetaminophen (NORCO/VICODIN) 5-325 MG tablet Take 1 tablet by mouth every 6 (six) hours as needed for moderate pain. (Patient not taking: Reported on 08/02/2019) 12 tablet 0  . hydrOXYzine (ATARAX/VISTARIL) 25 MG tablet Take 0.5-1 tablets (12.5-25 mg total) by mouth 3 (three) times daily as needed for anxiety. (Patient not taking: Reported on 08/02/2019) 270 tablet 1  . oxyCODONE (OXY IR/ROXICODONE) 5 MG immediate release tablet Take 5 mg by mouth See admin instructions. Take 5 mg by mouth up to 5 times daily as needed for severe pain    . Oxycodone HCl 10 MG TABS LIMIT 1 2 TO 1 (ONE HALF TO ONE) TABLET BY MOUTH 4 6 TIMES DAILY IF TOLERATED NO HYDROCODONE ACETAMINOPHEN    . oxyCODONE-acetaminophen (PERCOCET) 10-325 MG tablet Take by mouth.     No current facility-administered medications for this visit.      Allergies:   Patient has no known allergies.   Social History:  The patient  reports that she quit smoking about 9 years ago. Her smoking use included cigarettes. She quit after 16.00 years of use. She has never used smokeless tobacco. She reports that she does not drink alcohol or use drugs.   Family  History:   family history includes Alcohol abuse in her brother, father, and mother; Aneurysm in her brother and father; Colon cancer in her brother; Diabetes in her father, mother, and another family member; Hyperlipidemia in an other family member; Hypertension in her brother, father, and another family member.  Review of Systems: Review of Systems  Constitutional: Negative.   Respiratory: Negative.   Cardiovascular: Positive for chest pain.  Gastrointestinal: Negative.   Musculoskeletal: Negative.   Neurological: Negative.   Psychiatric/Behavioral: Negative.   All other systems reviewed and are negative.    PHYSICAL EXAM: VS:  BP 128/78 (BP Location: Left Arm, Patient Position: Sitting, Cuff Size: Normal)   Pulse 73   Temp (!) 97.1 F (36.2 C)   Ht 5\' 3"  (1.6 m)   Wt 159 lb 12 oz (72.5 kg)   BMI 28.30 kg/m  , BMI Body mass index is 28.3 kg/m.  No change from previous visit GEN: Well nourished, well developed, in no acute distress  HEENT: normal  Neck: no JVD, carotid bruits, or masses Cardiac: RRR; no murmurs, rubs, or gallops,no edema  Respiratory:  clear to auscultation bilaterally, normal work of breathing GI: soft, nontender, nondistended, + BS  MS: no deformity or atrophy  Skin: warm and dry, no rash Neuro:  Strength and sensation are intact Psych: euthymic mood, full affect  Recent Labs: 08/03/2018: ALT 15; BUN 10; Creatinine, Ser 0.67; Hemoglobin 12.4; Platelets 423; Potassium 3.8; Sodium 127; TSH 2.600    Lipid Panel Lab Results  Component Value Date   CHOL 238 (H) 12/05/2017   HDL 41 12/05/2017   LDLCALC UNABLE TO CALCULATE IF TRIGLYCERIDE OVER 400 mg/dL 12/05/2017   TRIG 819 (H) 12/05/2017      Wt Readings from Last 3 Encounters:  08/02/19 159 lb 12 oz (72.5 kg)  10/06/18 158 lb 15.2 oz (72.1 kg)  10/01/18 159 lb (72.1 kg)       ASSESSMENT AND PLAN:  Chest pain, unspecified chest pain type -  Previous stress test end of 2017 Chronic chest  pain symptoms often exacerbated by stress On her last clinic visit we offered a stress test for chest pain symptoms and she declined She is willing to proceed with ischemic work-up given continued chest pain symptoms Uncertain if she is having chest pain from stress given issues at home -Pharmacological Myoview has been ordered  Angina pectoris, unspecified ([place]) -  Stress test ordered given chest pain symptoms, some atypical as well as typical features  Hyperlipidemia Some medication noncompliance, ran out of her statin for several months Now has refill, back on the medication  SMOKER Recommended smoking cessation  Type 2 diabetes mellitus with other circulatory complication ([place]) Poorly controlled diabetes secondary to medication non compliance More compliant recently, numbers improving Recommended strict compliance  Depression/anxiety Long history of medication noncompliance Reports having stress at home, fighting with her husband   Total encounter time more than 25 minutes  Greater than 50% was spent in counseling and coordination of care with the patient   Disposition:   F/U  6 months We will call her with the results of her stress test   Orders Placed This Encounter  Procedures  . NM Myocar Multi W/Spect W/Wall Motion / EF  . EKG 12-Lead     Signed, Esmond Plants, M.D., Ph.D. 08/02/2019  [place], [place] [phone]

## 2019-08-02 ENCOUNTER — Encounter: Payer: Self-pay | Admitting: Cardiovascular Disease

## 2019-08-02 ENCOUNTER — Ambulatory Visit (INDEPENDENT_AMBULATORY_CARE_PROVIDER_SITE_OTHER): Admitting: Cardiovascular Disease

## 2019-08-02 ENCOUNTER — Other Ambulatory Visit: Payer: Self-pay

## 2019-08-02 VITALS — BP 128/78 | HR 73 | Temp 97.1°F | Ht 63.0 in | Wt 159.8 lb

## 2019-08-02 DIAGNOSIS — R072 Precordial pain: Secondary | ICD-10-CM

## 2019-08-02 DIAGNOSIS — E782 Mixed hyperlipidemia: Secondary | ICD-10-CM | POA: Diagnosis not present

## 2019-08-02 DIAGNOSIS — E1159 Type 2 diabetes mellitus with other circulatory complications: Secondary | ICD-10-CM | POA: Diagnosis not present

## 2019-08-02 DIAGNOSIS — I10 Essential (primary) hypertension: Secondary | ICD-10-CM

## 2019-08-02 DIAGNOSIS — I25118 Atherosclerotic heart disease of native coronary artery with other forms of angina pectoris: Secondary | ICD-10-CM | POA: Diagnosis not present

## 2019-08-02 DIAGNOSIS — F17200 Nicotine dependence, unspecified, uncomplicated: Secondary | ICD-10-CM

## 2019-08-02 DIAGNOSIS — I739 Peripheral vascular disease, unspecified: Secondary | ICD-10-CM

## 2019-08-02 NOTE — Patient Instructions (Addendum)
We will order a lexiscan myoview for known CAD, chest pain  Medication Instructions:  No changes  If you need a refill on your cardiac medications before your next appointment, please call your pharmacy.    Lab work: No new labs needed   If you have labs (blood work) drawn today and your tests are completely normal, you will receive your results only by: Marland Kitchen MyChart Message (if you have MyChart) OR . A paper copy in the mail If you have any lab test that is abnormal or we need to change your treatment, we will call you to review the results.   Testing/Procedures: Your physician has requested that you have a lexiscan myoview. For further information please visit [email or web address]. Please follow instruction sheet, as given.     Follow-Up: At [facility], you and your health needs are our priority.  As part of our continuing mission to provide you with exceptional heart care, we have created designated Provider Care Teams.  These Care Teams include your primary Cardiologist (physician) and Advanced Practice Providers (APPs -  Physician Assistants and Nurse Practitioners) who all work together to provide you with the care you need, when you need it.  . You will need a follow up appointment in 6 months .   Please call our office 2 months in advance to schedule this appointment.    . Providers on your designated Care Team:   . Murray Hodgkins, NP . Christell Faith, PA-C . Marrianne Mood, PA-C  Any Other Special Instructions Will Be Listed Below (If Applicable).  For educational health videos Log in to : [email or web address] Or : [email or web address], password : [email or web address]   [place]  Your caregiver has ordered a Stress Test with nuclear imaging. The purpose of this test is to evaluate the blood supply to your heart muscle. This procedure is referred to as a "Non-Invasive Stress Test." This is because other than having an IV started in your vein, nothing is inserted or "invades" your body.  Cardiac stress tests are done to find areas of poor blood flow to the heart by determining the extent of coronary artery disease (CAD). Some patients exercise on a treadmill, which naturally increases the blood flow to your heart, while others who are  unable to walk on a treadmill due to physical limitations have a pharmacologic/chemical stress agent called Lexiscan . This medicine will mimic walking on a treadmill by temporarily increasing your coronary blood flow.   Please note: these test may take anywhere between 2-4 hours to complete  PLEASE REPORT TO [facility] AT THE FIRST DESK WILL DIRECT YOU WHERE TO GO  Date of Procedure:_____________________________________  Arrival Time for Procedure:______________________________  Instructions regarding medication:   __X__ : Hold diabetes medication morning of procedure  __X__:  Hold betablocker(Carvedilol) night before procedure and morning of procedure  _X___:  Hold other medications as follows:__Amlodipine the morning of your test _  PLEASE NOTIFY THE OFFICE AT LEAST 24 HOURS IN ADVANCE IF YOU ARE UNABLE TO [place].  [phone] AND  PLEASE NOTIFY NUCLEAR MEDICINE AT [facility] AT LEAST 24 HOURS IN ADVANCE IF YOU ARE UNABLE TO KEEP YOUR APPOINTMENT. [phone]  How to prepare for your Myoview test:  1. Do not eat or drink after midnight 2. No caffeine for 24 hours prior to test 3. No smoking 24 hours prior to test. 4. Your medication may be taken with water.  If your doctor stopped a medication because of this  test, do not take that medication. 5. Ladies, please do not wear dresses.  Skirts or pants are appropriate. Please wear a short sleeve shirt. 6. No perfume, cologne or lotion. 7. Wear comfortable walking shoes. No heels!

## 2019-08-05 ENCOUNTER — Other Ambulatory Visit: Payer: Self-pay | Admitting: Psychiatry

## 2019-08-05 DIAGNOSIS — F3130 Bipolar disorder, current episode depressed, mild or moderate severity, unspecified: Secondary | ICD-10-CM

## 2019-08-05 DIAGNOSIS — F5105 Insomnia due to other mental disorder: Secondary | ICD-10-CM

## 2019-08-05 DIAGNOSIS — F411 Generalized anxiety disorder: Secondary | ICD-10-CM

## 2019-08-25 ENCOUNTER — Other Ambulatory Visit: Payer: Self-pay | Admitting: Psychiatry

## 2019-08-25 DIAGNOSIS — F5105 Insomnia due to other mental disorder: Secondary | ICD-10-CM

## 2019-08-26 ENCOUNTER — Encounter
Admission: RE | Admit: 2019-08-26 | Discharge: 2019-08-26 | Disposition: A | Discharge status: Home / Self Care | Source: Ambulatory Visit | Attending: Cardiovascular Disease | Admitting: Cardiovascular Disease

## 2019-08-26 ENCOUNTER — Other Ambulatory Visit: Payer: Self-pay

## 2019-08-26 DIAGNOSIS — I25118 Atherosclerotic heart disease of native coronary artery with other forms of angina pectoris: Secondary | ICD-10-CM | POA: Diagnosis present

## 2019-08-26 DIAGNOSIS — R072 Precordial pain: Secondary | ICD-10-CM | POA: Insufficient documentation

## 2019-08-26 MED ORDER — REGADENOSON 0.4 MG/5ML IV SOLN
0.4000 mg | Freq: Once | INTRAVENOUS | Status: AC
  Administered 2019-08-26: 0.4 mg via INTRAVENOUS

## 2019-08-26 MED ORDER — TECHNETIUM TC 99M TETROFOSMIN IV KIT
10.8000 | PACK | Freq: Once | INTRAVENOUS | Status: AC | PRN
  Administered 2019-08-26: 10.8 via INTRAVENOUS

## 2019-08-26 MED ORDER — TECHNETIUM TC 99M TETROFOSMIN IV KIT
30.0000 | PACK | Freq: Once | INTRAVENOUS | Status: AC | PRN
  Administered 2019-08-26: 30.902 via INTRAVENOUS

## 2019-08-27 LAB — NM MYOCAR MULTI W/SPECT W/WALL MOTION / EF
Estimated workload: 1 METS
Exercise duration (min): 0 min
Exercise duration (sec): 0 s
LV dias vol: 64 mL (ref 46–106)
LV sys vol: 33 mL
MPHR: 156 {beats}/min
Peak HR: 83 {beats}/min
Percent HR: 53 %
Rest HR: 62 {beats}/min
SDS: 0
SRS: 1
SSS: 1
TID: 0.91

## 2019-08-30 ENCOUNTER — Encounter: Payer: Self-pay | Admitting: Psychiatry

## 2019-08-30 ENCOUNTER — Other Ambulatory Visit: Payer: Self-pay

## 2019-08-30 ENCOUNTER — Ambulatory Visit (INDEPENDENT_AMBULATORY_CARE_PROVIDER_SITE_OTHER): Admitting: Psychiatry

## 2019-08-30 DIAGNOSIS — F411 Generalized anxiety disorder: Secondary | ICD-10-CM | POA: Diagnosis not present

## 2019-08-30 DIAGNOSIS — F5105 Insomnia due to other mental disorder: Secondary | ICD-10-CM

## 2019-08-30 DIAGNOSIS — F3175 Bipolar disorder, in partial remission, most recent episode depressed: Secondary | ICD-10-CM

## 2019-08-30 MED ORDER — OXCARBAZEPINE 300 MG PO TABS: 900.0000 mg | ORAL_TABLET | Freq: Every day | ORAL | 1 refills | Status: DC

## 2019-08-30 NOTE — Progress Notes (Signed)
Virtual Visit via Video Note  I connected with Jennifer [name] on 08/30/19 at  2:00 PM EST by a video enabled telemedicine application and verified that I am speaking with the correct person using two identifiers.   I discussed the limitations of evaluation and management by telemedicine and the availability of in person appointments. The patient expressed understanding and agreed to proceed.     I discussed the assessment and treatment plan with the patient. The patient was provided an opportunity to ask questions and all were answered. The patient agreed with the plan and demonstrated an understanding of the instructions.   The patient was advised to call back or seek an in-person evaluation if the symptoms worsen or if the condition fails to improve as anticipated.  [facility] MD OP Progress Note  08/30/2019 3:12 PM Jennifer [name]  MRN:  [id number]  Chief Complaint:  Chief Complaint    Follow-up     HPI: Jennifer [name] is a 63 year old Caucasian female, lives in [place], married, has a history of bipolar disorder, GAD, insomnia, neuroleptic induced dystonia currently resolved was evaluated by telemedicine today.  A video call was initiated however due to connection problem it had to be changed to a phone call.  Patient today reports she continues to struggle with mood swings.  She reports she has a lot of psychosocial stressors.  She reports she has to help out her grandchildren who are currently doing remote learning.  She also reports her husband is going to retire soon and that is going to be a big change.  She reports her husband is kind of rigid with his rules and things he does around the house and that can be stressful.  She reports sleep is okay.  Patient denies any suicidality, homicidality or perceptual disturbances.  Patient reports she is interested in psychotherapy sessions.  Encouraged her to restart her therapy sessions with Ms. Peacock.  Patient denies any other concerns  today. Visit Diagnosis:    ICD-10-CM   1. Bipolar disorder, in partial remission, most recent episode depressed (HCC)  F31.75 Oxcarbazepine (TRILEPTAL) 300 MG tablet  2. Generalized anxiety disorder  F41.1   3. Insomnia due to mental disorder  F51.05     Past Psychiatric History: I have reviewed past psychiatric history from my progress note on 02/05/2018  Past Medical History:  Past Medical History:  Diagnosis Date  . Anemia 1985   bled during a cervical biopsy, led to bleed transfusion   . Anginal pain ([place])   . Anxiety   . Arthritis    pt. reports that her neck is stiff   . Bipolar depression ([place])   . Blood dyscrasia    reported hx [name] '85; saw hematologist Dr. Janese Banks 10/2016, VWD work-up WNL, not felt to have a primary bleeding disorder   . CAD (coronary artery disease)   . 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 COPD (chronic obstructive pulmonary disease) ([place])    dr. Gar [facility]    PCP  . CRD (chronic renal disease)   . Depression   . Depression with anxiety   . Diabetes mellitus   . Dyspnea    W/ EXERTION   . Enlarged liver   . Family history of adverse reaction to anesthesia    father woke up slowly  . Fibromyalgia   . Generalized anxiety disorder   . GERD (gastroesophageal reflux  disease)   . Hyperlipidemia   . Hypertension   . Hypothyroidism   . Low back pain   . Low sodium levels   . Migraines   . Neuropathy   . Neuropathy   . Persistent disorder of initiating or maintaining sleep   . Restless leg   . Von Willebrand disease ([place])     Past Surgical History:  Procedure Laterality Date  . ABDOMINAL HYSTERECTOMY    . 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 APPLICATION OF ROBOTIC ASSISTANCE FOR SPINAL PROCEDURE N/A 12/10/2016   Procedure: APPLICATION OF ROBOTIC ASSISTANCE FOR SPINAL PROCEDURE;  Surgeon: Kevan Ny Ditty, MD;  Location: [facility];  Service: Neurosurgery;  Laterality: N/A;  . BACK SURGERY  2007   lumbar  . BREAST IMPLANT REMOVAL    . CARDIAC CATHETERIZATION     CAD,PCI of LAD, Cypher stent 2.5-28mm  . CARDIAC CATHETERIZATION     PCI of mid-LAD in stent restenosis with a drug-eluting stent  . CARDIAC CATHETERIZATION  06/03/13   [facility];MID LAD 100 % STENOSIS; PRIOR STENT; MID RCA 40 % STENOSIS  . COLONOSCOPY WITH PROPOFOL N/A 08/13/2018   Procedure: COLONOSCOPY WITH PROPOFOL;  Surgeon: [name], MD;  Location: [facility] END[facility]COPY;  Service: Gastroenterology;  Laterality: N/A;  . CORONARY ARTERY BYPASS GRAFT   08-12-2011   CABG x 3  . CORONARY STENT PLACEMENT    . ESOPHAGOGASTRODUODENOSCOPY (EGD) WITH PROPOFOL N/A 08/13/2018   Procedure: ESOPHAGOGASTRODUODENOSCOPY (EGD) WITH PROPOFOL;  Surgeon: [name], MD;  Location: [facility] END[facility]COPY;  Service: Gastroenterology;  Laterality: N/A;  . LUMBAR FUSION     L1-S1  . LUMBAR PERCUTANEOUS PEDICLE SCREW 1 LEVEL N/A 12/10/2016   Procedure: Extension of pedicle screw fixation to LUMBAR FOUR;  Surgeon: Kevan Ny Ditty, MD;  Location: [facility];  Service: Neurosurgery;  Laterality: N/A;  . NASAL SINUS SURGERY  1997   tuirbinate reduction   . RECTAL EXAM UNDER ANESTHESIA N/A 10/06/2018   Procedure: RECTAL EXAM UNDER ANESTHESIA;  Surgeon: Jules Husbands, MD;  Location: [facility] ORS;  Service: General;  Laterality: N/A;    Family Psychiatric History: I have reviewed family psychiatric history from my progress note on 02/05/2018  Family History:  Family History  Problem Relation Age of Onset  . Aneurysm Father        abdominal  . Diabetes Father    . Alcohol abuse Father   . Hypertension Father   . Aneurysm Brother        abdominal  . Alcohol abuse Brother   . Hypertension Brother   . Colon cancer Brother   . Diabetes Mother   . Alcohol abuse Mother   . Hypertension Other        family hx  . Hyperlipidemia Other        family hx  . Diabetes Other        family hx    Social History: Reviewed social history from my progress note on 02/05/2018 Social History   Socioeconomic History  . Marital status: Married    Spouse name: Photographer  . Number of children: 3  . Years of education: Not on file  .  Highest education level: Associate degree: occupational, Hotel manager, or vocational program  Occupational History  . Not on file  Social Needs  . Financial resource strain: Not on file  . Food insecurity    Worry: Not on file    Inability: Not on file  . Transportation needs    Medical: No    Non-medical: No  Tobacco Use  . Smoking status: Former Smoker    Years: 16.00    Types: Cigarettes    Quit date: 06/20/2010    Years since quitting: 9.2  . Smokeless tobacco: Never Used  Substance and Sexual Activity  . Alcohol use: No    Alcohol/week: 0.0 standard drinks  . Drug use: No  . Sexual activity: Yes  Lifestyle  . Physical activity    Days per week: 0 days    Minutes per session: 0 min  . Stress: Rather much  Relationships  . Social Herbalist on phone: Not on file    Gets together: Not on file    Attends religious service: More than 4 times per year    Active member of club or organization: No    Attends meetings of clubs or organizations: Never    Relationship status: Married  Other Topics Concern  . Not on file  Social History Narrative  . Not on file    Allergies: No Known Allergies  Metabolic Disorder Labs: Lab Results  Component Value Date   HGBA1C 9.4 (H) 12/05/2017   MPG 223.08 12/05/2017   MPG 197 10/30/2016   Lab Results  Component Value Date   PROLACTIN 6.1 12/05/2017   Lab  Results  Component Value Date   CHOL 238 (H) 12/05/2017   TRIG 819 (H) 12/05/2017   HDL 41 12/05/2017   CHOLHDL 5.8 12/05/2017   VLDL UNABLE TO CALCULATE IF TRIGLYCERIDE OVER 400 mg/dL 12/05/2017   LDLCALC UNABLE TO CALCULATE IF TRIGLYCERIDE OVER 400 mg/dL 12/05/2017   LDLCALC SEE COMMENT 07/09/2012   Lab Results  Component Value Date   TSH 2.600 08/03/2018   TSH 86.000 (H) 12/05/2017    Therapeutic Level Labs: No results found for: LITHIUM No results found for: VALPROATE No components found for:  CBMZ  Current Medications: Current Outpatient Medications  Medication Sig Dispense Refill  . metFORMIN (GLUCOPHAGE-XR) 750 MG 24 hr tablet Take by mouth.    . mirabegron ER (MYRBETRIQ) 25 MG TB24 tablet Take by mouth.    Marland Kitchen albuterol-ipratropium (COMBIVENT) 18-103 MCG/ACT inhaler Inhale 2 puffs into the lungs daily as needed for wheezing or shortness of breath.     Marland Kitchen amLODipine (NORVASC) 5 MG tablet Take 1 tablet (5 mg total) by mouth 2 (two) times daily. 180 tablet 3  . aspirin 81 MG chewable tablet Chew by mouth daily.    Marland Kitchen aspirin EC 81 MG tablet Take 1 tablet (81 mg total) by mouth daily.    Marland Kitchen atorvastatin (LIPITOR) 80 MG tablet Take 1 tablet (80 mg total) by mouth daily. 90 tablet 3  . carisoprodol (SOMA) 350 MG tablet Take 1 tablet (350 mg total) by mouth every 6 (six) hours as needed for muscle spasms. (Patient taking differently: Take 350 mg by mouth 2 (two) times daily as needed for muscle spasms. ) 120 tablet 0  . carvedilol (COREG) 12.5 MG tablet Take 1 tablet (12.5 mg total) by mouth 2 (two) times daily with a meal. 180 tablet 3  . estradiol (ESTRACE) 0.5 MG tablet Place vaginally.    Marland Kitchen estradiol (  ESTRING) 2 MG vaginal ring Place vaginally.    . fluticasone (FLOVENT HFA) 110 MCG/ACT inhaler Inhale into the lungs.    Marland Kitchen FREESTYLE LITE test strip     . gabapentin (NEURONTIN) 300 MG capsule Take 300 mg by mouth 2 (two) times daily.     Marland Kitchen HUMULIN 70/30 KWIKPEN (70-30) 100 UNIT/ML  PEN Inject 10-15 Units into the skin See admin instructions. Inject 10-15 units SQ in the morning and inject 10-12 units SQ at bedtime per sliding scale    . HYDROcodone-acetaminophen (NORCO/VICODIN) 5-325 MG tablet Take 1 tablet by mouth every 6 (six) hours as needed for moderate pain. (Patient not taking: Reported on 08/02/2019) 12 tablet 0  . hydrOXYzine (ATARAX/VISTARIL) 25 MG tablet Take 0.5-1 tablets (12.5-25 mg total) by mouth 3 (three) times daily as needed for anxiety. (Patient not taking: Reported on 08/02/2019) 270 tablet 1  . Insulin Pen Needle (FIFTY50 PEN NEEDLES) 31G X 8 MM MISC 1 Each by Misc.(Non-Drug; Combo Route) route As Directed.    . Insulin Pen Needle (PEN NEEDLES 31GX5/16") 31G X 8 MM MISC 1 Each by Misc.(Non-Drug; Combo Route) route As Directed.    Marland Kitchen lisinopril (PRINIVIL,ZESTRIL) 20 MG tablet Take 1 tablet (20 mg total) by mouth daily. 90 tablet 0  . mirtazapine (REMERON) 15 MG tablet TAKE ONE AND ONE-HALF TABLETS AT BEDTIME 135 tablet 3  . nitroGLYCERIN (NITROSTAT) 0.4 MG SL tablet Place 1 tablet (0.4 mg total) under the tongue every 5 (five) minutes as needed for chest pain. 25 tablet 6  . omeprazole (PRILOSEC) 20 MG capsule Take 20 mg by mouth daily.     . Oxcarbazepine (TRILEPTAL) 300 MG tablet Take 3 tablets (900 mg total) by mouth at bedtime. 270 tablet 1  . oxyCODONE (OXY IR/ROXICODONE) 5 MG immediate release tablet Take 5 mg by mouth See admin instructions. Take 5 mg by mouth up to 5 times daily as needed for severe pain    . Oxycodone HCl 10 MG TABS LIMIT ONE HALF TO ONE TABLET BY MOUTH FOUR TO SIX TIMES PER DAY IF TOLERATED (NO HYDROCODONE ACETAMINOPHEN)    . Oxycodone HCl 10 MG TABS LIMIT 1 2 TO 1 (ONE HALF TO ONE) TABLET BY MOUTH 4 6 TIMES DAILY IF TOLERATED NO HYDROCODONE ACETAMINOPHEN    . oxyCODONE-acetaminophen (PERCOCET) 10-325 MG tablet Take by mouth.    . RESTASIS 0.05 % ophthalmic emulsion Place 1 drop into both eyes daily as needed (for dryness).     Marland Kitchen  rOPINIRole (REQUIP) 0.5 MG tablet Take 1 mg by mouth at bedtime.     . sitaGLIPtin (JANUVIA) 100 MG tablet Take 100 mg by mouth daily.    Marland Kitchen SYNTHROID 25 MCG tablet Take 25 mcg by mouth daily before breakfast.     . zolpidem (AMBIEN) 10 MG tablet TAKE ONE-HALF (1/2) TO 1 TABLET AT BEDTIME AS NEEDED FOR SLEEP 90 tablet 0   No current facility-administered medications for this visit.      Musculoskeletal: Strength & Muscle Tone: UTA Gait & Station: Reports as WNL Patient leans: N/A  Psychiatric Specialty Exam: Review of Systems  Psychiatric/Behavioral: The patient is nervous/anxious.   All other systems reviewed and are negative.   There were no vitals taken for this visit.There is no height or weight on file to calculate BMI.  General Appearance: UTA  Eye Contact:  UTA  Speech:  Clear and Coherent  Volume:  Normal  Mood:  Anxious  Affect:  UTA  Thought Process:  Goal Directed and Descriptions of Associations: Intact  Orientation:  Full (Time, Place, and Person)  Thought Content: Logical   Suicidal Thoughts:  No  Homicidal Thoughts:  No  Memory:  Immediate;   Fair Recent;   Fair Remote;   Fair  Judgement:  Fair  Insight:  Fair  Psychomotor Activity:  UTA  Concentration:  Concentration: Fair and Attention Span: Fair  Recall:  [employer] of Knowledge: Fair  Language: Fair  Akathisia:  No  Handed:  Right  AIMS (if indicated): denies tremors, rigidity  Assets:  Communication Skills Desire for Improvement Social Support  ADL's:  Intact  Cognition: WNL  Sleep:  Fair   Screenings: AIMS     Office Visit from 12/23/2018 in [facility] Office Visit from 08/18/2018 in [facility] Total Score  5  6    PHQ2-9     Office Visit from 05/09/2016 in [facility] Procedure visit from 03/20/2016 in [facility] Office Visit from 03/12/2016  in [place] Clinical Support from 02/13/2016 in [facility] Office Visit from 12/14/2015 in [facility]  PHQ-2 Total Score  0  0  0  0  0       Assessment and Plan: Haby is a 63 year old Caucasian female who has a history of bipolar disorder, anxiety disorder was evaluated by phone today.  Patient continues to struggle with anxiety symptoms and will benefit from medication readjustment.  Plan as noted below.  Plan Bipolar disorder in remission Trileptal as prescribed  For anxiety disorder-unstable Mirtazapine 22.5 mg p.o. nightly Increase Trileptal to 900 mg p.o. daily Hydroxyzine as needed-25 mg as needed.  For insomnia-stable Ambien as prescribed  Patient encouraged to restart psychotherapy sessions.   Follow-up in clinic in 2 months or sooner if needed.  December 29 at 3: 20 PM   I have spent atleast 15 minutes non  face to face with patient today. More than 50 % of the time was spent for psychoeducation and supportive psychotherapy and care coordination. This note was generated in part or whole with voice recognition software. Voice recognition is usually quite accurate but there are transcription errors that can and very often do occur. I apologize for any typographical errors that were not detected and corrected.      Ursula Alert, MD 08/30/2019, 3:12 PM

## 2019-09-02 ENCOUNTER — Other Ambulatory Visit: Payer: Self-pay

## 2019-09-02 ENCOUNTER — Ambulatory Visit (INDEPENDENT_AMBULATORY_CARE_PROVIDER_SITE_OTHER): Admitting: Licensed Clinical Social Worker

## 2019-09-02 DIAGNOSIS — F3175 Bipolar disorder, in partial remission, most recent episode depressed: Secondary | ICD-10-CM

## 2019-09-03 NOTE — Progress Notes (Signed)
Unable to leave message on cell phone. No contact

## 2019-10-26 ENCOUNTER — Ambulatory Visit (INDEPENDENT_AMBULATORY_CARE_PROVIDER_SITE_OTHER): Admitting: Psychiatry

## 2019-10-26 ENCOUNTER — Other Ambulatory Visit: Payer: Self-pay

## 2019-10-26 ENCOUNTER — Encounter: Payer: Self-pay | Admitting: Psychiatry

## 2019-10-26 DIAGNOSIS — F411 Generalized anxiety disorder: Secondary | ICD-10-CM | POA: Diagnosis not present

## 2019-10-26 DIAGNOSIS — F5105 Insomnia due to other mental disorder: Secondary | ICD-10-CM | POA: Diagnosis not present

## 2019-10-26 DIAGNOSIS — F3175 Bipolar disorder, in partial remission, most recent episode depressed: Secondary | ICD-10-CM

## 2019-10-26 NOTE — Progress Notes (Signed)
Virtual Visit via Video Note  I connected with Jennifer [name] on 10/26/19 at  3:20 PM EST by a video enabled telemedicine application and verified that I am speaking with the correct person using two identifiers.   I discussed the limitations of evaluation and management by telemedicine and the availability of in person appointments. The patient expressed understanding and agreed to proceed.   I discussed the assessment and treatment plan with the patient. The patient was provided an opportunity to ask questions and all were answered. The patient agreed with the plan and demonstrated an understanding of the instructions.   The patient was advised to call back or seek an in-person evaluation if the symptoms worsen or if the condition fails to improve as anticipated.   [facility] MD OP Progress Note  10/26/2019 4:26 PM Jennifer [name]  MRN:  [id number]  Chief Complaint:  Chief Complaint    Follow-up     HPI: Jennifer [name] is a 63 year old Caucasian female, lives in [place], married, has a history of bipolar disorder, GAD, insomnia, neuroleptic induced dystonia currently resolved was evaluated by telemedicine today.  A video call was initiated however due to connection problem it had to be changed to a phone call.  Patient today reports she is currently struggling with a lot of situational stressors.  Her husband is currently retired and she has to deal with his behavioral issues.  She also reports that she has a new grandbaby however the baby continues to be in the hospital since the baby was only 4 pounds at birth and was preterm.  Patient reports that is stressful for her.  Patient however reports as long as she takes her medications she is able to cope with her stressors.  She is taking it one day at a time.  She reports sleep is good with the combination of medications she is on.  Patient has not been in psychotherapy sessions in a while.  Patient was encouraged to restart therapy sessions last  visit however she has been noncompliant.  Discussed this with patient again.  She reports she will think about it.  Patient denies any suicidality, homicidality or perceptual disturbances. Visit Diagnosis:    ICD-10-CM   1. Bipolar disorder, in partial remission, most recent episode depressed ([place])  F31.75   2. Generalized anxiety disorder  F41.1   3. Insomnia due to mental disorder  F51.05     Past Psychiatric History: I have reviewed past psychiatric history from my progress note on 02/05/2018.  Past Medical History:  Past Medical History:  Diagnosis Date  . Anemia 1985   bled during a cervical biopsy, led to bleed transfusion   . Anginal pain ([place])   . Anxiety   . Arthritis    pt. reports that her neck is stiff   . Bipolar depression ([place])   . Blood dyscrasia    reported hx [name] '85; saw hematologist Dr. Janese Banks 10/2016, VWD work-up WNL, not felt to have a primary bleeding disorder   . CAD (coronary artery disease)   . 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 COPD (chronic obstructive pulmonary disease) ([place])    dr. Gar Ponto [facility]    PCP  . CRD (chronic renal disease)   . Depression   . Depression with anxiety   . Diabetes mellitus   . Dyspnea    W/ EXERTION   . Enlarged liver   .  Family history of adverse reaction to anesthesia    father woke up slowly  . Fibromyalgia   . Generalized anxiety disorder   . GERD (gastroesophageal reflux disease)   . Hyperlipidemia   . Hypertension   . Hypothyroidism   . Low back pain   . Low sodium levels   . Migraines   . Neuropathy   . Neuropathy   . Persistent disorder of initiating or maintaining sleep   . Restless leg   . Von Willebrand disease ([place])     Past Surgical History:  Procedure Laterality Date  . ABDOMINAL HYSTERECTOMY    . 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 APPLICATION OF ROBOTIC ASSISTANCE FOR SPINAL PROCEDURE N/A 12/10/2016   Procedure: APPLICATION OF ROBOTIC ASSISTANCE FOR SPINAL PROCEDURE;  Surgeon: Kevan Ny Ditty, MD;  Location: [facility];  Service: Neurosurgery;  Laterality: N/A;  . BACK SURGERY  2007   lumbar  . BREAST IMPLANT REMOVAL    . CARDIAC CATHETERIZATION     CAD,PCI of LAD, Cypher stent 2.5-28mm  . CARDIAC CATHETERIZATION     PCI of mid-LAD in stent restenosis with a drug-eluting stent  . CARDIAC CATHETERIZATION  06/03/13   [facility];MID LAD 100 % STENOSIS; PRIOR STENT; MID RCA 40 % STENOSIS  . COLONOSCOPY WITH PROPOFOL N/A 08/13/2018   Procedure: COLONOSCOPY WITH PROPOFOL;  Surgeon: [name], MD;  Location: [facility] ENDOSCOPY;  Service: Gastroenterology;  Laterality: N/A;  . CORONARY ARTERY BYPASS GRAFT   08-12-2011   CABG x 3  . CORONARY STENT PLACEMENT    . ESOPHAGOGASTRODUODENOSCOPY (EGD) WITH PROPOFOL N/A 08/13/2018   Procedure: ESOPHAGOGASTRODUODENOSCOPY (EGD) WITH PROPOFOL;  Surgeon: [name], MD;  Location: [facility] ENDOSCOPY;  Service: Gastroenterology;  Laterality: N/A;  . LUMBAR FUSION     L1-S1  . LUMBAR PERCUTANEOUS PEDICLE SCREW 1 LEVEL N/A 12/10/2016   Procedure: Extension of pedicle screw fixation to LUMBAR FOUR;  Surgeon: Kevan Ny Ditty, MD;  Location: [facility];  Service: Neurosurgery;  Laterality: N/A;  . NASAL SINUS SURGERY  1997   tuirbinate reduction   . RECTAL EXAM UNDER ANESTHESIA N/A 10/06/2018   Procedure: RECTAL EXAM UNDER ANESTHESIA;  Surgeon: Jules Husbands, MD;  Location: [facility] ORS;  Service: General;  Laterality: N/A;    Family Psychiatric History: I have reviewed family psychiatric history from  my progress note on 02/05/2018.  Family History:  Family History  Problem Relation Age of Onset  . Aneurysm Father        abdominal  . Diabetes Father   . Alcohol abuse Father   . Hypertension Father   . Aneurysm Brother        abdominal  . Alcohol abuse Brother   . Hypertension Brother   . Colon cancer Brother   . Diabetes Mother   . Alcohol abuse Mother   . Hypertension Other        family hx  . Hyperlipidemia Other        family hx  . Diabetes Other        family hx    Social History: I have reviewed social history from my progress note on 02/05/2018.  Social History   Socioeconomic History  . Marital status: Married    Spouse name: Photographer  . Number of children: 3  . Years of education: Not on file  . Highest education level: Associate degree: occupational, Hotel manager, or vocational program  Occupational History  . Not on file  Tobacco Use  . Smoking status: Former Smoker    Years: 16.00    Types: Cigarettes    Quit date: 06/20/2010    Years since quitting: 9.3  . Smokeless tobacco: Never Used  Substance and Sexual Activity  . Alcohol use: No    Alcohol/week: 0.0 standard drinks  . Drug use: No  . Sexual activity: Yes  Other Topics Concern  . Not on file  Social History Narrative  . Not on file   Social Determinants of Health   Financial Resource Strain:   . Difficulty of Paying Living Expenses: Not on file  Food Insecurity:   . Worried About Charity fundraiser in the Last Year: Not on file  . Ran Out of Food in the Last Year: Not on file  Transportation Needs:   . Lack of Transportation (Medical): Not on file  . Lack of Transportation (Non-Medical): Not on file  Physical Activity:   . Days of Exercise per Week: Not on file  . Minutes of Exercise per Session: Not on file  Stress:   . Feeling of Stress : Not on file  Social Connections:   . Frequency of Communication with Friends and Family: Not on file  . Frequency of Social Gatherings with Friends  and Family: Not on file  . Attends Religious Services: Not on file  . Active Member of Clubs or Organizations: Not on file  . Attends Archivist Meetings: Not on file  . Marital Status: Not on file    Allergies: No Known Allergies  Metabolic Disorder Labs: Lab Results  Component Value Date   HGBA1C 9.4 (H) 12/05/2017   MPG 223.08 12/05/2017   MPG 197 10/30/2016   Lab Results  Component Value Date   PROLACTIN 6.1 12/05/2017   Lab Results  Component Value Date   CHOL 238 (H) 12/05/2017   TRIG 819 (H) 12/05/2017   HDL 41 12/05/2017   CHOLHDL 5.8 12/05/2017   VLDL UNABLE TO CALCULATE IF TRIGLYCERIDE OVER 400 mg/dL 12/05/2017   LDLCALC UNABLE TO CALCULATE IF TRIGLYCERIDE OVER 400 mg/dL 12/05/2017   LDLCALC SEE COMMENT 07/09/2012   Lab Results  Component Value Date   TSH 2.600 08/03/2018   TSH 86.000 (H) 12/05/2017    Therapeutic Level Labs: No results found for: LITHIUM No results found for: VALPROATE No components found for:  CBMZ  Current Medications: Current Outpatient Medications  Medication Sig Dispense Refill  . albuterol-ipratropium (COMBIVENT) 18-103 MCG/ACT inhaler Inhale 2 puffs into the lungs daily as needed for wheezing or shortness of breath.     Marland Kitchen amLODipine (NORVASC) 5 MG tablet Take 1 tablet (5 mg total) by mouth 2 (two) times daily. 180 tablet 3  . aspirin 81 MG chewable tablet Chew by mouth daily.    Marland Kitchen aspirin EC 81 MG tablet Take 1 tablet (81 mg total) by mouth daily.    Marland Kitchen atorvastatin (LIPITOR) 80 MG tablet Take 1 tablet (80 mg total) by mouth daily. 90 tablet 3  . carisoprodol (SOMA) 350 MG tablet Take 1 tablet (350 mg total) by mouth every 6 (six) hours as needed for muscle spasms. (Patient taking differently: Take 350 mg by mouth 2 (  two) times daily as needed for muscle spasms. ) 120 tablet 0  . carvedilol (COREG) 12.5 MG tablet Take 1 tablet (12.5 mg total) by mouth 2 (two) times daily with a meal. 180 tablet 3  . estradiol (ESTRACE) 0.5  MG tablet Place vaginally.    Marland Kitchen estradiol (ESTRING) 2 MG vaginal ring Place vaginally.    . fluticasone (FLOVENT HFA) 110 MCG/ACT inhaler Inhale into the lungs.    Marland Kitchen FREESTYLE LITE test strip     . gabapentin (NEURONTIN) 300 MG capsule Take 300 mg by mouth 2 (two) times daily.     Marland Kitchen HUMULIN 70/30 KWIKPEN (70-30) 100 UNIT/ML PEN Inject 10-15 Units into the skin See admin instructions. Inject 10-15 units SQ in the morning and inject 10-12 units SQ at bedtime per sliding scale    . HYDROcodone-acetaminophen (NORCO/VICODIN) 5-325 MG tablet Take 1 tablet by mouth every 6 (six) hours as needed for moderate pain. (Patient not taking: Reported on 08/02/2019) 12 tablet 0  . hydrOXYzine (ATARAX/VISTARIL) 25 MG tablet Take 0.5-1 tablets (12.5-25 mg total) by mouth 3 (three) times daily as needed for anxiety. (Patient not taking: Reported on 08/02/2019) 270 tablet 1  . Insulin Pen Needle (FIFTY50 PEN NEEDLES) 31G X 8 MM MISC 1 Each by Misc.(Non-Drug; Combo Route) route As Directed.    . Insulin Pen Needle (PEN NEEDLES 31GX5/16") 31G X 8 MM MISC 1 Each by Misc.(Non-Drug; Combo Route) route As Directed.    Marland Kitchen lisinopril (PRINIVIL,ZESTRIL) 20 MG tablet Take 1 tablet (20 mg total) by mouth daily. 90 tablet 0  . metFORMIN (GLUCOPHAGE-XR) 750 MG 24 hr tablet Take by mouth.    . mirabegron ER (MYRBETRIQ) 25 MG TB24 tablet Take by mouth.    . mirtazapine (REMERON) 15 MG tablet TAKE ONE AND ONE-HALF TABLETS AT BEDTIME 135 tablet 3  . nitroGLYCERIN (NITROSTAT) 0.4 MG SL tablet Place 1 tablet (0.4 mg total) under the tongue every 5 (five) minutes as needed for chest pain. 25 tablet 6  . omeprazole (PRILOSEC) 20 MG capsule Take 20 mg by mouth daily.     . Oxcarbazepine (TRILEPTAL) 300 MG tablet Take 3 tablets (900 mg total) by mouth at bedtime. 270 tablet 1  . oxyCODONE (OXY IR/ROXICODONE) 5 MG immediate release tablet Take 5 mg by mouth See admin instructions. Take 5 mg by mouth up to 5 times daily as needed for severe pain     . Oxycodone HCl 10 MG TABS LIMIT ONE HALF TO ONE TABLET BY MOUTH FOUR TO SIX TIMES PER DAY IF TOLERATED (NO HYDROCODONE ACETAMINOPHEN)    . Oxycodone HCl 10 MG TABS LIMIT 1 2 TO 1 (ONE HALF TO ONE) TABLET BY MOUTH 4 6 TIMES DAILY IF TOLERATED NO HYDROCODONE ACETAMINOPHEN    . oxyCODONE-acetaminophen (PERCOCET) 10-325 MG tablet Take by mouth.    . RESTASIS 0.05 % ophthalmic emulsion Place 1 drop into both eyes daily as needed (for dryness).     Marland Kitchen rOPINIRole (REQUIP) 0.5 MG tablet Take 1 mg by mouth at bedtime.     . sitaGLIPtin (JANUVIA) 100 MG tablet Take 100 mg by mouth daily.    Marland Kitchen SYNTHROID 25 MCG tablet Take 25 mcg by mouth daily before breakfast.     . zolpidem (AMBIEN) 10 MG tablet TAKE ONE-HALF (1/2) TO 1 TABLET AT BEDTIME AS NEEDED FOR SLEEP 90 tablet 0   No current facility-administered medications for this visit.     Musculoskeletal: Strength & Muscle Tone: UTA Gait & Station: Reports  as WNL Patient leans: N/A  Psychiatric Specialty Exam: Review of Systems  Psychiatric/Behavioral: The patient is nervous/anxious.   All other systems reviewed and are negative.   There were no vitals taken for this visit.There is no height or weight on file to calculate BMI.  General Appearance: UTA  Eye Contact:  Fair  Speech:  Normal Rate  Volume:  Normal  Mood:  Anxious  Affect:  UTA  Thought Process:  Goal Directed and Descriptions of Associations: Intact  Orientation:  Full (Time, Place, and Person)  Thought Content: Logical   Suicidal Thoughts:  No  Homicidal Thoughts:  No  Memory:  Immediate;   Fair Recent;   Fair Remote;   Fair  Judgement:  Fair  Insight:  Fair  Psychomotor Activity:  Normal  Concentration:  Concentration: Fair and Attention Span: Fair  Recall:  [employer] of Knowledge: Fair  Language: Fair  Akathisia:  No  Handed:  Right  AIMS (if indicated): Denies tremors, rigidity  Assets:  Communication Skills Desire for Improvement Social Support  ADL's:   Intact  Cognition: WNL  Sleep:  Fair   Screenings: AIMS     Office Visit from 12/23/2018 in [facility] Office Visit from 08/18/2018 in [facility] Total Score  5  6    PHQ2-9     Office Visit from 05/09/2016 in [facility] Procedure visit from 03/20/2016 in [facility] Office Visit from 03/12/2016 in [place] [facility] Clinical Support from 02/13/2016 in [place] Office Visit from 12/14/2015 in [facility]  PHQ-2 Total Score  0  0  0  0  0       Assessment and Plan: Issabelle is a 63 year old Caucasian female who has a history of bipolar disorder, anxiety disorder was evaluated by phone today.  Patient is currently struggling with situational stressors.  She is compliant on medications with good benefits.  Patient encouraged to restart psychotherapy sessions.  Plan as noted below.  Plan Bipolar disorder in remission Trileptal 900 mg p.o. daily   Anxiety disorder-improving Mirtazapine 22.5 mg p.o. nightly Trileptal 900 mg p.o. daily Hydroxyzine as needed-25 mg p.o. daily as needed.  Insomnia-stable Ambien as prescribed  Patient encouraged to restart psychotherapy sessions.  Follow-up in clinic in 2 months or sooner if needed.  March 2 at 2 PM  I have spent atleast 15 minutes non face to face with patient today. More than 50 % of the time was spent for psychoeducation and supportive psychotherapy and care coordination. This note was generated in part or whole with voice recognition software. Voice recognition is usually quite accurate but there are transcription errors that can and very often do occur. I apologize for any typographical errors that were not detected and corrected.       Ursula Alert,  MD 10/26/2019, 4:26 PM

## 2019-11-01 ENCOUNTER — Other Ambulatory Visit: Payer: Self-pay | Admitting: Family Medicine

## 2019-11-01 DIAGNOSIS — R059 Cough, unspecified: Secondary | ICD-10-CM

## 2019-11-01 DIAGNOSIS — R05 Cough: Secondary | ICD-10-CM

## 2019-11-29 ENCOUNTER — Telehealth: Payer: Self-pay | Admitting: Psychiatry

## 2019-11-29 DIAGNOSIS — F5105 Insomnia due to other mental disorder: Secondary | ICD-10-CM

## 2019-11-29 MED ORDER — ZOLPIDEM TARTRATE 10 MG PO TABS: ORAL_TABLET | ORAL | 0 refills | Status: DC

## 2019-11-29 NOTE — Telephone Encounter (Signed)
Sent Ambien to pharmacy 

## 2019-12-28 ENCOUNTER — Other Ambulatory Visit: Payer: Self-pay

## 2019-12-28 ENCOUNTER — Encounter: Payer: Self-pay | Admitting: Psychiatry

## 2019-12-28 ENCOUNTER — Ambulatory Visit (INDEPENDENT_AMBULATORY_CARE_PROVIDER_SITE_OTHER): Admitting: Psychiatry

## 2019-12-28 DIAGNOSIS — F5105 Insomnia due to other mental disorder: Secondary | ICD-10-CM | POA: Diagnosis not present

## 2019-12-28 DIAGNOSIS — F3176 Bipolar disorder, in full remission, most recent episode depressed: Secondary | ICD-10-CM | POA: Diagnosis not present

## 2019-12-28 DIAGNOSIS — F411 Generalized anxiety disorder: Secondary | ICD-10-CM | POA: Diagnosis not present

## 2019-12-28 NOTE — Progress Notes (Signed)
Provider Location : [facility] Patient Location : Home   Virtual Visit via Video Note  I connected with Jennifer [name] on 12/28/19 at  2:00 PM EST by a video enabled telemedicine application and verified that I am speaking with the correct person using two identifiers.   I discussed the limitations of evaluation and management by telemedicine and the availability of in person appointments. The patient expressed understanding and agreed to proceed.   I discussed the assessment and treatment plan with the patient. The patient was provided an opportunity to ask questions and all were answered. The patient agreed with the plan and demonstrated an understanding of the instructions.   The patient was advised to call back or seek an in-person evaluation if the symptoms worsen or if the condition fails to improve as anticipated.   [place] MD OP Progress Note  12/28/2019 2:22 PM Jennifer [name]  MRN:  [id number]  Chief Complaint:  Chief Complaint    Follow-up     HPI: Jennifer [name] is a 64 year old Caucasian female, lives in [place], married, has a history of bipolar disorder, GAD, insomnia, neuroleptic induced dystonia currently resolved was evaluated by telemedicine today.  A video call was attempted however due to correction problem it had to be changed to a phone call.  Patient today reports she is currently doing well on the current medication regimen.  She does have a lot of health issues going on however overall she is doing well.  Patient reports she is compliant on her medications.  She denies side effects.  Patient denies any suicidality, homicidality or perceptual disturbances.  Patient reports she is interested in restarting psychotherapy sessions again.  She agrees to give a call to make an appointment.  Patient denies any other concerns today. Visit Diagnosis:    ICD-10-CM   1. Bipolar disorder, in full remission, most recent episode depressed ([place])  F31.76   2. Generalized anxiety  disorder  F41.1   3. Insomnia due to mental condition  F51.05     Past Psychiatric History: I have reviewed past psychiatric history from my progress note on 02/05/2018.  Past Medical History:  Past Medical History:  Diagnosis Date  . Anemia 1985   bled during a cervical biopsy, led to bleed transfusion   . Anginal pain ([place])   . Anxiety   . Arthritis    pt. reports that her neck is stiff   . Bipolar depression ([place])   . Blood dyscrasia    reported hx [name] '85; saw hematologist Dr. Janese Banks 10/2016, VWD work-up WNL, not felt to have a primary bleeding disorder   . CAD (coronary artery disease)   . 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 COPD (chronic obstructive pulmonary disease) ([place])    dr. Gar Ponto [facility]    PCP  . CRD (chronic renal disease)   . Depression   . Depression with anxiety   . Diabetes mellitus   . Dyspnea    W/ EXERTION   . Enlarged liver   . Family history of adverse reaction to anesthesia    father woke up slowly  . Fibromyalgia   . Generalized anxiety disorder   . GERD (gastroesophageal reflux disease)   . Hyperlipidemia   . Hypertension   . Hypothyroidism   . Low back pain   . Low sodium levels   . Migraines   . Neuropathy   .  Neuropathy   . Persistent disorder of initiating or maintaining sleep   . Restless leg   . Von Willebrand disease ([place])     Past Surgical History:  Procedure Laterality Date  . ABDOMINAL HYSTERECTOMY    . 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 APPLICATION OF ROBOTIC ASSISTANCE  FOR SPINAL PROCEDURE N/A 12/10/2016   Procedure: APPLICATION OF ROBOTIC ASSISTANCE FOR SPINAL PROCEDURE;  Surgeon: Kevan Ny Ditty, MD;  Location: [facility];  Service: Neurosurgery;  Laterality: N/A;  . BACK SURGERY  2007   lumbar  . BREAST IMPLANT REMOVAL    . CARDIAC CATHETERIZATION     CAD,PCI of LAD, Cypher stent 2.5-28mm  . CARDIAC CATHETERIZATION     PCI of mid-LAD in stent restenosis with a drug-eluting stent  . CARDIAC CATHETERIZATION  06/03/13   [facility];MID LAD 100 % STENOSIS; PRIOR STENT; MID RCA 40 % STENOSIS  . COLONOSCOPY WITH PROPOFOL N/A 08/13/2018   Procedure: COLONOSCOPY WITH PROPOFOL;  Surgeon: [name], MD;  Location[facility] ENDOSCOPY;  Service: Gastroenterology;  Laterality: N/A;  . CORONARY ARTERY BYPASS GRAFT   08-12-2011   CABG x 3  . CORONARY STENT PLACEMENT    . ESOPHAGOGASTRODUODENOSCOPY (EGD) WITH PROPOFOL N/A 08/13/2018   Procedure: ESOPHAGOGASTRODUODENOSCOPY (EGD) WITH PROPOFOL;  Surgeon: [name], MD;  Location: [facility] ENDOSCOPY;  Service: Gastroenterology;  Laterality: N/A;  . LUMBAR FUSION     L1-S1  . LUMBAR PERCUTANEOUS PEDICLE SCREW 1 LEVEL N/A 12/10/2016   Procedure: Extension of pedicle screw fixation to LUMBAR FOUR;  Surgeon: Kevan Ny Ditty, MD;  Location: [facility];  Service: Neurosurgery;  Laterality: N/A;  . NASAL SINUS SURGERY  1997   tuirbinate reduction   . RECTAL EXAM UNDER ANESTHESIA N/A 10/06/2018   Procedure: RECTAL EXAM UNDER ANESTHESIA;  Surgeon: Jules Husbands, MD;  Location: [facility] ORS;  Service: General;  Laterality: N/A;    Family Psychiatric History: I have reviewed family psychiatric history from my progress note on 02/05/2018.  Family History:  Family History  Problem Relation Age of Onset  . Aneurysm Father        abdominal  . Diabetes Father   . Alcohol abuse Father   . Hypertension Father   . Aneurysm Brother        abdominal  . Alcohol abuse Brother   . Hypertension Brother   . Colon cancer Brother   . Diabetes Mother    . Alcohol abuse Mother   . Hypertension Other        family hx  . Hyperlipidemia Other        family hx  . Diabetes Other        family hx    Social History: I have reviewed social history from my progress note on 02/05/2018. Social History   Socioeconomic History  . Marital status: Married    Spouse name: Photographer  . Number of children: 3  . Years of education: Not on file  . Highest education level: Associate degree: occupational, Hotel manager, or vocational program  Occupational History  . Not on file  Tobacco Use  . Smoking status: Former Smoker    Years: 16.00  Types: Cigarettes    Quit date: 06/20/2010    Years since quitting: 9.5  . Smokeless tobacco: Never Used  Substance and Sexual Activity  . Alcohol use: No    Alcohol/week: 0.0 standard drinks  . Drug use: No  . Sexual activity: Yes  Other Topics Concern  . Not on file  Social History Narrative  . Not on file   Social Determinants of Health   Financial Resource Strain:   . Difficulty of Paying Living Expenses: Not on file  Food Insecurity:   . Worried About Charity fundraiser in the Last Year: Not on file  . Ran Out of Food in the Last Year: Not on file  Transportation Needs:   . Lack of Transportation (Medical): Not on file  . Lack of Transportation (Non-Medical): Not on file  Physical Activity:   . Days of Exercise per Week: Not on file  . Minutes of Exercise per Session: Not on file  Stress:   . Feeling of Stress : Not on file  Social Connections:   . Frequency of Communication with Friends and Family: Not on file  . Frequency of Social Gatherings with Friends and Family: Not on file  . Attends Religious Services: Not on file  . Active Member of Clubs or Organizations: Not on file  . Attends Archivist Meetings: Not on file  . Marital Status: Not on file    Allergies: No Known Allergies  Metabolic Disorder Labs: Lab Results  Component Value Date   HGBA1C 9.4 (H) 12/05/2017   MPG  223.08 12/05/2017   MPG 197 10/30/2016   Lab Results  Component Value Date   PROLACTIN 6.1 12/05/2017   Lab Results  Component Value Date   CHOL 238 (H) 12/05/2017   TRIG 819 (H) 12/05/2017   HDL 41 12/05/2017   CHOLHDL 5.8 12/05/2017   VLDL UNABLE TO CALCULATE IF TRIGLYCERIDE OVER 400 mg/dL 12/05/2017   LDLCALC UNABLE TO CALCULATE IF TRIGLYCERIDE OVER 400 mg/dL 12/05/2017   LDLCALC SEE COMMENT 07/09/2012   Lab Results  Component Value Date   TSH 2.600 08/03/2018   TSH 86.000 (H) 12/05/2017    Therapeutic Level Labs: No results found for: LITHIUM No results found for: VALPROATE No components found for:  CBMZ  Current Medications: Current Outpatient Medications  Medication Sig Dispense Refill  . albuterol (VENTOLIN HFA) 108 (90 Base) MCG/ACT inhaler SMARTSIG:2 Puff(s) By Mouth Every 4 Hours PRN    . albuterol-ipratropium (COMBIVENT) 18-103 MCG/ACT inhaler Inhale 2 puffs into the lungs daily as needed for wheezing or shortness of breath.     Marland Kitchen amLODipine (NORVASC) 10 MG tablet Take 10 mg by mouth daily.    Marland Kitchen aspirin 81 MG chewable tablet Chew by mouth daily.    Marland Kitchen atorvastatin (LIPITOR) 80 MG tablet Take 1 tablet (80 mg total) by mouth daily. 90 tablet 3  . benzonatate (TESSALON) 200 MG capsule     . carisoprodol (SOMA) 350 MG tablet Take 1 tablet (350 mg total) by mouth every 6 (six) hours as needed for muscle spasms. (Patient taking differently: Take 350 mg by mouth 2 (two) times daily as needed for muscle spasms. ) 120 tablet 0  . carvedilol (COREG) 12.5 MG tablet Take 1 tablet (12.5 mg total) by mouth 2 (two) times daily with a meal. 180 tablet 3  . DETROL LA 4 MG 24 hr capsule Take 4 mg by mouth daily.    Marland Kitchen estradiol (ESTRACE) 0.5 MG tablet Place vaginally.    Marland Kitchen  estradiol (ESTRING) 2 MG vaginal ring Place vaginally.    . fluticasone (FLONASE) 50 MCG/ACT nasal spray Place 1 spray into both nostrils 2 (two) times daily.    . fluticasone (FLOVENT HFA) 110 MCG/ACT inhaler  Inhale into the lungs.    Marland Kitchen FREESTYLE LITE test strip     . gabapentin (NEURONTIN) 300 MG capsule Take 300 mg by mouth 2 (two) times daily.     Marland Kitchen HUMULIN 70/30 KWIKPEN (70-30) 100 UNIT/ML PEN Inject 10-15 Units into the skin See admin instructions. Inject 10-15 units SQ in the morning and inject 10-12 units SQ at bedtime per sliding scale    . HYDROcodone-acetaminophen (NORCO/VICODIN) 5-325 MG tablet Take 1 tablet by mouth every 6 (six) hours as needed for moderate pain. 12 tablet 0  . hydrOXYzine (ATARAX/VISTARIL) 25 MG tablet Take 0.5-1 tablets (12.5-25 mg total) by mouth 3 (three) times daily as needed for anxiety. 270 tablet 1  . Insulin Pen Needle (FIFTY50 PEN NEEDLES) 31G X 8 MM MISC 1 Each by Misc.(Non-Drug; Combo Route) route As Directed.    . Insulin Pen Needle (PEN NEEDLES 31GX5/16") 31G X 8 MM MISC 1 Each by Misc.(Non-Drug; Combo Route) route As Directed.    . metFORMIN (GLUCOPHAGE-XR) 750 MG 24 hr tablet Take by mouth.    . mirabegron ER (MYRBETRIQ) 25 MG TB24 tablet Take by mouth.    . mirtazapine (REMERON) 15 MG tablet TAKE ONE AND ONE-HALF TABLETS AT BEDTIME 135 tablet 3  . nitroGLYCERIN (NITROSTAT) 0.4 MG SL tablet Place 1 tablet (0.4 mg total) under the tongue every 5 (five) minutes as needed for chest pain. 25 tablet 6  . Oxcarbazepine (TRILEPTAL) 300 MG tablet Take 3 tablets (900 mg total) by mouth at bedtime. 270 tablet 1  . Oxycodone HCl 10 MG TABS LIMIT ONE HALF TO ONE TABLET BY MOUTH FOUR TO SIX TIMES PER DAY IF TOLERATED (NO HYDROCODONE ACETAMINOPHEN)    . Oxycodone HCl 10 MG TABS LIMIT 1 2 TO 1 (ONE HALF TO ONE) TABLET BY MOUTH 4 6 TIMES DAILY IF TOLERATED NO HYDROCODONE ACETAMINOPHEN    . oxyCODONE-acetaminophen (PERCOCET) 10-325 MG tablet Take by mouth.    . RESTASIS 0.05 % ophthalmic emulsion Place 1 drop into both eyes daily as needed (for dryness).     Marland Kitchen rOPINIRole (REQUIP) 0.5 MG tablet Take 1 mg by mouth at bedtime.     Marland Kitchen SYNTHROID 25 MCG tablet Take 25 mcg by mouth  daily before breakfast.     . telmisartan (MICARDIS) 80 MG tablet Take 80 mg by mouth daily.    Marland Kitchen telmisartan (MICARDIS) 80 MG tablet Take by mouth.    . zolpidem (AMBIEN) 10 MG tablet TAKE ONE-HALF (1/2) TO 1 TABLET AT BEDTIME AS NEEDED FOR SLEEP 90 tablet 0  . doxycycline (VIBRAMYCIN) 100 MG capsule Take 100 mg by mouth 2 (two) times daily.    Marland Kitchen omeprazole (PRILOSEC) 20 MG capsule Take 20 mg by mouth daily.     Marland Kitchen oxyCODONE (OXY IR/ROXICODONE) 5 MG immediate release tablet Take 5 mg by mouth See admin instructions. Take 5 mg by mouth up to 5 times daily as needed for severe pain    . sitaGLIPtin (JANUVIA) 100 MG tablet Take 100 mg by mouth daily.     No current facility-administered medications for this visit.     Musculoskeletal: Strength & Muscle Tone: UTA Gait & Station: Reports as WNL Patient leans: N/A  Psychiatric Specialty Exam: Review of Systems  Musculoskeletal: Positive for  back pain.  Psychiatric/Behavioral: The patient is nervous/anxious.   All other systems reviewed and are negative.   There were no vitals taken for this visit.There is no height or weight on file to calculate BMI.  General Appearance: UTA  Eye Contact:  UTA  Speech:  Clear and Coherent  Volume:  Normal  Mood:  Anxious improving  Affect:  UTA  Thought Process:  Goal Directed and Descriptions of Associations: Intact  Orientation:  Full (Time, Place, and Person)  Thought Content: Logical   Suicidal Thoughts:  No  Homicidal Thoughts:  No  Memory:  Immediate;   Fair Recent;   Fair Remote;   Fair  Judgement:  Fair  Insight:  Fair  Psychomotor Activity:  UTA  Concentration:  Concentration: Fair and Attention Span: Fair  Recall:  [employer] of Knowledge: Fair  Language: Fair  Akathisia:  No  Handed:  Right  AIMS (if indicated): UTA  Assets:  Communication Skills Desire for Improvement Housing Social Support  ADL's:  Intact  Cognition: WNL  Sleep:  Fair   Screenings: AIMS     Office  Visit from 12/23/2018 in [place] Office Visit from 08/18/2018 in [facility] Total Score  5  6    PHQ2-9     Office Visit from 05/09/2016 in [facility] [place] Procedure visit from 03/20/2016 in [facility] [place] Office Visit from 03/12/2016 in [facility] Clinical Support from 02/13/2016 in [place] Office Visit from 12/14/2015 in [place]  PHQ-2 Total Score  0  0  0  0  0       Assessment and Plan: Ishanvi is a 64 year old Caucasian female who has a history of bipolar disorder, anxiety disorder was evaluated by telemedicine today.  Patient is currently making progress on the current medication regimen.  Plan as noted below.  Plan Bipolar disorder in remission Trileptal 900 mg p.o. daily  Anxiety disorder-stable Mirtazapine 22.5 mg p.o. nightly Trileptal 900 mg p.o. daily Hydroxyzine as needed-25 mg p.o. daily as needed  Insomnia-stable Ambien as prescribed  Follow-up in clinic in 2 to 3 months or sooner if needed.  Patient encouraged to restart psychotherapy sessions and agrees to give her therapist a call.  I have spent atleast 20 minutes non face to face with patient today. More than 50 % of the time was spent for ordering medications and test ,psychoeducation and supportive psychotherapy and care coordination,as well as documenting clinical information in electronic health record. This note was generated in part or whole with voice recognition software. Voice recognition is usually quite accurate but there are transcription errors that can and very often do occur. I apologize for any typographical errors that were not detected and corrected.         Ursula Alert, MD 12/28/2019, 2:22 PM

## 2019-12-31 ENCOUNTER — Encounter: Payer: Self-pay | Admitting: Emergency Medicine

## 2019-12-31 ENCOUNTER — Observation Stay
Admission: EM | Admit: 2019-12-31 | Discharge: 2020-01-02 | Disposition: A | Discharge status: Home / Self Care | Attending: Internal Medicine | Admitting: Internal Medicine

## 2019-12-31 ENCOUNTER — Emergency Department

## 2019-12-31 ENCOUNTER — Other Ambulatory Visit: Payer: Self-pay

## 2019-12-31 DIAGNOSIS — F5105 Insomnia due to other mental disorder: Secondary | ICD-10-CM | POA: Insufficient documentation

## 2019-12-31 DIAGNOSIS — F411 Generalized anxiety disorder: Secondary | ICD-10-CM | POA: Insufficient documentation

## 2019-12-31 DIAGNOSIS — F319 Bipolar disorder, unspecified: Secondary | ICD-10-CM | POA: Insufficient documentation

## 2019-12-31 DIAGNOSIS — Z79890 Hormone replacement therapy (postmenopausal): Secondary | ICD-10-CM | POA: Diagnosis not present

## 2019-12-31 DIAGNOSIS — Z794 Long term (current) use of insulin: Secondary | ICD-10-CM | POA: Diagnosis not present

## 2019-12-31 DIAGNOSIS — F419 Anxiety disorder, unspecified: Secondary | ICD-10-CM | POA: Diagnosis present

## 2019-12-31 DIAGNOSIS — F329 Major depressive disorder, single episode, unspecified: Secondary | ICD-10-CM | POA: Diagnosis present

## 2019-12-31 DIAGNOSIS — E1122 Type 2 diabetes mellitus with diabetic chronic kidney disease: Secondary | ICD-10-CM | POA: Diagnosis not present

## 2019-12-31 DIAGNOSIS — M797 Fibromyalgia: Secondary | ICD-10-CM | POA: Diagnosis not present

## 2019-12-31 DIAGNOSIS — E871 Hypo-osmolality and hyponatremia: Secondary | ICD-10-CM | POA: Diagnosis present

## 2019-12-31 DIAGNOSIS — D680 Von Willebrand's disease: Secondary | ICD-10-CM | POA: Insufficient documentation

## 2019-12-31 DIAGNOSIS — I129 Hypertensive chronic kidney disease with stage 1 through stage 4 chronic kidney disease, or unspecified chronic kidney disease: Secondary | ICD-10-CM | POA: Diagnosis not present

## 2019-12-31 DIAGNOSIS — I2510 Atherosclerotic heart disease of native coronary artery without angina pectoris: Secondary | ICD-10-CM | POA: Diagnosis not present

## 2019-12-31 DIAGNOSIS — Z85828 Personal history of other malignant neoplasm of skin: Secondary | ICD-10-CM | POA: Insufficient documentation

## 2019-12-31 DIAGNOSIS — Z20822 Contact with and (suspected) exposure to covid-19: Secondary | ICD-10-CM | POA: Insufficient documentation

## 2019-12-31 DIAGNOSIS — G8929 Other chronic pain: Secondary | ICD-10-CM | POA: Insufficient documentation

## 2019-12-31 DIAGNOSIS — K219 Gastro-esophageal reflux disease without esophagitis: Secondary | ICD-10-CM | POA: Diagnosis not present

## 2019-12-31 DIAGNOSIS — Z7982 Long term (current) use of aspirin: Secondary | ICD-10-CM | POA: Diagnosis not present

## 2019-12-31 DIAGNOSIS — E1140 Type 2 diabetes mellitus with diabetic neuropathy, unspecified: Secondary | ICD-10-CM | POA: Diagnosis not present

## 2019-12-31 DIAGNOSIS — R079 Chest pain, unspecified: Secondary | ICD-10-CM | POA: Diagnosis not present

## 2019-12-31 DIAGNOSIS — E039 Hypothyroidism, unspecified: Secondary | ICD-10-CM | POA: Insufficient documentation

## 2019-12-31 DIAGNOSIS — Z79899 Other long term (current) drug therapy: Secondary | ICD-10-CM | POA: Diagnosis not present

## 2019-12-31 DIAGNOSIS — J449 Chronic obstructive pulmonary disease, unspecified: Secondary | ICD-10-CM | POA: Diagnosis not present

## 2019-12-31 DIAGNOSIS — N189 Chronic kidney disease, unspecified: Secondary | ICD-10-CM | POA: Diagnosis not present

## 2019-12-31 DIAGNOSIS — R531 Weakness: Secondary | ICD-10-CM

## 2019-12-31 DIAGNOSIS — Z951 Presence of aortocoronary bypass graft: Secondary | ICD-10-CM | POA: Insufficient documentation

## 2019-12-31 DIAGNOSIS — M549 Dorsalgia, unspecified: Secondary | ICD-10-CM | POA: Diagnosis not present

## 2019-12-31 DIAGNOSIS — E782 Mixed hyperlipidemia: Secondary | ICD-10-CM | POA: Diagnosis not present

## 2019-12-31 DIAGNOSIS — E1169 Type 2 diabetes mellitus with other specified complication: Secondary | ICD-10-CM

## 2019-12-31 DIAGNOSIS — Z7951 Long term (current) use of inhaled steroids: Secondary | ICD-10-CM | POA: Insufficient documentation

## 2019-12-31 DIAGNOSIS — E785 Hyperlipidemia, unspecified: Secondary | ICD-10-CM

## 2019-12-31 DIAGNOSIS — G2581 Restless legs syndrome: Secondary | ICD-10-CM | POA: Insufficient documentation

## 2019-12-31 DIAGNOSIS — I10 Essential (primary) hypertension: Secondary | ICD-10-CM | POA: Diagnosis present

## 2019-12-31 DIAGNOSIS — Z87891 Personal history of nicotine dependence: Secondary | ICD-10-CM | POA: Insufficient documentation

## 2019-12-31 LAB — TROPONIN I (HIGH SENSITIVITY)
Troponin I (High Sensitivity): 5 ng/L (ref <18)
Troponin I (High Sensitivity): 4 ng/L (ref <18)

## 2019-12-31 LAB — BASIC METABOLIC PANEL
Anion gap: 11 (ref 5–15)
BUN: 12 mg/dL (ref 8–23)
CO2: 24 mmol/L (ref 22–32)
Calcium: 10 mg/dL (ref 8.9–10.3)
Chloride: 89 mmol/L — ABNORMAL LOW (ref 98–111)
Creatinine, Ser: 0.55 mg/dL (ref 0.44–1.00)
GFR calc Af Amer: >60 mL/min (ref >60)
GFR calc non Af Amer: >60 mL/min (ref >60)
Glucose, Bld: 167 mg/dL — ABNORMAL HIGH (ref 70–99)
Potassium: 4.5 mmol/L (ref 3.5–5.1)
Sodium: 124 mmol/L — ABNORMAL LOW (ref 135–145)

## 2019-12-31 LAB — CBC
HCT: 41.8 % (ref 36.0–46.0)
Hemoglobin: 14.3 g/dL (ref 12.0–15.0)
MCH: 28.4 pg (ref 26.0–34.0)
MCHC: 34.2 g/dL (ref 30.0–36.0)
MCV: 82.9 fL (ref 80.0–100.0)
Platelets: 321 10*3/uL (ref 150–400)
RBC: 5.04 MIL/uL (ref 3.87–5.11)
RDW: 12.9 % (ref 11.5–15.5)
WBC: 6.1 10*3/uL (ref 4.0–10.5)
nRBC: 0 % (ref 0.0–0.2)

## 2019-12-31 LAB — GLUCOSE, CAPILLARY
Glucose-Capillary: 154 mg/dL — ABNORMAL HIGH (ref 70–99)
Glucose-Capillary: 92 mg/dL (ref 70–99)

## 2019-12-31 MED ORDER — GABAPENTIN 300 MG PO CAPS
300.0000 mg | ORAL_CAPSULE | Freq: Two times a day (BID) | ORAL | Status: DC
  Administered 2019-12-31 – 2020-01-02 (×4): 300 mg via ORAL
  Filled 2019-12-31 (×4): qty 1

## 2019-12-31 MED ORDER — FLUTICASONE PROPIONATE 50 MCG/ACT NA SUSP
1.0000 | Freq: Two times a day (BID) | NASAL | Status: DC
  Administered 2019-12-31 – 2020-01-02 (×5): 1 via NASAL
  Filled 2019-12-31: qty 16

## 2019-12-31 MED ORDER — ASPIRIN EC 81 MG PO TBEC
81.0000 mg | DELAYED_RELEASE_TABLET | Freq: Every day | ORAL | Status: DC
  Administered 2019-12-31 – 2020-01-02 (×3): 81 mg via ORAL
  Filled 2019-12-31 (×3): qty 1

## 2019-12-31 MED ORDER — ATORVASTATIN CALCIUM 80 MG PO TABS
80.0000 mg | ORAL_TABLET | Freq: Every evening | ORAL | Status: DC
  Administered 2019-12-31 – 2020-01-01 (×2): 80 mg via ORAL
  Filled 2019-12-31 (×2): qty 1

## 2019-12-31 MED ORDER — ROPINIROLE HCL 1 MG PO TABS
1.0000 mg | ORAL_TABLET | Freq: Every day | ORAL | Status: DC
  Administered 2019-12-31 – 2020-01-01 (×2): 1 mg via ORAL
  Filled 2019-12-31 (×2): qty 1

## 2019-12-31 MED ORDER — ENOXAPARIN SODIUM 40 MG/0.4ML ~~LOC~~ SOLN
40.0000 mg | SUBCUTANEOUS | Status: DC
  Administered 2019-12-31 – 2020-01-01 (×2): 40 mg via SUBCUTANEOUS
  Filled 2019-12-31 (×2): qty 0.4

## 2019-12-31 MED ORDER — FLUTICASONE PROPIONATE HFA 110 MCG/ACT IN AERO
1.0000 | INHALATION_SPRAY | Freq: Two times a day (BID) | RESPIRATORY_TRACT | Status: DC
  Administered 2020-01-01 – 2020-01-02 (×2): 1 via RESPIRATORY_TRACT
  Filled 2019-12-31: qty 12

## 2019-12-31 MED ORDER — OXCARBAZEPINE 300 MG PO TABS
900.0000 mg | ORAL_TABLET | Freq: Every day | ORAL | Status: DC
  Administered 2019-12-31 – 2020-01-01 (×2): 900 mg via ORAL
  Filled 2019-12-31 (×3): qty 3

## 2019-12-31 MED ORDER — DOXYCYCLINE HYCLATE 100 MG PO CAPS: 100.0000 mg | ORAL_CAPSULE | Freq: Two times a day (BID) | ORAL | Status: DC

## 2019-12-31 MED ORDER — HYDROCODONE-ACETAMINOPHEN 5-325 MG PO TABS: 1.0000 | ORAL_TABLET | Freq: Four times a day (QID) | ORAL | Status: DC | PRN

## 2019-12-31 MED ORDER — OXYCODONE HCL 5 MG PO TABS
5.0000 mg | ORAL_TABLET | Freq: Four times a day (QID) | ORAL | Status: DC | PRN
  Administered 2019-12-31 – 2020-01-01 (×3): 5 mg via ORAL
  Filled 2019-12-31 (×3): qty 1

## 2019-12-31 MED ORDER — LEVOTHYROXINE SODIUM 25 MCG PO TABS
25.0000 ug | ORAL_TABLET | Freq: Every day | ORAL | Status: DC
  Administered 2020-01-01 – 2020-01-02 (×2): 25 ug via ORAL
  Filled 2019-12-31 (×2): qty 1

## 2019-12-31 MED ORDER — CYCLOSPORINE 0.05 % OP EMUL
1.0000 [drp] | Freq: Every day | OPHTHALMIC | Status: DC | PRN
  Filled 2019-12-31: qty 1

## 2019-12-31 MED ORDER — CARVEDILOL 12.5 MG PO TABS
12.5000 mg | ORAL_TABLET | Freq: Two times a day (BID) | ORAL | Status: DC
  Administered 2019-12-31 – 2020-01-02 (×4): 12.5 mg via ORAL
  Filled 2019-12-31: qty 2
  Filled 2019-12-31 (×3): qty 1

## 2019-12-31 MED ORDER — ZOLPIDEM TARTRATE 5 MG PO TABS
5.0000 mg | ORAL_TABLET | Freq: Every evening | ORAL | Status: DC | PRN
  Administered 2019-12-31 – 2020-01-01 (×2): 5 mg via ORAL
  Filled 2019-12-31 (×2): qty 1

## 2019-12-31 MED ORDER — PANTOPRAZOLE SODIUM 40 MG PO TBEC
40.0000 mg | DELAYED_RELEASE_TABLET | Freq: Every day | ORAL | Status: DC
  Administered 2019-12-31 – 2020-01-02 (×3): 40 mg via ORAL
  Filled 2019-12-31 (×3): qty 1

## 2019-12-31 MED ORDER — HYDROXYZINE HCL 25 MG PO TABS: 12.5000 mg | ORAL_TABLET | Freq: Three times a day (TID) | ORAL | Status: DC | PRN

## 2019-12-31 MED ORDER — LABETALOL HCL 5 MG/ML IV SOLN: 10.0000 mg | Freq: Four times a day (QID) | INTRAVENOUS | Status: DC | PRN

## 2019-12-31 MED ORDER — SODIUM CHLORIDE 0.9% FLUSH: 3.0000 mL | Freq: Once | INTRAVENOUS | Status: DC

## 2019-12-31 MED ORDER — INSULIN ASPART 100 UNIT/ML ~~LOC~~ SOLN
4.0000 [IU] | Freq: Three times a day (TID) | SUBCUTANEOUS | Status: DC
  Administered 2019-12-31 – 2020-01-01 (×3): 4 [IU] via SUBCUTANEOUS
  Filled 2019-12-31 (×3): qty 1

## 2019-12-31 MED ORDER — METFORMIN HCL ER 750 MG PO TB24
750.0000 mg | ORAL_TABLET | Freq: Two times a day (BID) | ORAL | Status: DC
  Administered 2019-12-31 – 2020-01-01 (×2): 750 mg via ORAL
  Filled 2019-12-31 (×4): qty 1

## 2019-12-31 MED ORDER — AMLODIPINE BESYLATE 10 MG PO TABS
10.0000 mg | ORAL_TABLET | Freq: Every day | ORAL | Status: DC
  Administered 2019-12-31 – 2020-01-02 (×3): 10 mg via ORAL
  Filled 2019-12-31: qty 1
  Filled 2019-12-31: qty 2
  Filled 2019-12-31: qty 1

## 2019-12-31 MED ORDER — NITROGLYCERIN 0.4 MG SL SUBL: 0.4000 mg | SUBLINGUAL_TABLET | SUBLINGUAL | Status: DC | PRN

## 2019-12-31 MED ORDER — ASPIRIN 81 MG PO CHEW: 81.0000 mg | CHEWABLE_TABLET | Freq: Every day | ORAL | Status: DC

## 2019-12-31 MED ORDER — FESOTERODINE FUMARATE ER 4 MG PO TB24
4.0000 mg | ORAL_TABLET | Freq: Every day | ORAL | Status: DC
  Administered 2019-12-31 – 2020-01-02 (×3): 4 mg via ORAL
  Filled 2019-12-31 (×3): qty 1

## 2019-12-31 MED ORDER — INSULIN ASPART 100 UNIT/ML ~~LOC~~ SOLN
0.0000 [IU] | Freq: Three times a day (TID) | SUBCUTANEOUS | Status: DC
  Administered 2019-12-31 – 2020-01-01 (×2): 3 [IU] via SUBCUTANEOUS
  Administered 2020-01-01 – 2020-01-02 (×2): 2 [IU] via SUBCUTANEOUS
  Filled 2019-12-31 (×4): qty 1

## 2019-12-31 MED ORDER — SODIUM CHLORIDE 0.9 % IV SOLN: INTRAVENOUS | Status: DC

## 2019-12-31 NOTE — H&P (Signed)
History and Physical    Jennifer [name] [id number] DOB: Nov 03, 1955 DOA: 12/31/2019  PCP: [facility]   Patient coming from: Home  I have personally briefly reviewed patient's old medical records in [place]  Chief Complaint: Chest pain  HPI: Jennifer [name] is a 64 y.o. female with medical history significant for insulin-dependent diabetes mellitus, coronary artery disease, hypertension and COPD who presented to the emergency room for evaluation of left-sided chest pain with radiation to her left arm and back.  It has been intermittent and recurring and not associated with rest or exertion.  It was associated with one episode of emesis at home. she also notes that her blood pressure was significantly elevated as well.  Denies having any shortness of breath, vomiting, palpitations or diaphoresis.  She denies having any changes in her bowel habits.  She denies having any frequency of urination dysuria or nocturia.  Currently she is chest pain-free.  She admits to recent stressors at home she was noted to have a sodium level of 124 which is lower than her baseline of 127.  Her blood pressure was also significantly elevated with systolic blood pressure [id number]. She will be referred to observation status for further evaluation  ED Course:Patient presents with chest pain, EKG and troponin are reassuring however patient is significantly hypertensive.  Additionally on lab work she was found to have hyponatremia of 124.  She is not currently having any chest pain.  However given above findings will consult hospitalist for admission  Review of Systems: As per HPI otherwise 10 point review of systems negative.   Past Medical History:  Diagnosis Date  . Anemia 1985   bled during a cervical biopsy, led to bleed transfusion   . Anginal pain ([place])   . Anxiety   . Arthritis    pt. reports that her neck is stiff   . Bipolar depression ([place])   . Blood dyscrasia    reported hx [name] '85; saw hematologist Dr. Janese Banks 10/2016, VWD work-up WNL, not felt to have a primary bleeding disorder   . CAD (coronary artery disease)   . 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 COPD (chronic obstructive pulmonary disease) ([place])    dr. Gar Ponto [facility]    PCP  . CRD (chronic renal disease)   . Depression   . Depression with anxiety   . Diabetes mellitus   . Dyspnea    W/ EXERTION   . Enlarged liver   . Family history of adverse reaction to anesthesia    father woke up slowly  . Fibromyalgia   . Generalized anxiety disorder   . GERD (gastroesophageal reflux disease)   . Hyperlipidemia   . Hypertension   . Hypothyroidism   . Low back pain   . Low sodium levels   . Migraines   . Neuropathy   . Neuropathy   . Persistent disorder of initiating or maintaining sleep   . Restless leg   . Von Willebrand disease ([place])     Past Surgical History:  Procedure Laterality Date  . ABDOMINAL HYSTERECTOMY    . ANTERIOR LAT LUMBAR FUSION N/A 12/10/2016   Procedure: LUMBAR FOUR-FIVE  Anterior lateral lumbar interbody fusion with LUMBAR FOUR-FIVE  Posterolateral fusion/Re-operative laminectomy LUMBAR FIVE-SACRUM ONE Bilateral, Laminectomy at LUMBAR FOUR-FIVE , 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  and Left LUMBAR FOUR-FIVE  with  [place];  Surgeon: Kevan Ny Ditty, MD;  Location: [place];  Service: Neurosurgery;  Laterality: N/A;  . APPLICATION OF ROBOTIC ASSISTANCE FOR SPINAL PROCEDURE N/A 12/10/2016   Procedure: APPLICATION OF ROBOTIC ASSISTANCE FOR SPINAL PROCEDURE;  Surgeon: Kevan Ny Ditty, MD;  Location: [place];  Service: Neurosurgery;  Laterality: N/A;  . BACK SURGERY  2007   lumbar  . BREAST IMPLANT REMOVAL    . CARDIAC CATHETERIZATION     CAD,PCI of LAD, Cypher stent 2.5-28mm  . CARDIAC CATHETERIZATION     PCI of mid-LAD in stent restenosis with a drug-eluting stent  . CARDIAC CATHETERIZATION  06/03/13   [facility];MID  LAD 100 % STENOSIS; PRIOR STENT; MID RCA 40 % STENOSIS  . COLONOSCOPY WITH PROPOFOL N/A 08/13/2018   Procedure: COLONOSCOPY WITH PROPOFOL;  Surgeon: [name], MD;  Location: [facility] ENDOSCOPY;  Service: Gastroenterology;  Laterality: N/A;  . CORONARY ARTERY BYPASS GRAFT   08-12-2011   CABG x 3  . CORONARY STENT PLACEMENT    . ESOPHAGOGASTRODUODENOSCOPY (EGD) WITH PROPOFOL N/A 08/13/2018   Procedure: ESOPHAGOGASTRODUODENOSCOPY (EGD) WITH PROPOFOL;  Surgeon: [name], MD;  Location: [facility] ENDOSCOPY;  Service: Gastroenterology;  Laterality: N/A;  . LUMBAR FUSION     L1-S1  . LUMBAR PERCUTANEOUS PEDICLE SCREW 1 LEVEL N/A 12/10/2016   Procedure: Extension of pedicle screw fixation to LUMBAR FOUR;  Surgeon: Kevan Ny Ditty, MD;  Location: [place];  Service: Neurosurgery;  Laterality: N/A;  . NASAL SINUS SURGERY  1997   tuirbinate reduction   . RECTAL EXAM UNDER ANESTHESIA N/A 10/06/2018   Procedure: RECTAL EXAM UNDER ANESTHESIA;  Surgeon: Jules Husbands, MD;  Location: [facility] ORS;  Service: General;  Laterality: N/A;     reports that she quit smoking about 9 years ago. Her smoking use included cigarettes. She quit after 16.00 years of use. She has never used smokeless tobacco. She reports that she does not drink alcohol or use drugs.  No Known Allergies  Family History  Problem Relation Age of Onset  . Aneurysm Father        abdominal  . Diabetes Father   . Alcohol abuse Father   . Hypertension Father   . Aneurysm Brother        abdominal  . Alcohol abuse Brother   . Hypertension Brother   . Colon cancer Brother   . Diabetes Mother   . Alcohol abuse Mother   . Hypertension Other        family hx  . Hyperlipidemia Other        family hx  . Diabetes Other        family hx     Prior to Admission medications   Medication Sig Start Date End Date Taking? Authorizing Provider  albuterol (VENTOLIN HFA) 108 (90 Base) MCG/ACT inhaler SMARTSIG:2 Puff(s) By Mouth Every 4 Hours PRN  11/03/19   [provider]  albuterol-ipratropium (COMBIVENT) 18-103 MCG/ACT inhaler Inhale 2 puffs into the lungs daily as needed for wheezing or shortness of breath.     [provider]  amLODipine (NORVASC) 10 MG tablet Take 10 mg by mouth daily. 12/21/19   [provider]  aspirin 81 MG chewable tablet Chew by mouth daily.    [provider]  atorvastatin (LIPITOR) 80 MG tablet Take 1 tablet (80 mg total) by mouth daily. 01/27/19   Minna Merritts, MD  benzonatate (TESSALON) 200 MG capsule  11/11/19   [provider]  carisoprodol (SOMA) 350 MG tablet Take  1 tablet (350 mg total) by mouth every 6 (six) hours as needed for muscle spasms. Patient taking differently: Take 350 mg by mouth 2 (two) times daily as needed for muscle spasms.  12/12/16   Ditty, Kevan Ny, MD  carvedilol (COREG) 12.5 MG tablet Take 1 tablet (12.5 mg total) by mouth 2 (two) times daily with a meal. 01/27/19   Gollan, Kathlene November, MD  DETROL LA 4 MG 24 hr capsule Take 4 mg by mouth daily. 12/17/19   [provider]  doxycycline (VIBRAMYCIN) 100 MG capsule Take 100 mg by mouth 2 (two) times daily. 11/01/19   [provider]  estradiol (ESTRACE) 0.5 MG tablet Place vaginally.    [provider]  estradiol (ESTRING) 2 MG vaginal ring Place vaginally. 03/04/19 03/03/20  [provider]  fluticasone (FLONASE) 50 MCG/ACT nasal spray Place 1 spray into both nostrils 2 (two) times daily. 11/05/19   [provider]  fluticasone (FLOVENT HFA) 110 MCG/ACT inhaler Inhale into the lungs. 12/30/17   [provider]  FREESTYLE LITE test strip  10/07/18   [provider]  gabapentin (NEURONTIN) 300 MG capsule Take 300 mg by mouth 2 (two) times daily.  06/10/19 06/09/20  [provider]  HUMULIN 70/30 KWIKPEN (70-30) 100 UNIT/ML PEN Inject 10-15 Units into the skin See admin instructions. Inject 10-15 units SQ in the morning and inject 10-12  units SQ at bedtime per sliding scale 05/07/16   [provider]  HYDROcodone-acetaminophen (NORCO/VICODIN) 5-325 MG tablet Take 1 tablet by mouth every 6 (six) hours as needed for moderate pain. 10/06/18   Pabon, Marjory Lies, MD  hydrOXYzine (ATARAX/VISTARIL) 25 MG tablet Take 0.5-1 tablets (12.5-25 mg total) by mouth 3 (three) times daily as needed for anxiety. 03/03/19   Ursula Alert, MD  Insulin Pen Needle (FIFTY50 PEN NEEDLES) 31G X 8 MM MISC 1 Each by Misc.(Non-Drug; Combo Route) route As Directed. 02/17/12   [provider]  Insulin Pen Needle (PEN NEEDLES 31GX5/16") 31G X 8 MM MISC 1 Each by Misc.(Non-Drug; Combo Route) route As Directed. 02/17/12   [provider]  metFORMIN (GLUCOPHAGE-XR) 750 MG 24 hr tablet Take by mouth. 10/07/18   [provider]  mirabegron ER (MYRBETRIQ) 25 MG TB24 tablet Take by mouth. 07/09/18   [provider]  mirtazapine (REMERON) 15 MG tablet TAKE ONE AND ONE-HALF TABLETS AT BEDTIME 08/06/19   Ursula Alert, MD  nitroGLYCERIN (NITROSTAT) 0.4 MG SL tablet Place 1 tablet (0.4 mg total) under the tongue every 5 (five) minutes as needed for chest pain. 08/18/17   Minna Merritts, MD  omeprazole (PRILOSEC) 20 MG capsule Take 20 mg by mouth daily.     [provider]  Oxcarbazepine (TRILEPTAL) 300 MG tablet Take 3 tablets (900 mg total) by mouth at bedtime. 08/30/19   Ursula Alert, MD  oxyCODONE (OXY IR/ROXICODONE) 5 MG immediate release tablet Take 5 mg by mouth See admin instructions. Take 5 mg by mouth up to 5 times daily as needed for severe pain 06/22/18   [provider]  Oxycodone HCl 10 MG TABS LIMIT ONE HALF TO ONE TABLET BY MOUTH FOUR TO SIX TIMES PER DAY IF TOLERATED (NO HYDROCODONE ACETAMINOPHEN) 04/27/19   [provider]  Oxycodone HCl 10 MG TABS LIMIT 1 2 TO 1 (ONE HALF TO ONE) TABLET BY MOUTH 4 6 TIMES DAILY IF TOLERATED NO HYDROCODONE ACETAMINOPHEN 06/02/19   [provider]    oxyCODONE-acetaminophen (PERCOCET) 10-325 MG tablet Take  by mouth.    [provider]  RESTASIS 0.05 % ophthalmic emulsion Place 1 drop into both eyes daily as needed (for dryness).  09/16/15   [provider]  rOPINIRole (REQUIP) 0.5 MG tablet Take 1 mg by mouth at bedtime.  03/06/18   [provider]  sitaGLIPtin (JANUVIA) 100 MG tablet Take 100 mg by mouth daily. 09/24/16 08/02/19  [provider]  SYNTHROID 25 MCG tablet Take 25 mcg by mouth daily before breakfast.  03/08/18   [provider]  telmisartan (MICARDIS) 80 MG tablet Take 80 mg by mouth daily. 11/05/19   [provider]  telmisartan (MICARDIS) 80 MG tablet Take by mouth. 11/05/19 11/04/20  [provider]  zolpidem (AMBIEN) 10 MG tablet TAKE ONE-HALF (1/2) TO 1 TABLET AT BEDTIME AS NEEDED FOR SLEEP 11/29/19   Ursula Alert, MD    Physical Exam: Vitals:   12/31/19 1148 12/31/19 1245 12/31/19 1245 12/31/19 1315  BP: (!) 182/96  (!) 174/90   Pulse: 79 69  70  Resp: 18 16  15   Temp: 99.4 F (37.4 C)     TempSrc: Oral     SpO2: 96% 98% 98% 98%  Weight:      Height:         Vitals:   12/31/19 1148 12/31/19 1245 12/31/19 1245 12/31/19 1315  BP: (!) 182/96  (!) 174/90   Pulse: 79 69  70  Resp: 18 16  15   Temp: 99.4 F (37.4 C)     TempSrc: Oral     SpO2: 96% 98% 98% 98%  Weight:      Height:        Constitutional: NAD, alert and oriented to person place and time. Eyes: PERRL, lids and conjunctivae normal ENMT: Mucous membranes are moist.  Neck: normal, supple, no masses, no thyromegaly Respiratory: clear to auscultation bilaterally, no wheezing, no crackles. Normal respiratory effort. No accessory muscle use.  Cardiovascular: Regular rate and rhythm, no murmurs / rubs / gallops. No extremity edema. 2+ pedal pulses. No carotid bruits.  Chest pain is nonreproducible Abdomen: no tenderness, no masses palpated. No hepatosplenomegaly. Bowel sounds positive.   Musculoskeletal: no clubbing / cyanosis. No joint deformity upper and lower extremities.  Skin: no rashes, lesions, ulcers.  Neurologic: No gross focal neurologic deficit. Psychiatric: Normal mood and affect.   Labs on Admission: I have personally reviewed following labs and imaging studies  CBC: Recent Labs  Lab 12/31/19 1145  WBC 6.1  HGB 14.3  HCT 41.8  MCV 82.9  PLT [id number]   Basic Metabolic Panel: Recent Labs  Lab 12/31/19 1145  NA 124*  K 4.5  CL 89*  CO2 24  GLUCOSE 167*  BUN 12  CREATININE 0.55  CALCIUM 10.0   GFR: Estimated Creatinine Clearance: 68.2 mL/min (by C-G formula based on SCr of 0.55 mg/dL). Liver Function Tests: No results for input(s): AST, ALT, ALKPHOS, BILITOT, PROT, ALBUMIN in the last 168 hours. No results for input(s): LIPASE, AMYLASE in the last 168 hours. No results for input(s): AMMONIA in the last 168 hours. Coagulation Profile: No results for input(s): INR, PROTIME in the last 168 hours. Cardiac Enzymes: No results for input(s): CKTOTAL, CKMB, CKMBINDEX, TROPONINI in the last 168 hours. BNP (last 3 results) No results for input(s): PROBNP in the last 8760 hours. HbA1C: No results for input(s): HGBA1C in the last 72 hours. CBG: No results for input(s): GLUCAP in the last 168 hours. Lipid Profile: No results for input(s): CHOL,  HDL, LDLCALC, TRIG, CHOLHDL, LDLDIRECT in the last 72 hours. Thyroid Function Tests: No results for input(s): TSH, T4TOTAL, FREET4, T3FREE, THYROIDAB in the last 72 hours. Anemia Panel: No results for input(s): VITAMINB12, FOLATE, FERRITIN, TIBC, IRON, RETICCTPCT in the last 72 hours. Urine analysis:    Component Value Date/Time   COLORURINE Yellow 12/09/2013 0913   APPEARANCEUR Clear 12/09/2013 0913   LABSPEC 1.028 12/09/2013 0913   PHURINE 5.0 12/09/2013 0913   GLUCOSEU Negative 12/09/2013 0913   HGBUR Negative 12/09/2013 0913   BILIRUBINUR Negative 12/09/2013 0913   KETONESUR Negative 12/09/2013 0913    PROTEINUR >=500 12/09/2013 0913   NITRITE Negative 12/09/2013 0913   LEUKOCYTESUR Negative 12/09/2013 0913    Radiological Exams on Admission: DG Chest 2 View  Result Date: 12/31/2019 CLINICAL DATA:  Chest pain EXAM: CHEST - 2 VIEW COMPARISON:  07/04/2018 FINDINGS: Post CABG changes. The heart size and mediastinal contours are within normal limits. No focal airspace consolidation, pleural effusion, or pneumothorax. The visualized skeletal structures are unremarkable. IMPRESSION: No active cardiopulmonary disease. Electronically Signed   By: Davina Poke D.O.   On: 12/31/2019 12:34    EKG: Independently reviewed.  Sinus rhythm.  Nonspecific ST changes  Assessment/Plan Principal Problem:   CHEST PAIN UNSPECIFIED Active Problems:   CAD (coronary artery disease)   HTN (hypertension)   Anxiety and depression   Diabetes mellitus (HCC)   Hyponatremia   Chest pain    Chest pain In a patient with known coronary artery disease and risk factors which include diabetes mellitus and hypertension Obtain serial cardiac enzymes to rule out an acute coronary syndrome Continue aspirin, beta blockers and statins   Hypertensive urgency Unclear etiology Resume oral antihypertensive medications, patient is on amlodipine and carvedilol We will adjust medication doses to optimize blood pressure control As needed labetalol for systolic blood pressure greater than 160   Hyponatremia Chronic Baseline serum sodium is about 127 and it was 124 today on admission Could be related to antidepressants Hold Remeron for now Gentle IV fluid hydration      DVT prophylaxis: Lovenox Code Status: Full code Family Communication: Plan of care was discussed with patient at the bedside Disposition Plan: Back to previous home environment Consults called: None    Amarie Tarte MD [facility]     12/31/2019, 2:42 PM

## 2019-12-31 NOTE — ED Provider Notes (Signed)
[facility] Emergency Department Provider Note   ____________________________________________    I have reviewed the triage vital signs and the nursing notes.   HISTORY  Chief Complaint Hypertension and Chest Pain     HPI Jennifer [name] is a 64 y.o. female with history as noted below including hypertension, diabetes, CAD, COPD who presents with complaints of chest pain and high blood pressure.  Patient reports last night she was having some left-sided chest pain with radiation to her left arm.  This is been intermittent and reoccurred this morning.  She checked her blood pressure and found it to be high as well.  She also reports that she has been feeling weak recently with low energy.  She denies fevers or chills.  No cough.  No nausea or vomiting.  No diaphoresis.  Past Medical History:  Diagnosis Date  . Anemia 1985   bled during a cervical biopsy, led to bleed transfusion   . Anginal pain ([place])   . Anxiety   . Arthritis    pt. reports that her neck is stiff   . Bipolar depression ([place])   . Blood dyscrasia    reported hx [name] '85; saw hematologist Dr. Janese Banks 10/2016, VWD work-up WNL, not felt to have a primary bleeding disorder   . CAD (coronary artery disease)   . 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 COPD (chronic obstructive pulmonary disease) ([place])    dr. Gar Ponto [facility]    PCP  . CRD (chronic renal disease)   . Depression   . Depression with anxiety   . Diabetes mellitus   . Dyspnea    W/ EXERTION   . Enlarged liver   . Family history of adverse reaction to anesthesia    father woke up slowly  . Fibromyalgia   . Generalized anxiety disorder   . GERD (gastroesophageal reflux disease)   . Hyperlipidemia   . Hypertension   . Hypothyroidism   . Low back pain   . Low sodium levels   . Migraines   . Neuropathy   . Neuropathy   . Persistent disorder of initiating or maintaining  sleep   . Restless leg   . Von Willebrand disease [facility])     Patient Active Problem List   Diagnosis Date Noted  . Bipolar I disorder, most recent episode depressed ([place]) 05/05/2019  . Neuroleptic induced acute dystonia 05/05/2019  . Insomnia due to mental condition 05/05/2019  . Other specified chronic obstructive airways disease 03/04/2019  . Rectal polyp   . Hyponatremia 07/04/2018  . Nausea vomiting and diarrhea 07/04/2018  . Lumbosacral spondylosis with radiculopathy 12/10/2016  . Complex regional pain syndrome of both lower extremities 06/03/2016  . Spinal stenosis, lumbar region, with neurogenic claudication 05/29/2015  . Lumbar post-laminectomy syndrome 04/20/2015  . Facet syndrome, lumbar 04/20/2015  . Lumbar radiculopathy 03/06/2015  . DDD (degenerative disc disease), cervical 03/05/2015  . Occipital neuralgia 03/05/2015  . Bilateral occipital neuralgia 03/05/2015  . H/O elevated lipids 03/01/2015  . H/O: HTN (hypertension) 03/01/2015  . H/O: hypothyroidism 03/01/2015  . Insomnia, persistent 03/01/2015  . H/O disease 03/01/2015  . Aggrieved 03/01/2015  . Vitamin D deficiency 03/01/2015  . Generalized anxiety disorder 03/01/2015  . H/O diabetes mellitus 03/01/2015  . Bipolar I disorder, most recent episode depressed, in remission ([place]) 03/01/2015  . Anxiety 03/01/2015  . Fibromyalgia 03/01/2015  .  Essential (primary) hypertension 09/02/2014  . Mixed hyperlipidemia 04/19/2014  . Hypertensive urgency 12/15/2013  . Dyspnea 11/24/2013  . Anxiety and depression 02/17/2013  . OP (osteoporosis) 08/27/2012  . Persons encountering health services in other specified circumstances 08/27/2012  . Coronary artery disease of native artery of native heart with stable angina pectoris ([place]) 07/15/2012  . Back pain, chronic 07/15/2012  . Diabetes mellitus ([place]) 07/15/2012  . GERD (gastroesophageal reflux disease) 07/15/2012  . Bipolar disorder, in partial remission, most recent  episode depressed ([place]) 07/15/2012  . Hypertriglyceridemia 07/15/2012  . Hernia of anterior abdominal wall 07/15/2012  . PVD (peripheral vascular disease) ([place]) 05/21/2012  . S/P CABG x 3 02/17/2012  . Dizziness 09/04/2011  . Back pain 09/04/2011  . HTN (hypertension) 01/31/2011  . SMOKER 06/14/2010  . CAD (coronary artery disease) 06/13/2010  . CHEST PAIN UNSPECIFIED 06/13/2010    Past Surgical History:  Procedure Laterality Date  . ABDOMINAL HYSTERECTOMY    . 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 APPLICATION OF ROBOTIC ASSISTANCE FOR SPINAL PROCEDURE N/A 12/10/2016   Procedure: APPLICATION OF ROBOTIC ASSISTANCE FOR SPINAL PROCEDURE;  Surgeon: Kevan Ny Ditty, MD;  Location: [place];  Service: Neurosurgery;  Laterality: N/A;  . BACK SURGERY  2007   lumbar  . BREAST IMPLANT REMOVAL    . CARDIAC CATHETERIZATION     CAD,PCI of LAD, Cypher stent 2.5-28mm  . CARDIAC CATHETERIZATION     PCI of mid-LAD in stent restenosis with a drug-eluting stent  . CARDIAC CATHETERIZATION  06/03/13   [facility];MID LAD 100 % STENOSIS; PRIOR STENT; MID RCA 40 % STENOSIS  . COLONOSCOPY WITH PROPOFOL N/A 08/13/2018   Procedure: COLONOSCOPY WITH PROPOFOL;  Surgeon: [name], MD;  Location: [facility] ENDOSCOPY;  Service: Gastroenterology;  Laterality: N/A;  . CORONARY ARTERY BYPASS GRAFT   08-12-2011   CABG x 3  . CORONARY STENT PLACEMENT    . ESOPHAGOGASTRODUODENOSCOPY (EGD) WITH PROPOFOL N/A 08/13/2018   Procedure: ESOPHAGOGASTRODUODENOSCOPY (EGD) WITH PROPOFOL;  Surgeon: [name], MD;  Location: [facility]  ENDOSCOPY;  Service: Gastroenterology;  Laterality: N/A;  . LUMBAR FUSION     L1-S1  . LUMBAR PERCUTANEOUS PEDICLE SCREW 1 LEVEL N/A 12/10/2016   Procedure: Extension of pedicle screw fixation to LUMBAR FOUR;  Surgeon: Kevan Ny Ditty, MD;  Location: [place];  Service: Neurosurgery;  Laterality: N/A;  . NASAL SINUS SURGERY  1997   tuirbinate reduction   . RECTAL EXAM UNDER ANESTHESIA N/A 10/06/2018   Procedure: RECTAL EXAM UNDER ANESTHESIA;  Surgeon: Jules Husbands, MD;  Location: [facility] ORS;  Service: General;  Laterality: N/A;    Prior to Admission medications   Medication Sig Start Date End Date Taking? Authorizing Provider  albuterol (VENTOLIN HFA) 108 (90 Base) MCG/ACT inhaler SMARTSIG:2 Puff(s) By Mouth Every 4 Hours PRN 11/03/19   [provider]  albuterol-ipratropium (COMBIVENT) 18-103 MCG/ACT inhaler Inhale 2 puffs into the lungs daily as needed for wheezing or shortness of breath.     [provider]  amLODipine (NORVASC) 10 MG tablet Take 10 mg by mouth daily. 12/21/19   [provider]  aspirin 81 MG chewable tablet Chew by  mouth daily.    [provider]  atorvastatin (LIPITOR) 80 MG tablet Take 1 tablet (80 mg total) by mouth daily. 01/27/19   Minna Merritts, MD  benzonatate (TESSALON) 200 MG capsule  11/11/19   [provider]  carisoprodol (SOMA) 350 MG tablet Take 1 tablet (350 mg total) by mouth every 6 (six) hours as needed for muscle spasms. Patient taking differently: Take 350 mg by mouth 2 (two) times daily as needed for muscle spasms.  12/12/16   Ditty, Kevan Ny, MD  carvedilol (COREG) 12.5 MG tablet Take 1 tablet (12.5 mg total) by mouth 2 (two) times daily with a meal. 01/27/19   Gollan, Kathlene November, MD  DETROL LA 4 MG 24 hr capsule Take 4 mg by mouth daily. 12/17/19   [provider]  doxycycline (VIBRAMYCIN) 100 MG capsule Take 100 mg by mouth 2 (two) times daily. 11/01/19   [provider]  estradiol  (ESTRACE) 0.5 MG tablet Place vaginally.    [provider]  estradiol (ESTRING) 2 MG vaginal ring Place vaginally. 03/04/19 03/03/20  [provider]  fluticasone (FLONASE) 50 MCG/ACT nasal spray Place 1 spray into both nostrils 2 (two) times daily. 11/05/19   [provider]  fluticasone (FLOVENT HFA) 110 MCG/ACT inhaler Inhale into the lungs. 12/30/17   [provider]  FREESTYLE LITE test strip  10/07/18   [provider]  gabapentin (NEURONTIN) 300 MG capsule Take 300 mg by mouth 2 (two) times daily.  06/10/19 06/09/20  [provider]  HUMULIN 70/30 KWIKPEN (70-30) 100 UNIT/ML PEN Inject 10-15 Units into the skin See admin instructions. Inject 10-15 units SQ in the morning and inject 10-12 units SQ at bedtime per sliding scale 05/07/16   [provider]  HYDROcodone-acetaminophen (NORCO/VICODIN) 5-325 MG tablet Take 1 tablet by mouth every 6 (six) hours as needed for moderate pain. 10/06/18   Pabon, Marjory Lies, MD  hydrOXYzine (ATARAX/VISTARIL) 25 MG tablet Take 0.5-1 tablets (12.5-25 mg total) by mouth 3 (three) times daily as needed for anxiety. 03/03/19   Ursula Alert, MD  Insulin Pen Needle (FIFTY50 PEN NEEDLES) 31G X 8 MM MISC 1 Each by Misc.(Non-Drug; Combo Route) route As Directed. 02/17/12   [provider]  Insulin Pen Needle (PEN NEEDLES 31GX5/16") 31G X 8 MM MISC 1 Each by Misc.(Non-Drug; Combo Route) route As Directed. 02/17/12   [provider]  metFORMIN (GLUCOPHAGE-XR) 750 MG 24 hr tablet Take by mouth. 10/07/18   [provider]  mirabegron ER (MYRBETRIQ) 25 MG TB24 tablet Take by mouth. 07/09/18   [provider]  mirtazapine (REMERON) 15 MG tablet TAKE ONE AND ONE-HALF TABLETS AT BEDTIME 08/06/19   Ursula Alert, MD  nitroGLYCERIN (NITROSTAT) 0.4 MG SL tablet Place 1 tablet (0.4 mg total) under the tongue every 5 (five) minutes as needed for chest pain. 08/18/17   Minna Merritts, MD    omeprazole (PRILOSEC) 20 MG capsule Take 20 mg by mouth daily.     [provider]  Oxcarbazepine (TRILEPTAL) 300 MG tablet Take 3 tablets (900 mg total) by mouth at bedtime. 08/30/19   Ursula Alert, MD  oxyCODONE (OXY IR/ROXICODONE) 5 MG immediate release tablet Take 5 mg by mouth See admin instructions. Take 5 mg by mouth up to 5 times daily as needed for severe pain 06/22/18   [provider]  Oxycodone HCl 10 MG TABS LIMIT ONE HALF TO ONE TABLET BY MOUTH FOUR TO SIX TIMES PER DAY IF  TOLERATED (NO HYDROCODONE ACETAMINOPHEN) 04/27/19   [provider]  Oxycodone HCl 10 MG TABS LIMIT 1 2 TO 1 (ONE HALF TO ONE) TABLET BY MOUTH 4 6 TIMES DAILY IF TOLERATED NO HYDROCODONE ACETAMINOPHEN 06/02/19   [provider]  oxyCODONE-acetaminophen (PERCOCET) 10-325 MG tablet Take by mouth.    [provider]  RESTASIS 0.05 % ophthalmic emulsion Place 1 drop into both eyes daily as needed (for dryness).  09/16/15   [provider]  rOPINIRole (REQUIP) 0.5 MG tablet Take 1 mg by mouth at bedtime.  03/06/18   [provider]  sitaGLIPtin (JANUVIA) 100 MG tablet Take 100 mg by mouth daily. 09/24/16 08/02/19  [provider]  SYNTHROID 25 MCG tablet Take 25 mcg by mouth daily before breakfast.  03/08/18   [provider]  telmisartan (MICARDIS) 80 MG tablet Take 80 mg by mouth daily. 11/05/19   [provider]  telmisartan (MICARDIS) 80 MG tablet Take by mouth. 11/05/19 11/04/20  [provider]  zolpidem (AMBIEN) 10 MG tablet TAKE ONE-HALF (1/2) TO 1 TABLET AT BEDTIME AS NEEDED FOR SLEEP 11/29/19   Ursula Alert, MD     Allergies Patient has no known allergies.  Family History  Problem Relation Age of Onset  . Aneurysm Father        abdominal  . Diabetes Father   . Alcohol abuse Father   . Hypertension Father   . Aneurysm Brother        abdominal  . Alcohol abuse Brother   . Hypertension Brother   . Colon cancer  Brother   . Diabetes Mother   . Alcohol abuse Mother   . Hypertension Other        family hx  . Hyperlipidemia Other        family hx  . Diabetes Other        family hx    Social History Social History   Tobacco Use  . Smoking status: Former Smoker    Years: 16.00    Types: Cigarettes    Quit date: 06/20/2010    Years since quitting: 9.5  . Smokeless tobacco: Never Used  Substance Use Topics  . Alcohol use: No    Alcohol/week: 0.0 standard drinks  . Drug use: No    Review of Systems  Constitutional: No fever/chills Eyes: No visual changes.  ENT: No sore throat. Cardiovascular: As above Respiratory: Denies shortness of breath. Gastrointestinal: As above Genitourinary: Negative for dysuria. Musculoskeletal: Negative for back pain. Skin: Negative for rash. Neurological: Negative for headaches   ____________________________________________   PHYSICAL EXAM:  VITAL SIGNS: ED Triage Vitals  Enc Vitals Group     BP 12/31/19 1148 (!) 182/96     Pulse Rate 12/31/19 1148 79     Resp 12/31/19 1148 18     Temp 12/31/19 1148 99.4 F (37.4 C)     Temp Source 12/31/19 1148 Oral     SpO2 12/31/19 1148 96 %     Weight 12/31/19 1138 73.5 kg (162 lb)     Height 12/31/19 1138 1.6 m (5\' 3" )     Head Circumference --      Peak Flow --      Pain Score 12/31/19 1138 7     Pain Loc --      Pain Edu? --      Excl. in [place]? --     Constitutional: Alert and oriented.   Nose: No congestion/rhinnorhea. Mouth/Throat: Mucous membranes are moist.  Cardiovascular: Normal rate, regular rhythm. Grossly normal heart sounds.  Good peripheral circulation. Respiratory: Normal respiratory effort.  No retractions. Lungs CTAB. Gastrointestinal: Soft and nontender. No distention.  No CVA tenderness.  Musculoskeletal: No lower extremity tenderness nor edema.  Warm and well perfused Neurologic:  Normal speech and language. No gross focal neurologic deficits are appreciated.  Skin:  Skin  is warm, dry and intact. No rash noted. Psychiatric: Mood and affect are normal. Speech and behavior are normal.  ____________________________________________   LABS (all labs ordered are listed, but only abnormal results are displayed)  Labs Reviewed  BASIC METABOLIC PANEL - Abnormal; Notable for the following components:      Result Value   Sodium 124 (*)    Chloride 89 (*)    Glucose, Bld 167 (*)    All other components within normal limits  SARS CORONAVIRUS 2 (TAT 6-24 HRS)  CBC  TROPONIN I (HIGH SENSITIVITY)  TROPONIN I (HIGH SENSITIVITY)   ____________________________________________  EKG  ED ECG REPORT I, Lavonia Drafts, the attending physician, personally viewed and interpreted this ECG.  Date: 12/31/2019  Rhythm: normal sinus rhythm QRS Axis: normal Intervals: normal ST/T Wave abnormalities: normal Narrative Interpretation: no evidence of acute ischemia  ____________________________________________  RADIOLOGY  Chest x-ray unremarkable ____________________________________________   PROCEDURES  Procedure(s) performed: No  Procedures   Critical Care performed: No ____________________________________________   INITIAL IMPRESSION / ASSESSMENT AND PLAN / ED COURSE  Pertinent labs & imaging results that were available during my care of the patient were reviewed by me and considered in my medical decision making (see chart for details).  Patient presents with chest pain, EKG and troponin are reassuring however patient is significantly hypertensive.  Additionally on lab work found to have hyponatremia of 124.  She is not currently having any chest pain.  However given above findings will consult hospitalist for admission    ____________________________________________   FINAL CLINICAL IMPRESSION(S) / ED DIAGNOSES  Final diagnoses:  Nonspecific chest pain  Essential hypertension  Hyponatremia        Note:  This document was prepared using  Dragon voice recognition software and may include unintentional dictation errors.   Lavonia Drafts, MD 12/31/19 1331

## 2019-12-31 NOTE — ED Notes (Signed)
Assisted pt to toilet 

## 2019-12-31 NOTE — ED Notes (Signed)
Called pharmacy about meds still unverified

## 2019-12-31 NOTE — ED Notes (Signed)
Pt reports some light headedness/diziness. Pt reports he BP was not controlled with her lisinopril, and her doctor changed her to Telmisartan approx 1 month ago. BP has maintained elevation. Pt reports diagnosis with pneumonia 1 month ago, which she was prescribed an antibiotic for. Pt reports feeling weak since then. Pt also reports increased in prescribed dosage of amlodipine approx 1-2 weeks ago

## 2019-12-31 NOTE — ED Triage Notes (Signed)
PT to ER reports HTN for last several weeks, started new medications.  States  noted last night she was having back pain and that her BP was SBP 190s.  Pt reports left arm pain and mild chest pain.  Pt states SHOB and mild nausea last night.  Pt has cardiac hx.

## 2019-12-31 NOTE — ED Triage Notes (Signed)
FIRST NURSE NOTE - pt here for chest pain, + cardiac hx. Pulled for EKG.  Ambulatory, declined wheelchair.

## 2020-01-01 ENCOUNTER — Encounter: Payer: Self-pay | Admitting: Internal Medicine

## 2020-01-01 ENCOUNTER — Observation Stay

## 2020-01-01 DIAGNOSIS — E871 Hypo-osmolality and hyponatremia: Secondary | ICD-10-CM

## 2020-01-01 DIAGNOSIS — E1169 Type 2 diabetes mellitus with other specified complication: Secondary | ICD-10-CM | POA: Diagnosis not present

## 2020-01-01 DIAGNOSIS — E039 Hypothyroidism, unspecified: Secondary | ICD-10-CM

## 2020-01-01 DIAGNOSIS — R079 Chest pain, unspecified: Secondary | ICD-10-CM | POA: Diagnosis not present

## 2020-01-01 DIAGNOSIS — I10 Essential (primary) hypertension: Secondary | ICD-10-CM | POA: Diagnosis not present

## 2020-01-01 DIAGNOSIS — E785 Hyperlipidemia, unspecified: Secondary | ICD-10-CM

## 2020-01-01 LAB — BASIC METABOLIC PANEL
Anion gap: 10 (ref 5–15)
BUN: 12 mg/dL (ref 8–23)
CO2: 22 mmol/L (ref 22–32)
Calcium: 8.2 mg/dL — ABNORMAL LOW (ref 8.9–10.3)
Chloride: 94 mmol/L — ABNORMAL LOW (ref 98–111)
Creatinine, Ser: 0.6 mg/dL (ref 0.44–1.00)
GFR calc Af Amer: >60 mL/min (ref >60)
GFR calc non Af Amer: >60 mL/min (ref >60)
Glucose, Bld: 130 mg/dL — ABNORMAL HIGH (ref 70–99)
Potassium: 3.3 mmol/L — ABNORMAL LOW (ref 3.5–5.1)
Sodium: 126 mmol/L — ABNORMAL LOW (ref 135–145)

## 2020-01-01 LAB — URINALYSIS, ROUTINE W REFLEX MICROSCOPIC
Bilirubin Urine: NEGATIVE
Glucose, UA: NEGATIVE mg/dL
Hgb urine dipstick: NEGATIVE
Ketones, ur: NEGATIVE mg/dL
Leukocytes,Ua: NEGATIVE
Nitrite: NEGATIVE
Protein, ur: 100 mg/dL — AB
Specific Gravity, Urine: 1.01 (ref 1.005–1.030)
pH: 5 (ref 5.0–8.0)

## 2020-01-01 LAB — GLUCOSE, CAPILLARY
Glucose-Capillary: 143 mg/dL — ABNORMAL HIGH (ref 70–99)
Glucose-Capillary: 189 mg/dL — ABNORMAL HIGH (ref 70–99)
Glucose-Capillary: 107 mg/dL — ABNORMAL HIGH (ref 70–99)
Glucose-Capillary: 155 mg/dL — ABNORMAL HIGH (ref 70–99)

## 2020-01-01 LAB — CBC
HCT: 35.9 % — ABNORMAL LOW (ref 36.0–46.0)
Hemoglobin: 12.4 g/dL (ref 12.0–15.0)
MCH: 28.5 pg (ref 26.0–34.0)
MCHC: 34.5 g/dL (ref 30.0–36.0)
MCV: 82.5 fL (ref 80.0–100.0)
Platelets: 266 10*3/uL (ref 150–400)
RBC: 4.35 MIL/uL (ref 3.87–5.11)
RDW: 13.1 % (ref 11.5–15.5)
WBC: 6 10*3/uL (ref 4.0–10.5)
nRBC: 0 % (ref 0.0–0.2)

## 2020-01-01 LAB — OSMOLALITY, URINE: Osmolality, Ur: 292 mOsm/kg — ABNORMAL LOW (ref 300–900)

## 2020-01-01 LAB — SODIUM, URINE, RANDOM: Sodium, Ur: 39 mmol/L

## 2020-01-01 LAB — HIV ANTIBODY (ROUTINE TESTING W REFLEX): HIV Screen 4th Generation wRfx: NONREACTIVE

## 2020-01-01 LAB — SARS CORONAVIRUS 2 (TAT 6-24 HRS): SARS Coronavirus 2: NEGATIVE

## 2020-01-01 MED ORDER — SODIUM CHLORIDE 1 G PO TABS
1.0000 g | ORAL_TABLET | Freq: Three times a day (TID) | ORAL | Status: DC
  Administered 2020-01-01 – 2020-01-02 (×2): 1 g via ORAL
  Filled 2020-01-01 (×6): qty 1

## 2020-01-01 MED ORDER — POTASSIUM CHLORIDE CRYS ER 20 MEQ PO TBCR
40.0000 meq | EXTENDED_RELEASE_TABLET | Freq: Every day | ORAL | Status: DC
  Administered 2020-01-01 – 2020-01-02 (×2): 40 meq via ORAL
  Filled 2020-01-01: qty 2
  Filled 2020-01-01: qty 4

## 2020-01-01 MED ORDER — FUROSEMIDE 20 MG PO TABS
20.0000 mg | ORAL_TABLET | Freq: Every day | ORAL | Status: DC
  Administered 2020-01-01 – 2020-01-02 (×2): 20 mg via ORAL
  Filled 2020-01-01 (×2): qty 1

## 2020-01-01 MED ORDER — INSULIN ASPART PROT & ASPART (70-30 MIX) 100 UNIT/ML ~~LOC~~ SUSP
10.0000 [IU] | Freq: Two times a day (BID) | SUBCUTANEOUS | Status: DC
  Administered 2020-01-01 – 2020-01-02 (×2): 10 [IU] via SUBCUTANEOUS
  Filled 2020-01-01 (×2): qty 10

## 2020-01-01 MED ORDER — IOHEXOL 350 MG/ML SOLN
75.0000 mL | Freq: Once | INTRAVENOUS | Status: AC | PRN
  Administered 2020-01-01: 75 mL via INTRAVENOUS

## 2020-01-01 NOTE — Progress Notes (Signed)
Patient ID: COLBI JARIWALA, female   DOB: 1956/06/22, 64 y.o.   MRN: [id number] [facility] PROGRESS NOTE  Yuleimi Belmer Stipe [id number] DOB: 11-Nov-1955 DOA: 12/31/2019 PCP: [place]  HPI/Subjective: Patient feels tired and weak.  States she has had a lot of stress at home.  No further chest pain.  No numbness.  Always have some shortness of breath.  Patient states he drinks of a lot of iced tea at home.  Objective: Vitals:   01/01/20 0830 01/01/20 1144  BP: 139/85 110/63  Pulse: 72 71  Resp:    Temp: 98.5 F (36.9 C) 98.7 F (37.1 C)  SpO2: 100% 99%    Intake/Output Summary (Last 24 hours) at 01/01/2020 1412 Last data filed at 01/01/2020 0830 Gross per 24 hour  Intake 856.67 ml  Output 700 ml  Net 156.67 ml   Filed Weights   12/31/19 1138 01/01/20 0556  Weight: 73.5 kg 73.1 kg    ROS: Review of Systems  Constitutional: Positive for malaise/fatigue. Negative for chills and fever.  Eyes: Negative for blurred vision.  Respiratory: Negative for cough and shortness of breath.   Cardiovascular: Negative for chest pain.  Gastrointestinal: Negative for abdominal pain, constipation, diarrhea, nausea and vomiting.  Genitourinary: Negative for dysuria.  Musculoskeletal: Negative for joint pain.  Neurological: Negative for dizziness and headaches.   Exam: Physical Exam  Constitutional: She is oriented to person, place, and time.  HENT:  Nose: No mucosal edema.  Mouth/Throat: No oropharyngeal exudate or posterior oropharyngeal edema.  Eyes: Conjunctivae and lids are normal.  Neck: Carotid bruit is not present.  Cardiovascular: S1 normal and S2 normal. Exam reveals no gallop.  No murmur heard. Respiratory: No respiratory distress. She has decreased breath sounds in the right lower field and the left lower field. She has no wheezes. She has no rhonchi. She has no rales.  GI: Soft. Bowel sounds are normal. There is no abdominal tenderness.  Musculoskeletal:      Right ankle: No swelling.     Left ankle: No swelling.  Lymphadenopathy:    She has no cervical adenopathy.  Neurological: She is alert and oriented to person, place, and time. No cranial nerve deficit.  Skin: Skin is warm. No rash noted. Nails show no clubbing.  Psychiatric: She has a normal mood and affect.      Data Reviewed: Basic Metabolic Panel: Recent Labs  Lab 12/31/19 1145 01/01/20 0523  NA 124* 126*  K 4.5 3.3*  CL 89* 94*  CO2 24 22  GLUCOSE 167* 130*  BUN 12 12  CREATININE 0.55 0.60  CALCIUM 10.0 8.2*   CBC: Recent Labs  Lab 12/31/19 1145 01/01/20 0523  WBC 6.1 6.0  HGB 14.3 12.4  HCT 41.8 35.9*  MCV 82.9 82.5  PLT 321 266     CBG: Recent Labs  Lab 12/31/19 1724 12/31/19 2059 01/01/20 0831 01/01/20 1145  GLUCAP 154* 92 143* 189*    Recent Results (from the past 240 hour(s))  SARS CORONAVIRUS 2 (TAT 6-24 HRS) Nasopharyngeal Nasopharyngeal Swab     Status: None   Collection Time: 12/31/19  1:45 PM   Specimen: Nasopharyngeal Swab  Result Value Ref Range Status   SARS Coronavirus 2 NEGATIVE NEGATIVE Final    Comment: (NOTE) SARS-CoV-2 target nucleic acids are NOT DETECTED. The SARS-CoV-2 RNA is generally detectable in upper and lower respiratory specimens during the acute phase of infection. Negative results do not preclude SARS-CoV-2 infection, do not rule out co-infections with  other pathogens, and should not be used as the sole basis for treatment or other patient management decisions. Negative results must be combined with clinical observations, patient history, and epidemiological information. The expected result is Negative. Fact Sheet for Patients: SugarRoll.be Fact Sheet for Healthcare Providers: https://www.woods-mathews.com/ This test is not yet approved or cleared by the [place] FDA and  has been authorized for detection and/or diagnosis of SARS-CoV-2 by FDA under an Emergency Use  Authorization (EUA). This EUA will remain  in effect (meaning this test can be used) for the duration of the COVID-19 declaration under Section 56 4(b)(1) of the Act, 21 U.S.C. section 360bbb-3(b)(1), unless the authorization is terminated or revoked sooner. Performed at [facility], [place] [address], [place], [place] [address]      Studies: DG Chest 2 View  Result Date: 12/31/2019 CLINICAL DATA:  Chest pain EXAM: CHEST - 2 VIEW COMPARISON:  07/04/2018 FINDINGS: Post CABG changes. The heart size and mediastinal contours are within normal limits. No focal airspace consolidation, pleural effusion, or pneumothorax. The visualized skeletal structures are unremarkable. IMPRESSION: No active cardiopulmonary disease. Electronically Signed   By: Davina Poke D.O.   On: 12/31/2019 12:34   CT ANGIO CHEST PE W OR WO CONTRAST  Result Date: 01/01/2020 CLINICAL DATA:  64 year old with left sided back pain that radiates down the left arm. History of coronary artery disease. EXAM: CT ANGIOGRAPHY CHEST WITH CONTRAST TECHNIQUE: Multidetector CT imaging of the chest was performed using the standard protocol during bolus administration of intravenous contrast. Multiplanar CT image reconstructions and MIPs were obtained to evaluate the vascular anatomy. CONTRAST:  7mL OMNIPAQUE IOHEXOL 350 MG/ML SOLN COMPARISON:  Chest CT 09/20/2008 FINDINGS: Cardiovascular: Satisfactory opacification of the pulmonary arteries to the segmental level. No evidence of pulmonary embolism. Normal heart size. No pericardial effusion. Prior CABG procedure. Normal caliber of the thoracic aorta. Although this is not a dedicated aortic examination, there is no gross aortic dissection. Native coronary arteries are calcified and evidence for a coronary artery stent. No significant pericardial effusion. Mediastinum/Nodes: No chest lymphadenopathy. Lungs/Pleura: Trachea and mainstem bronchi are patent. No large pleural effusions. No  significant airspace disease or lung consolidation. Upper Abdomen: Images of the upper abdomen are unremarkable. Musculoskeletal: No acute bone abnormality. Review of the MIP images confirms the above findings. IMPRESSION: 1. No acute chest abnormality. Specifically, no evidence for a pulmonary embolism. 2. Coronary artery disease and post CABG. Electronically Signed   By: Markus Daft M.D.   On: 01/01/2020 10:15    Scheduled Meds: . amLODipine  10 mg Oral Daily  . aspirin EC  81 mg Oral Daily  . atorvastatin  80 mg Oral QPM  . carvedilol  12.5 mg Oral BID WC  . enoxaparin (LOVENOX) injection  40 mg Subcutaneous Q24H  . fesoterodine  4 mg Oral Daily  . fluticasone  1 spray Each Nare BID  . fluticasone  1 puff Inhalation BID  . furosemide  20 mg Oral Daily  . gabapentin  300 mg Oral BID  . insulin aspart  0-15 Units Subcutaneous TID WC  . insulin aspart  4 Units Subcutaneous TID WC  . levothyroxine  25 mcg Oral QAC breakfast  . metFORMIN  750 mg Oral BID WC  . Oxcarbazepine  900 mg Oral QHS  . pantoprazole  40 mg Oral Daily  . potassium chloride  40 mEq Oral Daily  . rOPINIRole  1 mg Oral QHS  . sodium chloride  1 g  Oral TID WC    Assessment/Plan:  1. Hyponatremia.  Looking back at previous sodiums sodiums have had a wide variety of ranges in the past.  She has been in the normal range and also been very low.  Send off a urine osmolarity and a urine sodium.  CT scan of the chest ordered by me is negative for any mass in the lungs.  I did start on salt tabs and low-dose Lasix.  Advised fluid restriction and cutting back on ice tea.  Spoke with pharmacist and reviewed medication list and none of the medications would specifically cause hyponatremia. 2. Chest pain likely secondary to anxiety and stress at home.  Recent stress test within the year which was low risk scan.  Cardiac enzymes negative.  CT angio of the chest negative for pulmonary embolism. 3. Essential hypertension blood pressure  better being here in the hospital.  Continue same medications 4. Type 2 diabetes mellitus with hyperlipidemia.  Restart 70/30 insulin also on Metformin which I will need to hold.  On Lipitor 5. Hypothyroidism unspecified on levothyroxine 6. Physical therapy evaluate  Code Status:     Code Status Orders  (From admission, onward)         Start     Ordered   12/31/19 1432  Full code  Continuous     12/31/19 1435        Code Status History    Date Active Date Inactive Code Status Order ID Comments User Context   07/05/2018 0056 07/06/2018 1744 Full Code [id number]  Lance Coon, MD Inpatient   12/10/2016 1617 12/12/2016 2108 Full Code [id number]  Ditty, Kevan Ny, MD Inpatient   Advance Care Planning Activity     Family Communication: Spoke with husband on the phone Disposition Plan: I would like sodium to be a little bit better prior to disposition.  Starting treatments to get sodium higher.  Recheck sodium tomorrow morning and if better will discharge home.  Time spent: 28 minutes  [place]

## 2020-01-02 DIAGNOSIS — I10 Essential (primary) hypertension: Secondary | ICD-10-CM | POA: Diagnosis not present

## 2020-01-02 DIAGNOSIS — E871 Hypo-osmolality and hyponatremia: Secondary | ICD-10-CM | POA: Diagnosis not present

## 2020-01-02 DIAGNOSIS — E1169 Type 2 diabetes mellitus with other specified complication: Secondary | ICD-10-CM | POA: Diagnosis not present

## 2020-01-02 DIAGNOSIS — R531 Weakness: Secondary | ICD-10-CM

## 2020-01-02 DIAGNOSIS — R079 Chest pain, unspecified: Secondary | ICD-10-CM | POA: Diagnosis not present

## 2020-01-02 LAB — BASIC METABOLIC PANEL
Anion gap: 13 (ref 5–15)
BUN: 16 mg/dL (ref 8–23)
CO2: 23 mmol/L (ref 22–32)
Calcium: 9.1 mg/dL (ref 8.9–10.3)
Chloride: 93 mmol/L — ABNORMAL LOW (ref 98–111)
Creatinine, Ser: 0.62 mg/dL (ref 0.44–1.00)
GFR calc Af Amer: >60 mL/min (ref >60)
GFR calc non Af Amer: >60 mL/min (ref >60)
Glucose, Bld: 140 mg/dL — ABNORMAL HIGH (ref 70–99)
Potassium: 3.8 mmol/L (ref 3.5–5.1)
Sodium: 129 mmol/L — ABNORMAL LOW (ref 135–145)

## 2020-01-02 LAB — HEMOGLOBIN A1C
Hgb A1c MFr Bld: 7.2 % — ABNORMAL HIGH (ref 4.8–5.6)
Mean Plasma Glucose: 159.94 mg/dL

## 2020-01-02 LAB — GLUCOSE, CAPILLARY: Glucose-Capillary: 130 mg/dL — ABNORMAL HIGH (ref 70–99)

## 2020-01-02 MED ORDER — FUROSEMIDE 20 MG PO TABS: 20.0000 mg | ORAL_TABLET | Freq: Every day | ORAL | 0 refills | Status: DC

## 2020-01-02 MED ORDER — SODIUM CHLORIDE 1 G PO TABS: 1.0000 g | ORAL_TABLET | Freq: Three times a day (TID) | ORAL | 0 refills | Status: DC

## 2020-01-02 MED ORDER — POTASSIUM CHLORIDE CRYS ER 10 MEQ PO TBCR: 10.0000 meq | EXTENDED_RELEASE_TABLET | Freq: Every day | ORAL | 0 refills | Status: DC

## 2020-01-02 MED ORDER — CARISOPRODOL 350 MG PO TABS: 350.0000 mg | ORAL_TABLET | Freq: Two times a day (BID) | ORAL | Status: DC | PRN

## 2020-01-02 NOTE — Discharge Instructions (Signed)
Hyponatremia Hyponatremia is when the amount of salt (sodium) in your blood is too low. When sodium levels are low, your cells absorb extra water, which causes them to swell. The swelling happens throughout the body, but it mostly affects the brain. What are the causes? This condition may be caused by:  Certain medical conditions, such as: ? Heart, kidney, or liver problems. ? Thyroid problems. ? Adrenal gland problems. ? Metabolic conditions, such as Addison disease or syndrome of inappropriate antidiuretic hormone (SIADH).  Severe vomiting or diarrhea.  Certain medicines or illegal drugs.  Dehydration.  Drinking too much water.  Eating a diet that is low in sodium.  Large burns on your body.  Excessive sweating. What increases the risk? You are more likely to develop this condition if you:  Have long-term (chronic) kidney disease.  Have heart failure.  Have a medical condition that causes frequent or excessive diarrhea.  Participate in intense physical activities, such as marathon running.  Take certain medicines that affect the sodium and fluid balance in the blood. Some of these medicine types include: ? Diuretics. ? NSAIDs. ? Some opioid pain medicines. ? Some antidepressants. ? Some seizure prevention medicines. What are the signs or symptoms? Symptoms of this condition include:  Headache.  Nausea and vomiting.  Being very tired (lethargic).  Muscle weakness and cramping.  Loss of appetite.  Feeling weak or light-headed. Severe symptoms of this condition include:  Confusion.  Agitation.  Having a rapid heart rate.  Passing out (fainting).  Seizures.  Coma. How is this diagnosed? This condition is diagnosed based on:  A physical exam.  Your medical history.  Tests, including: ? Blood tests. ? Urine tests. How is this treated? Treatment for this condition depends on the cause. Treatment may include:  Getting fluids through an IV  that is inserted into one of your veins.  Medicines to correct the sodium imbalance. If medicines are causing the condition, the medicines will need to be adjusted.  Limiting your water or fluid intake to get the correct sodium balance.  Monitoring in the hospital setting to closely watch your symptoms for improvement. Follow these instructions at home:   Take over-the-counter and prescription medicines only as told by your health care provider. Many medicines can make this condition worse. Talk with your health care provider about any medicines that you are currently taking.  Carefully follow a recommended diet as told by your health care provider.  Carefully follow instructions from your health care provider about fluid restrictions.  Do not drink alcohol.  Keep all follow-up visits as told by your health care provider. This is important. Contact a health care provider if:  You develop worsening nausea, fatigue, headache, confusion, or weakness.  Your symptoms go away and then return.  You have problems following the recommended diet. Get help right away if:  You have a seizure.  You pass out.  You have ongoing diarrhea or vomiting. Summary  Hyponatremia is when the amount of salt (sodium) in your blood is too low.  When sodium levels are low, your cells absorb extra water, which causes them to swell.  The swelling happens throughout the body, but it mostly affects the brain.  Treatment for this condition depends on the cause. It may include IV fluids, medicines, and limiting your fluid intake. This information is not intended to replace advice given to you by your health care provider. Make sure you discuss any questions you have with your health care provider. Document   Revised: 08/28/2018 Document Reviewed: 08/28/2018 Elsevier Patient Education  2020 [employer]  

## 2020-01-02 NOTE — Evaluation (Signed)
Physical Therapy Evaluation Patient Details Name: Jennifer [name] MRN: [id number] DOB: Dec 07, 1955 Today's Date: 01/02/2020   History of Present Illness  Jennifer [name] is a 64 y.o. female with medical history significant for insulin-dependent diabetes mellitus, coronary artery disease, hypertension and COPD who presented to the emergency room for evaluation of left-sided chest pain with radiation to her left arm and back.  It has been intermittent and recurring and not associated with rest or exertion.  In ER, EKG and troponin are reassuring. Chest CT and Xray are neg for PE, and show CAD and post-CABG.  Clinical Impression  Pt is a pleasant 64 year old F who was admitted for chest pain and above diagnoses. Pt performs bed mobility, transfers, and ambulation with mod I without AD. Vitals monitored throughout session and remain stable. Pt reportedly was previously independent for household and community ambulation without AD and reports no history of falls. Will DC pt from PT caseload as she is functioning at her baseline level and does not require assistance for mobility.     Follow Up Recommendations No PT follow up    Equipment Recommendations       Recommendations for Other Services       Precautions / Restrictions Precautions Precautions: None      Mobility  Bed Mobility Overal bed mobility: Modified Independent                Transfers Overall transfer level: Modified independent Equipment used: None                Ambulation/Gait Ambulation/Gait assistance: Modified independent (Device/Increase time) Gait Distance (Feet): 260 Feet Assistive device: None Gait Pattern/deviations: WFL(Within Functional Limits)     General Gait Details: Normal cadence/step length; bil hip ER and slightly increased BOS  Stairs            Wheelchair Mobility    Modified Rankin (Stroke Patients Only)       Balance Overall balance assessment: Modified  Independent                                           Pertinent Vitals/Pain Pain Assessment: No/denies pain    Home Living Family/patient expects to be discharged to:: Private residence Living Arrangements: Spouse/significant other;Children;Other relatives Available Help at Discharge: Family Type of Home: House Home Access: Stairs to enter Entrance Stairs-Rails: Left Entrance Stairs-Number of Steps: 5 Home Layout: One level Home Equipment: Cane - quad;Cane - single point;Walker - 4 wheels      Prior Function Level of Independence: Independent         Comments: ind without AD for household and community ambulation     Hand Dominance        Extremity/Trunk Assessment   Upper Extremity Assessment Upper Extremity Assessment: Overall WFL for tasks assessed    Lower Extremity Assessment Lower Extremity Assessment: Overall WFL for tasks assessed    Cervical / Trunk Assessment Cervical / Trunk Assessment: Normal  Communication   Communication: No difficulties  Cognition Arousal/Alertness: Awake/alert Behavior During Therapy: WFL for tasks assessed/performed Overall Cognitive Status: Within Functional Limits for tasks assessed                                        General Comments General comments (skin integrity,  edema, etc.): Able to safely ambulate in-room and around nurses' station without AD and no loss of balance    Exercises     Assessment/Plan    PT Assessment Patent does not need any further PT services  PT Problem List         PT Treatment Interventions      PT Goals (Current goals can be found in the Care Plan section)  Acute Rehab PT Goals Patient Stated Goal: to rest and go home PT Goal Formulation: With patient Time For Goal Achievement: 01/16/20 Potential to Achieve Goals: Good    Frequency     Barriers to discharge        Co-evaluation               AM-PAC PT "6 Clicks" Mobility   Outcome Measure Help needed turning from your back to your side while in a flat bed without using bedrails?: None Help needed moving from lying on your back to sitting on the side of a flat bed without using bedrails?: None Help needed moving to and from a bed to a chair (including a wheelchair)?: None Help needed standing up from a chair using your arms (e.g., wheelchair or bedside chair)?: None Help needed to walk in hospital room?: None Help needed climbing 3-5 steps with a railing? : None 6 Click Score: 24    End of Session Equipment Utilized During Treatment: Gait belt Activity Tolerance: Patient tolerated treatment well Patient left: in bed Nurse Communication: Mobility status PT Visit Diagnosis: Muscle weakness (generalized) (M62.81)    Time: [id number] PT Time Calculation (min) (ACUTE ONLY): 15 min   Charges:   PT Evaluation $PT Eval Low Complexity: 1 Low          Petra Kuba, PT, DPT 01/02/20, 9:47 AM

## 2020-01-02 NOTE — Discharge Summary (Addendum)
[facility] NAME: Jennifer [name]    MR#:  [id number]  DATE OF BIRTH:  24-Mar-1956  DATE OF ADMISSION:  12/31/2019 ADMITTING PHYSICIAN: Collier Bullock, MD  DATE OF DISCHARGE: 01/02/2020 11:58 AM  PRIMARY CARE PHYSICIAN: Dr Johny Drilling   ADMISSION DIAGNOSIS:  Hyponatremia [E87.1] Essential hypertension [I10] Chest pain [R07.9] Nonspecific chest pain [R07.9]  DISCHARGE DIAGNOSIS:  Principal Problem:   CHEST PAIN UNSPECIFIED Active Problems:   CAD (coronary artery disease)   HTN (hypertension)   Anxiety and depression   Type 2 diabetes mellitus with hyperlipidemia (HCC)   Hyponatremia   Chest pain   Hypothyroidism   SECONDARY DIAGNOSIS:   Past Medical History:  Diagnosis Date  . Anemia 1985   bled during a cervical biopsy, led to bleed transfusion   . Anginal pain ([place])   . Anxiety   . Arthritis    pt. reports that her neck is stiff   . Bipolar depression ([place])   . Blood dyscrasia    reported hx [name] '85; saw hematologist Dr. Janese Banks 10/2016, VWD work-up WNL, not felt to have a primary bleeding disorder   . CAD (coronary artery disease)   . 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 COPD (chronic obstructive pulmonary disease) ([place])    dr. Gar [facility]onto [facility]    PCP  . CRD (chronic renal disease)   . Depression   . Depression with anxiety   . Diabetes mellitus   . Dyspnea    W/ EXERTION   . Enlarged liver   . Family history of adverse reaction to anesthesia    father woke up slowly  . Fibromyalgia   . Generalized anxiety disorder   . GERD (gastroesophageal reflux disease)   . Hyperlipidemia   . Hypertension   . Hypothyroidism   . Low back pain   . Low sodium levels   . Migraines   . Neuropathy   . Neuropathy   . Persistent disorder of initiating or maintaining sleep   . Restless leg   . Von Willebrand disease ([place])     HOSPITAL COURSE:   1.  Hyponatremia.   Looking back at previous sodiums, she has had a wide range of her levels in the past.  She has been in the normal range and also been very low.  CT scan of the chest was negative for any mass in the lungs.  I advised the patient fluid restriction she does drink a lot of ice tea.  I spoke with the pharmacist in reviewing her medications and none of the medications specifically cause hyponatremia.  I started on salt tablets and low-dose Lasix.  Sodium was 124 on presentation and did come up to 129.  Since the patient's mental status is good I think it stable for her to go home on the salt tablets and low-dose Lasix and fluid restriction.  Recommend checking a BMP and follow-up appointment 2.  Chest pain likely secondary to anxiety and stress at home.  Recent stress test within the year was a low risk scan.  Cardiac enzymes here in the hospital were negative.  CT scan of the chest was negative for pulmonary embolism.  Patient's chest pain has resolved. 3.  Essential hypertension.  Blood pressure better being here in the hospital.  Was very elevated at home.  Continue usual medications.  I think the patient's  blood pressure was elevated secondary to stress and anxiety at home. 4.  Type 2 diabetes mellitus with hyperlipidemia.  Continue 70/30 insulin.  Hold Metformin since I did a CAT scan here.  Can consider restarting as outpatient.  On Lipitor. 5.  Hypothyroidism unspecified on levothyroxine.  Can check a TSH and follow-up appointment 6.  Weakness.  Patient did well with physical therapy and no need for physical therapy as outpatient.  DISCHARGE CONDITIONS:   Satisfactory  CONSULTS OBTAINED:  None  DRUG ALLERGIES:  No Known Allergies  DISCHARGE MEDICATIONS:   Allergies as of 01/02/2020   No Known Allergies     Medication List    STOP taking these medications   albuterol 108 (90 Base) MCG/ACT inhaler Commonly known as: VENTOLIN HFA   HYDROcodone-acetaminophen 5-325 MG tablet Commonly known  as: NORCO/VICODIN   metFORMIN 750 MG 24 hr tablet Commonly known as: GLUCOPHAGE-XR     TAKE these medications   albuterol-ipratropium 18-103 MCG/ACT inhaler Commonly known as: COMBIVENT Inhale 2 puffs into the lungs daily as needed for wheezing or shortness of breath.   amLODipine 10 MG tablet Commonly known as: NORVASC Take 10 mg by mouth in the morning and at bedtime.   atorvastatin 80 MG tablet Commonly known as: LIPITOR Take 1 tablet (80 mg total) by mouth daily.   carisoprodol 350 MG tablet Commonly known as: Soma Take 1 tablet (350 mg total) by mouth 2 (two) times daily as needed for muscle spasms.   carvedilol 12.5 MG tablet Commonly known as: COREG Take 1 tablet (12.5 mg total) by mouth 2 (two) times daily with a meal.   Detrol LA 4 MG 24 hr capsule Generic drug: tolterodine Take 4 mg by mouth daily.   estradiol 2 MG vaginal ring Commonly known as: ESTRING Place vaginally.   fluticasone 110 MCG/ACT inhaler Commonly known as: FLOVENT HFA Inhale 1 puff into the lungs daily as needed.   fluticasone 50 MCG/ACT nasal spray Commonly known as: FLONASE Place 1 spray into both nostrils daily as needed.   furosemide 20 MG tablet Commonly known as: LASIX Take 1 tablet (20 mg total) by mouth daily.   HumuLIN 70/30 KwikPen (70-30) 100 UNIT/ML KwikPen Generic drug: insulin isophane & regular human Inject 10-15 Units into the skin See admin instructions. Inject 10-15 units SQ in the morning and inject 10-12 units SQ at bedtime per sliding scale   mirtazapine 15 MG tablet Commonly known as: REMERON TAKE ONE AND ONE-HALF TABLETS AT BEDTIME   nitroGLYCERIN 0.4 MG SL tablet Commonly known as: NITROSTAT Place 1 tablet (0.4 mg total) under the tongue every 5 (five) minutes as needed for chest pain.   Oxcarbazepine 300 MG tablet Commonly known as: TRILEPTAL Take 3 tablets (900 mg total) by mouth at bedtime.   Oxycodone HCl 10 MG Tabs LIMIT ONE HALF TO ONE TABLET BY  MOUTH FOUR TO SIX TIMES PER DAY IF TOLERATED (NO HYDROCODONE ACETAMINOPHEN)   potassium chloride 10 MEQ tablet Commonly known as: KLOR-CON Take 1 tablet (10 mEq total) by mouth daily.   Restasis 0.05 % ophthalmic emulsion Generic drug: cycloSPORINE Place 1 drop into both eyes daily as needed (for dryness).   rOPINIRole 0.5 MG tablet Commonly known as: REQUIP Take 1 mg by mouth at bedtime.   sitaGLIPtin 100 MG tablet Commonly known as: JANUVIA Take 100 mg by mouth daily.   sodium chloride 1 g tablet Take 1 tablet (1 g total) by mouth 3 (three) times daily with meals.  Synthroid 25 MCG tablet Generic drug: levothyroxine Take 25 mcg by mouth daily before breakfast.   telmisartan 80 MG tablet Commonly known as: MICARDIS Take 80 mg by mouth daily.   zolpidem 10 MG tablet Commonly known as: AMBIEN TAKE ONE-HALF (1/2) TO 1 TABLET AT BEDTIME AS NEEDED FOR SLEEP What changed:   how much to take  how to take this  when to take this  additional instructions        DISCHARGE INSTRUCTIONS:   Follow-up PMD 5 days  If you experience worsening of your admission symptoms, develop shortness of breath, life threatening emergency, suicidal or homicidal thoughts you must seek medical attention immediately by calling 911 or calling your MD immediately  if symptoms less severe.  You Must read complete instructions/literature along with all the possible adverse reactions/side effects for all the Medicines you take and that have been prescribed to you. Take any new Medicines after you have completely understood and accept all the possible adverse reactions/side effects.   Please note  You were cared for by a hospitalist during your hospital stay. If you have any questions about your discharge medications or the care you received while you were in the hospital after you are discharged, you can call the unit and asked to speak with the hospitalist on call if the hospitalist that took  care of you is not available. Once you are discharged, your primary care physician will handle any further medical issues. Please note that NO REFILLS for any discharge medications will be authorized once you are discharged, as it is imperative that you return to your primary care physician (or establish a relationship with a primary care physician if you do not have one) for your aftercare needs so that they can reassess your need for medications and monitor your lab values.    Today   CHIEF COMPLAINT:   Chief Complaint  Patient presents with  . Hypertension  . Chest Pain    HISTORY OF PRESENT ILLNESS:  Jennifer [name]  is a 64 y.o. female came in with chest pain and elevated blood pressure   VITAL SIGNS:  Blood pressure 127/70, pulse 83, temperature 98.8 F (37.1 C), temperature source Oral, resp. rate 18, height 5\' 3"  (1.6 m), weight 73.1 kg, SpO2 98 %.   PHYSICAL EXAMINATION:  GENERAL:  64 y.o.-year-old patient lying in the bed with no acute distress.  EYES: Pupils equal, round, reactive to light and accommodation. No scleral icterus. HEENT: Head atraumatic, normocephalic. Oropharynx and nasopharynx clear.  NECK:  Supple, no jugular venous distention. No thyroid enlargement, no tenderness.  LUNGS: Normal breath sounds bilaterally, no wheezing, rales,rhonchi or crepitation. No use of accessory muscles of respiration.  CARDIOVASCULAR: S1, S2 normal. No murmurs, rubs, or gallops.  ABDOMEN: Soft, non-tender, non-distended. EXTREMITIES: No pedal edema.  NEUROLOGIC: Cranial nerves II through XII are intact. Muscle strength 5/5 in all extremities. Sensation intact. Gait not checked.  PSYCHIATRIC: The patient is alert and oriented x 3.  SKIN: No obvious rash, lesion, or ulcer.   DATA REVIEW:   CBC Recent Labs  Lab 01/01/20 0523  WBC 6.0  HGB 12.4  HCT 35.9*  PLT 266    Chemistries  Recent Labs  Lab 01/02/20 0800  NA 129*  K 3.8  CL 93*  CO2 23  GLUCOSE 140*  BUN 16   CREATININE 0.62  CALCIUM 9.1   Microbiology Results  Results for orders placed or performed during the hospital encounter of 12/31/19  SARS  CORONAVIRUS 2 (TAT 6-24 HRS) Nasopharyngeal Nasopharyngeal Swab     Status: None   Collection Time: 12/31/19  1:45 PM   Specimen: Nasopharyngeal Swab  Result Value Ref Range Status   SARS Coronavirus 2 NEGATIVE NEGATIVE Final    Comment: (NOTE) SARS-CoV-2 target nucleic acids are NOT DETECTED. The SARS-CoV-2 RNA is generally detectable in upper and lower respiratory specimens during the acute phase of infection. Negative results do not preclude SARS-CoV-2 infection, do not rule out co-infections with other pathogens, and should not be used as the sole basis for treatment or other patient management decisions. Negative results must be combined with clinical observations, patient history, and epidemiological information. The expected result is Negative. Fact Sheet for Patients: SugarRoll.be Fact Sheet for Healthcare Providers: https://www.woods-mathews.com/ This test is not yet approved or cleared by the [place] FDA and  has been authorized for detection and/or diagnosis of SARS-CoV-2 by FDA under an Emergency Use Authorization (EUA). This EUA will remain  in effect (meaning this test can be used) for the duration of the COVID-19 declaration under Section 56 4(b)(1) of the Act, 21 U.S.C. section 360bbb-3(b)(1), unless the authorization is terminated or revoked sooner. Performed at [facility], [place] [address], [place], [place] [address]     RADIOLOGY:  CT ANGIO CHEST PE W OR WO CONTRAST  Result Date: 01/01/2020 CLINICAL DATA:  63 year old with left sided back pain that radiates down the left arm. History of coronary artery disease. EXAM: CT ANGIOGRAPHY CHEST WITH CONTRAST TECHNIQUE: Multidetector CT imaging of the chest was performed using the standard protocol during bolus administration  of intravenous contrast. Multiplanar CT image reconstructions and MIPs were obtained to evaluate the vascular anatomy. CONTRAST:  46mL OMNIPAQUE IOHEXOL 350 MG/ML SOLN COMPARISON:  Chest CT 09/20/2008 FINDINGS: Cardiovascular: Satisfactory opacification of the pulmonary arteries to the segmental level. No evidence of pulmonary embolism. Normal heart size. No pericardial effusion. Prior CABG procedure. Normal caliber of the thoracic aorta. Although this is not a dedicated aortic examination, there is no gross aortic dissection. Native coronary arteries are calcified and evidence for a coronary artery stent. No significant pericardial effusion. Mediastinum/Nodes: No chest lymphadenopathy. Lungs/Pleura: Trachea and mainstem bronchi are patent. No large pleural effusions. No significant airspace disease or lung consolidation. Upper Abdomen: Images of the upper abdomen are unremarkable. Musculoskeletal: No acute bone abnormality. Review of the MIP images confirms the above findings. IMPRESSION: 1. No acute chest abnormality. Specifically, no evidence for a pulmonary embolism. 2. Coronary artery disease and post CABG. Electronically Signed   By: Markus Daft M.D.   On: 01/01/2020 10:15    Management plans discussed with the patient, family (husband yesterday) and they are in agreement.  CODE STATUS:     Code Status Orders  (From admission, onward)         Start     Ordered   12/31/19 1432  Full code  Continuous     12/31/19 1435        Code Status History    Date Active Date Inactive Code Status Order ID Comments User Context   07/05/2018 0056 07/06/2018 1744 Full Code [id number]  Lance Coon, MD Inpatient   12/10/2016 1617 12/12/2016 2108 Full Code [id number]  Ditty, Kevan Ny, MD Inpatient   Advance Care Planning Activity      TOTAL TIME TAKING CARE OF THIS PATIENT: 35 minutes.    Loletha Grayer M.D on 01/02/2020 at 2:37 PM  Between 7am to 6pm - Pager - [phone]  After 6pm go to  [email or web address] - password [email or web address]  [facility]  CC: Primary care physician; Dr Johny Drilling

## 2020-01-02 NOTE — Plan of Care (Signed)
Patient education given for discharge, care plans met adequate for discharge.

## 2020-02-14 ENCOUNTER — Telehealth: Payer: Self-pay

## 2020-02-14 NOTE — Telephone Encounter (Signed)
pt called states that you called her and she is returning your call and for you to call her back please.

## 2020-02-14 NOTE — Telephone Encounter (Signed)
faxed and confirmed the telephone message to pain clinc.

## 2020-02-14 NOTE — Telephone Encounter (Signed)
pt states she is at the pain clinic and they will not given her her medication because she is on [place] and she needs a not that she is being taking off [place] today. needs to be faxed to [phone] dr. Belenda Cruise crisp at [facility]. needs asap

## 2020-02-14 NOTE — Telephone Encounter (Signed)
Returned call to patient.  She reports she wants to get off of Ambien since she wants to stay on her pain medications.  Discussed with her about medications to help with her sleep.  She will try Remeron 15-22.5 mg at bedtime and could also combine melatonin as needed.  She will make a sooner appointment and she will be reevaluated at that visit.   Will discontinue Ambien.   Discussed with her that we can send the information to her pain provider Dr. Mohammed Kindle at [phone].

## 2020-02-14 NOTE — Telephone Encounter (Signed)
tried to call pt to let her know but no answer and no message could be left voice mail full.

## 2020-02-15 ENCOUNTER — Telehealth (INDEPENDENT_AMBULATORY_CARE_PROVIDER_SITE_OTHER): Admitting: Psychiatry

## 2020-02-15 ENCOUNTER — Encounter: Payer: Self-pay | Admitting: Psychiatry

## 2020-02-15 ENCOUNTER — Other Ambulatory Visit: Payer: Self-pay

## 2020-02-15 DIAGNOSIS — F5105 Insomnia due to other mental disorder: Secondary | ICD-10-CM

## 2020-02-15 DIAGNOSIS — F411 Generalized anxiety disorder: Secondary | ICD-10-CM

## 2020-02-15 DIAGNOSIS — F3176 Bipolar disorder, in full remission, most recent episode depressed: Secondary | ICD-10-CM

## 2020-02-15 MED ORDER — MIRTAZAPINE 15 MG PO TABS: 15.0000 mg | ORAL_TABLET | Freq: Every day | ORAL | 0 refills | Status: DC

## 2020-02-15 NOTE — Progress Notes (Signed)
Provider Location : [facility] Patient Location : Home  Virtual Visit via Video Note  I connected with Jennifer [name] on 02/15/20 at 10:30 AM EDT by a video enabled telemedicine application and verified that I am speaking with the correct person using two identifiers.   I discussed the limitations of evaluation and management by telemedicine and the availability of in person appointments. The patient expressed understanding and agreed to proceed.     I discussed the assessment and treatment plan with the patient. The patient was provided an opportunity to ask questions and all were answered. The patient agreed with the plan and demonstrated an understanding of the instructions.   The patient was advised to call back or seek an in-person evaluation if the symptoms worsen or if the condition fails to improve as anticipated.  [facility] MD OP Progress Note  02/15/2020 10:59 AM Jennifer [name]  MRN:  [id number]  Chief Complaint:  Chief Complaint    Follow-up     HPI: Jennifer [name] is a 64 year old Caucasian female, lives in [place], married, has a history of bipolar disorder, GAD, insomnia, neuroleptic induced dystonia currently resolved was evaluated by telemedicine today.  Patient today reports she is currently struggling with pain.  She reports she wants to get off of Ambien since her pain provider will not prescribe her pain medication without her stopping it.  She reports that it is the only medication that works for her and she would like to stay on it.  She however reports her pain management is more important right now for her.  She reports she is willing to try anything else to manage her sleep right now.  She tried melatonin 10 mg last night however it did not help much.  She reports after an hour or 2 she was up again.  She also reports she has a lot of situational stressors going on.  She reports she had chest pain recently and was admitted to the hospital.  She was told it may have been  stress-induced.  She continues to take her Trileptal for her mood symptoms.  She is also compliant on her Remeron.  She denies any side effects.  She denies any suicidality, homicidality or perceptual disturbances.  Patient denies any other concerns today.   Visit Diagnosis:    ICD-10-CM   1. Bipolar disorder, in full remission, most recent episode depressed ([place])  F31.76   2. Generalized anxiety disorder  F41.1 mirtazapine (REMERON) 15 MG tablet  3. Insomnia due to mental condition  F51.05 mirtazapine (REMERON) 15 MG tablet    Past Psychiatric History: I have reviewed past psychiatric history from my progress note on 02/05/2018. Past Medical History:  Past Medical History:  Diagnosis Date  . Anemia 1985   bled during a cervical biopsy, led to bleed transfusion   . Anginal pain ([place])   . Anxiety   . Arthritis    pt. reports that her neck is stiff   . Bipolar depression ([place])   . Blood dyscrasia    reported hx [name] '85; saw hematologist Dr. Janese Banks 10/2016, VWD work-up WNL, not felt to have a primary bleeding disorder   . CAD (coronary artery disease)   . 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 COPD (chronic obstructive pulmonary disease) ([place])    dr. Gar [facility]onto [facility]    PCP  . CRD (chronic renal disease)   .  Depression   . Depression with anxiety   . Diabetes mellitus   . Dyspnea    W/ EXERTION   . Enlarged liver   . Family history of adverse reaction to anesthesia    father woke up slowly  . Fibromyalgia   . Generalized anxiety disorder   . GERD (gastroesophageal reflux disease)   . Hyperlipidemia   . Hypertension   . Hypothyroidism   . Low back pain   . Low sodium levels   . Migraines   . Neuropathy   . Neuropathy   . Persistent disorder of initiating or maintaining sleep   . Restless leg   . Von Willebrand disease ([place])     Past Surgical History:  Procedure Laterality Date  . ABDOMINAL HYSTERECTOMY    .  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 APPLICATION OF ROBOTIC ASSISTANCE FOR SPINAL PROCEDURE N/A 12/10/2016   Procedure: APPLICATION OF ROBOTIC ASSISTANCE FOR SPINAL PROCEDURE;  Surgeon: Kevan Ny Ditty, MD;  Location: [facility];  Service: Neurosurgery;  Laterality: N/A;  . BACK SURGERY  2007   lumbar  . BREAST IMPLANT REMOVAL    . CARDIAC CATHETERIZATION     CAD,PCI of LAD, Cypher stent 2.5-28mm  . CARDIAC CATHETERIZATION     PCI of mid-LAD in stent restenosis with a drug-eluting stent  . CARDIAC CATHETERIZATION  06/03/13   [facility];MID LAD 100 % STENOSIS; PRIOR STENT; MID RCA 40 % STENOSIS  . COLONOSCOPY WITH PROPOFOL N/A 08/13/2018   Procedure: COLONOSCOPY WITH PROPOFOL;  Surgeon: [name], MD;  Location[facility];  Service: Gastroenterology;  Laterality: N/A;  . CORONARY ARTERY BYPASS GRAFT   08-12-2011   CABG x 3  . CORONARY STENT PLACEMENT    . ESOPHAGOGASTRODUODENOSCOPY (EGD) WITH PROPOFOL N/A 08/13/2018   Procedure: ESOPHAGOGASTRODUODENOSCOPY (EGD) WITH PROPOFOL;  Surgeon: [name], MD;  Location: [facility] ENDOSCOPY;  Service: Gastroenterology;  Laterality: N/A;  . LUMBAR FUSION     L1-S1  . LUMBAR PERCUTANEOUS PEDICLE SCREW 1 LEVEL N/A 12/10/2016   Procedure: Extension of pedicle screw fixation to LUMBAR FOUR;  Surgeon: Kevan Ny Ditty, MD;  Location: [facility];  Service: Neurosurgery;  Laterality: N/A;  . NASAL SINUS SURGERY  1997   tuirbinate reduction   . RECTAL EXAM UNDER ANESTHESIA N/A 10/06/2018   Procedure: RECTAL EXAM UNDER ANESTHESIA;  Surgeon: Jules Husbands, MD;   Location: [facility] ORS;  Service: General;  Laterality: N/A;    Family Psychiatric History: I have reviewed family psychiatric history from my progress note on 02/05/2018  Family History:  Family History  Problem Relation Age of Onset  . Aneurysm Father        abdominal  . Diabetes Father   . Alcohol abuse Father   . Hypertension Father   . Aneurysm Brother        abdominal  . Alcohol abuse Brother   . Hypertension Brother   . Colon cancer Brother   . Diabetes Mother   . Alcohol abuse Mother   . Hypertension Other        family hx  . Hyperlipidemia Other        family hx  .  Diabetes Other        family hx    Social History: Reviewed social history from my progress note on 02/05/2018. Social History   Socioeconomic History  . Marital status: Married    Spouse name: Photographer  . Number of children: 3  . Years of education: Not on file  . Highest education level: Associate degree: occupational, Hotel manager, or vocational program  Occupational History  . Not on file  Tobacco Use  . Smoking status: Former Smoker    Years: 16.00    Types: Cigarettes    Quit date: 06/20/2010    Years since quitting: 9.6  . Smokeless tobacco: Never Used  Substance and Sexual Activity  . Alcohol use: No    Alcohol/week: 0.0 standard drinks  . Drug use: No  . Sexual activity: Yes  Other Topics Concern  . Not on file  Social History Narrative  . Not on file   Social Determinants of Health   Financial Resource Strain:   . Difficulty of Paying Living Expenses:   Food Insecurity:   . Worried About Charity fundraiser in the Last Year:   . Arboriculturist in the Last Year:   Transportation Needs:   . Film/video editor (Medical):   Marland Kitchen Lack of Transportation (Non-Medical):   Physical Activity:   . Days of Exercise per Week:   . Minutes of Exercise per Session:   Stress:   . Feeling of Stress :   Social Connections:   . Frequency of Communication with Friends and Family:   . Frequency  of Social Gatherings with Friends and Family:   . Attends Religious Services:   . Active Member of Clubs or Organizations:   . Attends Archivist Meetings:   Marland Kitchen Marital Status:     Allergies: No Known Allergies  Metabolic Disorder Labs: Lab Results  Component Value Date   HGBA1C 7.2 (H) 01/01/2020   MPG 159.94 01/01/2020   MPG 223.08 12/05/2017   Lab Results  Component Value Date   PROLACTIN 6.1 12/05/2017   Lab Results  Component Value Date   CHOL 238 (H) 12/05/2017   TRIG 819 (H) 12/05/2017   HDL 41 12/05/2017   CHOLHDL 5.8 12/05/2017   VLDL UNABLE TO CALCULATE IF TRIGLYCERIDE OVER 400 mg/dL 12/05/2017   LDLCALC UNABLE TO CALCULATE IF TRIGLYCERIDE OVER 400 mg/dL 12/05/2017   LDLCALC SEE COMMENT 07/09/2012   Lab Results  Component Value Date   TSH 2.600 08/03/2018   TSH 86.000 (H) 12/05/2017    Therapeutic Level Labs: No results found for: LITHIUM No results found for: VALPROATE No components found for:  CBMZ  Current Medications: Current Outpatient Medications  Medication Sig Dispense Refill  . albuterol-ipratropium (COMBIVENT) 18-103 MCG/ACT inhaler Inhale 2 puffs into the lungs daily as needed for wheezing or shortness of breath.     Marland Kitchen amLODipine (NORVASC) 10 MG tablet Take 10 mg by mouth in the morning and at bedtime.     Marland Kitchen atorvastatin (LIPITOR) 80 MG tablet Take 1 tablet (80 mg total) by mouth daily. 90 tablet 3  . carisoprodol (SOMA) 350 MG tablet Take 1 tablet (350 mg total) by mouth 2 (two) times daily as needed for muscle spasms.    . carvedilol (COREG) 12.5 MG tablet Take 1 tablet (12.5 mg total) by mouth 2 (two) times daily with a meal. 180 tablet 3  . DETROL LA 4 MG 24 hr capsule Take 4 mg by mouth daily.    Marland Kitchen  estradiol (ESTRING) 2 MG vaginal ring Place vaginally.    . fluticasone (FLONASE) 50 MCG/ACT nasal spray Place 1 spray into both nostrils daily as needed.     . fluticasone (FLOVENT HFA) 110 MCG/ACT inhaler Inhale 1 puff into the lungs  daily as needed.     . furosemide (LASIX) 20 MG tablet Take 1 tablet (20 mg total) by mouth daily. 30 tablet 0  . HUMULIN 70/30 KWIKPEN (70-30) 100 UNIT/ML PEN Inject 10-15 Units into the skin See admin instructions. Inject 10-15 units SQ in the morning and inject 10-12 units SQ at bedtime per sliding scale    . ipratropium (ATROVENT) 0.06 % nasal spray USE 2 SPRAY(S) IN EACH NOSTRIL THREE TIMES DAILY FOR 14 DAYS    . mirtazapine (REMERON) 15 MG tablet Take 1 tablet (15 mg total) by mouth at bedtime. 90 tablet 0  . nitroGLYCERIN (NITROSTAT) 0.4 MG SL tablet Place 1 tablet (0.4 mg total) under the tongue every 5 (five) minutes as needed for chest pain. 25 tablet 6  . Oxcarbazepine (TRILEPTAL) 300 MG tablet Take 3 tablets (900 mg total) by mouth at bedtime. 270 tablet 1  . Oxycodone HCl 10 MG TABS LIMIT ONE HALF TO ONE TABLET BY MOUTH FOUR TO SIX TIMES PER DAY IF TOLERATED (NO HYDROCODONE ACETAMINOPHEN)    . potassium chloride (KLOR-CON) 10 MEQ tablet Take 1 tablet (10 mEq total) by mouth daily. 30 tablet 0  . potassium chloride (KLOR-CON) 10 MEQ tablet Take 10 mEq by mouth daily.    . RESTASIS 0.05 % ophthalmic emulsion Place 1 drop into both eyes daily as needed (for dryness).     Marland Kitchen rOPINIRole (REQUIP) 0.5 MG tablet Take 1 mg by mouth at bedtime.     . sitaGLIPtin (JANUVIA) 100 MG tablet Take 100 mg by mouth daily.    . sodium chloride 1 g tablet Take 1 tablet (1 g total) by mouth 3 (three) times daily with meals. 90 tablet 0  . SYNTHROID 25 MCG tablet Take 25 mcg by mouth daily before breakfast.     . telmisartan (MICARDIS) 80 MG tablet Take 80 mg by mouth daily.     No current facility-administered medications for this visit.     Musculoskeletal: Strength & Muscle Tone: UTA Gait & Station: normal Patient leans: N/A  Psychiatric Specialty Exam: Review of Systems  Musculoskeletal: Positive for back pain.  Psychiatric/Behavioral: Positive for sleep disturbance. The patient is  nervous/anxious.   All other systems reviewed and are negative.   There were no vitals taken for this visit.There is no height or weight on file to calculate BMI.  General Appearance: Casual  Eye Contact:  Fair  Speech:  Clear and Coherent  Volume:  Normal  Mood:  Anxious  Affect:  Appropriate  Thought Process:  Goal Directed and Descriptions of Associations: Intact  Orientation:  Full (Time, Place, and Person)  Thought Content: Logical   Suicidal Thoughts:  No  Homicidal Thoughts:  No  Memory:  Immediate;   Fair Recent;   Fair Remote;   Fair  Judgement:  Fair  Insight:  Fair  Psychomotor Activity:  Normal  Concentration:  Concentration: Fair and Attention Span: Fair  Recall:  [employer] of Knowledge: Fair  Language: Fair  Akathisia:  No  Handed:  Right  AIMS (if indicated): UTA  Assets:  Communication Skills Desire for Improvement Housing Social Support  ADL's:  Intact  Cognition: WNL  Sleep:  Poor   Screenings: AIMS  Office Visit from 12/23/2018 in [facility] Office Visit from 08/18/2018 in [facility] Total Score  5  6    PHQ2-9     Office Visit from 05/09/2016 in [facility] Procedure visit from 03/20/2016 in [facility] Office Visit from 03/12/2016 in [facility] Clinical Support from 02/13/2016 in [facility] Office Visit from 12/14/2015 in [facility]  PHQ-2 Total Score  0  0  0  0  0       Assessment and Plan: Jennifer [name] is a 64 year old Caucasian female who has a history of bipolar disorder, anxiety disorder was evaluated by telemedicine today.  Patient is currently struggling with anxiety symptoms, sleep issues as well as pain.  Patient will benefit from medication readjustment.  Plan as  noted below.  Plan Bipolar disorder in remission Trileptal 900 mg p.o. daily  Anxiety disorder-unstable Continue Trileptal 900 mg p.o. daily for now. Continue Remeron as prescribed. Her anxiety symptoms are currently more so because of her sleep issues.  We will make the following changes with sleep medication and continue to reevaluate.  Insomnia-unstable Discontinue Ambien. Reduce mirtazapine to 15 mg p.o. nightly Start melatonin 10 to 15 mg p.o. nightly. Discussed sleep hygiene techniques Discussed with patient to set a bedtime routine, set at bedtime and wake up time, relaxation techniques, reduce the amount of food intake towards the end of the day, cut back on caffeine .  Follow-up in clinic in 2 weeks or sooner if needed.  I have spent atleast 20 minutes non face to face with patient today. More than 50 % of the time was spent for preparing to see the patient ( e.g., review of test, records ), ordering medications and test ,psychoeducation and supportive psychotherapy and care coordination,as well as documenting clinical information in electronic health record. This note was generated in part or whole with voice recognition software. Voice recognition is usually quite accurate but there are transcription errors that can and very often do occur. I apologize for any typographical errors that were not detected and corrected.        Ursula Alert, MD 02/15/2020, 10:59 AM

## 2020-02-15 NOTE — Patient Instructions (Signed)
Follow-up in clinic on May 5 at 9:15 AM

## 2020-03-01 ENCOUNTER — Encounter: Payer: Self-pay | Admitting: Psychiatry

## 2020-03-01 ENCOUNTER — Telehealth (INDEPENDENT_AMBULATORY_CARE_PROVIDER_SITE_OTHER): Payer: Medicare Other | Admitting: Psychiatry

## 2020-03-01 ENCOUNTER — Other Ambulatory Visit: Payer: Self-pay

## 2020-03-01 DIAGNOSIS — F3176 Bipolar disorder, in full remission, most recent episode depressed: Secondary | ICD-10-CM

## 2020-03-01 DIAGNOSIS — G4701 Insomnia due to medical condition: Secondary | ICD-10-CM | POA: Diagnosis not present

## 2020-03-01 DIAGNOSIS — F411 Generalized anxiety disorder: Secondary | ICD-10-CM

## 2020-03-01 MED ORDER — NORTRIPTYLINE HCL 25 MG PO CAPS: 25.0000 mg | ORAL_CAPSULE | Freq: Every day | ORAL | 0 refills | Status: DC

## 2020-03-01 NOTE — Progress Notes (Signed)
Provider Location : [facility] Patient Location : Home  Virtual Visit via Video Note  I connected with Jennifer [name] on 03/01/20 at  9:15 AM EDT by a video enabled telemedicine application and verified that I am speaking with the correct person using two identifiers.   I discussed the limitations of evaluation and management by telemedicine and the availability of in person appointments. The patient expressed understanding and agreed to proceed.      I discussed the assessment and treatment plan with the patient. The patient was provided an opportunity to ask questions and all were answered. The patient agreed with the plan and demonstrated an understanding of the instructions.   The patient was advised to call back or seek an in-person evaluation if the symptoms worsen or if the condition fails to improve as anticipated.   [facility] MD OP Progress Note  03/01/2020 12:45 PM Jennifer [name]  MRN:  [id number]  Chief Complaint:  Chief Complaint    Follow-up     HPI: Jennifer [name] is a 64 year old Caucasian female, lives in [place], married, has a history of bipolar disorder, GAD, insomnia, neuroleptic induced dystonia currently resolved, chronic pain was evaluated by telemedicine today.  Patient today reports she continues to struggle with pain.  She reports she is currently on lower dosage of pain medications as well as muscle relaxant and that affects her mood as well as sleep.  She reports she has been unable to sleep on the Remeron and the melatonin.  Patient continues to have several psychosocial stressors including relationship struggles with her husband, her own health issues.  Patient denies any suicidality, homicidality or perceptual disturbances.  Patient denies any other concerns today. Visit Diagnosis:    ICD-10-CM   1. Bipolar disorder, in full remission, most recent episode depressed ([place])  F31.76   2. Generalized anxiety disorder  F41.1 nortriptyline (PAMELOR) 25 MG capsule  3.  Insomnia due to medical condition  G47.01 nortriptyline (PAMELOR) 25 MG capsule    Past Psychiatric History: I have reviewed past psychiatric history from my progress note on 02/05/2018.  Past Medical History:  Past Medical History:  Diagnosis Date  . Anemia 1985   bled during a cervical biopsy, led to bleed transfusion   . Anginal pain ([place])   . Anxiety   . Arthritis    pt. reports that her neck is stiff   . Bipolar depression ([place])   . Blood dyscrasia    reported hx [name] '85; saw hematologist Dr. Janese Banks 10/2016, VWD work-up WNL, not felt to have a primary bleeding disorder   . CAD (coronary artery disease)   . 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 COPD (chronic obstructive pulmonary disease) ([place])    [facility]. Gar [facility]    PCP  . CRD (chronic renal disease)   . Depression   . Depression with anxiety   . Diabetes mellitus   . Dyspnea    W/ EXERTION   . Enlarged liver   . Family history of adverse reaction to anesthesia    father woke up slowly  . Fibromyalgia   . Generalized anxiety disorder   . GERD (gastroesophageal reflux disease)   . Hyperlipidemia   . Hypertension   . Hypothyroidism   . Low back pain   . Low sodium levels   . Migraines   . Neuropathy   . Neuropathy   . Persistent  disorder of initiating or maintaining sleep   . Restless leg   . Von Willebrand disease ([place])     Past Surgical History:  Procedure Laterality Date  . ABDOMINAL HYSTERECTOMY    . 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 APPLICATION OF  ROBOTIC ASSISTANCE FOR SPINAL PROCEDURE N/A 12/10/2016   Procedure: APPLICATION OF ROBOTIC ASSISTANCE FOR SPINAL PROCEDURE;  Surgeon: Kevan Ny Ditty, MD;  Location: [facility];  Service: Neurosurgery;  Laterality: N/A;  . BACK SURGERY  2007   lumbar  . BREAST IMPLANT REMOVAL    . CARDIAC CATHETERIZATION     CAD,PCI of LAD, Cypher stent 2.5-28mm  . CARDIAC CATHETERIZATION     PCI of mid-LAD in stent restenosis with a drug-eluting stent  . CARDIAC CATHETERIZATION  06/03/13   [facility];MID LAD 100 % STENOSIS; PRIOR STENT; MID RCA 40 % STENOSIS  . COLONOSCOPY WITH PROPOFOL N/A 08/13/2018   Procedure: COLONOSCOPY WITH PROPOFOL;  Surgeon: [name], MD;  Location: [facility];  Service: Gastroenterology;  Laterality: N/A;  . CORONARY ARTERY BYPASS GRAFT   08-12-2011   CABG x 3  . CORONARY STENT PLACEMENT    . ESOPHAGOGASTRODUODENOSCOPY (EGD) WITH PROPOFOL N/A 08/13/2018   Procedure: ESOPHAGOGASTRODUODENOSCOPY (EGD) WITH PROPOFOL;  Surgeon: [name], MD;  Location[facility];  Service: Gastroenterology;  Laterality: N/A;  . LUMBAR FUSION     L1-S1  . LUMBAR PERCUTANEOUS PEDICLE SCREW 1 LEVEL N/A 12/10/2016   Procedure: Extension of pedicle screw fixation to LUMBAR FOUR;  Surgeon: Kevan Ny Ditty, MD;  Location: [facility];  Service: Neurosurgery;  Laterality: N/A;  . NASAL SINUS SURGERY  1997   tuirbinate reduction   . RECTAL EXAM UNDER ANESTHESIA N/A 10/06/2018   Procedure: RECTAL EXAM UNDER ANESTHESIA;  Surgeon: Jules Husbands, MD;  Location: [facility] ORS;  Service: General;  Laterality: N/A;    Family Psychiatric History: I have reviewed family psychiatric history from my progress note on 02/05/2018.  Family History:  Family History  Problem Relation Age of Onset  . Aneurysm Father        abdominal  . Diabetes Father   . Alcohol abuse Father   . Hypertension Father   . Aneurysm Brother        abdominal  . Alcohol abuse Brother   . Hypertension Brother   . Colon cancer Brother    . Diabetes Mother   . Alcohol abuse Mother   . Hypertension Other        family hx  . Hyperlipidemia Other        family hx  . Diabetes Other        family hx    Social History: I have reviewed social history from my progress note on 02/05/2018. Social History   Socioeconomic History  . Marital status: Married    Spouse name: Photographer  . Number of children: 3  . Years of education: Not on file  . Highest education level: Associate degree: occupational, Hotel manager, or vocational program  Occupational History  . Not on file  Tobacco Use  . Smoking status: Former Smoker    Years: 16.00    Types: Cigarettes  Quit date: 06/20/2010    Years since quitting: 9.7  . Smokeless tobacco: Never Used  Substance and Sexual Activity  . Alcohol use: No    Alcohol/week: 0.0 standard drinks  . Drug use: No  . Sexual activity: Yes  Other Topics Concern  . Not on file  Social History Narrative  . Not on file   Social Determinants of Health   Financial Resource Strain:   . Difficulty of Paying Living Expenses:   Food Insecurity:   . Worried About Charity fundraiser in the Last Year:   . Arboriculturist in the Last Year:   Transportation Needs:   . Film/video editor (Medical):   Marland Kitchen Lack of Transportation (Non-Medical):   Physical Activity:   . Days of Exercise per Week:   . Minutes of Exercise per Session:   Stress:   . Feeling of Stress :   Social Connections:   . Frequency of Communication with Friends and Family:   . Frequency of Social Gatherings with Friends and Family:   . Attends Religious Services:   . Active Member of Clubs or Organizations:   . Attends Archivist Meetings:   Marland Kitchen Marital Status:     Allergies: No Known Allergies  Metabolic Disorder Labs: Lab Results  Component Value Date   HGBA1C 7.2 (H) 01/01/2020   MPG 159.94 01/01/2020   MPG 223.08 12/05/2017   Lab Results  Component Value Date   PROLACTIN 6.1 12/05/2017   Lab Results   Component Value Date   CHOL 238 (H) 12/05/2017   TRIG 819 (H) 12/05/2017   HDL 41 12/05/2017   CHOLHDL 5.8 12/05/2017   VLDL UNABLE TO CALCULATE IF TRIGLYCERIDE OVER 400 mg/dL 12/05/2017   LDLCALC UNABLE TO CALCULATE IF TRIGLYCERIDE OVER 400 mg/dL 12/05/2017   LDLCALC SEE COMMENT 07/09/2012   Lab Results  Component Value Date   TSH 2.600 08/03/2018   TSH 86.000 (H) 12/05/2017    Therapeutic Level Labs: No results found for: LITHIUM No results found for: VALPROATE No components found for:  CBMZ  Current Medications: Current Outpatient Medications  Medication Sig Dispense Refill  . albuterol-ipratropium (COMBIVENT) 18-103 MCG/ACT inhaler Inhale 2 puffs into the lungs daily as needed for wheezing or shortness of breath.     Marland Kitchen amLODipine (NORVASC) 10 MG tablet Take 10 mg by mouth in the morning and at bedtime.     Marland Kitchen atorvastatin (LIPITOR) 80 MG tablet Take 1 tablet (80 mg total) by mouth daily. 90 tablet 3  . carisoprodol (SOMA) 350 MG tablet Take 1 tablet (350 mg total) by mouth 2 (two) times daily as needed for muscle spasms.    . carvedilol (COREG) 12.5 MG tablet Take 1 tablet (12.5 mg total) by mouth 2 (two) times daily with a meal. 180 tablet 3  . DETROL LA 4 MG 24 hr capsule Take 4 mg by mouth daily.    Marland Kitchen estradiol (ESTRING) 2 MG vaginal ring Place vaginally.    . fluticasone (FLONASE) 50 MCG/ACT nasal spray Place 1 spray into both nostrils daily as needed.     . fluticasone (FLOVENT HFA) 110 MCG/ACT inhaler Inhale 1 puff into the lungs daily as needed.     . furosemide (LASIX) 20 MG tablet Take 1 tablet (20 mg total) by mouth daily. 30 tablet 0  . HUMULIN 70/30 KWIKPEN (70-30) 100 UNIT/ML PEN Inject 10-15 Units into the skin See admin instructions. Inject 10-15 units SQ in the morning and inject  10-12 units SQ at bedtime per sliding scale    . ipratropium (ATROVENT) 0.06 % nasal spray USE 2 SPRAY(S) IN EACH NOSTRIL THREE TIMES DAILY FOR 14 DAYS    . nitroGLYCERIN (NITROSTAT)  0.4 MG SL tablet Place 1 tablet (0.4 mg total) under the tongue every 5 (five) minutes as needed for chest pain. 25 tablet 6  . nortriptyline (PAMELOR) 25 MG capsule Take 1 capsule (25 mg total) by mouth at bedtime. 30 capsule 0  . Oxcarbazepine (TRILEPTAL) 300 MG tablet Take 3 tablets (900 mg total) by mouth at bedtime. 270 tablet 1  . Oxycodone HCl 10 MG TABS LIMIT ONE HALF TO ONE TABLET BY MOUTH FOUR TO SIX TIMES PER DAY IF TOLERATED (NO HYDROCODONE ACETAMINOPHEN)    . potassium chloride (KLOR-CON) 10 MEQ tablet Take 1 tablet (10 mEq total) by mouth daily. 30 tablet 0  . potassium chloride (KLOR-CON) 10 MEQ tablet Take 10 mEq by mouth daily.    . RESTASIS 0.05 % ophthalmic emulsion Place 1 drop into both eyes daily as needed (for dryness).     Marland Kitchen rOPINIRole (REQUIP) 0.5 MG tablet Take 1 mg by mouth at bedtime.     . sitaGLIPtin (JANUVIA) 100 MG tablet Take 100 mg by mouth daily.    . sodium chloride 1 g tablet Take 1 tablet (1 g total) by mouth 3 (three) times daily with meals. 90 tablet 0  . SYNTHROID 25 MCG tablet Take 25 mcg by mouth daily before breakfast.     . telmisartan (MICARDIS) 80 MG tablet Take 80 mg by mouth daily.     No current facility-administered medications for this visit.     Musculoskeletal: Strength & Muscle Tone: UTA Gait & Station: normal Patient leans: N/A  Psychiatric Specialty Exam: Review of Systems  Musculoskeletal: Positive for back pain.  Psychiatric/Behavioral: Positive for sleep disturbance. The patient is nervous/anxious.   All other systems reviewed and are negative.   There were no vitals taken for this visit.There is no height or weight on file to calculate BMI.  General Appearance: Casual  Eye Contact:  Fair  Speech:  Clear and Coherent  Volume:  Normal  Mood:  Anxious  Affect:  Congruent  Thought Process:  Goal Directed and Descriptions of Associations: Intact  Orientation:  Full (Time, Place, and Person)  Thought Content: Logical    Suicidal Thoughts:  No  Homicidal Thoughts:  No  Memory:  Immediate;   Fair Recent;   Fair Remote;   Fair  Judgement:  Fair  Insight:  Fair  Psychomotor Activity:  Normal  Concentration:  Concentration: Fair and Attention Span: Fair  Recall:  [employer] of Knowledge: Fair  Language: Fair  Akathisia:  No  Handed:  Right  AIMS (if indicated): UTA  Assets:  Communication Skills Desire for Improvement Housing Social Support  ADL's:  Intact  Cognition: WNL  Sleep:  Poor   Screenings: AIMS     Office Visit from 12/23/2018 in [facility] Office Visit from 08/18/2018 in [facility] Total Score  5  6    PHQ2-9     Office Visit from 05/09/2016 in [facility] Procedure visit from 03/20/2016 in [facility] Office Visit from 03/12/2016 in [facility] Clinical Support from 02/13/2016 in [facility] Office Visit from 12/14/2015 in [facility]  PHQ-2 Total Score  0  0  0  0  0       Assessment and Plan: Jennifer [name] is a 64 year old Caucasian female who has a history of bipolar disorder, anxiety disorder was evaluated by telemedicine today.  Patient continues to struggle with anxiety, sleep.  Her sleep problems are also due to her pain which is currently not under control.  Discussed plan as noted below.  Plan Bipolar disorder in remission Trileptal 900 mg p.o. daily  Generalized anxiety disorder-unstable Trileptal as prescribed Discontinue Remeron. Discussed referral for CBT-she declined. Start Pamelor 25 mg p.o. nightly  Insomnia-unstable Discontinue Remeron for lack of benefit Start Pamelor 25 mg p.o. nightly Discussed sleep hygiene techniques  Follow-up in clinic in 2 to 3 weeks or sooner if needed.  I have  spent atleast 20 minutes non face to face with patient today. More than 50 % of the time was spent for preparing to see the patient ( e.g., review of test, records ),  ordering medications and test ,psychoeducation and supportive psychotherapy and care coordination,as well as documenting clinical information in electronic health record. This note was generated in part or whole with voice recognition software. Voice recognition is usually quite accurate but there are transcription errors that can and very often do occur. I apologize for any typographical errors that were not detected and corrected.       Ursula Alert, MD 03/01/2020, 12:45 PM

## 2020-03-21 ENCOUNTER — Other Ambulatory Visit: Payer: Self-pay | Admitting: Cardiovascular Disease

## 2020-03-23 ENCOUNTER — Telehealth: Admitting: Psychiatry

## 2020-03-24 ENCOUNTER — Telehealth (INDEPENDENT_AMBULATORY_CARE_PROVIDER_SITE_OTHER): Payer: Medicare Other | Admitting: Psychiatry

## 2020-03-24 ENCOUNTER — Other Ambulatory Visit: Payer: Self-pay

## 2020-03-24 ENCOUNTER — Encounter: Payer: Self-pay | Admitting: Psychiatry

## 2020-03-24 DIAGNOSIS — F411 Generalized anxiety disorder: Secondary | ICD-10-CM | POA: Diagnosis not present

## 2020-03-24 DIAGNOSIS — F3176 Bipolar disorder, in full remission, most recent episode depressed: Secondary | ICD-10-CM

## 2020-03-24 DIAGNOSIS — G4701 Insomnia due to medical condition: Secondary | ICD-10-CM

## 2020-03-24 MED ORDER — NORTRIPTYLINE HCL 25 MG PO CAPS: 50.0000 mg | ORAL_CAPSULE | Freq: Every day | ORAL | 1 refills | Status: DC

## 2020-03-24 NOTE — Progress Notes (Signed)
Provider Location : [facility] Patient Location : Home  Virtual Visit via Video Note  I connected with Jennifer [name] on 03/24/20 at 10:40 AM EDT by a video enabled telemedicine application and verified that I am speaking with the correct person using two identifiers.   I discussed the limitations of evaluation and management by telemedicine and the availability of in person appointments. The patient expressed understanding and agreed to proceed.   I discussed the assessment and treatment plan with the patient. The patient was provided an opportunity to ask questions and all were answered. The patient agreed with the plan and demonstrated an understanding of the instructions.   The patient was advised to call back or seek an in-person evaluation if the symptoms worsen or if the condition fails to improve as anticipated.  [place] MD OP Progress Note  03/24/2020 11:01 AM Jennifer [name]  MRN:  [id number]  Chief Complaint:  Chief Complaint    Follow-up     [id number] P Weckwerth is 64 year old Caucasian female, lives in [place], married, has a history of bipolar disorder, GAD, insomnia, neuroleptic induced dystonia, chronic pain was evaluated by telemedicine today.  Patient today reports she is currently making progress with regards to her anxiety. She denies any mood swings.  She reports she however continues to struggle with sleep.  She reports she wakes up every hour and is unable to sleep through the night.  The Pamelor at this dosage is not beneficial.  She denies side effects.  Patient reports her pain is currently more under control.  Patient denies any suicidality, homicidality or perceptual disturbances.  She reports her husband and her son are currently getting along well and that does help her to feel better.  Patient denies any other concerns today.      Visit Diagnosis:    ICD-10-CM   1. Bipolar disorder, in full remission, most recent episode depressed ([place])  F31.76   2.  Generalized anxiety disorder  F41.1 nortriptyline (PAMELOR) 25 MG capsule  3. Insomnia due to medical condition  G47.01 nortriptyline (PAMELOR) 25 MG capsule    Past Psychiatric History: I have reviewed past psychiatric history from my progress note on 02/05/2018.  Past Medical History:  Past Medical History:  Diagnosis Date  . Anemia 1985   bled during a cervical biopsy, led to bleed transfusion   . Anginal pain ([place])   . Anxiety   . Arthritis    pt. reports that her neck is stiff   . Bipolar depression ([place])   . Blood dyscrasia    reported hx [name] '85; saw hematologist Dr. Janese Banks 10/2016, VWD work-up WNL, not felt to have a primary bleeding disorder   . CAD (coronary artery disease)   . 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 COPD (chronic obstructive pulmonary disease) ([place])    dr. Gar [facility]    PCP  . CRD (chronic renal disease)   . Depression   . Depression with anxiety   . Diabetes mellitus   . Dyspnea    W/ EXERTION   . Enlarged liver   . Family history of adverse reaction to anesthesia    father woke up slowly  . Fibromyalgia   . Generalized anxiety disorder   . GERD (gastroesophageal reflux disease)   . Hyperlipidemia   . Hypertension   . Hypothyroidism   . Low back pain   .  Low sodium levels   . Migraines   . Neuropathy   . Neuropathy   . Persistent disorder of initiating or maintaining sleep   . Restless leg   . Von Willebrand disease ([place])     Past Surgical History:  Procedure Laterality Date  . ABDOMINAL HYSTERECTOMY    . 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 APPLICATION OF ROBOTIC ASSISTANCE FOR SPINAL PROCEDURE N/A 12/10/2016   Procedure: APPLICATION OF ROBOTIC ASSISTANCE FOR SPINAL PROCEDURE;  Surgeon: Kevan Ny Ditty, MD;  Location: [place] [facility];  Service: Neurosurgery;  Laterality: N/A;  . BACK SURGERY  2007   lumbar  . BREAST IMPLANT REMOVAL    . CARDIAC CATHETERIZATION     CAD,PCI of LAD, Cypher stent 2.5-28mm  . CARDIAC CATHETERIZATION     PCI of mid-LAD in stent restenosis with a drug-eluting stent  . CARDIAC CATHETERIZATION  06/03/13   [facility];MID LAD 100 % STENOSIS; PRIOR STENT; MID RCA 40 % STENOSIS  . COLONOSCOPY WITH PROPOFOL N/A 08/13/2018   Procedure: COLONOSCOPY WITH PROPOFOL;  Surgeon: [name], MD;  Location: [facility] ENDOSCOPY;  Service: Gastroenterology;  Laterality: N/A;  . CORONARY ARTERY BYPASS GRAFT   08-12-2011   CABG x 3  . CORONARY STENT PLACEMENT    . ESOPHAGOGASTRODUODENOSCOPY (EGD) WITH PROPOFOL N/A 08/13/2018   Procedure: ESOPHAGOGASTRODUODENOSCOPY (EGD) WITH PROPOFOL;  Surgeon: [name], MD;  Location: [facility] ENDOSCOPY;  Service: Gastroenterology;  Laterality: N/A;  . LUMBAR FUSION     L1-S1  . LUMBAR PERCUTANEOUS PEDICLE SCREW 1 LEVEL N/A 12/10/2016   Procedure: Extension of pedicle screw fixation to LUMBAR FOUR;  Surgeon: Kevan Ny Ditty, MD;  Location: [place];  Service: Neurosurgery;  Laterality: N/A;  . NASAL SINUS SURGERY  1997   tuirbinate reduction   . RECTAL EXAM UNDER ANESTHESIA N/A 10/06/2018   Procedure: RECTAL EXAM UNDER ANESTHESIA;  Surgeon: Jules Husbands, MD;  Location: [facility] ORS;  Service: General;  Laterality: N/A;    Family Psychiatric History: I have reviewed family psychiatric history from my progress note on 02/05/2018.  Family History:  Family History  Problem Relation Age of Onset  . Aneurysm Father        abdominal  . Diabetes Father   . Alcohol abuse Father   . Hypertension Father   . Aneurysm Brother        abdominal   . Alcohol abuse Brother   . Hypertension Brother   . Colon cancer Brother   . Diabetes Mother   . Alcohol abuse Mother   . Hypertension Other        family hx  . Hyperlipidemia Other        family hx  . Diabetes Other        family hx    Social History: I have reviewed social history from my progress note on 02/05/2018. Social History   Socioeconomic History  . Marital status: Married    Spouse name: Photographer  . Number of children: 3  . Years of education: Not on file  . Highest education level: Associate degree: occupational, Hotel manager, or vocational program  Occupational History  . Not on file  Tobacco Use  . Smoking status: Former Smoker    Years: 16.00    Types: Cigarettes    Quit date: 06/20/2010    Years since quitting: 9.7  . Smokeless tobacco: Never Used  Substance and Sexual Activity  . Alcohol use: No    Alcohol/week: 0.0 standard drinks  . Drug use: No  . Sexual activity: Yes  Other Topics Concern  . Not on file  Social History Narrative  . Not on file   Social Determinants of Health   Financial Resource Strain:   . Difficulty of Paying Living Expenses:   Food Insecurity:   . Worried About Charity fundraiser in the Last Year:   . Arboriculturist in the Last Year:   Transportation Needs:   . Film/video editor (Medical):   Marland Kitchen Lack of Transportation (Non-Medical):   Physical Activity:   . Days of Exercise per Week:   . Minutes of Exercise per Session:   Stress:   . Feeling of Stress :   Social Connections:   . Frequency of Communication with Friends and Family:   . Frequency of Social Gatherings with Friends and Family:   . Attends Religious Services:   . Active Member of Clubs or Organizations:   . Attends Archivist Meetings:   Marland Kitchen Marital Status:     Allergies: No Known Allergies  Metabolic Disorder Labs: Lab Results  Component Value Date   HGBA1C 7.2 (H) 01/01/2020   MPG 159.94 01/01/2020   MPG 223.08 12/05/2017   Lab  Results  Component Value Date   PROLACTIN 6.1 12/05/2017   Lab Results  Component Value Date   CHOL 238 (H) 12/05/2017   TRIG 819 (H) 12/05/2017   HDL 41 12/05/2017   CHOLHDL 5.8 12/05/2017   VLDL UNABLE TO CALCULATE IF TRIGLYCERIDE OVER 400 mg/dL 12/05/2017   LDLCALC UNABLE TO CALCULATE IF TRIGLYCERIDE OVER 400 mg/dL 12/05/2017   LDLCALC SEE COMMENT 07/09/2012   Lab Results  Component Value Date   TSH 2.600 08/03/2018   TSH 86.000 (H) 12/05/2017    Therapeutic Level Labs: No results found for: LITHIUM No results found for: VALPROATE No components found for:  CBMZ  Current Medications: Current Outpatient Medications  Medication Sig Dispense Refill  . estradiol (ESTRING) 2 MG vaginal ring Place vaginally.    Marland Kitchen omeprazole (PRILOSEC) 40 MG capsule Take by mouth.    Marland Kitchen albuterol-ipratropium (COMBIVENT) 18-103 MCG/ACT inhaler Inhale 2 puffs into the lungs daily as needed for wheezing or shortness of breath.     Marland Kitchen amLODipine (NORVASC) 10 MG tablet Take 10 mg by mouth in the morning and at bedtime.     Marland Kitchen atorvastatin (LIPITOR) 80 MG tablet Take 1 tablet (80 mg total) by mouth daily. 90 tablet 3  . carisoprodol (SOMA) 350 MG tablet Take 1 tablet (350 mg total) by mouth 2 (two) times daily as needed for muscle spasms.    . carvedilol (COREG) 12.5 MG tablet TAKE 1 TABLET TWICE A DAY WITH MEALS 180 tablet 1  . DETROL LA 4 MG 24 hr capsule Take 4 mg by mouth daily.    . fluticasone (FLONASE) 50 MCG/ACT nasal spray Place 1 spray into both nostrils daily as needed.     . fluticasone (FLOVENT HFA) 110 MCG/ACT inhaler Inhale 1 puff into the lungs daily as needed.     . furosemide (LASIX) 20 MG tablet Take 1 tablet (20 mg total) by mouth daily. 30 tablet 0  .  HUMULIN 70/30 KWIKPEN (70-30) 100 UNIT/ML PEN Inject 10-15 Units into the skin See admin instructions. Inject 10-15 units SQ in the morning and inject 10-12 units SQ at bedtime per sliding scale    . ipratropium (ATROVENT) 0.06 % nasal  spray USE 2 SPRAY(S) IN EACH NOSTRIL THREE TIMES DAILY FOR 14 DAYS    . metFORMIN (GLUCOPHAGE-XR) 750 MG 24 hr tablet     . nitroGLYCERIN (NITROSTAT) 0.4 MG SL tablet Place 1 tablet (0.4 mg total) under the tongue every 5 (five) minutes as needed for chest pain. 25 tablet 6  . nortriptyline (PAMELOR) 25 MG capsule Take 2-3 capsules (50-75 mg total) by mouth at bedtime. Start taking 2 capsules for 2 weeks and increase to 3 capsules after that for sleep 90 capsule 1  . Oxcarbazepine (TRILEPTAL) 300 MG tablet Take 3 tablets (900 mg total) by mouth at bedtime. 270 tablet 1  . Oxycodone HCl 10 MG TABS LIMIT ONE HALF TO ONE TABLET BY MOUTH FOUR TO SIX TIMES PER DAY IF TOLERATED (NO HYDROCODONE ACETAMINOPHEN)    . potassium chloride (KLOR-CON) 10 MEQ tablet Take 1 tablet (10 mEq total) by mouth daily. 30 tablet 0  . potassium chloride (KLOR-CON) 10 MEQ tablet Take 10 mEq by mouth daily.    . RESTASIS 0.05 % ophthalmic emulsion Place 1 drop into both eyes daily as needed (for dryness).     Marland Kitchen rOPINIRole (REQUIP) 0.5 MG tablet Take 1 mg by mouth at bedtime.     . sitaGLIPtin (JANUVIA) 100 MG tablet Take 100 mg by mouth daily.    . sodium chloride 1 g tablet Take 1 tablet (1 g total) by mouth 3 (three) times daily with meals. 90 tablet 0  . SYNTHROID 25 MCG tablet Take 25 mcg by mouth daily before breakfast.     . telmisartan (MICARDIS) 80 MG tablet Take 80 mg by mouth daily.     No current facility-administered medications for this visit.     Musculoskeletal: Strength & Muscle Tone: UTA Gait & Station: normal Patient leans: N/A  Psychiatric Specialty Exam: Review of Systems  Musculoskeletal: Positive for back pain.  Psychiatric/Behavioral: Positive for sleep disturbance. Negative for agitation, behavioral problems, confusion, decreased concentration, dysphoric mood, hallucinations, self-injury and suicidal ideas. The patient is not nervous/anxious and is not hyperactive.   All other systems  reviewed and are negative.   There were no vitals taken for this visit.There is no height or weight on file to calculate BMI.  General Appearance: Casual  Eye Contact:  Fair  Speech:  Normal Rate  Volume:  Normal  Mood:  Euthymic  Affect:  Congruent  Thought Process:  Goal Directed and Descriptions of Associations: Intact  Orientation:  Full (Time, Place, and Person)  Thought Content: Logical   Suicidal Thoughts:  No  Homicidal Thoughts:  No  Memory:  Immediate;   Fair Recent;   Fair Remote;   Fair  Judgement:  Fair  Insight:  Fair  Psychomotor Activity:  Normal  Concentration:  Concentration: Fair and Attention Span: Fair  Recall:  [employer] of Knowledge: Fair  Language: Fair  Akathisia:  No  Handed:  Right  AIMS (if indicated): UTA  Assets:  Communication Skills Desire for Improvement Housing  ADL's:  Intact  Cognition: WNL  Sleep:  Poor   Screenings: AIMS     Office Visit from 12/23/2018 in [place] Office Visit from 08/18/2018 in [facility] Total Score  5  6    PHQ2-9     Office Visit from 05/09/2016 in [place] Procedure visit from 03/20/2016 in [facility] [facility] Office Visit from 03/12/2016 in [facility] Clinical Support from 02/13/2016 in [facility] Office Visit from 12/14/2015 in [facility]  PHQ-2 Total Score  0  0  0  0  0       Assessment and Plan:  Jennifer [name] is a 64 year old Caucasian female who has a history of bipolar disorder, anxiety disorder was evaluated by telemedicine today.  Patient is currently making progress with regards to her anxiety.  However continues to struggle with sleep.  Patient with psychosocial stressors of relationship struggles as well as pain.  Plan  as noted below.  Plan Bipolar disorder in remission Trileptal 900 mg p.o. daily  Generalized anxiety disorder-improving Patient was advised to start CBT-patient is not interested Continue Pamelor as prescribed Trileptal as prescribed  Insomnia-unstable Increase Pamelor to 50 mg p.o. nightly for 2 weeks and then to 75 mg p.o. nightly after that. Continue sleep hygiene techniques  Follow-up in clinic in 4 to 6 weeks or sooner if needed.  I have spent atleast 20 minutes non face to face with patient today. More than 50 % of the time was spent for preparing to see the patient ( e.g., review of test, records ),  ordering medications and test ,psychoeducation and supportive psychotherapy and care coordination,as well as documenting clinical information in electronic health record. This note was generated in part or whole with voice recognition software. Voice recognition is usually quite accurate but there are transcription errors that can and very often do occur. I apologize for any typographical errors that were not detected and corrected.       Ursula Alert, MD 03/24/2020, 11:01 AM

## 2020-05-02 ENCOUNTER — Telehealth (INDEPENDENT_AMBULATORY_CARE_PROVIDER_SITE_OTHER): Payer: Medicare Other | Admitting: Psychiatry

## 2020-05-02 ENCOUNTER — Other Ambulatory Visit: Payer: Self-pay

## 2020-05-02 ENCOUNTER — Encounter: Payer: Self-pay | Admitting: Psychiatry

## 2020-05-02 DIAGNOSIS — F3176 Bipolar disorder, in full remission, most recent episode depressed: Secondary | ICD-10-CM | POA: Diagnosis not present

## 2020-05-02 DIAGNOSIS — F411 Generalized anxiety disorder: Secondary | ICD-10-CM | POA: Diagnosis not present

## 2020-05-02 DIAGNOSIS — G4701 Insomnia due to medical condition: Secondary | ICD-10-CM | POA: Diagnosis not present

## 2020-05-02 MED ORDER — BUSPIRONE HCL 5 MG PO TABS: 5.0000 mg | ORAL_TABLET | Freq: Two times a day (BID) | ORAL | 1 refills | Status: DC

## 2020-05-02 MED ORDER — DOXEPIN HCL 10 MG PO CAPS: 10.0000 mg | ORAL_CAPSULE | Freq: Every evening | ORAL | 1 refills | Status: DC | PRN

## 2020-05-02 NOTE — Progress Notes (Signed)
Provider Location : [facility] Patient Location : Home  Virtual Visit via Video Note  I connected with Jennifer [name] on 05/02/20 at 10:20 AM EDT by a video enabled telemedicine application and verified that I am speaking with the correct person using two identifiers.   I discussed the limitations of evaluation and management by telemedicine and the availability of in person appointments. The patient expressed understanding and agreed to proceed.    I discussed the assessment and treatment plan with the patient. The patient was provided an opportunity to ask questions and all were answered. The patient agreed with the plan and demonstrated an understanding of the instructions.   The patient was advised to call back or seek an in-person evaluation if the symptoms worsen or if the condition fails to improve as anticipated.   [place] MD OP Progress Note  05/02/2020 10:22 AM Jennifer [name]  MRN:  [id number]  Chief Complaint:  Chief Complaint    Follow-up     HPI: Jennifer [name] is a 64 year old Caucasian female, lives in [place], married, has a history of bipolar disorder, GAD, insomnia, neuroleptic induced dystonia, chronic pain was evaluated by telemedicine today.  A video call was initiated however due to connection problem it had to be changed to a phone call.  Patient today reports she is currently struggling with sleep.  She reports the Pamelor as ineffective.  She also reports increased anxiety during the day.  She reports she may have tried BuSpar in the past and would like to give it a try again.  Patient reports she is tolerating her medications well.  She is compliant on all her medications as prescribed.  Patient denies any suicidality, homicidality or perceptual disturbances.  Patient denies any other concerns today.  Visit Diagnosis:    ICD-10-CM   1. Bipolar disorder, in full remission, most recent episode depressed ([place])  F31.76 busPIRone (BUSPAR) 5 MG tablet  2.  Generalized anxiety disorder  F41.1 busPIRone (BUSPAR) 5 MG tablet  3. Insomnia due to medical condition  G47.01 doxepin (SINEQUAN) 10 MG capsule    Past Psychiatric History: I have reviewed past psychiatric history from my progress note on 02/05/2018  Past Medical History:  Past Medical History:  Diagnosis Date  . Anemia 1985   bled during a cervical biopsy, led to bleed transfusion   . Anginal pain ([place])   . Anxiety   . Arthritis    pt. reports that her neck is stiff   . Bipolar depression ([place])   . Blood dyscrasia    reported hx [name] '85; saw hematologist Dr. Janese Banks 10/2016, VWD work-up WNL, not felt to have a primary bleeding disorder   . CAD (coronary artery disease)   . 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 COPD (chronic obstructive pulmonary disease) ([place])    dr. Gar [facility]onto [facility]    PCP  . CRD (chronic renal disease)   . Depression   . Depression with anxiety   . Diabetes mellitus   . Dyspnea    W/ EXERTION   . Enlarged liver   . Family history of adverse reaction to anesthesia    father woke up slowly  . Fibromyalgia   . Generalized anxiety disorder   . GERD (gastroesophageal reflux disease)   . Hyperlipidemia   . Hypertension   . Hypothyroidism   . Low back pain   . Low  sodium levels   . Migraines   . Neuropathy   . Neuropathy   . Persistent disorder of initiating or maintaining sleep   . Restless leg   . Von Willebrand disease ([place])     Past Surgical History:  Procedure Laterality Date  . ABDOMINAL HYSTERECTOMY    . 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 APPLICATION OF ROBOTIC ASSISTANCE FOR SPINAL PROCEDURE N/A 12/10/2016   Procedure: APPLICATION OF ROBOTIC ASSISTANCE FOR SPINAL PROCEDURE;  Surgeon: Kevan Ny Ditty, MD;  Location: [place];  Service: Neurosurgery;  Laterality: N/A;  . BACK SURGERY  2007   lumbar  . BREAST IMPLANT REMOVAL    . CARDIAC CATHETERIZATION     CAD,PCI of LAD, Cypher stent 2.5-28mm  . CARDIAC CATHETERIZATION     PCI of mid-LAD in stent restenosis with a drug-eluting stent  . CARDIAC CATHETERIZATION  06/03/13   [facility];MID LAD 100 % STENOSIS; PRIOR STENT; MID RCA 40 % STENOSIS  . COLONOSCOPY WITH PROPOFOL N/A 08/13/2018   Procedure: COLONOSCOPY WITH PROPOFOL;  Surgeon: [name], MD;  [facility] ENDOSCOPY;  Service: Gastroenterology;  Laterality: N/A;  . CORONARY ARTERY BYPASS GRAFT   08-12-2011   CABG x 3  . CORONARY STENT PLACEMENT    . ESOPHAGOGASTRODUODENOSCOPY (EGD) WITH PROPOFOL N/A 08/13/2018   Procedure: ESOPHAGOGASTRODUODENOSCOPY (EGD) WITH PROPOFOL;  Surgeon: [name], MD;  Location: [facility] ENDOSCOPY;  Service: Gastroenterology;  Laterality: N/A;  . LUMBAR FUSION     L1-S1  . LUMBAR PERCUTANEOUS PEDICLE SCREW 1 LEVEL N/A 12/10/2016   Procedure: Extension of pedicle screw fixation to LUMBAR FOUR;  Surgeon: Kevan Ny Ditty, MD;  Location: [place];  Service: Neurosurgery;  Laterality: N/A;  . NASAL SINUS SURGERY  1997   tuirbinate reduction   . RECTAL EXAM UNDER ANESTHESIA N/A 10/06/2018   Procedure: RECTAL EXAM UNDER ANESTHESIA;  Surgeon: Jules Husbands, MD;  Location: [facility] ORS;  Service: General;  Laterality: N/A;    Family Psychiatric History: I have reviewed family psychiatric history from my progress note on 02/05/2018 Family History:  Family History  Problem Relation Age of Onset  . Aneurysm Father        abdominal  . Diabetes Father   . Alcohol abuse Father   . Hypertension Father   . Aneurysm Brother        abdominal  . Alcohol abuse  Brother   . Hypertension Brother   . Colon cancer Brother   . Diabetes Mother   . Alcohol abuse Mother   . Hypertension Other        family hx  . Hyperlipidemia Other        family hx  . Diabetes Other        family hx    Social History: Reviewed social history from my progress note on 02/05/2018 Social History   Socioeconomic History  . Marital status: Married    Spouse name: Photographer  . Number of children: 3  . Years of education: Not on file  . Highest education level: Associate degree: occupational, Hotel manager, or vocational program  Occupational History  . Not on file  Tobacco Use  .  Smoking status: Former Smoker    Years: 16.00    Types: Cigarettes    Quit date: 06/20/2010    Years since quitting: 9.8  . Smokeless tobacco: Never Used  Vaping Use  . Vaping Use: Every day  Substance and Sexual Activity  . Alcohol use: No    Alcohol/week: 0.0 standard drinks  . Drug use: No  . Sexual activity: Yes  Other Topics Concern  . Not on file  Social History Narrative  . Not on file   Social Determinants of Health   Financial Resource Strain:   . Difficulty of Paying Living Expenses:   Food Insecurity:   . Worried About Charity fundraiser in the Last Year:   . Arboriculturist in the Last Year:   Transportation Needs:   . Film/video editor (Medical):   Marland Kitchen Lack of Transportation (Non-Medical):   Physical Activity:   . Days of Exercise per Week:   . Minutes of Exercise per Session:   Stress:   . Feeling of Stress :   Social Connections:   . Frequency of Communication with Friends and Family:   . Frequency of Social Gatherings with Friends and Family:   . Attends Religious Services:   . Active Member of Clubs or Organizations:   . Attends Archivist Meetings:   Marland Kitchen Marital Status:     Allergies: No Known Allergies  Metabolic Disorder Labs: Lab Results  Component Value Date   HGBA1C 7.2 (H) 01/01/2020   MPG 159.94 01/01/2020   MPG 223.08  12/05/2017   Lab Results  Component Value Date   PROLACTIN 6.1 12/05/2017   Lab Results  Component Value Date   CHOL 238 (H) 12/05/2017   TRIG 819 (H) 12/05/2017   HDL 41 12/05/2017   CHOLHDL 5.8 12/05/2017   VLDL UNABLE TO CALCULATE IF TRIGLYCERIDE OVER 400 mg/dL 12/05/2017   LDLCALC UNABLE TO CALCULATE IF TRIGLYCERIDE OVER 400 mg/dL 12/05/2017   LDLCALC SEE COMMENT 07/09/2012   Lab Results  Component Value Date   TSH 2.600 08/03/2018   TSH 86.000 (H) 12/05/2017    Therapeutic Level Labs: No results found for: LITHIUM No results found for: VALPROATE No components found for:  CBMZ  Current Medications: Current Outpatient Medications  Medication Sig Dispense Refill  . albuterol (VENTOLIN HFA) 108 (90 Base) MCG/ACT inhaler Inhale into the lungs.    . gabapentin (NEURONTIN) 300 MG capsule Take by mouth.    Marland Kitchen albuterol-ipratropium (COMBIVENT) 18-103 MCG/ACT inhaler Inhale 2 puffs into the lungs daily as needed for wheezing or shortness of breath.     Marland Kitchen amLODipine (NORVASC) 10 MG tablet Take 10 mg by mouth in the morning and at bedtime.     Marland Kitchen atorvastatin (LIPITOR) 80 MG tablet Take 1 tablet (80 mg total) by mouth daily. 90 tablet 3  . busPIRone (BUSPAR) 5 MG tablet Take 1 tablet (5 mg total) by mouth 2 (two) times daily. For anxiety 60 tablet 1  . carisoprodol (SOMA) 350 MG tablet Take 1 tablet (350 mg total) by mouth 2 (two) times daily as needed for muscle spasms.    . carvedilol (COREG) 12.5 MG tablet TAKE 1 TABLET TWICE A DAY WITH MEALS 180 tablet 1  . DETROL LA 4 MG 24 hr capsule Take 4 mg by mouth daily.    Marland Kitchen doxepin (SINEQUAN) 10 MG capsule Take 1-2 capsules (10-20 mg total) by mouth at bedtime as needed. FOR SLEEP 60 capsule 1  . estradiol (  ESTRING) 2 MG vaginal ring Place vaginally.    . fluticasone (FLONASE) 50 MCG/ACT nasal spray Place 1 spray into both nostrils daily as needed.     . fluticasone (FLOVENT HFA) 110 MCG/ACT inhaler Inhale 1 puff into the lungs daily as  needed.     . furosemide (LASIX) 20 MG tablet Take 1 tablet (20 mg total) by mouth daily. 30 tablet 0  . HUMULIN 70/30 KWIKPEN (70-30) 100 UNIT/ML PEN Inject 10-15 Units into the skin See admin instructions. Inject 10-15 units SQ in the morning and inject 10-12 units SQ at bedtime per sliding scale    . ipratropium (ATROVENT) 0.06 % nasal spray USE 2 SPRAY(S) IN EACH NOSTRIL THREE TIMES DAILY FOR 14 DAYS    . metFORMIN (GLUCOPHAGE-XR) 750 MG 24 hr tablet     . nitroGLYCERIN (NITROSTAT) 0.4 MG SL tablet Place 1 tablet (0.4 mg total) under the tongue every 5 (five) minutes as needed for chest pain. 25 tablet 6  . omeprazole (PRILOSEC) 40 MG capsule Take by mouth.    . Oxcarbazepine (TRILEPTAL) 300 MG tablet Take 3 tablets (900 mg total) by mouth at bedtime. 270 tablet 1  . Oxycodone HCl 10 MG TABS LIMIT ONE HALF TO ONE TABLET BY MOUTH FOUR TO SIX TIMES PER DAY IF TOLERATED (NO HYDROCODONE ACETAMINOPHEN)    . potassium chloride (KLOR-CON) 10 MEQ tablet Take 1 tablet (10 mEq total) by mouth daily. 30 tablet 0  . potassium chloride (KLOR-CON) 10 MEQ tablet Take 10 mEq by mouth daily.    . RESTASIS 0.05 % ophthalmic emulsion Place 1 drop into both eyes daily as needed (for dryness).     Marland Kitchen rOPINIRole (REQUIP) 0.5 MG tablet Take 1 mg by mouth at bedtime.     . sitaGLIPtin (JANUVIA) 100 MG tablet Take 100 mg by mouth daily.    . sodium chloride 1 g tablet Take 1 tablet (1 g total) by mouth 3 (three) times daily with meals. 90 tablet 0  . SYNTHROID 25 MCG tablet Take 25 mcg by mouth daily before breakfast.     . telmisartan (MICARDIS) 80 MG tablet Take 80 mg by mouth daily.     No current facility-administered medications for this visit.     Musculoskeletal: Strength & Muscle Tone: UTA Gait & Station: UTA Patient leans: N/A  Psychiatric Specialty Exam: Review of Systems  Psychiatric/Behavioral: Positive for sleep disturbance. The patient is nervous/anxious.   All other systems reviewed and are  negative.   There were no vitals taken for this visit.There is no height or weight on file to calculate BMI.  General Appearance: UTA  Eye Contact:  UTA  Speech:  Clear and Coherent  Volume:  Normal  Mood:  Anxious  Affect:  UTA  Thought Process:  Goal Directed and Descriptions of Associations: Intact  Orientation:  Full (Time, Place, and Person)  Thought Content: Logical   Suicidal Thoughts:  No  Homicidal Thoughts:  No  Memory:  Immediate;   Fair Recent;   Fair Remote;   Fair  Judgement:  Intact  Insight:  Fair  Psychomotor Activity:  UTA  Concentration:  Concentration: Fair and Attention Span: Fair  Recall:  [employer] of Knowledge: Fair  Language: Fair  Akathisia:  No  Handed:  Right  AIMS (if indicated): UTA  Assets:  Communication Skills Desire for Improvement Social Support  ADL's:  Intact  Cognition: WNL  Sleep:  Poor   Screenings: AIMS     Office  Visit from 12/23/2018 in [place] Visit from 08/18/2018 in [place] Total Score 5 6    PHQ2-9     Office Visit from 05/09/2016 in [place] Procedure visit from 03/20/2016 in [place] Office Visit from 03/12/2016 in [place] Clinical Support from 02/13/2016 in [place] Office Visit from 12/14/2015 in [place]  PHQ-2 Total Score 0 0 0 0 0       Assessment and Plan: KMARI BRIAN is a 64 year old Caucasian female who has a history of bipolar disorder, anxiety disorder was evaluated by telemedicine today.  Patient is currently struggling with anxiety and sleep.  Plan as noted below.  Plan Bipolar disorder in remission Trileptal 900 mg p.o. daily  GAD-improving Trileptal as prescribed Patient was advised to  restart CBT-patient declined. Start BuSpar 5 mg p.o. twice daily  Insomnia-unstable Discontinue Pamelor. Start doxepin 10 to 20 mg p.o. nightly as needed Patient to continue sleep hygiene techniques  Follow-up in clinic in 6 weeks or sooner if needed.  I have spent atleast 20 minutes non face to face with patient today. More than 50 % of the time was spent for preparing to see the patient ( e.g., review of test, records ), ordering medications and test ,psychoeducation and supportive psychotherapy and care coordination,as well as documenting clinical information in electronic health record. This note was generated in part or whole with voice recognition software. Voice recognition is usually quite accurate but there are transcription errors that can and very often do occur. I apologize for any typographical errors that were not detected and corrected.        Ursula Alert, MD 05/02/2020, 10:22 AM

## 2020-07-04 ENCOUNTER — Telehealth: Payer: Self-pay

## 2020-07-04 DIAGNOSIS — G4701 Insomnia due to medical condition: Secondary | ICD-10-CM

## 2020-07-04 DIAGNOSIS — F3176 Bipolar disorder, in full remission, most recent episode depressed: Secondary | ICD-10-CM

## 2020-07-04 DIAGNOSIS — F3175 Bipolar disorder, in partial remission, most recent episode depressed: Secondary | ICD-10-CM

## 2020-07-04 DIAGNOSIS — F411 Generalized anxiety disorder: Secondary | ICD-10-CM

## 2020-07-04 NOTE — Telephone Encounter (Signed)
receieved request for a 90 day supply from [facility]

## 2020-07-05 MED ORDER — OXCARBAZEPINE 300 MG PO TABS: 900.0000 mg | ORAL_TABLET | Freq: Every day | ORAL | 1 refills | Status: DC

## 2020-07-05 MED ORDER — BUSPIRONE HCL 5 MG PO TABS: 5.0000 mg | ORAL_TABLET | Freq: Two times a day (BID) | ORAL | 1 refills | Status: DC

## 2020-07-05 MED ORDER — DOXEPIN HCL 10 MG PO CAPS: 10.0000 mg | ORAL_CAPSULE | Freq: Every evening | ORAL | 1 refills | Status: DC | PRN

## 2020-07-05 NOTE — Telephone Encounter (Signed)
I have sent BuSpar, doxepin and Trileptal to [facility]

## 2020-07-07 ENCOUNTER — Encounter: Payer: Self-pay | Admitting: Psychiatry

## 2020-07-07 ENCOUNTER — Telehealth (INDEPENDENT_AMBULATORY_CARE_PROVIDER_SITE_OTHER): Payer: Medicare Other | Admitting: Psychiatry

## 2020-07-07 ENCOUNTER — Other Ambulatory Visit: Payer: Self-pay

## 2020-07-07 DIAGNOSIS — F3176 Bipolar disorder, in full remission, most recent episode depressed: Secondary | ICD-10-CM | POA: Diagnosis not present

## 2020-07-07 DIAGNOSIS — F411 Generalized anxiety disorder: Secondary | ICD-10-CM

## 2020-07-07 DIAGNOSIS — G4701 Insomnia due to medical condition: Secondary | ICD-10-CM | POA: Insufficient documentation

## 2020-07-07 NOTE — Progress Notes (Signed)
Provider Location : [facility] Jennifer [name] Location : Home  Participants: Jennifer [name] , Provider  Virtual Visit via Video Note  I connected with Jennifer [name] on 07/07/20 at  9:00 AM EDT by a video enabled telemedicine application and verified that I am speaking with the correct person using two identifiers.   I discussed the limitations of evaluation and management by telemedicine and the availability of in person appointments. The Jennifer [name] expressed understanding and agreed to proceed.   I discussed the assessment and treatment plan with the Jennifer [name]. The Jennifer [name] was provided an opportunity to ask questions and all were answered. The Jennifer [name] agreed with the plan and demonstrated an understanding of the instructions.   The Jennifer [name] was advised to call back or seek an in-person evaluation if the symptoms worsen or if the condition fails to improve as anticipated.   [facility] MD OP Progress Note  07/07/2020 9:44 AM [name]  MRN:  [id number]  Chief Complaint:  Chief Complaint    Follow-up     HPI: Jennifer [name] is a 63 year old Caucasian female, lives in [place], married, has a history of bipolar disorder, GAD, insomnia, neuroleptic induced dystonia, chronic pain was evaluated by telemedicine today.  Jennifer [name] today reports she is currently on her medication.  She reports she however could not sleep at all last night.  She reports she ran out of her doxepin and is still waiting for a refill.  She uses a Haematologist.  Discussed with Jennifer [name] her doxepin prescription was sent out few days ago.  Even when she was taking the doxepin her sleep was interrupted.  The only medication that works for her is the Ambien.  She however reports because of being on the oxycodone her pain management will not allow her to continue the Ambien and she is frustrated with the same.  She reports overall otherwise her mood symptoms are stable.  If she can get some sleep she will be okay.  She denies any  suicidality, homicidality or perceptual disturbances.  Jennifer [name] denies any other concerns today.  Visit Diagnosis:    ICD-10-CM   1. Bipolar disorder, in full remission, most recent episode depressed ([place])  F31.76   2. Generalized anxiety disorder  F41.1   3. Insomnia due to medical condition  G47.01     Past Psychiatric History: I have reviewed past psychiatric history from my progress note on 02/05/2018  Past Medical History:  Past Medical History:  Diagnosis Date  . Anemia 1985   bled during a cervical biopsy, led to bleed transfusion   . Anginal pain ([place])   . Anxiety   . Arthritis    pt. reports that her neck is stiff   . Bipolar depression ([place])   . Blood dyscrasia    reported hx [name] '85; saw hematologist Dr. Janese Banks 10/2016, VWD work-up WNL, not felt to have a primary bleeding disorder   . CAD (coronary artery disease)   . 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 COPD (chronic obstructive pulmonary disease) ([place])    dr. Gar Ponto [facility]    PCP  . CRD (chronic renal disease)   . Depression   . Depression with anxiety   . Diabetes mellitus   . Dyspnea    W/ EXERTION   . Enlarged liver   . Family history of adverse reaction to anesthesia    father woke up  slowly  . Fibromyalgia   . Generalized anxiety disorder   . GERD (gastroesophageal reflux disease)   . Hyperlipidemia   . Hypertension   . Hypothyroidism   . Low back pain   . Low sodium levels   . Migraines   . Neuropathy   . Neuropathy   . Persistent disorder of initiating or maintaining sleep   . Restless leg   . Von Willebrand disease ([place])     Past Surgical History:  Procedure Laterality Date  . ABDOMINAL HYSTERECTOMY    . 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 APPLICATION OF ROBOTIC ASSISTANCE FOR SPINAL PROCEDURE N/A 12/10/2016   Procedure: APPLICATION OF ROBOTIC ASSISTANCE FOR SPINAL PROCEDURE;  Surgeon: Kevan Ny Ditty, MD;  Location: [place];  Service: Neurosurgery;  Laterality: N/A;  . BACK SURGERY  2007   lumbar  . BREAST IMPLANT REMOVAL    . CARDIAC CATHETERIZATION     CAD,PCI of LAD, Cypher stent 2.5-28mm  . CARDIAC CATHETERIZATION     PCI of mid-LAD in stent restenosis with a drug-eluting stent  . CARDIAC CATHETERIZATION  06/03/13   [facility];MID LAD 100 % STENOSIS; PRIOR STENT; MID RCA 40 % STENOSIS  . COLONOSCOPY WITH PROPOFOL N/A 08/13/2018   Procedure: COLONOSCOPY WITH PROPOFOL;  Surgeon: [name], MD;  Location: [facility] ENDOSCOPY;  Service: Gastroenterology;  Laterality: N/A;  . CORONARY ARTERY BYPASS GRAFT   08-12-2011   CABG x 3  . CORONARY STENT PLACEMENT    . ESOPHAGOGASTRODUODENOSCOPY (EGD) WITH PROPOFOL N/A 08/13/2018   Procedure: ESOPHAGOGASTRODUODENOSCOPY (EGD) WITH PROPOFOL;  Surgeon: [name], MD;  Location: [facility] ENDOSCOPY;  Service: Gastroenterology;  Laterality: N/A;  . LUMBAR FUSION     L1-S1  . LUMBAR PERCUTANEOUS PEDICLE SCREW 1 LEVEL N/A 12/10/2016   Procedure: Extension of pedicle screw fixation to LUMBAR FOUR;  Surgeon: Kevan Ny Ditty, MD;  Location: [place];  Service: Neurosurgery;  Laterality: N/A;  . NASAL SINUS SURGERY  1997   tuirbinate reduction   . RECTAL EXAM UNDER ANESTHESIA N/A 10/06/2018   Procedure: RECTAL EXAM UNDER ANESTHESIA;  Surgeon: Jules Husbands, MD;  Location: [facility] ORS;  Service: General;  Laterality: N/A;    Family Psychiatric History: I have reviewed family psychiatric history from my progress note on 02/05/2018  Family History:  Family History  Problem Relation Age of Onset  .  Aneurysm Father        abdominal  . Diabetes Father   . Alcohol abuse Father   . Hypertension Father   . Aneurysm Brother        abdominal  . Alcohol abuse Brother   . Hypertension Brother   . Colon cancer Brother   . Diabetes Mother   . Alcohol abuse Mother   . Hypertension Other        family hx  . Hyperlipidemia Other        family hx  . Diabetes Other        family hx    Social History: I have reviewed social history from my progress note on 02/05/2018 Social History   Socioeconomic History  . Marital status: Married  Spouse name: Photographer  . Number of children: 3  . Years of education: Not on file  . Highest education level: Associate degree: occupational, Hotel manager, or vocational program  Occupational History  . Not on file  Tobacco Use  . Smoking status: Former Smoker    Years: 16.00    Types: Cigarettes    Quit date: 06/20/2010    Years since quitting: 10.0  . Smokeless tobacco: Never Used  Vaping Use  . Vaping Use: Every day  Substance and Sexual Activity  . Alcohol use: No    Alcohol/week: 0.0 standard drinks  . Drug use: No  . Sexual activity: Yes  Other Topics Concern  . Not on file  Social History Narrative  . Not on file   Social Determinants of Health   Financial Resource Strain:   . Difficulty of Paying Living Expenses: Not on file  Food Insecurity:   . Worried About Charity fundraiser in the Last Year: Not on file  . Ran Out of Food in the Last Year: Not on file  Transportation Needs:   . Lack of Transportation (Medical): Not on file  . Lack of Transportation (Non-Medical): Not on file  Physical Activity:   . Days of Exercise per Week: Not on file  . Minutes of Exercise per Session: Not on file  Stress:   . Feeling of Stress : Not on file  Social Connections:   . Frequency of Communication with Friends and Family: Not on file  . Frequency of Social Gatherings with Friends and Family: Not on file  . Attends Religious Services: Not on  file  . Active Member of Clubs or Organizations: Not on file  . Attends Archivist Meetings: Not on file  . Marital Status: Not on file    Allergies: No Known Allergies  Metabolic Disorder Labs: Lab Results  Component Value Date   HGBA1C 7.2 (H) 01/01/2020   MPG 159.94 01/01/2020   MPG 223.08 12/05/2017   Lab Results  Component Value Date   PROLACTIN 6.1 12/05/2017   Lab Results  Component Value Date   CHOL 238 (H) 12/05/2017   TRIG 819 (H) 12/05/2017   HDL 41 12/05/2017   CHOLHDL 5.8 12/05/2017   VLDL UNABLE TO CALCULATE IF TRIGLYCERIDE OVER 400 mg/dL 12/05/2017   LDLCALC UNABLE TO CALCULATE IF TRIGLYCERIDE OVER 400 mg/dL 12/05/2017   LDLCALC SEE COMMENT 07/09/2012   Lab Results  Component Value Date   TSH 2.600 08/03/2018   TSH 86.000 (H) 12/05/2017    Therapeutic Level Labs: No results found for: LITHIUM No results found for: VALPROATE No components found for:  CBMZ  Current Medications: Current Outpatient Medications  Medication Sig Dispense Refill  . albuterol (VENTOLIN HFA) 108 (90 Base) MCG/ACT inhaler Inhale into the lungs.    Marland Kitchen albuterol-ipratropium (COMBIVENT) 18-103 MCG/ACT inhaler Inhale 2 puffs into the lungs daily as needed for wheezing or shortness of breath.     Marland Kitchen amLODipine (NORVASC) 10 MG tablet Take 10 mg by mouth in the morning and at bedtime.     Marland Kitchen atorvastatin (LIPITOR) 80 MG tablet Take 1 tablet (80 mg total) by mouth daily. 90 tablet 3  . busPIRone (BUSPAR) 5 MG tablet Take 1 tablet (5 mg total) by mouth 2 (two) times daily. For anxiety 180 tablet 1  . carisoprodol (SOMA) 350 MG tablet Take 1 tablet (350 mg total) by mouth 2 (two) times daily as needed for muscle spasms.    . carvedilol (COREG)  12.5 MG tablet TAKE 1 TABLET TWICE A DAY WITH MEALS 180 tablet 1  . DETROL LA 4 MG 24 hr capsule Take 4 mg by mouth daily.    Marland Kitchen doxepin (SINEQUAN) 10 MG capsule Take 1-2 capsules (10-20 mg total) by mouth at bedtime as needed. FOR SLEEP 180  capsule 1  . estradiol (ESTRING) 2 MG vaginal ring Place vaginally.    . fluticasone (FLONASE) 50 MCG/ACT nasal spray Place 1 spray into both nostrils daily as needed.     . fluticasone (FLOVENT HFA) 110 MCG/ACT inhaler Inhale 1 puff into the lungs daily as needed.     . furosemide (LASIX) 20 MG tablet Take 1 tablet (20 mg total) by mouth daily. 30 tablet 0  . gabapentin (NEURONTIN) 300 MG capsule Take by mouth.    Marland Kitchen HUMULIN 70/30 KWIKPEN (70-30) 100 UNIT/ML PEN Inject 10-15 Units into the skin See admin instructions. Inject 10-15 units SQ in the morning and inject 10-12 units SQ at bedtime per sliding scale    . ipratropium (ATROVENT) 0.06 % nasal spray USE 2 SPRAY(S) IN EACH NOSTRIL THREE TIMES DAILY FOR 14 DAYS    . metFORMIN (GLUCOPHAGE-XR) 750 MG 24 hr tablet     . nitroGLYCERIN (NITROSTAT) 0.4 MG SL tablet Place 1 tablet (0.4 mg total) under the tongue every 5 (five) minutes as needed for chest pain. 25 tablet 6  . omeprazole (PRILOSEC) 40 MG capsule Take by mouth.    . Oxcarbazepine (TRILEPTAL) 300 MG tablet Take 3 tablets (900 mg total) by mouth at bedtime. 270 tablet 1  . Oxycodone HCl 10 MG TABS LIMIT ONE HALF TO ONE TABLET BY MOUTH FOUR TO SIX TIMES PER DAY IF TOLERATED (NO HYDROCODONE ACETAMINOPHEN)    . potassium chloride (KLOR-CON) 10 MEQ tablet Take 1 tablet (10 mEq total) by mouth daily. 30 tablet 0  . potassium chloride (KLOR-CON) 10 MEQ tablet Take 10 mEq by mouth daily.    . RESTASIS 0.05 % ophthalmic emulsion Place 1 drop into both eyes daily as needed (for dryness).     Marland Kitchen rOPINIRole (REQUIP) 0.5 MG tablet Take 1 mg by mouth at bedtime.     . sitaGLIPtin (JANUVIA) 100 MG tablet Take 100 mg by mouth daily.    . sodium chloride 1 g tablet Take 1 tablet (1 g total) by mouth 3 (three) times daily with meals. 90 tablet 0  . SYNTHROID 25 MCG tablet Take 25 mcg by mouth daily before breakfast.     . telmisartan (MICARDIS) 80 MG tablet Take 80 mg by mouth daily.     No current  facility-administered medications for this visit.     Musculoskeletal: Strength & Muscle Tone: UTA Gait & Station: normal Jennifer [name] leans: N/A  Psychiatric Specialty Exam: Review of Systems  Musculoskeletal: Positive for back pain.  Psychiatric/Behavioral: Positive for sleep disturbance. Negative for agitation, behavioral problems, confusion, decreased concentration, dysphoric mood, hallucinations, self-injury and suicidal ideas. The Jennifer [name] is not hyperactive.   All other systems reviewed and are negative.   There were no vitals taken for this visit.There is no height or weight on file to calculate BMI.  General Appearance: Casual  Eye Contact:  Fair  Speech:  Clear and Coherent  Volume:  Normal  Mood:  Euthymic  Affect:  Congruent  Thought Process:  Goal Directed and Descriptions of Associations: Intact  Orientation:  Full (Time, Place, and Person)  Thought Content: Logical   Suicidal Thoughts:  No  Homicidal Thoughts:  No  Memory:  Immediate;   Fair Recent;   Fair Remote;   Fair  Judgement:  Fair  Insight:  Fair  Psychomotor Activity:  Normal  Concentration:  Concentration: Fair and Attention Span: Fair  Recall:  [employer] of Knowledge: Fair  Language: Fair  Akathisia:  No  Handed:  Right  AIMS (if indicated):UTA  Assets:  Communication Skills Desire for Improvement Housing Social Support  ADL's:  Intact  Cognition: WNL  Sleep:  Poor   Screenings: AIMS     Office Visit from 12/23/2018 in [place] Office Visit from 08/18/2018 in [place] Total Score 5 6    PHQ2-9     Office Visit from 05/09/2016 in [place] Procedure visit from 03/20/2016 in [place] Office Visit from 03/12/2016 in [place] Clinical Support from 02/13/2016 in [place] Office Visit from 12/14/2015 in [place]  PHQ-2 Total Score 0 0 0 0 0       Assessment and Plan: Jennifer [name] is a 64 year old Caucasian female who has a history of bipolar disorder, anxiety disorder was evaluated by telemedicine today.  Jennifer [name] is currently struggling with sleep issues.  She has been noncompliant with doxepin.  Plan as noted below.  Plan Bipolar disorder in remission Trileptal 900 mg p.o. daily.  GAD-improving Trileptal as prescribed BuSpar 5 mg p.o. twice daily Jennifer [name] was advised to restart CBT in the past-declined  Insomnia-unstable Jennifer [name] ran out of her doxepin few days ago. Discussed with Jennifer [name] her doxepin prescription was sent to the pharmacy on request few days ago. Advised Jennifer [name] to increase doxepin to 30 mg p.o. nightly as needed and if she continues to struggle with sleep to let writer know.  Follow-up in clinic in 3 to 4 weeks or sooner if needed.  I have spent atleast 20 minutes face to face with Jennifer [name] today. More than 50 % of the time was spent for preparing to see the Jennifer [name] ( e.g., review of test, records ), ordering medications and test ,psychoeducation and supportive psychotherapy and care coordination,as well as documenting clinical information in electronic health record. This note was generated in part or whole with voice recognition software. Voice recognition is usually quite accurate but there are transcription errors that can and very often do occur. I apologize for any typographical errors that were not detected and corrected.        Ursula Alert, MD 07/07/2020, 9:44 AM

## 2020-07-14 ENCOUNTER — Other Ambulatory Visit: Payer: Self-pay

## 2020-07-14 ENCOUNTER — Other Ambulatory Visit: Payer: Self-pay | Admitting: Family Medicine

## 2020-07-14 ENCOUNTER — Ambulatory Visit
Admission: RE | Admit: 2020-07-14 | Discharge: 2020-07-14 | Disposition: A | Discharge status: Home / Self Care | Payer: Medicare Other | Source: Ambulatory Visit | Attending: Family Medicine | Admitting: Family Medicine

## 2020-07-14 DIAGNOSIS — R1032 Left lower quadrant pain: Secondary | ICD-10-CM

## 2020-07-14 DIAGNOSIS — R1012 Left upper quadrant pain: Secondary | ICD-10-CM

## 2020-07-14 LAB — POCT I-STAT CREATININE: Creatinine, Ser: 0.6 mg/dL (ref 0.44–1.00)

## 2020-07-14 MED ORDER — IOHEXOL 300 MG/ML  SOLN
100.0000 mL | Freq: Once | INTRAMUSCULAR | Status: AC | PRN
  Administered 2020-07-14: 100 mL via INTRAVENOUS

## 2020-07-16 ENCOUNTER — Encounter: Payer: Self-pay | Admitting: Emergency Medicine

## 2020-07-16 ENCOUNTER — Emergency Department
Admission: EM | Admit: 2020-07-16 | Discharge: 2020-07-17 | Disposition: A | Discharge status: Home / Self Care | Payer: Medicare Other | Attending: Emergency Medicine | Admitting: Emergency Medicine

## 2020-07-16 ENCOUNTER — Emergency Department: Payer: Medicare Other

## 2020-07-16 ENCOUNTER — Other Ambulatory Visit: Payer: Self-pay

## 2020-07-16 DIAGNOSIS — E039 Hypothyroidism, unspecified: Secondary | ICD-10-CM | POA: Insufficient documentation

## 2020-07-16 DIAGNOSIS — Z951 Presence of aortocoronary bypass graft: Secondary | ICD-10-CM | POA: Insufficient documentation

## 2020-07-16 DIAGNOSIS — Z955 Presence of coronary angioplasty implant and graft: Secondary | ICD-10-CM | POA: Diagnosis not present

## 2020-07-16 DIAGNOSIS — Z794 Long term (current) use of insulin: Secondary | ICD-10-CM | POA: Diagnosis not present

## 2020-07-16 DIAGNOSIS — I10 Essential (primary) hypertension: Secondary | ICD-10-CM

## 2020-07-16 DIAGNOSIS — Z79899 Other long term (current) drug therapy: Secondary | ICD-10-CM | POA: Insufficient documentation

## 2020-07-16 DIAGNOSIS — G894 Chronic pain syndrome: Secondary | ICD-10-CM

## 2020-07-16 DIAGNOSIS — Z87891 Personal history of nicotine dependence: Secondary | ICD-10-CM | POA: Diagnosis not present

## 2020-07-16 DIAGNOSIS — E119 Type 2 diabetes mellitus without complications: Secondary | ICD-10-CM | POA: Diagnosis not present

## 2020-07-16 DIAGNOSIS — R1012 Left upper quadrant pain: Secondary | ICD-10-CM | POA: Diagnosis present

## 2020-07-16 DIAGNOSIS — I2510 Atherosclerotic heart disease of native coronary artery without angina pectoris: Secondary | ICD-10-CM | POA: Diagnosis not present

## 2020-07-16 DIAGNOSIS — G8929 Other chronic pain: Secondary | ICD-10-CM | POA: Insufficient documentation

## 2020-07-16 LAB — CBC
HCT: 39.7 % (ref 36.0–46.0)
Hemoglobin: 13.2 g/dL (ref 12.0–15.0)
MCH: 28.6 pg (ref 26.0–34.0)
MCHC: 33.2 g/dL (ref 30.0–36.0)
MCV: 85.9 fL (ref 80.0–100.0)
Platelets: 259 10*3/uL (ref 150–400)
RBC: 4.62 MIL/uL (ref 3.87–5.11)
RDW: 13.6 % (ref 11.5–15.5)
WBC: 5.3 10*3/uL (ref 4.0–10.5)
nRBC: 0 % (ref 0.0–0.2)

## 2020-07-16 LAB — BASIC METABOLIC PANEL
Anion gap: 11 (ref 5–15)
BUN: 9 mg/dL (ref 8–23)
CO2: 26 mmol/L (ref 22–32)
Calcium: 9.1 mg/dL (ref 8.9–10.3)
Chloride: 91 mmol/L — ABNORMAL LOW (ref 98–111)
Creatinine, Ser: 0.65 mg/dL (ref 0.44–1.00)
GFR calc Af Amer: >60 mL/min (ref >60)
GFR calc non Af Amer: >60 mL/min (ref >60)
Glucose, Bld: 216 mg/dL — ABNORMAL HIGH (ref 70–99)
Potassium: 3.9 mmol/L (ref 3.5–5.1)
Sodium: 128 mmol/L — ABNORMAL LOW (ref 135–145)

## 2020-07-16 LAB — TROPONIN I (HIGH SENSITIVITY)
Troponin I (High Sensitivity): 7 ng/L (ref <18)
Troponin I (High Sensitivity): 9 ng/L (ref <18)

## 2020-07-16 MED ORDER — CARVEDILOL 6.25 MG PO TABS: 12.5000 mg | ORAL_TABLET | Freq: Two times a day (BID) | ORAL | Status: DC

## 2020-07-16 MED ORDER — SODIUM CHLORIDE 0.9 % IV BOLUS
1000.0000 mL | Freq: Once | INTRAVENOUS | Status: AC
  Administered 2020-07-16: 1000 mL via INTRAVENOUS

## 2020-07-16 MED ORDER — DROPERIDOL 2.5 MG/ML IJ SOLN
2.5000 mg | Freq: Once | INTRAMUSCULAR | Status: AC
  Administered 2020-07-16: 2.5 mg via INTRAVENOUS
  Filled 2020-07-16: qty 2

## 2020-07-16 MED ORDER — CARVEDILOL 6.25 MG PO TABS
12.5000 mg | ORAL_TABLET | Freq: Once | ORAL | Status: AC
  Administered 2020-07-16: 12.5 mg via ORAL
  Filled 2020-07-16: qty 2

## 2020-07-16 NOTE — Discharge Instructions (Addendum)
Please continue to take all of your regular medications, including your blood pressure and pain medications.  Follow-up with your pain management physician to discuss your pain regimen.  If you develop any further worsening pain, especially in combination with fevers, passing out or inability to tolerate food/drink, please return to the ED.

## 2020-07-16 NOTE — ED Notes (Signed)
Pt c/o left flak/back pain x10 days that has increased over time and abdominal distention. Pt is AOX4, NAD noted. Pt denies constipation, denies N/V. Pt reports occasional diarrhea. Abdomen is round, soft, distended.

## 2020-07-16 NOTE — ED Provider Notes (Signed)
[facility] Emergency Department Provider Note ____________________________________________   First MD Initiated Contact with Patient 07/16/20 2217     (approximate)  I have reviewed the triage vital signs and the nursing notes.  HISTORY  Chief Complaint Abdominal Pain   HPI Jennifer [name] is a 64 y.o. femalewho presents to the ED for evaluation of abdominal pain.   Chart review indicates history of HTN, HLD History of chronic pain syndrome with Norco 10 mg every day. PCP visit 2 days ago for the same LUQ abdominal pain.  Outpatient CT scan on the same day showed minimal diverticulosis without diverticulitis and a small hiatal hernia, otherwise unremarkable.    Patient reports continued LUQ abdominal pain over the past 10 days.  She reports continued pain throughout her abdomen, but primarily to her LUQ, 10/10 severity and constant for the past 10 days.  She reports this pain is nonradiating and is not affected by taking 10 mg of Norco at home.  She denies any symptoms with the pain, denying dysuria, diarrhea, vomiting, nausea, chest pain, syncope, shortness of breath or trauma. She reports tolerating p.o. intake without postprandial nausea or vomiting or pain.  She reports toileting at her baseline.  She reports the pain is usually worse at night.  She further reports that her husband has been out of town for the past 1 week.   Patient reports that she has not taken her evening blood pressure medication, carvedilol, yet today.  Past Medical History:  Diagnosis Date  . Anemia 1985   bled during a cervical biopsy, led to bleed transfusion   . Anginal pain ([place])   . Anxiety   . Arthritis    pt. reports that her neck is stiff   . Bipolar depression ([place])   . Blood dyscrasia    reported hx [name] '85; saw hematologist Dr. Janese Banks 10/2016, VWD work-up WNL, not felt to have a primary bleeding disorder   . CAD (coronary artery disease)   . 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 COPD (chronic obstructive pulmonary disease) ([place])    dr. Gar Ponto [facility]    PCP  . CRD (chronic renal disease)   . Depression   . Depression with anxiety   . Diabetes mellitus   . Dyspnea    W/ EXERTION   . Enlarged liver   . Family history of adverse reaction to anesthesia    father woke up slowly  . Fibromyalgia   . Generalized anxiety disorder   . GERD (gastroesophageal reflux disease)   . Hyperlipidemia   . Hypertension   . Hypothyroidism   . Low back pain   . Low sodium levels   . Migraines   . Neuropathy   . Neuropathy   . Persistent disorder of initiating or maintaining sleep   . Restless leg   . Von Willebrand disease [facility])     Patient Active Problem List   Diagnosis Date Noted  . Insomnia due to medical condition 07/07/2020  . Weakness   . Hypothyroidism   . Chest pain 12/31/2019  . Bipolar I disorder, most recent episode depressed ([place]) 05/05/2019  . Neuroleptic induced acute dystonia 05/05/2019  . Insomnia due to mental condition 05/05/2019  . Other specified chronic obstructive airways disease 03/04/2019  . Rectal polyp   . Hyponatremia 07/04/2018  . Nausea vomiting and diarrhea 07/04/2018  . Lumbosacral spondylosis  with radiculopathy 12/10/2016  . Complex regional pain syndrome of both lower extremities 06/03/2016  . Spinal stenosis, lumbar region, with neurogenic claudication 05/29/2015  . Lumbar post-laminectomy syndrome 04/20/2015  . Facet syndrome, lumbar 04/20/2015  . Lumbar radiculopathy 03/06/2015  . DDD (degenerative disc disease), cervical 03/05/2015  . Occipital neuralgia 03/05/2015  . Bilateral occipital neuralgia 03/05/2015  . H/O elevated lipids 03/01/2015  . H/O: HTN (hypertension) 03/01/2015  . H/O: hypothyroidism 03/01/2015  . Insomnia, persistent 03/01/2015  . H/O disease 03/01/2015  . Aggrieved 03/01/2015  . Vitamin D deficiency 03/01/2015  . Generalized  anxiety disorder 03/01/2015  . H/O diabetes mellitus 03/01/2015  . Bipolar disorder, in full remission, most recent episode depressed ([place]) 03/01/2015  . Anxiety 03/01/2015  . Fibromyalgia 03/01/2015  . Essential (primary) hypertension 09/02/2014  . Mixed hyperlipidemia 04/19/2014  . Hypertensive urgency 12/15/2013  . Dyspnea 11/24/2013  . Anxiety and depression 02/17/2013  . OP (osteoporosis) 08/27/2012  . Persons encountering health services in other specified circumstances 08/27/2012  . Coronary artery disease of native artery of native heart with stable angina pectoris ([place]) 07/15/2012  . Back pain, chronic 07/15/2012  . Type 2 diabetes mellitus with hyperlipidemia ([place]) 07/15/2012  . GERD (gastroesophageal reflux disease) 07/15/2012  . Bipolar disorder, in partial remission, most recent episode depressed ([place]) 07/15/2012  . Hypertriglyceridemia 07/15/2012  . Hernia of anterior abdominal wall 07/15/2012  . Chronic back pain 07/15/2012  . PVD (peripheral vascular disease) ([place]) 05/21/2012  . S/P CABG x 3 02/17/2012  . Dizziness 09/04/2011  . Back pain 09/04/2011  . HTN (hypertension) 01/31/2011  . SMOKER 06/14/2010  . CAD (coronary artery disease) 06/13/2010  . CHEST PAIN UNSPECIFIED 06/13/2010    Past Surgical History:  Procedure Laterality Date  . ABDOMINAL HYSTERECTOMY    . 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 APPLICATION OF ROBOTIC ASSISTANCE FOR SPINAL PROCEDURE N/A 12/10/2016   Procedure: APPLICATION OF ROBOTIC ASSISTANCE FOR SPINAL PROCEDURE;  Surgeon: Kevan Ny  Ditty, MD;  Location: [place];  Service: Neurosurgery;  Laterality: N/A;  . BACK SURGERY  2007   lumbar  . BREAST IMPLANT REMOVAL    . CARDIAC CATHETERIZATION     CAD,PCI of LAD, Cypher stent 2.5-28mm  . CARDIAC CATHETERIZATION     PCI of mid-LAD in stent restenosis with a drug-eluting stent  . CARDIAC CATHETERIZATION  06/03/13   [facility];MID LAD 100 % STENOSIS; PRIOR STENT; MID RCA 40 % STENOSIS  . COLONOSCOPY WITH PROPOFOL N/A 08/13/2018   Procedure: COLONOSCOPY WITH PROPOFOL;  Surgeon: [name], MD;  Location: [facility];  Service: Gastroenterology;  Laterality: N/A;  . CORONARY ARTERY BYPASS GRAFT   08-12-2011   CABG x 3  . CORONARY STENT PLACEMENT    . ESOPHAGOGASTRODUODENOSCOPY (EGD) WITH PROPOFOL N/A 08/13/2018   Procedure: ESOPHAGOGASTRODUODENOSCOPY (EGD) WITH PROPOFOL;  Surgeon: [name], MD;  Location: [facility] END[facility]COPY;  Service: Gastroenterology;  Laterality: N/A;  . LUMBAR FUSION     L1-S1  . LUMBAR PERCUTANEOUS PEDICLE SCREW 1 LEVEL N/A 12/10/2016   Procedure: Extension of pedicle screw fixation to LUMBAR FOUR;  Surgeon: Kevan Ny Ditty, MD;  Location: [place];  Service:  Neurosurgery;  Laterality: N/A;  . NASAL SINUS SURGERY  1997   tuirbinate reduction   . RECTAL EXAM UNDER ANESTHESIA N/A 10/06/2018   Procedure: RECTAL EXAM UNDER ANESTHESIA;  Surgeon: Jules Husbands, MD;  Location: [facility] ORS;  Service: General;  Laterality: N/A;    Prior to Admission medications   Medication Sig Start Date End Date Taking? Authorizing Provider  albuterol (VENTOLIN HFA) 108 (90 Base) MCG/ACT inhaler Inhale into the lungs. 11/03/19 11/02/20  [provider]  albuterol-ipratropium (COMBIVENT) 18-103 MCG/ACT inhaler Inhale 2 puffs into the lungs daily as needed for wheezing or shortness of breath.     [provider]  amLODipine (NORVASC) 10 MG tablet Take 10 mg by mouth in the morning and at bedtime.  12/21/19   [provider]  atorvastatin (LIPITOR) 80 MG tablet  Take 1 tablet (80 mg total) by mouth daily. 01/27/19   Minna Merritts, MD  busPIRone (BUSPAR) 5 MG tablet Take 1 tablet (5 mg total) by mouth 2 (two) times daily. For anxiety 07/05/20   Ursula Alert, MD  carisoprodol (SOMA) 350 MG tablet Take 1 tablet (350 mg total) by mouth 2 (two) times daily as needed for muscle spasms. 01/02/20   Loletha Grayer, MD  carvedilol (COREG) 12.5 MG tablet TAKE 1 TABLET TWICE A DAY WITH MEALS 03/21/20   Gollan, Kathlene November, MD  DETROL LA 4 MG 24 hr capsule Take 4 mg by mouth daily. 12/17/19   [provider]  doxepin (SINEQUAN) 10 MG capsule Take 1-2 capsules (10-20 mg total) by mouth at bedtime as needed. FOR SLEEP 07/05/20   Ursula Alert, MD  estradiol (ESTRING) 2 MG vaginal ring Place vaginally. 03/04/19   [provider]  fluticasone (FLONASE) 50 MCG/ACT nasal spray Place 1 spray into both nostrils daily as needed.  11/05/19   [provider]  fluticasone (FLOVENT HFA) 110 MCG/ACT inhaler Inhale 1 puff into the lungs daily as needed.  12/30/17   [provider]  furosemide (LASIX) 20 MG tablet Take 1 tablet (20 mg total) by mouth daily. 01/02/20   Loletha Grayer, MD  gabapentin (NEURONTIN) 300 MG capsule Take by mouth. 12/07/19   [provider]  HUMULIN 70/30 KWIKPEN (70-30) 100 UNIT/ML PEN Inject 10-15 Units into the skin See admin instructions. Inject 10-15 units SQ in the morning and inject 10-12 units SQ at bedtime per sliding scale 05/07/16   [provider]  ipratropium (ATROVENT) 0.06 % nasal spray USE 2 SPRAY(S) IN EACH NOSTRIL THREE TIMES DAILY FOR 14 DAYS 01/12/20   [provider]  metFORMIN (GLUCOPHAGE-XR) 750 MG 24 hr tablet  03/04/20   [provider]  nitroGLYCERIN (NITROSTAT) 0.4 MG SL tablet Place 1 tablet (0.4 mg total) under the tongue every 5 (five) minutes as needed for chest pain. 08/18/17   Minna Merritts, MD  omeprazole (PRILOSEC) 40 MG capsule Take by mouth. 03/06/20   [provider]  Oxcarbazepine (TRILEPTAL) 300 MG tablet Take 3 tablets (900 mg total) by mouth at bedtime. 07/05/20   Ursula Alert, MD  Oxycodone HCl 10 MG TABS LIMIT ONE HALF TO ONE TABLET BY MOUTH FOUR TO SIX TIMES PER DAY IF TOLERATED (NO HYDROCODONE ACETAMINOPHEN) 04/27/19   [provider]  potassium chloride (KLOR-CON) 10 MEQ tablet Take 1 tablet (10 mEq total) by mouth daily. 01/02/20   Loletha Grayer, MD  potassium chloride (KLOR-CON) 10 MEQ tablet Take 10 mEq by mouth daily. 01/02/20   [provider]  RESTASIS 0.05 % ophthalmic emulsion Place 1 drop into both eyes daily as needed (for dryness).  09/16/15   [provider]  rOPINIRole (REQUIP) 0.5 MG tablet Take 1 mg by mouth at bedtime.  03/06/18   [provider]  sitaGLIPtin (JANUVIA) 100 MG tablet Take 100 mg by mouth daily. 09/24/16 03/01/20  [provider]  sodium chloride 1 g tablet Take 1 tablet (1 g total) by mouth 3 (three) times daily with meals. 01/02/20   Loletha Grayer, MD  SYNTHROID 25 MCG tablet Take 25 mcg by mouth daily before breakfast.  03/08/18   [provider]  telmisartan (MICARDIS) 80 MG tablet Take 80 mg by mouth daily. 11/05/19   [provider]    Allergies Patient has no known allergies.  Family History  Problem Relation Age of Onset  . Aneurysm Father        abdominal  . Diabetes Father   . Alcohol abuse Father   . Hypertension Father   . Aneurysm Brother        abdominal  . Alcohol abuse Brother   . Hypertension Brother   . Colon cancer Brother   . Diabetes Mother   . Alcohol abuse Mother   . Hypertension Other        family hx  . Hyperlipidemia Other        family hx  . Diabetes Other        family hx    Social History Social History   Tobacco Use  . Smoking status: Former Smoker    Years: 16.00    Types: Cigarettes    Quit date: 06/20/2010    Years since quitting: 10.0  . Smokeless tobacco: Never Used  Vaping Use  . Vaping  Use: Every day  Substance Use Topics  . Alcohol use: No    Alcohol/week: 0.0 standard drinks  . Drug use: No    Review of Systems  Constitutional: No fever/chills Eyes: No visual changes. ENT: No sore throat. Cardiovascular: Denies chest pain. Respiratory: Denies shortness of breath. Gastrointestinal: Positive for abdominal pain.  No nausea, no vomiting.  No diarrhea.  No constipation. Genitourinary: Negative for dysuria. Musculoskeletal: Negative for back pain. Skin: Negative for rash. Neurological: Negative for headaches, focal weakness or numbness.  ____________________________________________   PHYSICAL EXAM:  VITAL SIGNS: Vitals:   07/16/20 2230 07/16/20 2245  BP: (!) 206/115   Pulse: 84   Resp:  18  Temp:    SpO2: 100%       Constitutional: Alert and oriented. Well appearing and in no acute distress.  Frequently rubbing her abdomen and deeply palpating herself throughout her abdomen to "to try to find the right spot."  Conversational full sentences without distress. Eyes: Conjunctivae are normal. PERRL. EOMI. Head: Atraumatic. Nose: No congestion/rhinnorhea. Mouth/Throat: Mucous membranes are moist.  Oropharynx non-erythematous. Neck: No stridor. No cervical spine tenderness to palpation. Cardiovascular: Normal rate, regular rhythm. Grossly normal heart sounds.  Good peripheral circulation. Respiratory: Normal respiratory effort.  No retractions. Lungs CTAB. Gastrointestinal: Soft , nondistended, nontender to palpation. No abdominal bruits. No CVA tenderness. Patient tolerates deep palpation throughout her abdomen, particular to the area in question. Musculoskeletal: No lower extremity tenderness nor edema.  No joint effusions. No signs of acute trauma. Neurologic:  Normal speech and language. No gross focal neurologic deficits are appreciated. No gait instability noted. Skin:  Skin is warm, dry and intact. No rash noted. Psychiatric: Mood and affect are normal.  Speech and behavior  are normal.  ____________________________________________   LABS (all labs ordered are listed, but only abnormal results are displayed)  Labs Reviewed  BASIC METABOLIC PANEL - Abnormal; Notable for the following components:      Result Value   Sodium 128 (*)    Chloride 91 (*)    Glucose, Bld 216 (*)    All other components within normal limits  CBC  TROPONIN I (HIGH SENSITIVITY)  TROPONIN I (HIGH SENSITIVITY)   ____________________________________________  12 Lead EKG Sinus rhythm, Rate of 86 bpm.  Normal axis and intervals.  No evidence of acute ischemia.  ____________________________________________  RADIOLOGY  ED MD interpretation: 2 view CXR reviewed without evidence of acute cardiopulmonary pathology.  Outpatient CT abdomen/pelvis from 9/17, 2 days ago, reviewed and without evidence of significant acute pathology.  Official radiology report(s): DG Chest 2 View  Result Date: 07/16/2020 CLINICAL DATA:  Chest pain EXAM: CHEST - 2 VIEW COMPARISON:  December 31, 2019 FINDINGS: The heart size and mediastinal contours are within normal limits. Overlying median sternotomy wires are present. Both lungs are clear. The visualized skeletal structures are unremarkable. IMPRESSION: No active cardiopulmonary disease. Electronically Signed   By: Prudencio Pair M.D.   On: 07/16/2020 15:13    ____________________________________________   PROCEDURES and INTERVENTIONS  Procedure(s) performed (including Critical Care):  Procedures  Medications  sodium chloride 0.9 % bolus 1,000 mL (has no administration in time range)  droperidol (INAPSINE) 2.5 MG/ML injection 2.5 mg (has no administration in time range)  carvedilol (COREG) tablet 12.5 mg (has no administration in time range)    ____________________________________________   MDM / ED COURSE  64 year old woman with chronic pain syndrome presents with 10 days of constant LUQ pain without evidence of acute  pathology.  Patient is hypertensive, though reports not taking her evening medications, otherwise normal vital signs on room air.  Exam is reassuring demonstrating patient without acute distress, neurovascular deficits or signs of trauma.  Her abdomen is really benign throughout and she has no peritoneal features or CVA tenderness.  She tolerates deep palpation throughout her abdomen.  Her blood work here is reassuring without leukocytosis to suggest smoldering infectious etiology, or any other evidence of acute derangements.  CT imaging from 2 days ago as an outpatient reviewed and without evidence of significant acute pathology.  Her cardiac testing here is benign without evidence of ACS or cardiac ischemia.  CXR without evidence of pathology to cause referred pain from thoracic pathology.  I suspect her pain is functional in nature and without evidence of acute underlying pathology to cause it.  We will provide droperidol, fluids and her home BP medications to ensure her BP and symptoms improved.  Discussed the case with oncoming provider, Dr. Karma Greaser, who agrees to follow-up on patient's clinical improvement after medication administration.  If her pain does not relent, I suspect a repeat CT scan would be warranted, but overall my suspicion is that she will be suitable for outpatient management.  Clinical Course as of Jul 17 2323  Nancy Fetter Jul 16, 2020  2323 Patient signed out to oncoming provider, Dr. Karma Greaser, to follow-up on clinical status after medication administration.   [DS]    Clinical Course User Index [DS] Vladimir Crofts, MD     ____________________________________________   FINAL CLINICAL IMPRESSION(S) / ED DIAGNOSES  Final diagnoses:  Chronic pain syndrome  Left upper quadrant abdominal pain  Essential hypertension     ED Discharge Orders    None  Vladimir Crofts   Note:  This document was prepared using Systems analyst and may include unintentional  dictation errors.   Vladimir Crofts, MD 07/16/20 [phone]

## 2020-07-16 NOTE — ED Triage Notes (Signed)
Pt presents via POV with c/o left sided upper abdominal pain that radiates to back. Pt states she was seen yesterday by primary care doctor and referred here for CT scan. Pt c/o pain that was not relived by pain medications at home. Pt denies N/V/D at this time. Pt appears uncomfortable in triage.

## 2020-07-17 DIAGNOSIS — R1012 Left upper quadrant pain: Secondary | ICD-10-CM | POA: Diagnosis not present

## 2020-07-17 MED ORDER — MORPHINE SULFATE (PF) 4 MG/ML IV SOLN
4.0000 mg | Freq: Once | INTRAVENOUS | Status: AC
  Administered 2020-07-17: 4 mg via INTRAVENOUS
  Filled 2020-07-17: qty 1

## 2020-07-17 MED ORDER — KETOROLAC TROMETHAMINE 30 MG/ML IJ SOLN
15.0000 mg | Freq: Once | INTRAMUSCULAR | Status: AC
  Administered 2020-07-17: 15 mg via INTRAVENOUS
  Filled 2020-07-17: qty 1

## 2020-07-17 NOTE — ED Provider Notes (Signed)
-----------------------------------------   12:50 AM on 07/17/2020 -----------------------------------------  Assumed care from Dr. Vladimir Crofts.  In short, Jennifer [name] is a 64 y.o. female with a chief complaint of left-sided abdominal pain.  Refer to the original H&P for additional details.  The patient received a dose of droperidol 2.5 mg IV and Dr. Tamala Julian spoke with the patient about her reassuring work-up, the duration of her symptoms (about 11 days) and his believe that this is most likely secondary to a flareup of her chronic pain rather than a new issue.  She had a CT scan within the last 2 to 3 days which was reassuring and showed no evidence of acute issue.  The plan was for me to reassess her after her medicine.  I just checked on her and she is clearly anxious and reports the pain is still severe.  She said it is exactly the same pain that she has had for 11 days.  She says she just wants the pain to go away.  I explained that it is unlikely we will be able to make it go away given her chronic pain syndrome and the fact that we cannot find a specific acute issue that would explain the pain.  Dr. Tamala Julian had spoken about the probability that this is a flareup of her chronic pain and that there is an anxiety component and I believe that to also be the case.  She confirmed that she has her pain medication prescribed by Dr. Primus Bravo at home.  I offered to obtain a repeat CT scan of her abdomen and pelvis as previously discussed by Dr. Tamala Julian to further assess and make sure there is no evidence of an emergent medical condition that has shown up since her last scan, but she declined and says she does not feel that is necessary given that this is the same pain she has felt for 11 days and I agree that there is likely little benefit to additional imaging.  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  I will give her a dose of morphine 4 mg IV as well as Toradol 15 mg IV and I explained to her at that point she will need to go home and follow-up with her primary care doctor and take her regular medications.  She understands and agrees with the plan.  I gave my usual and customary return precautions.   Hinda Kehr, MD 07/17/20 [phone]

## 2020-07-17 NOTE — ED Notes (Signed)
ED Provider at bedside. 

## 2020-07-21 ENCOUNTER — Other Ambulatory Visit: Payer: Self-pay | Admitting: Family Medicine

## 2020-07-21 DIAGNOSIS — R1032 Left lower quadrant pain: Secondary | ICD-10-CM

## 2020-07-21 DIAGNOSIS — R1012 Left upper quadrant pain: Secondary | ICD-10-CM

## 2020-08-07 DIAGNOSIS — B029 Zoster without complications: Secondary | ICD-10-CM | POA: Insufficient documentation

## 2020-08-12 NOTE — Progress Notes (Deleted)
Patient ID: Jennifer [name], female   DOB: 1956/09/11, 64 y.o.   MRN: [id number] Cardiology Office Note  Date:  08/12/2020   [name], DOB 09-Jun-1956, MRN [id number]  PCP:  [name]   No chief complaint on file.   HPI:  64 year old woman with a history of  Medication noncompliance coronary artery disease,  PTCA of the LAD with 2.5 x 28 mm Cypher stent in January 2007,  Repeat catheterization August 2011 with Stent placement of the mid LAD for 90% lesion, catheterization August 06, 2011 for chest pain that showed severe mid LAD in-stent restenosis at the site of the previous drug-eluting stent ( DES stent x2 placed in the LAD in 2007 and 2011), also with severe mid RCA disease, moderate to severe proximal diagonal #1 disease, ejection fraction 55%. bypass surgery at [facility] on August 12 2011 by Dr. Roxan Hockey. bypass graft x3 with a LIMA to the LAD, vein graft to the RCA, vein graft to the diagonal. She presents for routine follow-up of her coronary artery disease  In the ER 9/21, Abd pain History of chronic pain syndrome with Norco 10 mg every day. BP elevated, 970 systolic   On telemetry visit April 2020 3 to 4 weeks ago, was in [place], Bad chest pain, "hand squeezing her heart" Lots of stress, son living with them  son and daughter in law tested positive for drugs, Lost kids.   Since her last clinic visit she has continued to have episodes of chest pain Again she is concerned about ischemia/unstable angina though unable to exclude stress as a cause of her symptoms   went on vacation several weeks ago, Had some chest pain Tries not to take NTG For over one month" feels like has a blockages  Not very active Fighting with her husband over some issues, he is fixing up a trailer  On meds for chronic pain  HBA1C 10 down to 7.8  She continues to see pain clinic, psychiatry  EKG on today's visit shows normal sinus rhythm with rate 73  bpm, no significant ST or T-wave changes  Other past medical history problems with Chronic anxiety, stress at home, confusion at times. Treated by a psychiatrist in [place], started on Valium. She is tired all time, sleeping a good number of hours per day. In the past she has taken benzodiazepines, Prozac. previously on Lamictal and Cymbalta, She sees Dr. Eliberto Ivory, also sees Dr. Leilani Merl in [place]. She does travel up to [place] to help family .   cardiac catheterization 06/03/2013. 100% occluded mid LAD at the site of a stent, mild mid RCA disease, saphenous vein graft to the diagonal and LIMA graft to the LAD are patent. Occluded saphenous vein graft to the distal RCA. Medical management was recommended.  July 08 2012 right-sided chest pain. She ruled out for myocardial infarction by cardiac enzymes.  nuclear stress test which showed no evidence of ischemia with an ejection fraction of 44%. She was noted to be hypertensive with systolic blood pressure of 180.   PMH:   has a past medical history of Anemia (1985), Anginal pain ([place]), Anxiety, Arthritis, Bipolar depression ([place]), Blood dyscrasia, CAD (coronary artery disease), Cancer ([place]), COPD (chronic obstructive pulmonary disease) ([place]), CRD (chronic renal disease), Depression, Depression with anxiety, Diabetes mellitus, Dyspnea, Enlarged liver, Family history of adverse reaction to anesthesia, Fibromyalgia, Generalized anxiety disorder, GERD (gastroesophageal reflux disease), Hyperlipidemia, Hypertension, Hypothyroidism, Low back pain, Low sodium levels, Migraines, Neuropathy, Neuropathy, Persistent  disorder of initiating or maintaining sleep, Restless leg, and Von Willebrand disease ([place]).  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  and Left LUMBAR FOUR-FIVE  with Mazor;  Surgeon: Kevan Ny Ditty, MD;  Location: [facility];  Service: Neurosurgery;  Laterality: N/A;   APPLICATION OF ROBOTIC ASSISTANCE FOR SPINAL PROCEDURE N/A 12/10/2016   Procedure: APPLICATION OF ROBOTIC ASSISTANCE FOR SPINAL PROCEDURE;  Surgeon: Kevan Ny Ditty, MD;  Location: [facility];  Service: Neurosurgery;  Laterality: N/A;   BACK SURGERY  2007   lumbar   BREAST IMPLANT REMOVAL     CARDIAC CATHETERIZATION     CAD,PCI of LAD, Cypher stent 2.5-28mm   CARDIAC CATHETERIZATION     PCI of mid-LAD in stent restenosis with a drug-eluting stent   CARDIAC CATHETERIZATION  06/03/13   [facility];MID LAD 100 % STENOSIS; PRIOR STENT; MID RCA 40 % STENOSIS   COLONOSCOPY WITH PROPOFOL N/A 08/13/2018   Procedure: COLONOSCOPY WITH PROPOFOL;  Surgeon: [name], MD;  Location: [facility] ENDOSCOPY;  Service: Gastroenterology;  Laterality: N/A;   CORONARY ARTERY BYPASS GRAFT   08-12-2011   CABG x 3   CORONARY STENT PLACEMENT     ESOPHAGOGASTRODUODENOSCOPY (EGD) WITH PROPOFOL N/A 08/13/2018   Procedure: ESOPHAGOGASTRODUODENOSCOPY (EGD) WITH PROPOFOL;  Surgeon: [name], MD;  Location: [facility] ENDOSCOPY;  Service: Gastroenterology;  Laterality: N/A;   LUMBAR FUSION     L1-S1   LUMBAR PERCUTANEOUS PEDICLE SCREW 1 LEVEL N/A 12/10/2016   Procedure: Extension of pedicle screw fixation to LUMBAR FOUR;  Surgeon: Kevan Ny Ditty, MD;  Location: [facility];  Service: Neurosurgery;  Laterality: N/A;   NASAL SINUS SURGERY  1997   tuirbinate reduction    RECTAL EXAM UNDER ANESTHESIA N/A 10/06/2018   Procedure: RECTAL EXAM UNDER ANESTHESIA;  Surgeon: Jules Husbands, MD;  Location: [facility] ORS;  Service: General;  Laterality: N/A;    Current Outpatient Medications  Medication Sig Dispense Refill   albuterol (VENTOLIN HFA)  108 (90 Base) MCG/ACT inhaler Inhale into the lungs.     albuterol-ipratropium (COMBIVENT) 18-103 MCG/ACT inhaler Inhale 2 puffs into the lungs daily as needed for wheezing or shortness of breath.      amLODipine (NORVASC) 10 MG tablet Take 10 mg by mouth in the morning and at bedtime.      atorvastatin (LIPITOR) 80 MG tablet Take 1 tablet (80 mg total) by mouth daily. 90 tablet 3   busPIRone (BUSPAR) 5 MG tablet Take 1 tablet (5 mg total) by mouth 2 (two) times daily. For anxiety 180 tablet 1   carisoprodol (SOMA) 350 MG tablet Take 1 tablet (350 mg total) by mouth 2 (two) times daily as needed for muscle spasms.     carvedilol (COREG) 12.5 MG tablet TAKE 1 TABLET TWICE A DAY WITH MEALS 180 tablet 1   DETROL LA 4 MG 24 hr capsule Take 4 mg by mouth daily.     doxepin (SINEQUAN) 10 MG capsule Take 1-2 capsules (10-20 mg total) by mouth at bedtime as needed. FOR SLEEP 180 capsule 1   estradiol (ESTRING) 2 MG vaginal ring Place vaginally.     fluticasone (FLONASE) 50 MCG/ACT nasal spray Place 1 spray into both nostrils  daily as needed.      fluticasone (FLOVENT HFA) 110 MCG/ACT inhaler Inhale 1 puff into the lungs daily as needed.      furosemide (LASIX) 20 MG tablet Take 1 tablet (20 mg total) by mouth daily. 30 tablet 0   gabapentin (NEURONTIN) 300 MG capsule Take by mouth.     HUMULIN 70/30 KWIKPEN (70-30) 100 UNIT/ML PEN Inject 10-15 Units into the skin See admin instructions. Inject 10-15 units SQ in the morning and inject 10-12 units SQ at bedtime per sliding scale     ipratropium (ATROVENT) 0.06 % nasal spray USE 2 SPRAY(S) IN EACH NOSTRIL THREE TIMES DAILY FOR 14 DAYS     metFORMIN (GLUCOPHAGE-XR) 750 MG 24 hr tablet      nitroGLYCERIN (NITROSTAT) 0.4 MG SL tablet Place 1 tablet (0.4 mg total) under the tongue every 5 (five) minutes as needed for chest pain. 25 tablet 6   omeprazole (PRILOSEC) 40 MG capsule Take by mouth.     Oxcarbazepine (TRILEPTAL) 300 MG tablet Take 3  tablets (900 mg total) by mouth at bedtime. 270 tablet 1   Oxycodone HCl 10 MG TABS LIMIT ONE HALF TO ONE TABLET BY MOUTH FOUR TO SIX TIMES PER DAY IF TOLERATED (NO HYDROCODONE ACETAMINOPHEN)     potassium chloride (KLOR-CON) 10 MEQ tablet Take 1 tablet (10 mEq total) by mouth daily. 30 tablet 0   potassium chloride (KLOR-CON) 10 MEQ tablet Take 10 mEq by mouth daily.     RESTASIS 0.05 % ophthalmic emulsion Place 1 drop into both eyes daily as needed (for dryness).      rOPINIRole (REQUIP) 0.5 MG tablet Take 1 mg by mouth at bedtime.      sitaGLIPtin (JANUVIA) 100 MG tablet Take 100 mg by mouth daily.     sodium chloride 1 g tablet Take 1 tablet (1 g total) by mouth 3 (three) times daily with meals. 90 tablet 0   SYNTHROID 25 MCG tablet Take 25 mcg by mouth daily before breakfast.      telmisartan (MICARDIS) 80 MG tablet Take 80 mg by mouth daily.     No current facility-administered medications for this visit.     Allergies:   Patient has no known allergies.   Social History:  The patient  reports that she quit smoking about 10 years ago. Her smoking use included cigarettes. She quit after 16.00 years of use. She has never used smokeless tobacco. She reports that she does not drink alcohol and does not use drugs.   Family History:   family history includes Alcohol abuse in her brother, father, and mother; Aneurysm in her brother and father; Colon cancer in her brother; Diabetes in her father, mother, and another family member; Hyperlipidemia in an other family member; Hypertension in her brother, father, and another family member.    Review of Systems: Review of Systems  Constitutional: Negative.   Respiratory: Negative.   Cardiovascular: Positive for chest pain.  Gastrointestinal: Negative.   Musculoskeletal: Negative.   Neurological: Negative.   Psychiatric/Behavioral: Negative.   All other systems reviewed and are negative.    PHYSICAL EXAM: VS:  There were no vitals  taken for this visit. , BMI There is no height or weight on file to calculate BMI.  No change from previous visit GEN: Well nourished, well developed, in no acute distress  HEENT: normal  Neck: no JVD, carotid bruits, or masses Cardiac: RRR; no murmurs, rubs, or gallops,no edema  Respiratory:  clear to auscultation  bilaterally, normal work of breathing GI: soft, nontender, nondistended, + BS  MS: no deformity or atrophy  Skin: warm and dry, no rash Neuro:  Strength and sensation are intact Psych: euthymic mood, full affect  Recent Labs: 07/16/2020: BUN 9; Creatinine, Ser 0.65; Hemoglobin 13.2; Platelets 259; Potassium 3.9; Sodium 128    Lipid Panel Lab Results  Component Value Date   CHOL 238 (H) 12/05/2017   HDL 41 12/05/2017   LDLCALC UNABLE TO CALCULATE IF TRIGLYCERIDE OVER 400 mg/dL 12/05/2017   TRIG 819 (H) 12/05/2017      Wt Readings from Last 3 Encounters:  07/16/20 161 lb 2.5 oz (73.1 kg)  01/01/20 161 lb 1.6 oz (73.1 kg)  08/02/19 159 lb 12 oz (72.5 kg)       ASSESSMENT AND PLAN:  Chest pain, unspecified chest pain type -  Previous stress test end of 2017 Chronic chest pain symptoms often exacerbated by stress On her last clinic visit we offered a stress test for chest pain symptoms and she declined She is willing to proceed with ischemic work-up given continued chest pain symptoms Uncertain if she is having chest pain from stress given issues at home -Pharmacological Myoview has been ordered  Angina pectoris, unspecified ([place]) -  Stress test ordered given chest pain symptoms, some atypical as well as typical features  Hyperlipidemia Some medication noncompliance, ran out of her statin for several months Now has refill, back on the medication  SMOKER Recommended smoking cessation  Type 2 diabetes mellitus with other circulatory complication ([place]) Poorly controlled diabetes secondary to medication non compliance More compliant recently, numbers  improving Recommended strict compliance  Depression/anxiety Long history of medication noncompliance Reports having stress at home, fighting with her husband   Total encounter time more than 25 minutes  Greater than 50% was spent in counseling and coordination of care with the patient   Disposition:   F/U  6 months We will call her with the results of her stress test   No orders of the defined types were placed in this encounter.    Signed, Esmond Plants, M.D., Ph.D. 08/12/2020  [place], [place]

## 2020-08-14 ENCOUNTER — Ambulatory Visit: Payer: PRIVATE HEALTH INSURANCE | Admitting: Cardiovascular Disease

## 2020-08-14 DIAGNOSIS — E1159 Type 2 diabetes mellitus with other circulatory complications: Secondary | ICD-10-CM

## 2020-08-14 DIAGNOSIS — I10 Essential (primary) hypertension: Secondary | ICD-10-CM

## 2020-08-14 DIAGNOSIS — I739 Peripheral vascular disease, unspecified: Secondary | ICD-10-CM

## 2020-08-14 DIAGNOSIS — I25118 Atherosclerotic heart disease of native coronary artery with other forms of angina pectoris: Secondary | ICD-10-CM

## 2020-08-14 DIAGNOSIS — E782 Mixed hyperlipidemia: Secondary | ICD-10-CM

## 2020-08-14 DIAGNOSIS — F17200 Nicotine dependence, unspecified, uncomplicated: Secondary | ICD-10-CM

## 2020-08-15 ENCOUNTER — Telehealth (INDEPENDENT_AMBULATORY_CARE_PROVIDER_SITE_OTHER): Payer: Medicare Other | Admitting: Psychiatry

## 2020-08-15 ENCOUNTER — Other Ambulatory Visit: Payer: Self-pay

## 2020-08-15 ENCOUNTER — Encounter: Payer: Self-pay | Admitting: Psychiatry

## 2020-08-15 DIAGNOSIS — F411 Generalized anxiety disorder: Secondary | ICD-10-CM

## 2020-08-15 DIAGNOSIS — I25118 Atherosclerotic heart disease of native coronary artery with other forms of angina pectoris: Secondary | ICD-10-CM

## 2020-08-15 DIAGNOSIS — F3176 Bipolar disorder, in full remission, most recent episode depressed: Secondary | ICD-10-CM | POA: Diagnosis not present

## 2020-08-15 DIAGNOSIS — G4701 Insomnia due to medical condition: Secondary | ICD-10-CM | POA: Diagnosis not present

## 2020-08-15 MED ORDER — DOXEPIN HCL 50 MG PO CAPS: 50.0000 mg | ORAL_CAPSULE | Freq: Every day | ORAL | 1 refills | Status: DC

## 2020-08-15 NOTE — Progress Notes (Signed)
Virtual Visit via Video Note  I connected with Jennifer [name] on 08/15/20 at  3:00 PM EDT by a video enabled telemedicine application and verified that I am speaking with the correct person using two identifiers.  Location Provider Location : [facility] Patient Location : Home  Participants: Patient , Provider   I discussed the limitations of evaluation and management by telemedicine and the availability of in person appointments. The patient expressed understanding and agreed to proceed.    I discussed the assessment and treatment plan with the patient. The patient was provided an opportunity to ask questions and all were answered. The patient agreed with the plan and demonstrated an understanding of the instructions.   The patient was advised to call back or seek an in-person evaluation if the symptoms worsen or if the condition fails to improve as anticipated.   [place] MD OP Progress Note  08/15/2020 3:40 PM Jennifer [name]  MRN:  [id number]  Chief Complaint:  Chief Complaint    Follow-up     HPI: Jennifer [name] is a 64 year old Caucasian female, lives in [place], married, has a history of bipolar disorder, GAD, insomnia, neuroleptic induced dystonia, chronic pain was evaluated by telemedicine today.  Patient today reports she is currently struggling with sleep problems.  She reports the doxepin is not that helpful at this dosage.  She tried taking a 30 mg however that also seems to not help much with sleep.  She wonders whether she can go back on Seroquel which she took couple of years ago.  Per review of medical records Seroquel was discontinued since she was going through a depressive phase and she was started on another mood stabilizer.  Patient today denies any significant mood swings.  She denies any suicidality, homicidality or perceptual disturbances.  She continues to have anxiety symptoms, worrying about different things however reports she is able to cope and her  medications does help.  She does continue to take Trileptal, denies side effects.  Patient continues to follow-up with her pain provider and is on oxycodone for her pain.  She denies any other concerns today.  Visit Diagnosis:    ICD-10-CM   1. Bipolar disorder, in full remission, most recent episode depressed ([place])  F31.76   2. Generalized anxiety disorder  F41.1 doxepin (SINEQUAN) 50 MG capsule  3. Insomnia due to medical condition  G47.01 doxepin (SINEQUAN) 50 MG capsule    Past Psychiatric History: I have reviewed past psychiatric history from my progress note on 02/05/2018  Past Medical History:  Past Medical History:  Diagnosis Date  . Anemia 1985   bled during a cervical biopsy, led to bleed transfusion   . Anginal pain ([place])   . Anxiety   . Arthritis    pt. reports that her neck is stiff   . Bipolar depression ([place])   . Blood dyscrasia    reported hx [name] '85; saw hematologist Dr. Janese Banks 10/2016, VWD work-up WNL, not felt to have a primary bleeding disorder   . CAD (coronary artery disease)   . 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 COPD (chronic obstructive pulmonary disease) ([place])    dr. Gar [facility]    PCP  . CRD (chronic renal disease)   . Depression   . Depression with anxiety   . Diabetes mellitus   . Dyspnea    W/ EXERTION   .  Enlarged liver   . Family history of adverse reaction to anesthesia    father woke up slowly  . Fibromyalgia   . Generalized anxiety disorder   . GERD (gastroesophageal reflux disease)   . Hyperlipidemia   . Hypertension   . Hypothyroidism   . Low back pain   . Low sodium levels   . Migraines   . Neuropathy   . Neuropathy   . Persistent disorder of initiating or maintaining sleep   . Restless leg   . Von Willebrand disease ([place])     Past Surgical History:  Procedure Laterality Date  . ABDOMINAL HYSTERECTOMY    . 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 APPLICATION OF ROBOTIC ASSISTANCE FOR SPINAL PROCEDURE N/A 12/10/2016   Procedure: APPLICATION OF ROBOTIC ASSISTANCE FOR SPINAL PROCEDURE;  Surgeon: Kevan Ny Ditty, MD;  Location: [facility];  Service: Neurosurgery;  Laterality: N/A;  . BACK SURGERY  2007   lumbar  . BREAST IMPLANT REMOVAL    . CARDIAC CATHETERIZATION     CAD,PCI of LAD, Cypher stent 2.5-28mm  . CARDIAC CATHETERIZATION     PCI of mid-LAD in stent restenosis with a drug-eluting stent  . CARDIAC CATHETERIZATION  06/03/13   [facility];MID LAD 100 % STENOSIS; PRIOR STENT; MID RCA 40 % STENOSIS  . COLONOSCOPY WITH PROPOFOL N/A 08/13/2018   Procedure: COLONOSCOPY WITH PROPOFOL;  Surgeon: [name], MD;  Location: [facility] ENDOSCOPY;  Service: Gastroenterology;  Laterality: N/A;  . CORONARY ARTERY BYPASS GRAFT   08-12-2011   CABG x 3  . CORONARY STENT PLACEMENT    . ESOPHAGOGASTRODUODENOSCOPY (EGD) WITH PROPOFOL N/A 08/13/2018   Procedure: ESOPHAGOGASTRODUODENOSCOPY (EGD) WITH PROPOFOL;  Surgeon: [name], MD;  Location: [facility] ENDOSCOPY;  Service: Gastroenterology;  Laterality: N/A;  . LUMBAR FUSION     L1-S1  . LUMBAR PERCUTANEOUS PEDICLE SCREW 1 LEVEL N/A 12/10/2016   Procedure: Extension of pedicle screw fixation to LUMBAR FOUR;  Surgeon: Kevan Ny Ditty, MD;  Location: [facility];  Service: Neurosurgery;  Laterality: N/A;  . NASAL SINUS SURGERY  1997   tuirbinate reduction   . RECTAL EXAM UNDER ANESTHESIA N/A 10/06/2018   Procedure: RECTAL EXAM UNDER ANESTHESIA;  Surgeon: Jules Husbands, MD;  Location: [facility] ORS;  Service: General;  Laterality: N/A;     Family Psychiatric History: I have reviewed family psychiatric history from my progress note on 02/05/2018  Family History:  Family History  Problem Relation Age of Onset  . Aneurysm Father        abdominal  . Diabetes Father   . Alcohol abuse Father   . Hypertension Father   . Aneurysm Brother        abdominal  . Alcohol abuse Brother   . Hypertension Brother   . Colon cancer Brother   . Diabetes Mother   . Alcohol abuse Mother   . Hypertension Other        family hx  . Hyperlipidemia Other        family hx  . Diabetes Other        family hx    Social History: I have reviewed social history from  my progress note on 02/05/2018 Social History   Socioeconomic History  . Marital status: Married    Spouse name: Photographer  . Number of children: 3  . Years of education: Not on file  . Highest education level: Associate degree: occupational, Hotel manager, or vocational program  Occupational History  . Not on file  Tobacco Use  . Smoking status: Former Smoker    Years: 16.00    Types: Cigarettes    Quit date: 06/20/2010    Years since quitting: 10.1  . Smokeless tobacco: Never Used  Vaping Use  . Vaping Use: Every day  Substance and Sexual Activity  . Alcohol use: No    Alcohol/week: 0.0 standard drinks  . Drug use: No  . Sexual activity: Yes  Other Topics Concern  . Not on file  Social History Narrative  . Not on file   Social Determinants of Health   Financial Resource Strain:   . Difficulty of Paying Living Expenses: Not on file  Food Insecurity:   . Worried About Charity fundraiser in the Last Year: Not on file  . Ran Out of Food in the Last Year: Not on file  Transportation Needs:   . Lack of Transportation (Medical): Not on file  . Lack of Transportation (Non-Medical): Not on file  Physical Activity:   . Days of Exercise per Week: Not on file  . Minutes of Exercise per Session: Not on file  Stress:   . Feeling of Stress : Not on file  Social  Connections:   . Frequency of Communication with Friends and Family: Not on file  . Frequency of Social Gatherings with Friends and Family: Not on file  . Attends Religious Services: Not on file  . Active Member of Clubs or Organizations: Not on file  . Attends Archivist Meetings: Not on file  . Marital Status: Not on file    Allergies: No Known Allergies  Metabolic Disorder Labs: Lab Results  Component Value Date   HGBA1C 7.2 (H) 01/01/2020   MPG 159.94 01/01/2020   MPG 223.08 12/05/2017   Lab Results  Component Value Date   PROLACTIN 6.1 12/05/2017   Lab Results  Component Value Date   CHOL 238 (H) 12/05/2017   TRIG 819 (H) 12/05/2017   HDL 41 12/05/2017   CHOLHDL 5.8 12/05/2017   VLDL UNABLE TO CALCULATE IF TRIGLYCERIDE OVER 400 mg/dL 12/05/2017   LDLCALC UNABLE TO CALCULATE IF TRIGLYCERIDE OVER 400 mg/dL 12/05/2017   LDLCALC SEE COMMENT 07/09/2012   Lab Results  Component Value Date   TSH 2.600 08/03/2018   TSH 86.000 (H) 12/05/2017    Therapeutic Level Labs: No results found for: LITHIUM No results found for: VALPROATE No components found for:  CBMZ  Current Medications: Current Outpatient Medications  Medication Sig Dispense Refill  . chlorhexidine (PERIDEX) 0.12 % solution     . albuterol (VENTOLIN HFA) 108 (90 Base) MCG/ACT inhaler Inhale into the lungs.    Marland Kitchen albuterol-ipratropium (COMBIVENT) 18-103 MCG/ACT inhaler Inhale 2 puffs into the lungs daily as needed for wheezing or shortness of breath.     Marland Kitchen amLODipine (NORVASC) 10 MG tablet Take 10 mg by mouth in the morning and at bedtime.     Marland Kitchen amoxicillin (AMOXIL) 500 MG capsule Take 500 mg by mouth 3 (three) times daily.    Marland Kitchen atorvastatin (LIPITOR) 80 MG tablet Take 1 tablet (80 mg total) by mouth daily. 90 tablet 3  . busPIRone (BUSPAR) 5 MG tablet  Take 1 tablet (5 mg total) by mouth 2 (two) times daily. For anxiety 180 tablet 1  . carisoprodol (SOMA) 350 MG tablet Take 1 tablet (350 mg total)  by mouth 2 (two) times daily as needed for muscle spasms.    . carvedilol (COREG) 12.5 MG tablet TAKE 1 TABLET TWICE A DAY WITH MEALS 180 tablet 1  . chlorhexidine (PERIDEX) 0.12 % solution 15 mLs 2 (two) times daily.    Marland Kitchen DETROL LA 4 MG 24 hr capsule Take 4 mg by mouth daily.    Marland Kitchen doxepin (SINEQUAN) 50 MG capsule Take 1 capsule (50 mg total) by mouth at bedtime. 30 capsule 1  . estradiol (ESTRACE) 0.5 MG tablet Place vaginally.    Marland Kitchen estradiol (ESTRING) 2 MG vaginal ring Place vaginally.    . fluticasone (FLONASE) 50 MCG/ACT nasal spray Place 1 spray into both nostrils daily as needed.     . fluticasone (FLOVENT HFA) 110 MCG/ACT inhaler Inhale 1 puff into the lungs daily as needed.     . furosemide (LASIX) 20 MG tablet Take 1 tablet (20 mg total) by mouth daily. 30 tablet 0  . gabapentin (NEURONTIN) 300 MG capsule Take by mouth.    Marland Kitchen HUMULIN 70/30 KWIKPEN (70-30) 100 UNIT/ML PEN Inject 10-15 Units into the skin See admin instructions. Inject 10-15 units SQ in the morning and inject 10-12 units SQ at bedtime per sliding scale    . ipratropium (ATROVENT) 0.06 % nasal spray USE 2 SPRAY(S) IN EACH NOSTRIL THREE TIMES DAILY FOR 14 DAYS    . metFORMIN (GLUCOPHAGE-XR) 750 MG 24 hr tablet     . nitroGLYCERIN (NITROSTAT) 0.4 MG SL tablet Place 1 tablet (0.4 mg total) under the tongue every 5 (five) minutes as needed for chest pain. 25 tablet 6  . omeprazole (PRILOSEC) 40 MG capsule Take by mouth.    . Oxcarbazepine (TRILEPTAL) 300 MG tablet Take 3 tablets (900 mg total) by mouth at bedtime. 270 tablet 1  . Oxycodone HCl 10 MG TABS LIMIT ONE HALF TO ONE TABLET BY MOUTH FOUR TO SIX TIMES PER DAY IF TOLERATED (NO HYDROCODONE ACETAMINOPHEN)    . potassium chloride (KLOR-CON) 10 MEQ tablet Take 1 tablet (10 mEq total) by mouth daily. 30 tablet 0  . potassium chloride (KLOR-CON) 10 MEQ tablet Take 10 mEq by mouth daily.    . RESTASIS 0.05 % ophthalmic emulsion Place 1 drop into both eyes daily as needed (for  dryness).     Marland Kitchen rOPINIRole (REQUIP) 0.5 MG tablet Take 1 mg by mouth at bedtime.     . sitaGLIPtin (JANUVIA) 100 MG tablet Take 100 mg by mouth daily.    . sodium chloride 1 g tablet Take 1 tablet (1 g total) by mouth 3 (three) times daily with meals. 90 tablet 0  . SYNTHROID 25 MCG tablet Take 25 mcg by mouth daily before breakfast.     . telmisartan (MICARDIS) 80 MG tablet Take 80 mg by mouth daily.    . valACYclovir (VALTREX) 1000 MG tablet Take 1,000 mg by mouth 2 (two) times daily.     No current facility-administered medications for this visit.     Musculoskeletal: Strength & Muscle Tone: UTA Gait & Station: normal Patient leans: N/A  Psychiatric Specialty Exam: Review of Systems  Psychiatric/Behavioral: Positive for sleep disturbance. The patient is nervous/anxious.   All other systems reviewed and are negative.   There were no vitals taken for this visit.There is no height or weight on  file to calculate BMI.  General Appearance: Casual  Eye Contact:  Fair  Speech:  Clear and Coherent  Volume:  Normal  Mood:  Anxious coping ok  Affect:  Congruent  Thought Process:  Goal Directed and Descriptions of Associations: Intact  Orientation:  Full (Time, Place, and Person)  Thought Content: Logical   Suicidal Thoughts:  No  Homicidal Thoughts:  No  Memory:  Immediate;   Fair Recent;   Fair Remote;   Fair  Judgement:  Fair  Insight:  Fair  Psychomotor Activity:  Normal  Concentration:  Concentration: Fair and Attention Span: Fair  Recall:  [employer] of Knowledge: Fair  Language: Fair  Akathisia:  No  Handed:  Right  AIMS (if indicated): UTA  Assets:  Communication Skills Desire for Improvement Housing Social Support  ADL's:  Intact  Cognition: WNL  Sleep:  Poor   Screenings: AIMS     Office Visit from 12/23/2018 in [facility] Office Visit from 08/18/2018 in [facility] Total Score 5 6    PHQ2-9      Office Visit from 05/09/2016 in [facility] Procedure visit from 03/20/2016 in [facility] Office Visit from 03/12/2016 in [facility] Clinical Support from 02/13/2016 in [facility] Office Visit from 12/14/2015 in [facility]  PHQ-2 Total Score 0 0 0 0 0       Assessment and Plan: Jennifer [name] is a 64 year old Caucasian female who has a history of bipolar disorder, anxiety disorder was evaluated by telemedicine today.  Patient is currently struggling with sleep issues.  Discussed plan as noted below.  Plan Bipolar disorder in remission Trileptal 900 mg p.o. daily  GAD-improving Trileptal as prescribed BuSpar 5 mg p.o. twice daily Patient was referred for CBT in the past however she declined.  Insomnia-unstable Discussed with patient the drug to drug interaction between Seroquel and oxycodone.  She reports that she wants to discuss with her pain provider before starting this medication. In the meantime will increase doxepin to 50 mg p.o. nightly. She can also combine melatonin as needed with the doxepin. Continue sleep hygiene techniques.  Follow-up in clinic in 4 weeks or sooner if needed.  I have spent atleast 20 minutes face to face by video with patient today. More than 50 % of the time was spent for preparing to see the patient ( e.g., review of test, records ), ordering medications and test ,psychoeducation and supportive psychotherapy and care coordination,as well as documenting clinical information in electronic health record. This note was generated in part or whole with voice recognition software. Voice recognition is usually quite accurate but there are transcription errors that can and very often do occur. I apologize for any typographical errors that were not  detected and corrected.        Ursula Alert, MD 08/15/2020, 3:40 PM

## 2020-08-22 ENCOUNTER — Telehealth: Payer: Self-pay | Admitting: Cardiovascular Disease

## 2020-08-22 NOTE — Telephone Encounter (Signed)
Spoke with patient and reviewed that she was scheduled for tomorrow but Dr. Rockey Situ is not here. She saw him on 08/01/2020 and inquired if she was wanting to do her stress test. Reviewed she had one roughly 6 months ago and that was normal. She states that she is not having any issues with her heart at this time and then reviewed we had her down to follow up in 6 months. She states that sounds about right for her follow up but not necessary for follow up tomorrow given she is not having any active heart problems at this time. Appointment canceled and reviewed to please call us back first of the year to get her follow up scheduled. She verbalized understanding of our conversation, agreement with plan, and had no further questions at this time.

## 2020-08-23 ENCOUNTER — Ambulatory Visit: Payer: PRIVATE HEALTH INSURANCE | Admitting: Cardiovascular Disease

## 2020-09-13 ENCOUNTER — Telehealth: Payer: Medicare Other | Admitting: Psychiatry

## 2020-09-13 ENCOUNTER — Other Ambulatory Visit: Payer: Self-pay

## 2020-09-18 ENCOUNTER — Other Ambulatory Visit: Payer: Self-pay | Admitting: Cardiovascular Disease

## 2020-09-18 NOTE — Telephone Encounter (Signed)
Scheduled for 11/30

## 2020-09-18 NOTE — Telephone Encounter (Signed)
Please schedule overdue 12 month F/U appointment. Thank you! 

## 2020-09-25 NOTE — Progress Notes (Signed)
Patient ID: Jennifer [name], female   DOB: [birth year], 64 y.o.   MRN: [id number] Cardiology Office Note  Date:  09/26/2020   [name], DOB January 10, 1956, MRN [id number]  PCP:  [name]   Chief Complaint  Patient presents with  . Follow-up    12 month. Meds reviewed by the pt. verbally. "doing well."     HPI:  64 year old woman with a history of  Medication noncompliance coronary artery disease,  PTCA of the LAD with 2.5 x 28 mm Cypher stent in January 2007,  Repeat catheterization August 2011 with Stent placement of the mid LAD for 90% lesion, catheterization August 06, 2011 for chest pain that showed severe mid LAD in-stent restenosis at the site of the previous drug-eluting stent ( DES stent x2 placed in the LAD in 2007 and 2011), also with severe mid RCA disease, moderate to severe proximal diagonal #1 disease, ejection fraction 55%. bypass surgery at [facility] on August 12 2011 by Dr. Roxan Hockey. bypass graft x3 with a LIMA to the LAD, vein graft to the RCA, vein graft to the diagonal. Chronic opioid use She presents for routine follow-up of her coronary artery disease  Seen in the hospital July 16, 2020, ER visit for ABD pain Had  Shingles Took antiviral, sx improve  Stress at home,  "husband drinks, son takes too much medication"  BP, high salt intake recently Reports taking meds  She continues to see pain clinic, psychiatry  Labs reviewed Total chol 127 through PMD LDL 44  EKG personally reviewed by myself on todays visit NSR rate 93 bpm nonspecific ST abn  Chronic anxiety, stress at home, confusion at times. Treated by a psychiatrist in [place], started on Valium. She is tired all time, sleeping a good number of hours per day. In the past she has taken benzodiazepines, Prozac. previously on Lamictal and Cymbalta, She sees Dr. Eliberto Ivory, also sees Dr. Leilani Merl in [place]. She does travel up to [place] to help family .    cardiac catheterization 06/03/2013. 100% occluded mid LAD at the site of a stent, mild mid RCA disease, saphenous vein graft to the diagonal and LIMA graft to the LAD are patent. Occluded saphenous vein graft to the distal RCA. Medical management was recommended.   PMH:   has a past medical history of Anemia (1985), Anginal pain ([place]), Anxiety, Arthritis, Bipolar depression ([place]), Blood dyscrasia, CAD (coronary artery disease), Cancer ([place]), COPD (chronic obstructive pulmonary disease) ([address]), CRD (chronic renal disease), Depression, Depression with anxiety, Diabetes mellitus, Dyspnea, Enlarged liver, Family history of adverse reaction to anesthesia, Fibromyalgia, Generalized anxiety disorder, GERD (gastroesophageal reflux disease), Hyperlipidemia, Hypertension, Hypothyroidism, Low back pain, Low sodium levels, Migraines, Neuropathy, Neuropathy, Persistent disorder of initiating or maintaining sleep, Restless leg, and Von Willebrand disease ([place]).  PSH:    Past Surgical History:  Procedure Laterality Date  . ABDOMINAL HYSTERECTOMY    . 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 APPLICATION OF ROBOTIC ASSISTANCE FOR SPINAL PROCEDURE N/A 12/10/2016   Procedure: APPLICATION OF ROBOTIC ASSISTANCE FOR SPINAL PROCEDURE;  Surgeon: Kevan Ny Ditty, MD;  Location: [place];  Service: Neurosurgery;  Laterality: N/A;  . BACK SURGERY  2007   lumbar  . BREAST IMPLANT REMOVAL    . CARDIAC CATHETERIZATION     CAD,PCI of LAD, Cypher stent 2.5-28mm  . CARDIAC CATHETERIZATION     PCI of mid-LAD in stent  restenosis with a drug-eluting stent  . CARDIAC CATHETERIZATION  06/03/13   [facility];MID LAD 100 % STENOSIS; PRIOR STENT; MID RCA 40 % STENOSIS  . COLONOSCOPY WITH PROPOFOL N/A 08/13/2018   Procedure: COLONOSCOPY WITH PROPOFOL;  Surgeon: [name], MD;  Location: [facility] END[facility]COPY;  Service: Gastroenterology;  Laterality: N/A;  . CORONARY ARTERY BYPASS GRAFT   08-12-2011   CABG x 3  . CORONARY STENT PLACEMENT    . ESOPHAGOGASTRODUODENOSCOPY (EGD) WITH PROPOFOL N/A 08/13/2018   Procedure: ESOPHAGOGASTRODUODENOSCOPY (EGD) WITH PROPOFOL;  Surgeon: [name], MD;  Location: [facility] ENDOSCOPY;  Service: Gastroenterology;  Laterality: N/A;  . LUMBAR FUSION     L1-S1  . LUMBAR PERCUTANEOUS PEDICLE SCREW 1 LEVEL N/A 12/10/2016   Procedure: Extension of pedicle screw fixation to LUMBAR FOUR;  Surgeon: Kevan Ny Ditty, MD;  Location: [place];  Service: Neurosurgery;  Laterality: N/A;  . NASAL SINUS SURGERY  1997   tuirbinate reduction   . RECTAL EXAM UNDER ANESTHESIA N/A 10/06/2018   Procedure: RECTAL EXAM UNDER ANESTHESIA;  Surgeon: Jules Husbands, MD;  Location: [facility] ORS;  Service: General;  Laterality: N/A;    Current Outpatient Medications  Medication Sig Dispense Refill  . albuterol (VENTOLIN HFA) 108 (90 Base) MCG/ACT inhaler Inhale 2 puffs into the lungs every 4 (four) hours as needed.     Marland Kitchen albuterol-ipratropium (COMBIVENT) 18-103 MCG/ACT inhaler Inhale 2 puffs into the lungs daily as needed for wheezing or shortness of breath.     Marland Kitchen amLODipine (NORVASC) 10 MG tablet Take 10 mg by mouth in the morning and at bedtime.     Marland Kitchen atorvastatin (LIPITOR) 80 MG tablet Take 1 tablet (80 mg total) by mouth daily. 90 tablet 3  . busPIRone (BUSPAR) 5 MG tablet Take 1 tablet (5 mg total) by mouth 2 (two) times daily. For anxiety 180 tablet 1  . carisoprodol (SOMA) 350 MG tablet Take 1 tablet (350 mg total) by mouth 2 (two) times daily as needed for muscle spasms.    . carvedilol (COREG) 12.5 MG tablet TAKE 1  TABLET TWICE A DAY WITH MEALS 180 tablet 0  . DETROL LA 4 MG 24 hr capsule Take 4 mg by mouth daily.    Marland Kitchen doxepin (SINEQUAN) 50 MG capsule Take 1 capsule (50 mg total) by mouth at bedtime. 30 capsule 1  . estradiol (ESTRING) 2 MG vaginal ring Place vaginally.    . fluticasone (FLONASE) 50 MCG/ACT nasal spray Place 1 spray into both nostrils daily as needed.     . gabapentin (NEURONTIN) 300 MG capsule Take 300 mg by mouth at bedtime.     Marland Kitchen HUMULIN 70/30 KWIKPEN (70-30) 100 UNIT/ML PEN Inject 10-15 Units into the skin See admin instructions. Inject 10-15 units SQ in the morning and inject 10-12 units SQ at bedtime per sliding scale    . metFORMIN (GLUCOPHAGE-XR) 750 MG 24 hr tablet Take 750 mg by mouth daily with breakfast.     . nitroGLYCERIN (NITROSTAT) 0.4 MG SL tablet Place 1 tablet (0.4 mg total) under the tongue every 5 (five) minutes as needed for chest pain. 25 tablet 6  . omeprazole (PRILOSEC) 40 MG capsule Take 40 mg by mouth daily.     . Oxcarbazepine (  TRILEPTAL) 300 MG tablet Take 3 tablets (900 mg total) by mouth at bedtime. 270 tablet 1  . Oxycodone HCl 10 MG TABS LIMIT ONE HALF TO ONE TABLET BY MOUTH FOUR TO SIX TIMES PER DAY IF TOLERATED (NO HYDROCODONE ACETAMINOPHEN)    . RESTASIS 0.05 % ophthalmic emulsion Place 1 drop into both eyes daily as needed (for dryness).     Marland Kitchen rOPINIRole (REQUIP) 0.5 MG tablet Take 1 mg by mouth at bedtime.     . sitaGLIPtin (JANUVIA) 100 MG tablet Take 100 mg by mouth daily.    Marland Kitchen SYNTHROID 25 MCG tablet Take 25 mcg by mouth daily before breakfast.     . telmisartan (MICARDIS) 80 MG tablet Take 80 mg by mouth daily.     No current facility-administered medications for this visit.     Allergies:   Patient has no known allergies.   Social History:  The patient  reports that she quit smoking about 10 years ago. Her smoking use included cigarettes. She quit after 16.00 years of use. She has never used smokeless tobacco. She reports that she does not  drink alcohol and does not use drugs.   Family History:   family history includes Alcohol abuse in her brother, father, and mother; Aneurysm in her brother and father; Colon cancer in her brother; Diabetes in her father, mother, and another family member; Hyperlipidemia in an other family member; Hypertension in her brother, father, and another family member.    Review of Systems: Review of Systems  Constitutional: Negative.   Respiratory: Negative.   Cardiovascular: Positive for chest pain.  Gastrointestinal: Negative.   Musculoskeletal: Negative.   Neurological: Negative.   Psychiatric/Behavioral: Negative.   All other systems reviewed and are negative.   PHYSICAL EXAM: VS:  BP (!) 150/82 (BP Location: Left Arm, Patient Position: Sitting, Cuff Size: Normal)   Pulse 93   Ht 5\' 3"  (1.6 m)   Wt 159 lb 8 oz (72.3 kg)   SpO2 98%   BMI 28.25 kg/m  , BMI Body mass index is 28.25 kg/m.  No change from previous visit GEN: Well nourished, well developed, in no acute distress  HEENT: normal  Neck: no JVD, carotid bruits, or masses Cardiac: RRR; no murmurs, rubs, or gallops,no edema  Respiratory:  clear to auscultation bilaterally, normal work of breathing GI: soft, nontender, nondistended, + BS  MS: no deformity or atrophy  Skin: warm and dry, no rash Neuro:  Strength and sensation are intact Psych: euthymic mood, full affect  Recent Labs: 07/16/2020: BUN 9; Creatinine, Ser 0.65; Hemoglobin 13.2; Platelets 259; Potassium 3.9; Sodium 128    Lipid Panel Lab Results  Component Value Date   CHOL 238 (H) 12/05/2017   HDL 41 12/05/2017   LDLCALC UNABLE TO CALCULATE IF TRIGLYCERIDE OVER 400 mg/dL 12/05/2017   TRIG 819 (H) 12/05/2017      Wt Readings from Last 3 Encounters:  09/26/20 159 lb 8 oz (72.3 kg)  07/16/20 161 lb 2.5 oz (73.1 kg)  01/01/20 161 lb 1.6 oz (73.1 kg)     ASSESSMENT AND PLAN:  Coronary Artery disease with chronic stable  Previous stress test end of  2017 Chronic chest pain symptoms often exacerbated by stress Low risk myoview 10/20  Hyperlipidemia Some medication noncompliance, ran out of her statin for several months Now has refill, back on the medication  SMOKER Recommended smoking cessation  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    Signed, Esmond Plants, M.D., Ph.D. 09/26/2020  [place], [place]

## 2020-09-26 ENCOUNTER — Other Ambulatory Visit: Payer: Self-pay

## 2020-09-26 ENCOUNTER — Ambulatory Visit (INDEPENDENT_AMBULATORY_CARE_PROVIDER_SITE_OTHER): Payer: Medicare Other | Admitting: Cardiovascular Disease

## 2020-09-26 VITALS — BP 150/82 | HR 93 | Ht 63.0 in | Wt 159.5 lb

## 2020-09-26 DIAGNOSIS — E1159 Type 2 diabetes mellitus with other circulatory complications: Secondary | ICD-10-CM

## 2020-09-26 DIAGNOSIS — I25118 Atherosclerotic heart disease of native coronary artery with other forms of angina pectoris: Secondary | ICD-10-CM | POA: Diagnosis not present

## 2020-09-26 DIAGNOSIS — E782 Mixed hyperlipidemia: Secondary | ICD-10-CM | POA: Diagnosis not present

## 2020-09-26 DIAGNOSIS — I10 Essential (primary) hypertension: Secondary | ICD-10-CM

## 2020-09-26 DIAGNOSIS — I739 Peripheral vascular disease, unspecified: Secondary | ICD-10-CM

## 2020-09-26 DIAGNOSIS — F17200 Nicotine dependence, unspecified, uncomplicated: Secondary | ICD-10-CM

## 2020-09-26 MED ORDER — CARVEDILOL 12.5 MG PO TABS: 12.5000 mg | ORAL_TABLET | Freq: Two times a day (BID) | ORAL | 3 refills | Status: AC

## 2020-09-26 MED ORDER — AMLODIPINE BESYLATE 10 MG PO TABS
10.0000 mg | ORAL_TABLET | Freq: Every day | ORAL | 3 refills | Status: DC
Start: 2020-09-26 — End: 2024-07-06

## 2020-09-26 MED ORDER — TELMISARTAN 80 MG PO TABS: 80.0000 mg | ORAL_TABLET | Freq: Every day | ORAL | 3 refills | Status: AC

## 2020-09-26 NOTE — Patient Instructions (Addendum)
Medication Instructions:  No changes  If you need a refill on your cardiac medications before your next appointment, please call your pharmacy.    Lab work: No new labs needed   If you have labs (blood work) drawn today and your tests are completely normal, you will receive your results only by: . MyChart Message (if you have MyChart) OR . A paper copy in the mail If you have any lab test that is abnormal or we need to change your treatment, we will call you to review the results.   Testing/Procedures: No new testing needed   Follow-Up: At [facility], you and your health needs are our priority.  As part of our continuing mission to provide you with exceptional heart care, we have created designated Provider Care Teams.  These Care Teams include your primary Cardiologist (physician) and Advanced Practice Providers (APPs -  Physician Assistants and Nurse Practitioners) who all work together to provide you with the care you need, when you need it.  . You will need a follow up appointment in 6 months  . Providers on your designated Care Team:   . Christopher Berge, NP . Ryan Dunn, PA-C . Jacquelyn Visser, PA-C  Any Other Special Instructions Will Be Listed Below (If Applicable).  COVID-19 Vaccine Information can be found at: [email or web address] For questions related to vaccine distribution or appointments, please email [email or web address] or call [phone].     

## 2020-10-09 ENCOUNTER — Telehealth: Payer: Self-pay

## 2020-10-09 DIAGNOSIS — F411 Generalized anxiety disorder: Secondary | ICD-10-CM

## 2020-10-09 DIAGNOSIS — G4701 Insomnia due to medical condition: Secondary | ICD-10-CM

## 2020-10-09 MED ORDER — DOXEPIN HCL 50 MG PO CAPS: 50.0000 mg | ORAL_CAPSULE | Freq: Every day | ORAL | 1 refills | Status: DC

## 2020-10-09 NOTE — Telephone Encounter (Signed)
pt called left message that she needs a refill on her doxepin she is out.     doxepin (SINEQUAN) 50 MG capsule Medication Date: 08/15/2020 Department: [facility] Ordering/Authorizing: Ursula Alert, MD    Order Providers  Prescribing Provider Encounter Provider  Ursula Alert, MD Ursula Alert, MD   Outpatient Medication Detail   Disp Refills Start End   doxepin (SINEQUAN) 50 MG capsule 30 capsule 1 08/15/2020    Sig - Route: Take 1 capsule (50 mg total) by mouth at bedtime. - Oral   Sent to pharmacy as: doxepin (SINEQUAN) 50 MG capsule   E-Prescribing Status: Receipt confirmed by pharmacy (08/15/2020 3:08 PM EDT)    Associated Diagnoses  Generalized anxiety disorder     Insomnia due to medical condition      Pharmacy  [place], [place]

## 2020-10-09 NOTE — Telephone Encounter (Signed)
I have sent doxepin to pharmacy.

## 2020-11-03 ENCOUNTER — Encounter: Payer: Self-pay | Admitting: Psychiatry

## 2020-11-03 ENCOUNTER — Telehealth (INDEPENDENT_AMBULATORY_CARE_PROVIDER_SITE_OTHER): Payer: Medicare Other | Admitting: Psychiatry

## 2020-11-03 ENCOUNTER — Other Ambulatory Visit: Payer: Self-pay

## 2020-11-03 DIAGNOSIS — F411 Generalized anxiety disorder: Secondary | ICD-10-CM | POA: Diagnosis not present

## 2020-11-03 DIAGNOSIS — F3176 Bipolar disorder, in full remission, most recent episode depressed: Secondary | ICD-10-CM

## 2020-11-03 DIAGNOSIS — G4701 Insomnia due to medical condition: Secondary | ICD-10-CM

## 2020-11-03 NOTE — Progress Notes (Signed)
Virtual Visit via Telephone Note  I connected with Jennifer [name] on 11/03/20 at 11:20 AM EST by telephone and verified that I am speaking with the correct person using two identifiers.  Location Provider Location : [facility] Patient Location : Home  Participants: Patient , Provider   I discussed the limitations, risks, security and privacy concerns of performing an evaluation and management service by telephone and the availability of in person appointments. I also discussed with the patient that there may be a patient responsible charge related to this service. The patient expressed understanding and agreed to proceed.   I discussed the assessment and treatment plan with the patient. The patient was provided an opportunity to ask questions and all were answered. The patient agreed with the plan and demonstrated an understanding of the instructions.   The patient was advised to call back or seek an in-person evaluation if the symptoms worsen or if the condition fails to improve as anticipated.   [place] MD OP Progress Note  11/03/2020 1:27 PM Jennifer [name]  MRN:  [id number]  Chief Complaint:  Chief Complaint    Follow-up     HPI: Jennifer [name] is a 65 year old Caucasian female, lives in [place], married, has a history of bipolar disorder, GAD, insomnia, neuroleptic induced dystonia, chronic pain was evaluated by telemedicine today.  Patient today reports she had a good holiday season.  She enjoyed with her family.  She reports mood wise she is at a better place at this time.  She is compliant on her medications like Trileptal.  Denies side effects.  Patient reports sleep is good on the doxepin.  It takes her a couple of hours to fall asleep however once she falls asleep she is able to sleep through the night.  She reports after she takes the medication at around 8:30 PM she watches TV for a couple of hours.  She is often able to fall asleep by 10:30  or 11 PM  She is able to sleep  till 8 - 8:30 in the morning.  Patient denies any significant anxiety attacks or panic attacks.  She reports her pain is more under control.  She denies any suicidality, homicidality or perceptual disturbances.  Patient denies any other concerns today.    Visit Diagnosis:    ICD-10-CM   1. Bipolar disorder, in full remission, most recent episode depressed ([place])  F31.76   2. Generalized anxiety disorder  F41.1   3. Insomnia due to medical condition  G47.01    pain, anxiety, mood disorder    Past Psychiatric History: I have reviewed past psychiatric history from my progress note on 02/05/2018.  Past Medical History:  Past Medical History:  Diagnosis Date  . Anemia 1985   bled during a cervical biopsy, led to bleed transfusion   . Anginal pain ([place])   . Anxiety   . Arthritis    pt. reports that her neck is stiff   . Bipolar depression ([place])   . Blood dyscrasia    reported hx [name] '85; saw hematologist Dr. Janese Banks 10/2016, VWD work-up WNL, not felt to have a primary bleeding disorder   . CAD (coronary artery disease)   . 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 COPD (chronic obstructive pulmonary disease) ([place])    dr. Gar [facility]    PCP  . CRD (chronic renal disease)   . Depression   .  Depression with anxiety   . Diabetes mellitus   . Dyspnea    W/ EXERTION   . Enlarged liver   . Family history of adverse reaction to anesthesia    father woke up slowly  . Fibromyalgia   . Generalized anxiety disorder   . GERD (gastroesophageal reflux disease)   . Hyperlipidemia   . Hypertension   . Hypothyroidism   . Low back pain   . Low sodium levels   . Migraines   . Neuropathy   . Neuropathy   . Persistent disorder of initiating or maintaining sleep   . Restless leg   . Von Willebrand disease ([place])     Past Surgical History:  Procedure Laterality Date  . ABDOMINAL HYSTERECTOMY    . 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 APPLICATION OF ROBOTIC ASSISTANCE FOR SPINAL PROCEDURE N/A 12/10/2016   Procedure: APPLICATION OF ROBOTIC ASSISTANCE FOR SPINAL PROCEDURE;  Surgeon: Kevan Ny Ditty, MD;  Location: [place];  Service: Neurosurgery;  Laterality: N/A;  . BACK SURGERY  2007   lumbar  . BREAST IMPLANT REMOVAL    . CARDIAC CATHETERIZATION     CAD,PCI of LAD, Cypher stent 2.5-28mm  . CARDIAC CATHETERIZATION     PCI of mid-LAD in stent restenosis with a drug-eluting stent  . CARDIAC CATHETERIZATION  06/03/13   [facility];MID LAD 100 % STENOSIS; PRIOR STENT; MID RCA 40 % STENOSIS  . COLONOSCOPY WITH PROPOFOL N/A 08/13/2018   Procedure: COLONOSCOPY WITH PROPOFOL;  Surgeon: [name], MD;  Location: [facility] ENDOSCOPY;  Service: Gastroenterology;  Laterality: N/A;  . CORONARY ARTERY BYPASS GRAFT   08-12-2011   CABG x 3  . CORONARY STENT PLACEMENT    . ESOPHAGOGASTRODUODENOSCOPY (EGD) WITH PROPOFOL N/A 08/13/2018   Procedure: ESOPHAGOGASTRODUODENOSCOPY (EGD) WITH PROPOFOL;  Surgeon: [name], MD;  Location: [facility] ENDOSCOPY;  Service: Gastroenterology;  Laterality: N/A;  . LUMBAR FUSION     L1-S1  . LUMBAR PERCUTANEOUS PEDICLE SCREW 1 LEVEL N/A 12/10/2016   Procedure: Extension of pedicle screw fixation to LUMBAR FOUR;  Surgeon: Kevan Ny Ditty, MD;  Location: [place];  Service: Neurosurgery;  Laterality: N/A;  . NASAL SINUS SURGERY  1997   tuirbinate reduction   . RECTAL EXAM UNDER ANESTHESIA N/A 10/06/2018   Procedure: RECTAL EXAM UNDER ANESTHESIA;  Surgeon: Jules Husbands, MD;  Location: [facility] ORS;  Service:  General;  Laterality: N/A;    Family Psychiatric History: I have reviewed family psychiatric history from my progress note on 02/05/2018.  Family History:  Family History  Problem Relation Age of Onset  . Aneurysm Father        abdominal  . Diabetes Father   . Alcohol abuse Father   . Hypertension Father   . Aneurysm Brother        abdominal  . Alcohol abuse Brother   . Hypertension Brother   . Colon cancer Brother   . Diabetes Mother   . Alcohol abuse Mother   . Hypertension Other        family hx  . Hyperlipidemia Other        family hx  . Diabetes Other  family hx    Social History: Reviewed social history from my progress note on 02/05/2018. Social History   Socioeconomic History  . Marital status: Married    Spouse name: Photographer  . Number of children: 3  . Years of education: Not on file  . Highest education level: Associate degree: occupational, Hotel manager, or vocational program  Occupational History  . Not on file  Tobacco Use  . Smoking status: Former Smoker    Years: 16.00    Types: Cigarettes    Quit date: 06/20/2010    Years since quitting: 10.3  . Smokeless tobacco: Never Used  Vaping Use  . Vaping Use: Every day  Substance and Sexual Activity  . Alcohol use: No    Alcohol/week: 0.0 standard drinks  . Drug use: No  . Sexual activity: Yes  Other Topics Concern  . Not on file  Social History Narrative  . Not on file   Social Determinants of Health   Financial Resource Strain: Not on file  Food Insecurity: Not on file  Transportation Needs: Not on file  Physical Activity: Not on file  Stress: Not on file  Social Connections: Not on file    Allergies: No Known Allergies  Metabolic Disorder Labs: Lab Results  Component Value Date   HGBA1C 7.2 (H) 01/01/2020   MPG 159.94 01/01/2020   MPG 223.08 12/05/2017   Lab Results  Component Value Date   PROLACTIN 6.1 12/05/2017   Lab Results  Component Value Date   CHOL 238 (H) 12/05/2017    TRIG 819 (H) 12/05/2017   HDL 41 12/05/2017   CHOLHDL 5.8 12/05/2017   VLDL UNABLE TO CALCULATE IF TRIGLYCERIDE OVER 400 mg/dL 12/05/2017   LDLCALC UNABLE TO CALCULATE IF TRIGLYCERIDE OVER 400 mg/dL 12/05/2017   LDLCALC SEE COMMENT 07/09/2012   Lab Results  Component Value Date   TSH 2.600 08/03/2018   TSH 86.000 (H) 12/05/2017    Therapeutic Level Labs: No results found for: LITHIUM No results found for: VALPROATE No components found for:  CBMZ  Current Medications: Current Outpatient Medications  Medication Sig Dispense Refill  . albuterol-ipratropium (COMBIVENT) 18-103 MCG/ACT inhaler Inhale 2 puffs into the lungs daily as needed for wheezing or shortness of breath.     Marland Kitchen amLODipine (NORVASC) 10 MG tablet Take 1 tablet (10 mg total) by mouth daily. 90 tablet 3  . atorvastatin (LIPITOR) 80 MG tablet Take 1 tablet (80 mg total) by mouth daily. 90 tablet 3  . busPIRone (BUSPAR) 5 MG tablet Take 1 tablet (5 mg total) by mouth 2 (two) times daily. For anxiety 180 tablet 1  . carisoprodol (SOMA) 350 MG tablet Take 1 tablet (350 mg total) by mouth 2 (two) times daily as needed for muscle spasms.    . carvedilol (COREG) 12.5 MG tablet Take 1 tablet (12.5 mg total) by mouth 2 (two) times daily with a meal. 180 tablet 3  . DETROL LA 4 MG 24 hr capsule Take 4 mg by mouth daily.    Marland Kitchen doxepin (SINEQUAN) 50 MG capsule Take 1 capsule (50 mg total) by mouth at bedtime. 90 capsule 1  . estradiol (ESTRING) 2 MG vaginal ring Place vaginally.    . fluticasone (FLONASE) 50 MCG/ACT nasal spray Place 1 spray into both nostrils daily as needed.     . gabapentin (NEURONTIN) 300 MG capsule Take 300 mg by mouth at bedtime.     Marland Kitchen HUMULIN 70/30 KWIKPEN (70-30) 100 UNIT/ML PEN Inject 10-15 Units into the skin  See admin instructions. Inject 10-15 units SQ in the morning and inject 10-12 units SQ at bedtime per sliding scale    . metFORMIN (GLUCOPHAGE-XR) 750 MG 24 hr tablet Take 750 mg by mouth daily with  breakfast.     . nitroGLYCERIN (NITROSTAT) 0.4 MG SL tablet Place 1 tablet (0.4 mg total) under the tongue every 5 (five) minutes as needed for chest pain. 25 tablet 6  . omeprazole (PRILOSEC) 40 MG capsule Take 40 mg by mouth daily.     . Oxcarbazepine (TRILEPTAL) 300 MG tablet Take 3 tablets (900 mg total) by mouth at bedtime. 270 tablet 1  . Oxycodone HCl 10 MG TABS LIMIT ONE HALF TO ONE TABLET BY MOUTH FOUR TO SIX TIMES PER DAY IF TOLERATED (NO HYDROCODONE ACETAMINOPHEN)    . RESTASIS 0.05 % ophthalmic emulsion Place 1 drop into both eyes daily as needed (for dryness).     Marland Kitchen rOPINIRole (REQUIP) 0.5 MG tablet Take 1 mg by mouth at bedtime.     . sitaGLIPtin (JANUVIA) 100 MG tablet Take 100 mg by mouth daily.    Marland Kitchen SYNTHROID 25 MCG tablet Take 25 mcg by mouth daily before breakfast.     . telmisartan (MICARDIS) 80 MG tablet Take 1 tablet (80 mg total) by mouth daily. 90 tablet 3   No current facility-administered medications for this visit.     Musculoskeletal: Strength & Muscle Tone: UTA Gait & Station: UTA Patient leans: N/A  Psychiatric Specialty Exam: Review of Systems  Psychiatric/Behavioral: Positive for sleep disturbance (Improving).  All other systems reviewed and are negative.   There were no vitals taken for this visit.There is no height or weight on file to calculate BMI.  General Appearance: UTA  Eye Contact:  UTA  Speech:  Clear and Coherent  Volume:  Normal  Mood:  Euthymic  Affect:  UTA  Thought Process:  Goal Directed and Descriptions of Associations: Intact  Orientation:  Full (Time, Place, and Person)  Thought Content: Logical   Suicidal Thoughts:  No  Homicidal Thoughts:  No  Memory:  Immediate;   Fair Recent;   Fair Remote;   Fair  Judgement:  Fair  Insight:  Fair  Psychomotor Activity:  UTA  Concentration:  Concentration: Fair and Attention Span: Fair  Recall:  [employer] of Knowledge: Fair  Language: Fair  Akathisia:  No  Handed:  Right  AIMS  (if indicated): UTA  Assets:  Communication Skills Desire for Improvement Housing Social Support  ADL's:  Intact  Cognition: WNL  Sleep:  Improving   Screenings: [place] Office Visit from 12/23/2018 in [place] Office Visit from 08/18/2018 in [place] Total Score 5 6    PHQ2-9   [place] Office Visit from 05/09/2016 in [place] Procedure visit from 03/20/2016 in [place] Office Visit from 03/12/2016 in [place] Clinical Support from 02/13/2016 in [place] Office Visit from 12/14/2015 in [place]  PHQ-2 Total Score 0 0 0 0 0       Assessment and Plan: Jennifer [name] is a 65 year old Caucasian female who has a history of bipolar disorder, anxiety disorder was evaluated by telemedicine today.  Patient is currently stable on current medication regimen.  Plan as noted below.  Plan Bipolar disorder in remission Trileptal  900 mg p.o. daily  GAD-stable Trileptal as prescribed BuSpar 5 mg p.o. twice daily Patient was referred for CBT in the past however she declined.  Insomnia-improving Continue doxepin 50 mg p.o. nightly Continue sleep hygiene techniques.  Follow-up in clinic in 2 to 3 months or sooner if needed.  I have spent atleast 20 minutes non face to face  with patient today. More than 50 % of the time was spent for preparing to see the patient ( e.g., review of test, records ),  ordering medications and test ,psychoeducation and supportive psychotherapy and care coordination,as well as documenting clinical information in electronic health record. This note was generated in part or whole with voice recognition software. Voice recognition is usually quite accurate  but there are transcription errors that can and very often do occur. I apologize for any typographical errors that were not detected and corrected.        Ursula Alert, MD 11/03/2020, 1:27 PM

## 2020-12-18 DIAGNOSIS — M5416 Radiculopathy, lumbar region: Secondary | ICD-10-CM | POA: Insufficient documentation

## 2020-12-26 ENCOUNTER — Other Ambulatory Visit: Payer: Self-pay | Admitting: Family Medicine

## 2020-12-26 DIAGNOSIS — M5416 Radiculopathy, lumbar region: Secondary | ICD-10-CM

## 2021-01-11 ENCOUNTER — Other Ambulatory Visit: Payer: Self-pay

## 2021-01-11 ENCOUNTER — Ambulatory Visit
Admission: RE | Admit: 2021-01-11 | Discharge: 2021-01-11 | Disposition: A | Discharge status: Home / Self Care | Payer: Medicare Other | Source: Ambulatory Visit | Attending: Family Medicine | Admitting: Family Medicine

## 2021-01-11 DIAGNOSIS — M5416 Radiculopathy, lumbar region: Secondary | ICD-10-CM

## 2021-01-11 MED ORDER — GADOBUTROL 1 MMOL/ML IV SOLN
7.0000 mL | Freq: Once | INTRAVENOUS | Status: AC | PRN
  Administered 2021-01-11: 7 mL via INTRAVENOUS

## 2021-01-19 ENCOUNTER — Telehealth (INDEPENDENT_AMBULATORY_CARE_PROVIDER_SITE_OTHER): Payer: Medicare Other | Admitting: Psychiatry

## 2021-01-19 ENCOUNTER — Encounter: Payer: Self-pay | Admitting: Psychiatry

## 2021-01-19 ENCOUNTER — Other Ambulatory Visit: Payer: Self-pay

## 2021-01-19 DIAGNOSIS — F5105 Insomnia due to other mental disorder: Secondary | ICD-10-CM | POA: Diagnosis not present

## 2021-01-19 DIAGNOSIS — F411 Generalized anxiety disorder: Secondary | ICD-10-CM

## 2021-01-19 DIAGNOSIS — F3176 Bipolar disorder, in full remission, most recent episode depressed: Secondary | ICD-10-CM | POA: Diagnosis not present

## 2021-01-19 DIAGNOSIS — Z79899 Other long term (current) drug therapy: Secondary | ICD-10-CM

## 2021-01-19 NOTE — Progress Notes (Signed)
Virtual Visit via Video Note  I connected with Jennifer [name] on 01/19/21 at 11:00 AM EDT by a video enabled telemedicine application and verified that I am speaking with the correct person using two identifiers.  Location Provider Location : [facility] Patient Location : Home  Participants: Patient , Provider   I discussed the limitations of evaluation and management by telemedicine and the availability of in person appointments. The patient expressed understanding and agreed to proceed.  I discussed the assessment and treatment plan with the patient. The patient was provided an opportunity to ask questions and all were answered. The patient agreed with the plan and demonstrated an understanding of the instructions.   The patient was advised to call back or seek an in-person evaluation if the symptoms worsen or if the condition fails to improve as anticipated.   [place] MD OP Progress Note  01/19/2021 11:36 AM EMAYA PRESTON  MRN:  [id number]  Chief Complaint:  Chief Complaint    Follow-up; Anxiety; Insomnia     HPI: Jennifer [name] is a 65 year old Caucasian female, lives in [place], married, has a history of bipolar disorder, GAD, insomnia, neuroleptic induced dystonia, chronic pain was evaluated by telemedicine today.  Patient today reports she is currently overall doing okay.  She reports sleep is better on the doxepin.  There are nights that she takes melatonin and the combination helps.  She does continue to struggle with back pain and is currently under the care of pain management.  Patient reports overall her mood symptoms are stable.  She is compliant on medications.  Denies side effects.  Patient denies any suicidality, homicidality or perceptual disturbances.  She does report psychosocial stressors of her husband's alcoholism.  She however reports she is trying to make him aware of this.  Patient denies any other concerns today.  Visit Diagnosis:    ICD-10-CM   1.  Bipolar disorder, in full remission, most recent episode depressed ([place])  F31.76   2. Generalized anxiety disorder  F41.1   3. Insomnia due to mental condition  F51.05    pain, anxiety  4. High risk medication use  Z79.899 CMP and Liver    TSH    Past Psychiatric History: I have reviewed past psychiatric history from my progress note on 02/05/2018  Past Medical History:  Past Medical History:  Diagnosis Date  . Anemia 1985   bled during a cervical biopsy, led to bleed transfusion   . Anginal pain ([place])   . Anxiety   . Arthritis    pt. reports that her neck is stiff   . Bipolar depression ([place])   . Blood dyscrasia    reported hx [name] '85; saw hematologist Dr. Janese Banks 10/2016, VWD work-up WNL, not felt to have a primary bleeding disorder   . CAD (coronary artery disease)   . 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 COPD (chronic obstructive pulmonary disease) ([place])    dr. Gar Ponto [facility] IN [facility]    PCP  . CRD (chronic renal disease)   . Depression   . Depression with anxiety   . Diabetes mellitus   . Dyspnea    W/ EXERTION   . Enlarged liver   . Family history of adverse reaction to anesthesia    father woke up slowly  . Fibromyalgia   . Generalized anxiety disorder   . GERD (gastroesophageal reflux disease)   .  Hyperlipidemia   . Hypertension   . Hypothyroidism   . Low back pain   . Low sodium levels   . Migraines   . Neuropathy   . Neuropathy   . Persistent disorder of initiating or maintaining sleep   . Restless leg   . Von Willebrand disease ([place])     Past Surgical History:  Procedure Laterality Date  . ABDOMINAL HYSTERECTOMY    . ANTERIOR LAT LUMBAR FUSION N/A 12/10/2016   Procedure: LUMBAR FOUR-FIVE  Anterior lateral lumbar interbody fusion with LUMBAR FOUR-FIVE  Posterolateral fusion/Re-operative laminectomy LUMBAR FIVE-SACRUM ONE Bilateral, Laminectomy at LUMBAR FOUR-FIVE , excision of extradural mass right LUMBAR  FIVE-SACRUM ONE  and Left LUMBAR FOUR-FIVE  with Mazor;  Surgeon: Kevan Ny Ditty, MD;  Location: [facility];  Service: Neurosurgery;  Laterality: N/A;  . APPLICATION OF ROBOTIC ASSISTANCE FOR SPINAL PROCEDURE N/A 12/10/2016   Procedure: APPLICATION OF ROBOTIC ASSISTANCE FOR SPINAL PROCEDURE;  Surgeon: Kevan Ny Ditty, MD;  Location: [facility];  Service: Neurosurgery;  Laterality: N/A;  . BACK SURGERY  2007   lumbar  . BREAST IMPLANT REMOVAL    . CARDIAC CATHETERIZATION     CAD,PCI of LAD, Cypher stent 2.5-28mm  . CARDIAC CATHETERIZATION     PCI of mid-LAD in stent restenosis with a drug-eluting stent  . CARDIAC CATHETERIZATION  06/03/13   [facility];MID LAD 100 % STENOSIS; PRIOR STENT; MID RCA 40 % STENOSIS  . COLONOSCOPY WITH PROPOFOL N/A 08/13/2018   Procedure: COLONOSCOPY WITH PROPOFOL;  Surgeon: [name], MD;  Location[facility] ENDOSCOPY;  Service: Gastroenterology;  Laterality: N/A;  . CORONARY ARTERY BYPASS GRAFT   08-12-2011   CABG x 3  . CORONARY STENT PLACEMENT    . ESOPHAGOGASTRODUODENOSCOPY (EGD) WITH PROPOFOL N/A 08/13/2018   Procedure: ESOPHAGOGASTRODUODENOSCOPY (EGD) WITH PROPOFOL;  Surgeon: [name], MD;  Location: [facility] ENDOSCOPY;  Service: Gastroenterology;  Laterality: N/A;  . LUMBAR FUSION     L1-S1  . LUMBAR PERCUTANEOUS PEDICLE SCREW 1 LEVEL N/A 12/10/2016   Procedure: Extension of pedicle screw fixation to LUMBAR FOUR;  Surgeon: Kevan Ny Ditty, MD;  Location: [facility];  Service: Neurosurgery;  Laterality: N/A;  . NASAL SINUS SURGERY  1997   tuirbinate reduction   . RECTAL EXAM UNDER ANESTHESIA N/A 10/06/2018   Procedure: RECTAL EXAM UNDER ANESTHESIA;  Surgeon: Jules Husbands, MD;  Location: [facility] ORS;  Service: General;  Laterality: N/A;    Family Psychiatric History: Reviewed family psychiatric history from my progress note on 02/05/2018  Family History:  Family History  Problem Relation Age of Onset  . Aneurysm Father        abdominal  . Diabetes Father   .  Alcohol abuse Father   . Hypertension Father   . Aneurysm Brother        abdominal  . Alcohol abuse Brother   . Hypertension Brother   . Colon cancer Brother   . Diabetes Mother   . Alcohol abuse Mother   . Hypertension Other        family hx  . Hyperlipidemia Other        family hx  . Diabetes Other        family hx    Social History: Reviewed social history from my progress note on 02/05/2018 Social History   Socioeconomic History  . Marital status: Married    Spouse name: Photographer  . Number of children: 3  . Years of education: Not on file  . Highest education level: Associate  degree: occupational, technical, or vocational program  Occupational History  . Not on file  Tobacco Use  . Smoking status: Former Smoker    Years: 16.00    Types: Cigarettes    Quit date: 06/20/2010    Years since quitting: 10.5  . Smokeless tobacco: Never Used  Vaping Use  . Vaping Use: Every day  Substance and Sexual Activity  . Alcohol use: No    Alcohol/week: 0.0 standard drinks  . Drug use: No  . Sexual activity: Yes  Other Topics Concern  . Not on file  Social History Narrative  . Not on file   Social Determinants of Health   Financial Resource Strain: Not on file  Food Insecurity: Not on file  Transportation Needs: Not on file  Physical Activity: Not on file  Stress: Not on file  Social Connections: Not on file    Allergies: No Known Allergies  Metabolic Disorder Labs: Lab Results  Component Value Date   HGBA1C 7.2 (H) 01/01/2020   MPG 159.94 01/01/2020   MPG 223.08 12/05/2017   Lab Results  Component Value Date   PROLACTIN 6.1 12/05/2017   Lab Results  Component Value Date   CHOL 238 (H) 12/05/2017   TRIG 819 (H) 12/05/2017   HDL 41 12/05/2017   CHOLHDL 5.8 12/05/2017   VLDL UNABLE TO CALCULATE IF TRIGLYCERIDE OVER 400 mg/dL 12/05/2017   LDLCALC UNABLE TO CALCULATE IF TRIGLYCERIDE OVER 400 mg/dL 12/05/2017   LDLCALC SEE COMMENT 07/09/2012   Lab Results   Component Value Date   TSH 2.600 08/03/2018   TSH 86.000 (H) 12/05/2017    Therapeutic Level Labs: No results found for: LITHIUM No results found for: VALPROATE No components found for:  CBMZ  Current Medications: Current Outpatient Medications  Medication Sig Dispense Refill  . albuterol-ipratropium (COMBIVENT) 18-103 MCG/ACT inhaler Inhale 2 puffs into the lungs daily as needed for wheezing or shortness of breath.     Marland Kitchen amLODipine (NORVASC) 10 MG tablet Take 1 tablet (10 mg total) by mouth daily. 90 tablet 3  . atorvastatin (LIPITOR) 80 MG tablet Take 1 tablet (80 mg total) by mouth daily. 90 tablet 3  . busPIRone (BUSPAR) 5 MG tablet Take 1 tablet (5 mg total) by mouth 2 (two) times daily. For anxiety 180 tablet 1  . carisoprodol (SOMA) 350 MG tablet Take 1 tablet (350 mg total) by mouth 2 (two) times daily as needed for muscle spasms.    . carvedilol (COREG) 12.5 MG tablet Take 1 tablet (12.5 mg total) by mouth 2 (two) times daily with a meal. 180 tablet 3  . DETROL LA 4 MG 24 hr capsule Take 4 mg by mouth daily.    Marland Kitchen doxepin (SINEQUAN) 50 MG capsule Take 1 capsule (50 mg total) by mouth at bedtime. 90 capsule 1  . estradiol (ESTRING) 2 MG vaginal ring Place vaginally.    . fluticasone (FLONASE) 50 MCG/ACT nasal spray Place 1 spray into both nostrils daily as needed.     . gabapentin (NEURONTIN) 300 MG capsule Take 300 mg by mouth at bedtime.     Marland Kitchen HUMULIN 70/30 KWIKPEN (70-30) 100 UNIT/ML PEN Inject 10-15 Units into the skin See admin instructions. Inject 10-15 units SQ in the morning and inject 10-12 units SQ at bedtime per sliding scale    . metFORMIN (GLUCOPHAGE-XR) 750 MG 24 hr tablet Take 750 mg by mouth daily with breakfast.     . nitroGLYCERIN (NITROSTAT) 0.4 MG SL tablet Place 1 tablet (  0.4 mg total) under the tongue every 5 (five) minutes as needed for chest pain. 25 tablet 6  . omeprazole (PRILOSEC) 40 MG capsule Take 40 mg by mouth daily.     . Oxcarbazepine (TRILEPTAL)  300 MG tablet Take 3 tablets (900 mg total) by mouth at bedtime. 270 tablet 1  . Oxycodone HCl 10 MG TABS LIMIT ONE HALF TO ONE TABLET BY MOUTH FOUR TO SIX TIMES PER DAY IF TOLERATED (NO HYDROCODONE ACETAMINOPHEN)    . RESTASIS 0.05 % ophthalmic emulsion Place 1 drop into both eyes daily as needed (for dryness).     Marland Kitchen rOPINIRole (REQUIP) 0.5 MG tablet Take 1 mg by mouth at bedtime.     . sitaGLIPtin (JANUVIA) 100 MG tablet Take 100 mg by mouth daily.    Marland Kitchen SYNTHROID 25 MCG tablet Take 25 mcg by mouth daily before breakfast.     . telmisartan (MICARDIS) 80 MG tablet Take 1 tablet (80 mg total) by mouth daily. 90 tablet 3   No current facility-administered medications for this visit.     Musculoskeletal: Strength & Muscle Tone: UTA Gait & Station: UTA Patient leans: N/A  Psychiatric Specialty Exam: Review of Systems  Musculoskeletal: Positive for back pain.  Psychiatric/Behavioral: The patient is nervous/anxious.   All other systems reviewed and are negative.   There were no vitals taken for this visit.There is no height or weight on file to calculate BMI.  General Appearance: Casual  Eye Contact:  Fair  Speech:  Clear and Coherent  Volume:  Normal  Mood:  Anxious Coping well  Affect:  Congruent  Thought Process:  Goal Directed and Descriptions of Associations: Intact  Orientation:  Full (Time, Place, and Person)  Thought Content: Logical   Suicidal Thoughts:  No  Homicidal Thoughts:  No  Memory:  Immediate;   Fair Recent;   Fair Remote;   Fair  Judgement:  Fair  Insight:  Fair  Psychomotor Activity:  Normal  Concentration:  Concentration: Fair and Attention Span: Fair  Recall:  [employer] of Knowledge: Fair  Language: Fair  Akathisia:  No  Handed:  Right  AIMS (if indicated):UTA  Assets:  Communication Skills Desire for Improvement Housing Intimacy Social Support  ADL's:  Intact  Cognition: WNL  Sleep:  Fair overall   Screenings: AIMS   Flowsheet Row Office  Visit from 12/23/2018 in [facility] Office Visit from 08/18/2018 in [facility] Total Score 5 6    PHQ2-9   [place] Office Visit from 05/09/2016 in [place] Procedure visit from 03/20/2016 in [facility] Office Visit from 03/12/2016 in [facility] Clinical Support from 02/13/2016 in [facility] Office Visit from 12/14/2015 in [facility]  PHQ-2 Total Score 0 0 0 0 0       Assessment and Plan: AYAKA ANDES is a 65 year old Caucasian female who has a history of bipolar disorder, anxiety disorder was evaluated by telemedicine today.  Patient is currently stable.  Plan as noted below.  Plan Bipolar disorder in remission Trileptal 900 mg p.o. daily  GAD-stable Trileptal as prescribed BuSpar 5 mg p.o. twice daily   Insomnia-stable Doxepin 50 mg p.o. nightly Continue melatonin as needed Sleep hygiene techniques.  High risk medication use-will order CMP, TSH.  She will go to [facility] lab.  Follow-up in  clinic in 3 months or sooner if needed.  This note was generated in part or whole with voice recognition software. Voice recognition is usually quite accurate but there are transcription errors that can and very often do occur. I apologize for any typographical errors that were not detected and corrected.      Ursula Alert, MD 01/19/2021, 11:36 AM

## 2021-01-23 ENCOUNTER — Telehealth: Payer: Self-pay

## 2021-01-23 NOTE — Telephone Encounter (Signed)
01-19-21 @ 11:30 am faxed and confimred lab work orders cmp and liver and tsh dx z79.899

## 2021-03-06 ENCOUNTER — Other Ambulatory Visit: Payer: Self-pay | Admitting: Psychiatry

## 2021-03-06 DIAGNOSIS — F3175 Bipolar disorder, in partial remission, most recent episode depressed: Secondary | ICD-10-CM

## 2021-03-15 ENCOUNTER — Other Ambulatory Visit: Payer: Self-pay | Admitting: Family Medicine

## 2021-03-15 DIAGNOSIS — Z780 Asymptomatic menopausal state: Secondary | ICD-10-CM

## 2021-03-15 DIAGNOSIS — Z1231 Encounter for screening mammogram for malignant neoplasm of breast: Secondary | ICD-10-CM

## 2021-03-26 NOTE — Progress Notes (Signed)
Patient ID: Jennifer [name], female   DOB: Nov 08, 1955, 65 y.o.   MRN: [id number] Cardiology Office Note  Date:  03/27/2021   [name], DOB [birth year], MRN [id number]  PCP:  [name]   Chief Complaint  Patient presents with  . 6 month follow up     Patient c/o shortness of breath on exertion. Medications reviewed by the patient verbally.     HPI:  65 year old woman with a history of  Medication noncompliance coronary artery disease, PTCA of the LAD with 2.5 x 28 mm Cypher stent in January 2007,  Repeat catheterization August 2011 with Stent placement of the mid LAD for 90% lesion, catheterization August 06, 2011 for chest pain that showed severe mid LAD in-stent restenosis at the site of the previous drug-eluting stent ( DES stent x2 placed in the LAD in 2007 and 2011), also with severe mid RCA disease, moderate to severe proximal diagonal #1 disease, ejection fraction 55%. bypass surgery at [facility] on August 12 2011 by Dr. Roxan Hockey. bypass graft x3 with a LIMA to the LAD, vein graft to the RCA, vein graft to the diagonal. Chronic opioid use She presents for routine follow-up of her coronary artery disease  In follow-up today reports she is doing well No trips to the ER  Continued stress at home,  "husband drinks, son takes too much medication"  She continues to see pain clinic, psychiatry  Lab work reviewed A1C 8.4 Struggles to get her numbers down Total chol 127 through PMD LDL 44  EKG personally reviewed by myself on todays visit NSR rate 76 no ST or T wave changes  Seen in the hospital July 16, 2020, ER visit for ABD pain Had  Shingles Took antiviral, sx improve  Chronic anxiety, stress at home, confusion at times. Treated by a psychiatrist in [place], started on Valium. She is tired all time, sleeping a good number of hours per day. In the past she has taken benzodiazepines, Prozac. previously on Lamictal and Cymbalta,  She sees Dr. Eliberto Ivory, also sees Dr. Leilani Merl in [place]. She does travel up to [place] to help family .   cardiac catheterization 06/03/2013. 100% occluded mid LAD at the site of a stent, mild mid RCA disease, saphenous vein graft to the diagonal and LIMA graft to the LAD are patent. Occluded saphenous vein graft to the distal RCA. Medical management was recommended.   PMH:   has a past medical history of Anemia (1985), Anginal pain ([place]), Anxiety, Arthritis, Bipolar depression ([place]), Blood dyscrasia, CAD (coronary artery disease), Cancer ([place]), COPD (chronic obstructive pulmonary disease) ([place]), CRD (chronic renal disease), Depression, Depression with anxiety, Diabetes mellitus, Dyspnea, Enlarged liver, Family history of adverse reaction to anesthesia, Fibromyalgia, Generalized anxiety disorder, GERD (gastroesophageal reflux disease), Hyperlipidemia, Hypertension, Hypothyroidism, Low back pain, Low sodium levels, Migraines, Neuropathy, Neuropathy, Persistent disorder of initiating or maintaining sleep, Restless leg, and Von Willebrand disease ([place]).  PSH:    Past Surgical History:  Procedure Laterality Date  . ABDOMINAL HYSTERECTOMY    . 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 APPLICATION OF ROBOTIC ASSISTANCE FOR SPINAL PROCEDURE N/A 12/10/2016  Procedure: APPLICATION OF ROBOTIC ASSISTANCE FOR SPINAL PROCEDURE;  Surgeon: Kevan Ny Ditty, MD;  Location: [facility];  Service: Neurosurgery;  Laterality: N/A;  . BACK SURGERY  2007   lumbar  . BREAST IMPLANT REMOVAL    . CARDIAC CATHETERIZATION      CAD,PCI of LAD, Cypher stent 2.5-28mm  . CARDIAC CATHETERIZATION     PCI of mid-LAD in stent restenosis with a drug-eluting stent  . CARDIAC CATHETERIZATION  06/03/13   [facility];MID LAD 100 % STENOSIS; PRIOR STENT; MID RCA 40 % STENOSIS  . COLONOSCOPY WITH PROPOFOL N/A 08/13/2018   Procedure: COLONOSCOPY WITH PROPOFOL;  Surgeon: [name], MD;  Location: [facility] ENDOSCOPY;  Service: Gastroenterology;  Laterality: N/A;  . CORONARY ARTERY BYPASS GRAFT   08-12-2011   CABG x 3  . CORONARY STENT PLACEMENT    . ESOPHAGOGASTRODUODENOSCOPY (EGD) WITH PROPOFOL N/A 08/13/2018   Procedure: ESOPHAGOGASTRODUODENOSCOPY (EGD) WITH PROPOFOL;  Surgeon: [name], MD;  Location: [facility] ENDOSCOPY;  Service: Gastroenterology;  Laterality: N/A;  . LUMBAR FUSION     L1-S1  . LUMBAR PERCUTANEOUS PEDICLE SCREW 1 LEVEL N/A 12/10/2016   Procedure: Extension of pedicle screw fixation to LUMBAR FOUR;  Surgeon: Kevan Ny Ditty, MD;  Location: [facility] [place];  Service: Neurosurgery;  Laterality: N/A;  . NASAL SINUS SURGERY  1997   tuirbinate reduction   . RECTAL EXAM UNDER ANESTHESIA N/A 10/06/2018   Procedure: RECTAL EXAM UNDER ANESTHESIA;  Surgeon: Jules Husbands, MD;  Location: [facility] ORS;  Service: General;  Laterality: N/A;    Current Outpatient Medications  Medication Sig Dispense Refill  . albuterol-ipratropium (COMBIVENT) 18-103 MCG/ACT inhaler Inhale 2 puffs into the lungs daily as needed for wheezing or shortness of breath.     Marland Kitchen amLODipine (NORVASC) 10 MG tablet Take 1 tablet (10 mg total) by mouth daily. 90 tablet 3  . atorvastatin (LIPITOR) 80 MG tablet Take 1 tablet (80 mg total) by mouth daily. 90 tablet 3  . busPIRone (BUSPAR) 5 MG tablet Take 1 tablet (5 mg total) by mouth 2 (two) times daily. For anxiety 180 tablet 1  . carisoprodol (SOMA) 350 MG tablet Take 1 tablet (350 mg total) by mouth 2 (two) times daily as needed for muscle spasms.    . carvedilol (COREG) 12.5 MG tablet Take 1 tablet (12.5 mg total) by  mouth 2 (two) times daily with a meal. 180 tablet 3  . DETROL LA 4 MG 24 hr capsule Take 4 mg by mouth daily.    Marland Kitchen doxepin (SINEQUAN) 50 MG capsule Take 1 capsule (50 mg total) by mouth at bedtime. 90 capsule 1  . estradiol (ESTRING) 2 MG vaginal ring Place vaginally.    . fluticasone (FLONASE) 50 MCG/ACT nasal spray Place 1 spray into both nostrils daily as needed.     . gabapentin (NEURONTIN) 300 MG capsule Take 300 mg by mouth at bedtime.     Marland Kitchen HUMULIN 70/30 KWIKPEN (70-30) 100 UNIT/ML PEN Inject 10-15 Units into the skin See admin instructions. Inject 10-15 units SQ in the morning and inject 10-12 units SQ at bedtime per sliding scale    . metFORMIN (GLUCOPHAGE-XR) 750 MG 24 hr tablet Take 750 mg by mouth daily with breakfast.     . metFORMIN (GLUCOPHAGE-XR) 750 MG 24 hr tablet Take 750 mg by mouth in the morning and at bedtime.    . nitroGLYCERIN (NITROSTAT) 0.4 MG SL tablet Place 1 tablet (0.4 mg total) under the tongue every 5 (five) minutes as needed for  chest pain. 25 tablet 6  . omeprazole (PRILOSEC) 40 MG capsule Take 40 mg by mouth daily.     . Oxcarbazepine (TRILEPTAL) 300 MG tablet TAKE 3 TABLETS AT BEDTIME 270 tablet 3  . Oxycodone HCl 10 MG TABS LIMIT ONE HALF TO ONE TABLET BY MOUTH FOUR TO SIX TIMES PER DAY IF TOLERATED (NO HYDROCODONE ACETAMINOPHEN)    . RESTASIS 0.05 % ophthalmic emulsion Place 1 drop into both eyes daily as needed (for dryness).     Marland Kitchen rOPINIRole (REQUIP) 0.5 MG tablet Take 1 mg by mouth at bedtime.     . sitaGLIPtin (JANUVIA) 100 MG tablet Take 100 mg by mouth daily.    Marland Kitchen SYNTHROID 25 MCG tablet Take 25 mcg by mouth daily before breakfast.     . telmisartan (MICARDIS) 80 MG tablet Take 1 tablet (80 mg total) by mouth daily. 90 tablet 3   No current facility-administered medications for this visit.    Allergies:   Aspirin   Social History:  The patient  reports that she quit smoking about 10 years ago. Her smoking use included cigarettes. She quit after  16.00 years of use. She has never used smokeless tobacco. She reports that she does not drink alcohol and does not use drugs.   Family History:   family history includes Alcohol abuse in her brother, father, and mother; Aneurysm in her brother and father; Colon cancer in her brother; Diabetes in her father, mother, and another family member; Hyperlipidemia in an other family member; Hypertension in her brother, father, and another family member.    Review of Systems: Review of Systems  Constitutional: Negative.   HENT: Negative.   Respiratory: Negative.   Cardiovascular: Negative.   Gastrointestinal: Negative.   Musculoskeletal: Negative.   Neurological: Negative.   Psychiatric/Behavioral: Negative.   All other systems reviewed and are negative.   PHYSICAL EXAM: VS:  BP (!) 146/80 (BP Location: Left Arm, Patient Position: Sitting, Cuff Size: Normal)   Pulse 76   Ht 5\' 3"  (1.6 m)   Wt 160 lb 8 oz (72.8 kg)   SpO2 97%   BMI 28.43 kg/m  , BMI Body mass index is 28.43 kg/m.  Constitutional:  oriented to person, place, and time. No distress.  HENT:  Head: Grossly normal Eyes:  no discharge. No scleral icterus.  Neck: No JVD, no carotid bruits  Cardiovascular: Regular rate and rhythm, no murmurs appreciated Pulmonary/Chest: Clear to auscultation bilaterally, no wheezes or rails Abdominal: Soft.  no distension.  no tenderness.  Musculoskeletal: Normal range of motion Neurological:  normal muscle tone. Coordination normal. No atrophy Skin: Skin warm and dry Psychiatric: normal affect, pleasant   Recent Labs: 07/16/2020: BUN 9; Creatinine, Ser 0.65; Hemoglobin 13.2; Platelets 259; Potassium 3.9; Sodium 128    Lipid Panel Lab Results  Component Value Date   CHOL 238 (H) 12/05/2017   HDL 41 12/05/2017   LDLCALC UNABLE TO CALCULATE IF TRIGLYCERIDE OVER 400 mg/dL 12/05/2017   TRIG 819 (H) 12/05/2017      Wt Readings from Last 3 Encounters:  03/27/21 160 lb 8 oz (72.8 kg)   09/26/20 159 lb 8 oz (72.3 kg)  07/16/20 161 lb 2.5 oz (73.1 kg)     ASSESSMENT AND PLAN:  Coronary Artery disease with chronic stable Previous stress test end of 2017 Chronic chest pain symptoms often exacerbated by stress Low risk myoview 10/20 Denies anginal symptoms, no further work-up at this time  Hyperlipidemia Continue Lipitor, goal LDL less than  40  SMOKER Vaps, nonsmoker for years , since CABG  Type 2 diabetes mellitus with other circulatory complication (HCC) Poorly controlled diabetes secondary to medication non compliance PMD following  Depression/anxiety Long history of medication noncompliance Reports having stress at home, fighting with her husband Followed by psychiatry   Total encounter time more than 25 minutes  Greater than 50% was spent in counseling and coordination of care with the patient   Orders Placed This Encounter  Procedures  . EKG 12-Lead     Signed, Esmond Plants, M.D., Ph.D. 03/27/2021  [place], [place]

## 2021-03-27 ENCOUNTER — Encounter: Payer: Self-pay | Admitting: Cardiovascular Disease

## 2021-03-27 ENCOUNTER — Ambulatory Visit (INDEPENDENT_AMBULATORY_CARE_PROVIDER_SITE_OTHER): Payer: Medicare Other | Admitting: Cardiovascular Disease

## 2021-03-27 ENCOUNTER — Other Ambulatory Visit: Payer: Self-pay

## 2021-03-27 VITALS — BP 146/80 | HR 76 | Ht 63.0 in | Wt 160.5 lb

## 2021-03-27 DIAGNOSIS — I10 Essential (primary) hypertension: Secondary | ICD-10-CM

## 2021-03-27 DIAGNOSIS — E1159 Type 2 diabetes mellitus with other circulatory complications: Secondary | ICD-10-CM | POA: Diagnosis not present

## 2021-03-27 DIAGNOSIS — I25118 Atherosclerotic heart disease of native coronary artery with other forms of angina pectoris: Secondary | ICD-10-CM

## 2021-03-27 DIAGNOSIS — F17200 Nicotine dependence, unspecified, uncomplicated: Secondary | ICD-10-CM

## 2021-03-27 DIAGNOSIS — E782 Mixed hyperlipidemia: Secondary | ICD-10-CM | POA: Diagnosis not present

## 2021-03-27 DIAGNOSIS — I739 Peripheral vascular disease, unspecified: Secondary | ICD-10-CM

## 2021-03-27 MED ORDER — ATORVASTATIN CALCIUM 80 MG PO TABS: 80.0000 mg | ORAL_TABLET | Freq: Every day | ORAL | 3 refills | Status: AC

## 2021-03-27 NOTE — Patient Instructions (Addendum)
Medication Instructions: Your physician recommends that you continue on your current medications as directed. Please refer to the Current Medication list given to you today.  Stay on lipitor   If you need a refill on your cardiac medications before your next appointment, please call your pharmacy.   Lab work: No new labs needed  If you have labs (blood work) drawn today and your tests are completely normal, you will receive your results only by: Marland Kitchen MyChart Message (if you have MyChart) OR . A paper copy in the mail If you have any lab test that is abnormal or we need to change your treatment, we will call you to review the results.   Testing/Procedures: No new testing needed   Follow-Up: At [facility], you and your health needs are our priority.  As part of our continuing mission to provide you with exceptional heart care, we have created designated Provider Care Teams.  These Care Teams include your primary Cardiologist (physician) and Advanced Practice Providers (APPs -  Physician Assistants and Nurse Practitioners) who all work together to provide you with the care you need, when you need it.  . You will need a follow up appointment in 12 months  . Providers on your designated Care Team:   . Murray Hodgkins, NP . Christell Faith, PA-C . Marrianne Mood, PA-C  Any Other Special Instructions Will Be Listed Below (If Applicable).  COVID-19 Vaccine Information can be found at: [email or web address] For questions related to vaccine distribution or appointments, please email [email or web address] or call [phone].

## 2021-04-10 ENCOUNTER — Telehealth (INDEPENDENT_AMBULATORY_CARE_PROVIDER_SITE_OTHER): Payer: Medicare Other | Admitting: Psychiatry

## 2021-04-10 ENCOUNTER — Encounter: Payer: Self-pay | Admitting: Psychiatry

## 2021-04-10 ENCOUNTER — Other Ambulatory Visit: Payer: Self-pay

## 2021-04-10 DIAGNOSIS — F5105 Insomnia due to other mental disorder: Secondary | ICD-10-CM

## 2021-04-10 DIAGNOSIS — Z79899 Other long term (current) drug therapy: Secondary | ICD-10-CM

## 2021-04-10 DIAGNOSIS — I25118 Atherosclerotic heart disease of native coronary artery with other forms of angina pectoris: Secondary | ICD-10-CM

## 2021-04-10 DIAGNOSIS — F411 Generalized anxiety disorder: Secondary | ICD-10-CM | POA: Diagnosis not present

## 2021-04-10 DIAGNOSIS — F3176 Bipolar disorder, in full remission, most recent episode depressed: Secondary | ICD-10-CM

## 2021-04-10 MED ORDER — DOXEPIN HCL 10 MG PO CAPS
10.0000 mg | ORAL_CAPSULE | Freq: Every evening | ORAL | 1 refills | Status: DC | PRN
Start: 2021-04-10 — End: 2021-09-04

## 2021-04-10 MED ORDER — FLUOXETINE HCL 20 MG PO CAPS: 20.0000 mg | ORAL_CAPSULE | Freq: Every day | ORAL | 1 refills | Status: DC

## 2021-04-10 MED ORDER — FLUOXETINE HCL 20 MG PO CAPS
20.0000 mg | ORAL_CAPSULE | Freq: Every day | ORAL | 1 refills | Status: DC
Start: 2021-04-10 — End: 2021-04-10

## 2021-04-10 NOTE — Patient Instructions (Signed)
Fluoxetine Capsules or Tablets (Depression/Mood Disorders) What is this medication? FLUOXETINE (floo OX e teen) treats depression, anxiety, obsessive-compulsive disorder (OCD), and eating disorders. It increases the amount of serotonin in the brain, a hormone that helps regulate mood. It belongs to a group ofmedications called SSRIs. This medicine may be used for other purposes; ask your health care provider orpharmacist if you have questions. COMMON BRAND NAME(S): Prozac What should I tell my care team before I take this medication? They need to know if you have any of these conditions: Bipolar disorder or a family history of bipolar disorder Bleeding disorders Glaucoma Heart disease Liver disease Low levels of sodium in the blood Seizures Suicidal thoughts, plans, or attempt; a previous suicide attempt by you or a family member Take MAOIs like Carbex, Eldepryl, Marplan, Nardil, and Parnate Take medications that treat or prevent blood clots Thyroid disease An unusual or allergic reaction to fluoxetine, other medications, foods, dyes, or preservatives Pregnant or trying to get pregnant Breast-feeding How should I use this medication? Take this medication by mouth with a glass of water. Follow the directions on the prescription label. You can take this medication with or without food. Take your medication at regular intervals. Do not take it more often than directed. Do not stop taking this medication suddenly except upon the advice of your care team. Stopping this medication too quickly may cause serious side effects oryour condition may worsen. A special MedGuide will be given to you by the pharmacist with eachprescription and refill. Be sure to read this information carefully each time. Talk to your care team about the use of this medication in children. While it may be prescribed for children as young as 7 years for selected conditions,precautions do apply. Overdosage: If you think you  have taken too much of this medicine contact apoison control center or emergency room at once. NOTE: This medicine is only for you. Do not share this medicine with others. What if I miss a dose? If you miss a dose, skip the missed dose and go back to your regular dosingschedule. Do not take double or extra doses. What may interact with this medication? Do not take this medication with any of the following: Other medications containing fluoxetine, like Sarafem or Symbyax Cisapride Dronedarone Linezolid MAOIs like Carbex, Eldepryl, Marplan, Nardil, and Parnate Methylene blue (injected into a vein) Pimozide Thioridazine This medication may also interact with the following: Alcohol Amphetamines Aspirin and aspirin-like medications Carbamazepine Certain medications for depression, anxiety, or psychotic disturbances Certain medications for migraine headaches like almotriptan, eletriptan, frovatriptan, naratriptan, rizatriptan, sumatriptan, zolmitriptan Digoxin Diuretics Fentanyl Flecainide Furazolidone Isoniazid Lithium Medications for sleep Medications that treat or prevent blood clots like warfarin, enoxaparin, and dalteparin NSAIDs, medications for pain and inflammation, like ibuprofen or naproxen Other medications that prolong the QT interval (an abnormal heart rhythm) Phenytoin Procarbazine Propafenone Rasagiline Ritonavir Supplements like St. John's wort, kava kava, valerian Tramadol Tryptophan Vinblastine This list may not describe all possible interactions. Give your health care provider a list of all the medicines, herbs, non-prescription drugs, or dietary supplements you use. Also tell them if you smoke, drink alcohol, or use illegaldrugs. Some items may interact with your medicine. What should I watch for while using this medication? Tell your care team if your symptoms do not get better or if they get worse. Visit your care team for regular checks on your progress.  Because it may take several weeks to see the full effects of this medication, it is important  tocontinue your treatment as prescribed. Watch for new or worsening thoughts of suicide or depression. This includes sudden changes in mood, behavior, or thoughts. These changes can happen at any time but are more common in the beginning of treatment or after a change in dose. Call your care team right away if you experience these thoughts orworsening depression. Manic episodes may happen in patients with bipolar disorder who take this medication. Watch for changes in feelings or behaviors such as feeling anxious, nervous, agitated, panicky, irritable, hostile, aggressive, impulsive, severely restless, overly excited and hyperactive, or trouble sleeping. These symptoms can happen at anytime but are more common in the beginning of treatment or after a change in dose. Call your care team right away if you notice any ofthese symptoms. You may get drowsy or dizzy. Do not drive, use machinery, or do anything that needs mental alertness until you know how this medication affects you. Do not stand or sit up quickly, especially if you are an older patient. This reduces the risk of dizzy or fainting spells. Alcohol may interfere with the effect ofthis medication. Avoid alcoholic drinks. Your mouth may get dry. Chewing sugarless gum or sucking hard candy, and drinking plenty of water may help. Contact your care team if the problem doesnot go away or is severe. This medication may affect blood sugar levels. If you have diabetes, check withyour care team before you make changes to your diet or medications. What side effects may I notice from receiving this medication? Side effects that you should report to your care team as soon as possible: Allergic reactions-skin rash, itching, hives, swelling of the face, lips, tongue, or throat Bleeding-bloody or black, tar-like stools, red or dark brown urine, vomiting blood or brown  material that looks like coffee grounds, small, red or purple spots on skin, unusual bleeding or bruising Heart rhythm changes-fast or irregular heartbeat, dizziness, feeling faint or lightheaded, chest pain, trouble breathing Loss of appetite with weight loss Low sodium level-muscle weakness, fatigue, dizziness, headache, confusion Serotonin syndrome-irritability, confusion, fast or irregular heartbeat, muscle stiffness, twitching muscles, sweating, high fever, seizure, chills, vomiting, diarrhea Sudden eye pain or change in vision such as blurry vision, seeing halos around lights, vision loss Thoughts of suicide or self-harm, worsening mood, feelings of depression Side effects that usually do not require medical attention (report to your careteam if they continue or are bothersome): Anxiety, nervousness Change in sex drive or performance Diarrhea Dry mouth Headache Excessive sweating Nausea Tremors or shaking Trouble sleeping Upset stomach This list may not describe all possible side effects. Call your doctor for medical advice about side effects. You may report side effects to FDA at1-800-FDA-1088. Where should I keep my medication? Keep out of the reach of children and pets. Store at room temperature between 15 and 30 degrees C (59 and 86 degrees F).Get rid of any unused medication after the expiration date. NOTE: This sheet is a summary. It may not cover all possible information. If you have questions about this medicine, talk to your doctor, pharmacist, orhealth care provider.  2022 Elsevier/Gold Standard (2020-12-26 14:28:49) Doxepin capsules What is this medication? DOXEPIN (DOX e pin) is used to treat depression and anxiety. This medicine may be used for other purposes; ask your health care provider orpharmacist if you have questions. COMMON BRAND NAME(S): Sinequan What should I tell my care team before I take this medication? They need to know if you have any of these  conditions: bipolar disorder difficulty passing urine glaucoma  heart disease if you frequently drink alcohol containing drinks liver disease lung or breathing disease, like asthma or sleep apnea prostate trouble schizophrenia seizures suicidal thoughts, plans, or attempt; a previous suicide attempt by you or a family member an unusual or allergic reaction to doxepin, other medicines, foods, dyes, or preservatives pregnant or trying to get pregnant breast-feeding How should I use this medication? Take this medicine by mouth with a glass of water. Follow the directions on the prescription label. Take your doses at regular intervals. Do not take your medicine more often than directed. Do not stop taking this medicine suddenly except upon the advice of your doctor. Stopping this medicine too quickly maycause serious side effects or your condition may worsen. A special MedGuide will be given to you by the pharmacist with eachprescription and refill. Be sure to read this information carefully each time. Talk to your pediatrician regarding the use of this medicine in children. While this drug may be prescribed for children as young as 12 years for selectedconditions, precautions do apply. Overdosage: If you think you have taken too much of this medicine contact apoison control center or emergency room at once. NOTE: This medicine is only for you. Do not share this medicine with others. What if I miss a dose? If you miss a dose, take it as soon as you can. If it is almost time for yournext dose, take only that dose. Do not take double or extra doses. What may interact with this medication? Do not take this medicine with any of the following medications: arsenic trioxide certain medicines used to regulate abnormal heartbeat or to treat other heart conditions cisapride halofantrine levomethadyl linezolid MAOIs like Carbex, Eldepryl, Marplan, Nardil, and Parnate methylene blue other medicines  for mental depression phenothiazines like perphenazine, thioridazine and chlorpromazine pimozide procarbazine sparfloxacin St. John's Wort This medicine may also interact with the following medications: cimetidine tolazamide ziprasidone This list may not describe all possible interactions. Give your health care provider a list of all the medicines, herbs, non-prescription drugs, or dietary supplements you use. Also tell them if you smoke, drink alcohol, or use illegaldrugs. Some items may interact with your medicine. What should I watch for while using this medication? Visit your doctor or health care professional for regular checks on your progress. It can take several days before you feel the full effect of this medicine. If you have been taking this medicine regularly for some time, do not suddenly stop taking it. You must gradually reduce the dose or you may get severe side effects. Ask your doctor or health care professional for advice. Even after you stop taking this medicine it can still affect your body forseveral days. Patients and their families should watch out for new or worsening thoughts of suicide or depression. Also watch out for sudden changes in feelings such as feeling anxious, agitated, panicky, irritable, hostile, aggressive, impulsive, severely restless, overly excited and hyperactive, or not being able to sleep. If this happens, especially at the beginning of treatment or after a change indose, call your health care professional. Dennis Bast may get drowsy or dizzy. Do not drive, use machinery, or do anything that needs mental alertness until you know how this medicine affects you. Do not stand or sit up quickly, especially if you are an older patient. This reduces the risk of dizzy or fainting spells. Alcohol may increase dizziness anddrowsiness. Avoid alcoholic drinks. Do not treat yourself for coughs, colds, or allergies without asking your doctor or health care  professional for  advice. Some ingredients can increasepossible side effects. Your mouth may get dry. Chewing sugarless gum or sucking hard candy, and drinking plenty of water may help. Contact your doctor if the problem does notgo away or is severe. This medicine may cause dry eyes and blurred vision. If you wear contact lenses you may feel some discomfort. Lubricating drops may help. See your eye doctorif the problem does not go away or is severe. This medicine can make you more sensitive to the sun. Keep out of the sun. If you cannot avoid being in the sun, wear protective clothing and use sunscreen.Do not use sun lamps or tanning beds/booths. What side effects may I notice from receiving this medication? Side effects that you should report to your doctor or health care professionalas soon as possible: allergic reactions like skin rash, itching or hives, swelling of the face, lips, or tongue anxious breathing problems changes in vision confusion elevated mood, decreased need for sleep, racing thoughts, impulsive behavior eye pain fast, irregular heartbeat feeling faint or lightheaded, falls feeling agitated, angry, or irritable fever with increased sweating hallucination, loss of contact with reality seizures stiff muscles suicidal thoughts or other mood changes tingling, pain, or numbness in the feet or hands trouble passing urine or change in the amount of urine trouble sleeping unusually weak or tired vomiting yellowing of the eyes or skin Side effects that usually do not require medical attention (report to yourdoctor or health care professional if they continue or are bothersome): change in sex drive or performance change in appetite or weight constipation dizziness dry mouth nausea tired tremors upset stomach This list may not describe all possible side effects. Call your doctor for medical advice about side effects. You may report side effects to FDA at1-800-FDA-1088. Where should I  keep my medication? Keep out of the reach of children. Store at room temperature between 15 and 30 degrees C (59 and 86 degrees F).Throw away any unused medicine after the expiration date. NOTE: This sheet is a summary. It may not cover all possible information. If you have questions about this medicine, talk to your doctor, pharmacist, orhealth care provider.  2022 Elsevier/Gold Standard (2018-10-06 13:13:29)

## 2021-04-10 NOTE — Progress Notes (Signed)
Virtual Visit via Video Note  I connected with Jennifer [name] on 04/10/21 at  2:00 PM EDT by a video enabled telemedicine application and verified that I am speaking with the correct person using two identifiers.  Location Provider Location : [facility] Patient Location : Home  Participants: Patient , Provider    I discussed the limitations of evaluation and management by telemedicine and the availability of in person appointments. The patient expressed understanding and agreed to proceed.   I discussed the assessment and treatment plan with the patient. The patient was provided an opportunity to ask questions and all were answered. The patient agreed with the plan and demonstrated an understanding of the instructions.   The patient was advised to call back or seek an in-person evaluation if the symptoms worsen or if the condition fails to improve as anticipated.   [facility] MD OP Progress Note  04/10/2021 3:35 PM DAJAH FISCHMAN  MRN:  [id number]  Chief Complaint:  Chief Complaint   Follow-up; Anxiety; Depression    HPI: Jennifer [name] is a 65 year old Caucasian female, lives in [place], married, has a history of bipolar disorder, GAD, insomnia, neuroleptic induced dystonia, chronic pain was evaluated by telemedicine today.  Patient today reports she does not have any sadness or mood swings however she continues to struggle with a lot of fatigue, low energy level and concentration problems.  She reports she also struggles with anxiety and feels anxious and nervous and on edge often.  She continues to worry about different things and has trouble relaxing.  This has been getting worse since the past several months.  Patient reports she has been noncompliant with her BuSpar since she felt it made her too tired and felt it was not helpful.  She wonders whether she can try Prozac which helped her in the past.  She reports the Prozac helped her with her energy level when she tried it in the past  and would like to try it again.  Patient reports sleep as restless.  It is mostly because she does not have a good sleep hygiene.  She reports she goes to bed very late at around 1 or 2 AM most of the time.  There was a night recently when she went to bed at 5 AM since she was up doing things like watching TV.  She reports she considers that time her ' Me time ", since that is the time she can spend some time for herself.  She reports it also helps her to wind down.  However the 5 AM was an exception and usually she tries to get in bed by 1 AM or 2 AM.  She reports the doxepin does help her to sleep.  Patient continues to have pain and continues to be on oxycodone.  Patient denies any suicidality, homicidality or perceptual disturbances.  Patient denies any other concerns today.  Visit Diagnosis:    ICD-10-CM   1. Bipolar disorder, in full remission, most recent episode depressed ([place])  F31.76     2. Generalized anxiety disorder  F41.1 FLUoxetine (PROZAC) 20 MG capsule    DISCONTINUED: FLUoxetine (PROZAC) 20 MG capsule    3. Insomnia due to mental condition  F51.05 doxepin (SINEQUAN) 10 MG capsule   anxiety    4. High risk medication use  Y17.494 Basic metabolic panel    CBC With Diff/Platelet    10-Hydroxycarbazepine      Past Psychiatric History: I have reviewed past psychiatric history  from progress note on 02/05/2018  Past Medical History:  Past Medical History:  Diagnosis Date   Anemia 1985   bled during a cervical biopsy, led to bleed transfusion    Anginal pain ([place])    Anxiety    Arthritis    pt. reports that her neck is stiff    Bipolar depression ([place])    Blood dyscrasia    reported hx [name] '85; saw hematologist Dr. 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 Gar [facility]    PCP   CRD (chronic renal disease)    Depression    Depression with anxiety    Diabetes mellitus    Dyspnea    W/ EXERTION    Enlarged liver    Family history of adverse reaction to anesthesia    father woke up slowly   Fibromyalgia    Generalized anxiety disorder    GERD (gastroesophageal reflux disease)    Hyperlipidemia    Hypertension    Hypothyroidism    Low back pain    Low sodium levels    Migraines    Neuropathy    Neuropathy    Persistent disorder of initiating or maintaining sleep    Restless leg    Von Willebrand disease ([place])     Past Surgical History:  Procedure Laterality Date   ABDOMINAL HYSTERECTOMY     ANTERIOR LAT LUMBAR FUSION N/A 12/10/2016   Procedure: LUMBAR FOUR-FIVE  Anterior lateral lumbar interbody fusion with LUMBAR FOUR-FIVE  Posterolateral fusion/Re-operative laminectomy LUMBAR FIVE-SACRUM ONE Bilateral, Laminectomy at LUMBAR FOUR-FIVE , 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  and Left LUMBAR FOUR-FIVE  with Mazor;  Surgeon: Kevan Ny Ditty, MD;  Location: [facility];  Service: Neurosurgery;  Laterality: N/A;   APPLICATION OF ROBOTIC ASSISTANCE FOR SPINAL PROCEDURE N/A 12/10/2016   Procedure: APPLICATION OF ROBOTIC ASSISTANCE FOR SPINAL PROCEDURE;  Surgeon: Kevan Ny Ditty, MD;  Location: [place];  Service: Neurosurgery;  Laterality: N/A;   BACK SURGERY  2007   lumbar   BREAST IMPLANT REMOVAL     CARDIAC CATHETERIZATION     CAD,PCI of LAD, Cypher stent 2.5-28mm   CARDIAC CATHETERIZATION     PCI of mid-LAD in stent restenosis with a drug-eluting stent   CARDIAC CATHETERIZATION  06/03/13   [facility];MID LAD 100 % STENOSIS; PRIOR STENT; MID RCA 40 % STENOSIS   COLONOSCOPY WITH PROPOFOL N/A 08/13/2018   Procedure: COLONOSCOPY WITH PROPOFOL;  Surgeon: [name], MD;  Location[facility] END[facility]COPY;  Service: Gastroenterology;  Laterality: N/A;   CORONARY ARTERY BYPASS GRAFT   08-12-2011   CABG x 3   CORONARY STENT  PLACEMENT     ESOPHAGOGASTRODUODENOSCOPY (EGD) WITH PROPOFOL N/A 08/13/2018   Procedure: ESOPHAGOGASTRODUODENOSCOPY (EGD) WITH PROPOFOL;  Surgeon: [name], MD;  Location: [facility] ENDOSCOPY;  Service: Gastroenterology;  Laterality: N/A;   LUMBAR FUSION     L1-S1   LUMBAR PERCUTANEOUS PEDICLE SCREW 1 LEVEL N/A 12/10/2016   Procedure: Extension of pedicle screw fixation to LUMBAR FOUR;  Surgeon: Kevan Ny Ditty, MD;  Location: [place];  Service: Neurosurgery;  Laterality: N/A;   NASAL SINUS SURGERY  1997   tuirbinate reduction    RECTAL EXAM UNDER ANESTHESIA N/A  10/06/2018   Procedure: RECTAL EXAM UNDER ANESTHESIA;  Surgeon: Jules Husbands, MD;  Location: [facility] ORS;  Service: General;  Laterality: N/A;    Family Psychiatric History: I have reviewed family psychiatric history from progress note on 02/05/2018  Family History:  Family History  Problem Relation Age of Onset   Aneurysm Father        abdominal   Diabetes Father    Alcohol abuse Father    Hypertension Father    Aneurysm Brother        abdominal   Alcohol abuse Brother    Hypertension Brother    Colon cancer Brother    Diabetes Mother    Alcohol abuse Mother    Hypertension Other        family hx   Hyperlipidemia Other        family hx   Diabetes Other        family hx    Social History: Reviewed social history from progress note on 02/05/2018 Social History   Socioeconomic History   Marital status: Married    Spouse name: Photographer   Number of children: 3   Years of education: Not on file   Highest education level: Associate degree: occupational, Hotel manager, or vocational program  Occupational History   Not on file  Tobacco Use   Smoking status: Former    Years: 16.00    Pack years: 0.00    Types: Cigarettes    Quit date: 06/20/2010    Years since quitting: 10.8   Smokeless tobacco: Never  Vaping Use   Vaping Use: Every day  Substance and Sexual Activity   Alcohol use: No    Alcohol/week: 0.0 standard  drinks   Drug use: No   Sexual activity: Yes  Other Topics Concern   Not on file  Social History Narrative   Not on file   Social Determinants of Health   Financial Resource Strain: Not on file  Food Insecurity: Not on file  Transportation Needs: Not on file  Physical Activity: Not on file  Stress: Not on file  Social Connections: Not on file    Allergies:  Allergies  Allergen Reactions   Aspirin Other (See Comments)    Metabolic Disorder Labs: Lab Results  Component Value Date   HGBA1C 7.2 (H) 01/01/2020   MPG 159.94 01/01/2020   MPG 223.08 12/05/2017   Lab Results  Component Value Date   PROLACTIN 6.1 12/05/2017   Lab Results  Component Value Date   CHOL 238 (H) 12/05/2017   TRIG 819 (H) 12/05/2017   HDL 41 12/05/2017   CHOLHDL 5.8 12/05/2017   VLDL UNABLE TO CALCULATE IF TRIGLYCERIDE OVER 400 mg/dL 12/05/2017   LDLCALC UNABLE TO CALCULATE IF TRIGLYCERIDE OVER 400 mg/dL 12/05/2017   LDLCALC SEE COMMENT 07/09/2012   Lab Results  Component Value Date   TSH 2.600 08/03/2018   TSH 86.000 (H) 12/05/2017    Therapeutic Level Labs: No results found for: LITHIUM No results found for: VALPROATE No components found for:  CBMZ  Current Medications: Current Outpatient Medications  Medication Sig Dispense Refill   doxepin (SINEQUAN) 10 MG capsule Take 1-2 capsules (10-20 mg total) by mouth at bedtime as needed. 60 capsule 1   albuterol-ipratropium (COMBIVENT) 18-103 MCG/ACT inhaler Inhale 2 puffs into the lungs daily as needed for wheezing or shortness of breath.      amLODipine (NORVASC) 10 MG tablet Take 1 tablet (10 mg total) by mouth daily. 90 tablet 3  atorvastatin (LIPITOR) 80 MG tablet Take 1 tablet (80 mg total) by mouth daily. 90 tablet 3   carisoprodol (SOMA) 350 MG tablet Take 1 tablet (350 mg total) by mouth 2 (two) times daily as needed for muscle spasms.     carvedilol (COREG) 12.5 MG tablet Take 1 tablet (12.5 mg total) by mouth 2 (two) times daily  with a meal. 180 tablet 3   DETROL LA 4 MG 24 hr capsule Take 4 mg by mouth daily.     estradiol (ESTRING) 2 MG vaginal ring Place vaginally.     FLUoxetine (PROZAC) 20 MG capsule Take 1 capsule (20 mg total) by mouth daily with breakfast. 30 capsule 1   fluticasone (FLONASE) 50 MCG/ACT nasal spray Place 1 spray into both nostrils daily as needed.      gabapentin (NEURONTIN) 300 MG capsule Take 300 mg by mouth at bedtime.      HUMULIN 70/30 KWIKPEN (70-30) 100 UNIT/ML PEN Inject 10-15 Units into the skin See admin instructions. Inject 10-15 units SQ in the morning and inject 10-12 units SQ at bedtime per sliding scale     metFORMIN (GLUCOPHAGE-XR) 750 MG 24 hr tablet Take 750 mg by mouth daily with breakfast.      metFORMIN (GLUCOPHAGE-XR) 750 MG 24 hr tablet Take 750 mg by mouth in the morning and at bedtime.     nitroGLYCERIN (NITROSTAT) 0.4 MG SL tablet Place 1 tablet (0.4 mg total) under the tongue every 5 (five) minutes as needed for chest pain. 25 tablet 6   omeprazole (PRILOSEC) 40 MG capsule Take 40 mg by mouth daily.      Oxcarbazepine (TRILEPTAL) 300 MG tablet TAKE 3 TABLETS AT BEDTIME 270 tablet 3   Oxycodone HCl 10 MG TABS LIMIT ONE HALF TO ONE TABLET BY MOUTH FOUR TO SIX TIMES PER DAY IF TOLERATED (NO HYDROCODONE ACETAMINOPHEN)     RESTASIS 0.05 % ophthalmic emulsion Place 1 drop into both eyes daily as needed (for dryness).      rOPINIRole (REQUIP) 0.5 MG tablet Take 1 mg by mouth at bedtime.      sitaGLIPtin (JANUVIA) 100 MG tablet Take 100 mg by mouth daily.     SYNTHROID 25 MCG tablet Take 25 mcg by mouth daily before breakfast.      telmisartan (MICARDIS) 80 MG tablet Take 1 tablet (80 mg total) by mouth daily. 90 tablet 3   No current facility-administered medications for this visit.     Musculoskeletal: Strength & Muscle Tone:  UTA Gait & Station:  UTA Patient leans: N/A  Psychiatric Specialty Exam: Review of Systems  Constitutional:  Positive for fatigue.   Musculoskeletal:  Positive for arthralgias and back pain.  Psychiatric/Behavioral:  Positive for decreased concentration and sleep disturbance. The patient is nervous/anxious.   All other systems reviewed and are negative.  There were no vitals taken for this visit.There is no height or weight on file to calculate BMI.  General Appearance: Casual  Eye Contact:  Fair  Speech:  Clear and Coherent  Volume:  Normal  Mood:  Anxious  Affect:  Congruent  Thought Process:  Goal Directed and Descriptions of Associations: Intact  Orientation:  Full (Time, Place, and Person)  Thought Content: Logical   Suicidal Thoughts:  No  Homicidal Thoughts:  No  Memory:  Immediate;   Fair Recent;   Fair Remote;   Fair  Judgement:  Fair  Insight:  Fair  Psychomotor Activity:  Normal  Concentration:  Concentration: Good and  Attention Span: Good  Recall:  Good  Fund of Knowledge: Good  Language: Fair  Akathisia:  No  Handed:  Right  AIMS (if indicated): not done  Assets:  Communication Skills Desire for Improvement Social Support Transportation  ADL's:  Intact  Cognition: WNL  Sleep:   restless   Screenings: [place] Office Visit from 12/23/2018 in [facility] Office Visit from 08/18/2018 in [facility] Total Score 5 6      GAD-7    Flowsheet Row Video Visit from 04/10/2021 in [facility]  Total GAD-7 Score 16      PHQ2-9    [place] Video Visit from 04/10/2021 in [facility] Office Visit from 05/09/2016 in [facility] Procedure visit from 03/20/2016 in [facility] Office Visit from 03/12/2016 in [facility] from 02/13/2016 in [facility]  PHQ-2 Total Score 2 0 0 0 0   PHQ-9 Total Score 7 -- -- -- --      Flowsheet Row Video Visit from 04/10/2021 in [facility] [place]        Assessment and Plan: Jennifer [name] is a 65 year old Caucasian female who has a history of bipolar disorder, anxiety disorder was evaluated by telemedicine today.  Patient is currently struggling with fatigue, low energy and anxiety.  Patient however with recent hyponatremia, will benefit from repeat labs.  She will benefit from the following plan.  Plan Bipolar disorder in remission Trileptal 900 mg p.o. daily  GAD-unstable Trileptal as prescribed Discontinue BuSpar for noncompliance Start Prozac 20 mg p.o. daily with breakfast.  However advised patient to hold it until we get a repeat sodium level. She is not interested in psychotherapy sessions.  Discussed the same with her.  Insomnia-restless Patient to work on sleep hygiene techniques We will reduce doxepin to 10 to 20 mg p.o. nightly as needed since the Prozac is being added. Discussed serotonin syndrome, drug to drug interaction. Continue melatonin as needed  High risk medication use- I have reviewed labs in EHR dated 02/20/2021-CMP-sodium level low at 128, chloride low at 92, alkaline phosphatase elevated at 112 TSH-within normal limits-1.90 Lipid panel-within normal limits Hemoglobin A1c-8.4-elevated  Writer discussed with patient concerns about her sodium being low.  We will order repeat BMP, CBC with differential, Trileptal level.  Patient advised to go to the [facility] lab tomorrow morning before she takes her morning medications. Also advised patient to hold the Prozac until we can review repeat sodium level.  Advised her not to start it.  Follow-up in clinic in 4 to 5 weeks or sooner if needed.  This note was generated in part or whole with voice recognition software. Voice recognition is usually quite accurate but there are transcription errors that can and  very often do occur. I apologize for any typographical errors that were not detected and corrected.       Ursula Alert, MD 04/11/2021, 9:51 AM

## 2021-04-11 ENCOUNTER — Other Ambulatory Visit
Admission: RE | Admit: 2021-04-11 | Discharge: 2021-04-11 | Disposition: A | Discharge status: Home / Self Care | Payer: Medicare Other | Attending: Psychiatry | Admitting: Psychiatry

## 2021-04-11 DIAGNOSIS — Z79899 Other long term (current) drug therapy: Secondary | ICD-10-CM

## 2021-04-11 LAB — BASIC METABOLIC PANEL
Anion gap: 9 (ref 5–15)
BUN: 10 mg/dL (ref 8–23)
CO2: 24 mmol/L (ref 22–32)
Calcium: 9 mg/dL (ref 8.9–10.3)
Chloride: 91 mmol/L — ABNORMAL LOW (ref 98–111)
Creatinine, Ser: 0.55 mg/dL (ref 0.44–1.00)
GFR, Estimated: >60 mL/min (ref >60)
Glucose, Bld: 187 mg/dL — ABNORMAL HIGH (ref 70–99)
Potassium: 3.9 mmol/L (ref 3.5–5.1)
Sodium: 124 mmol/L — ABNORMAL LOW (ref 135–145)

## 2021-04-13 ENCOUNTER — Telehealth: Payer: Self-pay | Admitting: Psychiatry

## 2021-04-13 DIAGNOSIS — F3175 Bipolar disorder, in partial remission, most recent episode depressed: Secondary | ICD-10-CM

## 2021-04-13 MED ORDER — OXCARBAZEPINE 300 MG PO TABS: 900.0000 mg | ORAL_TABLET | Freq: Every day | ORAL | 3 refills | Status: DC

## 2021-04-13 NOTE — Telephone Encounter (Signed)
Contacted patient to discuss her sodium level reviewed as low at 124.  Discussed with patient to hold Trileptal.  Discussed with patient not to start Prozac.  Advised patient to contact primary care for further management.

## 2021-05-14 ENCOUNTER — Encounter: Payer: Self-pay | Admitting: Psychiatry

## 2021-05-14 ENCOUNTER — Other Ambulatory Visit
Admission: RE | Admit: 2021-05-14 | Discharge: 2021-05-14 | Disposition: A | Discharge status: Home / Self Care | Payer: Medicare Other | Attending: Psychiatry | Admitting: Psychiatry

## 2021-05-14 ENCOUNTER — Telehealth (INDEPENDENT_AMBULATORY_CARE_PROVIDER_SITE_OTHER): Payer: Medicare Other | Admitting: Psychiatry

## 2021-05-14 ENCOUNTER — Other Ambulatory Visit: Payer: Self-pay

## 2021-05-14 DIAGNOSIS — E871 Hypo-osmolality and hyponatremia: Secondary | ICD-10-CM | POA: Diagnosis not present

## 2021-05-14 DIAGNOSIS — F411 Generalized anxiety disorder: Secondary | ICD-10-CM | POA: Diagnosis not present

## 2021-05-14 DIAGNOSIS — Z5181 Encounter for therapeutic drug level monitoring: Secondary | ICD-10-CM | POA: Diagnosis present

## 2021-05-14 DIAGNOSIS — F3176 Bipolar disorder, in full remission, most recent episode depressed: Secondary | ICD-10-CM | POA: Insufficient documentation

## 2021-05-14 DIAGNOSIS — F5105 Insomnia due to other mental disorder: Secondary | ICD-10-CM | POA: Insufficient documentation

## 2021-05-14 DIAGNOSIS — Z79899 Other long term (current) drug therapy: Secondary | ICD-10-CM | POA: Insufficient documentation

## 2021-05-14 DIAGNOSIS — I25118 Atherosclerotic heart disease of native coronary artery with other forms of angina pectoris: Secondary | ICD-10-CM | POA: Diagnosis not present

## 2021-05-14 LAB — SODIUM: Sodium: 139 mmol/L (ref 135–145)

## 2021-05-14 NOTE — Patient Instructions (Signed)
Hyponatremia Hyponatremia is when the amount of salt (sodium) in your blood is too low. When salt levels are low, your body may take in extra water. This can cause swelling throughout the body. The swelling oftenaffects the brain. What are the causes? This condition may be caused by: Certain medical problems or conditions. Vomiting a lot. Having watery poop (diarrhea) often. Certain medicines or illegal drugs. Not having enough water in the body (dehydration). Drinking too much water. Eating a diet that is low in salt. Large burns on your body. Too much sweating. What increases the risk? You are more likely to get this condition if you: Have long-term (chronic) kidney disease. Have heart failure. Have a medical condition that causes you to have watery poop often. Do very hard exercises. Take medicines that affect the amount of salt is in your blood. What are the signs or symptoms? Symptoms of this condition include: Headache. Feeling like you may vomit (nausea). Vomiting. Being very tired (lethargic). Muscle weakness and cramps. Not wanting to eat as much as normal (loss of appetite). Feeling weak or light-headed. Severe symptoms of this condition include: Confusion. Feeling restless (agitation). Having a fast heart rate. Passing out (fainting). Seizures. Coma. How is this treated? Treatment for this condition depends on the cause. Treatment may include: Getting fluids through an IV tube that is put into one of your veins. Taking medicines to fix the salt levels in your blood. If medicines are causing the problem, your medicines will need to be changed. Limiting how much water or fluid you take in. Monitoring in the hospital to watch your symptoms. Follow these instructions at home:  Take over-the-counter and prescription medicines only as told by your doctor. Many medicines can make this condition worse. Talk with your doctor about any medicines that you are taking. Eat  and drink exactly as you are told by your doctor. Eat only the foods you are told to eat. Limit how much fluid you take. Do not drink alcohol. Keep all follow-up visits as told by your doctor. This is important. Contact a doctor if: You feel more like you may vomit. You feel more tired. Your headache gets worse. You feel more confused. You feel weaker. Your symptoms go away and then they come back. You have trouble following the diet instructions. Get help right away if: You have a seizure. You pass out. You keep having watery poop. You keep vomiting. Summary Hyponatremia is when the amount of salt in your blood is too low. When salt levels are low, you can have swelling throughout the body. The swelling mostly affects the brain. Treatment depends on the cause. Treatment may include getting IV fluids, medicines, or not drinking as much fluid. This information is not intended to replace advice given to you by your health care provider. Make sure you discuss any questions you have with your healthcare provider. Document Revised: 12/31/2018 Document Reviewed: 09/17/2018 Elsevier Patient Education  2022 [employer]  

## 2021-05-14 NOTE — Progress Notes (Signed)
Virtual Visit via Video Note  I connected with Jennifer [name] on 05/14/21 at  2:40 PM EDT by a video enabled telemedicine application and verified that I am speaking with the correct person using two identifiers.  Location Provider Location : [facility] Patient Location : Car  Participants: Patient , Provider   I discussed the limitations of evaluation and management by telemedicine and the availability of in person appointments. The patient expressed understanding and agreed to proceed.    I discussed the assessment and treatment plan with the patient. The patient was provided an opportunity to ask questions and all were answered. The patient agreed with the plan and demonstrated an understanding of the instructions.   The patient was advised to call back or seek an in-person evaluation if the symptoms worsen or if the condition fails to improve as anticipated.   [place] MD OP Progress Note  05/14/2021 3:05 PM Jennifer [name]  MRN:  [id number]  Chief Complaint:  Chief Complaint   Follow-up; Anxiety; Depression    HPI: Jennifer [name] is a 65 year old Caucasian female, lives in [place], married, has a history of bipolar disorder, GAD, insomnia, neuroleptic induced dystonia, chronic pain was evaluated by telemedicine today.  Patient today reports overall she is doing okay with regards to her mood.  Denies any significant depression or anxiety.  She however reports she continues to feel tired when she wakes up in the morning.  She reports she sleeps okay during the night and reports doxepin is beneficial.  Patient was advised to repeat her sodium level last visit since she is on medications like Trileptal.  She also was advised to hold the Prozac due to the low sodium level and to restart it only after the sodium level is corrected.  Patient however has not been able to repeat her sodium level yet.  She agrees to get it done today if possible.  She also agrees to get in touch with her  primary care provider for further management as needed.  She denies any suicidality, homicidality or perceptual disturbances.  Patient denies any other concerns today.  Visit Diagnosis:    ICD-10-CM   1. Bipolar disorder, in full remission, most recent episode depressed ([place])  F31.76     2. Generalized anxiety disorder  F41.1     3. Insomnia due to mental condition  F51.05    anxiety    4. Hyponatremia  E87.1 Sodium      Past Psychiatric History: I have reviewed past psychiatric history from progress note on 02/05/2018  Past Medical History:  Past Medical History:  Diagnosis Date   Anemia 1985   bled during a cervical biopsy, led to bleed transfusion    Anginal pain ([place])    Anxiety    Arthritis    pt. reports that her neck is stiff    Bipolar depression ([place])    Blood dyscrasia    reported hx [name] '85; saw hematologist Dr. 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 Gar Ponto [facility]    PCP   CRD (chronic renal disease)    Depression    Depression with anxiety    Diabetes mellitus    Dyspnea  W/ EXERTION    Enlarged liver    Family history of adverse reaction to anesthesia    father woke up slowly   Fibromyalgia    Generalized anxiety disorder    GERD (gastroesophageal reflux disease)    Hyperlipidemia    Hypertension    Hypothyroidism    Low back pain    Low sodium levels    Migraines    Neuropathy    Neuropathy    Persistent disorder of initiating or maintaining sleep    Restless leg    Von Willebrand disease ([place])     Past Surgical History:  Procedure Laterality Date   ABDOMINAL HYSTERECTOMY     ANTERIOR LAT LUMBAR FUSION N/A 12/10/2016   Procedure: LUMBAR FOUR-FIVE  Anterior lateral lumbar interbody fusion with LUMBAR FOUR-FIVE   Posterolateral fusion/Re-operative laminectomy LUMBAR FIVE-SACRUM ONE Bilateral, Laminectomy at LUMBAR FOUR-FIVE , 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  and Left LUMBAR FOUR-FIVE  with Mazor;  Surgeon: Kevan Ny Ditty, MD;  Location: [place];  Service: Neurosurgery;  Laterality: N/A;   APPLICATION OF ROBOTIC ASSISTANCE FOR SPINAL PROCEDURE N/A 12/10/2016   Procedure: APPLICATION OF ROBOTIC ASSISTANCE FOR SPINAL PROCEDURE;  Surgeon: Kevan Ny Ditty, MD;  Location: [place];  Service: Neurosurgery;  Laterality: N/A;   BACK SURGERY  2007   lumbar   BREAST IMPLANT REMOVAL     CARDIAC CATHETERIZATION     CAD,PCI of LAD, Cypher stent 2.5-28mm   CARDIAC CATHETERIZATION     PCI of mid-LAD in stent restenosis with a drug-eluting stent   CARDIAC CATHETERIZATION  06/03/13   [facility];MID LAD 100 % STENOSIS; PRIOR STENT; MID RCA 40 % STENOSIS   COLONOSCOPY WITH PROPOFOL N/A 08/13/2018   Procedure: COLONOSCOPY WITH PROPOFOL;  Surgeon: [name], MD;  Location: [facility] END[facility]COPY;  Service: Gastroenterology;  Laterality: N/A;   CORONARY ARTERY BYPASS GRAFT   08-12-2011   CABG x 3   CORONARY STENT PLACEMENT     ESOPHAGOGASTRODUODENOSCOPY (EGD) WITH PROPOFOL N/A 08/13/2018   Procedure: ESOPHAGOGASTRODUODENOSCOPY (EGD) WITH PROPOFOL;  Surgeon: [name], MD;  Location: [facility] ENDOSCOPY;  Service: Gastroenterology;  Laterality: N/A;   LUMBAR FUSION     L1-S1   LUMBAR PERCUTANEOUS PEDICLE SCREW 1 LEVEL N/A 12/10/2016   Procedure: Extension of pedicle screw fixation to LUMBAR FOUR;  Surgeon: Kevan Ny Ditty, MD;  Location: [place];  Service: Neurosurgery;  Laterality: N/A;   NASAL SINUS SURGERY  1997   tuirbinate reduction    RECTAL EXAM UNDER ANESTHESIA N/A 10/06/2018   Procedure: RECTAL EXAM UNDER ANESTHESIA;  Surgeon: Jules Husbands, MD;  Location: [facility] ORS;  Service: General;  Laterality: N/A;    Family Psychiatric History: Reviewed family psychiatric history from progress note on  02/05/2018  Family History:  Family History  Problem Relation Age of Onset   Aneurysm Father        abdominal   Diabetes Father    Alcohol abuse Father    Hypertension Father    Aneurysm Brother        abdominal   Alcohol abuse Brother    Hypertension Brother    Colon cancer Brother    Diabetes Mother    Alcohol abuse Mother    Hypertension Other        family hx   Hyperlipidemia Other        family hx   Diabetes Other        family hx    Social History: Reviewed social history from  progress note on 02/05/2018 Social History   Socioeconomic History   Marital status: Married    Spouse name: Photographer   Number of children: 3   Years of education: Not on file   Highest education level: Associate degree: occupational, Hotel manager, or vocational program  Occupational History   Not on file  Tobacco Use   Smoking status: Former    Years: 16.00    Types: Cigarettes    Quit date: 06/20/2010    Years since quitting: 10.9   Smokeless tobacco: Never  Vaping Use   Vaping Use: Every day  Substance and Sexual Activity   Alcohol use: No    Alcohol/week: 0.0 standard drinks   Drug use: No   Sexual activity: Yes  Other Topics Concern   Not on file  Social History Narrative   Not on file   Social Determinants of Health   Financial Resource Strain: Not on file  Food Insecurity: Not on file  Transportation Needs: Not on file  Physical Activity: Not on file  Stress: Not on file  Social Connections: Not on file    Allergies:  Allergies  Allergen Reactions   Aspirin Other (See Comments)    Metabolic Disorder Labs: Lab Results  Component Value Date   HGBA1C 7.2 (H) 01/01/2020   MPG 159.94 01/01/2020   MPG 223.08 12/05/2017   Lab Results  Component Value Date   PROLACTIN 6.1 12/05/2017   Lab Results  Component Value Date   CHOL 238 (H) 12/05/2017   TRIG 819 (H) 12/05/2017   HDL 41 12/05/2017   CHOLHDL 5.8 12/05/2017   VLDL UNABLE TO CALCULATE IF TRIGLYCERIDE OVER  400 mg/dL 12/05/2017   LDLCALC UNABLE TO CALCULATE IF TRIGLYCERIDE OVER 400 mg/dL 12/05/2017   LDLCALC SEE COMMENT 07/09/2012   Lab Results  Component Value Date   TSH 2.600 08/03/2018   TSH 86.000 (H) 12/05/2017    Therapeutic Level Labs: No results found for: LITHIUM No results found for: VALPROATE No components found for:  CBMZ  Current Medications: Current Outpatient Medications  Medication Sig Dispense Refill   albuterol-ipratropium (COMBIVENT) 18-103 MCG/ACT inhaler Inhale 2 puffs into the lungs daily as needed for wheezing or shortness of breath.      amLODipine (NORVASC) 10 MG tablet Take 1 tablet (10 mg total) by mouth daily. 90 tablet 3   atorvastatin (LIPITOR) 80 MG tablet Take 1 tablet (80 mg total) by mouth daily. 90 tablet 3   carisoprodol (SOMA) 350 MG tablet Take 1 tablet (350 mg total) by mouth 2 (two) times daily as needed for muscle spasms.     carvedilol (COREG) 12.5 MG tablet Take 1 tablet (12.5 mg total) by mouth 2 (two) times daily with a meal. 180 tablet 3   DETROL LA 4 MG 24 hr capsule Take 4 mg by mouth daily.     doxepin (SINEQUAN) 10 MG capsule Take 1-2 capsules (10-20 mg total) by mouth at bedtime as needed. 60 capsule 1   estradiol (ESTRING) 2 MG vaginal ring Place vaginally.     FLUoxetine (PROZAC) 20 MG capsule Take 1 capsule (20 mg total) by mouth daily with breakfast. 30 capsule 1   fluticasone (FLONASE) 50 MCG/ACT nasal spray Place 1 spray into both nostrils daily as needed.      gabapentin (NEURONTIN) 300 MG capsule Take 300 mg by mouth at bedtime.      HUMULIN 70/30 KWIKPEN (70-30) 100 UNIT/ML PEN Inject 10-15 Units into the skin See admin instructions. Inject 10-15  units SQ in the morning and inject 10-12 units SQ at bedtime per sliding scale     metFORMIN (GLUCOPHAGE-XR) 750 MG 24 hr tablet Take 750 mg by mouth daily with breakfast.      metFORMIN (GLUCOPHAGE-XR) 750 MG 24 hr tablet Take 750 mg by mouth in the morning and at bedtime.      nitroGLYCERIN (NITROSTAT) 0.4 MG SL tablet Place 1 tablet (0.4 mg total) under the tongue every 5 (five) minutes as needed for chest pain. 25 tablet 6   omeprazole (PRILOSEC) 40 MG capsule Take 40 mg by mouth daily.      Oxcarbazepine (TRILEPTAL) 300 MG tablet Take 3 tablets (900 mg total) by mouth at bedtime. Hold until you follow-up with primary care provider and repeat a sodium level.  Once sodium level is back to normal will reevaluate and discuss getting back on Trileptal. 270 tablet 3   Oxycodone HCl 10 MG TABS LIMIT ONE HALF TO ONE TABLET BY MOUTH FOUR TO SIX TIMES PER DAY IF TOLERATED (NO HYDROCODONE ACETAMINOPHEN)     RESTASIS 0.05 % ophthalmic emulsion Place 1 drop into both eyes daily as needed (for dryness).      rOPINIRole (REQUIP) 0.5 MG tablet Take 1 mg by mouth at bedtime.      sitaGLIPtin (JANUVIA) 100 MG tablet Take 100 mg by mouth daily.     SYNTHROID 25 MCG tablet Take 25 mcg by mouth daily before breakfast.      telmisartan (MICARDIS) 80 MG tablet Take 1 tablet (80 mg total) by mouth daily. 90 tablet 3   No current facility-administered medications for this visit.     Musculoskeletal: Strength & Muscle Tone:  UTA Gait & Station: normal Patient leans: N/A  Psychiatric Specialty Exam: Review of Systems  Musculoskeletal:  Positive for back pain.  Psychiatric/Behavioral:  The patient is nervous/anxious.   All other systems reviewed and are negative.  There were no vitals taken for this visit.There is no height or weight on file to calculate BMI.  General Appearance: Casual  Eye Contact:  Good  Speech:  Clear and Coherent  Volume:  Normal  Mood:  Anxious  Affect:  Congruent  Thought Process:  Goal Directed and Descriptions of Associations: Intact  Orientation:  Full (Time, Place, and Person)  Thought Content: Logical   Suicidal Thoughts:  No  Homicidal Thoughts:  No  Memory:  Immediate;   Fair Recent;   Fair Remote;   Fair  Judgement:  Fair  Insight:  Fair   Psychomotor Activity:  Normal  Concentration:  Concentration: Fair and Attention Span: Fair  Recall:  [employer] of Knowledge: Fair  Language: Fair  Akathisia:  No  Handed:  Right  AIMS (if indicated): not done  Assets:  Communication Skills Desire for Improvement Housing Intimacy Social Support  ADL's:  Intact  Cognition: WNL  Sleep:  Fair   Screenings: [place] Office Visit from 12/23/2018 in [place] Office Visit from 08/18/2018 in [place] Total Score 5 6      GAD-7    Flowsheet Row Video Visit from 04/10/2021 in [place]  Total GAD-7 Score 16      PHQ2-9    [place] Video Visit from 04/10/2021 in [facility] Office Visit from 05/09/2016 in [facility] Procedure visit from 03/20/2016 in [place] Office Visit from  03/12/2016 in [facility] Clinical Support from 02/13/2016 in [facility]  PHQ-2 Total Score 2 0 0 0 0  PHQ-9 Total Score 7 -- -- -- --      Flowsheet Row Video Visit from 04/10/2021 in [facility] [place] CATEGORY Low Risk        Assessment and Plan: LAYLONIE MARZEC is a 65 year old Caucasian female who has a history of bipolar disorder, anxiety disorder was evaluated by telemedicine today.  Patient is currently struggling with fatigue and low energy although reports anxiety as improving.  Discussed plan as noted below.  Plan Bipolar disorder in remission Patient was advised to hold the Trileptal since she has hyponatremia.  GAD-improving Hold Trileptal and Prozac.   Insomnia-stable Doxepin 10 to 20 mg p.o. nightly.  Hyponatremia-we will order sodium level.  She will go to [facility] today.  Once  I review the sodium level we will discuss restarting Trileptal and Prozac as needed.  Patient reports pain is currently under control and she continues to follow-up with her pain management.  Follow-up in clinic in 4 to 5 weeks or sooner if needed.  This note was generated in part or whole with voice recognition software. Voice recognition is usually quite accurate but there are transcription errors that can and very often do occur. I apologize for any typographical errors that were not detected and corrected.        Ursula Alert, MD 05/14/2021, 3:05 PM

## 2021-05-18 ENCOUNTER — Telehealth: Payer: Self-pay | Admitting: Psychiatry

## 2021-05-18 DIAGNOSIS — F411 Generalized anxiety disorder: Secondary | ICD-10-CM

## 2021-05-18 MED ORDER — FLUOXETINE HCL 20 MG PO CAPS: 20.0000 mg | ORAL_CAPSULE | Freq: Every day | ORAL | 0 refills | Status: DC

## 2021-05-18 NOTE — Telephone Encounter (Signed)
Returned call to patient to discuss her sodium level-reviewed-back to normal.  Will discontinue Trileptal.  Restart Prozac 20 mg p.o. daily  Patient advised to call the office back on Monday to schedule an appointment in August.

## 2021-09-02 ENCOUNTER — Other Ambulatory Visit: Payer: Self-pay | Admitting: Psychiatry

## 2021-09-02 DIAGNOSIS — F5105 Insomnia due to other mental disorder: Secondary | ICD-10-CM

## 2021-09-02 DIAGNOSIS — F411 Generalized anxiety disorder: Secondary | ICD-10-CM

## 2021-10-16 ENCOUNTER — Encounter: Payer: Self-pay | Admitting: Psychiatry

## 2021-10-16 ENCOUNTER — Telehealth (INDEPENDENT_AMBULATORY_CARE_PROVIDER_SITE_OTHER): Payer: Medicare Other | Admitting: Psychiatry

## 2021-10-16 ENCOUNTER — Other Ambulatory Visit: Payer: Self-pay

## 2021-10-16 DIAGNOSIS — F3176 Bipolar disorder, in full remission, most recent episode depressed: Secondary | ICD-10-CM | POA: Diagnosis not present

## 2021-10-16 DIAGNOSIS — F411 Generalized anxiety disorder: Secondary | ICD-10-CM

## 2021-10-16 DIAGNOSIS — F5105 Insomnia due to other mental disorder: Secondary | ICD-10-CM | POA: Diagnosis not present

## 2021-10-16 MED ORDER — FLUOXETINE HCL 20 MG PO CAPS
20.0000 mg | ORAL_CAPSULE | Freq: Every day | ORAL | 0 refills | Status: DC
Start: 2021-10-16 — End: 2021-12-20

## 2021-10-16 MED ORDER — DOXEPIN HCL 10 MG PO CAPS: ORAL_CAPSULE | ORAL | 0 refills | Status: DC

## 2021-10-16 MED ORDER — GABAPENTIN 100 MG PO CAPS
100.0000 mg | ORAL_CAPSULE | Freq: Every day | ORAL | 0 refills | Status: DC
Start: 2021-10-16 — End: 2021-11-15

## 2021-10-16 NOTE — Progress Notes (Signed)
Virtual Visit via Telephone Note  I connected with Jennifer [name] on 10/16/21 at  3:30 PM EST by telephone and verified that I am speaking with the correct person using two identifiers. Location Provider Location : [facility] Patient Location : Home  Participants: Patient , Provider    I discussed the limitations, risks, security and privacy concerns of performing an evaluation and management service by telephone and the availability of in person appointments. I also discussed with the patient that there may be a patient responsible charge related to this service. The patient expressed understanding and agreed to proceed.  I discussed the assessment and treatment plan with the patient. The patient was provided an opportunity to ask questions and all were answered. The patient agreed with the plan and demonstrated an understanding of the instructions.   The patient was advised to call back or seek an in-person evaluation if the symptoms worsen or if the condition fails to improve as anticipated.   [place] [facility] MD OP Progress Note  10/17/2021 11:52 AM Jennifer [name]  MRN:  [id number]  Chief Complaint:  Chief Complaint   Follow-up; Anxiety; Depression    HPI: Jennifer [name] is a 65 year old Caucasian female, lives in [place], married, has a history of bipolar disorder, GAD, insomnia, neuroleptic induced dystonia, chronic pain was evaluated by telemedicine today.  Patient today reports she got busy with her life since she has been trying to support her grandchildren as well as her son who needs her help.  She also reports she and her family got sick with COVID-19 as well as flu after that.  She hence could not keep her appointment with Probation officer.  Patient reports she does struggle with feeling overwhelmed, anxious a lot because of everything that she has to do at home.  She also reports sleep problems.  She reports when she tries to sleep her husband's snoring as well as the kids noise around the  house keeps her awake.  She does have gabapentin available which was prescribed to her for her pain however she has not been able to take it since the 300 mg makes her feel dizzy.  Patient has been compliant on the Prozac and the doxepin.  Denies side effects.  Patient denies any suicidality, homicidality or perceptual disturbances.  Patient denies any other concerns today.  Visit Diagnosis:    ICD-10-CM   1. Bipolar disorder, in full remission, most recent episode depressed ([place])  F31.76     2. Generalized anxiety disorder  F41.1 gabapentin (NEURONTIN) 100 MG capsule    doxepin (SINEQUAN) 10 MG capsule    FLUoxetine (PROZAC) 20 MG capsule    3. Insomnia due to mental condition  F51.05 doxepin (SINEQUAN) 10 MG capsule   Anxiety      Past Psychiatric History: I have reviewed past psychiatric history from progress note on 02/05/2018  Past Medical History:  Past Medical History:  Diagnosis Date   Anemia 1985   bled during a cervical biopsy, led to bleed transfusion    Anginal pain ([place])    Anxiety    Arthritis    pt. reports that her neck is stiff    Bipolar depression ([place])    Blood dyscrasia    reported hx [facility] '85; saw hematologist Dr. 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 Gar Ponto [facility]    PCP   CRD (chronic renal disease)    Depression    Depression with anxiety    Diabetes mellitus    Dyspnea    W/ EXERTION    Enlarged liver    Family history of adverse reaction to anesthesia    father woke up slowly   Fibromyalgia    Generalized anxiety disorder    GERD (gastroesophageal reflux disease)    Hyperlipidemia    Hypertension    Hypothyroidism    Low back pain    Low sodium levels    Migraines    Neuropathy    Neuropathy    Persistent disorder  of initiating or maintaining sleep    Restless leg    Von Willebrand disease     Past Surgical History:  Procedure Laterality Date   ABDOMINAL HYSTERECTOMY     ANTERIOR LAT LUMBAR FUSION N/A 12/10/2016   Procedure: LUMBAR FOUR-FIVE  Anterior lateral lumbar interbody fusion with LUMBAR FOUR-FIVE  Posterolateral fusion/Re-operative laminectomy LUMBAR FIVE-SACRUM ONE Bilateral, Laminectomy at LUMBAR FOUR-FIVE , 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  and Left LUMBAR FOUR-FIVE  with Mazor;  Surgeon: Kevan Ny Ditty, MD;  Location: [facility];  Service: Neurosurgery;  Laterality: N/A;   APPLICATION OF ROBOTIC ASSISTANCE FOR SPINAL PROCEDURE N/A 12/10/2016   Procedure: APPLICATION OF ROBOTIC ASSISTANCE FOR SPINAL PROCEDURE;  Surgeon: Kevan Ny Ditty, MD;  Location: [facility];  Service: Neurosurgery;  Laterality: N/A;   BACK SURGERY  2007   lumbar   BREAST IMPLANT REMOVAL     CARDIAC CATHETERIZATION     CAD,PCI of LAD, Cypher stent 2.5-28mm   CARDIAC CATHETERIZATION     PCI of mid-LAD in stent restenosis with a drug-eluting stent   CARDIAC CATHETERIZATION  06/03/13   [facility];MID LAD 100 % STENOSIS; PRIOR STENT; MID RCA 40 % STENOSIS   COLONOSCOPY WITH PROPOFOL N/A 08/13/2018   Procedure: COLONOSCOPY WITH PROPOFOL;  Surgeon: [name], MD;  Location: [facility] ENDOSCOPY;  Service: Gastroenterology;  Laterality: N/A;   CORONARY ARTERY BYPASS GRAFT   08-12-2011   CABG x 3   CORONARY STENT PLACEMENT     ESOPHAGOGASTRODUODENOSCOPY (EGD) WITH PROPOFOL N/A 08/13/2018   Procedure: ESOPHAGOGASTRODUODENOSCOPY (EGD) WITH PROPOFOL;  Surgeon: [name], MD;  Location: [facility] ENDOSCOPY;  Service: Gastroenterology;  Laterality: N/A;   LUMBAR FUSION     L1-S1   LUMBAR PERCUTANEOUS PEDICLE SCREW 1 LEVEL N/A 12/10/2016   Procedure: Extension of pedicle screw fixation to LUMBAR FOUR;  Surgeon: Kevan Ny Ditty, MD;  Location: [facility];  Service: Neurosurgery;  Laterality: N/A;   NASAL SINUS SURGERY   1997   tuirbinate reduction    RECTAL EXAM UNDER ANESTHESIA N/A 10/06/2018   Procedure: RECTAL EXAM UNDER ANESTHESIA;  Surgeon: Jules Husbands, MD;  Location: [facility] ORS;  Service: General;  Laterality: N/A;    Family Psychiatric History: Reviewed family psychiatric history from progress note on 02/05/2018  Family History:  Family History  Problem Relation Age of Onset   Aneurysm Father        abdominal   Diabetes Father    Alcohol abuse Father    Hypertension Father    Aneurysm Brother        abdominal   Alcohol abuse Brother    Hypertension Brother    Colon cancer Brother    Diabetes Mother    Alcohol  abuse Mother    Hypertension Other        family hx   Hyperlipidemia Other        family hx   Diabetes Other        family hx    Social History: Reviewed social history from progress note on 02/05/2018 Social History   Socioeconomic History   Marital status: Married    Spouse name: Photographer   Number of children: 3   Years of education: Not on file   Highest education level: Associate degree: occupational, Hotel manager, or vocational program  Occupational History   Not on file  Tobacco Use   Smoking status: Former    Years: 16.00    Types: Cigarettes    Quit date: 06/20/2010    Years since quitting: 11.3   Smokeless tobacco: Never  Vaping Use   Vaping Use: Every day  Substance and Sexual Activity   Alcohol use: No    Alcohol/week: 0.0 standard drinks   Drug use: No   Sexual activity: Yes  Other Topics Concern   Not on file  Social History Narrative   Not on file   Social Determinants of Health   Financial Resource Strain: Not on file  Food Insecurity: Not on file  Transportation Needs: Not on file  Physical Activity: Not on file  Stress: Not on file  Social Connections: Not on file    Allergies:  Allergies  Allergen Reactions   Aspirin Other (See Comments)    Metabolic Disorder Labs: Lab Results  Component Value Date   HGBA1C 7.2 (H) 01/01/2020    MPG 159.94 01/01/2020   MPG 223.08 12/05/2017   Lab Results  Component Value Date   PROLACTIN 6.1 12/05/2017   Lab Results  Component Value Date   CHOL 238 (H) 12/05/2017   TRIG 819 (H) 12/05/2017   HDL 41 12/05/2017   CHOLHDL 5.8 12/05/2017   VLDL UNABLE TO CALCULATE IF TRIGLYCERIDE OVER 400 mg/dL 12/05/2017   LDLCALC UNABLE TO CALCULATE IF TRIGLYCERIDE OVER 400 mg/dL 12/05/2017   LDLCALC SEE COMMENT 07/09/2012   Lab Results  Component Value Date   TSH 2.600 08/03/2018   TSH 86.000 (H) 12/05/2017    Therapeutic Level Labs: No results found for: LITHIUM No results found for: VALPROATE No components found for:  CBMZ  Current Medications: Current Outpatient Medications  Medication Sig Dispense Refill   albuterol-ipratropium (COMBIVENT) 18-103 MCG/ACT inhaler Inhale 2 puffs into the lungs daily as needed for wheezing or shortness of breath.      amLODipine (NORVASC) 10 MG tablet Take 1 tablet (10 mg total) by mouth daily. 90 tablet 3   atorvastatin (LIPITOR) 80 MG tablet Take 1 tablet (80 mg total) by mouth daily. 90 tablet 3   carisoprodol (SOMA) 350 MG tablet Take 1 tablet (350 mg total) by mouth 2 (two) times daily as needed for muscle spasms.     carvedilol (COREG) 12.5 MG tablet Take 1 tablet (12.5 mg total) by mouth 2 (two) times daily with a meal. 180 tablet 3   diclofenac Sodium (VOLTAREN) 1 % GEL SMARTSIG:2-4 Gram(s) Topical 4 Times Daily     estradiol (ESTRING) 2 MG vaginal ring Place vaginally.     ezetimibe (ZETIA) 10 MG tablet Take 10 mg by mouth daily.     fluticasone (FLONASE) 50 MCG/ACT nasal spray Place 1 spray into both nostrils daily as needed.      gabapentin (NEURONTIN) 100 MG capsule Take 1 capsule (100 mg total) by mouth  at bedtime. FOR MOOD 90 capsule 0   glucose blood (FREESTYLE LITE) test strip 3 (three) times daily     HUMULIN 70/30 KWIKPEN (70-30) 100 UNIT/ML PEN Inject 10-15 Units into the skin See admin instructions. Inject 10-15 units SQ in the  morning and inject 10-12 units SQ at bedtime per sliding scale     levothyroxine (SYNTHROID) 25 MCG tablet Take by mouth.     metFORMIN (GLUCOPHAGE-XR) 750 MG 24 hr tablet Take 750 mg by mouth in the morning and at bedtime.     naloxone (NARCAN) nasal spray 4 mg/0.1 mL SMARTSIG:Both Nares     nitroGLYCERIN (NITROSTAT) 0.4 MG SL tablet Place 1 tablet (0.4 mg total) under the tongue every 5 (five) minutes as needed for chest pain. 25 tablet 6   omeprazole (PRILOSEC) 40 MG capsule Take 40 mg by mouth daily.      Oxycodone HCl 10 MG TABS LIMIT ONE HALF TO ONE TABLET BY MOUTH FOUR TO SIX TIMES PER DAY IF TOLERATED (NO HYDROCODONE ACETAMINOPHEN)     rOPINIRole (REQUIP) 0.5 MG tablet Take 1 mg by mouth at bedtime.      sitaGLIPtin (JANUVIA) 100 MG tablet Take 100 mg by mouth daily.     telmisartan (MICARDIS) 80 MG tablet Take 1 tablet (80 mg total) by mouth daily. 90 tablet 3   DETROL LA 4 MG 24 hr capsule Take 4 mg by mouth daily. (Patient not taking: Reported on 10/16/2021)     doxepin (SINEQUAN) 10 MG capsule TAKE 1 TO 2 CAPSULES AT BEDTIME AS NEEDED (DOSE REDUCTION) 180 capsule 0   FLUoxetine (PROZAC) 20 MG capsule Take 1 capsule (20 mg total) by mouth daily. 90 capsule 0   metFORMIN (GLUCOPHAGE-XR) 750 MG 24 hr tablet Take 750 mg by mouth daily with breakfast.  (Patient not taking: Reported on 10/16/2021)     RESTASIS 0.05 % ophthalmic emulsion Place 1 drop into both eyes daily as needed (for dryness).  (Patient not taking: Reported on 10/16/2021)     No current facility-administered medications for this visit.     Musculoskeletal: Strength & Muscle Tone:  UTA Gait & Station:  UTA Patient leans: N/A  Psychiatric Specialty Exam: Review of Systems  Psychiatric/Behavioral:  Positive for sleep disturbance. The patient is nervous/anxious.   All other systems reviewed and are negative.  There were no vitals taken for this visit.There is no height or weight on file to calculate BMI.  General  Appearance: UTA  Eye Contact:  UTA  Speech:  Clear and Coherent  Volume:  Normal  Mood:  Anxious  Affect:   UTA  Thought Process:  Goal Directed and Descriptions of Associations: Intact  Orientation:  Full (Time, Place, and Person)  Thought Content: Logical   Suicidal Thoughts:  No  Homicidal Thoughts:  No  Memory:  Immediate;   Fair Recent;   Fair Remote;   Fair  Judgement:  Fair  Insight:  Fair  Psychomotor Activity:   UTA  Concentration:  Concentration: Fair and Attention Span: Fair  Recall:  [employer] of Knowledge: Fair  Language: Fair  Akathisia:  No  Handed:  Right  AIMS (if indicated): not done  Assets:  Communication Skills Desire for Improvement Housing Social Support  ADL's:  Intact  Cognition: WNL  Sleep:  Poor   Screenings: [facility] Office Visit from 12/23/2018 in [facility] [place] Office Visit from 08/18/2018 in [facility] Total Score  5 6      GAD-7    Flowsheet Row Video Visit from 04/10/2021 in [facility]  Total GAD-7 Score 16      PHQ2-9    Flowsheet Row Video Visit from 04/10/2021 in [facility] Office Visit from 05/09/2016 in [facility] Procedure visit from 03/20/2016 in [facility] Office Visit from 03/12/2016 in [facility] Clinical Support from 02/13/2016 in [facility]  PHQ-2 Total Score 2 0 0 0 0  PHQ-9 Total Score 7 -- -- -- --      Flowsheet Row Video Visit from 04/10/2021 in [facility] Low Risk        Assessment and Plan: Jennifer [name] is a 64 year old Caucasian female who has a history of bipolar disorder, anxiety disorder was evaluated by telemedicine today.  Patient is  currently struggling with anxiety, sleep problems, will benefit from the following plan.  Plan Bipolar disorder in remission We will continue to monitor closely.  GAD-unstable Continue Prozac 20 mg p.o. daily Start gabapentin at a lower dose 100 mg p.o. nightly.  Insomnia-unstable Patient to work on sleep hygiene techniques. Continue doxepin 10 to 20 mg manage nightly Start gabapentin 100 mg p.o. nightly-lower dosage.   Patient advised to follow up with a therapist however she declines.   I have spent at least 20 minutes non face to face with patient today.    Follow-up in clinic in 1 month or sooner in person.  This note was generated in part or whole with voice recognition software. Voice recognition is usually quite accurate but there are transcription errors that can and very often do occur. I apologize for any typographical errors that were not detected and corrected.       Ursula Alert, MD 10/17/2021, 11:52 AM

## 2021-11-15 ENCOUNTER — Other Ambulatory Visit: Payer: Self-pay

## 2021-11-15 ENCOUNTER — Ambulatory Visit (INDEPENDENT_AMBULATORY_CARE_PROVIDER_SITE_OTHER): Payer: Medicare Other | Admitting: Psychiatry

## 2021-11-15 ENCOUNTER — Encounter: Payer: Self-pay | Admitting: Psychiatry

## 2021-11-15 VITALS — BP 134/74 | HR 90 | Temp 97.9°F | Wt 149.6 lb

## 2021-11-15 DIAGNOSIS — F5105 Insomnia due to other mental disorder: Secondary | ICD-10-CM

## 2021-11-15 DIAGNOSIS — F3176 Bipolar disorder, in full remission, most recent episode depressed: Secondary | ICD-10-CM | POA: Diagnosis not present

## 2021-11-15 DIAGNOSIS — F411 Generalized anxiety disorder: Secondary | ICD-10-CM

## 2021-11-15 MED ORDER — GABAPENTIN 400 MG PO CAPS: 400.0000 mg | ORAL_CAPSULE | Freq: Every day | ORAL | 1 refills | Status: DC

## 2021-11-15 NOTE — Progress Notes (Signed)
[facility] MD OP Progress Note  11/15/2021 2:02 PM Jennifer [name]  MRN:  [id number]  Chief Complaint:  Chief Complaint   Follow-up, 66 year old Caucasian female, with history of bipolar disorder, generalized anxiety disorder, insomnia, chronic pain, presents with worsening sleep problems and for medication management of her mood symptoms.    HPI: Jennifer [name] is a 66 year old Caucasian female, lives in [place], married, has a history of bipolar disorder, GAD, insomnia, neuroleptic induced dystonia, chronic pain was evaluated in office today.  Patient reports she had a good holiday season.  However home life has been stressful due to a lot of relationship struggles going on between her husband and her son.  Patient reports they are currently taking care of of their grandchildren and that also has been stressful.  She reports she is trying to do the best that she can.  Patient reports anxiety is overall under control on the current medication regimen.  She is compliant on the Prozac and the gabapentin.  She continues to struggle with sleep.  She has difficulty falling asleep as well as maintaining sleep.  She is on doxepin and gabapentin at night.  The addition of gabapentin has helped to some extent however she continues to struggle.  Agreeable to dosage increase.  Patient continues to struggle with pain, currently under the care of pain management.  Denies any suicidality, homicidality or perceptual disturbances.  Denies any other concerns today.  Visit Diagnosis:    ICD-10-CM   1. Bipolar disorder, in full remission, most recent episode depressed (HCC)  F31.76 gabapentin (NEURONTIN) 400 MG capsule    2. Generalized anxiety disorder  F41.1 gabapentin (NEURONTIN) 400 MG capsule    3. Insomnia due to mental condition  F51.05    Anxiety      Past Psychiatric History: Reviewed past psychiatric history from progress note from 02/05/2018.  Past Medical History:  Past Medical History:   Diagnosis Date   Anemia 1985   bled during a cervical biopsy, led to bleed transfusion    Anginal pain ([place])    Anxiety    Arthritis    pt. reports that her neck is stiff    Bipolar depression ([place])    Blood dyscrasia    reported hx [name] '85; saw hematologist Dr. 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 Gar Ponto [facility]    PCP   CRD (chronic renal disease)    Depression    Depression with anxiety    Diabetes mellitus    Dyspnea    W/ EXERTION    Enlarged liver    Family history of adverse reaction to anesthesia    father woke up slowly   Fibromyalgia    Generalized anxiety disorder    GERD (gastroesophageal reflux disease)    Hyperlipidemia    Hypertension    Hypothyroidism    Low back pain    Low sodium levels    Migraines    Neuropathy    Neuropathy    Persistent disorder of initiating or maintaining sleep    Restless leg    Von Willebrand disease     Past Surgical History:  Procedure Laterality Date   ABDOMINAL HYSTERECTOMY     ANTERIOR  LAT LUMBAR FUSION N/A 12/10/2016   Procedure: LUMBAR FOUR-FIVE  Anterior lateral lumbar interbody fusion with LUMBAR FOUR-FIVE  Posterolateral fusion/Re-operative laminectomy LUMBAR FIVE-SACRUM ONE Bilateral, Laminectomy at LUMBAR FOUR-FIVE , 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  and Left LUMBAR FOUR-FIVE  with Mazor;  Surgeon: Kevan Ny Ditty, MD;  Location: [facility];  Service: Neurosurgery;  Laterality: N/A;   APPLICATION OF ROBOTIC ASSISTANCE FOR SPINAL PROCEDURE N/A 12/10/2016   Procedure: APPLICATION OF ROBOTIC ASSISTANCE FOR SPINAL PROCEDURE;  Surgeon: Kevan Ny Ditty, MD;  Location: [facility];  Service: Neurosurgery;  Laterality: N/A;   BACK SURGERY  2007   lumbar   BREAST IMPLANT REMOVAL      CARDIAC CATHETERIZATION     CAD,PCI of LAD, Cypher stent 2.5-28mm   CARDIAC CATHETERIZATION     PCI of mid-LAD in stent restenosis with a drug-eluting stent   CARDIAC CATHETERIZATION  06/03/13   [facility];MID LAD 100 % STENOSIS; PRIOR STENT; MID RCA 40 % STENOSIS   COLONOSCOPY WITH PROPOFOL N/A 08/13/2018   Procedure: COLONOSCOPY WITH PROPOFOL;  Surgeon: [name], MD;  Location: [facility] ENDOSCOPY;  Service: Gastroenterology;  Laterality: N/A;   CORONARY ARTERY BYPASS GRAFT   08-12-2011   CABG x 3   CORONARY STENT PLACEMENT     ESOPHAGOGASTRODUODENOSCOPY (EGD) WITH PROPOFOL N/A 08/13/2018   Procedure: ESOPHAGOGASTRODUODENOSCOPY (EGD) WITH PROPOFOL;  Surgeon: [name], MD;  Location: [facility] ENDOSCOPY;  Service: Gastroenterology;  Laterality: N/A;   LUMBAR FUSION     L1-S1   LUMBAR PERCUTANEOUS PEDICLE SCREW 1 LEVEL N/A 12/10/2016   Procedure: Extension of pedicle screw fixation to LUMBAR FOUR;  Surgeon: Kevan Ny Ditty, MD;  Location: [facility];  Service: Neurosurgery;  Laterality: N/A;   NASAL SINUS SURGERY  1997   tuirbinate reduction    RECTAL EXAM UNDER ANESTHESIA N/A 10/06/2018   Procedure: RECTAL EXAM UNDER ANESTHESIA;  Surgeon: Jules Husbands, MD;  Location: [facility] ORS;  Service: General;  Laterality: N/A;    Family Psychiatric History: Reviewed family psychiatric history from progress note on 02/05/2018.  Family History:  Family History  Problem Relation Age of Onset   Aneurysm Father        abdominal   Diabetes Father    Alcohol abuse Father    Hypertension Father    Aneurysm Brother        abdominal   Alcohol abuse Brother    Hypertension Brother    Colon cancer Brother    Diabetes Mother    Alcohol abuse Mother    Hypertension Other        family hx   Hyperlipidemia Other        family hx   Diabetes Other        family hx    Social History: Reviewed social history from progress note on 02/05/2018. Social History   Socioeconomic History   Marital status: Married     Spouse name: Photographer   Number of children: 3   Years of education: Not on file   Highest education level: Associate degree: occupational, Hotel manager, or vocational program  Occupational History   Not on file  Tobacco Use   Smoking status: Former    Years: 16.00    Types: Cigarettes    Quit date: 06/20/2010    Years since quitting: 11.4   Smokeless tobacco: Never  Vaping Use   Vaping Use: Every day  Substance and Sexual Activity   Alcohol use: No    Alcohol/week: 0.0 standard drinks  Drug use: No   Sexual activity: Yes  Other Topics Concern   Not on file  Social History Narrative   Not on file   Social Determinants of Health   Financial Resource Strain: Not on file  Food Insecurity: Not on file  Transportation Needs: Not on file  Physical Activity: Not on file  Stress: Not on file  Social Connections: Not on file    Allergies:  Allergies  Allergen Reactions   Aspirin Other (See Comments)    Metabolic Disorder Labs: Lab Results  Component Value Date   HGBA1C 7.2 (H) 01/01/2020   MPG 159.94 01/01/2020   MPG 223.08 12/05/2017   Lab Results  Component Value Date   PROLACTIN 6.1 12/05/2017   Lab Results  Component Value Date   CHOL 238 (H) 12/05/2017   TRIG 819 (H) 12/05/2017   HDL 41 12/05/2017   CHOLHDL 5.8 12/05/2017   VLDL UNABLE TO CALCULATE IF TRIGLYCERIDE OVER 400 mg/dL 12/05/2017   LDLCALC UNABLE TO CALCULATE IF TRIGLYCERIDE OVER 400 mg/dL 12/05/2017   LDLCALC SEE COMMENT 07/09/2012   Lab Results  Component Value Date   TSH 2.600 08/03/2018   TSH 86.000 (H) 12/05/2017    Therapeutic Level Labs: No results found for: LITHIUM No results found for: VALPROATE No components found for:  CBMZ  Current Medications: Current Outpatient Medications  Medication Sig Dispense Refill   albuterol-ipratropium (COMBIVENT) 18-103 MCG/ACT inhaler Inhale 2 puffs into the lungs daily as needed for wheezing or shortness of breath.      amLODipine (NORVASC) 10  MG tablet Take 1 tablet (10 mg total) by mouth daily. 90 tablet 3   atorvastatin (LIPITOR) 80 MG tablet Take 1 tablet (80 mg total) by mouth daily. 90 tablet 3   carisoprodol (SOMA) 350 MG tablet Take 1 tablet (350 mg total) by mouth 2 (two) times daily as needed for muscle spasms.     carvedilol (COREG) 12.5 MG tablet Take 1 tablet (12.5 mg total) by mouth 2 (two) times daily with a meal. 180 tablet 3   DETROL LA 4 MG 24 hr capsule Take 4 mg by mouth daily.     diclofenac Sodium (VOLTAREN) 1 % GEL SMARTSIG:2-4 Gram(s) Topical 4 Times Daily     doxepin (SINEQUAN) 10 MG capsule TAKE 1 TO 2 CAPSULES AT BEDTIME AS NEEDED (DOSE REDUCTION) 180 capsule 0   estradiol (ESTRING) 2 MG vaginal ring Place vaginally.     ezetimibe (ZETIA) 10 MG tablet Take 10 mg by mouth daily.     FLUoxetine (PROZAC) 20 MG capsule Take 1 capsule (20 mg total) by mouth daily. 90 capsule 0   fluticasone (FLONASE) 50 MCG/ACT nasal spray Place 1 spray into both nostrils daily as needed.      gabapentin (NEURONTIN) 400 MG capsule Take 1 capsule (400 mg total) by mouth at bedtime. 90 capsule 1   glucose blood (FREESTYLE LITE) test strip 3 (three) times daily     HUMULIN 70/30 KWIKPEN (70-30) 100 UNIT/ML PEN Inject 10-15 Units into the skin See admin instructions. Inject 10-15 units SQ in the morning and inject 10-12 units SQ at bedtime per sliding scale     levothyroxine (SYNTHROID) 25 MCG tablet Take by mouth.     metFORMIN (GLUCOPHAGE-XR) 750 MG 24 hr tablet Take 750 mg by mouth daily with breakfast.     metFORMIN (GLUCOPHAGE-XR) 750 MG 24 hr tablet Take 750 mg by mouth in the morning and at bedtime.     naloxone [facility])  nasal spray 4 mg/0.1 mL SMARTSIG:Both Nares     nitroGLYCERIN (NITROSTAT) 0.4 MG SL tablet Place 1 tablet (0.4 mg total) under the tongue every 5 (five) minutes as needed for chest pain. 25 tablet 6   omeprazole (PRILOSEC) 40 MG capsule Take 40 mg by mouth daily.      Oxycodone HCl 10 MG TABS LIMIT ONE HALF TO  ONE TABLET BY MOUTH FOUR TO SIX TIMES PER DAY IF TOLERATED (NO HYDROCODONE ACETAMINOPHEN)     RESTASIS 0.05 % ophthalmic emulsion Place 1 drop into both eyes daily as needed (for dryness).     rOPINIRole (REQUIP) 0.5 MG tablet Take 1 mg by mouth at bedtime.      telmisartan (MICARDIS) 80 MG tablet Take 1 tablet (80 mg total) by mouth daily. 90 tablet 3   sitaGLIPtin (JANUVIA) 100 MG tablet Take 100 mg by mouth daily.     No current facility-administered medications for this visit.     Musculoskeletal: Strength & Muscle Tone: within normal limits Gait & Station: normal Patient leans: N/A  Psychiatric Specialty Exam: Review of Systems  Musculoskeletal:  Positive for arthralgias and back pain.  Psychiatric/Behavioral:  Positive for sleep disturbance. The patient is nervous/anxious.   All other systems reviewed and are negative.  Blood pressure 134/74, pulse 90, temperature 97.9 F (36.6 C), temperature source Temporal, weight 149 lb 9.6 oz (67.9 kg).Body mass index is 26.5 kg/m.  General Appearance: Casual  Eye Contact:  Good  Speech:  Clear and Coherent  Volume:  Normal  Mood:  Anxious  Affect:  Congruent  Thought Process:  Goal Directed and Descriptions of Associations: Intact  Orientation:  Full (Time, Place, and Person)  Thought Content: Logical   Suicidal Thoughts:  No  Homicidal Thoughts:  No  Memory:  Immediate;   Fair Recent;   Fair Remote;   Fair  Judgement:  Fair  Insight:  Fair  Psychomotor Activity:  Normal  Concentration:  Concentration: Fair and Attention Span: Fair  Recall:  [employer] of Knowledge: Fair  Language: Fair  Akathisia:  No  Handed:  Right  AIMS (if indicated): done, 0  Assets:  Communication Skills Desire for Improvement Housing Intimacy Social Support  ADL's:  Intact  Cognition: WNL  Sleep:  Poor   Screenings: [place] Office Visit from 12/23/2018 in [place] Office Visit from 08/18/2018  in [place] Total Score 5 [place] Office Visit from 11/15/2021 in [place] Video Visit from 04/10/2021 in [place]  Total GAD-7 Score 4 16      PHQ2-9    [place] Visit from 11/15/2021 in [place] Video Visit from 04/10/2021 in [place] Office Visit from 05/09/2016 in [place] Procedure visit from 03/20/2016 in [place] Office Visit from 03/12/2016 in [facility] [place]  PHQ-2 Total Score 0 2 0 0 0  PHQ-9 Total Score -- 7 -- -- --      Flowsheet Row Video Visit from 04/10/2021 in [place]        Assessment and Plan: ALONNI HEIMSOTH is a 66 year old Caucasian female who has a history of bipolar disorder, anxiety disorder was evaluated in office today.  Patient continues  to struggle with sleep.  Plan as noted below.  Plan Bipolar disorder in remission Continue gabapentin which is a mood stabilizer.  GAD-improving Prozac 20 mg p.o. daily   Insomnia-unstable Continue doxepin 10 to 20 mg p.o. nightly. Increase gabapentin to 400 mg p.o. nightly. We will consider changing gabapentin to Belsomra, however she needs approval from her pain management.  Patient to reach out to [place] if she has any concerns.  Follow-up in clinic in 2 - 4 weeks or sooner if needed.  This note was generated in part or whole with voice recognition software. Voice recognition is usually quite accurate but there are transcription errors that can and very often do occur. I apologize for any typographical errors that were not detected and corrected.     Ursula Alert, MD 11/16/2021, 1:42 PM

## 2021-11-15 NOTE — Patient Instructions (Signed)
Suvorexant Tablets °What is this medication? °SUVOREXANT (SOO voe REX ant) treats insomnia. It helps you go to sleep faster and stay asleep through the night. °This medicine may be used for other purposes; ask your health care provider or pharmacist if you have questions. °COMMON BRAND NAME(S): Belsomra °What should I tell my care team before I take this medication? °They need to know if you have any of these conditions: °Depression °History of substance abuse or addiction °History of a sudden onset of muscle weakness (cataplexy) °History of falling asleep often at unexpected times (narcolepsy) °If you often drink alcohol °Liver disease °Lung or breathing disease °Sleep apnea °Sleep-walking, driving, eating or other activity while not fully awake after taking a sleep medication °Suicidal thoughts, plans, or attempt; a previous suicide attempt by you or a family member °An unusual or allergic reaction to suvorexant, other medications, foods, dyes, or preservatives °Pregnant or trying to get pregnant °Breast-feeding °How should I use this medication? °Take this medication by mouth with water 30 minutes before going to bed. Follow the directions on the prescription label. It is better to take this medication on an empty stomach. Do not take your medication more often than directed. °A special MedGuide will be given to you by the pharmacist with each prescription and refill. Be sure to read this information carefully each time. °Talk to your care team about the use of this medication in children. Special care may be needed. °Overdosage: If you think you have taken too much of this medicine contact a poison control center or emergency room at once. °NOTE: This medicine is only for you. Do not share this medicine with others. °What if I miss a dose? °This does not apply. This medication should only be taken as directed before going to sleep. Do not take double or extra doses. °What may interact with this  medication? °Alcohol °Antihistamines for allergy, cough, or cold °Certain antibiotics, such as erythromycin or clarithromycin °Certain antivirals for HIV or hepatitis °Certain medications for fungal infections, such as itraconazole, ketoconazole, posaconazole °Certain medications for mental health conditions °Certain medications for seizures, such as carbamazepine, phenytoin, phenobarbital °Conivaptan °Diltiazem °General anesthetics, such as halothane, isoflurane, methoxyflurane, propofol °Grapefruit juice °Medications that relax muscles for surgery °Opioid medications for pain °Other medications for sleep °Rifampin °St. John's Wort °Verapamil °This list may not describe all possible interactions. Give your health care provider a list of all the medicines, herbs, non-prescription drugs, or dietary supplements you use. Also tell them if you smoke, drink alcohol, or use illegal drugs. Some items may interact with your medicine. °What should I watch for while using this medication? °Visit your care team for regular checks on your progress. Keep a regular sleep schedule by going to bed at about the same time each night. Avoid caffeine-containing drinks in the evening hours. Talk to your care team if your insomnia worsens or is not better within 7 to 10 days. °After taking this medication, you may get up out of bed and do an activity that you do not know you are doing. The next morning, you may have no memory of this. Activities include driving a car ("sleep-driving"), making and eating food, talking on the phone, sexual activity, and sleep-walking. Serious injuries have occurred. Stop the medication and call your care team right away if you find out you have done any of these activities. Do not take this medication if you have used alcohol that evening. Do not take it if you have taken another   medication for sleep. The risk of doing these sleep-related activities is higher. °Do not take this medication unless you are  able to stay in bed for a full night (7 to 8 hours) before you must be active again. Tell your care team if you will need to perform activities requiring full alertness, such as driving, the next day. You may have a decrease in mental alertness the day after use, even if you feel that you are fully awake. Do not stand or sit up quickly after taking this medication, especially if you are an older patient. This reduces the risk of dizzy or fainting spells. °If you or your family notice any changes in your moods or behavior, such as new or worsening depression, thoughts of harming yourself, anxiety, other unusual or disturbing thoughts, or memory loss, call your care team right away. °After you stop taking this medication, you may have trouble falling asleep. This is called rebound insomnia. This problem usually goes away on its own after 1 or 2 nights. °What side effects may I notice from receiving this medication? °Side effects that you should report to your care team as soon as possible: °Allergic reactions--skin rash, itching, hives, swelling of the face, lips, tongue, or throat °CNS depression--slow or shallow breathing, shortness of breath, feeling faint, dizziness, confusion, trouble staying awake °Mood and behavior changes--anxiety, nervousness, confusion, hallucinations, irritability, hostility, thoughts of suicide or self-harm, worsening mood, feelings of depression °Sudden and temporary muscle weakness °Unable to move or speak for several minutes upon waking or going to sleep °Unusual sleep behaviors or activities you do not remember, such as driving, eating, or sexual activity °Side effects that usually do not require medical attention (report these to your care team if they continue or are bothersome): °Drowsiness the day after use °Vivid dreams or nightmares °This list may not describe all possible side effects. Call your doctor for medical advice about side effects. You may report side effects to FDA at  1-800-FDA-1088. °Where should I keep my medication? °Keep out of the reach of children and pets. This medication can be abused. Keep it in a safe place to protect it from theft. Do not share it with anyone. It is only for you. Selling or giving away this medication is dangerous and against the law. °Store between 20 and 25 degrees C (68 and 77 degrees F). Protect from light and moisture. Keep the container tightly closed. Get rid of any unused medication after the expiration date. °This medication may cause harm and death if it is taken by other adults, children, or pets. It is important to get rid of the medication as soon as you no longer need it or it is expired. You can do this in two ways: °Take the medication to a medication take-back program. Check with your pharmacy or law enforcement to find a location. °If you cannot return the medication, check the label or package insert to see if the medication should be thrown out in the garbage or flushed down the toilet. If you are not sure, ask your care team. If it is safe to put it in the trash, take the medication out of the container. Mix the medication with cat litter, dirt, coffee grounds, or other unwanted substance. Seal the mixture in a bag or container. Put it in the trash. °NOTE: This sheet is a summary. It may not cover all possible information. If you have questions about this medicine, talk to your doctor, pharmacist, or health care provider. °©   2022 [employer] (2021-06-07 00:00:00)

## 2021-12-20 ENCOUNTER — Encounter: Payer: Self-pay | Admitting: Psychiatry

## 2021-12-20 ENCOUNTER — Other Ambulatory Visit: Payer: Self-pay

## 2021-12-20 ENCOUNTER — Ambulatory Visit (INDEPENDENT_AMBULATORY_CARE_PROVIDER_SITE_OTHER): Payer: Medicare Other | Admitting: Psychiatry

## 2021-12-20 VITALS — BP 95/61 | HR 71 | Temp 97.9°F | Wt 145.0 lb

## 2021-12-20 DIAGNOSIS — F411 Generalized anxiety disorder: Secondary | ICD-10-CM

## 2021-12-20 DIAGNOSIS — F5105 Insomnia due to other mental disorder: Secondary | ICD-10-CM | POA: Diagnosis not present

## 2021-12-20 DIAGNOSIS — F3176 Bipolar disorder, in full remission, most recent episode depressed: Secondary | ICD-10-CM | POA: Diagnosis not present

## 2021-12-20 MED ORDER — DOXEPIN HCL 10 MG PO CAPS: ORAL_CAPSULE | ORAL | 1 refills | Status: DC

## 2021-12-20 MED ORDER — FLUOXETINE HCL 20 MG PO CAPS: 20.0000 mg | ORAL_CAPSULE | Freq: Every day | ORAL | 1 refills | Status: DC

## 2021-12-20 NOTE — Progress Notes (Signed)
[place] [facility] MD OP Progress Note  12/20/2021 1:29 PM Jennifer [name]  MRN:  [id number]  Chief Complaint:  Chief Complaint  Patient presents with   Follow-up: 66 year old Caucasian female with history of bipolar disorder, GAD, insomnia, chronic pain, presents for medication management.   HPI: Jennifer [name] is a 66 year old Caucasian female, lives in [place], married, has a history of bipolar disorder, GAD, insomnia, neuroleptic induced dystonia, chronic pain was evaluated in office today.  Patient today reports sleep has been restless since she ran out of doxepin.  Although doxepin was sent to her pharmacy for 90-day supply in December she reports she never received that supply.  Patient hence has been using melatonin with Benadryl which helps to some extent.  She wants to go back on the doxepin.  Patient reports overall mood symptoms are stable.  She does have anxiety about her current situational stressors, relationship struggles however overall she is handling it well.  The Prozac does help.  Patient is currently tolerating the gabapentin well which helps her with her mood as well as pain.  Patient does report abnormal involuntary movements of the time on and off although this was not observed in session.  Patient reports it has gotten better however she does have it at times.  Likely due to her antidepressant, however patient is not interested in coming of the Prozac at this time.  Discussed referral to neurology however she is not interested.  Patient denies any suicidality, homicidality or perceptual disturbances.  Patient denies any other concerns today.  Visit Diagnosis:    ICD-10-CM   1. Bipolar disorder, in full remission, most recent episode depressed ([place])  F31.76     2. Generalized anxiety disorder  F41.1 FLUoxetine (PROZAC) 20 MG capsule    doxepin (SINEQUAN) 10 MG capsule    3. Insomnia due to mental condition  F51.05 doxepin (SINEQUAN) 10 MG capsule   Anxiety      Past  Psychiatric History: Reviewed past psychiatric history from progress note on 02/05/2018.  Past Medical History:  Past Medical History:  Diagnosis Date   Anemia 1985   bled during a cervical biopsy, led to bleed transfusion    Anginal pain ([place])    Anxiety    Arthritis    pt. reports that her neck is stiff    Bipolar depression ([place])    Blood dyscrasia    reported hx [name] '85; saw hematologist Dr. 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 Gar [facility]    PCP   CRD (chronic renal disease)    Depression    Depression with anxiety    Diabetes mellitus    Dyspnea    W/ EXERTION    Enlarged liver    Family history of adverse reaction to anesthesia    father woke up slowly   Fibromyalgia    Generalized anxiety disorder    GERD (gastroesophageal reflux disease)    Hyperlipidemia    Hypertension    Hypothyroidism    Low back pain    Low sodium levels    Migraines    Neuropathy    Neuropathy    Persistent disorder of initiating or maintaining sleep    Restless leg  Von Willebrand disease     Past Surgical History:  Procedure Laterality Date   ABDOMINAL HYSTERECTOMY     ANTERIOR LAT LUMBAR FUSION N/A 12/10/2016   Procedure: LUMBAR FOUR-FIVE  Anterior lateral lumbar interbody fusion with LUMBAR FOUR-FIVE  Posterolateral fusion/Re-operative laminectomy LUMBAR FIVE-SACRUM ONE Bilateral, Laminectomy at LUMBAR FOUR-FIVE , 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  and Left LUMBAR FOUR-FIVE  with Mazor;  Surgeon: Kevan Ny Ditty, MD;  Location: [facility];  Service: Neurosurgery;  Laterality: N/A;   APPLICATION OF ROBOTIC ASSISTANCE FOR SPINAL PROCEDURE N/A 12/10/2016   Procedure: APPLICATION OF ROBOTIC ASSISTANCE FOR SPINAL PROCEDURE;  Surgeon: Kevan Ny Ditty, MD;  Location: [facility];  Service: Neurosurgery;  Laterality: N/A;   BACK SURGERY  2007   lumbar   BREAST IMPLANT REMOVAL     CARDIAC CATHETERIZATION     CAD,PCI of LAD, Cypher stent 2.5-28mm   CARDIAC CATHETERIZATION     PCI of mid-LAD in stent restenosis with a drug-eluting stent   CARDIAC CATHETERIZATION  06/03/13   [facility];MID LAD 100 % STENOSIS; PRIOR STENT; MID RCA 40 % STENOSIS   COLONOSCOPY WITH PROPOFOL N/A 08/13/2018   Procedure: COLONOSCOPY WITH PROPOFOL;  Surgeon: [name], MD;  Location[facility] ENDOSCOPY;  Service: Gastroenterology;  Laterality: N/A;   CORONARY ARTERY BYPASS GRAFT   08-12-2011   CABG x 3   CORONARY STENT PLACEMENT     ESOPHAGOGASTRODUODENOSCOPY (EGD) WITH PROPOFOL N/A 08/13/2018   Procedure: ESOPHAGOGASTRODUODENOSCOPY (EGD) WITH PROPOFOL;  Surgeon: [name], MD;  Location: [facility] ENDOSCOPY;  Service: Gastroenterology;  Laterality: N/A;   LUMBAR FUSION     L1-S1   LUMBAR PERCUTANEOUS PEDICLE SCREW 1 LEVEL N/A 12/10/2016   Procedure: Extension of pedicle screw fixation to LUMBAR FOUR;  Surgeon: Kevan Ny Ditty, MD;  Location: [facility];  Service: Neurosurgery;  Laterality: N/A;   NASAL SINUS SURGERY  1997   tuirbinate reduction    RECTAL EXAM UNDER ANESTHESIA N/A 10/06/2018   Procedure: RECTAL EXAM UNDER ANESTHESIA;  Surgeon: Jules Husbands, MD;  Location: [facility] ORS;  Service: General;  Laterality: N/A;    Family Psychiatric History: Reviewed family psychiatric history from progress note on 02/05/2018.  Family History:  Family History  Problem Relation Age of Onset   Aneurysm Father        abdominal   Diabetes Father    Alcohol abuse Father    Hypertension Father    Aneurysm Brother        abdominal   Alcohol abuse Brother    Hypertension Brother    Colon cancer Brother    Diabetes Mother    Alcohol abuse Mother    Hypertension Other        family hx   Hyperlipidemia Other        family hx   Diabetes Other        family hx    Social  History: Reviewed social history from progress note on 02/05/2018. Social History   Socioeconomic History   Marital status: Married    Spouse name: Photographer   Number of children: 3   Years of education: Not on file   Highest education level: Associate degree: occupational, Hotel manager, or vocational program  Occupational History   Not on file  Tobacco Use   Smoking status: Former    Years: 16.00    Types: Cigarettes    Quit date: 06/20/2010    Years since quitting: 11.5   Smokeless tobacco: Never  Vaping Use  Vaping Use: Every day  Substance and Sexual Activity   Alcohol use: No    Alcohol/week: 0.0 standard drinks   Drug use: No   Sexual activity: Yes  Other Topics Concern   Not on file  Social History Narrative   Not on file   Social Determinants of Health   Financial Resource Strain: Not on file  Food Insecurity: Not on file  Transportation Needs: Not on file  Physical Activity: Not on file  Stress: Not on file  Social Connections: Not on file    Allergies:  Allergies  Allergen Reactions   Aspirin Other (See Comments)    Metabolic Disorder Labs: Lab Results  Component Value Date   HGBA1C 7.2 (H) 01/01/2020   MPG 159.94 01/01/2020   MPG 223.08 12/05/2017   Lab Results  Component Value Date   PROLACTIN 6.1 12/05/2017   Lab Results  Component Value Date   CHOL 238 (H) 12/05/2017   TRIG 819 (H) 12/05/2017   HDL 41 12/05/2017   CHOLHDL 5.8 12/05/2017   VLDL UNABLE TO CALCULATE IF TRIGLYCERIDE OVER 400 mg/dL 12/05/2017   LDLCALC UNABLE TO CALCULATE IF TRIGLYCERIDE OVER 400 mg/dL 12/05/2017   LDLCALC SEE COMMENT 07/09/2012   Lab Results  Component Value Date   TSH 2.600 08/03/2018   TSH 86.000 (H) 12/05/2017    Therapeutic Level Labs: No results found for: LITHIUM No results found for: VALPROATE No components found for:  CBMZ  Current Medications: Current Outpatient Medications  Medication Sig Dispense Refill   albuterol-ipratropium (COMBIVENT)  18-103 MCG/ACT inhaler Inhale 2 puffs into the lungs daily as needed for wheezing or shortness of breath.      amLODipine (NORVASC) 10 MG tablet Take 1 tablet (10 mg total) by mouth daily. 90 tablet 3   atorvastatin (LIPITOR) 80 MG tablet Take 1 tablet (80 mg total) by mouth daily. 90 tablet 3   carisoprodol (SOMA) 350 MG tablet Take 1 tablet (350 mg total) by mouth 2 (two) times daily as needed for muscle spasms.     carvedilol (COREG) 12.5 MG tablet Take 1 tablet (12.5 mg total) by mouth 2 (two) times daily with a meal. 180 tablet 3   Continuous Blood Gluc Receiver (FREESTYLE LIBRE 2 READER) DEVI Use 1 Device as directed     DETROL LA 4 MG 24 hr capsule Take 4 mg by mouth daily.     diclofenac Sodium (VOLTAREN) 1 % GEL SMARTSIG:2-4 Gram(s) Topical 4 Times Daily     doxepin (SINEQUAN) 10 MG capsule TAKE 1 TO 2 CAPSULES AT BEDTIME AS NEEDED (DOSE REDUCTION) 180 capsule 1   estradiol (ESTRING) 2 MG vaginal ring Place vaginally.     ezetimibe (ZETIA) 10 MG tablet Take 10 mg by mouth daily.     FLUoxetine (PROZAC) 20 MG capsule Take 1 capsule (20 mg total) by mouth daily. 90 capsule 1   fluticasone (FLONASE) 50 MCG/ACT nasal spray Place 1 spray into both nostrils daily as needed.      gabapentin (NEURONTIN) 400 MG capsule Take 1 capsule (400 mg total) by mouth at bedtime. 90 capsule 1   glucose blood (FREESTYLE LITE) test strip 3 (three) times daily     HUMULIN 70/30 KWIKPEN (70-30) 100 UNIT/ML PEN Inject 10-15 Units into the skin See admin instructions. Inject 10-15 units SQ in the morning and inject 10-12 units SQ at bedtime per sliding scale     levothyroxine (SYNTHROID) 25 MCG tablet Take by mouth.     meclizine (ANTIVERT)  25 MG tablet Take 25 mg by mouth 3 (three) times daily as needed.     metFORMIN (GLUCOPHAGE-XR) 750 MG 24 hr tablet Take 750 mg by mouth daily with breakfast.     metFORMIN (GLUCOPHAGE-XR) 750 MG 24 hr tablet Take 750 mg by mouth in the morning and at bedtime.     naloxone  (NARCAN) nasal spray 4 mg/0.1 mL SMARTSIG:Both Nares     nitroGLYCERIN (NITROSTAT) 0.4 MG SL tablet Place 1 tablet (0.4 mg total) under the tongue every 5 (five) minutes as needed for chest pain. 25 tablet 6   omeprazole (PRILOSEC) 20 MG capsule Take by mouth.     omeprazole (PRILOSEC) 40 MG capsule Take 40 mg by mouth daily.      Oxycodone HCl 10 MG TABS LIMIT ONE HALF TO ONE TABLET BY MOUTH FOUR TO SIX TIMES PER DAY IF TOLERATED (NO HYDROCODONE ACETAMINOPHEN)     RESTASIS 0.05 % ophthalmic emulsion Place 1 drop into both eyes daily as needed (for dryness).     rOPINIRole (REQUIP) 0.5 MG tablet Take 1 mg by mouth at bedtime.      telmisartan (MICARDIS) 80 MG tablet Take 1 tablet (80 mg total) by mouth daily. 90 tablet 3   sitaGLIPtin (JANUVIA) 100 MG tablet Take 100 mg by mouth daily.     No current facility-administered medications for this visit.     Musculoskeletal: Strength & Muscle Tone: within normal limits Gait & Station: normal Patient leans: N/A  Psychiatric Specialty Exam: Review of Systems  Musculoskeletal:  Positive for arthralgias.  Psychiatric/Behavioral:  Positive for sleep disturbance. The patient is nervous/anxious.   All other systems reviewed and are negative.  Blood pressure 95/61, pulse 71, temperature 97.9 F (36.6 C), temperature source Temporal, weight 145 lb (65.8 kg).Body mass index is 25.69 kg/m.  General Appearance: Casual  Eye Contact:  Fair  Speech:  Clear and Coherent  Volume:  Normal  Mood:  Anxious  Affect:  Congruent  Thought Process:  Goal Directed and Descriptions of Associations: Intact  Orientation:  Full (Time, Place, and Person)  Thought Content: Logical   Suicidal Thoughts:  No  Homicidal Thoughts:  No  Memory:  Immediate;   Fair Recent;   Fair Remote;   Fair  Judgement:  Fair  Insight:  Fair  Psychomotor Activity:  Normal  Concentration:  Concentration: Fair and Attention Span: Fair  Recall:  [employer] of Knowledge: Fair   Language: Fair  Akathisia:  No  Handed:  Right  AIMS (if indicated): done  Assets:  Communication Skills Desire for Improvement Housing Social Support Transportation  ADL's:  Intact  Cognition: WNL  Sleep:   restless due to not taking Doxepin   Screenings: [place] Office Visit from 12/20/2021 in [facility] [place] Office Visit from 12/23/2018 in [facility] Office Visit from 08/18/2018 in [facility] Total Score 3 5 6       [place] Office Visit from 11/15/2021 in [facility] Video Visit from 04/10/2021 in [facility]  Total GAD-7 Score 4 16      PHQ2-9    [place] Visit from 12/20/2021 in [facility] Visit from 11/15/2021 in [facility] Video Visit from 04/10/2021 in [facility] Visit from 05/09/2016 in [facility] Procedure visit from 03/20/2016 in [place]  [facility]  PHQ-2 Total Score 1 0 2 0 0  PHQ-9 Total Score 8 -- 7 -- --      [place] Office Visit from 12/20/2021 in [place] Video Visit from 04/10/2021 in [facility] No Risk Low Risk        Assessment and Plan: CHAUNDRA ABREU is a 66 year old Caucasian female who has a history of bipolar disorder, anxiety disorder was evaluated in office today.  Patient with sleep problems likely due to not being on doxepin, patient also with possible adverse side effects to medication, will benefit from following plan.  Plan Bipolar disorder in remission Gabapentin 400 mg p.o. nightly  GAD-improving Prozac 20 mg p.o. daily  Insomnia-unstable Restart doxepin 10 to 20 mg p.o.  nightly Continue gabapentin as prescribed   Abnormal involuntary movements unspecified-unstable Patient with abnormal movements of the tongue-although not observed in session Discussed referral to neurology Discussed reducing the dosage of Prozac. Patient is not interested.  We will reevaluate in future sessions.  Follow-up in clinic in 3 months or sooner if needed.   Collaboration of Care: Collaboration of Care: Patient refused AEB discussed referral for neurology evaluation for abnormal movements-patient declined.  Patient/Guardian was advised Release of Information must be obtained prior to any record release in order to collaborate their care with an outside provider. Patient/Guardian was advised if they have not already done so to contact the registration department to sign all necessary forms in order for [place] to release information regarding their care.   Consent: Patient/Guardian gives verbal consent for treatment and assignment of benefits for services provided during this visit. Patient/Guardian expressed understanding and agreed to proceed.   This note was generated in part or whole with voice recognition software. Voice recognition is usually quite accurate but there are transcription errors that can and very often do occur. I apologize for any typographical errors that were not detected and corrected.      Ursula Alert, MD 12/21/2021, 8:20 AM

## 2022-03-27 ENCOUNTER — Ambulatory Visit: Payer: Medicare Other | Admitting: Psychiatry

## 2022-04-17 ENCOUNTER — Encounter: Payer: Self-pay | Admitting: Psychiatry

## 2022-04-17 ENCOUNTER — Ambulatory Visit (INDEPENDENT_AMBULATORY_CARE_PROVIDER_SITE_OTHER): Payer: Medicare Other | Admitting: Psychiatry

## 2022-04-17 VITALS — BP 120/74 | HR 79 | Temp 98.1°F | Wt 143.4 lb

## 2022-04-17 DIAGNOSIS — F5105 Insomnia due to other mental disorder: Secondary | ICD-10-CM | POA: Diagnosis not present

## 2022-04-17 DIAGNOSIS — F3176 Bipolar disorder, in full remission, most recent episode depressed: Secondary | ICD-10-CM

## 2022-04-17 DIAGNOSIS — F411 Generalized anxiety disorder: Secondary | ICD-10-CM | POA: Diagnosis not present

## 2022-04-17 NOTE — Progress Notes (Signed)
[facility] MD OP Progress Note  04/17/2022 4:43 PM Jennifer [name]  MRN:  [id number]  Chief Complaint:  Chief Complaint  Patient presents with   Follow-up: 66 year old Caucasian female with history of bipolar disorder, GAD, insomnia, chronic pain, presented for medication management.   HPI: Jennifer [name] is a 66 year old Caucasian female, lives in [place], married, has a history of bipolar disorder, GAD, insomnia, neuroleptic induced dystonia, chronic pain was evaluated in office today.  Patient today reports she just returned after spending a few days with her daughter in [place].  Patient reports she had a very good time with her daughter and that has helped her with her mood symptoms.  Patient continues to have situational stressors of relationship struggles with her son, taking care of her grandchildren and so on.  Patient reports overall mood symptoms as currently stable.  Currently compliant on Prozac.  Patient does have sleep problems on and off.  Currently takes Benadryl as needed which helps to some extent.  Was prescribed doxepin however has not been using it.  Reports she was worried that it could be habit forming.  Continues to have chronic pain, currently under the care of pain management.  Pain is currently tolerable on current medication regimen.  Patient denies any suicidality, homicidality or perceptual disturbances.  Patient denies any other concerns today.  Visit Diagnosis:    ICD-10-CM   1. Bipolar disorder, in full remission, most recent episode depressed ([place])  F31.76     2. Generalized anxiety disorder  F41.1     3. Insomnia due to mental condition  F51.05    Anxiety      Past Psychiatric History: Reviewed past psychiatric history from progress note on 02/05/2018.  Past Medical History:  Past Medical History:  Diagnosis Date   Anemia 1985   bled during a cervical biopsy, led to bleed transfusion    Anginal pain ([place])    Anxiety    Arthritis    pt.  reports that her neck is stiff    Bipolar depression ([place])    Blood dyscrasia    reported hx [name] '85; saw hematologist Dr. 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 Gar [facility]    PCP   CRD (chronic renal disease)    Depression    Depression with anxiety    Diabetes mellitus    Dyspnea    W/ EXERTION    Enlarged liver    Family history of adverse reaction to anesthesia    father woke up slowly   Fibromyalgia    Generalized anxiety disorder    GERD (gastroesophageal reflux disease)    Hyperlipidemia    Hypertension    Hypothyroidism    Low back pain    Low sodium levels    Migraines    Neuropathy    Neuropathy    Persistent disorder of initiating or maintaining sleep    Restless leg    Von Willebrand disease ([place])     Past Surgical History:  Procedure Laterality Date   ABDOMINAL HYSTERECTOMY     ANTERIOR LAT LUMBAR FUSION N/A 12/10/2016   Procedure: LUMBAR FOUR-FIVE  Anterior lateral lumbar interbody fusion with LUMBAR FOUR-FIVE  Posterolateral fusion/Re-operative laminectomy  LUMBAR FIVE-SACRUM ONE Bilateral, Laminectomy at LUMBAR FOUR-FIVE , 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  and Left LUMBAR FOUR-FIVE  with Mazor;  Surgeon: Kevan Ny Ditty, MD;  Location: [place];  Service: Neurosurgery;  Laterality: N/A;   APPLICATION OF ROBOTIC ASSISTANCE FOR SPINAL PROCEDURE N/A 12/10/2016   Procedure: APPLICATION OF ROBOTIC ASSISTANCE FOR SPINAL PROCEDURE;  Surgeon: Kevan Ny Ditty, MD;  Location: [place];  Service: Neurosurgery;  Laterality: N/A;   BACK SURGERY  2007   lumbar   BREAST IMPLANT REMOVAL     CARDIAC CATHETERIZATION     CAD,PCI of LAD, Cypher stent 2.5-28mm   CARDIAC CATHETERIZATION     PCI of mid-LAD in stent restenosis with a  drug-eluting stent   CARDIAC CATHETERIZATION  06/03/13   [facility];MID LAD 100 % STENOSIS; PRIOR STENT; MID RCA 40 % STENOSIS   COLONOSCOPY WITH PROPOFOL N/A 08/13/2018   Procedure: COLONOSCOPY WITH PROPOFOL;  Surgeon: [name], MD;  Location: [facility] ENDOSCOPY;  Service: Gastroenterology;  Laterality: N/A;   CORONARY ARTERY BYPASS GRAFT   08-12-2011   CABG x 3   CORONARY STENT PLACEMENT     ESOPHAGOGASTRODUODENOSCOPY (EGD) WITH PROPOFOL N/A 08/13/2018   Procedure: ESOPHAGOGASTRODUODENOSCOPY (EGD) WITH PROPOFOL;  Surgeon: [name], MD;  Location: [facility] ENDOSCOPY;  Service: Gastroenterology;  Laterality: N/A;   LUMBAR FUSION     L1-S1   LUMBAR PERCUTANEOUS PEDICLE SCREW 1 LEVEL N/A 12/10/2016   Procedure: Extension of pedicle screw fixation to LUMBAR FOUR;  Surgeon: Kevan Ny Ditty, MD;  Location: [place];  Service: Neurosurgery;  Laterality: N/A;   NASAL SINUS SURGERY  1997   tuirbinate reduction    RECTAL EXAM UNDER ANESTHESIA N/A 10/06/2018   Procedure: RECTAL EXAM UNDER ANESTHESIA;  Surgeon: Jules Husbands, MD;  Location: [facility] ORS;  Service: General;  Laterality: N/A;    Family Psychiatric History: Reviewed family psychiatric history from progress note on 02/05/2018.  Family History:  Family History  Problem Relation Age of Onset   Aneurysm Father        abdominal   Diabetes Father    Alcohol abuse Father    Hypertension Father    Aneurysm Brother        abdominal   Alcohol abuse Brother    Hypertension Brother    Colon cancer Brother    Diabetes Mother    Alcohol abuse Mother    Hypertension Other        family hx   Hyperlipidemia Other        family hx   Diabetes Other        family hx    Social History: Reviewed social history from progress note on 02/05/2018. Social History   Socioeconomic History   Marital status: Married    Spouse name: Photographer   Number of children: 3   Years of education: Not on file   Highest education level: Associate degree: occupational,  Hotel manager, or vocational program  Occupational History   Not on file  Tobacco Use   Smoking status: Former    Years: 16.00    Types: Cigarettes    Quit date: 06/20/2010    Years since quitting: 11.8   Smokeless tobacco: Never  Vaping Use   Vaping Use: Every day  Substance and Sexual Activity   Alcohol use: No    Alcohol/week: 0.0 standard drinks of alcohol   Drug use: No   Sexual activity: Yes  Other Topics Concern   Not on file  Social History  Narrative   Not on file   Social Determinants of Health   Financial Resource Strain: Not on file  Food Insecurity: Not on file  Transportation Needs: No Transportation Needs (01/23/2018)   PRAPARE - Hydrologist (Medical): No    Lack of Transportation (Non-Medical): No  Physical Activity: Inactive (01/23/2018)   Exercise Vital Sign    Days of Exercise per Week: 0 days    Minutes of Exercise per Session: 0 min  Stress: Stress Concern Present (01/23/2018)   [place]    Feeling of Stress : Rather much  Social Connections: Unknown (01/23/2018)   Social Connection and Isolation Panel [NHANES]    Frequency of Communication with Friends and Family: Not on file    Frequency of Social Gatherings with Friends and Family: Not on file    Attends Religious Services: More than 4 times per year    Active Member of [employer] or Organizations: No    Attends Archivist Meetings: Never    Marital Status: Married    Allergies:  Allergies  Allergen Reactions   Aspirin Other (See Comments)    Metabolic Disorder Labs: Lab Results  Component Value Date   HGBA1C 7.2 (H) 01/01/2020   MPG 159.94 01/01/2020   MPG 223.08 12/05/2017   Lab Results  Component Value Date   PROLACTIN 6.1 12/05/2017   Lab Results  Component Value Date   CHOL 238 (H) 12/05/2017   TRIG 819 (H) 12/05/2017   HDL 41 12/05/2017   CHOLHDL 5.8 12/05/2017   VLDL UNABLE TO  CALCULATE IF TRIGLYCERIDE OVER 400 mg/dL 12/05/2017   LDLCALC UNABLE TO CALCULATE IF TRIGLYCERIDE OVER 400 mg/dL 12/05/2017   LDLCALC SEE COMMENT 07/09/2012   Lab Results  Component Value Date   TSH 2.600 08/03/2018   TSH 86.000 (H) 12/05/2017    Therapeutic Level Labs: No results found for: "LITHIUM" No results found for: "VALPROATE" No results found for: "CBMZ"  Current Medications: Current Outpatient Medications  Medication Sig Dispense Refill   albuterol-ipratropium (COMBIVENT) 18-103 MCG/ACT inhaler Inhale 2 puffs into the lungs daily as needed for wheezing or shortness of breath.      amLODipine (NORVASC) 10 MG tablet Take 1 tablet (10 mg total) by mouth daily. 90 tablet 3   atorvastatin (LIPITOR) 80 MG tablet Take 1 tablet (80 mg total) by mouth daily. 90 tablet 3   carisoprodol (SOMA) 350 MG tablet Take 1 tablet (350 mg total) by mouth 2 (two) times daily as needed for muscle spasms.     carvedilol (COREG) 12.5 MG tablet Take 1 tablet (12.5 mg total) by mouth 2 (two) times daily with a meal. 180 tablet 3   Continuous Blood Gluc Receiver (FREESTYLE LIBRE 2 READER) DEVI Use 1 Device as directed     DETROL LA 4 MG 24 hr capsule Take 4 mg by mouth daily.     diclofenac Sodium (VOLTAREN) 1 % GEL SMARTSIG:2-4 Gram(s) Topical 4 Times Daily     doxepin (SINEQUAN) 10 MG capsule TAKE 1 TO 2 CAPSULES AT BEDTIME AS NEEDED (DOSE REDUCTION) 180 capsule 1   estradiol (ESTRING) 2 MG vaginal ring Place vaginally.     ezetimibe (ZETIA) 10 MG tablet Take 10 mg by mouth daily.     FLUoxetine (PROZAC) 20 MG capsule Take 1 capsule (20 mg total) by mouth daily. 90 capsule 1   fluticasone (FLONASE) 50 MCG/ACT nasal spray Place 1 spray into both  nostrils daily as needed.      glucose blood (FREESTYLE LITE) test strip 3 (three) times daily     HUMULIN 70/30 KWIKPEN (70-30) 100 UNIT/ML PEN Inject 10-15 Units into the skin See admin instructions. Inject 10-15 units SQ in the morning and inject 10-12  units SQ at bedtime per sliding scale     levothyroxine (SYNTHROID) 25 MCG tablet Take by mouth.     metFORMIN (GLUCOPHAGE-XR) 750 MG 24 hr tablet Take 750 mg by mouth in the morning and at bedtime.     naloxone (NARCAN) nasal spray 4 mg/0.1 mL SMARTSIG:Both Nares     nitroGLYCERIN (NITROSTAT) 0.4 MG SL tablet Place 1 tablet (0.4 mg total) under the tongue every 5 (five) minutes as needed for chest pain. 25 tablet 6   omeprazole (PRILOSEC) 20 MG capsule Take by mouth.     Oxycodone HCl 10 MG TABS LIMIT ONE HALF TO ONE TABLET BY MOUTH FOUR TO SIX TIMES PER DAY IF TOLERATED (NO HYDROCODONE ACETAMINOPHEN)     RESTASIS 0.05 % ophthalmic emulsion Place 1 drop into both eyes daily as needed (for dryness).     rOPINIRole (REQUIP) 0.5 MG tablet Take 1 mg by mouth at bedtime.      telmisartan (MICARDIS) 80 MG tablet Take 1 tablet (80 mg total) by mouth daily. 90 tablet 3   sitaGLIPtin (JANUVIA) 100 MG tablet Take 100 mg by mouth daily.     No current facility-administered medications for this visit.     Musculoskeletal: Strength & Muscle Tone: within normal limits Gait & Station: normal Patient leans: N/A  Psychiatric Specialty Exam: Review of Systems  Musculoskeletal:  Positive for back pain.  Psychiatric/Behavioral:  Positive for sleep disturbance.   All other systems reviewed and are negative.   Blood pressure 120/74, pulse 79, temperature 98.1 F (36.7 C), temperature source Temporal, weight 143 lb 6.4 oz (65 kg).Body mass index is 25.4 kg/m.  General Appearance: Casual  Eye Contact:  Fair  Speech:  Clear and Coherent  Volume:  Normal  Mood:  Euthymic  Affect:  Congruent  Thought Process:  Goal Directed and Descriptions of Associations: Intact  Orientation:  Full (Time, Place, and Person)  Thought Content: Logical   Suicidal Thoughts:  No  Homicidal Thoughts:  No  Memory:  Immediate;   Fair Recent;   Fair Remote;   Fair  Judgement:  Fair  Insight:  Fair  Psychomotor Activity:   Normal  Concentration:  Concentration: Fair and Attention Span: Fair  Recall:  [employer] of Knowledge: Fair  Language: Fair  Akathisia:  No  Handed:  Right  AIMS (if indicated): done  Assets:  Communication Skills Desire for Improvement Housing Social Support  ADL's:  Intact  Cognition: WNL  Sleep:  Poor   Screenings: [place] Office Visit from 04/17/2022 in [place] Office Visit from 12/20/2021 in [place] Visit from 12/23/2018 in [place] Visit from 08/18/2018 in [place] Total Score 0 '3 5 6      '$ [place] Office Visit from 11/15/2021 in [place] Video Visit from 04/10/2021 in [place]  Total GAD-7 Score 4 16      PHQ2-9    [place] Visit from 04/17/2022 in [place] Visit from 12/20/2021 in [place] Visit from 11/15/2021 in [place]  [facility] Video Visit from 04/10/2021 in [place] Office Visit from 05/09/2016 in [place]  PHQ-2 Total Score 2 1 0 2 0  PHQ-9 Total Score 10 8 -- 7 --      [place] Office Visit from 04/17/2022 in [place] Office Visit from 12/20/2021 in [place] Video Visit from 04/10/2021 in [place] No Risk No Risk Low Risk        Assessment and Plan: Jennifer [name] is a 66 year old Caucasian female who has a history of bipolar disorder, anxiety disorder was evaluated in office today.  Patient is currently having sleep problems, will benefit from the following plan.  Plan Bipolar disorder in remission  Gabapentin 400  mg p.o. nightly  GAD-improving Prozac 20 mg p.o. daily  Insomnia-unstable Encouraged compliance with doxepin 10 to 20 mg p.o. nightly She could also use Benadryl 25 mg as needed.  However advised to limit use. Continue gabapentin as prescribed. Discussed sleep hygiene techniques.  Abnormal involuntary movements-unspecified-resolved Patient currently does not have involuntary movements of her tongue. We will continue to monitor closely.  Follow-up in clinic in 3 months or sooner if needed.  This note was generated in part or whole with voice recognition software. Voice recognition is usually quite accurate but there are transcription errors that can and very often do occur. I apologize for any typographical errors that were not detected and corrected.     Ursula Alert, MD 04/17/2022, 4:43 PM

## 2022-07-04 ENCOUNTER — Ambulatory Visit
Admission: RE | Admit: 2022-07-04 | Discharge: 2022-07-04 | Disposition: A | Discharge status: Home / Self Care | Payer: Medicare Other | Source: Ambulatory Visit | Attending: Family Medicine | Admitting: Family Medicine

## 2022-07-04 ENCOUNTER — Ambulatory Visit
Admission: RE | Admit: 2022-07-04 | Discharge: 2022-07-04 | Disposition: A | Discharge status: Home / Self Care | Payer: Medicare Other | Attending: Family Medicine | Admitting: Family Medicine

## 2022-07-04 ENCOUNTER — Other Ambulatory Visit: Payer: Self-pay | Admitting: Family Medicine

## 2022-07-04 DIAGNOSIS — R52 Pain, unspecified: Secondary | ICD-10-CM

## 2022-07-17 ENCOUNTER — Encounter: Payer: Self-pay | Admitting: Psychiatry

## 2022-07-17 ENCOUNTER — Ambulatory Visit (INDEPENDENT_AMBULATORY_CARE_PROVIDER_SITE_OTHER): Payer: Medicare Other | Admitting: Psychiatry

## 2022-07-17 VITALS — BP 95/63 | HR 139 | Temp 97.6°F | Wt 149.2 lb

## 2022-07-17 DIAGNOSIS — F411 Generalized anxiety disorder: Secondary | ICD-10-CM

## 2022-07-17 DIAGNOSIS — F5105 Insomnia due to other mental disorder: Secondary | ICD-10-CM | POA: Diagnosis not present

## 2022-07-17 DIAGNOSIS — F3131 Bipolar disorder, current episode depressed, mild: Secondary | ICD-10-CM | POA: Diagnosis not present

## 2022-07-17 DIAGNOSIS — Z9189 Other specified personal risk factors, not elsewhere classified: Secondary | ICD-10-CM | POA: Insufficient documentation

## 2022-07-17 NOTE — Patient Instructions (Addendum)
Divalproex Sodium Delayed- or Extended-Release Tablets What is this medication? DIVALPROEX SODIUM (dye VAL pro ex SO dee um) prevents and controls seizures in people with epilepsy. It may also be used to prevent migraine headaches. It can also be used to treat bipolar disorder. It works by calming overactive nerves in your body. This medicine may be used for other purposes; ask your health care provider or pharmacist if you have questions. COMMON BRAND NAME(S): Depakote, Depakote ER What should I tell my care team before I take this medication? They need to know if you have any of these conditions: Frequently drink alcohol Kidney disease Liver disease Low platelet counts Mitochondrial disease Suicidal thoughts, plans, or attempt by you or a family member Urea cycle disorder (UCD) An unusual or allergic reaction to divalproex sodium, sodium valproate, valproic acid, other medications, foods, dyes, or preservatives Pregnant or trying to get pregnant Breast-feeding How should I use this medication? Take this medication by mouth with a drink of water. Follow the directions on the prescription label. Do not cut, crush or chew this medication. You can take it with or without food. If it upsets your stomach, take it with food. Take your medication at regular intervals. Do not take it more often than directed. Do not stop taking except on your care team's advice. A special MedGuide will be given to you by the pharmacist with each prescription and refill. Be sure to read this information carefully each time. Talk to your care team about the use of this medication in children. While this medication may be prescribed for children as young as 10 years for selected conditions, precautions do apply. Overdosage: If you think you have taken too much of this medicine contact a poison control center or emergency room at once. NOTE: This medicine is only for you. Do not share this medicine with others. What if I  miss a dose? If you miss a dose, take it as soon as you can. If it is almost time for your next dose, take only that dose. Do not take double or extra doses. What may interact with this medication? Do not take this medication with any of the following: Sodium phenylbutyrate This medication may also interact with the following: Aspirin Certain antibiotics, such as ertapenem, imipenem, meropenem Certain medications for depression, anxiety, or other mental health conditions Certain medications for seizures, such as cannabidiol, carbamazepine, clonazepam, diazepam, ethosuximide, felbamate, lamotrigine, phenobarbital, phenytoin, primidone, rufinamide, topiramate Certain medications that treat or prevent blood clots, such as warfarin Cholestyramine Estrogen and progestin hormones Methotrexate Propofol Rifampin Ritonavir Tolbutamide Zidovudine This list may not describe all possible interactions. Give your health care provider a list of all the medicines, herbs, non-prescription drugs, or dietary supplements you use. Also tell them if you smoke, drink alcohol, or use illegal drugs. Some items may interact with your medicine. What should I watch for while using this medication? Tell your care team if your symptoms do not get better or they start to get worse. This medication may cause serious skin reactions. They can happen weeks to months after starting the medication. Contact your care team right away if you notice fevers or flu-like symptoms with a rash. The rash may be red or purple and then turn into blisters or peeling of the skin. Or, you might notice a red rash with swelling of the face, lips or lymph nodes in your neck or under your arms. Wear a medical ID bracelet or chain, and carry a card that describes   your disease and details of your medication and dosage times. You may get drowsy, dizzy, or have blurred vision. Do not drive, use machinery, or do anything that needs mental alertness  until you know how this medication affects you. To reduce dizzy or fainting spells, do not sit or stand up quickly, especially if you are an older patient. Alcohol can increase drowsiness and dizziness. Avoid alcoholic drinks. This medication can make you more sensitive to the sun. Keep out of the sun. If you cannot avoid being in the sun, wear protective clothing and use sunscreen. Do not use sun lamps or tanning beds/booths. Patients and their families should watch out for new or worsening depression or thoughts of suicide. Also watch out for sudden changes in feelings such as feeling anxious, agitated, panicky, irritable, hostile, aggressive, impulsive, severely restless, overly excited and hyperactive, or not being able to sleep. If this happens, especially at the beginning of treatment or after a change in dose, call your care team. Women should inform their care team if they wish to become pregnant or think they might be pregnant. There is a potential for serious side effects to an unborn child. Talk to your care team or pharmacist for more information. Women who become pregnant while using this medication may enroll in the [place] American Antiepileptic Drug Pregnancy Registry by calling 1-888-233-2334. This registry collects information about the safety of antiepileptic medication use during pregnancy. This medication may cause a decrease in folic acid and vitamin D. You should make sure that you get enough vitamins while you are taking this medication. Discuss the foods you eat and the vitamins you take with your care team. What side effects may I notice from receiving this medication? Side effects that you should report to your care team as soon as possible: Allergic reactions--skin rash, itching, hives, swelling of the face, lips, tongue, or throat High ammonia level--unusual weakness or fatigue, confusion, loss of appetite, nausea, vomiting, seizures Liver injury--right upper belly pain, loss of  appetite, nausea, light-colored stool, dark yellow or brown urine, yellowing skin or eyes, unusual weakness or fatigue Low body temperature, drowsiness, confusion Pancreatitis--severe stomach pain that spreads to your back or gets worse after eating or when touched, fever, nausea, vomiting Rash, fever, and swollen lymph nodes Thoughts of suicide or self-harm, worsening mood, feelings of depression Unusual bruising or bleeding Side effects that usually do not require medical attention (report to your care team if they continue or are bothersome): Change in vision Dizziness Drowsiness Hair loss Headache Nausea Tremors or shaking Weight gain This list may not describe all possible side effects. Call your doctor for medical advice about side effects. You may report side effects to FDA at 1-800-FDA-1088. Where should I keep my medication? Keep out of reach of children and pets. Store at room temperature between 15 and 30 degrees C (59 and 86 degrees F). Keep container tightly closed. Throw away any unused medication after the expiration date. NOTE: This sheet is a summary. It may not cover all possible information. If you have questions about this medicine, talk to your doctor, pharmacist, or health care provider.  2023 Elsevier/Gold Standard (2021-06-08 00:00:00)  

## 2022-07-17 NOTE — Progress Notes (Unsigned)
[place] MD OP Progress Note  07/17/2022 3:52 PM Jennifer [name]  MRN:  [id number]  Chief Complaint:  Chief Complaint  Patient presents with   Follow-up: 66 year old Caucasian female with history of bipolar disorder, GAD, insomnia, chronic pain with total psychosocial stressors, worsening mood symptoms was evaluated for medication management.   HPI: Jennifer [name] is a 66 year old Caucasian female, lives in [place], married, has a history of bipolar disorder, GAD, insomnia, neuroleptic induced dystonia, chronic pain was evaluated in office today.  Patient today reports she is currently struggling with multiple psychosocial stressors.  She continues to need to take care of her grandchildren who are 89 and 56 years old.  Patient reports her 36-year-old needs a lot of help since he seems to be having some behavioral problems likely has autism spectrum however needs to be evaluated for the same.  Patient reports her son who is the father of the children is not very helpful.  He continues to abuse substances.  Their mother is not involved.  Patient reports hence she believes it is all on her and she does not get much help from her husband either.  Patient reports she however continues to struggle with her own medical problems including chronic pain.  That does affect her mood as well.  Patient currently struggles with sadness, low energy, low motivation, anxiety, feeling nervous, worrying about things in general.  Patient also reports sleep is restless likely due to her pain.  She is currently on Lyrica as well as oxycodone for pain.  That does help to some extent.  No longer taking gabapentin.  Patient is compliant on the Prozac.  Denies side effects.  Patient denies any suicidality, homicidality or perceptual disturbances.  Denies any other concerns today.  Visit Diagnosis:    ICD-10-CM   1. Bipolar 1 disorder, depressed, mild (HCC)  F31.31 EKG 12-Lead    2. Generalized anxiety disorder  F41.1      3. Insomnia due to mental condition  F51.05    mood, pain      Past Psychiatric History: Reviewed past psychiatric history from progress note on 02/05/2018.  Past Medical History:  Past Medical History:  Diagnosis Date   Anemia 1985   bled during a cervical biopsy, led to bleed transfusion    Anginal pain ([place])    Anxiety    Arthritis    pt. reports that her neck is stiff    Bipolar depression ([place])    Blood dyscrasia    reported hx [name] '85; saw hematologist Dr. 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 Gar [facility]    PCP   CRD (chronic renal disease)    Depression    Depression with anxiety    Diabetes mellitus    Dyspnea    W/ EXERTION    Enlarged liver    Family history of adverse reaction to anesthesia    father woke up slowly   Fibromyalgia    Generalized anxiety disorder    GERD (gastroesophageal reflux disease)    Hyperlipidemia    Hypertension    Hypothyroidism    Low back pain    Low sodium levels    Migraines    Neuropathy  Neuropathy    Persistent disorder of initiating or maintaining sleep    Restless leg    Von Willebrand disease ([place])     Past Surgical History:  Procedure Laterality Date   ABDOMINAL HYSTERECTOMY     ANTERIOR LAT LUMBAR FUSION N/A 12/10/2016   Procedure: LUMBAR FOUR-FIVE  Anterior lateral lumbar interbody fusion with LUMBAR FOUR-FIVE  Posterolateral fusion/Re-operative laminectomy LUMBAR FIVE-SACRUM ONE Bilateral, Laminectomy at LUMBAR FOUR-FIVE , 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  and Left LUMBAR FOUR-FIVE  with Mazor;  Surgeon: Kevan Ny Ditty, MD;  Location: [facility];  Service: Neurosurgery;  Laterality: N/A;   APPLICATION OF ROBOTIC ASSISTANCE FOR SPINAL PROCEDURE N/A 12/10/2016    Procedure: APPLICATION OF ROBOTIC ASSISTANCE FOR SPINAL PROCEDURE;  Surgeon: Kevan Ny Ditty, MD;  Location: [facility];  Service: Neurosurgery;  Laterality: N/A;   BACK SURGERY  2007   lumbar   BREAST IMPLANT REMOVAL     CARDIAC CATHETERIZATION     CAD,PCI of LAD, Cypher stent 2.5-28mm   CARDIAC CATHETERIZATION     PCI of mid-LAD in stent restenosis with a drug-eluting stent   CARDIAC CATHETERIZATION  06/03/13   [facility];MID LAD 100 % STENOSIS; PRIOR STENT; MID RCA 40 % STENOSIS   COLONOSCOPY WITH PROPOFOL N/A 08/13/2018   Procedure: COLONOSCOPY WITH PROPOFOL;  Surgeon: [name], MD;  Location: [facility] ENDOSCOPY;  Service: Gastroenterology;  Laterality: N/A;   CORONARY ARTERY BYPASS GRAFT   08-12-2011   CABG x 3   CORONARY STENT PLACEMENT     ESOPHAGOGASTRODUODENOSCOPY (EGD) WITH PROPOFOL N/A 08/13/2018   Procedure: ESOPHAGOGASTRODUODENOSCOPY (EGD) WITH PROPOFOL;  Surgeon: [name], MD;  Location: [facility] ENDOSCOPY;  Service: Gastroenterology;  Laterality: N/A;   LUMBAR FUSION     L1-S1   LUMBAR PERCUTANEOUS PEDICLE SCREW 1 LEVEL N/A 12/10/2016   Procedure: Extension of pedicle screw fixation to LUMBAR FOUR;  Surgeon: Kevan Ny Ditty, MD;  Location: [facility];  Service: Neurosurgery;  Laterality: N/A;   NASAL SINUS SURGERY  1997   tuirbinate reduction    RECTAL EXAM UNDER ANESTHESIA N/A 10/06/2018   Procedure: RECTAL EXAM UNDER ANESTHESIA;  Surgeon: Jules Husbands, MD;  Location: [facility] ORS;  Service: General;  Laterality: N/A;    Family Psychiatric History: Reviewed family psychiatric history from progress note on 02/05/2018.  Family History:  Family History  Problem Relation Age of Onset   Aneurysm Father        abdominal   Diabetes Father    Alcohol abuse Father    Hypertension Father    Aneurysm Brother        abdominal   Alcohol abuse Brother    Hypertension Brother    Colon cancer Brother    Diabetes Mother    Alcohol abuse Mother    Hypertension Other        family hx    Hyperlipidemia Other        family hx   Diabetes Other        family hx    Social History: Reviewed social history from progress note on 02/05/2018. Social History   Socioeconomic History   Marital status: Married    Spouse name: Photographer   Number of children: 3   Years of education: Not on file   Highest education level: Associate degree: occupational, Hotel manager, or vocational program  Occupational History   Not on file  Tobacco Use   Smoking status: Former    Years: 16.00    Types: Cigarettes  Quit date: 06/20/2010    Years since quitting: 12.0   Smokeless tobacco: Never  Vaping Use   Vaping Use: Every day  Substance and Sexual Activity   Alcohol use: No    Alcohol/week: 0.0 standard drinks of alcohol   Drug use: No   Sexual activity: Yes  Other Topics Concern   Not on file  Social History Narrative   Not on file   Social Determinants of Health   Financial Resource Strain: Not on file  Food Insecurity: Not on file  Transportation Needs: No Transportation Needs (01/23/2018)   PRAPARE - Hydrologist (Medical): No    Lack of Transportation (Non-Medical): No  Physical Activity: Inactive (01/23/2018)   Exercise Vital Sign    Days of Exercise per Week: 0 days    Minutes of Exercise per Session: 0 min  Stress: Stress Concern Present (01/23/2018)   [place]    Feeling of Stress : Rather much  Social Connections: Unknown (01/23/2018)   Social Connection and Isolation Panel [NHANES]    Frequency of Communication with Friends and Family: Not on file    Frequency of Social Gatherings with Friends and Family: Not on file    Attends Religious Services: More than 4 times per year    Active Member of [employer] or Organizations: No    Attends Archivist Meetings: Never    Marital Status: Married    Allergies:  Allergies  Allergen Reactions   Aspirin Other (See Comments)     Metabolic Disorder Labs: Lab Results  Component Value Date   HGBA1C 7.2 (H) 01/01/2020   MPG 159.94 01/01/2020   MPG 223.08 12/05/2017   Lab Results  Component Value Date   PROLACTIN 6.1 12/05/2017   Lab Results  Component Value Date   CHOL 238 (H) 12/05/2017   TRIG 819 (H) 12/05/2017   HDL 41 12/05/2017   CHOLHDL 5.8 12/05/2017   VLDL UNABLE TO CALCULATE IF TRIGLYCERIDE OVER 400 mg/dL 12/05/2017   LDLCALC UNABLE TO CALCULATE IF TRIGLYCERIDE OVER 400 mg/dL 12/05/2017   LDLCALC SEE COMMENT 07/09/2012   Lab Results  Component Value Date   TSH 2.600 08/03/2018   TSH 86.000 (H) 12/05/2017    Therapeutic Level Labs: No results found for: "LITHIUM" No results found for: "VALPROATE" No results found for: "CBMZ"  Current Medications: Current Outpatient Medications  Medication Sig Dispense Refill   albuterol-ipratropium (COMBIVENT) 18-103 MCG/ACT inhaler Inhale 2 puffs into the lungs daily as needed for wheezing or shortness of breath.      amLODipine (NORVASC) 10 MG tablet Take 1 tablet (10 mg total) by mouth daily. 90 tablet 3   atorvastatin (LIPITOR) 80 MG tablet Take 1 tablet (80 mg total) by mouth daily. 90 tablet 3   carisoprodol (SOMA) 350 MG tablet Take 1 tablet (350 mg total) by mouth 2 (two) times daily as needed for muscle spasms.     carvedilol (COREG) 12.5 MG tablet Take 1 tablet (12.5 mg total) by mouth 2 (two) times daily with a meal. 180 tablet 3   Continuous Blood Gluc Receiver (FREESTYLE LIBRE 2 READER) DEVI Use 1 Device as directed     cycloSPORINE (RESTASIS) 0.05 % ophthalmic emulsion Apply to eye.     DETROL LA 4 MG 24 hr capsule Take 4 mg by mouth daily.     diclofenac Sodium (VOLTAREN) 1 % GEL SMARTSIG:2-4 Gram(s) Topical 4 Times Daily  doxepin (SINEQUAN) 10 MG capsule TAKE 1 TO 2 CAPSULES AT BEDTIME AS NEEDED (DOSE REDUCTION) 180 capsule 1   estradiol (ESTRING) 2 MG vaginal ring Place vaginally.     ezetimibe (ZETIA) 10 MG tablet Take 10 mg by  mouth daily.     FLUoxetine (PROZAC) 20 MG capsule Take 1 capsule (20 mg total) by mouth daily. 90 capsule 1   fluticasone (FLONASE) 50 MCG/ACT nasal spray Place 1 spray into both nostrils daily as needed.      glucose blood (FREESTYLE LITE) test strip 3 (three) times daily     HUMULIN 70/30 KWIKPEN (70-30) 100 UNIT/ML PEN Inject 10-15 Units into the skin See admin instructions. Inject 10-15 units SQ in the morning and inject 10-12 units SQ at bedtime per sliding scale     levothyroxine (SYNTHROID) 25 MCG tablet TAKE 1 TABLET ONCE DAILY TAKE ON AN EMPTY STOMACH WITH A GLASS OF WATER AT LEAST 30 TO 60 MINUTES BEFORE BREAKFAST     metFORMIN (GLUCOPHAGE-XR) 750 MG 24 hr tablet Take 750 mg by mouth in the morning and at bedtime.     naloxone (NARCAN) nasal spray 4 mg/0.1 mL SMARTSIG:Both Nares     nitroGLYCERIN (NITROSTAT) 0.4 MG SL tablet Place 1 tablet (0.4 mg total) under the tongue every 5 (five) minutes as needed for chest pain. 25 tablet 6   omeprazole (PRILOSEC) 20 MG capsule Take by mouth.     Oxycodone HCl 10 MG TABS LIMIT ONE HALF TO ONE TABLET BY MOUTH FOUR TO SIX TIMES PER DAY IF TOLERATED (NO HYDROCODONE ACETAMINOPHEN)     pregabalin (LYRICA) 75 MG capsule Take by mouth.     RESTASIS 0.05 % ophthalmic emulsion Place 1 drop into both eyes daily as needed (for dryness).     rOPINIRole (REQUIP) 0.5 MG tablet Take 1 mg by mouth at bedtime.      telmisartan (MICARDIS) 80 MG tablet Take 1 tablet (80 mg total) by mouth daily. 90 tablet 3   lidocaine (LIDODERM) 5 % SMARTSIG:Topical     sitaGLIPtin (JANUVIA) 100 MG tablet Take 100 mg by mouth daily.     SURE COMFORT PEN NEEDLES 31G X 8 MM MISC      No current facility-administered medications for this visit.     Musculoskeletal: Strength & Muscle Tone: within normal limits Gait & Station: normal Patient leans: N/A  Psychiatric Specialty Exam: Review of Systems  Musculoskeletal:  Positive for arthralgias and back pain.   Psychiatric/Behavioral:  Positive for dysphoric mood and sleep disturbance. The patient is nervous/anxious.   All other systems reviewed and are negative.   Blood pressure 95/63, pulse (!) 139, temperature 97.6 F (36.4 C), temperature source Temporal, weight 149 lb 3.2 oz (67.7 kg).Body mass index is 26.43 kg/m.  General Appearance: Casual  Eye Contact:  Fair  Speech:  Clear and Coherent  Volume:  Normal  Mood:  Anxious and Depressed  Affect:  Congruent  Thought Process:  Goal Directed and Descriptions of Associations: Intact  Orientation:  Full (Time, Place, and Person)  Thought Content: Logical   Suicidal Thoughts:  No  Homicidal Thoughts:  No  Memory:  Immediate;   Fair Recent;   Fair Remote;   Fair  Judgement:  Fair  Insight:  Fair  Psychomotor Activity:  Normal  Concentration:  Concentration: Fair and Attention Span: Fair  Recall:  [employer] of Knowledge: Fair  Language: Fair  Akathisia:  No  Handed:  Right  AIMS (if indicated): not  done  Assets:  Communication Skills Desire for [place] Talents/Skills  ADL's:  Intact  Cognition: WNL  Sleep:   Restless   Screenings: AIMS    Flowsheet Row Office Visit from 04/17/2022 in [place] Office Visit from 12/20/2021 in [place] Office Visit from 12/23/2018 in [place] Office Visit from 08/18/2018 in [facility] Total Score 0 '3 5 6      '$ [place] Office Visit from 07/17/2022 in [place] Office Visit from 11/15/2021 in [place] Video Visit from 04/10/2021 in [facility]  Total GAD-7 Score '10 4 16      '$ PHQ2-9    [place] Office Visit from 07/17/2022 in [facility] Office Visit from 04/17/2022 in [facility] Office Visit from 12/20/2021 in [facility] Office Visit from 11/15/2021 in [place] Video Visit from 04/10/2021 in [facility]  PHQ-2 Total Score '3 2 1 '$ 0 2  PHQ-9 Total Score '11 10 8 '$ -- 7      [place] Office Visit from 07/17/2022 in [facility] Office Visit from 04/17/2022 in [place] Office Visit from 12/20/2021 in [place] No Risk No Risk No Risk        Assessment and Plan: Jennifer [name] is a 66 year old Caucasian female who has a history of bipolar disorder, anxiety disorder was evaluated in office today.  Patient with multiple psychosocial stressors currently struggling with mood, sleep, comorbid pain does exacerbate her mood symptoms and sleep problems.  She will benefit from the following plan.  Plan Bipolar disorder depressed, mild-unstable We will consider adding a mood stabilizer like Depakote.  Patient provided information.  She will review it and will let writer know if she is interested. Discontinue gabapentin-no longer on it.  Patient is currently on Lyrica for pain which is also a mood stabilizer.  GAD-unstable Prozac 20 mg p.o. daily Patient encouraged to follow up with a therapist, however reports she is not interested.  Insomnia-unstable due to pain Doxepin 10 mg 1 to 2 capsules as needed at bedtime Continue pain management.  Patient with low blood pressure, tachycardia in session today-reports she has upcoming cardiology appointment coming up.  Patient to monitor and reach out to primary care provider as well as follow-up with cardiology.  Currently asymptomatic.  I have placed an order for EKG-patient to follow-up with cardiology for the same.  Follow-up in clinic in 4 to 6 weeks or sooner if needed.   This note was generated in part  or whole with voice recognition software. Voice recognition is usually quite accurate but there are transcription errors that can and very often do occur. I apologize for any typographical errors that were not detected and corrected.      Ursula Alert, MD 07/17/2022, 3:52 PM

## 2022-07-25 NOTE — Progress Notes (Signed)
This slightly lower lobe patient ID: Jennifer [name], female   DOB: [birth year], 66 y.o.   MRN: [id number] Cardiology Office Note  Date:  07/26/2022   [name], Jennifer [name] [birth year], MRN [id number]  PCP:  Valera Castle, MD   Chief Complaint  Patient presents with   12 month follow up     Patient c/o shortness of breath, chest discomfort, weakness, muscle and joint pain, right shoulder/arm pain, left arm and fingertips are numb at times. Medications reviewed by the patient verbally.     HPI:  66 year old woman with a history of  Medication noncompliance coronary artery disease, PTCA of the LAD with 2.5 x 28 mm Cypher stent in January 2007,  Repeat catheterization August 2011 with Stent placement of the mid LAD for 90% lesion,  catheterization August 06, 2011 for chest pain that showed severe mid LAD in-stent restenosis at the site of the previous drug-eluting stent ( DES stent x2 placed in the LAD in 2007 and 2011), also with severe mid RCA disease, moderate to severe proximal diagonal #1 disease, ejection fraction 55%. bypass surgery at [facility]  on August 12 2011 by Dr. Roxan Hockey. bypass graft x3 with a LIMA to the LAD, vein graft to the RCA, vein graft to the diagonal. Chronic opioid use She presents for routine follow-up of her coronary artery disease  Last seen in clinic by myself May 2022 Followed by pain clinic in [place], psychiatry Chronic pain Right arm pain/posterior shoulder, wrist  Active, spends time in her yard Non-smoker Reports compliance with her medications including Lipitor 80 daily and Zetia  Lab work reviewed End of 2022, A1c running 8.3 In January 2023 total cholesterol 277, presumably was not on medication at the time  Reports continued stress at home  EKG personally reviewed by myself on todays visit Normal sinus rhythm rate 71 bpm no significant ST-T wave changes  Past medical history reviewed, Previously reported chronic anxiety,  stress at home, confusion at times. Treated by a psychiatrist in [place], started on Valium.  She is tired all time, sleeping a good number of hours per day. In the past she has taken benzodiazepines,  Prozac.  previously on Lamictal and Cymbalta,  She sees Dr. Eliberto Ivory, also sees Dr. Leilani Merl in [place]. She does travel up to [place] to help family .     cardiac catheterization 06/03/2013. 100% occluded mid LAD at the site of a stent, mild mid RCA disease, saphenous vein graft to the diagonal and LIMA graft to the LAD are patent. Occluded saphenous vein graft to the distal RCA. Medical management was recommended.   PMH:   has a past medical history of Anemia (1985), Anginal pain ([place]), Anxiety, Arthritis, Bipolar depression ([place]), Blood dyscrasia, CAD (coronary artery disease), Cancer ([place]), COPD (chronic obstructive pulmonary disease) ([place]), CRD (chronic renal disease), Depression, Depression with anxiety, Diabetes mellitus, Dyspnea, Enlarged liver, Family history of adverse reaction to anesthesia, Fibromyalgia, Generalized anxiety disorder, GERD (gastroesophageal reflux disease), Hyperlipidemia, Hypertension, Hypothyroidism, Low back pain, Low sodium levels, Migraines, Neuropathy, Neuropathy, Persistent disorder of initiating or maintaining sleep, Restless leg, and Von Willebrand disease ([place]).  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  and Left LUMBAR FOUR-FIVE  with Mazor;  Surgeon: Kevan Ny Ditty, MD;  Location: [facility];  Service: Neurosurgery;  Laterality: N/A;   APPLICATION OF ROBOTIC ASSISTANCE FOR SPINAL PROCEDURE N/A 12/10/2016   Procedure:  APPLICATION OF ROBOTIC ASSISTANCE FOR SPINAL PROCEDURE;  Surgeon: Kevan Ny Ditty, MD;  Location: [facility];  Service: Neurosurgery;  Laterality: N/A;   BACK SURGERY  2007   lumbar   BREAST IMPLANT REMOVAL     CARDIAC CATHETERIZATION     CAD,PCI of LAD, Cypher stent 2.5-28mm   CARDIAC CATHETERIZATION     PCI of mid-LAD in stent restenosis with a drug-eluting stent   CARDIAC CATHETERIZATION  06/03/13   [facility];MID LAD 100 % STENOSIS; PRIOR STENT; MID RCA 40 % STENOSIS   COLONOSCOPY WITH PROPOFOL N/A 08/13/2018   Procedure: COLONOSCOPY WITH PROPOFOL;  Surgeon: [name], MD;  Location: [facility];  Service: Gastroenterology;  Laterality: N/A;   CORONARY ARTERY BYPASS GRAFT   08-12-2011   CABG x 3   CORONARY STENT PLACEMENT     ESOPHAGOGASTRODUODENOSCOPY (EGD) WITH PROPOFOL N/A 08/13/2018   Procedure: ESOPHAGOGASTRODUODENOSCOPY (EGD) WITH PROPOFOL;  Surgeon: [name], MD;  Location: [facility] ENDOSCOPY;  Service: Gastroenterology;  Laterality: N/A;   LUMBAR FUSION     L1-S1   LUMBAR PERCUTANEOUS PEDICLE SCREW 1 LEVEL N/A 12/10/2016   Procedure: Extension of pedicle screw fixation to LUMBAR FOUR;  Surgeon: Kevan Ny Ditty, MD;  Location: [facility];  Service: Neurosurgery;  Laterality: N/A;   NASAL SINUS SURGERY  1997   tuirbinate reduction    RECTAL EXAM UNDER ANESTHESIA N/A 10/06/2018   Procedure: RECTAL EXAM UNDER ANESTHESIA;  Surgeon: Jules Husbands, MD;  Location: [facility];  Service: General;  Laterality: N/A;    Current Outpatient Medications  Medication Sig Dispense Refill   albuterol-ipratropium (COMBIVENT) 18-103 MCG/ACT inhaler Inhale 2 puffs into the lungs daily as needed for wheezing or shortness of breath.      amLODipine (NORVASC) 10 MG tablet Take 1 tablet (10 mg total) by mouth daily. 90 tablet 3   atorvastatin (LIPITOR) 80 MG tablet Take 1 tablet (80 mg total) by mouth daily. 90 tablet 3   carisoprodol (SOMA) 350 MG tablet Take 1 tablet (350 mg total) by mouth 2 (two)  times daily as needed for muscle spasms.     carvedilol (COREG) 12.5 MG tablet Take 1 tablet (12.5 mg total) by mouth 2 (two) times daily with a meal. 180 tablet 3   Continuous Blood Gluc Receiver (FREESTYLE LIBRE 2 READER) DEVI Use 1 Device as directed     DETROL LA 4 MG 24 hr capsule Take 4 mg by mouth daily.     diclofenac Sodium (VOLTAREN) 1 % GEL SMARTSIG:2-4 Gram(s) Topical 4 Times Daily     doxepin (SINEQUAN) 10 MG capsule TAKE 1 TO 2 CAPSULES AT BEDTIME AS NEEDED (DOSE REDUCTION) 180 capsule 1   estradiol (ESTRING) 2 MG vaginal ring Place vaginally.     ezetimibe (ZETIA) 10 MG tablet Take 10 mg by mouth daily.     FLUoxetine (PROZAC) 20 MG capsule Take 1 capsule (20 mg total) by mouth daily. 90 capsule 1   fluticasone (FLONASE) 50 MCG/ACT nasal spray Place 1 spray into both nostrils daily as needed.      glucose blood (FREESTYLE LITE) test strip 3 (three) times daily     HUMULIN 70/30 KWIKPEN (70-30) 100 UNIT/ML PEN Inject 10-15 Units into the skin See admin instructions. Inject 10-15 units SQ in the morning  and inject 10-12 units SQ at bedtime per sliding scale     levothyroxine (SYNTHROID) 25 MCG tablet TAKE 1 TABLET ONCE DAILY TAKE ON AN EMPTY STOMACH WITH A GLASS OF WATER AT LEAST 30 TO 60 MINUTES BEFORE BREAKFAST     lidocaine (LIDODERM) 5 % SMARTSIG:Topical     metFORMIN (GLUCOPHAGE-XR) 750 MG 24 hr tablet Take 750 mg by mouth in the morning and at bedtime.     naloxone (NARCAN) nasal spray 4 mg/0.1 mL SMARTSIG:Both Nares     nitroGLYCERIN (NITROSTAT) 0.4 MG SL tablet Place 1 tablet (0.4 mg total) under the tongue every 5 (five) minutes as needed for chest pain. 25 tablet 6   omeprazole (PRILOSEC) 20 MG capsule Take by mouth.     Oxycodone HCl 10 MG TABS LIMIT ONE HALF TO ONE TABLET BY MOUTH FOUR TO SIX TIMES PER DAY IF TOLERATED (NO HYDROCODONE ACETAMINOPHEN)     pregabalin (LYRICA) 75 MG capsule Take by mouth.     RESTASIS 0.05 % ophthalmic emulsion Place 1 drop into both eyes  daily as needed (for dryness).     rOPINIRole (REQUIP) 0.5 MG tablet Take 1 mg by mouth at bedtime.      SURE COMFORT PEN NEEDLES 31G X 8 MM MISC      telmisartan (MICARDIS) 80 MG tablet Take 1 tablet (80 mg total) by mouth daily. 90 tablet 3   cycloSPORINE (RESTASIS) 0.05 % ophthalmic emulsion Apply to eye. (Patient not taking: Reported on 07/26/2022)     sitaGLIPtin (JANUVIA) 100 MG tablet Take 100 mg by mouth daily.     No current facility-administered medications for this visit.    Allergies:   Aspirin   Social History:  The patient  reports that she quit smoking about 12 years ago. Her smoking use included cigarettes. She has never used smokeless tobacco. She reports that she does not drink alcohol and does not use drugs.   Family History:   family history includes Alcohol abuse in her brother, father, and mother; Aneurysm in her brother and father; Colon cancer in her brother; Diabetes in her father, mother, and another family member; Hyperlipidemia in an other family member; Hypertension in her brother, father, and another family member.    Review of Systems: Review of Systems  Constitutional: Negative.   HENT: Negative.    Respiratory: Negative.    Cardiovascular: Negative.   Gastrointestinal: Negative.   Musculoskeletal: Negative.   Neurological: Negative.   Psychiatric/Behavioral: Negative.    All other systems reviewed and are negative.   PHYSICAL EXAM: VS:  BP 110/60 (BP Location: Left Arm, Patient Position: Sitting, Cuff Size: Normal)   Pulse 71   Ht '5\' 3"'$  (1.6 m)   Wt 151 lb 8 oz (68.7 kg)   SpO2 97%   BMI 26.84 kg/m  , BMI Body mass index is 26.84 kg/m.  Constitutional:  oriented to person, place, and time. No distress.  HENT:  Head: Grossly normal Eyes:  no discharge. No scleral icterus.  Neck: No JVD, no carotid bruits  Cardiovascular: Regular rate and rhythm, no murmurs appreciated Pulmonary/Chest: Clear to auscultation bilaterally, no wheezes or  rails Abdominal: Soft.  no distension.  no tenderness.  Musculoskeletal: Normal range of motion Neurological:  normal muscle tone. Coordination normal. No atrophy Skin: Skin warm and dry Psychiatric: normal affect, pleasant  Recent Labs: No results found for requested labs within last 365 days.    Lipid Panel Lab Results  Component Value Date   CHOL  238 (H) 12/05/2017   HDL 41 12/05/2017   LDLCALC UNABLE TO CALCULATE IF TRIGLYCERIDE OVER 400 mg/dL 12/05/2017   TRIG 819 (H) 12/05/2017      Wt Readings from Last 3 Encounters:  07/26/22 151 lb 8 oz (68.7 kg)  03/27/21 160 lb 8 oz (72.8 kg)  09/26/20 159 lb 8 oz (72.3 kg)     ASSESSMENT AND PLAN:  Coronary Artery disease with chronic stable Chronic chest pain symptoms often exacerbated by stress Low risk myoview 10/20 No further workup at this time. Continue current medication regimen.  Hyperlipidemia Continue Lipitor with Zetia daily We will order lipid panel today as she is n.p.o.  SMOKER Vaps, nonsmoker for years , since CABG  Type 2 diabetes mellitus with other circulatory complication ([place]) Poorly controlled diabetes secondary to medication non compliance A1c greater than 8 Recommend she follow closely with primary care  Depression/anxiety Long history of medication noncompliance Reports having stress at home, fighting with her husband Followed by psychiatry, pain clinic   Total encounter time more than 30 minutes  Greater than 50% was spent in counseling and coordination of care with the patient   No orders of the defined types were placed in this encounter.    Signed, Esmond Plants, M.D., Ph.D. 07/26/2022  [place], [place]

## 2022-07-26 ENCOUNTER — Other Ambulatory Visit
Admission: RE | Admit: 2022-07-26 | Discharge: 2022-07-26 | Disposition: A | Discharge status: Home / Self Care | Payer: Medicare Other | Source: Ambulatory Visit | Attending: Cardiovascular Disease | Admitting: Cardiovascular Disease

## 2022-07-26 ENCOUNTER — Encounter: Payer: Self-pay | Admitting: Cardiovascular Disease

## 2022-07-26 ENCOUNTER — Ambulatory Visit: Payer: Medicare Other | Attending: Cardiovascular Disease | Admitting: Cardiovascular Disease

## 2022-07-26 VITALS — BP 110/60 | HR 71 | Ht 63.0 in | Wt 151.5 lb

## 2022-07-26 DIAGNOSIS — I10 Essential (primary) hypertension: Secondary | ICD-10-CM | POA: Diagnosis not present

## 2022-07-26 DIAGNOSIS — I25118 Atherosclerotic heart disease of native coronary artery with other forms of angina pectoris: Secondary | ICD-10-CM | POA: Insufficient documentation

## 2022-07-26 DIAGNOSIS — E782 Mixed hyperlipidemia: Secondary | ICD-10-CM | POA: Diagnosis present

## 2022-07-26 DIAGNOSIS — F17200 Nicotine dependence, unspecified, uncomplicated: Secondary | ICD-10-CM | POA: Diagnosis present

## 2022-07-26 DIAGNOSIS — E785 Hyperlipidemia, unspecified: Secondary | ICD-10-CM | POA: Diagnosis present

## 2022-07-26 DIAGNOSIS — E1159 Type 2 diabetes mellitus with other circulatory complications: Secondary | ICD-10-CM | POA: Diagnosis not present

## 2022-07-26 DIAGNOSIS — E1169 Type 2 diabetes mellitus with other specified complication: Secondary | ICD-10-CM | POA: Diagnosis present

## 2022-07-26 DIAGNOSIS — I739 Peripheral vascular disease, unspecified: Secondary | ICD-10-CM | POA: Insufficient documentation

## 2022-07-26 LAB — LIPID PANEL
Cholesterol: 139 mg/dL (ref 0–200)
HDL: 38 mg/dL — ABNORMAL LOW (ref >40)
LDL Cholesterol: 54 mg/dL (ref 0–99)
Total CHOL/HDL Ratio: 3.7 RATIO
Triglycerides: 237 mg/dL — ABNORMAL HIGH (ref <150)
VLDL: 47 mg/dL — ABNORMAL HIGH (ref 0–40)

## 2022-07-26 NOTE — Patient Instructions (Addendum)
Medication Instructions:  No changes  If you need a refill on your cardiac medications before your next appointment, please call your pharmacy.   Lab work: Lipid panel in the hospital  Testing/Procedures: No new testing needed  Follow-Up: At [facility], you and your health needs are our priority.  As part of our continuing mission to provide you with exceptional heart care, we have created designated Provider Care Teams.  These Care Teams include your primary Cardiologist (physician) and Advanced Practice Providers (APPs -  Physician Assistants and Nurse Practitioners) who all work together to provide you with the care you need, when you need it.  You will need a follow up appointment in 12 months  Providers on your designated Care Team:   Murray Hodgkins, NP Christell Faith, PA-C Cadence Kathlen Mody, [place]  COVID-19 Vaccine Information can be found at: [email or web address] For questions related to vaccine distribution or appointments, please email [email or web address] or call [phone].

## 2022-09-11 ENCOUNTER — Ambulatory Visit (INDEPENDENT_AMBULATORY_CARE_PROVIDER_SITE_OTHER): Payer: Medicare Other | Admitting: Psychiatry

## 2022-09-11 ENCOUNTER — Encounter: Payer: Self-pay | Admitting: Psychiatry

## 2022-09-11 VITALS — BP 113/66 | HR 88 | Temp 99.0°F | Ht 63.0 in | Wt 157.0 lb

## 2022-09-11 DIAGNOSIS — Z79899 Other long term (current) drug therapy: Secondary | ICD-10-CM

## 2022-09-11 DIAGNOSIS — F5105 Insomnia due to other mental disorder: Secondary | ICD-10-CM | POA: Diagnosis not present

## 2022-09-11 DIAGNOSIS — I25118 Atherosclerotic heart disease of native coronary artery with other forms of angina pectoris: Secondary | ICD-10-CM | POA: Diagnosis not present

## 2022-09-11 DIAGNOSIS — F3131 Bipolar disorder, current episode depressed, mild: Secondary | ICD-10-CM | POA: Diagnosis not present

## 2022-09-11 DIAGNOSIS — F411 Generalized anxiety disorder: Secondary | ICD-10-CM

## 2022-09-11 MED ORDER — DIVALPROEX SODIUM ER 500 MG PO TB24: 500.0000 mg | ORAL_TABLET | Freq: Every day | ORAL | 0 refills | Status: DC

## 2022-09-11 MED ORDER — DOXEPIN HCL 10 MG PO CAPS: ORAL_CAPSULE | ORAL | 1 refills | Status: DC

## 2022-09-11 MED ORDER — FLUOXETINE HCL 20 MG PO CAPS: 20.0000 mg | ORAL_CAPSULE | Freq: Every day | ORAL | 1 refills | Status: DC

## 2022-09-11 NOTE — Patient Instructions (Signed)
Divalproex Sodium Delayed- or Extended-Release Tablets What is this medication? DIVALPROEX SODIUM (dye VAL pro ex SO dee um) prevents and controls seizures in people with epilepsy. It may also be used to prevent migraine headaches. It can also be used to treat bipolar disorder. It works by calming overactive nerves in your body. This medicine may be used for other purposes; ask your health care provider or pharmacist if you have questions. COMMON BRAND NAME(S): Depakote, Depakote ER What should I tell my care team before I take this medication? They need to know if you have any of these conditions: Frequently drink alcohol Kidney disease Liver disease Low platelet counts Mitochondrial disease Suicidal thoughts, plans, or attempt by you or a family member Urea cycle disorder (UCD) An unusual or allergic reaction to divalproex sodium, sodium valproate, valproic acid, other medications, foods, dyes, or preservatives Pregnant or trying to get pregnant Breast-feeding How should I use this medication? Take this medication by mouth with a drink of water. Follow the directions on the prescription label. Do not cut, crush or chew this medication. You can take it with or without food. If it upsets your stomach, take it with food. Take your medication at regular intervals. Do not take it more often than directed. Do not stop taking except on your care team's advice. A special MedGuide will be given to you by the pharmacist with each prescription and refill. Be sure to read this information carefully each time. Talk to your care team about the use of this medication in children. While this medication may be prescribed for children as young as 10 years for selected conditions, precautions do apply. Overdosage: If you think you have taken too much of this medicine contact a poison control center or emergency room at once. NOTE: This medicine is only for you. Do not share this medicine with others. What if I  miss a dose? If you miss a dose, take it as soon as you can. If it is almost time for your next dose, take only that dose. Do not take double or extra doses. What may interact with this medication? Do not take this medication with any of the following: Sodium phenylbutyrate This medication may also interact with the following: Aspirin Certain antibiotics, such as ertapenem, imipenem, meropenem Certain medications for depression, anxiety, or other mental health conditions Certain medications for seizures, such as cannabidiol, carbamazepine, clonazepam, diazepam, ethosuximide, felbamate, lamotrigine, phenobarbital, phenytoin, primidone, rufinamide, topiramate Certain medications that treat or prevent blood clots, such as warfarin Cholestyramine Estrogen and progestin hormones Methotrexate Propofol Rifampin Ritonavir Tolbutamide Zidovudine This list may not describe all possible interactions. Give your health care provider a list of all the medicines, herbs, non-prescription drugs, or dietary supplements you use. Also tell them if you smoke, drink alcohol, or use illegal drugs. Some items may interact with your medicine. What should I watch for while using this medication? Tell your care team if your symptoms do not get better or they start to get worse. This medication may cause serious skin reactions. They can happen weeks to months after starting the medication. Contact your care team right away if you notice fevers or flu-like symptoms with a rash. The rash may be red or purple and then turn into blisters or peeling of the skin. Or, you might notice a red rash with swelling of the face, lips or lymph nodes in your neck or under your arms. Wear a medical ID bracelet or chain, and carry a card that describes  your disease and details of your medication and dosage times. You may get drowsy, dizzy, or have blurred vision. Do not drive, use machinery, or do anything that needs mental alertness  until you know how this medication affects you. To reduce dizzy or fainting spells, do not sit or stand up quickly, especially if you are an older patient. Alcohol can increase drowsiness and dizziness. Avoid alcoholic drinks. This medication can make you more sensitive to the sun. Keep out of the sun. If you cannot avoid being in the sun, wear protective clothing and use sunscreen. Do not use sun lamps or tanning beds/booths. Patients and their families should watch out for new or worsening depression or thoughts of suicide. Also watch out for sudden changes in feelings such as feeling anxious, agitated, panicky, irritable, hostile, aggressive, impulsive, severely restless, overly excited and hyperactive, or not being able to sleep. If this happens, especially at the beginning of treatment or after a change in dose, call your care team. Women should inform their care team if they wish to become pregnant or think they might be pregnant. There is a potential for serious side effects to an unborn child. Talk to your care team or pharmacist for more information. Women who become pregnant while using this medication may enroll in the [place] Pregnancy Registry by calling [phone]. This registry collects information about the safety of antiepileptic medication use during pregnancy. This medication may cause a decrease in folic acid and vitamin D. You should make sure that you get enough vitamins while you are taking this medication. Discuss the foods you eat and the vitamins you take with your care team. What side effects may I notice from receiving this medication? Side effects that you should report to your care team as soon as possible: Allergic reactions--skin rash, itching, hives, swelling of the face, lips, tongue, or throat High ammonia level--unusual weakness or fatigue, confusion, loss of appetite, nausea, vomiting, seizures Liver injury--right upper belly pain, loss of  appetite, nausea, light-colored stool, dark yellow or brown urine, yellowing skin or eyes, unusual weakness or fatigue Low body temperature, drowsiness, confusion Pancreatitis--severe stomach pain that spreads to your back or gets worse after eating or when touched, fever, nausea, vomiting Rash, fever, and swollen lymph nodes Thoughts of suicide or self-harm, worsening mood, feelings of depression Unusual bruising or bleeding Side effects that usually do not require medical attention (report to your care team if they continue or are bothersome): Change in vision Dizziness Drowsiness Hair loss Headache Nausea Tremors or shaking Weight gain This list may not describe all possible side effects. Call your doctor for medical advice about side effects. You may report side effects to FDA at 1-800-FDA-1088. Where should I keep my medication? Keep out of reach of children and pets. Store at room temperature between 15 and 30 degrees C (59 and 86 degrees F). Keep container tightly closed. Throw away any unused medication after the expiration date. NOTE: This sheet is a summary. It may not cover all possible information. If you have questions about this medicine, talk to your doctor, pharmacist, or health care provider.  2023 Elsevier/Gold Standard (2021-06-08 00:00:00)

## 2022-09-11 NOTE — Progress Notes (Unsigned)
[facility] MD OP Progress Note  09/11/2022 2:08 PM Jennifer [name]  MRN:  [id number]  Chief Complaint:  Chief Complaint  Patient presents with   Follow-up   Medication Refill   Depression   HPI: Jennifer [name] is a 66 year old Caucasian female, lives in [place], married, has a history of bipolar disorder, GAD, insomnia, neuroleptic induced dystonia, chronic pain was evaluated in office today.  Patient today reports she continues to have multiple psychosocial stressors including relationship struggles with her son, his son who struggles with substance abuse problems, taking care of her grandchildren who needs a lot of support, her own pain, chronic.  Patient reports she hence continues to struggle with anxiety, sadness, low energy, mood swings, irritability, concentration problem.  Patient reports sleep also affected on and off mostly due to pain.  She does have doxepin which helps to some extent.  Patient currently tolerating the Prozac and the doxepin well.  Patient denies any suicidality, homicidality or perceptual disturbances.  Patient agreeable to trial of mood stabilizer, Depakote.  Denies any other concerns today.  Visit Diagnosis:    ICD-10-CM   1. Bipolar 1 disorder, depressed, mild (HCC)  F31.31 divalproex (DEPAKOTE ER) 500 MG 24 hr tablet    Platelet count    Valproic acid level    2. Generalized anxiety disorder  F41.1 divalproex (DEPAKOTE ER) 500 MG 24 hr tablet    FLUoxetine (PROZAC) 20 MG capsule    doxepin (SINEQUAN) 10 MG capsule    3. Insomnia due to mental condition  F51.05 divalproex (DEPAKOTE ER) 500 MG 24 hr tablet    4. High risk medication use  Z79.899 Platelet count    Valproic acid level      Past Psychiatric History: Reviewed past psychiatric history from progress note on 02/05/2018.  Past Medical History:  Past Medical History:  Diagnosis Date   Anemia 1985   bled during a cervical biopsy, led to bleed transfusion    Anginal pain ([place])     Anxiety    Arthritis    pt. reports that her neck is stiff    Bipolar depression ([place])    Blood dyscrasia    reported hx [name] '85; saw hematologist Dr. 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 Gar [facility]    PCP   CRD (chronic renal disease)    Depression    Depression with anxiety    Diabetes mellitus    Dyspnea    W/ EXERTION    Enlarged liver    Family history of adverse reaction to anesthesia    father woke up slowly   Fibromyalgia    Generalized anxiety disorder    GERD (gastroesophageal reflux disease)    Hyperlipidemia    Hypertension    Hypothyroidism    Low back pain    Low sodium levels    Migraines    Neuropathy    Neuropathy    Persistent disorder of initiating or maintaining sleep    Restless leg    Von Willebrand disease ([place])     Past Surgical History:  Procedure Laterality Date   ABDOMINAL HYSTERECTOMY     ANTERIOR LAT LUMBAR FUSION N/A 12/10/2016   Procedure: LUMBAR FOUR-FIVE  Anterior lateral lumbar interbody fusion with LUMBAR FOUR-FIVE  Posterolateral fusion/Re-operative laminectomy LUMBAR FIVE-SACRUM ONE Bilateral, Laminectomy at LUMBAR FOUR-FIVE , 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  and Left LUMBAR FOUR-FIVE  with Mazor;  Surgeon: Kevan Ny Ditty, MD;  Location: [facility];  Service: Neurosurgery;  Laterality: N/A;   APPLICATION OF ROBOTIC ASSISTANCE FOR SPINAL PROCEDURE N/A 12/10/2016   Procedure: APPLICATION OF ROBOTIC ASSISTANCE FOR SPINAL PROCEDURE;  Surgeon: Kevan Ny Ditty, MD;  Location: [facility];  Service: Neurosurgery;  Laterality: N/A;   BACK SURGERY  2007   lumbar   BREAST IMPLANT REMOVAL     CARDIAC CATHETERIZATION     CAD,PCI of LAD, Cypher stent 2.5-28mm   CARDIAC CATHETERIZATION     PCI of  mid-LAD in stent restenosis with a drug-eluting stent   CARDIAC CATHETERIZATION  06/03/13   [facility];MID LAD 100 % STENOSIS; PRIOR STENT; MID RCA 40 % STENOSIS   COLONOSCOPY WITH PROPOFOL N/A 08/13/2018   Procedure: COLONOSCOPY WITH PROPOFOL;  Surgeon: [name], MD;  Location: [facility] ENDOSCOPY;  Service: Gastroenterology;  Laterality: N/A;   CORONARY ARTERY BYPASS GRAFT   08-12-2011   CABG x 3   CORONARY STENT PLACEMENT     ESOPHAGOGASTRODUODENOSCOPY (EGD) WITH PROPOFOL N/A 08/13/2018   Procedure: ESOPHAGOGASTRODUODENOSCOPY (EGD) WITH PROPOFOL;  Surgeon: [name], MD;  Location: [facility] ENDOSCOPY;  Service: Gastroenterology;  Laterality: N/A;   LUMBAR FUSION     L1-S1   LUMBAR PERCUTANEOUS PEDICLE SCREW 1 LEVEL N/A 12/10/2016   Procedure: Extension of pedicle screw fixation to LUMBAR FOUR;  Surgeon: Kevan Ny Ditty, MD;  Location: [facility];  Service: Neurosurgery;  Laterality: N/A;   NASAL SINUS SURGERY  1997   tuirbinate reduction    RECTAL EXAM UNDER ANESTHESIA N/A 10/06/2018   Procedure: RECTAL EXAM UNDER ANESTHESIA;  Surgeon: Jules Husbands, MD;  Location: [facility] ORS;  Service: General;  Laterality: N/A;    Family Psychiatric History: Reviewed family psychiatric history from progress note on 02/05/2018.  Family History:  Family History  Problem Relation Age of Onset   Aneurysm Father        abdominal   Diabetes Father    Alcohol abuse Father    Hypertension Father    Aneurysm Brother        abdominal   Alcohol abuse Brother    Hypertension Brother    Colon cancer Brother    Diabetes Mother    Alcohol abuse Mother    Hypertension Other        family hx   Hyperlipidemia Other        family hx   Diabetes Other        family hx    Social History: Reviewed social history from progress note on 02/05/2018. Social History   Socioeconomic History   Marital status: Married    Spouse name: Photographer   Number of children: 3   Years of education: Not on file   Highest education  level: Associate degree: occupational, Hotel manager, or vocational program  Occupational History   Not on file  Tobacco Use   Smoking status: Former    Years: 16.00    Types: Cigarettes    Quit date: 06/20/2010    Years since quitting: 12.2   Smokeless tobacco: Never  Vaping Use   Vaping Use: Every day  Substance and Sexual Activity   Alcohol use: No    Alcohol/week: 0.0 standard drinks of alcohol   Drug use: No   Sexual activity: Yes  Other Topics Concern   Not on file  Social History Narrative   Not on file   Social Determinants of Health   Financial Resource Strain: Not on file  Food Insecurity: Not on file  Transportation Needs: No Transportation Needs (01/23/2018)   PRAPARE - Hydrologist (Medical): No    Lack of Transportation (Non-Medical): No  Physical Activity: Inactive (01/23/2018)   Exercise Vital Sign    Days of Exercise per Week: 0 days    Minutes of Exercise per Session: 0 min  Stress: Stress Concern Present (01/23/2018)   [place]    Feeling of Stress : Rather much  Social Connections: Unknown (01/23/2018)   Social Connection and Isolation Panel [NHANES]    Frequency of Communication with Friends and Family: Not on file    Frequency of Social Gatherings with Friends and Family: Not on file    Attends Religious Services: More than 4 times per year    Active Member of [employer] or Organizations: No    Attends Archivist Meetings: Never    Marital Status: Married    Allergies:  Allergies  Allergen Reactions   Aspirin Other (See Comments)    Metabolic Disorder Labs: Lab Results  Component Value Date   HGBA1C 7.2 (H) 01/01/2020   MPG 159.94 01/01/2020   MPG 223.08 12/05/2017   Lab Results  Component Value Date   PROLACTIN 6.1 12/05/2017   Lab Results  Component Value Date   CHOL 139 07/26/2022   TRIG 237 (H) 07/26/2022   HDL 38 (L) 07/26/2022    CHOLHDL 3.7 07/26/2022   VLDL 47 (H) 07/26/2022   LDLCALC 54 07/26/2022   LDLCALC UNABLE TO CALCULATE IF TRIGLYCERIDE OVER 400 mg/dL 12/05/2017   Lab Results  Component Value Date   TSH 2.600 08/03/2018   TSH 86.000 (H) 12/05/2017    Therapeutic Level Labs: No results found for: "LITHIUM" No results found for: "VALPROATE" No results found for: "CBMZ"  Current Medications: Current Outpatient Medications  Medication Sig Dispense Refill   albuterol-ipratropium (COMBIVENT) 18-103 MCG/ACT inhaler Inhale 2 puffs into the lungs daily as needed for wheezing or shortness of breath.      amLODipine (NORVASC) 10 MG tablet Take 1 tablet (10 mg total) by mouth daily. 90 tablet 3   atorvastatin (LIPITOR) 80 MG tablet Take 1 tablet (80 mg total) by mouth daily. 90 tablet 3   carisoprodol (SOMA) 350 MG tablet Take 1 tablet (350 mg total) by mouth 2 (two) times daily as needed for muscle spasms.     carvedilol (COREG) 12.5 MG tablet Take 1 tablet (12.5 mg total) by mouth 2 (two) times daily with a meal. 180 tablet 3   Continuous Blood Gluc Receiver (FREESTYLE LIBRE 2 READER) DEVI Use 1 Device as directed     cycloSPORINE (RESTASIS) 0.05 % ophthalmic emulsion Apply to eye.     DETROL LA 4 MG 24 hr capsule Take 4 mg by mouth daily.     diclofenac Sodium (VOLTAREN) 1 % GEL SMARTSIG:2-4 Gram(s) Topical 4 Times Daily     divalproex (DEPAKOTE ER) 500 MG 24 hr tablet Take 1 tablet (500 mg total) by mouth daily with supper. 90 tablet 0   estradiol (ESTRING) 2 MG vaginal ring Place vaginally.     ezetimibe (ZETIA) 10 MG tablet Take 10 mg by mouth daily.     fluticasone (FLONASE) 50 MCG/ACT nasal spray Place 1 spray into  both nostrils daily as needed.      glucose blood (FREESTYLE LITE) test strip 3 (three) times daily     HUMULIN 70/30 KWIKPEN (70-30) 100 UNIT/ML PEN Inject 10-15 Units into the skin See admin instructions. Inject 10-15 units SQ in the morning and inject 10-12 units SQ at bedtime per sliding  scale     levothyroxine (SYNTHROID) 25 MCG tablet TAKE 1 TABLET ONCE DAILY TAKE ON AN EMPTY STOMACH WITH A GLASS OF WATER AT LEAST 30 TO 60 MINUTES BEFORE BREAKFAST     lidocaine (LIDODERM) 5 % SMARTSIG:Topical     metFORMIN (GLUCOPHAGE-XR) 750 MG 24 hr tablet Take 750 mg by mouth in the morning and at bedtime.     naloxone (NARCAN) nasal spray 4 mg/0.1 mL SMARTSIG:Both Nares     nitroGLYCERIN (NITROSTAT) 0.4 MG SL tablet Place 1 tablet (0.4 mg total) under the tongue every 5 (five) minutes as needed for chest pain. 25 tablet 6   omeprazole (PRILOSEC) 20 MG capsule Take by mouth.     Oxycodone HCl 10 MG TABS LIMIT ONE HALF TO ONE TABLET BY MOUTH FOUR TO SIX TIMES PER DAY IF TOLERATED (NO HYDROCODONE ACETAMINOPHEN)     pregabalin (LYRICA) 75 MG capsule Take by mouth.     RESTASIS 0.05 % ophthalmic emulsion Place 1 drop into both eyes daily as needed (for dryness).     rOPINIRole (REQUIP) 0.5 MG tablet Take 1 mg by mouth at bedtime.      SURE COMFORT PEN NEEDLES 31G X 8 MM MISC      telmisartan (MICARDIS) 80 MG tablet Take 1 tablet (80 mg total) by mouth daily. 90 tablet 3   doxepin (SINEQUAN) 10 MG capsule TAKE 1 TO 2 CAPSULES AT BEDTIME AS NEEDED (DOSE REDUCTION) 180 capsule 1   FLUoxetine (PROZAC) 20 MG capsule Take 1 capsule (20 mg total) by mouth daily. 90 capsule 1   sitaGLIPtin (JANUVIA) 100 MG tablet Take 100 mg by mouth daily.     No current facility-administered medications for this visit.     Musculoskeletal: Strength & Muscle Tone: within normal limits Gait & Station: normal Patient leans: N/A  Psychiatric Specialty Exam: Review of Systems  Musculoskeletal:  Positive for arthralgias and back pain.       Left shoulder pain, neck pain-chronic  Psychiatric/Behavioral:  Positive for decreased concentration, dysphoric mood and sleep disturbance. The patient is nervous/anxious.   All other systems reviewed and are negative.   Blood pressure (!) 147/86, pulse 86, temperature 99 F  (37.2 C), temperature source Oral, height '5\' 3"'$  (1.6 m), weight 157 lb (71.2 kg).Body mass index is 27.81 kg/m.  General Appearance: Casual  Eye Contact:  Fair  Speech:  Normal Rate  Volume:  Normal  Mood:  Anxious, Depressed, and Irritable  Affect:  Congruent  Thought Process:  Goal Directed and Descriptions of Associations: Intact  Orientation:  Full (Time, Place, and Person)  Thought Content: Logical   Suicidal Thoughts:  No  Homicidal Thoughts:  No  Memory:  Immediate;   Fair Recent;   Fair Remote;   Fair  Judgement:  Fair  Insight:  Fair  Psychomotor Activity:  Normal  Concentration:  Concentration: Fair and Attention Span: Fair  Recall:  [employer] of Knowledge: Fair  Language: Fair  Akathisia:  No  Handed:  Right  AIMS (if indicated): not done  Assets:  Communication Skills Desire for Improvement Housing Intimacy Social Support  ADL's:  Intact  Cognition: WNL  Sleep:   Restless due to pain   Screenings: [facility] Office Visit from 04/17/2022 in [facility] [place] Office Visit from 12/20/2021 in [facility] Office Visit from 12/23/2018 in [place] [place] Office Visit from 08/18/2018 in [facility] Total Score 0 '3 5 6      '$ [place] Office Visit from 07/17/2022 in [facility] Office Visit from 11/15/2021 in [place] Video Visit from 04/10/2021 in [place]  Total GAD-7 Score '10 4 16      '$ PHQ2-9    [place] Visit from 07/17/2022 in [facility] Office Visit from 04/17/2022 in [facility] Office Visit from 12/20/2021 in [facility] Office Visit from 11/15/2021 in [place] Video Visit from 04/10/2021 in [facility]  PHQ-2 Total Score '3 2 1 '$ 0 2  PHQ-9 Total Score '11 10 8 '$ -- [place] Visit from 09/11/2022 in [facility] Office Visit from 07/17/2022 in [place] Office Visit from 04/17/2022 in [facility] No Risk No Risk No Risk        Assessment and Plan: Jennifer [name] is a 66 year old Caucasian female who has a history of bipolar disorder, anxiety disorder was evaluated in office today.  Patient with chronic pain, multiple medical problems, psychosocial stresses continues to struggle with mood, will benefit from the following changes.  Plan Bipolar disorder depressed mild-unstable Will add Depakote ER 500 mg p.o. daily with supper Continue Lyrica for pain which is also a mood stabilizer.   GAD-unstable Added Depakote as noted above Prozac 20 mg p.o. daily Patient offered to be referred to therapist, declines.  Insomnia-unstable due to pain Doxepin 10 mg 1 to 2 capsules at bedtime. Continue pain management.  High risk medication use-will order Depakote level, platelet count.  Patient to go to [facility] lab 5 days after starting the medication.  Reviewed most recent CMP 07/04/2022-sodium 139, AST ALT-within normal limits.  Will repeat CMP as needed.  Patient with elevated blood pressure reading, advised to follow up with primary care provider.  Follow-up in clinic in 4 to 5 weeks or sooner if needed. This note was generated in part or whole with voice recognition software. Voice recognition is usually quite accurate but there are transcription errors that can and very often do occur. I apologize for any typographical errors that were not detected and corrected.      Ursula Alert, MD 09/11/2022, 2:08 PM

## 2022-10-24 ENCOUNTER — Other Ambulatory Visit: Payer: Self-pay | Admitting: Orthopedic Surgery

## 2022-10-24 DIAGNOSIS — M75101 Unspecified rotator cuff tear or rupture of right shoulder, not specified as traumatic: Secondary | ICD-10-CM

## 2022-11-02 ENCOUNTER — Ambulatory Visit
Admission: RE | Admit: 2022-11-02 | Discharge: 2022-11-02 | Disposition: A | Discharge status: Home / Self Care | Payer: Medicare Other | Source: Ambulatory Visit | Attending: Orthopedic Surgery | Admitting: Orthopedic Surgery

## 2022-11-02 DIAGNOSIS — M75101 Unspecified rotator cuff tear or rupture of right shoulder, not specified as traumatic: Secondary | ICD-10-CM | POA: Insufficient documentation

## 2022-11-13 ENCOUNTER — Encounter: Payer: Self-pay | Admitting: Psychiatry

## 2022-11-13 ENCOUNTER — Ambulatory Visit (INDEPENDENT_AMBULATORY_CARE_PROVIDER_SITE_OTHER): Payer: Medicare Other | Admitting: Psychiatry

## 2022-11-13 ENCOUNTER — Other Ambulatory Visit
Admission: RE | Admit: 2022-11-13 | Discharge: 2022-11-13 | Disposition: A | Discharge status: Home / Self Care | Payer: Medicare Other | Source: Ambulatory Visit | Attending: Psychiatry | Admitting: Psychiatry

## 2022-11-13 VITALS — BP 126/76 | HR 80 | Temp 97.9°F | Ht 63.0 in | Wt 162.6 lb

## 2022-11-13 DIAGNOSIS — F411 Generalized anxiety disorder: Secondary | ICD-10-CM

## 2022-11-13 DIAGNOSIS — Z79899 Other long term (current) drug therapy: Secondary | ICD-10-CM

## 2022-11-13 DIAGNOSIS — F5105 Insomnia due to other mental disorder: Secondary | ICD-10-CM | POA: Diagnosis not present

## 2022-11-13 DIAGNOSIS — F3131 Bipolar disorder, current episode depressed, mild: Secondary | ICD-10-CM

## 2022-11-13 LAB — PLATELET COUNT: Platelets: 286 10*3/uL (ref 150–400)

## 2022-11-13 LAB — VALPROIC ACID LEVEL: Valproic Acid Lvl: 25 ug/mL — ABNORMAL LOW (ref 50.0–100.0)

## 2022-11-13 NOTE — Progress Notes (Signed)
[place] MD OP Progress Note  11/13/2022 1:28 PM Jennifer [name]  MRN:  [id number]  Chief Complaint:  Chief Complaint  Patient presents with   Follow-up   Medication Refill   Anxiety   Depression   HPI: Jennifer [name] is a 67 year old Caucasian female, lives in [place], married, has a history of bipolar disorder, GAD, insomnia, neuroleptic induced dystonia, chronic pain was evaluated in office today.  Patient today reports she is currently compliant on the Depakote.  She does not know if the Depakote is helpful however denies any side effects.  Reports she continues to be in pain, was diagnosed with rotator cuff tear of her right shoulder.  She has upcoming appointment with a surgeon end of January.  That does have an effect on her mood.  Patient also reports that her dog who is 29 years old is struggling with health issues and she has been waking up in the middle of the night to stay with her dog.  That does have an impact on her sleep and mood.  Patient however reports she otherwise stays busy taking care of her grandchildren.  Her son continues to have problems with substance abuse on and off.  That is a chronic and constant stressor for her.  She however is not interested in psychotherapy sessions, does not believe she has the time for that.  Patient reports she is compliant on medications.  Denies side effects.  Denies any suicidality, homicidality or perceptual disturbances.  Patient appeared to be alert, oriented to person place time and situation.  Patient denies any other concerns today.    Visit Diagnosis:    ICD-10-CM   1. Bipolar 1 disorder, depressed, mild ([place])  F31.31     2. Generalized anxiety disorder  F41.1     3. Insomnia due to mental condition  F51.05    Mood, pain    4. High risk medication use  Z79.899       Past Psychiatric History: Reviewed past psychiatric history from progress note on 02/05/2018.  Past Medical History:  Past Medical History:   Diagnosis Date   Anemia 1985   bled during a cervical biopsy, led to bleed transfusion    Anginal pain ([place])    Anxiety    Arthritis    pt. reports that her neck is stiff    Bipolar depression ([place])    Blood dyscrasia    reported hx [name] '85; saw hematologist Dr. 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 Gar [facility]    PCP   CRD (chronic renal disease)    Depression    Depression with anxiety    Diabetes mellitus    Dyspnea    W/ EXERTION    Enlarged liver    Family history of adverse reaction to anesthesia    father woke up slowly   Fibromyalgia    Generalized anxiety disorder    GERD (gastroesophageal reflux disease)    Hyperlipidemia    Hypertension    Hypothyroidism    Low back pain    Low sodium levels    Migraines    Neuropathy    Neuropathy    Persistent disorder of initiating or maintaining sleep  Restless leg    Von Willebrand disease ([place])     Past Surgical History:  Procedure Laterality Date   ABDOMINAL HYSTERECTOMY     ANTERIOR LAT LUMBAR FUSION N/A 12/10/2016   Procedure: LUMBAR FOUR-FIVE  Anterior lateral lumbar interbody fusion with LUMBAR FOUR-FIVE  Posterolateral fusion/Re-operative laminectomy LUMBAR FIVE-SACRUM ONE Bilateral, Laminectomy at LUMBAR FOUR-FIVE , 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  and Left LUMBAR FOUR-FIVE  with Mazor;  Surgeon: Kevan Ny Ditty, MD;  Location: [facility];  Service: Neurosurgery;  Laterality: N/A;   APPLICATION OF ROBOTIC ASSISTANCE FOR SPINAL PROCEDURE N/A 12/10/2016   Procedure: APPLICATION OF ROBOTIC ASSISTANCE FOR SPINAL PROCEDURE;  Surgeon: Kevan Ny Ditty, MD;  Location: [facility];  Service: Neurosurgery;  Laterality: N/A;   BACK SURGERY  2007   lumbar   BREAST IMPLANT  REMOVAL     CARDIAC CATHETERIZATION     CAD,PCI of LAD, Cypher stent 2.5-28mm   CARDIAC CATHETERIZATION     PCI of mid-LAD in stent restenosis with a drug-eluting stent   CARDIAC CATHETERIZATION  06/03/13   [facility];MID LAD 100 % STENOSIS; PRIOR STENT; MID RCA 40 % STENOSIS   COLONOSCOPY WITH PROPOFOL N/A 08/13/2018   Procedure: COLONOSCOPY WITH PROPOFOL;  Surgeon: [name], MD;  Location: [facility] ENDOSCOPY;  Service: Gastroenterology;  Laterality: N/A;   CORONARY ARTERY BYPASS GRAFT   08-12-2011   CABG x 3   CORONARY STENT PLACEMENT     ESOPHAGOGASTRODUODENOSCOPY (EGD) WITH PROPOFOL N/A 08/13/2018   Procedure: ESOPHAGOGASTRODUODENOSCOPY (EGD) WITH PROPOFOL;  Surgeon: [name], MD;  Location: [facility] ENDOSCOPY;  Service: Gastroenterology;  Laterality: N/A;   LUMBAR FUSION     L1-S1   LUMBAR PERCUTANEOUS PEDICLE SCREW 1 LEVEL N/A 12/10/2016   Procedure: Extension of pedicle screw fixation to LUMBAR FOUR;  Surgeon: Kevan Ny Ditty, MD;  Location: [facility];  Service: Neurosurgery;  Laterality: N/A;   NASAL SINUS SURGERY  1997   tuirbinate reduction    RECTAL EXAM UNDER ANESTHESIA N/A 10/06/2018   Procedure: RECTAL EXAM UNDER ANESTHESIA;  Surgeon: Jules Husbands, MD;  Location: [facility] ORS;  Service: General;  Laterality: N/A;    Family Psychiatric History: Reviewed family psychiatric history from progress note on 02/05/2018.  Family History:  Family History  Problem Relation Age of Onset   Aneurysm Father        abdominal   Diabetes Father    Alcohol abuse Father    Hypertension Father    Aneurysm Brother        abdominal   Alcohol abuse Brother    Hypertension Brother    Colon cancer Brother    Diabetes Mother    Alcohol abuse Mother    Hypertension Other        family hx   Hyperlipidemia Other        family hx   Diabetes Other        family hx    Social History: Reviewed social history from progress note on 02/05/2018. Social History   Socioeconomic History   Marital status:  Married    Spouse name: Photographer   Number of children: 3   Years of education: Not on file   Highest education level: Associate degree: occupational, Hotel manager, or vocational program  Occupational History   Not on file  Tobacco Use   Smoking status: Former    Years: 16.00    Types: Cigarettes    Quit date: 06/20/2010    Years since quitting: 12.4   Smokeless  tobacco: Never  Vaping Use   Vaping Use: Every day  Substance and Sexual Activity   Alcohol use: No    Alcohol/week: 0.0 standard drinks of alcohol   Drug use: No   Sexual activity: Yes  Other Topics Concern   Not on file  Social History Narrative   Not on file   Social Determinants of Health   Financial Resource Strain: Not on file  Food Insecurity: Not on file  Transportation Needs: No Transportation Needs (01/23/2018)   PRAPARE - Hydrologist (Medical): No    Lack of Transportation (Non-Medical): No  Physical Activity: Inactive (01/23/2018)   Exercise Vital Sign    Days of Exercise per Week: 0 days    Minutes of Exercise per Session: 0 min  Stress: Stress Concern Present (01/23/2018)   [place]    Feeling of Stress : Rather much  Social Connections: Unknown (01/23/2018)   Social Connection and Isolation Panel [NHANES]    Frequency of Communication with Friends and Family: Not on file    Frequency of Social Gatherings with Friends and Family: Not on file    Attends Religious Services: More than 4 times per year    Active Member of [employer] or Organizations: No    Attends Archivist Meetings: Never    Marital Status: Married    Allergies:  Allergies  Allergen Reactions   Aspirin Other (See Comments)    Metabolic Disorder Labs: Lab Results  Component Value Date   HGBA1C 7.2 (H) 01/01/2020   MPG 159.94 01/01/2020   MPG 223.08 12/05/2017   Lab Results  Component Value Date   PROLACTIN 6.1 12/05/2017    Lab Results  Component Value Date   CHOL 139 07/26/2022   TRIG 237 (H) 07/26/2022   HDL 38 (L) 07/26/2022   CHOLHDL 3.7 07/26/2022   VLDL 47 (H) 07/26/2022   LDLCALC 54 07/26/2022   LDLCALC UNABLE TO CALCULATE IF TRIGLYCERIDE OVER 400 mg/dL 12/05/2017   Lab Results  Component Value Date   TSH 2.600 08/03/2018   TSH 86.000 (H) 12/05/2017    Therapeutic Level Labs: No results found for: "LITHIUM" Lab Results  Component Value Date   VALPROATE 25 (L) 11/13/2022   No results found for: "CBMZ"  Current Medications: Current Outpatient Medications  Medication Sig Dispense Refill   albuterol-ipratropium (COMBIVENT) 18-103 MCG/ACT inhaler Inhale 2 puffs into the lungs daily as needed for wheezing or shortness of breath.      amLODipine (NORVASC) 10 MG tablet Take 1 tablet (10 mg total) by mouth daily. 90 tablet 3   atorvastatin (LIPITOR) 80 MG tablet Take 1 tablet (80 mg total) by mouth daily. 90 tablet 3   carisoprodol (SOMA) 350 MG tablet Take 1 tablet (350 mg total) by mouth 2 (two) times daily as needed for muscle spasms.     carvedilol (COREG) 12.5 MG tablet Take 1 tablet (12.5 mg total) by mouth 2 (two) times daily with a meal. 180 tablet 3   Continuous Blood Gluc Receiver (FREESTYLE LIBRE 2 READER) DEVI Use 1 Device as directed     DETROL LA 4 MG 24 hr capsule Take 4 mg by mouth daily.     diclofenac Sodium (VOLTAREN) 1 % GEL SMARTSIG:2-4 Gram(s) Topical 4 Times Daily     divalproex (DEPAKOTE ER) 500 MG 24 hr tablet Take 1 tablet (500 mg total) by mouth daily with supper. 90 tablet 0  doxepin (SINEQUAN) 10 MG capsule TAKE 1 TO 2 CAPSULES AT BEDTIME AS NEEDED (DOSE REDUCTION) 180 capsule 1   estradiol (ESTRING) 2 MG vaginal ring Place vaginally.     ezetimibe (ZETIA) 10 MG tablet Take 10 mg by mouth daily.     FLUoxetine (PROZAC) 20 MG capsule Take 1 capsule (20 mg total) by mouth daily. 90 capsule 1   fluticasone (FLONASE) 50 MCG/ACT nasal spray Place 1 spray into both  nostrils daily as needed.      glucose blood (FREESTYLE LITE) test strip 3 (three) times daily     HUMULIN 70/30 KWIKPEN (70-30) 100 UNIT/ML PEN Inject 10-15 Units into the skin See admin instructions. Inject 10-15 units SQ in the morning and inject 10-12 units SQ at bedtime per sliding scale     levothyroxine (SYNTHROID) 25 MCG tablet TAKE 1 TABLET ONCE DAILY TAKE ON AN EMPTY STOMACH WITH A GLASS OF WATER AT LEAST 30 TO 60 MINUTES BEFORE BREAKFAST     lidocaine (LIDODERM) 5 % SMARTSIG:Topical     metFORMIN (GLUCOPHAGE-XR) 750 MG 24 hr tablet Take 750 mg by mouth in the morning and at bedtime.     naloxone (NARCAN) nasal spray 4 mg/0.1 mL SMARTSIG:Both Nares     nitroGLYCERIN (NITROSTAT) 0.4 MG SL tablet Place 1 tablet (0.4 mg total) under the tongue every 5 (five) minutes as needed for chest pain. 25 tablet 6   omeprazole (PRILOSEC) 20 MG capsule Take by mouth.     Oxycodone HCl 10 MG TABS LIMIT ONE HALF TO ONE TABLET BY MOUTH FOUR TO SIX TIMES PER DAY IF TOLERATED (NO HYDROCODONE ACETAMINOPHEN)     pregabalin (LYRICA) 75 MG capsule Take by mouth.     RESTASIS 0.05 % ophthalmic emulsion Place 1 drop into both eyes daily as needed (for dryness).     rOPINIRole (REQUIP) 0.5 MG tablet Take 1 mg by mouth at bedtime.      sitaGLIPtin (JANUVIA) 100 MG tablet Take 100 mg by mouth daily.     SURE COMFORT PEN NEEDLES 31G X 8 MM MISC      telmisartan (MICARDIS) 80 MG tablet Take 1 tablet (80 mg total) by mouth daily. 90 tablet 3   No current facility-administered medications for this visit.     Musculoskeletal: Strength & Muscle Tone: within normal limits Gait & Station: normal Patient leans: N/A  Psychiatric Specialty Exam: Review of Systems  Musculoskeletal:  Positive for arthralgias and back pain.       Right-sided shoulder joint pain  Psychiatric/Behavioral:  Positive for dysphoric mood and sleep disturbance. The patient is nervous/anxious.   All other systems reviewed and are negative.    Blood pressure 126/76, pulse 80, temperature 97.9 F (36.6 C), temperature source Oral, height '5\' 3"'$  (1.6 m), weight 162 lb 9.6 oz (73.8 kg).Body mass index is 28.8 kg/m.  General Appearance: Casual  Eye Contact:  Fair  Speech:  Clear and Coherent  Volume:  Normal  Mood:  Anxious and Depressed  Affect:  Congruent  Thought Process:  Goal Directed and Descriptions of Associations: Intact  Orientation:  Full (Time, Place, and Person)  Thought Content: Logical   Suicidal Thoughts:  No  Homicidal Thoughts:  No  Memory:  Immediate;   Fair Recent;   Fair Remote;   Fair  Judgement:  Fair  Insight:  Fair  Psychomotor Activity:  Normal  Concentration:  Concentration: Fair and Attention Span: Fair  Recall:  [employer] of Knowledge: Fair  Language:  Fair  Akathisia:  No  Handed:  Right  AIMS (if indicated): done  Assets:  Communication Skills Desire for Improvement Housing Social Support  ADL's:  Intact  Cognition: WNL  Sleep:   Restless due to her dog needing help at night as well as pain   Screenings: [place] Office Visit from 04/17/2022 in [facility] Office Visit from 12/20/2021 in [facility] Visit from 12/23/2018 in [facility] Visit from 08/18/2018 in [facility] Total Score 0 '3 5 6      '$ [facility] Office Visit from 11/13/2022 in [facility] Office Visit from 09/11/2022 in [facility] [facility] Office Visit from 07/17/2022 in [facility] Office Visit from 11/15/2021 in [facility] Video Visit from 04/10/2021 in [facility]  Total GAD-7 Score '5 14 10 4 16      '$ PHQ2-9    [place] Visit from 11/13/2022 in [facility] Visit from 09/11/2022 in  [place] Visit from 07/17/2022 in [facility] Visit from 04/17/2022 in [place] Visit from 12/20/2021 in [place] [facility]  PHQ-2 Total Score '3 3 3 2 1  '$ PHQ-9 Total Score '10 13 11 10 8      '$ [place] Visit from 11/13/2022 in [place] Office Visit from 09/11/2022 in [facility] Office Visit from 07/17/2022 in [facility] No Risk No Risk No Risk        Assessment and Plan: ANDRAYA FRIGON is a 67 year old Caucasian female who has a history of bipolar disorder, anxiety disorder was evaluated in office today.  Patient currently noncompliant with Depakote labs however reports continued mood symptoms, sleep problems, comorbid situational stressors as well as chronic pain, will benefit from the following plan.  Plan Bipolar disorder depressed mild-unstable Will consider increasing Depakote ER however will need levels done patient has been noncompliant. Continue Depakote ER 500 mg p.o. daily with supper for now. Continue Lyrica for pain which is also a mood stabilizer.  GAD-some improvement Prozac 20 mg p.o. daily Patient offered therapy-declines Continue Depakote ER 500 mg p.o. daily, will consider increasing dosage  Insomnia-restless due to pain as well as her dog needing help Doxepin 10 to 20 mg at bedtime as needed Patient to work on sleep hygiene techniques Continue pain management  High risk medication use-patient advised to go to [facility] lab-Depakote level, platelet count ordered in the system. Once levels are reviewed, will consider increasing the dosage of Depakote.  Follow-up in clinic in 6 weeks or sooner if needed.   This note was generated in part or whole with voice recognition software. Voice recognition is usually quite  accurate but there are transcription errors that can and very often do occur. I apologize for any typographical errors that were not detected and corrected.     Ursula Alert, MD 11/14/2022, 8:48 AM

## 2022-11-15 ENCOUNTER — Telehealth: Payer: Self-pay | Admitting: Psychiatry

## 2022-11-15 DIAGNOSIS — F5105 Insomnia due to other mental disorder: Secondary | ICD-10-CM

## 2022-11-15 DIAGNOSIS — F3131 Bipolar disorder, current episode depressed, mild: Secondary | ICD-10-CM

## 2022-11-15 DIAGNOSIS — Z79899 Other long term (current) drug therapy: Secondary | ICD-10-CM

## 2022-11-15 DIAGNOSIS — F411 Generalized anxiety disorder: Secondary | ICD-10-CM

## 2022-11-15 MED ORDER — DIVALPROEX SODIUM ER 250 MG PO TB24: 250.0000 mg | ORAL_TABLET | Freq: Every day | ORAL | 0 refills | Status: DC

## 2022-11-15 MED ORDER — DIVALPROEX SODIUM ER 500 MG PO TB24: 500.0000 mg | ORAL_TABLET | Freq: Every day | ORAL | 0 refills | Status: DC

## 2022-11-15 NOTE — Telephone Encounter (Signed)
Contacted patient to discuss Depakote level-subtherapeutic, discussed going up on the Depakote to 750 mg.  Will order Depakote level, platelet count, sodium, LFT-patient to go to [facility] lab after a week of increasing the dosage in the a.m.

## 2022-11-22 DIAGNOSIS — M75111 Incomplete rotator cuff tear or rupture of right shoulder, not specified as traumatic: Secondary | ICD-10-CM | POA: Insufficient documentation

## 2022-11-22 DIAGNOSIS — M7521 Bicipital tendinitis, right shoulder: Secondary | ICD-10-CM | POA: Insufficient documentation

## 2022-11-22 DIAGNOSIS — M7581 Other shoulder lesions, right shoulder: Secondary | ICD-10-CM | POA: Insufficient documentation

## 2022-12-09 ENCOUNTER — Other Ambulatory Visit: Payer: Self-pay | Admitting: Surgery

## 2022-12-12 ENCOUNTER — Other Ambulatory Visit: Payer: Self-pay

## 2022-12-12 ENCOUNTER — Encounter
Admission: RE | Admit: 2022-12-12 | Discharge: 2022-12-12 | Disposition: A | Discharge status: Home / Self Care | Payer: Medicare Other | Source: Ambulatory Visit | Attending: Surgery | Admitting: Surgery

## 2022-12-12 ENCOUNTER — Telehealth: Payer: Self-pay | Admitting: *Deleted

## 2022-12-12 DIAGNOSIS — Z951 Presence of aortocoronary bypass graft: Secondary | ICD-10-CM

## 2022-12-12 DIAGNOSIS — E0829 Diabetes mellitus due to underlying condition with other diabetic kidney complication: Secondary | ICD-10-CM

## 2022-12-12 DIAGNOSIS — Z01812 Encounter for preprocedural laboratory examination: Secondary | ICD-10-CM

## 2022-12-12 DIAGNOSIS — E1169 Type 2 diabetes mellitus with other specified complication: Secondary | ICD-10-CM

## 2022-12-12 HISTORY — DX: Pneumonia, unspecified organism: J18.9

## 2022-12-12 NOTE — Patient Instructions (Addendum)
Your procedure is scheduled on: 12/19/22 - Thursday Report to the Registration Desk on the 1st floor of the [facility]. To find out your arrival time, please call [phone] between 1PM - 3PM on: 12/18/22 - Wednesday If your arrival time is 6:00 am, do not arrive before that time as the [facility] entrance doors do not open until 6:00 am.  REMEMBER: Instructions that are not followed completely may result in serious medical risk, up to and including death; or upon the discretion of your surgeon and anesthesiologist your surgery may need to be rescheduled.  Do not eat food after midnight the night before surgery.  No gum chewing or hard candies.  You may however, drink CLEAR liquids up to 2 hours before you are scheduled to arrive for your surgery. Do not drink anything within 2 hours of your scheduled arrival time.  Clear liquids include: - water   In addition, your doctor has ordered for you to drink the provided:  Gatorade G2 Drinking this carbohydrate drink up to two hours before surgery helps to reduce insulin resistance and improve patient outcomes. Please complete drinking 2 hours before scheduled arrival time.  One week prior to surgery, beginning 12/12/22. Stop diclofenac Sodium (VOLTAREN)  and Anti-inflammatories (NSAIDS) such as Advil, Aleve, Ibuprofen, Motrin, Naproxen, Naprosyn and Aspirin based products such as Excedrin, Goody's Powder, BC Powder.  Stop beginning 12/12/22  ANY OVER THE COUNTER supplements until after surgery.  You may take Tylenol if needed for pain up until the day of surgery.  Continue taking all prescribed medications with the exception of the following:  HOLD metFORMIN (GLUCOPHAGE-XR) beginning 12/17/22, may resume taking after surgery.  HUMULIN 70/30 - inject 1/2 of prescribed dose on the night before surgery and no insulin on the morning of surgery.  TAKE ONLY THESE MEDICATIONS THE MORNING OF SURGERY WITH A SIP OF WATER:  amLODipine  (NORVASC)  carvedilol (COREG)  levothyroxine (SYNTHROID)  Oxycodone if needed omeprazole (PRILOSEC) take the morning of surgery and take 1 on the night before surgery.   Use albuterol-ipratropium (COMBIVENT)  on the day of surgery and bring to the hospital.   No Alcohol for 24 hours before or after surgery.  No Smoking including e-cigarettes for 24 hours before surgery.  No chewable tobacco products for at least 6 hours before surgery.  No nicotine patches on the day of surgery.  Do not use any "recreational" drugs for at least a week (preferably 2 weeks) before your surgery.  Please be advised that the combination of cocaine and anesthesia may have negative outcomes, up to and including death. If you test positive for cocaine, your surgery will be cancelled.  On the morning of surgery brush your teeth with toothpaste and water, you may rinse your mouth with mouthwash if you wish. Do not swallow any toothpaste or mouthwash.  Use CHG Soap or wipes as directed on instruction sheet.  Do not wear jewelry, make-up, hairpins, clips or nail polish.  Do not wear lotions, powders, or perfumes.   Do not shave body hair from the neck down 48 hours before surgery.  Contact lenses, hearing aids and dentures may not be worn into surgery.  Do not bring valuables to the hospital. [facility] is not responsible for any missing/lost belongings or valuables.   Notify your doctor if there is any change in your medical condition (cold, fever, infection).  Wear comfortable clothing (specific to your surgery type) to the hospital.  After surgery, you can help  prevent lung complications by doing breathing exercises.  Take deep breaths and cough every 1-2 hours. Your doctor may order a device called an Incentive Spirometer to help you take deep breaths. When coughing or sneezing, hold a pillow firmly against your incision with both hands. This is called "splinting." Doing this helps protect your  incision. It also decreases belly discomfort.  If you are being admitted to the hospital overnight, leave your suitcase in the car. After surgery it may be brought to your room.  In case of increased patient census, it may be necessary for you, the patient, to continue your postoperative care in the Same Day Surgery department.  If you are being discharged the day of surgery, you will not be allowed to drive home. You will need a responsible individual to drive you home and stay with you for 24 hours after surgery.   If you are taking public transportation, you will need to have a responsible individual with you.  Please call the [facility] Dept. at [phone] if you have any questions about these instructions.  Surgery Visitation Policy:  Patients undergoing a surgery or procedure may have two family members or support persons with them as long as the person is not COVID-19 positive or experiencing its symptoms.   Inpatient Visitation:    Visiting hours are 7 a.m. to 8 p.m. Up to four visitors are allowed at one time in a patient room. The visitors may rotate out with other people during the day. One designated support person (adult) may remain overnight.  Due to an increase in RSV and influenza rates and associated hospitalizations, children ages 10 and under will not be able to visit patients in [facility]  Masks continue to be strongly recommended.    Preparing for Surgery with CHLORHEXIDINE GLUCONATE (CHG) Soap  Chlorhexidine Gluconate (CHG) Soap  o An antiseptic cleaner that kills germs and bonds with the skin to continue killing germs even after washing  o Used for showering the night before surgery and morning of surgery  Before surgery, you can play an important role by reducing the number of germs on your skin.  CHG (Chlorhexidine gluconate) soap is an antiseptic cleanser which kills germs and bonds with the skin to continue killing germs  even after washing.  Please do not use if you have an allergy to CHG or antibacterial soaps. If your skin becomes reddened/irritated stop using the CHG.  1. Shower the NIGHT BEFORE SURGERY and the MORNING OF SURGERY with CHG soap.  2. If you choose to wash your hair, wash your hair first as usual with your normal shampoo.  3. After shampooing, rinse your hair and body thoroughly to remove the shampoo.  4. Use CHG as you would any other liquid soap. You can apply CHG directly to the skin and wash gently with a scrungie or a clean washcloth.  5. Apply the CHG soap to your body only from the neck down. Do not use on open wounds or open sores. Avoid contact with your eyes, ears, mouth, and genitals (private parts). Wash face and genitals (private parts) with your normal soap.  6. Wash thoroughly, paying special attention to the area where your surgery will be performed.  7. Thoroughly rinse your body with warm water.  8. Do not shower/wash with your normal soap after using and rinsing off the CHG soap.  9. Pat yourself dry with a clean towel.  10. Wear clean pajamas to bed the night  before surgery.  12. Place clean sheets on your bed the night of your first shower and do not sleep with pets.  13. Shower again with the CHG soap on the day of surgery prior to arriving at the hospital.  14. Do not apply any deodorants/lotions/powders.  15. Please wear clean clothes to the hospital.  How to Use an Incentive Spirometer  An incentive spirometer is a tool that measures how well you are filling your lungs with each breath. Learning to take long, deep breaths using this tool can help you keep your lungs clear and active. This may help to reverse or lessen your chance of developing breathing (pulmonary) problems, especially infection. You may be asked to use a spirometer: After a surgery. If you have a lung problem or a history of smoking. After a long period of time when you have been unable  to move or be active. If the spirometer includes an indicator to show the highest number that you have reached, your health care provider or respiratory therapist will help you set a goal. Keep a log of your progress as told by your health care provider. What are the risks? Breathing too quickly may cause dizziness or cause you to pass out. Take your time so you do not get dizzy or light-headed. If you are in pain, you may need to take pain medicine before doing incentive spirometry. It is harder to take a deep breath if you are having pain. How to use your incentive spirometer  Sit up on the edge of your bed or on a chair. Hold the incentive spirometer so that it is in an upright position. Before you use the spirometer, breathe out normally. Place the mouthpiece in your mouth. Make sure your lips are closed tightly around it. Breathe in slowly and as deeply as you can through your mouth, causing the piston or the ball to rise toward the top of the chamber. Hold your breath for 3-5 seconds, or for as long as possible. If the spirometer includes a coach indicator, use this to guide you in breathing. Slow down your breathing if the indicator goes above the marked areas. Remove the mouthpiece from your mouth and breathe out normally. The piston or ball will return to the bottom of the chamber. Rest for a few seconds, then repeat the steps 10 or more times. Take your time and take a few normal breaths between deep breaths so that you do not get dizzy or light-headed. Do this every 1-2 hours when you are awake. If the spirometer includes a goal marker to show the highest number you have reached (best effort), use this as a goal to work toward during each repetition. After each set of 10 deep breaths, cough a few times. This will help to make sure that your lungs are clear. If you have an incision on your chest or abdomen from surgery, place a pillow or a rolled-up towel firmly against the incision  when you cough. This can help to reduce pain while taking deep breaths and coughing. General tips When you are able to get out of bed: Walk around often. Continue to take deep breaths and cough in order to clear your lungs. Keep using the incentive spirometer until your health care provider says it is okay to stop using it. If you have been in the hospital, you may be told to keep using the spirometer at home. Contact a health care provider if: You are having difficulty using the  spirometer. You have trouble using the spirometer as often as instructed. Your pain medicine is not giving enough relief for you to use the spirometer as told. You have a fever. Get help right away if: You develop shortness of breath. You develop a cough with bloody mucus from the lungs. You have fluid or blood coming from an incision site after you cough. Summary An incentive spirometer is a tool that can help you learn to take long, deep breaths to keep your lungs clear and active. You may be asked to use a spirometer after a surgery, if you have a lung problem or a history of smoking, or if you have been inactive for a long period of time. Use your incentive spirometer as instructed every 1-2 hours while you are awake. If you have an incision on your chest or abdomen, place a pillow or a rolled-up towel firmly against your incision when you cough. This will help to reduce pain. Get help right away if you have shortness of breath, you cough up bloody mucus, or blood comes from your incision when you cough. This information is not intended to replace advice given to you by your health care provider. Make sure you discuss any questions you have with your health care provider. Document Revised: 01/03/2020 Document Reviewed: 01/03/2020 Elsevier Patient Education  [place].

## 2022-12-12 NOTE — Telephone Encounter (Signed)
-----   Message from Karen Kitchens, NP sent at 12/12/2022  4:53 PM EST ----- Regarding: Request for pre-operative cardiac clearance Request for pre-operative cardiac clearance:  1. What type of surgery is being performed?  SHOULDER ARTHROSCOPY WITH DEBRIDEMENT, DECOMPRESSION, ROTATOR CUFF REPAIR AND BICEP TENDON REPAIR  2. When is this surgery scheduled?  12/19/2022  3. Type of clearance being requested (medical, pharmacy, both)? MEDICAL   4. Are there any medications that need to be held prior to surgery? NONE  5. Practice name and name of physician performing surgery?  Performing surgeon: Dr. Milagros Evener, MD Requesting clearance: Honor Loh, FNP-C    6. Anesthesia type (none, local, MAC, general)? GENERAL  7. What is the office phone and fax number?   Phone: [phone] Fax: [phone]  ATTENTION: Unable to create telephone message as per your standard workflow. Directed by [facility] providers to send requests for cardiac clearance to this pool for appropriate distribution to provider covering pre-operative clearances.   Honor Loh, MSN, APRN, FNP-C, CEN [facility]  Peri-operative Services Nurse Practitioner Phone: [phone] 12/12/22 4:53 PM

## 2022-12-13 ENCOUNTER — Telehealth: Payer: Self-pay | Admitting: *Deleted

## 2022-12-13 NOTE — Telephone Encounter (Signed)
I s/w the pt and she has been scheduled for a tele pre op appt 12/16/22 @ 1:40, add on due to procedure date. Pt thanked me for the help. Med rec and consent are done.      Patient Consent for Virtual Visit        Jobie Petrosyan Angell has provided verbal consent on 12/13/2022 for a virtual visit (video or telephone).   CONSENT FOR VIRTUAL VISIT FOR:  Michae Kava Gardiner  By participating in this virtual visit I agree to the following:  I hereby voluntarily request, consent and authorize [facility] and its employed or contracted physicians, physician assistants, nurse practitioners or other licensed health care professionals (the Practitioner), to provide me with telemedicine health care services (the "Services") as deemed necessary by the treating Practitioner. I acknowledge and consent to receive the Services by the Practitioner via telemedicine. I understand that the telemedicine visit will involve communicating with the Practitioner through live audiovisual communication technology and the disclosure of certain medical information by electronic transmission. I acknowledge that I have been given the opportunity to request an in-person assessment or other available alternative prior to the telemedicine visit and am voluntarily participating in the telemedicine visit.  I understand that I have the right to withhold or withdraw my consent to the use of telemedicine in the course of my care at any time, without affecting my right to future care or treatment, and that the Practitioner or I may terminate the telemedicine visit at any time. I understand that I have the right to inspect all information obtained and/or recorded in the course of the telemedicine visit and may receive copies of available information for a reasonable fee.  I understand that some of the potential risks of receiving the Services via telemedicine include:  Delay or interruption in medical evaluation due to technological  equipment failure or disruption; Information transmitted may not be sufficient (e.g. poor resolution of images) to allow for appropriate medical decision making by the Practitioner; and/or  In rare instances, security protocols could fail, causing a breach of personal health information.  Furthermore, I acknowledge that it is my responsibility to provide information about my medical history, conditions and care that is complete and accurate to the best of my ability. I acknowledge that Practitioner's advice, recommendations, and/or decision may be based on factors not within their control, such as incomplete or inaccurate data provided by me or distortions of diagnostic images or specimens that may result from electronic transmissions. I understand that the practice of medicine is not an exact science and that Practitioner makes no warranties or guarantees regarding treatment outcomes. I acknowledge that a copy of this consent can be made available to me via my patient portal ([facility]), or I can request a printed copy by calling the office of [facility].    I understand that my insurance will be billed for this visit.   I have read or had this consent read to me. I understand the contents of this consent, which adequately explains the benefits and risks of the Services being provided via telemedicine.  I have been provided ample opportunity to ask questions regarding this consent and the Services and have had my questions answered to my satisfaction. I give my informed consent for the services to be provided through the use of telemedicine in my medical care

## 2022-12-13 NOTE — Telephone Encounter (Signed)
   Name: Jennifer [name]  DOB: 08-17-1956  MRN: [id number]  Primary Cardiologist: Ida Rogue, MD   Preoperative team, please contact this patient and set up a phone call appointment for further preoperative risk assessment. Please obtain consent and complete medication review. Thank you for your help.   Mayra Reel, NP 12/13/2022, 12:15 PM [facility]

## 2022-12-13 NOTE — Telephone Encounter (Signed)
I s/w the pt and she has been scheduled for a tele pre op appt 12/16/22 @ 1:40, add on due to procedure date. Pt thanked me for the help. Med rec and consent are done.

## 2022-12-15 NOTE — Progress Notes (Unsigned)
Virtual Visit via Telephone Note   Because of Jennifer [name]'s co-morbid illnesses, she is at least at moderate risk for complications without adequate follow up.  This format is felt to be most appropriate for this patient at this time.  The patient did not have access to video technology/had technical difficulties with video requiring transitioning to audio format only (telephone).  All issues noted in this document were discussed and addressed.  No physical exam could be performed with this format.  Please refer to the patient's chart for her consent to telehealth for [facility].  Evaluation Performed:  Preoperative cardiovascular risk assessment _____________   Date:  12/16/2022   Patient [name], DOB 06-Nov-1955, MRN [id number] Patient Location:  Home Provider location:   Office  Primary Care Provider:  Valera Castle, MD Primary Cardiologist:  Ida Rogue, MD  Chief Complaint / Patient Profile   67 y.o. y/o female with a h/o CAD with PTCA of LAD and stent placement January 2007, repeat catheterization August 2011 with stent placement of mid LAD, catheterization October 2012 for chest pain that showed severe mid LAD in-stent restenosis at the site of the previous DES, also with severe mid RCA disease, moderate to severe proximal diagonal #1 disease with ejection fraction 55%, s/p CABG 3 08/12/2011, cardiac cath 05/2013 100% occluded mid LAD at site of stent, mild mRCA disease, occluded saphenous vein graft to dRCA, medical management recommended, low risk myoview 07/2019, HLD, DM, chronic opioid use, HTN,  who is pending SHOULDER ARTHROSCOPY WITH DEBRIDEMENT, DECOMPRESSION, ROTATOR CUFF REPAIR AND BICEP TENDON REPAIR  and presents today for telephonic preoperative cardiovascular risk assessment.  History of Present Illness    Jennifer [name] is a 66 y.o. female who presents via audio/video conferencing for a telehealth visit today.  Pt was last seen  in cardiology clinic on 07/26/2022 by Dr. Rockey Situ. At that time Elzada Mccreedy Pfund was doing well.  The patient is now pending procedure as outlined above. Since her last visit, she  denies chest pain, shortness of breath, lower extremity edema, fatigue, palpitations, melena, hematuria, hemoptysis, diaphoresis, weakness, presyncope, syncope, orthopnea, and PND.   Past Medical History    Past Medical History:  Diagnosis Date   Anemia 1985   bled during a cervical biopsy, led to bleed transfusion    Anginal pain ([place])    Anxiety    Arthritis    pt. reports that her neck is stiff    Bipolar depression ([place])    Blood dyscrasia    reported hx [name] '85; saw hematologist Dr. 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 Gar Ponto [facility]    PCP   CRD (chronic renal disease)    Depression    Depression with anxiety    Diabetes mellitus    Dyspnea    W/ EXERTION    Enlarged liver    Family history of adverse reaction to anesthesia    father woke up slowly   Fibromyalgia    Generalized anxiety disorder    GERD (gastroesophageal reflux disease)    Hyperlipidemia    Hypertension    Hypothyroidism    Low back pain    Low sodium  levels    Migraines    Neuropathy    Neuropathy    Persistent disorder of initiating or maintaining sleep    Pneumonia    Restless leg    Von Willebrand disease ([place])    Past Surgical History:  Procedure Laterality Date   ABDOMINAL HYSTERECTOMY     ANTERIOR LAT LUMBAR FUSION N/A 12/10/2016   Procedure: LUMBAR FOUR-FIVE  Anterior lateral lumbar interbody fusion with LUMBAR FOUR-FIVE  Posterolateral fusion/Re-operative laminectomy LUMBAR FIVE-SACRUM ONE Bilateral, Laminectomy at LUMBAR FOUR-FIVE , 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   and Left LUMBAR FOUR-FIVE  with Mazor;  Surgeon: Kevan Ny Ditty, MD;  Location: [place];  Service: Neurosurgery;  Laterality: N/A;   APPLICATION OF ROBOTIC ASSISTANCE FOR SPINAL PROCEDURE N/A 12/10/2016   Procedure: APPLICATION OF ROBOTIC ASSISTANCE FOR SPINAL PROCEDURE;  Surgeon: Kevan Ny Ditty, MD;  Location: [place];  Service: Neurosurgery;  Laterality: N/A;   BACK SURGERY  2007   lumbar   BREAST IMPLANT REMOVAL     CARDIAC CATHETERIZATION     CAD,PCI of LAD, Cypher stent 2.5-28mm   CARDIAC CATHETERIZATION     PCI of mid-LAD in stent restenosis with a drug-eluting stent   CARDIAC CATHETERIZATION  06/03/2013   [facility];MID LAD 100 % STENOSIS; PRIOR STENT; MID RCA 40 % STENOSIS   COLONOSCOPY     COLONOSCOPY WITH PROPOFOL N/A 08/13/2018   Procedure: COLONOSCOPY WITH PROPOFOL;  Surgeon: [name], MD;  Location: [facility] ENDOSCOPY;  Service: Gastroenterology;  Laterality: N/A;   CORONARY ARTERY BYPASS GRAFT  08/12/2011   CABG x 3   CORONARY STENT PLACEMENT     ESOPHAGOGASTRODUODENOSCOPY (EGD) WITH PROPOFOL N/A 08/13/2018   Procedure: ESOPHAGOGASTRODUODENOSCOPY (EGD) WITH PROPOFOL;  Surgeon: [name], MD;  Location: [facility] ENDOSCOPY;  Service: Gastroenterology;  Laterality: N/A;   LUMBAR FUSION     L1-S1   LUMBAR PERCUTANEOUS PEDICLE SCREW 1 LEVEL N/A 12/10/2016   Procedure: Extension of pedicle screw fixation to LUMBAR FOUR;  Surgeon: Kevan Ny Ditty, MD;  Location: [place];  Service: Neurosurgery;  Laterality: N/A;   NASAL SINUS SURGERY  1997   tuirbinate reduction    RECTAL EXAM UNDER ANESTHESIA N/A 10/06/2018   Procedure: RECTAL EXAM UNDER ANESTHESIA;  Surgeon: Jules Husbands, MD;  Location: [facility] ORS;  Service: General;  Laterality: N/A;    Allergies  Allergies  Allergen Reactions   Aspirin Other (See Comments)    Home Medications    Prior to Admission medications   Medication Sig Start Date End Date Taking? Authorizing Provider  acetaminophen (TYLENOL) 500 MG tablet  Take 1,000 mg by mouth every 6 (six) hours as needed.    [provider]  albuterol-ipratropium (COMBIVENT) 18-103 MCG/ACT inhaler Inhale 2 puffs into the lungs daily as needed for wheezing or shortness of breath.     [provider]  amLODipine (NORVASC) 10 MG tablet Take 1 tablet (10 mg total) by mouth daily. 09/26/20   Minna Merritts, MD  atorvastatin (LIPITOR) 80 MG tablet Take 1 tablet (80 mg total) by mouth daily. 03/27/21   Minna Merritts, MD  carisoprodol (SOMA) 350 MG tablet Take 1 tablet (350 mg total) by mouth 2 (two) times daily as needed for muscle spasms. Patient taking differently: Take 250 mg by mouth 4 (four) times daily. 01/02/20   Loletha Grayer, MD  carvedilol (COREG) 12.5 MG tablet Take 1 tablet (12.5 mg total) by mouth 2 (two) times daily with a meal. 09/26/20   Gollan,  Kathlene November, MD  Continuous Blood Gluc Receiver (FREESTYLE LIBRE 2 READER) DEVI Use 1 Device as directed 11/22/21   [provider]  DETROL LA 4 MG 24 hr capsule Take 4 mg by mouth daily. 12/17/19   [provider]  diclofenac Sodium (VOLTAREN) 1 % GEL as needed. 09/03/21   [provider]  divalproex (DEPAKOTE ER) 250 MG 24 hr tablet Take 1 tablet (250 mg total) by mouth daily with supper. Take along with 500 mg , total of 750 mg daily 11/15/22   Ursula Alert, MD  divalproex (DEPAKOTE ER) 500 MG 24 hr tablet Take 1 tablet (500 mg total) by mouth daily with supper. Take along with 250 mg daily 11/15/22   Ursula Alert, MD  doxepin (SINEQUAN) 10 MG capsule TAKE 1 TO 2 CAPSULES AT BEDTIME AS NEEDED (DOSE REDUCTION) 09/11/22   Ursula Alert, MD  ezetimibe (ZETIA) 10 MG tablet Take 10 mg by mouth daily. 08/28/21   [provider]  FLUoxetine (PROZAC) 20 MG capsule Take 1 capsule (20 mg total) by mouth daily. 09/11/22   Ursula Alert, MD  fluticasone (FLONASE) 50 MCG/ACT nasal spray Place 1 spray into both nostrils daily as needed.  11/05/19   [provider]  glucose blood (FREESTYLE LITE) test strip 3 (three) times daily 05/22/21   [provider]  HUMULIN 70/30 KWIKPEN (70-30) 100 UNIT/ML PEN Inject 10-15 Units into the skin See admin instructions. Inject 22 units SQ in the morning and inject 22 units SQ at bedtime per sliding scale 05/07/16   [provider]  ibuprofen (ADVIL) 200 MG tablet Take 200 mg by mouth every 6 (six) hours as needed. 400 mg    [provider]  levothyroxine (SYNTHROID) 25 MCG tablet TAKE 1 TABLET ONCE DAILY TAKE ON AN EMPTY STOMACH WITH A GLASS OF WATER AT LEAST 30 TO 60 MINUTES BEFORE BREAKFAST 06/03/22   [provider]  lidocaine (LIDODERM) 5 % as needed. 06/25/22   [provider]  Melatonin 10 MG TABS Take 1 tablet by mouth.    [provider]  metFORMIN (GLUCOPHAGE-XR) 750 MG 24 hr tablet Take 750 mg by mouth in the morning and at bedtime.    [provider]  naloxone [facility]) nasal spray 4 mg/0.1 mL SMARTSIG:Both Nares 06/11/21   [provider]  nitroGLYCERIN (NITROSTAT) 0.4 MG SL tablet Place 1 tablet (0.4 mg total) under the tongue every 5 (five) minutes as needed for chest pain. 08/18/17   Minna Merritts, MD  omeprazole (PRILOSEC) 20 MG capsule Take 20 mg by mouth daily.    [provider]  Oxycodone HCl 10 MG TABS LIMIT ONE HALF TO ONE TABLET BY MOUTH FOUR TO SIX TIMES PER DAY IF TOLERATED (NO HYDROCODONE ACETAMINOPHEN) 04/27/19   [provider]  pregabalin (LYRICA) 75 MG capsule Take by mouth. 07/08/22 07/08/23  [provider]  RESTASIS 0.05 % ophthalmic emulsion Place 1 drop into both eyes daily as needed (for dryness). 09/16/15   [provider]  rOPINIRole (REQUIP) 0.5 MG tablet Take 1 mg by mouth at bedtime.  03/06/18   [provider]  sitaGLIPtin (JANUVIA) 100 MG tablet Take 100 mg by mouth daily. 09/24/16 12/13/22  [provider]  SURE COMFORT PEN NEEDLES 31G X 8 MM Martin   06/21/22   [provider]  telmisartan (MICARDIS) 80 MG tablet Take 1 tablet (80 mg total) by mouth daily. 09/26/20   Minna Merritts, MD  Physical Exam    Vital Signs:  Eldene Blonigen Balow does not have vital signs available for review today.  Given telephonic nature of communication, physical exam is limited. AAOx3. NAD. Normal affect.  Speech and respirations are unlabored.  Accessory Clinical Findings    None  Assessment & Plan    1.  Preoperative Cardiovascular Risk Assessment: The patient is doing well from a cardiac perspective. Therefore, based on ACC/AHA guidelines, the patient would be at acceptable risk for the planned procedure without further cardiovascular testing. According to the Revised Cardiac Risk Index (RCRI), her Perioperative Risk of Major Cardiac Event is (%): 11. Her Functional Capacity in METs is: 5.62 according to the [facility] Activity Status Index (DASI).  The patient was advised that if she develops new symptoms prior to surgery to contact our office to arrange for a follow-up visit, and she verbalized understanding.  A copy of this note will be routed to requesting surgeon.  Time:   Today, I have spent 10 minutes with the patient with telehealth technology discussing medical history, symptoms, and management plan.    Emmaline Life, NP-C  12/16/2022, 3:39 PM [address] Office [phone] Fax [phone]

## 2022-12-16 ENCOUNTER — Encounter: Payer: Self-pay | Admitting: Nurse Practitioner

## 2022-12-16 ENCOUNTER — Encounter
Admission: RE | Admit: 2022-12-16 | Discharge: 2022-12-16 | Disposition: A | Discharge status: Home / Self Care | Payer: Medicare Other | Source: Ambulatory Visit | Attending: Surgery | Admitting: Surgery

## 2022-12-16 ENCOUNTER — Ambulatory Visit: Payer: Medicare Other | Attending: Cardiovascular Disease | Admitting: Nurse Practitioner

## 2022-12-16 DIAGNOSIS — Z01810 Encounter for preprocedural cardiovascular examination: Secondary | ICD-10-CM

## 2022-12-16 DIAGNOSIS — Z01812 Encounter for preprocedural laboratory examination: Secondary | ICD-10-CM | POA: Insufficient documentation

## 2022-12-16 DIAGNOSIS — E0829 Diabetes mellitus due to underlying condition with other diabetic kidney complication: Secondary | ICD-10-CM | POA: Insufficient documentation

## 2022-12-16 LAB — BASIC METABOLIC PANEL
Anion gap: 11 (ref 5–15)
BUN: 13 mg/dL (ref 8–23)
CO2: 26 mmol/L (ref 22–32)
Calcium: 9.4 mg/dL (ref 8.9–10.3)
Chloride: 98 mmol/L (ref 98–111)
Creatinine, Ser: 0.62 mg/dL (ref 0.44–1.00)
GFR, Estimated: >60 mL/min (ref >60)
Glucose, Bld: 206 mg/dL — ABNORMAL HIGH (ref 70–99)
Potassium: 4 mmol/L (ref 3.5–5.1)
Sodium: 135 mmol/L (ref 135–145)

## 2022-12-16 LAB — CBC
HCT: 36.9 % (ref 36.0–46.0)
Hemoglobin: 12.1 g/dL (ref 12.0–15.0)
MCH: 28.9 pg (ref 26.0–34.0)
MCHC: 32.8 g/dL (ref 30.0–36.0)
MCV: 88.1 fL (ref 80.0–100.0)
Platelets: 305 10*3/uL (ref 150–400)
RBC: 4.19 MIL/uL (ref 3.87–5.11)
RDW: 15.1 % (ref 11.5–15.5)
WBC: 6.3 10*3/uL (ref 4.0–10.5)
nRBC: 0 % (ref 0.0–0.2)

## 2022-12-17 ENCOUNTER — Encounter: Payer: Self-pay | Admitting: Surgery

## 2022-12-17 NOTE — Progress Notes (Signed)
Perioperative / Anesthesia Services  Pre-Admission Testing Clinical Review / Preoperative Anesthesia Consult  Date: 12/18/22  Patient Demographics:  Name: Jennifer [name] DOB:   1955-12-18 MRN:   [id number]  Planned Surgical Procedure(s):    Case: [id number] Date/Time: 12/19/22 1214   Procedure: SHOULDER ARTHROSCOPY WITH DEBRIDEMENT, DECOMPRESSION, ROTATOR CUFF REPAIR AND BICEP TENDON REPAIR (Right: Shoulder)   Anesthesia type: Choice   Pre-op diagnosis:      Pain and swelling of right shoulder M25.511, M25.411     impingement of right shoulder M75.41     Arthritis of right acromioclavicular joint M19.011     Nontraumatic incomplete tear of right rotator cuff M75.111     Subacromial bursitis of right shoulder joint M75.51   Location: [facility] OR ROOM 03 / [place] [facility]   Surgeons: Corky Mull, MD     NOTE: Available PAT nursing documentation and vital signs have been reviewed. Clinical nursing staff has updated patient's PMH/PSHx, current medication list, and drug allergies/intolerances to ensure comprehensive history available to assist in medical decision making as it pertains to the aforementioned surgical procedure and anticipated anesthetic course. Extensive review of available clinical information personally performed. [place] PMH and PSHx updated with any diagnoses/procedures that  may have been inadvertently omitted during her intake with the pre-admission testing department's nursing staff.  Clinical Discussion:  Jennifer [name] is a 67 y.o. female who is submitted for pre-surgical anesthesia review and clearance prior to her undergoing the above procedure. Patient is a Former Smoker (quit 05/2010). Pertinent PMH includes: CAD (s/p CABG), angina, HTN, HLD, T2DM, hypothyroidism, COPD, CKD, DOE, GERD (on daily PPI), OA, chronic pain syndrome, COPD (has naloxone Rx), hepatomegaly, anxiety, depression, bipolar disorder, RLS.  Patient is followed by cardiology  Rockey Situ, MD). She was last seen in the cardiology clinic on 07/26/2022; notes reviewed. At the time of her clinic visit, patient doing well overall from a cardiovascular perspective.  Patient reporting episodes of shortness of breath, nonspecific chest discomfort, generalized weakness, and upper extremity arthralgias with associated paresthesias. Patient denied any chest pain, PND, orthopnea, palpitations, significant peripheral edema, fatigue, vertiginous symptoms, or presyncope/syncope. Patient with a past medical history significant for cardiovascular diagnoses. Documented physical exam was grossly benign, providing no evidence of acute exacerbation and/or decompensation of the patient's known cardiovascular conditions.  Patient underwent diagnostic LEFT heart catheterization in January 2007.  Catheterization report unavailable for review at time of consult.  Per notes from cardiology, patient underwent PCI and placement of a 2.5 x 28 mm Cypher DES x 1 to the LAD.  Patient underwent diagnostic LEFT heart catheterization on 06/20/2010 revealing multivessel CAD; 50% proximal LAD, 90% mid LAD, 60% D1, and 50% proximal RCA.  Subsequent PCI was performed placing a 2.5 x 15 mm Xience V DES to the mid LAD.  Procedure yielded excellent angiographic result and TIMI-3 flow.  Repeat diagnostic LEFT heart catheterization was performed on 08/06/2011 revealing significant (95%) in-stent restenosis of the previously placed stent to the mid LAD.  Additionally, there was 70% stenosis of D1 and 90% stenosis of the mid RCA.  Given the complexity of her disease, patient was referred to [facility] for consultation with CVTS regarding revascularization procedure.  Patient underwent revascularization procedure on 08/12/2011.  Three-vessel CABG performed using LIMA-LAD, SVG-RCA, and SVG-D1 bypass grafts.  Diagnostic LEFT heart catheterization was performed on 06/03/2013 again revealing significant (100%) in-stent  restenosis of the previously placed mid LAD stent.  There was also 40% stenosis  of the mid RCA and 100% stenosis of the ostial SVG-RCA bypass graft.  Interventional radiology consulted and decision was made to defer further intervention opting for aggressive medical management.  Most recent TTE was performed on 11/30/2013 revealing normal left ventricular systolic function with an EF of 60%.  There were no significant regional wall motion abnormalities.  There was mild LVH.  Right ventricular size and function normal.  Trivial pulmonary valve regurgitation was observed.  All transvalvular gradients were noted to be normal providing no evidence suggestive of valvular stenosis.  Most recent myocardial perfusion imaging study was performed on 08/26/2019 revealing a normal left ventricular systolic function with a hyperdynamic LVEF of 76%. There was no evidence of stress-induced myocardial ischemia or arrhythmia; no scintigraphic evidence of scar.  CT attenuation correction images demonstrated coronary calcification in the LAD territory as well as aortic atherosclerosis.  Study determined to be low risk overall.  Blood pressure well controlled at 110/60 mmHg on currently prescribed CCB (amlodipine), beta-blocker (carvedilol), and ARB (telmisartan) therapies.  Patient is on atorvastatin for her HLD diagnosis and ASCVD prevention.  Additionally, patient has a supply of short acting nitrates (NTG) to use on a as needed basis for recurrent angina/anginal equivalent symptoms; denied recent use. T2DM suboptimally controlled on currently prescribed regimen; last HgbA1c was 8.1% when checked on 09/12/2022. She does not have an OSAH diagnosis. Functional capacity somewhat limited by patient's age, multiple medical comorbidities, and reported arthritides, however with that being said, patient still felt to be able to achieve at least 4 METS of physical activity without experiencing any degree of significant angina/anginal  equivalent symptoms. No changes were made to her medication regimen during her visit with cardiology.  Patient scheduled to follow-up with outpatient cardiology in 1 year or sooner if needed.  Jennifer [name] is scheduled for an elective SHOULDER ARTHROSCOPY WITH DEBRIDEMENT, DECOMPRESSION, ROTATOR CUFF REPAIR AND BICEP TENDON REPAIR (Right: Shoulder) on 12/19/2022 with Dr. Milagros Evener, MD.  Given patient's past medical history significant for cardiovascular diagnoses, presurgical cardiac clearance was sought by the PAT team.  Per cardiology, "the patient is doing well from a cardiac perspective. According to the RCRI, her perioperative risk of major cardiac event is 11%. Her functional capacity in METs is 5.62 according to the DASI. Therefore, based on ACC/AHA guidelines, the patient would be at an ACCEPTABLE risk for the planned procedure without further cardiovascular testing".  In review of her medication reconciliation, the patient is not noted to be taking any type of anticoagulation or antiplatelet therapies that would need to be held during her perioperative course.  Patient denies previous perioperative complications with anesthesia in the past.  Patient does make mention of a history of (+) delayed emergence and a first-degree relative (father). In review of the available records, it is noted that patient underwent a general anesthetic course here at [facility] ([facility]) in 09/2018 without documented complications.      11/13/2022    1:05 PM 09/11/2022    2:14 PM 09/11/2022    1:48 PM  Vitals with BMI  Height 5' 3"$   5' 3"$   Weight 162 lbs 10 oz  157 lbs  BMI [id number]  A999333  Systolic [id number]  Diastolic 76 66 86  Pulse 80 88 86    Providers/Specialists:   NOTE: Primary physician provider listed below. Patient may have been seen by APP or partner within same practice.   PROVIDER ROLE / SPECIALTY LAST OV  Poggi, Marshall Cork, MD Orthopedics (Surgeon)  12/11/2022  Valera Castle, MD Primary Care Provider 09/12/2022  Ida Rogue, MD Cardiology 07/26/2022  Ursula Alert, MD Psychiatry 11/13/2022   Allergies:  Aspirin  Current Home Medications:   No current facility-administered medications for this encounter.    acetaminophen (TYLENOL) 500 MG tablet   albuterol-ipratropium (COMBIVENT) 18-103 MCG/ACT inhaler   amLODipine (NORVASC) 10 MG tablet   atorvastatin (LIPITOR) 80 MG tablet   carisoprodol (SOMA) 350 MG tablet   carvedilol (COREG) 12.5 MG tablet   Continuous Blood Gluc Receiver (FREESTYLE LIBRE 2 READER) DEVI   DETROL LA 4 MG 24 hr capsule   diclofenac Sodium (VOLTAREN) 1 % GEL   divalproex (DEPAKOTE ER) 250 MG 24 hr tablet   divalproex (DEPAKOTE ER) 500 MG 24 hr tablet   doxepin (SINEQUAN) 10 MG capsule   ezetimibe (ZETIA) 10 MG tablet   FLUoxetine (PROZAC) 20 MG capsule   fluticasone (FLONASE) 50 MCG/ACT nasal spray   glucose blood (FREESTYLE LITE) test strip   HUMULIN 70/30 KWIKPEN (70-30) 100 UNIT/ML PEN   ibuprofen (ADVIL) 200 MG tablet   levothyroxine (SYNTHROID) 25 MCG tablet   lidocaine (LIDODERM) 5 %   Melatonin 10 MG TABS   metFORMIN (GLUCOPHAGE-XR) 750 MG 24 hr tablet   naloxone (NARCAN) nasal spray 4 mg/0.1 mL   nitroGLYCERIN (NITROSTAT) 0.4 MG SL tablet   omeprazole (PRILOSEC) 20 MG capsule   Oxycodone HCl 10 MG TABS   pregabalin (LYRICA) 75 MG capsule   RESTASIS 0.05 % ophthalmic emulsion   rOPINIRole (REQUIP) 0.5 MG tablet   sitaGLIPtin (JANUVIA) 100 MG tablet   SURE COMFORT PEN NEEDLES 31G X 8 MM MISC   telmisartan (MICARDIS) 80 MG tablet   History:   Past Medical History:  Diagnosis Date   Anginal pain (HCC)    Arthritis    Bipolar disorder (HCC)    Blood loss anemia 1985   a.) procedural bleeding during/following cervical Bx --> required PRBC transfusion   CAD (coronary artery disease)    a.) LHC/PCI 10/2005: 2.5 x 28 mm Cypher DES to LAD; b.) LHC/PCI 06/20/2010: 50% pLAD, 90%  mLAD (2.5 x 15 mm Xience V DES), 60% D1, 50% pRCA; c.) LHC 08/06/2011: 95% ISR mLAD, 70% D1, 90% mRCA --> refer to CVTS; d.) 3v CABG 08/12/2011; e.) LHC 06/03/2013: 100% ISR mLAD, 40% mRCA, 100% oSVG-RCA - med mgmt   Cervical dysplasia    Chronic pain syndrome    a.) followed by pain management/psychiatry   Chronic, continuous use of opioids    a.) followed by pain management/psychiatry; b.) has naloxone Rx available   COPD (chronic obstructive pulmonary disease) (HCC)    CRD (chronic renal disease)    Depression    Dyspnea on exertion    Enlarged liver    Family history of adverse reaction to anesthesia    a.) delayed emergence in 1st degree relative (father)   Fibromyalgia    Generalized anxiety disorder    GERD (gastroesophageal reflux disease)    Hyperlipidemia    Hypertension    Hypothyroidism    Low back pain    Low sodium levels    Migraines    Neuropathy    Persistent disorder of initiating or maintaining sleep    a.) uses melatonin + carisoprodol   Pneumonia    Primary cancer of skin of ear    Restless leg syndrome    a.) on ropinirole   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   a.)  LIMA-LAD, SVG-RCA, SVG-D1   Type 2 diabetes mellitus treated with insulin ([place])    Von Willebrand disease ([place])    a.) VWD workup normal in 2018; DOES NOT have primary bleeding disorder; b.) has O positive blood group (can have mildly low levels of VWF), hence why patient may have been told she has VWD   Past Surgical History:  Procedure Laterality Date   ANTERIOR LAT LUMBAR FUSION N/A 12/10/2016   Procedure: LUMBAR FOUR-FIVE  Anterior lateral lumbar interbody fusion with LUMBAR FOUR-FIVE  Posterolateral fusion/Re-operative laminectomy LUMBAR FIVE-SACRUM ONE Bilateral, Laminectomy at LUMBAR FOUR-FIVE , 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  and Left LUMBAR FOUR-FIVE  with Mazor;  Surgeon: Kevan Ny Ditty, MD;  Location: [place];  Service: Neurosurgery;  Laterality: N/A;   APPLICATION  OF ROBOTIC ASSISTANCE FOR SPINAL PROCEDURE N/A 12/10/2016   Procedure: APPLICATION OF ROBOTIC ASSISTANCE FOR SPINAL PROCEDURE;  Surgeon: Kevan Ny Ditty, MD;  Location: [place];  Service: Neurosurgery;  Laterality: N/A;   BREAST IMPLANT REMOVAL Bilateral 1993   COLONOSCOPY     COLONOSCOPY WITH PROPOFOL N/A 08/13/2018   Procedure: COLONOSCOPY WITH PROPOFOL;  Surgeon: [name], MD;  Location: [facility];  Service: Gastroenterology;  Laterality: N/A;   CORONARY ANGIOPLASTY WITH STENT PLACEMENT Left 10/2005   Procedure: CORONARY ANGIOPLASTY WITH STENT PLACEMENT   CORONARY ANGIOPLASTY WITH STENT PLACEMENT Left 06/20/2010   Procedure: CORONARY ANGIOPLASTY WITH STENT PLACEMENT; Location: [place]; Surgeon: Ida Rogue, MD   CORONARY ARTERY BYPASS GRAFT  08/12/2011   Procedure: CORONARY ARTERY BYPASS GRAFT; Location: [place]; Surgeon: Modesto Charon, MD   ESOPHAGOGASTRODUODENOSCOPY (EGD) WITH PROPOFOL N/A 08/13/2018   Procedure: ESOPHAGOGASTRODUODENOSCOPY (EGD) WITH PROPOFOL;  Surgeon: [name], MD;  Location: [facility];  Service: Gastroenterology;  Laterality: N/A;   LEFT HEART CATH AND CORONARY ANGIOGRAPHY Left 08/06/2011   Procedure: LEFT HEART CATH AND CORONARY ANGIOGRAPHY; Location: [place]; Surgeon: Ida Rogue, MD   LEFT HEART CATH AND CORS/GRAFTS ANGIOGRAPHY Left 06/03/2013   Procedure: LEFT HEART CATH AND CORS/GRAFTS ANGIOGRAPHY; Location: [place]; Surgeon: Ida Rogue, MD   LUMBAR FUSION  2007   L1-S1   LUMBAR PERCUTANEOUS PEDICLE SCREW 1 LEVEL N/A 12/10/2016   Procedure: Extension of pedicle screw fixation to LUMBAR FOUR;  Surgeon: Kevan Ny Ditty, MD;  Location: [place];  Service: Neurosurgery;  Laterality: N/A;   PLACEMENT OF BREAST IMPLANTS Bilateral 1987   RECTAL EXAM UNDER ANESTHESIA N/A 10/06/2018   Procedure: RECTAL EXAM UNDER ANESTHESIA;  Surgeon: Jules Husbands, MD;  Location: [facility]S;  Service: General;  Laterality: N/A;   TOTAL ABDOMINAL  HYSTERECTOMY W/ BILATERAL SALPINGOOPHORECTOMY N/A 1985   [place]   Family History  Problem Relation Age of Onset   Aneurysm Father        abdominal   Diabetes Father    Alcohol abuse Father    Hypertension Father    Aneurysm Brother        abdominal   Alcohol abuse Brother    Hypertension Brother    Colon cancer Brother    Diabetes Mother    Alcohol abuse Mother    Hypertension Other        family hx   Hyperlipidemia Other        family hx   Diabetes Other        family hx   Social History   Tobacco Use   Smoking status: Former    Years: 16.00    Types: Cigarettes    Quit  date: 06/20/2010    Years since quitting: 12.5   Smokeless tobacco: Never  Vaping Use   Vaping Use: Every day   Substances: Nicotine, Flavoring  Substance Use Topics   Alcohol use: No    Alcohol/week: 0.0 standard drinks of alcohol   Drug use: No    Pertinent Clinical Results:  LABS:   Hospital Outpatient Visit on 12/16/2022  Component Date Value Ref Range Status   WBC 12/16/2022 6.3  4.0 - 10.5 K/uL Final   RBC 12/16/2022 4.19  3.87 - 5.11 MIL/uL Final   Hemoglobin 12/16/2022 12.1  12.0 - 15.0 g/dL Final   HCT 12/16/2022 36.9  36.0 - 46.0 % Final   MCV 12/16/2022 88.1  80.0 - 100.0 fL Final   MCH 12/16/2022 28.9  26.0 - 34.0 pg Final   MCHC 12/16/2022 32.8  30.0 - 36.0 g/dL Final   RDW 12/16/2022 15.1  11.5 - 15.5 % Final   Platelets 12/16/2022 305  150 - 400 K/uL Final   nRBC 12/16/2022 0.0  0.0 - 0.2 % Final   Performed at [facility], [place], [place] [address]   Sodium 12/16/2022 135  135 - 145 mmol/L Final   Potassium 12/16/2022 4.0  3.5 - 5.1 mmol/L Final   Chloride 12/16/2022 98  98 - 111 mmol/L Final   CO2 12/16/2022 26  22 - 32 mmol/L Final   Glucose, Bld 12/16/2022 206 (H)  70 - 99 mg/dL Final   Glucose reference range applies only to samples taken after fasting for at least 8 hours.   BUN 12/16/2022 13  8 - 23 mg/dL Final    Creatinine, Ser 12/16/2022 0.62  0.44 - 1.00 mg/dL Final   Calcium 12/16/2022 9.4  8.9 - 10.3 mg/dL Final   GFR, Estimated 12/16/2022 >60  >60 mL/min Final   Comment: (NOTE) Calculated using the CKD-EPI Creatinine Equation (2021)    Anion gap 12/16/2022 11  5 - 15 Final   Performed at [facility], [address] [place], [place] [address]    ECG: Date: 07/26/2022 Time ECG obtained: 0953 AM Rate: 71 bpm Rhythm: normal sinus Axis (leads I and aVF): Normal Intervals: PR 142 ms. QRS 70 ms. QTc 445 ms. ST segment and T wave changes: No evidence of acute ST segment elevation or depression.  Evidence of an age undetermined septal infarct present. Comparison: Similar to previous tracing obtained on 03/27/2021   IMAGING / PROCEDURES: MR SHOULDER RIGHT WO CONTRAST performed on 11/02/2022 Small partial-thickness within the midsubstance of the posterior supraspinatus tendon footprint measuring up to 5 mm in AP dimension. No tendon retraction. Mild-to-moderate superior subscapularis tendinosis. Trace fluid within the midsubstance of the mid to superior subscapularis tendon may represent normal fluid in between the subscapularis tendon bundles versus a tiny interstitial tear. Mild-to-moderate proximal long head of the biceps tendinosis with mild superior tendon degenerative fraying. Mild degenerative changes of the acromioclavicular joint. Mild-to-moderate anteriorly directed spur at the lateral aspect of the acromion. Mild-to-moderate subacromial/subdeltoid bursitis. Mild-to-moderate posterior glenoid and medial humeral head cartilage thinning.  MYOCARDIAL PERFUSION IMAGING STUDY (LEXISCAN) performed on 08/26/2019 Normal left ventricular systolic function with a hyperdynamic LVEF of 76% No evidence of stress-induced myocardial ischemia or arrhythmia; no scintigraphic evidence of scar CT attenuation correction images with coronary calcifications in the LAD territory and aortic  atherosclerosis Low risk scan  TRANSTHORACIC ECHOCARDIOGRAM performed on 11/30/2013 Normal left ventricular systolic function Mild LVH no regional wall motion abnormalities Right ventricular size  and function normal Trivial pulmonary valve regurgitation Pulmonary artery normal size with a systolic pressure within normal range No pericardial effusion  LEFT HEART CATHETERIZATION AND CORONARY ANGIOGRAPHY performed on 06/03/2013 Normal left ventricular systolic function Multivessel CAD 100% diffuse stenosis at the site of previous stent placement in the mid LAD 40% stenosis of the mid RCA 100% stenosis from of the SVG-RPLS from the ascending aorta at the ostium Recommendations Medical management  CORONARY ARTERY BYPASS GRAFTING performed on 08/12/2011 Three-vessel CABG procedure LIMA-LAD SVG-RCA SVG-D1  Impression and Plan:  Jennifer [name] has been referred for pre-anesthesia review and clearance prior to her undergoing the planned anesthetic and procedural courses. Available labs, pertinent testing, and imaging results were personally reviewed by me in preparation for upcoming operative/procedural course. [facility] medical record has been updated following extensive record review and patient interview with PAT staff.   This patient has been appropriately cleared by cardiology with an overall ACCEPTABLE risk of significant perioperative cardiovascular complications. Based on clinical review performed today (12/18/22), barring any significant acute changes in the patient's overall condition, it is anticipated that she will be able to proceed with the planned surgical intervention. Any acute changes in clinical condition may necessitate her procedure being postponed and/or cancelled. Patient will meet with anesthesia team (MD and/or CRNA) on the day of her procedure for preoperative evaluation/assessment. Questions regarding anesthetic course will be fielded at that time.   Pre-surgical  instructions were reviewed with the patient during her PAT appointment, and questions were fielded to satisfaction by PAT clinical staff. She has been instructed on which medications that she will need to hold prior to surgery, as well as the ones that have been deemed safe/appropriate to take of the day of her procedure. As part of the general education provided by PAT, patient made aware both verbally and in writing, that she would need to abstain from the use of any illegal substances during her perioperative course.  She was advised that failure to follow the provided instructions could necessitate case cancellation or result serious perioperative complications up to and including death. Patient encouraged to contact PAT and/or her surgeon's office to discuss any questions or concerns that may arise prior to surgery; verbalized understanding.   Honor Loh, MSN, APRN, FNP-C, CEN [facility]  Peri-operative Services Nurse Practitioner Phone: [phone] Fax: [phone] 12/18/22 10:17 AM  NOTE: This note has been prepared using Dragon dictation software. Despite my best ability to proofread, there is always the potential that unintentional transcriptional errors may still occur from this process.

## 2022-12-19 ENCOUNTER — Other Ambulatory Visit: Payer: Self-pay

## 2022-12-19 ENCOUNTER — Ambulatory Visit: Payer: Medicare Other | Admitting: Urgent Care

## 2022-12-19 ENCOUNTER — Encounter
Admission: RE | Disposition: A | Discharge status: Home / Self Care | Payer: Self-pay | Source: Home / Self Care | Attending: Surgery

## 2022-12-19 ENCOUNTER — Ambulatory Visit: Payer: Medicare Other

## 2022-12-19 ENCOUNTER — Encounter: Payer: Self-pay | Admitting: Surgery

## 2022-12-19 ENCOUNTER — Ambulatory Visit
Admission: RE | Admit: 2022-12-19 | Discharge: 2022-12-19 | Disposition: A | Discharge status: Home / Self Care | Payer: Medicare Other | Attending: Surgery | Admitting: Surgery

## 2022-12-19 DIAGNOSIS — E1140 Type 2 diabetes mellitus with diabetic neuropathy, unspecified: Secondary | ICD-10-CM | POA: Diagnosis not present

## 2022-12-19 DIAGNOSIS — E785 Hyperlipidemia, unspecified: Secondary | ICD-10-CM | POA: Diagnosis not present

## 2022-12-19 DIAGNOSIS — M7551 Bursitis of right shoulder: Secondary | ICD-10-CM | POA: Diagnosis not present

## 2022-12-19 DIAGNOSIS — E1122 Type 2 diabetes mellitus with diabetic chronic kidney disease: Secondary | ICD-10-CM | POA: Insufficient documentation

## 2022-12-19 DIAGNOSIS — I2510 Atherosclerotic heart disease of native coronary artery without angina pectoris: Secondary | ICD-10-CM | POA: Diagnosis not present

## 2022-12-19 DIAGNOSIS — M797 Fibromyalgia: Secondary | ICD-10-CM | POA: Diagnosis not present

## 2022-12-19 DIAGNOSIS — M75111 Incomplete rotator cuff tear or rupture of right shoulder, not specified as traumatic: Secondary | ICD-10-CM | POA: Insufficient documentation

## 2022-12-19 DIAGNOSIS — M7521 Bicipital tendinitis, right shoulder: Secondary | ICD-10-CM | POA: Insufficient documentation

## 2022-12-19 DIAGNOSIS — F17290 Nicotine dependence, other tobacco product, uncomplicated: Secondary | ICD-10-CM | POA: Diagnosis not present

## 2022-12-19 DIAGNOSIS — F419 Anxiety disorder, unspecified: Secondary | ICD-10-CM | POA: Diagnosis not present

## 2022-12-19 DIAGNOSIS — J449 Chronic obstructive pulmonary disease, unspecified: Secondary | ICD-10-CM | POA: Insufficient documentation

## 2022-12-19 DIAGNOSIS — Z951 Presence of aortocoronary bypass graft: Secondary | ICD-10-CM | POA: Insufficient documentation

## 2022-12-19 DIAGNOSIS — E1165 Type 2 diabetes mellitus with hyperglycemia: Secondary | ICD-10-CM | POA: Diagnosis not present

## 2022-12-19 DIAGNOSIS — Z91148 Patient's other noncompliance with medication regimen for other reason: Secondary | ICD-10-CM | POA: Insufficient documentation

## 2022-12-19 DIAGNOSIS — Z7984 Long term (current) use of oral hypoglycemic drugs: Secondary | ICD-10-CM | POA: Insufficient documentation

## 2022-12-19 DIAGNOSIS — G894 Chronic pain syndrome: Secondary | ICD-10-CM | POA: Insufficient documentation

## 2022-12-19 DIAGNOSIS — M19011 Primary osteoarthritis, right shoulder: Secondary | ICD-10-CM | POA: Insufficient documentation

## 2022-12-19 DIAGNOSIS — G2581 Restless legs syndrome: Secondary | ICD-10-CM | POA: Diagnosis not present

## 2022-12-19 DIAGNOSIS — M25811 Other specified joint disorders, right shoulder: Secondary | ICD-10-CM | POA: Diagnosis not present

## 2022-12-19 DIAGNOSIS — I129 Hypertensive chronic kidney disease with stage 1 through stage 4 chronic kidney disease, or unspecified chronic kidney disease: Secondary | ICD-10-CM | POA: Diagnosis not present

## 2022-12-19 DIAGNOSIS — F319 Bipolar disorder, unspecified: Secondary | ICD-10-CM | POA: Diagnosis not present

## 2022-12-19 DIAGNOSIS — Z01812 Encounter for preprocedural laboratory examination: Secondary | ICD-10-CM

## 2022-12-19 DIAGNOSIS — N189 Chronic kidney disease, unspecified: Secondary | ICD-10-CM | POA: Diagnosis not present

## 2022-12-19 DIAGNOSIS — Z79899 Other long term (current) drug therapy: Secondary | ICD-10-CM | POA: Diagnosis not present

## 2022-12-19 DIAGNOSIS — M7581 Other shoulder lesions, right shoulder: Secondary | ICD-10-CM | POA: Insufficient documentation

## 2022-12-19 DIAGNOSIS — K219 Gastro-esophageal reflux disease without esophagitis: Secondary | ICD-10-CM | POA: Diagnosis not present

## 2022-12-19 DIAGNOSIS — Z794 Long term (current) use of insulin: Secondary | ICD-10-CM | POA: Insufficient documentation

## 2022-12-19 DIAGNOSIS — E039 Hypothyroidism, unspecified: Secondary | ICD-10-CM | POA: Diagnosis not present

## 2022-12-19 DIAGNOSIS — Z955 Presence of coronary angioplasty implant and graft: Secondary | ICD-10-CM | POA: Insufficient documentation

## 2022-12-19 DIAGNOSIS — M24111 Other articular cartilage disorders, right shoulder: Secondary | ICD-10-CM | POA: Insufficient documentation

## 2022-12-19 DIAGNOSIS — E1169 Type 2 diabetes mellitus with other specified complication: Secondary | ICD-10-CM

## 2022-12-19 HISTORY — DX: Opioid use, unspecified, uncomplicated: F11.90

## 2022-12-19 HISTORY — DX: Dysplasia of cervix uteri, unspecified: N87.9

## 2022-12-19 HISTORY — DX: Unspecified malignant neoplasm of skin of unspecified ear and external auricular canal: C44.201

## 2022-12-19 HISTORY — DX: Type 2 diabetes mellitus without complications: E11.9

## 2022-12-19 HISTORY — DX: Other forms of dyspnea: R06.09

## 2022-12-19 HISTORY — DX: Chronic pain syndrome: G89.4

## 2022-12-19 HISTORY — DX: Bipolar disorder, unspecified: F31.9

## 2022-12-19 HISTORY — DX: Restless legs syndrome: G25.81

## 2022-12-19 HISTORY — PX: SHOULDER ARTHROSCOPY WITH SUBACROMIAL DECOMPRESSION, ROTATOR CUFF REPAIR AND BICEP TENDON REPAIR: SHX5687

## 2022-12-19 HISTORY — DX: Type 2 diabetes mellitus without complications: Z79.4

## 2022-12-19 LAB — GLUCOSE, CAPILLARY
Glucose-Capillary: 195 mg/dL — ABNORMAL HIGH (ref 70–99)
Glucose-Capillary: 267 mg/dL — ABNORMAL HIGH (ref 70–99)

## 2022-12-19 SURGERY — SHOULDER ARTHROSCOPY WITH SUBACROMIAL DECOMPRESSION, ROTATOR CUFF REPAIR AND BICEP TENDON REPAIR
Anesthesia: Regional | Site: Shoulder | Laterality: Right

## 2022-12-19 MED ORDER — BUPIVACAINE LIPOSOME 1.3 % IJ SUSP
INTRAMUSCULAR | Status: DC | PRN
  Administered 2022-12-19: 20 mL

## 2022-12-19 MED ORDER — FENTANYL CITRATE (PF) 100 MCG/2ML IJ SOLN: 25.0000 ug | INTRAMUSCULAR | Status: DC | PRN

## 2022-12-19 MED ORDER — LIDOCAINE HCL (CARDIAC) PF 100 MG/5ML IV SOSY
PREFILLED_SYRINGE | INTRAVENOUS | Status: DC | PRN
  Administered 2022-12-19: 100 mg via INTRAVENOUS

## 2022-12-19 MED ORDER — EPINEPHRINE PF 1 MG/ML IJ SOLN
INTRAMUSCULAR | Status: AC
  Filled 2022-12-19: qty 3

## 2022-12-19 MED ORDER — OXYCODONE HCL 5 MG PO TABS
ORAL_TABLET | ORAL | Status: AC
  Filled 2022-12-19: qty 2

## 2022-12-19 MED ORDER — SUGAMMADEX SODIUM 200 MG/2ML IV SOLN
INTRAVENOUS | Status: DC | PRN
  Administered 2022-12-19: 200 mg via INTRAVENOUS

## 2022-12-19 MED ORDER — CARISOPRODOL 350 MG PO TABS: 350.0000 mg | ORAL_TABLET | Freq: Four times a day (QID) | ORAL | Status: AC

## 2022-12-19 MED ORDER — LACTATED RINGERS IV SOLN: INTRAVENOUS | Status: DC

## 2022-12-19 MED ORDER — BUPIVACAINE HCL (PF) 0.5 % IJ SOLN
INTRAMUSCULAR | Status: AC
  Filled 2022-12-19: qty 10

## 2022-12-19 MED ORDER — FENTANYL CITRATE (PF) 100 MCG/2ML IJ SOLN
INTRAMUSCULAR | Status: AC
  Filled 2022-12-19: qty 2

## 2022-12-19 MED ORDER — OXYCODONE HCL 10 MG PO TABS: 10.0000 mg | ORAL_TABLET | ORAL | 0 refills | Status: AC | PRN

## 2022-12-19 MED ORDER — ACETAMINOPHEN 10 MG/ML IV SOLN: 1000.0000 mg | Freq: Once | INTRAVENOUS | Status: DC | PRN

## 2022-12-19 MED ORDER — LACTATED RINGERS IV SOLN: INTRAVENOUS | Status: DC | PRN

## 2022-12-19 MED ORDER — CHLORHEXIDINE GLUCONATE 0.12 % MT SOLN
OROMUCOSAL | Status: AC
  Administered 2022-12-19: 15 mL via OROMUCOSAL
  Filled 2022-12-19: qty 15

## 2022-12-19 MED ORDER — CHLORHEXIDINE GLUCONATE 0.12 % MT SOLN: 15.0000 mL | Freq: Once | OROMUCOSAL | Status: AC

## 2022-12-19 MED ORDER — BUPIVACAINE HCL (PF) 0.5 % IJ SOLN
INTRAMUSCULAR | Status: DC | PRN
  Administered 2022-12-19: 10 mL

## 2022-12-19 MED ORDER — BUPIVACAINE LIPOSOME 1.3 % IJ SUSP
INTRAMUSCULAR | Status: AC
  Filled 2022-12-19: qty 20

## 2022-12-19 MED ORDER — FENTANYL CITRATE PF 50 MCG/ML IJ SOSY
PREFILLED_SYRINGE | INTRAMUSCULAR | Status: AC
  Administered 2022-12-19: 50 ug via INTRAVENOUS
  Filled 2022-12-19: qty 1

## 2022-12-19 MED ORDER — DEXMEDETOMIDINE HCL IN NACL 80 MCG/20ML IV SOLN
INTRAVENOUS | Status: DC | PRN
  Administered 2022-12-19: 8 ug via BUCCAL
  Administered 2022-12-19: 12 ug via BUCCAL

## 2022-12-19 MED ORDER — ORAL CARE MOUTH RINSE: 15.0000 mL | Freq: Once | OROMUCOSAL | Status: AC

## 2022-12-19 MED ORDER — MIDAZOLAM HCL 2 MG/2ML IJ SOLN
INTRAMUSCULAR | Status: AC
  Administered 2022-12-19: 1 mg via INTRAVENOUS
  Filled 2022-12-19: qty 2

## 2022-12-19 MED ORDER — KETOROLAC TROMETHAMINE 15 MG/ML IJ SOLN
INTRAMUSCULAR | Status: AC
  Administered 2022-12-19: 15 mg via INTRAVENOUS
  Filled 2022-12-19: qty 1

## 2022-12-19 MED ORDER — OXYCODONE HCL 5 MG/5ML PO SOLN: 5.0000 mg | Freq: Once | ORAL | Status: DC | PRN

## 2022-12-19 MED ORDER — METOPROLOL TARTRATE 5 MG/5ML IV SOLN
INTRAVENOUS | Status: DC | PRN
  Administered 2022-12-19 (×2): 2 mg via INTRAVENOUS

## 2022-12-19 MED ORDER — OXYCODONE HCL 5 MG PO TABS: 5.0000 mg | ORAL_TABLET | Freq: Once | ORAL | Status: DC | PRN

## 2022-12-19 MED ORDER — PROPOFOL 10 MG/ML IV BOLUS
INTRAVENOUS | Status: DC | PRN
  Administered 2022-12-19: 100 mg via INTRAVENOUS

## 2022-12-19 MED ORDER — ONDANSETRON HCL 4 MG/2ML IJ SOLN
INTRAMUSCULAR | Status: AC
  Filled 2022-12-19: qty 2

## 2022-12-19 MED ORDER — ROCURONIUM BROMIDE 100 MG/10ML IV SOLN
INTRAVENOUS | Status: DC | PRN
  Administered 2022-12-19: 50 mg via INTRAVENOUS

## 2022-12-19 MED ORDER — ONDANSETRON HCL 4 MG/2ML IJ SOLN
4.0000 mg | Freq: Once | INTRAMUSCULAR | Status: AC | PRN
  Administered 2022-12-19: 4 mg via INTRAVENOUS

## 2022-12-19 MED ORDER — CEFAZOLIN SODIUM-DEXTROSE 2-4 GM/100ML-% IV SOLN: 2.0000 g | INTRAVENOUS | Status: DC

## 2022-12-19 MED ORDER — FENTANYL CITRATE PF 50 MCG/ML IJ SOSY: 50.0000 ug | PREFILLED_SYRINGE | Freq: Once | INTRAMUSCULAR | Status: AC

## 2022-12-19 MED ORDER — LACTATED RINGERS IV SOLN
INTRAVENOUS | Status: DC | PRN
  Administered 2022-12-19: 3001 mL

## 2022-12-19 MED ORDER — MIDAZOLAM HCL 2 MG/2ML IJ SOLN: 1.0000 mg | INTRAMUSCULAR | Status: DC | PRN

## 2022-12-19 MED ORDER — KETOROLAC TROMETHAMINE 15 MG/ML IJ SOLN: 15.0000 mg | Freq: Once | INTRAMUSCULAR | Status: AC

## 2022-12-19 MED ORDER — BUPIVACAINE HCL (PF) 0.5 % IJ SOLN
INTRAMUSCULAR | Status: AC
  Filled 2022-12-19: qty 30

## 2022-12-19 MED ORDER — FENTANYL CITRATE (PF) 100 MCG/2ML IJ SOLN
INTRAMUSCULAR | Status: DC | PRN
  Administered 2022-12-19 (×2): 50 ug via INTRAVENOUS

## 2022-12-19 MED ORDER — DEXAMETHASONE SODIUM PHOSPHATE 10 MG/ML IJ SOLN
INTRAMUSCULAR | Status: DC | PRN
  Administered 2022-12-19: 10 mg via INTRAVENOUS

## 2022-12-19 MED ORDER — ONDANSETRON HCL 4 MG/2ML IJ SOLN
INTRAMUSCULAR | Status: DC | PRN
  Administered 2022-12-19: 4 mg via INTRAVENOUS

## 2022-12-19 MED ORDER — VASOPRESSIN 20 UNIT/ML IV SOLN
INTRAVENOUS | Status: DC | PRN
  Administered 2022-12-19: 2 [IU] via INTRAVENOUS

## 2022-12-19 MED ORDER — CEFAZOLIN SODIUM-DEXTROSE 2-4 GM/100ML-% IV SOLN
INTRAVENOUS | Status: AC
  Filled 2022-12-19: qty 100

## 2022-12-19 MED ORDER — SODIUM CHLORIDE 0.9 % IV SOLN: INTRAVENOUS | Status: DC

## 2022-12-19 MED ORDER — OXYCODONE HCL 5 MG PO TABS
10.0000 mg | ORAL_TABLET | ORAL | Status: DC | PRN
  Administered 2022-12-19: 10 mg via ORAL

## 2022-12-19 MED ORDER — BUPIVACAINE-EPINEPHRINE 0.5% -1:200000 IJ SOLN
INTRAMUSCULAR | Status: DC | PRN
  Administered 2022-12-19: 30 mL

## 2022-12-19 SURGICAL SUPPLY — 49 items
ANCHOR HEALICOIL REGEN 5.5 (Anchor) IMPLANT
ANCHOR JUGGERKNOT WTAP NDL 2.9 (Anchor) IMPLANT
ANCHOR SUT W/ ORTHOCORD (Anchor) IMPLANT
BIT DRILL JUGRKNT W/NDL BIT2.9 (DRILL) IMPLANT
BLADE FULL RADIUS 3.5 (BLADE) ×1 IMPLANT
BUR ACROMIONIZER 4.0 (BURR) ×1 IMPLANT
CANNULA SHAVER 8MMX76MM (CANNULA) ×1 IMPLANT
CHLORAPREP W/TINT 26 (MISCELLANEOUS) ×1 IMPLANT
COVER MAYO STAND REUSABLE (DRAPES) ×1 IMPLANT
DILATOR 5.5 THREADED HEALICOIL (MISCELLANEOUS) IMPLANT
DRILL JUGGERKNOT W/NDL BIT 2.9 (DRILL) ×1
ELECT CAUTERY BLADE 6.4 (BLADE) ×1 IMPLANT
ELECT REM PT RETURN 9FT ADLT (ELECTROSURGICAL) ×1
ELECTRODE REM PT RTRN 9FT ADLT (ELECTROSURGICAL) ×1 IMPLANT
GAUZE SPONGE 4X4 12PLY STRL (GAUZE/BANDAGES/DRESSINGS) ×1 IMPLANT
GAUZE XEROFORM 1X8 LF (GAUZE/BANDAGES/DRESSINGS) ×1 IMPLANT
GLOVE BIO SURGEON STRL SZ7.5 (GLOVE) ×2 IMPLANT
GLOVE BIO SURGEON STRL SZ8 (GLOVE) ×2 IMPLANT
GLOVE BIOGEL PI IND STRL 8 (GLOVE) ×1 IMPLANT
GLOVE SURG UNDER LTX SZ8 (GLOVE) ×1 IMPLANT
GOWN STRL REUS W/ TWL LRG LVL3 (GOWN DISPOSABLE) ×1 IMPLANT
GOWN STRL REUS W/ TWL XL LVL3 (GOWN DISPOSABLE) ×1 IMPLANT
GOWN STRL REUS W/TWL LRG LVL3 (GOWN DISPOSABLE) ×1
GOWN STRL REUS W/TWL XL LVL3 (GOWN DISPOSABLE) ×1
GRASPER SUT 15 45D LOW PRO (SUTURE) IMPLANT
IV LACTATED RINGER IRRG 3000ML (IV SOLUTION) ×2
IV LR IRRIG 3000ML ARTHROMATIC (IV SOLUTION) ×2 IMPLANT
KIT CANNULA 8X76-LX IN CANNULA (CANNULA) ×1 IMPLANT
MANIFOLD NEPTUNE II (INSTRUMENTS) ×2 IMPLANT
MASK FACE SPIDER DISP (MASK) ×1 IMPLANT
MAT ABSORB  FLUID 56X50 GRAY (MISCELLANEOUS) ×1
MAT ABSORB FLUID 56X50 GRAY (MISCELLANEOUS) ×1 IMPLANT
PACK ARTHROSCOPY SHOULDER (MISCELLANEOUS) ×1 IMPLANT
PAD ABD DERMACEA PRESS 5X9 (GAUZE/BANDAGES/DRESSINGS) ×2 IMPLANT
PASSER SUT FIRSTPASS SELF (INSTRUMENTS) IMPLANT
SLING ARM LRG DEEP (SOFTGOODS) ×1 IMPLANT
SLING ULTRA II LG (MISCELLANEOUS) ×1 IMPLANT
SPONGE T-LAP 18X18 ~~LOC~~+RFID (SPONGE) ×1 IMPLANT
STAPLER SKIN PROX 35W (STAPLE) ×1 IMPLANT
STRAP SAFETY 5IN WIDE (MISCELLANEOUS) ×1 IMPLANT
SUT ETHIBOND 0 MO6 C/R (SUTURE) ×1 IMPLANT
SUT ULTRABRAID 2 COBRAID 38 (SUTURE) IMPLANT
SUT VIC AB 2-0 CT1 27 (SUTURE) ×2
SUT VIC AB 2-0 CT1 TAPERPNT 27 (SUTURE) ×2 IMPLANT
TAPE MICROFOAM 4IN (TAPE) ×1 IMPLANT
TRAP FLUID SMOKE EVACUATOR (MISCELLANEOUS) ×1 IMPLANT
TUBE SET DOUBLEFLO INFLOW (TUBING) ×1 IMPLANT
TUBING CONNECTING 10 (TUBING) ×1 IMPLANT
WAND WEREWOLF FLOW 90D (MISCELLANEOUS) ×1 IMPLANT

## 2022-12-19 NOTE — Progress Notes (Signed)
Blood glucose checked 267:  Dr. Rosey Bath anesthesia made aware NO new orders. Instructed patient to take her medication when she returns home, verbalizes understanding.

## 2022-12-19 NOTE — Discharge Instructions (Addendum)
Orthopedic discharge instructions: Keep dressing dry and intact.  May shower after dressing changed on post-op day #4 (Monday).  Cover staples with Band-Aids after drying off. Apply ice frequently to shoulder. Take ibuprofen 600-800 mg TID with meals for 3-5 days, then as necessary. Take oxycodone as prescribed when needed.  May supplement with ES Tylenol if necessary. Keep shoulder immobilizer on at all times except may remove for bathing purposes. Follow-up in 10-14 days or as scheduled.   Interscalene Nerve Block (ISNB) Discharge Instructions    For your surgery you have received an Interscalene Nerve Block. Nerve Blocks affect many types of nerves, including nerves that control movement, pain and normal sensation.  You may experience feelings such as numbness, tingling, heaviness, weakness or the inability to move your arm or the feeling or sensation that your arm has "fallen asleep". A nerve block can last for 2 - 36 hours or more depending on the medication used.  Usually the weakness wears off first.  The tingling and heaviness usually wear off next.  Finally you may start to notice pain.  Keep in mind that this may occur in any order.  once a nerve block starts to wear off it is usually completely gone within 60 minutes. ISNB may cause mild shortness of breath, a hoarse voice, blurry vision, unequal pupils, or drooping of the face on the same side as the nerve block.  These symptoms will usually go away within 12 hours.  Very rarely the procedure itself can cause mild seizures. If needed, your surgeon will give you a prescription for pain medication.  It will take about 60 minutes for the oral pain medication to become fully effective.  So, it is recommended that you start taking this medication before the nerve block first begins to wear off, or when you first begin to feel discomfort. Keep in mind that nerve blocks often wear off in the middle of the night.   If you are going to bed and  the block has not started to wear off or you have not started to have any discomfort, consider setting an alarm for 2 to 3 hours, so you can assess your block.  If you notice the block is wearing off or you are starting to have discomfort, you can take your pain medication. Take your pain medication only as prescribed.  Pain medication can cause sedation and decrease your breathing if you take more than you need for the level of pain that you have. Nausea is a common side effect of many pain medications.  You may want to eat something before taking your pain medicine to prevent nausea. After an Interscalene nerve block, you cannot feel pain, pressure or extremes in temperature in the effected arm.  Because your arm is numb it is at an increased risk for injury.  To decrease the possibility of injury, please practice the following:  While you are awake change the position of your arm frequently to prevent too much pressure on any one area for prolonged periods of time.  If you have a cast or tight dressing, check the color or your fingers every couple of hours.  Call your surgeon with the appearance of any discoloration (white or blue). If you are given a sling to wear before you go home, please wear it  at all times until the block has completely worn off.  Do not get up at night without your sling. If you experience any problems or concerns, please contact your  surgeon's office. If you experience severe or prolonged shortness of breath go to the nearest emergency department.  DeRoyal Abductor sling instructions and diagram: (Blue ball)  Youtube video: https://youtu.be/[email or web address]  Please contact your surgeon's office if you have any questions about this shoulder immobilizer.        AMBULATORY SURGERY  DISCHARGE INSTRUCTIONS   The drugs that you were given will stay in your system until tomorrow so for the next 24 hours you should not:  Drive an automobile Make any legal  decisions Drink any alcoholic beverage   You may resume regular meals tomorrow.  Today it is better to start with liquids and gradually work up to solid foods.  You may eat anything you prefer, but it is better to start with liquids, then soup and crackers, and gradually work up to solid foods.   Please notify your doctor immediately if you have any unusual bleeding, trouble breathing, redness and pain at the surgery site, drainage, fever, or pain not relieved by medication.    Your post-operative visit with Dr.                                       is: Date:                        Time:    Please call to schedule your post-operative visit.  Additional Instructions: PLEASE LEAVE GREEN/TEAL BRACELET ON FOR 4 DAYS

## 2022-12-19 NOTE — Op Note (Signed)
12/19/2022  3:46 PM  Patient:   Jennifer [name]  Pre-Op Diagnosis:   Impingement/tendinopathy with partial-thickness rotator cuff tear and biceps tendinopathy, right shoulder.  Post-Op Diagnosis:   Impingement/tendinopathy with partial-thickness rotator cuff tear, degenerative labral fraying, early degenerative joint disease, and biceps tendinopathy, right shoulder.  Procedure:   Extensive arthroscopic debridement, arthroscopic repair of subscapularis tendon tear, arthroscopic subacromial decompression, mini-open rotator cuff repair, and mini-open biceps tenodesis, right shoulder.  Anesthesia:   General endotracheal with interscalene block using Exparel placed preoperatively by the anesthesiologist.  Surgeon:   Pascal Lux, MD  Assistant:   Cameron Proud, PA-C  Findings:   As above. There was moderate labral fraying involving the anterior, superior, and posterior superior portions of the labrum without frank detachment from the glenoid rim. There was a near full-thickness articular sided tear involving the superior insertional fibers of the subscapularis tendon, as well as a near full-thickness articular sided tear involving the mid insertional fibers of the supraspinatus tendon. The remainder of the rotator cuff was in satisfactory condition. There was significant biceps tendinopathy with partial-thickness tearing of the long head of the biceps tendon. The articular surface of the humerus demonstrated grade II-III chondromalacia changes centrally while the glenoid articular surface was in satisfactory condition.  Complications:   None  Fluids:   1400 cc  Estimated blood loss:   15 cc  Tourniquet time:   None  Drains:   None  Closure:   Staples      Brief clinical note:   The patient is a 67 year old female with a history of progressively worsening right shoulder pain. The patient's symptoms have progressed despite medications, activity modification, etc. The patient's history and  examination are consistent with impingement/tendinopathy with a rotator cuff tear. These findings were confirmed by MRI scan. The patient presents at this time for definitive management of these shoulder symptoms.  Procedure:   The patient underwent placement of an interscalene block using Exparel by the anesthesiologist in the preoperative holding area before being brought into the operating room and lain in the supine position. The patient then underwent general endotracheal intubation and anesthesia before being repositioned in the beach chair position using the beach chair positioner. The right shoulder and upper extremity were prepped with ChloraPrep solution before being draped sterilely. Preoperative antibiotics were administered. A timeout was performed to confirm the proper surgical site before the expected portal sites and incision site were injected with 0.5% Sensorcaine with epinephrine.   A posterior portal was created and the glenohumeral joint thoroughly inspected with the findings as described above. An anterior portal was created using an outside-in technique. The labrum and rotator cuff were further probed, again confirming the above-noted findings. The areas of labral fraying were debrided using the full-radius resector, as were areas of synovitis. The torn margins of the rotator cuff tears also were debrided back to stable margins using the full-radius resector. Finally, the area of unstable articular cartilage involving the central portion of the humeral head was debrided back to stable margins using the full-radius resector. The ArthroCare wand was inserted and used to release the biceps tendon from its labral anchor.  It also was used to obtain hemostasis as well as to "anneal" the labrum superiorly and anteriorly.   A separate superolateral portal site was created using an outside in technique in order to assist in the repair of the subscapularis tendon. The exposed portion of the  lesser tuberosity was roughened with a full-radius resector in order  to optimize the bone bed for reattachment. The subscapularis tendon tear was repaired using a Mitek BioKnotless anchor placed through the anterior portal. The adequacy of repair was assessed both by probing as well as with passive external rotation and found to be excellent. The instruments were removed from the joint after suctioning the excess fluid.  The camera was repositioned through the posterior portal into the subacromial space. A separate lateral portal was created using an outside-in technique. The 3.5 mm full-radius resector was introduced and used to perform a subtotal bursectomy. The ArthroCare wand was then inserted and used to remove the periosteal tissue off the undersurface of the anterior third of the acromion as well as to recess the coracoacromial ligament from its attachment along the anterior and lateral margins of the acromion. The 4.0 mm acromionizing bur was introduced and used to complete the decompression by removing the undersurface of the anterior third of the acromion. The full radius resector was reintroduced to remove any residual bony debris before the ArthroCare wand was reintroduced to obtain hemostasis. The instruments were then removed from the subacromial space after suctioning the excess fluid.  An approximately 4-5 cm incision was made over the anterolateral aspect of the shoulder beginning at the anterolateral corner of the acromion and extending distally in line with the bicipital groove. This incision was carried down through the subcutaneous tissues to expose the deltoid fascia. The raphae between the anterior and middle thirds was identified and this plane developed to provide access into the subacromial space. Additional bursal tissues were debrided sharply using Metzenbaum scissors. The rotator cuff tear was readily identified both visually and by palpation.   The few remaining fibers of the near  full-thickness supraspinatus tear were released using a #15 blade before the margins were debrided sharply with the same #15 blade.  The exposed greater tuberosity was roughened with a rongeur. The tear was repaired using one Biomet 2.9 mm JuggerKnot anchor. These sutures were then brought back laterally and secured using one [place] knotless RegeneSorb anchor to create a two-layer closure. An apparent watertight closure was obtained.  The bicipital groove was identified by palpation and opened for 1-1.5 cm. The biceps tendon stump, which was noted to be significantly torn and degenerative to of, was retrieved through this defect. The floor of the bicipital groove was roughened with a curet before another Biomet 2.9 mm JuggerKnot anchor was inserted. Both sets of sutures were passed through the biceps tendon and tied securely to effect the tenodesis. The bicipital sheath was reapproximated using two #0 Ethibond interrupted sutures, incorporating the biceps tendon to further reinforce the tenodesis.  The wound was copiously irrigated with sterile saline solution before the deltoid raphae was reapproximated using 2-0 Vicryl interrupted sutures. The subcutaneous tissues were closed in two layers using 2-0 Vicryl interrupted sutures before the skin was closed using staples. The portal sites also were closed using staples. A sterile bulky dressing was applied to the shoulder before the arm was placed into a shoulder immobilizer. The patient was then awakened, extubated, and returned to the recovery room in satisfactory condition after tolerating the procedure well.

## 2022-12-19 NOTE — Anesthesia Procedure Notes (Signed)
Anesthesia Regional Block: Interscalene brachial plexus block   Pre-Anesthetic Checklist: , timeout performed,  Correct Patient, Correct Site, Correct Laterality,  Correct Procedure,, risks and benefits discussed,  Surgical consent,  Pre-op evaluation,  At surgeon's request and post-op pain management  Laterality: Right  Prep: chloraprep       Needles:  Injection technique: Single-shot  Needle Type: Echogenic Needle          Additional Needles:   Procedures:,,,, ultrasound used (permanent image in chart),,   Motor weakness within 20 minutes.  Narrative:  Start time: 12/19/2022 11:04 AM End time: 12/19/2022 11:07 AM Injection made incrementally with aspirations every 5 mL.  Performed by: Personally  Anesthesiologist: Darrin Nipper, MD  Additional Notes: Functioning IV was confirmed and monitors applied.  Sterile prep and drape, hand hygiene and sterile gloves were used. Ultrasound guidance: relevant anatomy identified, needle position confirmed, local anesthetic spread visualized around nerve(s), vascular puncture avoided.  Image saved to electronic medical record.  Negative aspiration prior to incremental administration of local anesthetic for total 20 ml Exparel and 10 ml bupivacaine 0.5% given in interscalene distribution. The patient tolerated the procedure well. Vital signs and moderate sedation medications recorded in RN notes.

## 2022-12-19 NOTE — H&P (Signed)
History of Present Illness:  Jennifer [name] is a 67 y.o. female who presents today as a result of a call to our clinic for right shoulder pain.   The patient's symptoms began over a year ago and developed without any specific cause or injury . Initially, the patient saw her primary care provider who gave her several steroid injections, then referred her to orthopedics. She saw Cassell Smiles, PA-C, for these symptoms 2 months ago. The patient was sent for an MRI scan and referred to me for further evaluation and treatment. The patient describes the symptoms as moderate (patient is active but has had to make modifications or give up activities) and have the quality of being aching, nagging, stabbing, tender, and throbbing. The pain is localized to the lateral arm/shoulder and localized to the anterior shoulder. These symptoms are aggravated with normal daily activities, with sleeping, carrying heavy objects, at higher levels of activity, with overhead activity, reaching behind the back, and getting dressed. She has tried acetaminophen, non-steroidal anti-inflammatories (Advil and Voltaren gel), and narcotics with temporary partial relief of her symptoms. She has tried rest with no significant benefit. She has tried several injections as described above with limited benefit. The patient denies any neck pain, but does note occasional numbness and paresthesias to her fingers. She is right-hand dominant. This complaint is not work related. She is a sports non-participant.  Shoulder Surgical History:  The patient has had no shoulder surgery in the past.  PMH/PSH/Family History/Social History/Meds/Allergies:  I have reviewed past medical, surgical, social and family history, medications and allergies as documented in the EMR.  Current Outpatient Medications: albuterol 90 mcg/actuation inhaler Inhale 2 inhalations into the lungs every 4 (four) hours as needed 18 g 1  amLODIPine (NORVASC) 10 MG tablet Take 1 tablet  (10 mg total) by mouth once daily 90 tablet 2  atorvastatin (LIPITOR) 80 MG tablet Take 1 tablet (80 mg total) by mouth once daily 90 tablet 2  blood glucose diagnostic (FREESTYLE LITE STRIPS) test strip 3 (three) times daily 300 each 2  blood glucose meter (FREESTYLE FREEDOM LITE) kit Use 3 (three) times daily For poorly controlled Type 2 DM on Insulin. Dx Code: 250.00 1 each 0  carisoprodoL (SOMA) 350 MG tablet Take 350 mg by mouth 3 (three) times daily as needed for Muscle spasms  carvediloL (COREG) 12.5 MG tablet Take 1 tablet (12.5 mg total) by mouth 2 (two) times daily with meals 180 tablet 1  diclofenac (VOLTAREN) 1 % topical gel Apply 2 g topically 2 (two) times daily as needed  divalproex (DEPAKOTE ER) 500 MG ER tablet Take 750 mg by mouth once daily  doxepin (SINEQUAN) 10 MG capsule Take 60 mg by mouth at bedtime  ezetimibe (ZETIA) 10 mg tablet TAKE 1 TABLET DAILY 90 tablet 3  flash glucose scanning (FREESTYLE LIBRE 2 READER) reader Use 1 Device as directed 1 each 1  flash glucose sensor (FREESTYLE LIBRE 2 SENSOR) kit for glucose monitoring, use as directed 3 kit 3  FLUoxetine (PROZAC) 20 MG capsule Take 20 mg by mouth once daily  inhalational spacer (E-Z SPACER) spacer Use as instructed. 1 each 0  insulin NPH-REGULAR (HUMULIN 70/30 U-100 KWIKPEN) 100 unit/mL (70-30) pen injector INJECT 30 UNITS IN THE MORNING AND 30 UNITS IN THE EVENING BEFORE BREAKFAST AND SUPPER. 30 mL 5  IPRATROPIUM/ALBUTEROL SULFATE (COMBIVENT INHAL) Inhale into the lungs  levothyroxine (SYNTHROID) 25 MCG tablet TAKE 1 TABLET ONCE DAILY TAKE ON AN EMPTY STOMACH WITH A  GLASS OF WATER AT LEAST 30 TO 60 MINUTES BEFORE BREAKFAST 90 tablet 2  lidocaine (LIDODERM) 5 % patch as directed  metFORMIN (GLUCOPHAGE-XR) 750 MG XR tablet Take 1 tablet (750 mg total) by mouth 2 (two) times daily with meals 180 tablet 2  NARCAN 4 mg/actuation nasal spray  nitroGLYcerin (NITROSTAT) 0.4 MG SL tablet As directed 25 tablet 11   omeprazole (PRILOSEC) 20 MG DR capsule Take 1 capsule (20 mg total) by mouth once daily 30 capsule 11  oxyCODONE (DAZIDOX) 10 mg immediate release tablet LIMIT 1 2 TO 1 (ONE HALF TO ONE) TABLET BY MOUTH 4 6 TIMES DAILY IF TOLERATED NO HYDROCODONE ACETAMINOPHEN  pen needle, diabetic 31 gauge x 5/16" needle as directed 100 each 11  pregabalin (LYRICA) 75 MG capsule Take 1 capsule (75 mg total) by mouth 2 (two) times daily Increase dose to two tabs by mouth twice a day after one week 60 capsule 3  rOPINIRole (REQUIP) 0.5 MG tablet Take 2 tablets (1 mg total) by mouth once daily 180 tablet 2  SITagliptin phosphate (JANUVIA) 100 MG tablet Take 1 tablet (100 mg total) by mouth once daily 90 tablet 3  telmisartan (MICARDIS) 80 MG tablet Take 1 tablet (80 mg total) by mouth once daily 90 tablet 2  tolterodine (DETROL LA) 4 MG LA capsule TAKE 1 CAPSULE DAILY 90 capsule 3  estradioL (ESTRING) 2 mg (7.5 mcg /24 hour) vaginal ring Place 1 ring (2 mg total) vaginally every 3 (three) months 1 ring 3  fluticasone propionate (FLONASE) 50 mcg/actuation nasal spray Place 1 spray into both nostrils 2 (two) times daily 16 g 2   Allergies:  Aspirin Blood Disorder   Past Medical History:  Bipolar affective disorder  CAD (coronary atherosclerotic disease)  Chronic back pain  Depression  History of motion sickness  Hyperlipidemia  Hypertension  Hypothyroidism  Myocardial infarction (CMS-HCC)  Type 2 diabetes mellitus (CMS-HCC)  Von Willebrand disease (CMS-HCC)   Past Surgical History:  TAHBSO 1985  BREAST IMPLANTS Bilateral 1987  BREAST IMPLANT REMOVAL Bilateral 1993  POSTERIOR LUMBAR SPINE FUSION ONE LEVEL 2007  CORONARY ARTERY BYPASS GRAFT 2012  COLONOSCOPY W/BIOPSY N/A 09/11/2015  Procedure: SCREENING COLONOSCOPY with MAC; Surgeon: Erin Sons, MD; Location: [facility]; Service: Gastroenterology; Laterality: N/A;  back surgery 11/2016  CARDIAC SURGERY  COLONOSCOPY  HYSTERECTOMY   STENTS 05/2010, 07/2011   Family History:  Diabetes type II Mother  Diabetes type II Father  Anesthesia problems Father (slow to wake up) Coronary Artery Disease Brother  Colon cancer Brother 61  Malignant hypertension Neg Hx   Social History:   Socioeconomic History:  Marital status: Married  Tobacco Use  Smoking status: Former  Packs/day: 1.50  Years: 26.00  Additional pack years: 0.00  Total pack years: 39.00  Types: Cigarettes  Quit date: 06/20/2010  Years since quitting: 12.4  Smokeless tobacco: Never  Tobacco comments:  quit cig in 2011/currently uses 65m vape  Vaping Use  Vaping Use: Every day  Substance and Sexual Activity  Alcohol use: No  Alcohol/week: 0.0 standard drinks of alcohol  Drug use: No  Sexual activity: Not Currently   Review of Systems:  A comprehensive 14 point ROS was performed, reviewed, and the pertinent orthopaedic findings are documented in the HPI.  Physical Exam:  Vitals:  11/22/22 0907  BP: 116/78  Weight: 74.8 kg (165 lb)  Height: 160 cm (5' 3"$ )  PainSc: 6  PainLoc: Shoulder   General/Constitutional: The patient appears to be well-nourished,  well-developed, and in no acute distress. Neuro/Psych: Normal mood and affect, oriented to person, place and time. Eyes: Non-icteric. Pupils are equal, round, and reactive to light, and exhibit synchronous movement. ENT: Unremarkable. Lymphatic: No palpable adenopathy. Respiratory: Lungs clear to auscultation, Normal chest excursion, No wheezes, and Non-labored breathing Cardiovascular: Regular rate and rhythm. No murmurs. and No edema, swelling or tenderness, except as noted in detailed exam. Integumentary: No impressive skin lesions present, except as noted in detailed exam. Musculoskeletal: Unremarkable, except as noted in detailed exam.  Right shoulder exam: SKIN: normal SWELLING: none WARMTH: none LYMPH NODES: no adenopathy palpable CREPITUS: none TENDERNESS: Mildly tender over  anterolateral shoulder ROM (active):  Forward flexion: 140 degrees Abduction: 95 degrees Internal rotation: Right PSIS ROM (passive):  Forward flexion: 155 degrees Abduction: 135 degrees ER/IR at 90 abd: 80 degrees / 50 degrees  She notes mild-moderate pain at the extremes of all motions.  STRENGTH: Forward flexion: 4-4+/5 Abduction: 4-4+/5 External rotation: 4-4+/5 Internal rotation: 4+/5 Pain with RC testing: Mild-moderate pain with resisted forward flexion and abduction, and mild pain with resisted external rotation  STABILITY: Normal  SPECIAL TESTS: Luan Pulling' test: positive, moderate Speed's test: positive Capsulitis - pain w/ passive ER: no Crossed arm test: Mildly positive Crank: Not evaluated Anterior apprehension: Negative Posterior apprehension: Not evaluated  She is neurovascularly intact to the right upper extremity.  Shoulder Imaging, MRI: Right Shoulder: MRI Shoulder Cartilage: Partial thickness humeral head cartilage loss. Partial thickness glenoid cartilage loss. MRI Shoulder Rotator Cuff: Partial thickness tear of the supraspinatus and subscapularis tendons. No retraction. MRI Shoulder Labrum / Biceps: Biceps tendinopathy. MRI Shoulder Bone: Normal bone.  Both the films and report were reviewed by myself and discussed with the patient and her husband.  Assessment:  1. Rotator cuff tendinitis, right.  2. Nontraumatic incomplete tear of right rotator cuff.  3. Biceps tendinitis of right upper extremity.  4. Uncontrolled type 2 diabetes mellitus with hyperglycemia.  Plan:  The treatment options were discussed with the patient and her husband. In addition, patient educational materials were provided regarding the diagnosis and treatment options. The patient is quite frustrated by her symptoms and functional limitations and is ready to consider more aggressive treatment options. Therefore, I have recommended a surgical procedure, specifically a right shoulder  arthroscopy with debridement, decompression, rotator cuff repair, and biceps tenodesis. The procedure was discussed with the patient, as were the potential risks (including bleeding, infection, nerve and/or blood vessel injury, persistent or recurrent pain, failure of the repair, progression of arthritis, need for further surgery, blood clots, strokes, heart attacks and/or arhythmias, pneumonia, etc.) and benefits. The patient states her understanding and wishes to proceed. All of the patient's questions and concerns were answered. She can call any time with further concerns. She will follow up post-surgery, routine.    H&P reviewed and patient re-examined. No changes.

## 2022-12-19 NOTE — Anesthesia Procedure Notes (Signed)
Procedure Name: Intubation Date/Time: 12/19/2022 1:39 PM  Performed by: Patience Musca., CRNAPre-anesthesia Checklist: Patient identified, Patient being monitored, Timeout performed, Emergency Drugs available and Suction available Patient Re-evaluated:Patient Re-evaluated prior to induction Oxygen Delivery Method: Circle system utilized Preoxygenation: Pre-oxygenation with 100% oxygen Induction Type: IV induction Ventilation: Mask ventilation without difficulty Laryngoscope Size: 3 and McGraph Grade View: Grade I Tube type: Oral Tube size: 7.0 mm Number of attempts: 1 Airway Equipment and Method: Stylet Placement Confirmation: ETT inserted through vocal cords under direct vision, positive ETCO2 and breath sounds checked- equal and bilateral Secured at: 21 cm Tube secured with: Tape Dental Injury: Teeth and Oropharynx as per pre-operative assessment

## 2022-12-19 NOTE — Transfer of Care (Signed)
Immediate Anesthesia Transfer of Care Note  Patient: Jennifer [name]  Procedure(s) Performed: SHOULDER ARTHROSCOPY WITH DEBRIDEMENT, DECOMPRESSION, ROTATOR CUFF REPAIR AND BICEP TENDON REPAIR (Right: Shoulder)  Patient Location: PACU  Anesthesia Type:General  Level of Consciousness: awake, alert , and oriented  Airway & Oxygen Therapy: Patient Spontanous Breathing and Patient connected to face mask oxygen  Post-op Assessment: Report given to RN and Post -op Vital signs reviewed and stable  Post vital signs: Reviewed and stable  Last Vitals:  Vitals Value Taken Time  BP 128/79 12/19/22 1545  Temp    Pulse 66 12/19/22 1551  Resp 14 12/19/22 1551  SpO2 90 % 12/19/22 1551  Vitals shown include unvalidated device data.  Last Pain:  Vitals:   12/19/22 1013  TempSrc: Oral  PainSc: 7          Complications: There were no known notable events for this encounter.

## 2022-12-19 NOTE — Anesthesia Postprocedure Evaluation (Signed)
Anesthesia Post Note  Patient: Jennifer [name]  Procedure(s) Performed: SHOULDER ARTHROSCOPY WITH DEBRIDEMENT, DECOMPRESSION, ROTATOR CUFF REPAIR AND BICEP TENDON REPAIR (Right: Shoulder)  Patient location during evaluation: PACU Anesthesia Type: General Level of consciousness: awake and alert Pain management: pain level controlled Vital Signs Assessment: post-procedure vital signs reviewed and stable Respiratory status: spontaneous breathing, nonlabored ventilation, respiratory function stable and patient connected to nasal cannula oxygen Cardiovascular status: blood pressure returned to baseline and stable Postop Assessment: no apparent nausea or vomiting Anesthetic complications: no   There were no known notable events for this encounter.   Last Vitals:  Vitals:   12/19/22 1615 12/19/22 1638  BP: 138/82 133/72  Pulse: 71 71  Resp: 20 18  Temp:  36.8 C  SpO2: 93% 94%    Last Pain:  Vitals:   12/19/22 1638  TempSrc: Oral  PainSc: 0-No pain                 Martha Clan

## 2022-12-19 NOTE — Anesthesia Preprocedure Evaluation (Addendum)
Anesthesia Evaluation  Patient identified by MRN, date of birth, ID band Patient awake    Reviewed: Allergy & Precautions, NPO status , Patient's Chart, lab work & pertinent test results  History of Anesthesia Complications Negative for: history of anesthetic complications  Airway Mallampati: I   Neck ROM: Full    Dental  (+) Missing, Chipped   Pulmonary COPD, former smoker (quit 2011) Current vaping   Pulmonary exam normal breath sounds clear to auscultation       Cardiovascular hypertension, + CAD (s/p CABG and stents)  Normal cardiovascular exam Rhythm:Regular Rate:Normal  ECG 07/26/22:  Normal sinus rhythm rate 71 bpm no significant ST-T wave changes  Myocardial perfusion 08/26/19:  Pharmacological myocardial perfusion imaging study with no significant ischemia Normal wall motion, EF estimated at 76% No EKG changes concerning for ischemia at peak stress or in recovery. CT attenuation correction images with coronary calcification in the LAD, aortic atherosclerosis Low risk scan   Neuro/Psych  Headaches PSYCHIATRIC DISORDERS Anxiety Depression Bipolar Disorder   Chronic pain, CRPS  Neuromuscular disease (neuropathy)    GI/Hepatic ,GERD  Medicated,,  Endo/Other  diabetes, Poorly Controlled, Type 2Hypothyroidism    Renal/GU      Musculoskeletal  (+) Arthritis ,  Fibromyalgia -  Abdominal   Peds  Hematology  (+) Blood dyscrasia, anemia   Anesthesia Other Findings Reviewed and agree with Bayard Males pre-anesthesia clinical review note.    Cardiology note 07/26/22:  Coronary Artery disease with chronic stable Chronic chest pain symptoms often exacerbated by stress Low risk myoview 10/20 No further workup at this time. Continue current medication regimen.   Hyperlipidemia Continue Lipitor with Zetia daily We will order lipid panel today as she is n.p.o.   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) Poorly controlled diabetes secondary to medication non compliance A1c greater than 8 Recommend she follow closely with primary care   Depression/anxiety Long history of medication noncompliance Reports having stress at home, fighting with her husband Followed by psychiatry, pain clinic   Reproductive/Obstetrics                             Anesthesia Physical Anesthesia Plan  ASA: 3  Anesthesia Plan: General and Regional   Post-op Pain Management: Regional block*   Induction: Intravenous  PONV Risk Score and Plan: 3 and Ondansetron, Dexamethasone and Treatment may vary due to age or medical condition  Airway Management Planned: Oral ETT  Additional Equipment:   Intra-op Plan:   Post-operative Plan: Extubation in OR  Informed Consent: I have reviewed the patients History and Physical, chart, labs and discussed the procedure including the risks, benefits and alternatives for the proposed anesthesia with the patient or authorized representative who has indicated his/her understanding and acceptance.     Dental advisory given  Plan Discussed with: CRNA  Anesthesia Plan Comments: (Plan for preoperative interscalene nerve block and GETA.  Patient consented for risks of anesthesia including but not limited to:  - adverse reactions to medications - damage to eyes, teeth, lips or other oral mucosa - nerve damage due to positioning  - sore throat or hoarseness - damage to heart, brain, nerves, lungs, other parts of body or loss of life  Informed patient about role of CRNA in peri- and intra-operative care.  Patient voiced understanding.)        Anesthesia Quick Evaluation

## 2022-12-20 ENCOUNTER — Encounter: Payer: Self-pay | Admitting: Surgery

## 2022-12-31 ENCOUNTER — Ambulatory Visit: Payer: Medicare Other | Admitting: Psychiatry

## 2023-06-25 ENCOUNTER — Telehealth: Payer: Self-pay

## 2023-06-25 DIAGNOSIS — F5105 Insomnia due to other mental disorder: Secondary | ICD-10-CM

## 2023-06-25 DIAGNOSIS — F411 Generalized anxiety disorder: Secondary | ICD-10-CM

## 2023-06-25 DIAGNOSIS — F3131 Bipolar disorder, current episode depressed, mild: Secondary | ICD-10-CM

## 2023-06-25 NOTE — Telephone Encounter (Signed)
pt called states she needs enough medication to get to her next appt please send in the fluoxetine, doxepin and the divalproexs . pt was last seen on 1-17 next appt 9-17

## 2023-06-25 NOTE — Telephone Encounter (Signed)
Is the pharmacy still [facility] ?

## 2023-06-26 MED ORDER — DIVALPROEX SODIUM ER 250 MG PO TB24
250.0000 mg | ORAL_TABLET | Freq: Every day | ORAL | 0 refills | Status: DC
Start: 2023-06-26 — End: 2023-09-10

## 2023-06-26 MED ORDER — FLUOXETINE HCL 20 MG PO CAPS
20.0000 mg | ORAL_CAPSULE | Freq: Every day | ORAL | 0 refills | Status: DC
Start: 2023-06-26 — End: 2023-09-10

## 2023-06-26 MED ORDER — DIVALPROEX SODIUM ER 500 MG PO TB24: 500.0000 mg | ORAL_TABLET | Freq: Every day | ORAL | 0 refills | Status: DC

## 2023-06-26 MED ORDER — DOXEPIN HCL 10 MG PO CAPS: ORAL_CAPSULE | ORAL | 0 refills | Status: AC

## 2023-06-26 NOTE — Telephone Encounter (Signed)
I have sent fluoxetine, doxepin, Depakote to pharmacy at [facility].

## 2023-06-26 NOTE — Telephone Encounter (Signed)
pt called back states [facility] to [facility] in [place]

## 2023-06-26 NOTE — Telephone Encounter (Signed)
left message to call office back.  

## 2023-06-27 NOTE — Telephone Encounter (Signed)
Pt.notified

## 2023-07-07 ENCOUNTER — Other Ambulatory Visit: Payer: Self-pay

## 2023-07-07 ENCOUNTER — Emergency Department: Payer: Medicare Other

## 2023-07-07 ENCOUNTER — Emergency Department
Admission: EM | Admit: 2023-07-07 | Discharge: 2023-07-07 | Disposition: A | Discharge status: Home / Self Care | Payer: Medicare Other | Attending: Emergency Medicine | Admitting: Emergency Medicine

## 2023-07-07 ENCOUNTER — Encounter: Payer: Self-pay | Admitting: Emergency Medicine

## 2023-07-07 DIAGNOSIS — E1140 Type 2 diabetes mellitus with diabetic neuropathy, unspecified: Secondary | ICD-10-CM | POA: Insufficient documentation

## 2023-07-07 DIAGNOSIS — Y900 Blood alcohol level of less than 20 mg/100 ml: Secondary | ICD-10-CM | POA: Insufficient documentation

## 2023-07-07 DIAGNOSIS — Z955 Presence of coronary angioplasty implant and graft: Secondary | ICD-10-CM | POA: Diagnosis not present

## 2023-07-07 DIAGNOSIS — E1122 Type 2 diabetes mellitus with diabetic chronic kidney disease: Secondary | ICD-10-CM | POA: Diagnosis not present

## 2023-07-07 DIAGNOSIS — J449 Chronic obstructive pulmonary disease, unspecified: Secondary | ICD-10-CM | POA: Diagnosis not present

## 2023-07-07 DIAGNOSIS — R600 Localized edema: Secondary | ICD-10-CM | POA: Diagnosis not present

## 2023-07-07 DIAGNOSIS — N189 Chronic kidney disease, unspecified: Secondary | ICD-10-CM | POA: Insufficient documentation

## 2023-07-07 DIAGNOSIS — R519 Headache, unspecified: Secondary | ICD-10-CM | POA: Diagnosis not present

## 2023-07-07 DIAGNOSIS — M7989 Other specified soft tissue disorders: Secondary | ICD-10-CM | POA: Diagnosis present

## 2023-07-07 DIAGNOSIS — I2510 Atherosclerotic heart disease of native coronary artery without angina pectoris: Secondary | ICD-10-CM | POA: Insufficient documentation

## 2023-07-07 DIAGNOSIS — I129 Hypertensive chronic kidney disease with stage 1 through stage 4 chronic kidney disease, or unspecified chronic kidney disease: Secondary | ICD-10-CM | POA: Diagnosis not present

## 2023-07-07 LAB — COMPREHENSIVE METABOLIC PANEL
ALT: 14 U/L (ref 0–44)
AST: 19 U/L (ref 15–41)
Albumin: 4.2 g/dL (ref 3.5–5.0)
Alkaline Phosphatase: 71 U/L (ref 38–126)
Anion gap: 12 (ref 5–15)
BUN: 13 mg/dL (ref 8–23)
CO2: 26 mmol/L (ref 22–32)
Calcium: 9 mg/dL (ref 8.9–10.3)
Chloride: 100 mmol/L (ref 98–111)
Creatinine, Ser: 0.67 mg/dL (ref 0.44–1.00)
GFR, Estimated: >60 mL/min (ref >60)
Glucose, Bld: 198 mg/dL — ABNORMAL HIGH (ref 70–99)
Potassium: 4.1 mmol/L (ref 3.5–5.1)
Sodium: 138 mmol/L (ref 135–145)
Total Bilirubin: 0.6 mg/dL (ref 0.3–1.2)
Total Protein: 7.1 g/dL (ref 6.5–8.1)

## 2023-07-07 LAB — CBC
HCT: 36.6 % (ref 36.0–46.0)
Hemoglobin: 11.6 g/dL — ABNORMAL LOW (ref 12.0–15.0)
MCH: 28.7 pg (ref 26.0–34.0)
MCHC: 31.7 g/dL (ref 30.0–36.0)
MCV: 90.6 fL (ref 80.0–100.0)
Platelets: 262 10*3/uL (ref 150–400)
RBC: 4.04 MIL/uL (ref 3.87–5.11)
RDW: 12.6 % (ref 11.5–15.5)
WBC: 6.4 10*3/uL (ref 4.0–10.5)
nRBC: 0 % (ref 0.0–0.2)

## 2023-07-07 LAB — DIFFERENTIAL
Abs Immature Granulocytes: 0.03 10*3/uL (ref 0.00–0.07)
Basophils Absolute: 0 10*3/uL (ref 0.0–0.1)
Basophils Relative: 0 %
Eosinophils Absolute: 0.2 10*3/uL (ref 0.0–0.5)
Eosinophils Relative: 3 %
Immature Granulocytes: 1 %
Lymphocytes Relative: 27 %
Lymphs Abs: 1.7 10*3/uL (ref 0.7–4.0)
Monocytes Absolute: 0.4 10*3/uL (ref 0.1–1.0)
Monocytes Relative: 6 %
Neutro Abs: 4.1 10*3/uL (ref 1.7–7.7)
Neutrophils Relative %: 63 %

## 2023-07-07 LAB — TROPONIN I (HIGH SENSITIVITY): Troponin I (High Sensitivity): 5 ng/L (ref <18)

## 2023-07-07 LAB — ETHANOL: Alcohol, Ethyl (B): <10 mg/dL (ref <10)

## 2023-07-07 MED ORDER — SODIUM CHLORIDE 0.9% FLUSH
3.0000 mL | Freq: Once | INTRAVENOUS | Status: AC
  Administered 2023-07-07: 3 mL via INTRAVENOUS

## 2023-07-07 MED ORDER — MORPHINE SULFATE (PF) 4 MG/ML IV SOLN
4.0000 mg | Freq: Once | INTRAVENOUS | Status: AC
  Administered 2023-07-07: 4 mg via INTRAMUSCULAR
  Filled 2023-07-07: qty 1

## 2023-07-07 MED ORDER — MEDICAL COMPRESSION STOCKINGS MISC: 0 refills | Status: AC

## 2023-07-07 NOTE — ED Provider Notes (Signed)
[facility] Provider Note    Event Date/Time   First MD Initiated Contact with Patient 07/07/23 1501     (approximate)  History   Chief Complaint: Hand Pain  HPI  Jennifer [name] is a 67 y.o. female with a past medical history of angina, bipolar, CAD status post stents, CABG, COPD, CKD, hypertension, hyperlipidemia, diabetes, presents to the emergency department for complaints of pain in her right upper extremity as well as swelling and discomfort in her right lower extremity.  According to the patient she had right shoulder surgery performed in February, for the past month or more she has been experiencing sharp pains at times in her first second and third fingers.  States never in her fourth or fifth fingers.  States the pain is worse in the morning when she first starts moving the hand but improves throughout the day.  Patient states a history of chronic pain, is prescribed oxycodone chronically at home.  Patient states her main concern is over the past 1 week she has been experiencing shooting pains in the right lower extremity as well as lower extremity edema which she states is new.  Patient did have her CABG vein harvest from the right lower extremity in 2012.  Patient denies any chest pain or shortness of breath.  Physical Exam   Triage Vital Signs: ED Triage Vitals  Encounter Vitals Group     BP 07/07/23 1238 117/67     Systolic BP Percentile --      Diastolic BP Percentile --      Pulse Rate 07/07/23 1238 70     Resp --      Temp 07/07/23 1238 97.8 F (36.6 C)     Temp Source 07/07/23 1238 Oral     SpO2 07/07/23 1238 97 %     Weight 07/07/23 1220 149 lb (67.6 kg)     Height 07/07/23 1220 5\' 3"  (1.6 m)     Head Circumference --      Peak Flow --      Pain Score 07/07/23 1220 4     Pain Loc --      Pain Education --      Exclude from Growth Chart --     Most recent vital signs: Vitals:   07/07/23 1238  BP: 117/67  Pulse: 70  Temp: 97.8 F  (36.6 C)  SpO2: 97%    General: Awake, no distress.  CV:  Good peripheral perfusion.  Regular rate and rhythm  Resp:  Normal effort.  Equal breath sounds bilaterally.  Abd:  No distention.  Soft, nontender.  No rebound or guarding. Other:  Reassuring physical exam the right upper extremity, no swelling no erythema, 2+ radial pulse.  Patient's description of the sharp shooting pains in the first second and third fingers are consistent with the median nerve distribution, possibly related to her chronic shoulder pain.  In the right lower extremity patient does have 1+ edema, no edema noted left lower extremity.  Patient does have calf tenderness to palpation.  1+ DP pulse, extremities warm/normal to the touch throughout.  Sensation intact.   ED Results / Procedures / Treatments   EKG  EKG viewed and interpreted by myself shows a normal sinus rhythm at 68 bpm with a narrow QRS, normal axis, normal intervals, no concerning ST changes.  RADIOLOGY  I have reviewed and interpreted the CT images of the head.  No bleed seen on my evaluation.   MEDICATIONS ORDERED  IN ED: Medications  sodium chloride flush (NS) 0.9 % injection 3 mL (has no administration in time range)     IMPRESSION / MDM / ASSESSMENT AND PLAN / ED COURSE  I reviewed the triage vital signs and the nursing notes.  Patient's presentation is most consistent with acute presentation with potential threat to life or bodily function.  Patient presents the emergency department with complaints of pain in her right upper extremity x 1 month described as sharp and shooting appears to be in the median nerve distribution suspect likely peripheral nerve injury or issue.  Asked the patient to follow-up with her orthopedist for further evaluation.  Patient is already on chronic oxycodone.  Given the patient's right lower extremity edema which she states is new x 1 week we will obtain a right lower extremity ultrasound to rule out DVT, this is  the leg that has the vein harvest from her CABG in 2012.  Peripheral edema could be related to poor venous return, patient could benefit from compression stockings which she does not currently wear.  Patient's lab work has resulted showing a reassuring chemistry, negative alcohol, reassuring CBC with a normal white blood cell count.  I have added on a troponin given the patient's cardiac history and right arm discomfort as a precaution.  Ultrasound pending.  Patient's troponin has resulted negative.  Ultrasound is negative for DVT.  CT scan of the head is negative.  Given the patient's reassuring workup I believe the patient is safe for discharge home.  Will discharge with compression stockings to be worn during the day have the patient follow-up with her doctor.  Patient agreeable to plan of care.  FINAL CLINICAL IMPRESSION(S) / ED DIAGNOSES   Peripheral edema Peripheral neuropathy  Note:  This document was prepared using Dragon voice recognition software and may include unintentional dictation errors.   Minna Antis, MD 07/07/23 [phone]

## 2023-07-07 NOTE — Discharge Instructions (Signed)
As we discussed please wear your compression stockings during the day, you may take these off at night and when you are able to elevate your legs.  Please follow-up with your doctor within the next several days for recheck/reevaluation.  Please follow-up with your orthopedist regarding your right arm discomfort.  Return to the emergency department for any symptom personally concerning to yourself.

## 2023-07-07 NOTE — ED Triage Notes (Signed)
Patient to ED via POV for right hand pain/ numbness. States she also has pain shooting down right leg with swelling to the right ankle. Ongoing x3 weeks.

## 2023-07-07 NOTE — ED Notes (Signed)
Discharge instructions reviewed with patient. Patient questions answered and opportunity for education reviewed. Patient voices understanding of discharge instructions with no further questions. Patient ambulatory with steady gait to lobby.  

## 2023-07-15 ENCOUNTER — Ambulatory Visit: Payer: Medicare Other | Admitting: Psychiatry

## 2023-08-13 ENCOUNTER — Ambulatory Visit (INDEPENDENT_AMBULATORY_CARE_PROVIDER_SITE_OTHER): Payer: Medicare Other | Admitting: Psychiatry

## 2023-08-13 ENCOUNTER — Encounter: Payer: Self-pay | Admitting: Psychiatry

## 2023-08-13 VITALS — BP 144/81 | HR 100 | Ht 63.0 in | Wt 153.0 lb

## 2023-08-13 DIAGNOSIS — F411 Generalized anxiety disorder: Secondary | ICD-10-CM

## 2023-08-13 DIAGNOSIS — F5105 Insomnia due to other mental disorder: Secondary | ICD-10-CM | POA: Diagnosis not present

## 2023-08-13 DIAGNOSIS — F3131 Bipolar disorder, current episode depressed, mild: Secondary | ICD-10-CM

## 2023-08-13 DIAGNOSIS — Z79899 Other long term (current) drug therapy: Secondary | ICD-10-CM | POA: Diagnosis not present

## 2023-08-13 NOTE — Patient Instructions (Addendum)
PLEASE CALL [facility] AT [phone] TO SEE IF YOU CAN BE ESTABLISHED FOR THERAPY.

## 2023-08-13 NOTE — Progress Notes (Unsigned)
BH MD OP Progress Note  08/13/2023 4:03 PM Jennifer [name]  MRN:  [id number]  Chief Complaint:  Chief Complaint  Patient presents with   Follow-up   Depression   Anxiety   Medication Refill   HPI: Jennifer [name] is a 67 year old Caucasian female, lives in [place], married, has a history of bipolar disorder, GAD, insomnia, neuroleptic induced dystonia, chronic pain was evaluated in office today.  Patient's last visit was on 11/13/2022.  Patient today returns reporting she is currently in a lot of pain.  She reports she has stiffness of her joints which kind of flared up recently.  She reports she had to go back to her pain provider and asked for a change of medication.  She reports recently she was started back on hydrocodone-acetaminophen 10-325 mg.  She reports that does not seem to help much either.  She reports the pain does affect her mood and keeps her up at night.  Patient also reports she is currently sad about the fact that her aunt who is an important figure in her life is currently struggling with her health and is in a nursing home.  Patient reports she is currently in [place] and she has no way of getting to her.  Patient reports she has worries daily that her aunt is soon going to die and she will never be able to see her again.  Patient today tearful in session.  Patient currently denies any suicidality, homicidality or perceptual disturbances.  She reports she is taking her Depakote 500 mg some days and 750 mg other days.  She is not compliant with the dosage daily.  She is currently compliant on her other medications like doxepin and Prozac.  Patient also noncompliant with labs ordered in January.  Patient currently does not have a therapist however agrees to establish care with a new therapist.  Patient denies any other concerns today.  Visit Diagnosis:    ICD-10-CM   1. Bipolar 1 disorder, depressed, mild (HCC)  F31.31 Valproic acid level    2. Generalized  anxiety disorder  F41.1     3. Insomnia due to mental condition  F51.05     4. High risk medication use  Z79.899 Valproic acid level      Past Psychiatric History: I have reviewed past psychiatric history from progress note on 02/05/2018.  Past Medical History:  Past Medical History:  Diagnosis Date   Anginal pain (HCC)    Arthritis    Bipolar disorder (HCC)    Blood loss anemia 1985   a.) procedural bleeding during/following cervical Bx --> required PRBC transfusion   CAD (coronary artery disease)    a.) LHC/PCI 10/2005: 2.5 x 28 mm Cypher DES to LAD; b.) LHC/PCI 06/20/2010: 50% pLAD, 90% mLAD (2.5 x 15 mm Xience V DES), 60% D1, 50% pRCA; c.) LHC 08/06/2011: 95% ISR mLAD, 70% D1, 90% mRCA --> refer to CVTS; d.) 3v CABG 08/12/2011; e.) LHC 06/03/2013: 100% ISR mLAD, 40% mRCA, 100% oSVG-RCA - med mgmt   Cervical dysplasia    Chronic pain syndrome    a.) followed by pain management/psychiatry   Chronic, continuous use of opioids    a.) followed by pain management/psychiatry; b.) has naloxone Rx available   COPD (chronic obstructive pulmonary disease) (HCC)    CRD (chronic renal disease)    Depression    Dyspnea on exertion    Enlarged liver    Family history of adverse reaction to anesthesia  a.) delayed emergence in 1st degree relative (father)   Fibromyalgia    Generalized anxiety disorder    GERD (gastroesophageal reflux disease)    Hyperlipidemia    Hypertension    Hypothyroidism    Low back pain    Low sodium levels    Migraines    Neuropathy    Persistent disorder of initiating or maintaining sleep    a.) uses melatonin + carisoprodol   Pneumonia    Primary cancer of skin of ear    Restless leg syndrome    a.) on ropinirole   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   a.) LIMA-LAD, SVG-RCA, SVG-D1   Type 2 diabetes mellitus treated with insulin (HCC)    Von Willebrand disease (HCC)    a.) VWD workup normal in 2018; DOES NOT have primary bleeding disorder; b.) has O positive  blood group (can have mildly low levels of VWF), hence why patient may have been told she has VWD    Past Surgical History:  Procedure Laterality Date   ANTERIOR LAT LUMBAR FUSION N/A 12/10/2016   Procedure: LUMBAR FOUR-FIVE  Anterior lateral lumbar interbody fusion with LUMBAR FOUR-FIVE  Posterolateral fusion/Re-operative laminectomy LUMBAR FIVE-SACRUM ONE Bilateral, Laminectomy at LUMBAR FOUR-FIVE , 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 ENDOSCOPY;  Service: Gastroenterology;  Laterality: N/A;   CORONARY ANGIOPLASTY WITH STENT PLACEMENT Left 10/2005   Procedure: CORONARY ANGIOPLASTY WITH STENT PLACEMENT   CORONARY ANGIOPLASTY WITH STENT PLACEMENT Left 06/20/2010   Procedure: CORONARY ANGIOPLASTY WITH STENT PLACEMENT; Location: [facility]; Surgeon: Julien Nordmann, MD   CORONARY ARTERY BYPASS GRAFT  08/12/2011   Procedure: CORONARY ARTERY BYPASS GRAFT; Location: [place]; Surgeon: Charlett Lango, MD   ESOPHAGOGASTRODUODENOSCOPY (EGD) WITH PROPOFOL N/A 08/13/2018   Procedure: ESOPHAGOGASTRODUODENOSCOPY (EGD) WITH PROPOFOL;  Surgeon: Wyline Mood, MD;  Location[facility] ENDOSCOPY;  Service: Gastroenterology;  Laterality: N/A;   LEFT HEART CATH AND CORONARY ANGIOGRAPHY Left 08/06/2011   Procedure: LEFT HEART CATH AND CORONARY ANGIOGRAPHY; Location: [facility]; Surgeon: Julien Nordmann, MD   LEFT HEART CATH AND CORS/GRAFTS ANGIOGRAPHY Left  06/03/2013   Procedure: LEFT HEART CATH AND CORS/GRAFTS ANGIOGRAPHY; Location: [facility]; Surgeon: Julien Nordmann, MD   LUMBAR FUSION  2007   L1-S1   LUMBAR PERCUTANEOUS PEDICLE SCREW 1 LEVEL N/A 12/10/2016   Procedure: Extension of pedicle screw fixation to LUMBAR FOUR;  Surgeon: Loura Halt Ditty, MD;  Location[facility] OR;  Service: Neurosurgery;  Laterality: N/A;   PLACEMENT OF BREAST IMPLANTS Bilateral 1987   RECTAL EXAM UNDER ANESTHESIA N/A 10/06/2018   Procedure: RECTAL EXAM UNDER ANESTHESIA;  Surgeon: Leafy Ro, MD;  Location: [facility] ORS;  Service: General;  Laterality: N/A;   SHOULDER ARTHROSCOPY WITH SUBACROMIAL DECOMPRESSION, ROTATOR CUFF REPAIR AND BICEP TENDON REPAIR Right 12/19/2022   Procedure: SHOULDER ARTHROSCOPY WITH DEBRIDEMENT, DECOMPRESSION, ROTATOR CUFF REPAIR AND BICEP TENDON REPAIR;  Surgeon: Christena Flake, MD;  Location: [facility] ORS;  Service: Orthopedics;  Laterality: Right;   TOTAL ABDOMINAL  HYSTERECTOMY W/ BILATERAL SALPINGOOPHORECTOMY N/A 1985   TURBINATE REDUCTION  1997    Family Psychiatric History: I have reviewed family psychiatric history from progress note on 02/05/2018.  Family History:  Family History  Problem Relation Age of Onset   Aneurysm Father        abdominal   Diabetes Father    Alcohol abuse Father    Hypertension Father    Aneurysm Brother        abdominal   Alcohol abuse Brother    Hypertension Brother    Colon cancer Brother    Diabetes Mother    Alcohol abuse Mother    Hypertension Other        family hx   Hyperlipidemia Other        family hx   Diabetes Other        family hx    Social History: I have reviewed social history from progress note on 02/05/2018. Social History   Socioeconomic History   Marital status: Married    Spouse name: Film/video editor   Number of children: 3   Years of education: Not on file   Highest education level: Associate degree: occupational, Scientist, product/process development, or vocational program  Occupational History   Not on file   Tobacco Use   Smoking status: Former    Current packs/day: 0.00    Types: Cigarettes    Start date: 06/20/1994    Quit date: 06/20/2010    Years since quitting: 13.1   Smokeless tobacco: Never  Vaping Use   Vaping status: Every Day   Substances: Nicotine, Flavoring  Substance and Sexual Activity   Alcohol use: No    Alcohol/week: 0.0 standard drinks of alcohol   Drug use: No   Sexual activity: Yes  Other Topics Concern   Not on file  Social History Narrative   Not on file   Social Determinants of Health   Financial Resource Strain: Not on file  Food Insecurity: Not on file  Transportation Needs: No Transportation Needs (01/23/2018)   PRAPARE - Administrator, Civil Service (Medical): No    Lack of Transportation (Non-Medical): No  Physical Activity: Inactive (01/23/2018)   Exercise Vital Sign    Days of Exercise per Week: 0 days    Minutes of Exercise per Session: 0 min  Stress: Stress Concern Present (01/23/2018)   [employer] of Occupational Health - Occupational Stress Questionnaire    Feeling of Stress : Rather much  Social Connections: Unknown (01/23/2018)   Social Connection and Isolation Panel [NHANES]    Frequency of Communication with Friends and Family: Not on file    Frequency of Social Gatherings with Friends and Family: Not on file    Attends Religious Services: More than 4 times per year    Active Member of [employer] or Organizations: No    Attends Banker Meetings: Never    Marital Status: Married    Allergies:  Allergies  Allergen Reactions   Aspirin Other (See Comments)    Metabolic Disorder Labs: Lab Results  Component Value Date   HGBA1C 7.2 (H) 01/01/2020   MPG 159.94 01/01/2020   MPG 223.08 12/05/2017   Lab Results  Component Value Date   PROLACTIN 6.1 12/05/2017   Lab Results  Component Value Date   CHOL 139 07/26/2022   TRIG 237 (H) 07/26/2022   HDL 38 (L) 07/26/2022   CHOLHDL 3.7 07/26/2022   VLDL 47  (H) 07/26/2022   LDLCALC 54 07/26/2022  LDLCALC UNABLE TO CALCULATE IF TRIGLYCERIDE OVER 400 mg/dL 16/07/9603   Lab Results  Component Value Date   TSH 2.600 08/03/2018   TSH 86.000 (H) 12/05/2017    Therapeutic Level Labs: No results found for: "LITHIUM" Lab Results  Component Value Date   VALPROATE 25 (L) 11/13/2022   No results found for: "CBMZ"  Current Medications: Current Outpatient Medications  Medication Sig Dispense Refill   acetaminophen (TYLENOL) 500 MG tablet Take 1,000 mg by mouth every 6 (six) hours as needed.     albuterol-ipratropium (COMBIVENT) 18-103 MCG/ACT inhaler Inhale 2 puffs into the lungs daily as needed for wheezing or shortness of breath.      amLODipine (NORVASC) 10 MG tablet Take 1 tablet (10 mg total) by mouth daily. 90 tablet 3   atorvastatin (LIPITOR) 80 MG tablet Take 1 tablet (80 mg total) by mouth daily. 90 tablet 3   carisoprodol (SOMA) 350 MG tablet Take 1 tablet (350 mg total) by mouth 4 (four) times daily.     carvedilol (COREG) 12.5 MG tablet Take 1 tablet (12.5 mg total) by mouth 2 (two) times daily with a meal. 180 tablet 3   Continuous Blood Gluc Receiver (FREESTYLE LIBRE 2 READER) DEVI Use 1 Device as directed     DETROL LA 4 MG 24 hr capsule Take 4 mg by mouth daily.     diclofenac Sodium (VOLTAREN) 1 % GEL as needed.     divalproex (DEPAKOTE ER) 250 MG 24 hr tablet Take 1 tablet (250 mg total) by mouth daily with supper. Take along with 500 mg , total of 750 mg daily 90 tablet 0   divalproex (DEPAKOTE ER) 500 MG 24 hr tablet Take 1 tablet (500 mg total) by mouth daily with supper. Take along with 250 mg daily 90 tablet 0   doxepin (SINEQUAN) 10 MG capsule TAKE 1 TO 2 CAPSULES AT BEDTIME AS NEEDED (DOSE REDUCTION) 180 capsule 0   Elastic Bandages & Supports (MEDICAL COMPRESSION STOCKINGS) MISC Please provide compression stockings 1 each 0   ezetimibe (ZETIA) 10 MG tablet Take 10 mg by mouth daily.     FLUoxetine (PROZAC) 20 MG  capsule Take 1 capsule (20 mg total) by mouth daily. 90 capsule 0   fluticasone (FLONASE) 50 MCG/ACT nasal spray Place 1 spray into both nostrils daily as needed.      glucose blood (FREESTYLE LITE) test strip 3 (three) times daily     HUMULIN 70/30 KWIKPEN (70-30) 100 UNIT/ML PEN Inject 10-15 Units into the skin See admin instructions. Inject 22 units SQ in the morning and inject 22 units SQ at bedtime per sliding scale     ibuprofen (ADVIL) 200 MG tablet Take 200 mg by mouth every 6 (six) hours as needed. 400 mg     levothyroxine (SYNTHROID) 25 MCG tablet TAKE 1 TABLET ONCE DAILY TAKE ON AN EMPTY STOMACH WITH A GLASS OF WATER AT LEAST 30 TO 60 MINUTES BEFORE BREAKFAST     lidocaine (LIDODERM) 5 % as needed.     Melatonin 10 MG TABS Take 1 tablet by mouth.     metFORMIN (GLUCOPHAGE-XR) 750 MG 24 hr tablet Take 750 mg by mouth in the morning and at bedtime.     naloxone (NARCAN) nasal spray 4 mg/0.1 mL SMARTSIG:Both Nares     nitroGLYCERIN (NITROSTAT) 0.4 MG SL tablet Place 1 tablet (0.4 mg total) under the tongue every 5 (five) minutes as needed for chest pain. 25 tablet 6   omeprazole (  PRILOSEC) 20 MG capsule Take 20 mg by mouth daily.     Oxycodone HCl 10 MG TABS Take 1 tablet (10 mg total) by mouth every 4 (four) hours as needed. 40 tablet 0   RESTASIS 0.05 % ophthalmic emulsion Place 1 drop into both eyes daily as needed (for dryness).     rOPINIRole (REQUIP) 0.5 MG tablet Take 1 mg by mouth at bedtime.      SURE COMFORT PEN NEEDLES 31G X 8 MM MISC      telmisartan (MICARDIS) 80 MG tablet Take 1 tablet (80 mg total) by mouth daily. 90 tablet 3   sitaGLIPtin (JANUVIA) 100 MG tablet Take 100 mg by mouth daily.     No current facility-administered medications for this visit.     Musculoskeletal: Strength & Muscle Tone: within normal limits Gait & Station: normal Patient leans: N/A  Psychiatric Specialty Exam: Review of Systems  Psychiatric/Behavioral:  Positive for decreased  concentration, dysphoric mood and sleep disturbance. The patient is nervous/anxious.     Blood pressure (!) 144/81, pulse 100, height 5\' 3"  (1.6 m), weight 153 lb (69.4 kg).Body mass index is 27.1 kg/m.  General Appearance: Fairly Groomed  Eye Contact:  Fair  Speech:  Clear and Coherent  Volume:  Normal  Mood:  Anxious and Depressed  Affect:  Tearful  Thought Process:  Goal Directed and Descriptions of Associations: Intact  Orientation:  Full (Time, Place, and Person)  Thought Content: Logical   Suicidal Thoughts:  No  Homicidal Thoughts:  No  Memory:  Immediate;   Fair Recent;   Fair Remote;   Fair  Judgement:  Fair  Insight:  Fair  Psychomotor Activity:  Normal  Concentration:  Concentration: Fair and Attention Span: Fair  Recall:  [employer] of Knowledge: Fair  Language: Fair  Akathisia:  No  Handed:  Right  AIMS (if indicated): not done  Assets:  Communication Skills Desire for Improvement Housing Social Support Transportation  ADL's:  Intact  Cognition: WNL  Sleep:  Poor due to pain   Screenings: AIMS    Flowsheet Row Office Visit from 04/17/2022 in [facility] Office Visit from 12/20/2021 in [facility] Office Visit from 12/23/2018 in [facility] Office Visit from 08/18/2018 in [facility]  AIMS Total Score 0 3 5 6       GAD-7    Flowsheet Row Office Visit from 11/13/2022 [facility] Office Visit from 09/11/2022 in [facility] Office Visit from 07/17/2022 in [facility] Office Visit from 11/15/2021 in [facility] Video Visit from 04/10/2021 in [facility]  Total GAD-7 Score 5 14 10 4 16       PHQ2-9     Flowsheet Row Office Visit from 11/13/2022 in [facility] Office Visit from 09/11/2022 in [facility] Office Visit from 07/17/2022 in [facility] Office Visit from 04/17/2022 in [facility] Office Visit from 12/20/2021 in [facility]  PHQ-2 Total Score 3 3 3 2 1   PHQ-9 Total Score 10 13 11 10 8       Flowsheet Row ED from 07/07/2023 in [facility] Department at [facility] Admission (Discharged) from 12/19/2022 in [facility] Office Visit from 11/13/2022 in [facility]  C-SSRS RISK CATEGORY No Risk No Risk No Risk        Assessment and Plan: Jennifer [name] is a 67 year old Caucasian female who has a history of bipolar disorder, anxiety disorder, was evaluated in office today.  Patient currently with multiple psychosocial stressors including her chronic pain, her aunt who is currently struggling with terminal illness, will benefit from medication management, psychotherapy session however patient has been noncompliant with labs, plan as noted below.  Plan Bipolar disorder depressed moderate-unstable Continue Depakote ER 750 mg p.o. daily. Patient will benefit from Depakote level-patient agrees to go to [facility] lab.  Once labs reviewed will consider increasing the dosage further to address current mood symptoms.   GAD-unstable Prozac 20 mg p.o. daily Patient will benefit from psychotherapy-provided information for Ms. Phyllis Ginger at [phone].  Insomnia-unstable due to pain Doxepin 10 to 20 mg at bedtime as needed Patient will benefit from pain management.  High risk medication use-will order Depakote level-patient to go to [facility] lab. I have reviewed platelet count-07/21/2023-within normal limits,  sodium level-138-dated 07/07/2023, 11 manage-within normal limits.  Follow-up in clinic in 4 weeks or sooner if needed.  Collaboration of Care: Collaboration of Care: Referral or follow-up with counselor/therapist AEB patient encouraged to establish care with a therapist.  Patient/Guardian was advised Release of Information must be obtained prior to any record release in order to collaborate their care with an outside provider. Patient/Guardian was advised if they have not already done so to contact the registration department to sign all necessary forms in order for [place] to release information regarding their care.   Consent: Patient/Guardian gives verbal consent for treatment and assignment of benefits for services provided during this visit. Patient/Guardian expressed understanding and agreed to proceed.   This note was generated in part or whole with voice recognition software. Voice recognition is usually quite accurate but there are transcription errors that can and very often do occur. I apologize for any typographical errors that were not detected and corrected.    Jomarie Longs, MD 08/13/2023, 4:03 PM

## 2023-08-22 ENCOUNTER — Other Ambulatory Visit
Admission: RE | Admit: 2023-08-22 | Discharge: 2023-08-22 | Disposition: A | Discharge status: Home / Self Care | Payer: Medicare Other | Attending: Psychiatry | Admitting: Psychiatry

## 2023-08-22 ENCOUNTER — Telehealth: Payer: Self-pay | Admitting: Psychiatry

## 2023-08-22 DIAGNOSIS — F3131 Bipolar disorder, current episode depressed, mild: Secondary | ICD-10-CM | POA: Insufficient documentation

## 2023-08-22 DIAGNOSIS — Z79899 Other long term (current) drug therapy: Secondary | ICD-10-CM | POA: Diagnosis present

## 2023-08-22 LAB — HEPATIC FUNCTION PANEL
ALT: 15 U/L (ref 0–44)
AST: 21 U/L (ref 15–41)
Albumin: 4.1 g/dL (ref 3.5–5.0)
Alkaline Phosphatase: 68 U/L (ref 38–126)
Bilirubin, Direct: <0.1 mg/dL (ref 0.0–0.2)
Total Bilirubin: 0.6 mg/dL (ref 0.3–1.2)
Total Protein: 7.1 g/dL (ref 6.5–8.1)

## 2023-08-22 LAB — PLATELET COUNT: Platelets: 266 10*3/uL (ref 150–400)

## 2023-08-22 LAB — VALPROIC ACID LEVEL: Valproic Acid Lvl: 46 ug/mL — ABNORMAL LOW (ref 50.0–100.0)

## 2023-08-22 LAB — SODIUM: Sodium: 134 mmol/L — ABNORMAL LOW (ref 135–145)

## 2023-08-22 NOTE — Telephone Encounter (Signed)
Attempted to contact patient to discuss labs had to leave a voicemail.

## 2023-08-26 ENCOUNTER — Telehealth: Payer: Self-pay | Admitting: Psychiatry

## 2023-08-26 NOTE — Telephone Encounter (Signed)
Reviewed labs-dated 08/22/2023-sodium borderline low at 134 Hepatic function panel-within normal limits, platelet count-within normal limits Depakote level-subtherapeutic at 46.  Attempted to contact patient couple of days ago however had to leave a voicemail.  Will have staff try to contact this patient again to discuss.

## 2023-08-26 NOTE — Telephone Encounter (Signed)
left message asking patient to call office back. also left message with instruction per dr. Elna Breslow order. I will call again tomorrow to see if i can speak with patient

## 2023-08-26 NOTE — Telephone Encounter (Signed)
Noted  

## 2023-08-27 NOTE — Telephone Encounter (Signed)
tried to call patient again today. I left another message with dr. Elna Breslow direction,.

## 2023-09-10 ENCOUNTER — Ambulatory Visit (INDEPENDENT_AMBULATORY_CARE_PROVIDER_SITE_OTHER): Payer: Medicare Other | Admitting: Psychiatry

## 2023-09-10 ENCOUNTER — Encounter: Payer: Self-pay | Admitting: Psychiatry

## 2023-09-10 VITALS — BP 129/73 | HR 84 | Temp 97.8°F | Ht 63.0 in | Wt 157.8 lb

## 2023-09-10 DIAGNOSIS — Z79899 Other long term (current) drug therapy: Secondary | ICD-10-CM | POA: Diagnosis not present

## 2023-09-10 DIAGNOSIS — F3131 Bipolar disorder, current episode depressed, mild: Secondary | ICD-10-CM

## 2023-09-10 DIAGNOSIS — F5105 Insomnia due to other mental disorder: Secondary | ICD-10-CM | POA: Diagnosis not present

## 2023-09-10 DIAGNOSIS — F411 Generalized anxiety disorder: Secondary | ICD-10-CM

## 2023-09-10 MED ORDER — DIVALPROEX SODIUM ER 500 MG PO TB24: 1000.0000 mg | ORAL_TABLET | Freq: Every day | ORAL | 0 refills | Status: AC

## 2023-09-10 MED ORDER — FLUOXETINE HCL 20 MG PO CAPS: 20.0000 mg | ORAL_CAPSULE | Freq: Every day | ORAL | 3 refills | Status: DC

## 2023-09-10 NOTE — Progress Notes (Unsigned)
[facility] MD OP Progress Note  09/10/2023 3:56 PM Jennifer [name]  MRN:  [id number]  Chief Complaint:  Chief Complaint  Patient presents with   Follow-up   Depression   Anxiety   Medication Refill   HPI: Jennifer [name] is a 67 year old Caucasian female, lives in [place], married, has a history of bipolar disorder, GAD, insomnia, neuroleptic induced dystonia, chronic pain was evaluated in office today.  Patient today reports her aunt passed away recently.  She was able to attend the primordial service.  She is coping with her grief okay.  She is relieved that her aunt is not suffering anymore.  Patient reports her mood symptoms as improving.  She feels better with regards to her depression symptoms.  Patient reports anxiety is more manageable.  She however is interested in increasing the dosage of Depakote further.  Patient's Depakote level which was reviewed with her on 08/22/2023 was subtherapeutic at 46.  Patient reports sleep is restless mostly due to pain.  She does have doxepin available as needed.  Patient denies any suicidality, homicidality or perceptual disturbances.  Patient denies any other concerns today.  Visit Diagnosis:    ICD-10-CM   1. Bipolar 1 disorder, depressed, mild (HCC)  F31.31 divalproex (DEPAKOTE ER) 500 MG 24 hr tablet    2. Generalized anxiety disorder  F41.1 divalproex (DEPAKOTE ER) 500 MG 24 hr tablet    FLUoxetine (PROZAC) 20 MG capsule    3. Insomnia due to mental condition  F51.05 divalproex (DEPAKOTE ER) 500 MG 24 hr tablet   mood, pain    4. High risk medication use  Z79.899 Valproic acid level    Sodium      Past Psychiatric History: I have reviewed past psychiatric history from progress note on 02/05/2018.  Past Medical History:  Past Medical History:  Diagnosis Date   Anginal pain (HCC)    Arthritis    Bipolar disorder (HCC)    Blood loss anemia 1985   a.) procedural bleeding during/following cervical Bx --> required PRBC transfusion    CAD (coronary artery disease)    a.) LHC/PCI 10/2005: 2.5 x 28 mm Cypher DES to LAD; b.) LHC/PCI 06/20/2010: 50% pLAD, 90% mLAD (2.5 x 15 mm Xience V DES), 60% D1, 50% pRCA; c.) LHC 08/06/2011: 95% ISR mLAD, 70% D1, 90% mRCA --> refer to CVTS; d.) 3v CABG 08/12/2011; e.) LHC 06/03/2013: 100% ISR mLAD, 40% mRCA, 100% oSVG-RCA - med mgmt   Cervical dysplasia    Chronic pain syndrome    a.) followed by pain management/psychiatry   Chronic, continuous use of opioids    a.) followed by pain management/psychiatry; b.) has naloxone Rx available   COPD (chronic obstructive pulmonary disease) (HCC)    CRD (chronic renal disease)    Depression    Dyspnea on exertion    Enlarged liver    Family history of adverse reaction to anesthesia    a.) delayed emergence in 1st degree relative (father)   Fibromyalgia    Generalized anxiety disorder    GERD (gastroesophageal reflux disease)    Hyperlipidemia    Hypertension    Hypothyroidism    Low back pain    Low sodium levels    Migraines    Neuropathy    Persistent disorder of initiating or maintaining sleep    a.) uses melatonin + carisoprodol   Pneumonia    Primary cancer of skin of ear    Restless leg syndrome    a.) on ropinirole  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   a.) LIMA-LAD, SVG-RCA, SVG-D1   Type 2 diabetes mellitus treated with insulin (HCC)    Von Willebrand disease (HCC)    a.) VWD workup normal in 2018; DOES NOT have primary bleeding disorder; b.) has O positive blood group (can have mildly low levels of VWF), hence why patient may have been told she has VWD    Past Surgical History:  Procedure Laterality Date   ANTERIOR LAT LUMBAR FUSION N/A 12/10/2016   Procedure: LUMBAR FOUR-FIVE  Anterior lateral lumbar interbody fusion with LUMBAR FOUR-FIVE  Posterolateral fusion/Re-operative laminectomy LUMBAR FIVE-SACRUM ONE Bilateral, Laminectomy at LUMBAR FOUR-FIVE , 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  and Left LUMBAR  FOUR-FIVE  with Mazor;  Surgeon: Loura Halt Ditty, MD;  Location: [facility] OR;  Service: Neurosurgery;  Laterality: N/A;   APPLICATION OF ROBOTIC ASSISTANCE FOR SPINAL PROCEDURE N/A 12/10/2016   Procedure: APPLICATION OF ROBOTIC ASSISTANCE FOR SPINAL PROCEDURE;  Surgeon: Loura Halt Ditty, MD;  Location[facility];  Service: Neurosurgery;  Laterality: N/A;   BREAST IMPLANT REMOVAL Bilateral 1993   COLONOSCOPY     COLONOSCOPY WITH PROPOFOL N/A 08/13/2018   Procedure: COLONOSCOPY WITH PROPOFOL;  Surgeon: Wyline Mood, MD;  Location: [facility] ENDOSCOPY;  Service: Gastroenterology;  Laterality: N/A;   CORONARY ANGIOPLASTY WITH STENT PLACEMENT Left 10/2005   Procedure: CORONARY ANGIOPLASTY WITH STENT PLACEMENT   CORONARY ANGIOPLASTY WITH STENT PLACEMENT Left 06/20/2010   Procedure: CORONARY ANGIOPLASTY WITH STENT PLACEMENT; Location: [facility]; Surgeon: Julien Nordmann, MD   CORONARY ARTERY BYPASS GRAFT  08/12/2011   Procedure: CORONARY ARTERY BYPASS GRAFT; Location: [place]; Surgeon: Charlett Lango, MD   ESOPHAGOGASTRODUODENOSCOPY (EGD) WITH PROPOFOL N/A 08/13/2018   Procedure: ESOPHAGOGASTRODUODENOSCOPY (EGD) WITH PROPOFOL;  Surgeon: Wyline Mood, MD;  Location: [facility] ENDOSCOPY;  Service: Gastroenterology;  Laterality: N/A;   LEFT HEART CATH AND CORONARY ANGIOGRAPHY Left 08/06/2011   Procedure: LEFT HEART CATH AND CORONARY ANGIOGRAPHY; Location: [facility]; Surgeon: Julien Nordmann, MD   LEFT HEART CATH AND CORS/GRAFTS ANGIOGRAPHY Left 06/03/2013   Procedure: LEFT HEART CATH AND CORS/GRAFTS ANGIOGRAPHY; Location: [facility]; Surgeon: Julien Nordmann, MD   LUMBAR FUSION  2007   L1-S1   LUMBAR PERCUTANEOUS PEDICLE SCREW 1 LEVEL N/A 12/10/2016   Procedure: Extension of pedicle screw fixation to LUMBAR FOUR;  Surgeon: Loura Halt Ditty, MD;  Location: [facility] OR;  Service: Neurosurgery;  Laterality: N/A;   PLACEMENT OF BREAST IMPLANTS Bilateral 1987   RECTAL EXAM UNDER ANESTHESIA N/A 10/06/2018   Procedure: RECTAL EXAM  UNDER ANESTHESIA;  Surgeon: Leafy Ro, MD;  Location: [facility] ORS;  Service: General;  Laterality: N/A;   SHOULDER ARTHROSCOPY WITH SUBACROMIAL DECOMPRESSION, ROTATOR CUFF REPAIR AND BICEP TENDON REPAIR Right 12/19/2022   Procedure: SHOULDER ARTHROSCOPY WITH DEBRIDEMENT, DECOMPRESSION, ROTATOR CUFF REPAIR AND BICEP TENDON REPAIR;  Surgeon: Christena Flake, MD;  Location: [facility] ORS;  Service: Orthopedics;  Laterality: Right;   TOTAL ABDOMINAL HYSTERECTOMY W/ BILATERAL SALPINGOOPHORECTOMY N/A 1985   TURBINATE REDUCTION  1997    Family Psychiatric History: I have reviewed family psychiatric history from progress note on 02/05/2018.  Family History:  Family History  Problem Relation Age of Onset   Aneurysm Father        abdominal   Diabetes Father    Alcohol abuse Father    Hypertension Father    Aneurysm Brother        abdominal   Alcohol abuse Brother    Hypertension Brother    Colon cancer Brother  Diabetes Mother    Alcohol abuse Mother    Hypertension Other        family hx   Hyperlipidemia Other        family hx   Diabetes Other        family hx    Social History: I have reviewed social history from progress note on 02/05/2018. Social History   Socioeconomic History   Marital status: Married    Spouse name: Film/video editor   Number of children: 3   Years of education: Not on file   Highest education level: Associate degree: occupational, Scientist, product/process development, or vocational program  Occupational History   Not on file  Tobacco Use   Smoking status: Former    Current packs/day: 0.00    Types: Cigarettes    Start date: 06/20/1994    Quit date: 06/20/2010    Years since quitting: 13.2   Smokeless tobacco: Never  Vaping Use   Vaping status: Every Day   Substances: Nicotine, Flavoring  Substance and Sexual Activity   Alcohol use: No    Alcohol/week: 0.0 standard drinks of alcohol   Drug use: No   Sexual activity: Yes  Other Topics Concern   Not on file  Social History Narrative   Not  on file   Social Determinants of Health   Financial Resource Strain: Not on file  Food Insecurity: Not on file  Transportation Needs: No Transportation Needs (01/23/2018)   PRAPARE - Administrator, Civil Service (Medical): No    Lack of Transportation (Non-Medical): No  Physical Activity: Inactive (01/23/2018)   Exercise Vital Sign    Days of Exercise per Week: 0 days    Minutes of Exercise per Session: 0 min  Stress: Stress Concern Present (01/23/2018)   [employer] of Occupational Health - Occupational Stress Questionnaire    Feeling of Stress : Rather much  Social Connections: Unknown (01/23/2018)   Social Connection and Isolation Panel [NHANES]    Frequency of Communication with Friends and Family: Not on file    Frequency of Social Gatherings with Friends and Family: Not on file    Attends Religious Services: More than 4 times per year    Active Member of [employer] or Organizations: No    Attends Banker Meetings: Never    Marital Status: Married    Allergies:  Allergies  Allergen Reactions   Aspirin Other (See Comments)    Metabolic Disorder Labs: Lab Results  Component Value Date   HGBA1C 7.2 (H) 01/01/2020   MPG 159.94 01/01/2020   MPG 223.08 12/05/2017   Lab Results  Component Value Date   PROLACTIN 6.1 12/05/2017   Lab Results  Component Value Date   CHOL 139 07/26/2022   TRIG 237 (H) 07/26/2022   HDL 38 (L) 07/26/2022   CHOLHDL 3.7 07/26/2022   VLDL 47 (H) 07/26/2022   LDLCALC 54 07/26/2022   LDLCALC UNABLE TO CALCULATE IF TRIGLYCERIDE OVER 400 mg/dL 66/44/0347   Lab Results  Component Value Date   TSH 2.600 08/03/2018   TSH 86.000 (H) 12/05/2017    Therapeutic Level Labs: No results found for: "LITHIUM" Lab Results  Component Value Date   VALPROATE 46 (L) 08/22/2023   VALPROATE 25 (L) 11/13/2022   No results found for: "CBMZ"  Current Medications: Current Outpatient Medications  Medication Sig Dispense Refill    acetaminophen (TYLENOL) 500 MG tablet Take 1,000 mg by mouth every 6 (six) hours as needed.     albuterol-ipratropium (COMBIVENT)  18-103 MCG/ACT inhaler Inhale 2 puffs into the lungs daily as needed for wheezing or shortness of breath.      amLODipine (NORVASC) 10 MG tablet Take 1 tablet (10 mg total) by mouth daily. 90 tablet 3   atorvastatin (LIPITOR) 80 MG tablet Take 1 tablet (80 mg total) by mouth daily. 90 tablet 3   carisoprodol (SOMA) 350 MG tablet Take 1 tablet (350 mg total) by mouth 4 (four) times daily.     carvedilol (COREG) 12.5 MG tablet Take 1 tablet (12.5 mg total) by mouth 2 (two) times daily with a meal. 180 tablet 3   Continuous Blood Gluc Receiver (FREESTYLE LIBRE 2 READER) DEVI Use 1 Device as directed     DETROL LA 4 MG 24 hr capsule Take 4 mg by mouth daily.     diclofenac Sodium (VOLTAREN) 1 % GEL as needed.     doxepin (SINEQUAN) 10 MG capsule TAKE 1 TO 2 CAPSULES AT BEDTIME AS NEEDED (DOSE REDUCTION) 180 capsule 0   Elastic Bandages & Supports (MEDICAL COMPRESSION STOCKINGS) MISC Please provide compression stockings 1 each 0   ezetimibe (ZETIA) 10 MG tablet Take 10 mg by mouth daily.     fluticasone (FLONASE) 50 MCG/ACT nasal spray Place 1 spray into both nostrils daily as needed.      glucose blood (FREESTYLE LITE) test strip 3 (three) times daily     HUMULIN 70/30 KWIKPEN (70-30) 100 UNIT/ML PEN Inject 10-15 Units into the skin See admin instructions. Inject 22 units SQ in the morning and inject 22 units SQ at bedtime per sliding scale     ibuprofen (ADVIL) 200 MG tablet Take 200 mg by mouth every 6 (six) hours as needed. 400 mg     levothyroxine (SYNTHROID) 25 MCG tablet TAKE 1 TABLET ONCE DAILY TAKE ON AN EMPTY STOMACH WITH A GLASS OF WATER AT LEAST 30 TO 60 MINUTES BEFORE BREAKFAST     lidocaine (LIDODERM) 5 % as needed.     Melatonin 10 MG TABS Take 1 tablet by mouth.     metFORMIN (GLUCOPHAGE-XR) 750 MG 24 hr tablet Take 750 mg by mouth in the morning and  at bedtime.     naloxone (NARCAN) nasal spray 4 mg/0.1 mL SMARTSIG:Both Nares     nitroGLYCERIN (NITROSTAT) 0.4 MG SL tablet Place 1 tablet (0.4 mg total) under the tongue every 5 (five) minutes as needed for chest pain. 25 tablet 6   omeprazole (PRILOSEC) 20 MG capsule Take 20 mg by mouth daily.     Oxycodone HCl 10 MG TABS Take 1 tablet (10 mg total) by mouth every 4 (four) hours as needed. 40 tablet 0   RESTASIS 0.05 % ophthalmic emulsion Place 1 drop into both eyes daily as needed (for dryness).     rOPINIRole (REQUIP) 0.5 MG tablet Take 1 mg by mouth at bedtime.      SURE COMFORT PEN NEEDLES 31G X 8 MM MISC      telmisartan (MICARDIS) 80 MG tablet Take 1 tablet (80 mg total) by mouth daily. 90 tablet 3   divalproex (DEPAKOTE ER) 500 MG 24 hr tablet Take 2 tablets (1,000 mg total) by mouth daily with supper. Dose change 180 tablet 0   FLUoxetine (PROZAC) 20 MG capsule Take 1 capsule (20 mg total) by mouth daily. 90 capsule 3   sitaGLIPtin (JANUVIA) 100 MG tablet Take 100 mg by mouth daily.     No current facility-administered medications for this visit.  Musculoskeletal: Strength & Muscle Tone: within normal limits Gait & Station: normal Patient leans: N/A  Psychiatric Specialty Exam: Review of Systems  Psychiatric/Behavioral:  Positive for dysphoric mood and sleep disturbance. The patient is nervous/anxious.     Blood pressure 129/73, pulse 84, temperature 97.8 F (36.6 C), temperature source Skin, height 5\' 3"  (1.6 m), weight 157 lb 12.8 oz (71.6 kg).Body mass index is 27.95 kg/m.  General Appearance: Fairly Groomed  Eye Contact:  Fair  Speech:  Clear and Coherent  Volume:  Normal  Mood:  Anxious and Depressed  Affect:  Appropriate  Thought Process:  Goal Directed and Descriptions of Associations: Intact  Orientation:  Full (Time, Place, and Person)  Thought Content: Logical   Suicidal Thoughts:  No  Homicidal Thoughts:  No  Memory:  Immediate;   Fair Recent;    Fair Remote;   Fair  Judgement:  Fair  Insight:  Fair  Psychomotor Activity:  Normal  Concentration:  Concentration: Fair and Attention Span: Fair  Recall:  [employer] of Knowledge: Fair  Language: Fair  Akathisia:  No  Handed:  Right  AIMS (if indicated): not done  Assets:  Communication Skills Desire for Improvement Housing Social Support  ADL's:  Intact  Cognition: WNL  Sleep:   Restless due to pain   Screenings: AIMS    Flowsheet Row Office Visit from 04/17/2022 in [facility] Office Visit from 12/20/2021 in [facility] Office Visit from 12/23/2018 in [facility] Office Visit from 08/18/2018 in [facility]  AIMS Total Score 0 3 5 6       GAD-7    Flowsheet Row Office Visit from 09/10/2023 in [facility] Office Visit from 08/13/2023 in [facility] Office Visit from 11/13/2022 in [facility] Office Visit from 09/11/2022 in [facility] Office Visit from 07/17/2022 in [facility]  Total GAD-7 Score 6 15 5 14 10       PHQ2-9    Flowsheet Row Office Visit from 09/10/2023 in [facility] Office Visit from 08/13/2023 in [facility] Office Visit from 11/13/2022 in [facility] Office Visit from 09/11/2022 in [facility] Office Visit from 07/17/2022 in [facility]  PHQ-2 Total Score 1 4 3 3 3   PHQ-9 Total Score 8 14 10 13 11       Flowsheet Row Office Visit from 09/10/2023 in [facility]  Office Visit from 08/13/2023 in [facility] ED from 07/07/2023 in [facility] Emergency Department at [facility]  C-SSRS RISK CATEGORY Moderate Risk No Risk No Risk        Assessment and Plan: KAYLIEE SKURKA is a 67 year old Caucasian female who has a history of bipolar disorder, anxiety disorder was evaluated in office today.  Patient with improvement of her mood symptoms however will benefit from medication readjustment, Depakote level recently subtherapeutic.  Plan as noted below.  Plan  Bipolar disorder depressed moderate-improving Increase Depakote ER 1000 mg p.o. daily   GAD-improving Continue fluoxetine 20 mg p.o. daily Patient was provided information for therapist-Ms. Phyllis Ginger last visit-pending  Insomnia-unstable due to pain She will need sufficient pain management Doxepin 10 to 20 mg p.o.  nightly as needed  High risk medication use-reviewed and discussed Depakote level-subtherapeutic at 46, sodium low at 134, liver function and platelet count-within normal limits-dated 08/22/2023. We will repeat sodium level and Depakote level in a week after increasing the dosage as noted above.  Patient advised to go to [facility] lab.   Follow-up in clinic in 4 weeks or sooner if needed. Consent: Patient/Guardian gives verbal consent for treatment and assignment of benefits for services provided during this visit. Patient/Guardian expressed understanding and agreed to proceed.   This note was generated in part or whole with voice recognition software. Voice recognition is usually quite accurate but there are transcription errors that can and very often do occur. I apologize for any typographical errors that were not detected and corrected.    Jomarie Longs, MD 09/10/2023, 3:56 PM

## 2023-10-09 ENCOUNTER — Other Ambulatory Visit
Admission: RE | Admit: 2023-10-09 | Discharge: 2023-10-09 | Disposition: A | Discharge status: Home / Self Care | Payer: Medicare Other | Source: Ambulatory Visit | Attending: Psychiatry | Admitting: Psychiatry

## 2023-10-09 ENCOUNTER — Ambulatory Visit: Payer: Medicare Other | Admitting: Psychiatry

## 2023-10-09 DIAGNOSIS — Z79899 Other long term (current) drug therapy: Secondary | ICD-10-CM | POA: Diagnosis present

## 2023-10-09 LAB — VALPROIC ACID LEVEL: Valproic Acid Lvl: 53 ug/mL (ref 50.0–100.0)

## 2023-10-09 LAB — SODIUM: Sodium: 134 mmol/L — ABNORMAL LOW (ref 135–145)

## 2024-01-06 ENCOUNTER — Encounter: Payer: Self-pay | Admitting: *Deleted

## 2024-01-13 ENCOUNTER — Ambulatory Visit: Admitting: Anesthesiology

## 2024-01-13 ENCOUNTER — Encounter: Payer: Self-pay | Admitting: *Deleted

## 2024-01-13 ENCOUNTER — Ambulatory Visit
Admission: RE | Admit: 2024-01-13 | Discharge: 2024-01-13 | Disposition: A | Discharge status: Home / Self Care | Payer: Medicare Other | Attending: Gastroenterology | Admitting: Gastroenterology

## 2024-01-13 ENCOUNTER — Encounter
Admission: RE | Disposition: A | Discharge status: Home / Self Care | Payer: Self-pay | Source: Home / Self Care | Attending: Gastroenterology

## 2024-01-13 DIAGNOSIS — I129 Hypertensive chronic kidney disease with stage 1 through stage 4 chronic kidney disease, or unspecified chronic kidney disease: Secondary | ICD-10-CM | POA: Insufficient documentation

## 2024-01-13 DIAGNOSIS — N189 Chronic kidney disease, unspecified: Secondary | ICD-10-CM | POA: Insufficient documentation

## 2024-01-13 DIAGNOSIS — E1151 Type 2 diabetes mellitus with diabetic peripheral angiopathy without gangrene: Secondary | ICD-10-CM | POA: Insufficient documentation

## 2024-01-13 DIAGNOSIS — I2510 Atherosclerotic heart disease of native coronary artery without angina pectoris: Secondary | ICD-10-CM | POA: Insufficient documentation

## 2024-01-13 DIAGNOSIS — K219 Gastro-esophageal reflux disease without esophagitis: Secondary | ICD-10-CM | POA: Insufficient documentation

## 2024-01-13 DIAGNOSIS — Z7984 Long term (current) use of oral hypoglycemic drugs: Secondary | ICD-10-CM | POA: Diagnosis not present

## 2024-01-13 DIAGNOSIS — E1122 Type 2 diabetes mellitus with diabetic chronic kidney disease: Secondary | ICD-10-CM | POA: Diagnosis not present

## 2024-01-13 DIAGNOSIS — K2289 Other specified disease of esophagus: Secondary | ICD-10-CM | POA: Insufficient documentation

## 2024-01-13 DIAGNOSIS — J449 Chronic obstructive pulmonary disease, unspecified: Secondary | ICD-10-CM | POA: Diagnosis not present

## 2024-01-13 DIAGNOSIS — D123 Benign neoplasm of transverse colon: Secondary | ICD-10-CM | POA: Diagnosis not present

## 2024-01-13 DIAGNOSIS — K5730 Diverticulosis of large intestine without perforation or abscess without bleeding: Secondary | ICD-10-CM | POA: Diagnosis not present

## 2024-01-13 DIAGNOSIS — R1310 Dysphagia, unspecified: Secondary | ICD-10-CM | POA: Insufficient documentation

## 2024-01-13 DIAGNOSIS — Z1211 Encounter for screening for malignant neoplasm of colon: Secondary | ICD-10-CM | POA: Diagnosis present

## 2024-01-13 DIAGNOSIS — K640 First degree hemorrhoids: Secondary | ICD-10-CM | POA: Diagnosis not present

## 2024-01-13 DIAGNOSIS — D1779 Benign lipomatous neoplasm of other sites: Secondary | ICD-10-CM | POA: Diagnosis not present

## 2024-01-13 DIAGNOSIS — Z800 Family history of malignant neoplasm of digestive organs: Secondary | ICD-10-CM | POA: Diagnosis not present

## 2024-01-13 HISTORY — PX: POLYPECTOMY: SHX5525

## 2024-01-13 HISTORY — PX: ESOPHAGOGASTRODUODENOSCOPY (EGD) WITH PROPOFOL: SHX5813

## 2024-01-13 HISTORY — PX: COLONOSCOPY WITH PROPOFOL: SHX5780

## 2024-01-13 LAB — GLUCOSE, CAPILLARY: Glucose-Capillary: 161 mg/dL — ABNORMAL HIGH (ref 70–99)

## 2024-01-13 SURGERY — COLONOSCOPY WITH PROPOFOL
Anesthesia: General

## 2024-01-13 MED ORDER — PROPOFOL 500 MG/50ML IV EMUL
INTRAVENOUS | Status: DC | PRN
  Administered 2024-01-13: 200 ug/kg/min via INTRAVENOUS
  Administered 2024-01-13: 100 mg via INTRAVENOUS

## 2024-01-13 MED ORDER — LIDOCAINE HCL (CARDIAC) PF 100 MG/5ML IV SOSY
PREFILLED_SYRINGE | INTRAVENOUS | Status: DC | PRN
Start: 2024-01-13 — End: 2024-01-13
  Administered 2024-01-13: 100 mg via INTRAVENOUS

## 2024-01-13 MED ORDER — SODIUM CHLORIDE 0.9 % IV SOLN: INTRAVENOUS | Status: DC

## 2024-01-13 NOTE — Op Note (Signed)
 [facility] Gastroenterology Patient Name: Jennifer [name] Procedure Date: 01/13/2024 8:26 AM MRN: [id number] Account #: [id number] Date of Birth: [birth year] Admit Type: Outpatient Age: 68 Room: [facility] ENDO ROOM 1 Gender: Female Note Status: Finalized Instrument Name: Upper Endoscope [id number] Procedure:             Upper GI endoscopy Indications:           Dysphagia, Gastro-esophageal reflux disease Providers:             Eather Colas MD, MD Referring MD:          Eather Colas MD, MD (Referring MD), Dione Housekeeper (Referring MD) Medicines:             Monitored Anesthesia Care Complications:         No immediate complications. Estimated blood loss:                         Minimal. Procedure:             Pre-Anesthesia Assessment:                        - Prior to the procedure, a History and Physical was                         performed, and patient medications and allergies were                         reviewed. The patient is competent. The risks and                         benefits of the procedure and the sedation options and                         risks were discussed with the patient. All questions                         were answered and informed consent was obtained.                         Patient identification and proposed procedure were                         verified by the physician, the nurse, the                         anesthesiologist, the anesthetist and the technician                         in the endoscopy suite. Mental Status Examination:                         alert and oriented. Airway Examination: normal                         oropharyngeal airway and neck mobility. Respiratory  Examination: clear to auscultation. CV Examination:                         normal. Prophylactic Antibiotics: The patient does not                         require prophylactic antibiotics. Prior                          Anticoagulants: The patient has taken no anticoagulant                         or antiplatelet agents. ASA Grade Assessment: III - A                         patient with severe systemic disease. After reviewing                         the risks and benefits, the patient was deemed in                         satisfactory condition to undergo the procedure. The                         anesthesia plan was to use monitored anesthesia care                         (MAC). Immediately prior to administration of                         medications, the patient was re-assessed for adequacy                         to receive sedatives. The heart rate, respiratory                         rate, oxygen saturations, blood pressure, adequacy of                         pulmonary ventilation, and response to care were                         monitored throughout the procedure. The physical                         status of the patient was re-assessed after the                         procedure.                        After obtaining informed consent, the endoscope was                         passed under direct vision. Throughout the procedure,                         the patient's blood pressure, pulse, and oxygen  saturations were monitored continuously. The                         Endosonoscope was introduced through the mouth, and                         advanced to the second part of duodenum. The upper GI                         endoscopy was accomplished without difficulty. The                         patient tolerated the procedure well. Findings:      Normal mucosa was found in the entire esophagus. Biopsies were obtained       from the proximal and distal esophagus with cold forceps for histology       of suspected eosinophilic esophagitis. Estimated blood loss was minimal.      A hypertonic lower esophageal sphincter was found.      The entire examined  stomach was normal.      The examined duodenum was normal. Impression:            - Normal mucosa was found in the entire esophagus.                        - Hypertonic lower esophageal sphincter.                        - Normal stomach.                        - Normal examined duodenum.                        - Biopsies were taken with a cold forceps for                         evaluation of eosinophilic esophagitis. Recommendation:        - Discharge patient to home.                        - Resume previous diet.                        - Continue present medications.                        - Await pathology results.                        - Return to referring physician as previously                         scheduled. Procedure Code(s):     --- Professional ---                        [phone], Esophagogastroduodenoscopy, flexible,                         transoral; with biopsy, single or multiple Diagnosis Code(s):     --- Professional ---  K22.89, Other specified disease of esophagus                        R13.10, Dysphagia, unspecified                        K21.9, Gastro-esophageal reflux disease without                         esophagitis CPT copyright 2022 American Medical Association. All rights reserved. The codes documented in this report are preliminary and upon coder review may  be revised to meet current compliance requirements. Eather Colas MD, MD 01/13/2024 9:00:40 AM Number of Addenda: 0 Note Initiated On: 01/13/2024 8:26 AM Estimated Blood Loss:  Estimated blood loss was minimal.      [facility]

## 2024-01-13 NOTE — Transfer of Care (Signed)
 Immediate Anesthesia Transfer of Care Note  Patient: Jennifer [name]  Procedure(s) Performed: COLONOSCOPY WITH PROPOFOL ESOPHAGOGASTRODUODENOSCOPY (EGD) WITH PROPOFOL POLYPECTOMY  Patient Location: PACU  Anesthesia Type:General  Level of Consciousness: awake  Airway & Oxygen Therapy: Patient Spontanous Breathing  Post-op Assessment: Report given to RN and Post -op Vital signs reviewed and stable  Post vital signs: Reviewed and stable  Last Vitals:  Vitals Value Taken Time  BP 123/71 01/13/24 0859  Temp    Pulse 66 01/13/24 0859  Resp 17 01/13/24 0859  SpO2 98 % 01/13/24 0859  Vitals shown include unfiled device data.  Last Pain:  Vitals:   01/13/24 0754  TempSrc:   PainSc: 5          Complications: There were no known notable events for this encounter.

## 2024-01-13 NOTE — Interval H&P Note (Signed)
 History and Physical Interval Note:  01/13/2024 8:24 AM  Jennifer [name]  has presented today for surgery, with the diagnosis of GERD,HX OF ADENOMATOUS POLYUP OF COLON.  The various methods of treatment have been discussed with the patient and family. After consideration of risks, benefits and other options for treatment, the patient has consented to  Procedure(s): COLONOSCOPY WITH PROPOFOL (N/A) ESOPHAGOGASTRODUODENOSCOPY (EGD) WITH PROPOFOL (N/A) as a surgical intervention.  The patient's history has been reviewed, patient examined, no change in status, stable for surgery.  I have reviewed the patient's chart and labs.  Questions were answered to the patient's satisfaction.     Regis Bill  Ok to proceed with EGD/Colonoscopy

## 2024-01-13 NOTE — H&P (Signed)
 Outpatient short stay form Pre-procedure 01/13/2024  Regis Bill, MD  Primary Physician: Dione Housekeeper, MD  Reason for visit:  GERD/Surveillance colonoscopy  History of present illness:    68 y/o lady with history of hypertension, hypothyroidism, chronic pain, and DM II here for EGD/Colonoscopy for GERD and history of colon polyps. Last colonoscopy in 2018 with rectal polyp that required transanal resection but was benign. States her brother had colon cancer at 17. History of tummy tuck. No blood thinners.    Current Facility-Administered Medications:    0.9 %  sodium chloride infusion, , Intravenous, Continuous, Ozro Russett, Rossie Muskrat, MD, Last Rate: 20 mL/hr at 01/13/24 0802, New Bag at 01/13/24 0802  Medications Prior to Admission  Medication Sig Dispense Refill Last Dose/Taking   albuterol-ipratropium (COMBIVENT) 18-103 MCG/ACT inhaler Inhale 2 puffs into the lungs daily as needed for wheezing or shortness of breath.    01/12/2024   amLODipine (NORVASC) 10 MG tablet Take 1 tablet (10 mg total) by mouth daily. 90 tablet 3 01/13/2024 Morning   atorvastatin (LIPITOR) 80 MG tablet Take 1 tablet (80 mg total) by mouth daily. 90 tablet 3 01/12/2024   carisoprodol (SOMA) 350 MG tablet Take 1 tablet (350 mg total) by mouth 4 (four) times daily.   Past Week   carvedilol (COREG) 12.5 MG tablet Take 1 tablet (12.5 mg total) by mouth 2 (two) times daily with a meal. 180 tablet 3 01/13/2024 Morning   DETROL LA 4 MG 24 hr capsule Take 4 mg by mouth daily.   01/12/2024   divalproex (DEPAKOTE ER) 500 MG 24 hr tablet Take 2 tablets (1,000 mg total) by mouth daily with supper. Dose change 180 tablet 0 Past Week   ezetimibe (ZETIA) 10 MG tablet Take 10 mg by mouth daily.   Past Week   FLUoxetine (PROZAC) 20 MG capsule Take 1 capsule (20 mg total) by mouth daily. 90 capsule 3 01/12/2024   HUMULIN 70/30 KWIKPEN (70-30) 100 UNIT/ML PEN Inject 10-15 Units into the skin See admin instructions. Inject 22  units SQ in the morning and inject 22 units SQ at bedtime per sliding scale   Past Week   levothyroxine (SYNTHROID) 25 MCG tablet TAKE 1 TABLET ONCE DAILY TAKE ON AN EMPTY STOMACH WITH A GLASS OF WATER AT LEAST 30 TO 60 MINUTES BEFORE BREAKFAST   01/13/2024 Morning   metFORMIN (GLUCOPHAGE-XR) 750 MG 24 hr tablet Take 750 mg by mouth in the morning and at bedtime.   Past Week   omeprazole (PRILOSEC) 20 MG capsule Take 20 mg by mouth daily.   Past Week   sitaGLIPtin (JANUVIA) 100 MG tablet Take 100 mg by mouth daily.   Past Week   telmisartan (MICARDIS) 80 MG tablet Take 1 tablet (80 mg total) by mouth daily. 90 tablet 3 01/12/2024   acetaminophen (TYLENOL) 500 MG tablet Take 1,000 mg by mouth every 6 (six) hours as needed.      Continuous Blood Gluc Receiver (FREESTYLE LIBRE 2 READER) DEVI Use 1 Device as directed      diclofenac Sodium (VOLTAREN) 1 % GEL as needed.      doxepin (SINEQUAN) 10 MG capsule TAKE 1 TO 2 CAPSULES AT BEDTIME AS NEEDED (DOSE REDUCTION) 180 capsule 0    Elastic Bandages & Supports (MEDICAL COMPRESSION STOCKINGS) MISC Please provide compression stockings 1 each 0    fluticasone (FLONASE) 50 MCG/ACT nasal spray Place 1 spray into both nostrils daily as needed.  glucose blood (FREESTYLE LITE) test strip 3 (three) times daily      ibuprofen (ADVIL) 200 MG tablet Take 200 mg by mouth every 6 (six) hours as needed. 400 mg      lidocaine (LIDODERM) 5 % as needed.      Melatonin 10 MG TABS Take 1 tablet by mouth.      naloxone (NARCAN) nasal spray 4 mg/0.1 mL SMARTSIG:Both Nares      nitroGLYCERIN (NITROSTAT) 0.4 MG SL tablet Place 1 tablet (0.4 mg total) under the tongue every 5 (five) minutes as needed for chest pain. 25 tablet 6    Oxycodone HCl 10 MG TABS Take 1 tablet (10 mg total) by mouth every 4 (four) hours as needed. 40 tablet 0    RESTASIS 0.05 % ophthalmic emulsion Place 1 drop into both eyes daily as needed (for dryness).      rOPINIRole (REQUIP) 0.5 MG  tablet Take 1 mg by mouth at bedtime.       SURE COMFORT PEN NEEDLES 31G X 8 MM MISC         Allergies  Allergen Reactions   Aspirin Other (See Comments)     Past Medical History:  Diagnosis Date   Anginal pain (HCC)    Arthritis    Bipolar disorder (HCC)    Blood loss anemia 1985   a.) procedural bleeding during/following cervical Bx --> required PRBC transfusion   CAD (coronary artery disease)    a.) LHC/PCI 10/2005: 2.5 x 28 mm Cypher DES to LAD; b.) LHC/PCI 06/20/2010: 50% pLAD, 90% mLAD (2.5 x 15 mm Xience V DES), 60% D1, 50% pRCA; c.) LHC 08/06/2011: 95% ISR mLAD, 70% D1, 90% mRCA --> refer to CVTS; d.) 3v CABG 08/12/2011; e.) LHC 06/03/2013: 100% ISR mLAD, 40% mRCA, 100% oSVG-RCA - med mgmt   Cervical dysplasia    Chronic pain syndrome    a.) followed by pain management/psychiatry   Chronic, continuous use of opioids    a.) followed by pain management/psychiatry; b.) has naloxone Rx available   COPD (chronic obstructive pulmonary disease) (HCC)    CRD (chronic renal disease)    Depression    Dyspnea on exertion    Enlarged liver    Family history of adverse reaction to anesthesia    a.) delayed emergence in 1st degree relative (father)   Fibromyalgia    Generalized anxiety disorder    GERD (gastroesophageal reflux disease)    Hyperlipidemia    Hypertension    Hypothyroidism    Low back pain    Low sodium levels    Migraines    Neuropathy    Persistent disorder of initiating or maintaining sleep    a.) uses melatonin + carisoprodol   Pneumonia    Primary cancer of skin of ear    Restless leg syndrome    a.) on ropinirole   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    Physical Exam  Gen: Alert, oriented. Appears stated age.  HEENT: PERRLA. Lungs: No respiratory distress CV: RRR Abd: soft, benign, no masses Ext: No edema  Planned procedures: Proceed with colonoscopy. The patient understands the nature of the planned procedure, indications, risks, alternatives and potential complications including but not limited to bleeding, infection, perforation, damage to internal organs and possible oversedation/side effects from anesthesia. The patient agrees and gives consent to proceed.  Please refer to procedure notes for findings, recommendations and patient disposition/instructions.     Regis Bill, MD [facility]

## 2024-01-13 NOTE — Anesthesia Preprocedure Evaluation (Signed)
 Anesthesia Evaluation  Patient identified by MRN, date of birth, ID band Patient awake    Reviewed: Allergy & Precautions, NPO status , Patient's Chart, lab work & pertinent test results  History of Anesthesia Complications Negative for: history of anesthetic complications  Airway Mallampati: III  TM Distance: <3 FB Neck ROM: full    Dental  (+) Chipped, Poor Dentition   Pulmonary shortness of breath and with exertion, COPD, former smoker   Pulmonary exam normal        Cardiovascular Exercise Tolerance: Good hypertension, (-) angina + CAD and + Peripheral Vascular Disease  Normal cardiovascular exam     Neuro/Psych  Headaches  Neuromuscular disease  negative psych ROS   GI/Hepatic Neg liver ROS,GERD  Controlled,,  Endo/Other  negative endocrine ROSdiabetes    Renal/GU Renal disease  negative genitourinary   Musculoskeletal   Abdominal   Peds  Hematology negative hematology ROS (+)   Anesthesia Other Findings Past Medical History: No date: Anginal pain (HCC) No date: Arthritis No date: Bipolar disorder (HCC) 1985: Blood loss anemia     Comment:  a.) procedural bleeding during/following cervical Bx -->              required PRBC transfusion No date: CAD (coronary artery disease)     Comment:  a.) LHC/PCI 10/2005: 2.5 x 28 mm Cypher DES to LAD; b.)               LHC/PCI 06/20/2010: 50% pLAD, 90% mLAD (2.5 x 15 mm               Xience V DES), 60% D1, 50% pRCA; c.) LHC 08/06/2011: 95%               ISR mLAD, 70% D1, 90% mRCA --> refer to CVTS; d.) 3v CABG              08/12/2011; e.) LHC 06/03/2013: 100% ISR mLAD, 40% mRCA,               100% oSVG-RCA - med mgmt No date: Cervical dysplasia No date: Chronic pain syndrome     Comment:  a.) followed by pain management/psychiatry No date: Chronic, continuous use of opioids     Comment:  a.) followed by pain management/psychiatry; b.) has               naloxone Rx  available No date: COPD (chronic obstructive pulmonary disease) (HCC) No date: CRD (chronic renal disease) No date: Depression No date: Dyspnea on exertion No date: Enlarged liver No date: Family history of adverse reaction to anesthesia     Comment:  a.) delayed emergence in 1st degree relative (father) No date: Fibromyalgia No date: Generalized anxiety disorder No date: GERD (gastroesophageal reflux disease) No date: Hyperlipidemia No date: Hypertension No date: Hypothyroidism No date: Low back pain No date: Low sodium levels No date: Migraines No date: Neuropathy No date: Persistent disorder of initiating or maintaining sleep     Comment:  a.) uses melatonin + carisoprodol No date: Pneumonia No date: Primary cancer of skin of ear No date: Restless leg syndrome     Comment:  a.) on ropinirole 08/12/2011: S/P CABG x 3     Comment:  a.) LIMA-LAD, SVG-RCA, SVG-D1 No date: Type 2 diabetes mellitus treated with insulin (HCC) No date: Von Willebrand disease (HCC)     Comment:  a.) VWD workup normal in 2018; DOES NOT have primary  bleeding disorder; b.) has O positive blood group (can               have mildly low levels of VWF), hence why patient may               have been told she has VWD  Past Surgical History: 12/10/2016: ANTERIOR LAT LUMBAR FUSION; N/A     Comment:  Procedure: LUMBAR FOUR-FIVE  Anterior lateral lumbar               interbody fusion with LUMBAR FOUR-FIVE  Posterolateral               fusion/Re-operative laminectomy LUMBAR FIVE-SACRUM ONE               Bilateral, Laminectomy at LUMBAR FOUR-FIVE , excision of               extradural mass right LUMBAR FIVE-SACRUM ONE  and Left               LUMBAR FOUR-FIVE  with Mazor;  Surgeon: Loura Halt               Ditty, MD;  Location: [facility] OR;  Service: Neurosurgery;                Laterality: N/A; 12/10/2016: APPLICATION OF ROBOTIC ASSISTANCE FOR SPINAL PROCEDURE; N/ A     Comment:  Procedure:  APPLICATION OF ROBOTIC ASSISTANCE FOR SPINAL               PROCEDURE;  Surgeon: Loura Halt Ditty, MD;  Location:              [facility] OR;  Service: Neurosurgery;  Laterality: N/A; 1993: BREAST IMPLANT REMOVAL; Bilateral 08/13/2018: COLONOSCOPY WITH PROPOFOL; N/A     Comment:  Procedure: COLONOSCOPY WITH PROPOFOL;  Surgeon: Wyline Mood, MD;  Location: [facility] ENDOSCOPY;  Service:               Gastroenterology;  Laterality: N/A; 10/2005: CORONARY ANGIOPLASTY WITH STENT PLACEMENT; Left     Comment:  Procedure: CORONARY ANGIOPLASTY WITH STENT PLACEMENT 06/20/2010: CORONARY ANGIOPLASTY WITH STENT PLACEMENT; Left     Comment:  Procedure: CORONARY ANGIOPLASTY WITH STENT PLACEMENT;               Location: [facility]; Surgeon: Julien Nordmann, MD 08/12/2011: CORONARY ARTERY BYPASS GRAFT     Comment:  Procedure: CORONARY ARTERY BYPASS GRAFT; Location: [facility]; Surgeon: Charlett Lango, MD 08/13/2018: ESOPHAGOGASTRODUODENOSCOPY (EGD) WITH PROPOFOL; N/A     Comment:  Procedure: ESOPHAGOGASTRODUODENOSCOPY (EGD) WITH               PROPOFOL;  Surgeon: Wyline Mood, MD;  Location: [facility]               ENDOSCOPY;  Service: Gastroenterology;  Laterality: N/A; 08/06/2011: LEFT HEART CATH AND CORONARY ANGIOGRAPHY; Left     Comment:  Procedure: LEFT HEART CATH AND CORONARY ANGIOGRAPHY;               Location: [facility]; Surgeon: Julien Nordmann, MD 06/03/2013: LEFT HEART CATH AND CORS/GRAFTS ANGIOGRAPHY; Left     Comment:  Procedure: LEFT HEART CATH AND CORS/GRAFTS ANGIOGRAPHY;               Location: [facility]; Surgeon: Julien Nordmann, MD 2007: LUMBAR FUSION     Comment:  L1-S1 12/10/2016: LUMBAR PERCUTANEOUS PEDICLE SCREW 1 LEVEL; N/A     Comment:  Procedure: Extension of pedicle screw fixation to LUMBAR              FOUR;  Surgeon: Loura Halt Ditty, MD;  Location: [facility]               OR;  Service: Neurosurgery;  Laterality: N/A; 1987: PLACEMENT OF BREAST IMPLANTS; Bilateral 10/06/2018:  RECTAL EXAM UNDER ANESTHESIA; N/A     Comment:  Procedure: RECTAL EXAM UNDER ANESTHESIA;  Surgeon:               Leafy Ro, MD;  Location: [facility] ORS;  Service:               General;  Laterality: N/A; 12/19/2022: SHOULDER ARTHROSCOPY WITH SUBACROMIAL DECOMPRESSION,  ROTATOR CUFF REPAIR AND BICEP TENDON REPAIR; Right     Comment:  Procedure: SHOULDER ARTHROSCOPY WITH DEBRIDEMENT,               DECOMPRESSION, ROTATOR CUFF REPAIR AND BICEP TENDON               REPAIR;  Surgeon: Christena Flake, MD;  Location: [facility] ORS;              Service: Orthopedics;  Laterality: Right; 1985: TOTAL ABDOMINAL HYSTERECTOMY W/ BILATERAL SALPINGOOPHORECTOMY;  N/A 1997: TURBINATE REDUCTION  BMI    Body Mass Index: 27.46 kg/m      Reproductive/Obstetrics negative OB ROS                             Anesthesia Physical Anesthesia Plan  ASA: 3  Anesthesia Plan: General   Post-op Pain Management:    Induction: Intravenous  PONV Risk Score and Plan: Propofol infusion and TIVA  Airway Management Planned: Natural Airway and Nasal Cannula  Additional Equipment:   Intra-op Plan:   Post-operative Plan:   Informed Consent: I have reviewed the patients History and Physical, chart, labs and discussed the procedure including the risks, benefits and alternatives for the proposed anesthesia with the patient or authorized representative who has indicated his/her understanding and acceptance.     Dental Advisory Given  Plan Discussed with: Anesthesiologist, CRNA and Surgeon  Anesthesia Plan Comments: (Patient consented for risks of anesthesia including but not limited to:  - adverse reactions to medications - risk of airway placement if required - damage to eyes, teeth, lips or other oral mucosa - nerve damage due to positioning  - sore throat or hoarseness - Damage to heart, brain, nerves, lungs, other parts of body or loss of life  Patient voiced understanding and  assent.)       Anesthesia Quick Evaluation

## 2024-01-13 NOTE — Anesthesia Postprocedure Evaluation (Signed)
 Anesthesia Post Note  Patient: Jennifer [name]  Procedure(s) Performed: COLONOSCOPY WITH PROPOFOL ESOPHAGOGASTRODUODENOSCOPY (EGD) WITH PROPOFOL POLYPECTOMY  Patient location during evaluation: Endoscopy Anesthesia Type: General Level of consciousness: awake and alert Pain management: pain level controlled Vital Signs Assessment: post-procedure vital signs reviewed and stable Respiratory status: spontaneous breathing, nonlabored ventilation, respiratory function stable and patient connected to nasal cannula oxygen Cardiovascular status: blood pressure returned to baseline and stable Postop Assessment: no apparent nausea or vomiting Anesthetic complications: no   There were no known notable events for this encounter.   Last Vitals:  Vitals:   01/13/24 0909 01/13/24 0919  BP: 121/72   Pulse:    Resp:    Temp:    SpO2: 99% 99%    Last Pain:  Vitals:   01/13/24 0919  TempSrc:   PainSc: 0-No pain                 Cleda Mccreedy Apple Dearmas

## 2024-01-13 NOTE — Op Note (Signed)
 [facility] Patient Name: Jennifer [name] Procedure Date: 01/13/2024 8:25 AM MRN: [id number] Account #: [id number] Date of Birth: [birth year] Admit Type: Outpatient Age: 68 Room: [facility] ENDO ROOM 1 Gender: Female Note Status: Finalized Instrument Name: Prentice Docker [id number] Procedure:             Colonoscopy Indications:           Surveillance: Personal history of adenomatous polyps                         on last colonoscopy > 5 years ago Providers:             Eather Colas MD, MD Referring MD:          Eather Colas MD, MD (Referring MD), Nat Christen.                         Zada Finders, MD (Referring MD) Medicines:             Monitored Anesthesia Care Complications:         No immediate complications. Estimated blood loss:                         Minimal. Procedure:             Pre-Anesthesia Assessment:                        - Prior to the procedure, a History and Physical was                         performed, and patient medications and allergies were                         reviewed. The patient is competent. The risks and                         benefits of the procedure and the sedation options and                         risks were discussed with the patient. All questions                         were answered and informed consent was obtained.                         Patient identification and proposed procedure were                         verified by the physician, the nurse, the                         anesthesiologist, the anesthetist and the technician                         in the endoscopy suite. Mental Status Examination:                         alert and oriented. Airway Examination: normal  oropharyngeal airway and neck mobility. Respiratory                         Examination: clear to auscultation. CV Examination:                         normal. Prophylactic Antibiotics: The patient does not                          require prophylactic antibiotics. Prior                         Anticoagulants: The patient has taken no anticoagulant                         or antiplatelet agents. ASA Grade Assessment: III - A                         patient with severe systemic disease. After reviewing                         the risks and benefits, the patient was deemed in                         satisfactory condition to undergo the procedure. The                         anesthesia plan was to use monitored anesthesia care                         (MAC). Immediately prior to administration of                         medications, the patient was re-assessed for adequacy                         to receive sedatives. The heart rate, respiratory                         rate, oxygen saturations, blood pressure, adequacy of                         pulmonary ventilation, and response to care were                         monitored throughout the procedure. The physical                         status of the patient was re-assessed after the                         procedure.                        After obtaining informed consent, the colonoscope was                         passed under direct vision. Throughout the procedure,  the patient's blood pressure, pulse, and oxygen                         saturations were monitored continuously. The                         Colonoscope was introduced through the anus and                         advanced to the the cecum, identified by appendiceal                         orifice and ileocecal valve. The colonoscopy was                         performed without difficulty. The patient tolerated                         the procedure well. The quality of the bowel                         preparation was good. The ileocecal valve, appendiceal                         orifice, and rectum were photographed. Findings:      The perianal and digital rectal  examinations were normal.      Two sessile polyps were found in the transverse colon. The polyps were 3       to 4 mm in size. These polyps were removed with a cold snare. Resection       and retrieval were complete. Estimated blood loss was minimal.      There was a medium-sized lipoma, in the mid transverse colon.      A few small-mouthed diverticula were found in the sigmoid colon.      Internal hemorrhoids were found during retroflexion. The hemorrhoids       were Grade I (internal hemorrhoids that do not prolapse).      The exam was otherwise without abnormality on direct and retroflexion       views. Impression:            - Two 3 to 4 mm polyps in the transverse colon,                         removed with a cold snare. Resected and retrieved.                        - Medium-sized lipoma in the mid transverse colon.                        - Diverticulosis in the sigmoid colon.                        - Internal hemorrhoids.                        - The examination was otherwise normal on direct and                         retroflexion views. Recommendation:        -  Discharge patient to home.                        - Resume previous diet.                        - Continue present medications.                        - Await pathology results.                        - Repeat colonoscopy in 5 years for surveillance.                        - Return to referring physician as previously                         scheduled. Procedure Code(s):     --- Professional ---                        [phone], Colonoscopy, flexible; with removal of                         tumor(s), polyp(s), or other lesion(s) by snare                         technique Diagnosis Code(s):     --- Professional ---                        Z86.010, Personal history of colonic polyps                        D12.3, Benign neoplasm of transverse colon (hepatic                         flexure or splenic flexure)                         D17.5, Benign lipomatous neoplasm of intra-abdominal                         organs                        K64.0, First degree hemorrhoids                        K57.30, Diverticulosis of large intestine without                         perforation or abscess without bleeding CPT copyright 2022 [employer]. All rights reserved. The codes documented in this report are preliminary and upon coder review may  be revised to meet current compliance requirements. Eather Colas MD, MD 01/13/2024 9:05:35 AM Number of Addenda: 0 Note Initiated On: 01/13/2024 8:25 AM Scope Withdrawal Time: 0 hours 9 minutes 19 seconds  Total Procedure Duration: 0 hours 14 minutes 11 seconds  Estimated Blood Loss:  Estimated blood loss was minimal.      [facility]

## 2024-01-14 LAB — SURGICAL PATHOLOGY

## 2024-07-05 NOTE — Progress Notes (Unsigned)
 ID: Jennifer [name], female   DOB: [birth year], 68 y.o.   MRN: [id number] Cardiology Office Note  Date:  07/06/2024   [name], DOB 07/11/1956, MRN [id number]  PCP:  Eliverto Bette Hover, MD   Chief Complaint  Patient presents with   12 month follow up     Patient c/o no energy, shortness of breath and fluttering in chest at times.     HPI:  68 year old woman with a history of  coronary artery disease, PTCA of the LAD with 2.5 x 28 mm Cypher stent in January 2007,  catheterization August 2011 with Stent placement of the mid LAD for 90% lesion,  catheterization August 06, 2011 for chest pain that showed severe mid LAD in-stent restenosis at the site of the previous drug-eluting stent ( DES stent x2 placed in the LAD in 2007 and 2011), also with severe mid RCA disease, moderate to severe proximal diagonal #1 disease, ejection fraction 55%. bypass surgery at [facility]  on August 12 2011 by Dr. Kerrin. bypass graft x3 with a LIMA to the LAD, vein graft to the RCA, vein graft to the diagonal. Chronic opioid use Chronic fatigue She presents for routine follow-up of her coronary artery disease  Last seen in clinic by myself 9/23 On today's visit she reports she took Coreg , telmisartan , amlodipine  before coming into the office on empty stomach Blood pressure running low, initial check 65 systolic, on my recheck 85 systolic Relatively asymptomatic but she does report having occasional orthostasis episodes  Occasional chest fluttering lasting for several seconds at a time  Followed by pain clinic in [place], psychiatry Chronic pain Right arm pain/posterior shoulder, wrist  Non-smoker, active Continues on Lipitor  80 daily and Zetia  Lab work reviewed A1c 7.9 Total chol 94, LDL 22  EKG personally reviewed by myself on todays visit EKG Interpretation Date/Time:  Tuesday July 06 2024 13:52:58 EDT Ventricular Rate:  71 PR Interval:  136 QRS Duration:  74 QT  Interval:  424 QTC Calculation: 460 R Axis:   57  Text Interpretation: Normal sinus rhythm When compared with ECG of 07-Jul-2023 12:43, No significant change was found Confirmed by Perla Lye [phone]) on 07/06/2024 1:57:47 PM    Past medical history reviewed, Previously reported chronic anxiety, stress at home, confusion at times. Treated by a psychiatrist in [place] , started on Valium.  She is tired all time, sleeping a good number of hours per day. In the past she has taken benzodiazepines,  Prozac .  previously on Lamictal and Cymbalta,  She sees Dr. Gala, also sees Dr. Jerone in [place] . She does travel up to [place]  to help family .     cardiac catheterization 06/03/2013. 100% occluded mid LAD at the site of a stent, mild mid RCA disease, saphenous vein graft to the diagonal and LIMA graft to the LAD are patent. Occluded saphenous vein graft to the distal RCA. Medical management was recommended.   PMH:   has a past medical history of Anginal pain (HCC), Arthritis, Bipolar disorder (HCC), Blood loss anemia (1985), CAD (coronary artery disease), Cervical dysplasia, Chronic pain syndrome, Chronic, continuous use of opioids, COPD (chronic obstructive pulmonary disease) (HCC), CRD (chronic renal disease), Depression, Dyspnea on exertion, Enlarged liver, Family history of adverse reaction to anesthesia, Fibromyalgia, Generalized anxiety disorder, GERD (gastroesophageal reflux disease), Hyperlipidemia, Hypertension, Hypothyroidism, Low back pain, Low sodium levels, Migraines, Neuropathy, Persistent disorder of initiating or maintaining sleep, Pneumonia, Primary cancer of skin of ear, Restless leg syndrome, 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  PSH:    Past Surgical History:  Procedure Laterality Date   ANTERIOR LAT LUMBAR FUSION N/A 12/10/2016   Procedure: LUMBAR FOUR-FIVE  Anterior lateral lumbar interbody fusion with  LUMBAR FOUR-FIVE  Posterolateral fusion/Re-operative laminectomy LUMBAR FIVE-SACRUM ONE Bilateral, Laminectomy at LUMBAR FOUR-FIVE , 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  and Left LUMBAR FOUR-FIVE  with Mazor;  Surgeon: Morene Hicks Ditty, MD;  Location: [facility] OR;  Service: Neurosurgery;  Laterality: N/A;   APPLICATION OF ROBOTIC ASSISTANCE FOR SPINAL PROCEDURE N/A 12/10/2016   Procedure: APPLICATION OF ROBOTIC ASSISTANCE FOR SPINAL PROCEDURE;  Surgeon: Morene Hicks Ditty, MD;  Location: [facility] OR;  Service: Neurosurgery;  Laterality: N/A;   BREAST IMPLANT REMOVAL Bilateral 1993   COLONOSCOPY WITH PROPOFOL  N/A 08/13/2018   Procedure: COLONOSCOPY WITH PROPOFOL ;  Surgeon: Therisa Bi, MD;  Location: [facility] ENDOSCOPY;  Service: Gastroenterology;  Laterality: N/A;   COLONOSCOPY WITH PROPOFOL  N/A 01/13/2024   Procedure: COLONOSCOPY WITH PROPOFOL ;  Surgeon: Maryruth Ole DASEN, MD;  Location: [facility] ENDOSCOPY;  Service: Endoscopy;  Laterality: N/A;   CORONARY ANGIOPLASTY WITH STENT PLACEMENT Left 10/2005   Procedure: CORONARY ANGIOPLASTY WITH STENT PLACEMENT   CORONARY ANGIOPLASTY WITH STENT PLACEMENT Left 06/20/2010   Procedure: CORONARY ANGIOPLASTY WITH STENT PLACEMENT; Location: [facility]; Surgeon: Evalene Lunger, MD   CORONARY ARTERY BYPASS GRAFT  08/12/2011   Procedure: CORONARY ARTERY BYPASS GRAFT; Location: [place]; Surgeon: Elspeth Millers, MD   ESOPHAGOGASTRODUODENOSCOPY (EGD) WITH PROPOFOL  N/A 08/13/2018   Procedure: ESOPHAGOGASTRODUODENOSCOPY (EGD) WITH PROPOFOL ;  Surgeon: Therisa Bi, MD;  Location[facility] ENDOSCOPY;  Service: Gastroenterology;  Laterality: N/A;   ESOPHAGOGASTRODUODENOSCOPY (EGD) WITH PROPOFOL  N/A 01/13/2024   Procedure: ESOPHAGOGASTRODUODENOSCOPY (EGD) WITH PROPOFOL ;  Surgeon: Maryruth Ole DASEN, MD;  Location: [facility] ENDOSCOPY;  Service: Endoscopy;  Laterality: N/A;   LEFT HEART CATH AND CORONARY ANGIOGRAPHY Left 08/06/2011   Procedure: LEFT HEART CATH AND  CORONARY ANGIOGRAPHY; Location: [facility]; Surgeon: Evalene Lunger, MD   LEFT HEART CATH AND CORS/GRAFTS ANGIOGRAPHY Left 06/03/2013   Procedure: LEFT HEART CATH AND CORS/GRAFTS ANGIOGRAPHY; Location: [facility]; Surgeon: Evalene Lunger, MD   LUMBAR FUSION  2007   L1-S1   LUMBAR PERCUTANEOUS PEDICLE SCREW 1 LEVEL N/A 12/10/2016   Procedure: Extension of pedicle screw fixation to LUMBAR FOUR;  Surgeon: Morene Hicks Ditty, MD;  Location: [facility] OR;  Service: Neurosurgery;  Laterality: N/A;   PLACEMENT OF BREAST IMPLANTS Bilateral 1987   POLYPECTOMY  01/13/2024   Procedure: POLYPECTOMY;  Surgeon: Maryruth Ole DASEN, MD;  Location: [facility] ENDOSCOPY;  Service: Endoscopy;;   RECTAL EXAM UNDER ANESTHESIA N/A 10/06/2018   Procedure: RECTAL EXAM UNDER ANESTHESIA;  Surgeon: Jordis Laneta FALCON, MD;  Location: [facility] ORS;  Service: General;  Laterality: N/A;   SHOULDER ARTHROSCOPY WITH SUBACROMIAL DECOMPRESSION, ROTATOR CUFF REPAIR AND BICEP TENDON REPAIR Right 12/19/2022   Procedure: SHOULDER ARTHROSCOPY WITH DEBRIDEMENT, DECOMPRESSION, ROTATOR CUFF REPAIR AND BICEP TENDON REPAIR;  Surgeon: Edie Norleen PARAS, MD;  Location: [facility] ORS;  Service: Orthopedics;  Laterality: Right;   TOTAL ABDOMINAL HYSTERECTOMY W/ BILATERAL SALPINGOOPHORECTOMY N/A 1985   TURBINATE REDUCTION  1997    Current Outpatient Medications  Medication Sig Dispense Refill   acetaminophen  (TYLENOL ) 500 MG tablet Take 1,000 mg by mouth every 6 (six) hours as needed.     albuterol  (VENTOLIN  HFA) 108 (90 Base) MCG/ACT inhaler Inhale 2 puffs into the lungs every 4 (four) hours as needed.     albuterol -ipratropium (COMBIVENT)  18-103 MCG/ACT inhaler Inhale 2 puffs into the lungs daily as needed for wheezing or shortness of breath.      amLODipine  (NORVASC ) 10 MG tablet Take 1 tablet (10 mg total) by mouth daily. 90 tablet 3   atorvastatin  (LIPITOR ) 80 MG tablet Take 1 tablet (80 mg total) by mouth daily. 90 tablet 3   carisoprodol  (SOMA ) 350 MG tablet Take 1 tablet  (350 mg total) by mouth 4 (four) times daily.     carvedilol  (COREG ) 12.5 MG tablet Take 1 tablet (12.5 mg total) by mouth 2 (two) times daily with a meal. 180 tablet 3   Continuous Blood Gluc Receiver (FREESTYLE LIBRE 2 READER) DEVI Use 1 Device as directed     DETROL LA 4 MG 24 hr capsule Take 4 mg by mouth daily.     diclofenac Sodium (VOLTAREN) 1 % GEL as needed.     Elastic Bandages & Supports (MEDICAL COMPRESSION STOCKINGS) MISC Please provide compression stockings 1 each 0   ezetimibe (ZETIA) 10 MG tablet Take 10 mg by mouth daily.     FLUoxetine  (PROZAC ) 20 MG capsule Take 1 capsule (20 mg total) by mouth daily. 90 capsule 3   fluticasone  (FLONASE ) 50 MCG/ACT nasal spray Place 1 spray into both nostrils daily as needed.      glucose blood (FREESTYLE LITE) test strip 3 (three) times daily     HUMULIN 70/30 KWIKPEN (70-30) 100 UNIT/ML PEN Inject 10-15 Units into the skin See admin instructions. Inject 22 units SQ in the morning and inject 22 units SQ at bedtime per sliding scale     ibuprofen (ADVIL) 200 MG tablet Take 200 mg by mouth every 6 (six) hours as needed. 400 mg     levothyroxine  (SYNTHROID ) 25 MCG tablet TAKE 1 TABLET ONCE DAILY TAKE ON AN EMPTY STOMACH WITH A GLASS OF WATER AT LEAST 30 TO 60 MINUTES BEFORE BREAKFAST     lidocaine  (LIDODERM ) 5 % as needed.     Melatonin 10 MG TABS Take 1 tablet by mouth.     metFORMIN  (GLUCOPHAGE -XR) 750 MG 24 hr tablet Take 750 mg by mouth in the morning and at bedtime.     naloxone (NARCAN) nasal spray 4 mg/0.1 mL SMARTSIG:Both Nares     nitroGLYCERIN  (NITROSTAT ) 0.4 MG SL tablet Place 1 tablet (0.4 mg total) under the tongue every 5 (five) minutes as needed for chest pain. 25 tablet 6   NUCYNTA ER 50 MG 12 hr tablet Take 50 mg by mouth every 12 (twelve) hours.     omeprazole (PRILOSEC) 20 MG capsule Take 20 mg by mouth daily.     Oxycodone  HCl 10 MG TABS Take 1 tablet (10 mg total) by mouth every 4 (four) hours as needed. 40 tablet 0    pregabalin  (LYRICA ) 75 MG capsule Take 75 mg by mouth 3 (three) times daily.     RESTASIS  0.05 % ophthalmic emulsion Place 1 drop into both eyes daily as needed (for dryness).     rOPINIRole  (REQUIP ) 0.5 MG tablet Take 1 mg by mouth at bedtime.      sitaGLIPtin (JANUVIA) 100 MG tablet Take 100 mg by mouth daily.     SURE COMFORT PEN NEEDLES 31G X 8 MM MISC      telmisartan  (MICARDIS ) 80 MG tablet Take 1 tablet (80 mg total) by mouth daily. 90 tablet 3   divalproex  (DEPAKOTE  ER) 500 MG 24 hr tablet Take 2 tablets (1,000 mg total) by mouth daily with  supper. Dose change (Patient not taking: Reported on 07/06/2024) 180 tablet 0   doxepin  (SINEQUAN ) 10 MG capsule TAKE 1 TO 2 CAPSULES AT BEDTIME AS NEEDED (DOSE REDUCTION) (Patient not taking: Reported on 07/06/2024) 180 capsule 0   No current facility-administered medications for this visit.    Allergies:   Aspirin    Social History:  The patient  reports that she quit smoking about 14 years ago. Her smoking use included cigarettes. She started smoking about 30 years ago. She has never used smokeless tobacco. She reports that she does not drink alcohol and does not use drugs.   Family History:   family history includes Alcohol abuse in her brother, father, and mother; Aneurysm in her brother and father; Colon cancer in her brother; Diabetes in her father, mother, and another family member; Hyperlipidemia in an other family member; Hypertension in her brother, father, and another family member.    Review of Systems: Review of Systems  Constitutional: Negative.   HENT: Negative.    Respiratory: Negative.    Cardiovascular: Negative.   Gastrointestinal: Negative.   Musculoskeletal: Negative.   Neurological: Negative.   Psychiatric/Behavioral: Negative.    All other systems reviewed and are negative.  PHYSICAL EXAM: VS:  Ht 5' 3 (1.6 m)   Wt 154 lb 8 oz (70.1 kg)   SpO2 95%   BMI 27.37 kg/m  , BMI Body mass index is 27.37 kg/m.   Constitutional:  oriented to person, place, and time. No distress.  HENT:  Head: Normocephalic and atraumatic.  Eyes:  no discharge. No scleral icterus.  Neck: Normal range of motion. Neck supple. No JVD present.  Cardiovascular: Normal rate, regular rhythm, normal heart sounds and intact distal pulses. Exam reveals no gallop and no friction rub. No edema No murmur heard. Pulmonary/Chest: Effort normal and breath sounds normal. No stridor. No respiratory distress.  no wheezes.  no rales.  no tenderness.  Abdominal: Soft.  no distension.  no tenderness.  Musculoskeletal: Normal range of motion.  no  tenderness or deformity.  Neurological:  normal muscle tone. Coordination normal. No atrophy Skin: Skin is warm and dry. No rash noted. not diaphoretic.  Psychiatric:  normal mood and affect. behavior is normal. Thought content normal.   Recent Labs: 08/22/2023: ALT 15; Platelets 266 10/09/2023: Sodium 134    Lipid Panel Lab Results  Component Value Date   CHOL 139 07/26/2022   HDL 38 (L) 07/26/2022   LDLCALC 54 07/26/2022   TRIG 237 (H) 07/26/2022      Wt Readings from Last 3 Encounters:  07/06/24 154 lb 8 oz (70.1 kg)  01/13/24 155 lb (70.3 kg)  07/07/23 149 lb (67.6 kg)     ASSESSMENT AND PLAN:  Coronary Artery disease with chronic stable Chronic chest pain symptoms often exacerbated by stress. No further workup at this time. Continue current medication regimen.  Hyperlipidemia Continue Lipitor  with Zetia daily Cholesterol at goal  SMOKER Vaps, nonsmoker for years , since CABG  Type 2 diabetes mellitus with other circulatory complication (HCC) Long history poorly controlled diabetes  Managed by primary care Weight continues to run high  Depression/anxiety Followed by psychiatry, pain clinic  Hypotension Reports taking 3 of her blood pressure medications on the way into the office on an empty stomach Recommend she take telmisartan  and Coreg  in the morning with  food Reduce amlodipine  down to 5 mg every evening and Coreg  in the evening with food Monitor blood pressure at home and call us  if numbers  continue to run low    Signed, Velinda Lunger, M.D., Ph.D. 07/06/2024  [facility] [place], [place] [phone]

## 2024-07-06 ENCOUNTER — Ambulatory Visit: Attending: Cardiovascular Disease | Admitting: Cardiovascular Disease

## 2024-07-06 ENCOUNTER — Encounter: Payer: Self-pay | Admitting: Cardiovascular Disease

## 2024-07-06 VITALS — BP 84/50 | Ht 63.0 in | Wt 154.5 lb

## 2024-07-06 DIAGNOSIS — E782 Mixed hyperlipidemia: Secondary | ICD-10-CM | POA: Diagnosis not present

## 2024-07-06 DIAGNOSIS — E1169 Type 2 diabetes mellitus with other specified complication: Secondary | ICD-10-CM

## 2024-07-06 DIAGNOSIS — E785 Hyperlipidemia, unspecified: Secondary | ICD-10-CM

## 2024-07-06 DIAGNOSIS — I25118 Atherosclerotic heart disease of native coronary artery with other forms of angina pectoris: Secondary | ICD-10-CM | POA: Diagnosis not present

## 2024-07-06 DIAGNOSIS — I10 Essential (primary) hypertension: Secondary | ICD-10-CM

## 2024-07-06 DIAGNOSIS — I739 Peripheral vascular disease, unspecified: Secondary | ICD-10-CM

## 2024-07-06 DIAGNOSIS — E1159 Type 2 diabetes mellitus with other circulatory complications: Secondary | ICD-10-CM

## 2024-07-06 DIAGNOSIS — F17200 Nicotine dependence, unspecified, uncomplicated: Secondary | ICD-10-CM

## 2024-07-06 MED ORDER — AMLODIPINE BESYLATE 5 MG PO TABS: 5.0000 mg | ORAL_TABLET | Freq: Every evening | ORAL | 3 refills | Status: AC

## 2024-07-06 NOTE — Patient Instructions (Addendum)
 Medication Instructions:   Please decrease the amlodipine  down to 5 mg daily in the evening  Take coreg  twice a day Take telmisartan  with food in the morning  Please monitor blood pressure at home.    If you need a refill on your cardiac medications before your next appointment, please call your pharmacy.   Lab work: No new labs needed  Testing/Procedures: No new testing needed  Follow-Up: At [facility], you and your health needs are our priority.  As part of our continuing mission to provide you with exceptional heart care, we have created designated Provider Care Teams.  These Care Teams include your primary Cardiologist (physician) and Advanced Practice Providers (APPs -  Physician Assistants and Nurse Practitioners) who all work together to provide you with the care you need, when you need it.  You will need a follow up appointment in 6 months, APP ok  Providers on your designated Care Team:   Lonni Meager, NP Bernardino Bring, PA-C Cadence [place], [place]  COVID-19 Vaccine Information can be found at: [email or web address] For questions related to vaccine distribution or appointments, please email [email or web address] or call [phone].

## 2024-10-12 ENCOUNTER — Other Ambulatory Visit: Payer: Self-pay | Admitting: Psychiatry

## 2024-10-12 ENCOUNTER — Telehealth: Payer: Self-pay | Admitting: Psychiatry

## 2024-10-12 DIAGNOSIS — F411 Generalized anxiety disorder: Secondary | ICD-10-CM

## 2024-10-12 NOTE — Telephone Encounter (Signed)
 Received a refill request for this patient and I have not seen this patient since the past 1 year.  Will have staff contact to schedule a follow-up appointment for any future refills.
